# Patient Record
Sex: Male | Born: 1955
Health system: Southern US, Community
[De-identification: ages and names within clinical notes are randomized; demographics above are authoritative.]

## PROBLEM LIST (undated history)

## (undated) DIAGNOSIS — I6529 Occlusion and stenosis of unspecified carotid artery: Secondary | ICD-10-CM

## (undated) DIAGNOSIS — M1990 Unspecified osteoarthritis, unspecified site: Secondary | ICD-10-CM

## (undated) DIAGNOSIS — IMO0002 Reserved for concepts with insufficient information to code with codable children: Secondary | ICD-10-CM

## (undated) DIAGNOSIS — E43 Unspecified severe protein-calorie malnutrition: Secondary | ICD-10-CM

## (undated) DIAGNOSIS — E785 Hyperlipidemia, unspecified: Secondary | ICD-10-CM

## (undated) DIAGNOSIS — J189 Pneumonia, unspecified organism: Secondary | ICD-10-CM

## (undated) DIAGNOSIS — R011 Cardiac murmur, unspecified: Secondary | ICD-10-CM

## (undated) DIAGNOSIS — I2510 Atherosclerotic heart disease of native coronary artery without angina pectoris: Secondary | ICD-10-CM

## (undated) DIAGNOSIS — K259 Gastric ulcer, unspecified as acute or chronic, without hemorrhage or perforation: Secondary | ICD-10-CM

## (undated) DIAGNOSIS — J449 Chronic obstructive pulmonary disease, unspecified: Secondary | ICD-10-CM

## (undated) DIAGNOSIS — I10 Essential (primary) hypertension: Secondary | ICD-10-CM

## (undated) DIAGNOSIS — I739 Peripheral vascular disease, unspecified: Secondary | ICD-10-CM

## (undated) DIAGNOSIS — I509 Heart failure, unspecified: Secondary | ICD-10-CM

## (undated) DIAGNOSIS — Z720 Tobacco use: Secondary | ICD-10-CM

## (undated) DIAGNOSIS — F528 Other sexual dysfunction not due to a substance or known physiological condition: Secondary | ICD-10-CM

## (undated) DIAGNOSIS — J45909 Unspecified asthma, uncomplicated: Secondary | ICD-10-CM

## (undated) DIAGNOSIS — L89109 Pressure ulcer of unspecified part of back, unspecified stage: Secondary | ICD-10-CM

## (undated) DIAGNOSIS — I4891 Unspecified atrial fibrillation: Secondary | ICD-10-CM

## (undated) DIAGNOSIS — D649 Anemia, unspecified: Secondary | ICD-10-CM

## (undated) HISTORY — DX: Other sexual dysfunction not due to a substance or known physiological condition: F52.8

## (undated) HISTORY — DX: Essential (primary) hypertension: I10

## (undated) HISTORY — DX: Pressure ulcer of unspecified part of back, unspecified stage: L89.109

## (undated) HISTORY — DX: Hyperlipidemia, unspecified: E78.5

## (undated) HISTORY — DX: Anemia, unspecified: D64.9

## (undated) HISTORY — DX: Unspecified severe protein-calorie malnutrition: E43

## (undated) HISTORY — DX: Chronic obstructive pulmonary disease, unspecified: J44.9

## (undated) HISTORY — DX: Unspecified atrial fibrillation: I48.91

## (undated) HISTORY — PX: ACNE CYST REMOVAL: SUR1112

## (undated) HISTORY — DX: Peripheral vascular disease, unspecified: I73.9

## (undated) HISTORY — DX: Tobacco use: Z72.0

## (undated) HISTORY — DX: Occlusion and stenosis of unspecified carotid artery: I65.29

## (undated) HISTORY — DX: Unspecified asthma, uncomplicated: J45.909

## (undated) HISTORY — DX: Reserved for concepts with insufficient information to code with codable children: IMO0002

## (undated) HISTORY — DX: Gastric ulcer, unspecified as acute or chronic, without hemorrhage or perforation: K25.9

## (undated) HISTORY — DX: Atherosclerotic heart disease of native coronary artery without angina pectoris: I25.10

---

## 1998-07-28 DIAGNOSIS — J189 Pneumonia, unspecified organism: Secondary | ICD-10-CM

## 1998-07-28 HISTORY — DX: Pneumonia, unspecified organism: J18.9

## 2000-01-12 ENCOUNTER — Encounter (INDEPENDENT_AMBULATORY_CARE_PROVIDER_SITE_OTHER): Payer: Self-pay

## 2000-01-12 ENCOUNTER — Inpatient Hospital Stay (HOSPITAL_COMMUNITY): Admission: EM | Admit: 2000-01-12 | Discharge: 2000-01-31 | Payer: Self-pay | Admitting: *Deleted

## 2000-01-13 ENCOUNTER — Encounter: Payer: Self-pay | Admitting: Pulmonary Disease

## 2000-01-14 ENCOUNTER — Encounter: Payer: Self-pay | Admitting: Pulmonary Disease

## 2000-01-15 ENCOUNTER — Encounter: Payer: Self-pay | Admitting: Pulmonary Disease

## 2000-01-16 ENCOUNTER — Encounter: Payer: Self-pay | Admitting: Pulmonary Disease

## 2000-01-17 ENCOUNTER — Encounter: Payer: Self-pay | Admitting: Pulmonary Disease

## 2000-01-18 ENCOUNTER — Encounter: Payer: Self-pay | Admitting: Pulmonary Disease

## 2000-01-19 ENCOUNTER — Encounter: Payer: Self-pay | Admitting: Pulmonary Disease

## 2000-01-20 ENCOUNTER — Encounter: Payer: Self-pay | Admitting: Pulmonary Disease

## 2000-09-10 ENCOUNTER — Ambulatory Visit (HOSPITAL_BASED_OUTPATIENT_CLINIC_OR_DEPARTMENT_OTHER): Admission: RE | Admit: 2000-09-10 | Discharge: 2000-09-11 | Payer: Self-pay | Admitting: *Deleted

## 2000-09-10 ENCOUNTER — Encounter (INDEPENDENT_AMBULATORY_CARE_PROVIDER_SITE_OTHER): Payer: Self-pay | Admitting: *Deleted

## 2001-07-28 HISTORY — PX: FEMORAL ARTERY STENT: SHX1583

## 2002-01-25 ENCOUNTER — Ambulatory Visit (HOSPITAL_COMMUNITY): Admission: RE | Admit: 2002-01-25 | Discharge: 2002-01-26 | Payer: Self-pay | Admitting: *Deleted

## 2002-01-25 ENCOUNTER — Encounter: Payer: Self-pay | Admitting: *Deleted

## 2002-07-28 HISTORY — PX: NASAL SINUS SURGERY: SHX719

## 2005-07-16 ENCOUNTER — Ambulatory Visit: Payer: Self-pay | Admitting: Internal Medicine

## 2006-05-29 ENCOUNTER — Ambulatory Visit: Payer: Self-pay | Admitting: Internal Medicine

## 2006-07-09 ENCOUNTER — Ambulatory Visit: Payer: Self-pay | Admitting: Internal Medicine

## 2006-10-29 ENCOUNTER — Ambulatory Visit: Payer: Self-pay | Admitting: Internal Medicine

## 2006-11-11 ENCOUNTER — Ambulatory Visit: Payer: Self-pay | Admitting: Cardiovascular Disease

## 2006-11-20 ENCOUNTER — Ambulatory Visit: Payer: Self-pay

## 2006-11-30 ENCOUNTER — Ambulatory Visit: Payer: Self-pay | Admitting: Internal Medicine

## 2006-12-01 ENCOUNTER — Ambulatory Visit: Payer: Self-pay | Admitting: Cardiovascular Disease

## 2006-12-22 ENCOUNTER — Ambulatory Visit: Payer: Self-pay | Admitting: Cardiovascular Disease

## 2006-12-22 LAB — CONVERTED CEMR LAB
BUN: 19 mg/dL (ref 6–23)
Basophils Absolute: 0 10*3/uL (ref 0.0–0.1)
Calcium: 8.7 mg/dL (ref 8.4–10.5)
Chloride: 105 meq/L (ref 96–112)
Eosinophils Absolute: 0 10*3/uL (ref 0.0–0.6)
Eosinophils Relative: 0.9 % (ref 0.0–5.0)
GFR calc Af Amer: 75 mL/min
GFR calc non Af Amer: 62 mL/min
MCV: 99.1 fL (ref 78.0–100.0)
Monocytes Relative: 10.5 % (ref 3.0–11.0)
Neutro Abs: 3.2 10*3/uL (ref 1.4–7.7)
Platelets: 174 10*3/uL (ref 150–400)
RBC: 4.48 M/uL (ref 4.22–5.81)
WBC: 5 10*3/uL (ref 4.5–10.5)
aPTT: 25.2 s — ABNORMAL LOW (ref 26.5–36.5)

## 2006-12-24 ENCOUNTER — Observation Stay (HOSPITAL_COMMUNITY): Admission: RE | Admit: 2006-12-24 | Discharge: 2006-12-25 | Payer: Self-pay | Admitting: Cardiovascular Disease

## 2006-12-24 ENCOUNTER — Ambulatory Visit: Payer: Self-pay | Admitting: Cardiovascular Disease

## 2006-12-24 ENCOUNTER — Inpatient Hospital Stay (HOSPITAL_BASED_OUTPATIENT_CLINIC_OR_DEPARTMENT_OTHER): Admission: RE | Admit: 2006-12-24 | Discharge: 2006-12-24 | Payer: Self-pay | Admitting: Cardiovascular Disease

## 2007-01-01 ENCOUNTER — Ambulatory Visit: Payer: Self-pay | Admitting: Internal Medicine

## 2007-11-27 ENCOUNTER — Encounter: Payer: Self-pay | Admitting: Internal Medicine

## 2007-11-27 DIAGNOSIS — E785 Hyperlipidemia, unspecified: Secondary | ICD-10-CM

## 2007-11-27 DIAGNOSIS — J449 Chronic obstructive pulmonary disease, unspecified: Secondary | ICD-10-CM

## 2007-11-27 DIAGNOSIS — J4489 Other specified chronic obstructive pulmonary disease: Secondary | ICD-10-CM | POA: Insufficient documentation

## 2007-11-27 DIAGNOSIS — F1020 Alcohol dependence, uncomplicated: Secondary | ICD-10-CM

## 2007-11-27 DIAGNOSIS — F528 Other sexual dysfunction not due to a substance or known physiological condition: Secondary | ICD-10-CM

## 2007-11-27 HISTORY — DX: Other specified chronic obstructive pulmonary disease: J44.89

## 2007-11-27 HISTORY — DX: Hyperlipidemia, unspecified: E78.5

## 2007-11-27 HISTORY — DX: Chronic obstructive pulmonary disease, unspecified: J44.9

## 2007-11-27 HISTORY — DX: Other sexual dysfunction not due to a substance or known physiological condition: F52.8

## 2009-05-23 ENCOUNTER — Ambulatory Visit: Payer: Self-pay | Admitting: Internal Medicine

## 2009-05-23 DIAGNOSIS — I10 Essential (primary) hypertension: Secondary | ICD-10-CM

## 2009-05-23 HISTORY — DX: Essential (primary) hypertension: I10

## 2009-05-24 ENCOUNTER — Telehealth: Payer: Self-pay | Admitting: Internal Medicine

## 2010-08-22 ENCOUNTER — Inpatient Hospital Stay (HOSPITAL_COMMUNITY)
Admission: EM | Admit: 2010-08-22 | Discharge: 2010-08-25 | Payer: Self-pay | Source: Home / Self Care | Admitting: Pediatrics

## 2010-08-22 LAB — RAPID URINE DRUG SCREEN, HOSP PERFORMED
Amphetamines: NOT DETECTED
Benzodiazepines: NOT DETECTED
Cocaine: NOT DETECTED
Tetrahydrocannabinol: POSITIVE — AB

## 2010-08-22 LAB — LIPID PANEL
HDL: 56 mg/dL (ref >39)
LDL Cholesterol: 67 mg/dL (ref 0–99)
Total CHOL/HDL Ratio: 2.4 RATIO
Triglycerides: 53 mg/dL (ref <150)
VLDL: 11 mg/dL (ref 0–40)

## 2010-08-22 LAB — POCT CARDIAC MARKERS: Myoglobin, poc: 79.4 ng/mL (ref 12–200)

## 2010-08-22 LAB — BASIC METABOLIC PANEL
CO2: 23 mEq/L (ref 19–32)
Calcium: 8.5 mg/dL (ref 8.4–10.5)
Glucose, Bld: 220 mg/dL — ABNORMAL HIGH (ref 70–99)
Potassium: 3.7 mEq/L (ref 3.5–5.1)
Sodium: 137 mEq/L (ref 135–145)

## 2010-08-22 LAB — POCT I-STAT 3, VENOUS BLOOD GAS (G3P V)
Acid-base deficit: 1 mmol/L (ref 0.0–2.0)
Bicarbonate: 28.9 mEq/L — ABNORMAL HIGH (ref 20.0–24.0)
O2 Saturation: 86 %
TCO2: 31 mmol/L (ref 0–100)

## 2010-08-22 LAB — CBC
HCT: 54.3 % — ABNORMAL HIGH (ref 39.0–52.0)
MCHC: 32.4 g/dL (ref 30.0–36.0)
RDW: 14.2 % (ref 11.5–15.5)

## 2010-08-22 LAB — DIFFERENTIAL
Basophils Absolute: 0.1 10*3/uL (ref 0.0–0.1)
Basophils Relative: 0 % (ref 0–1)
Eosinophils Relative: 0 % (ref 0–5)
Lymphocytes Relative: 13 % (ref 12–46)
Monocytes Absolute: 1.2 10*3/uL — ABNORMAL HIGH (ref 0.1–1.0)

## 2010-08-23 LAB — CARDIAC PANEL(CRET KIN+CKTOT+MB+TROPI)
CK, MB: 6.1 ng/mL (ref 0.3–4.0)
Relative Index: INVALID (ref 0.0–2.5)
Relative Index: INVALID (ref 0.0–2.5)
Total CK: 71 U/L (ref 7–232)
Troponin I: 0.05 ng/mL (ref 0.00–0.06)

## 2010-08-23 LAB — HEPATIC FUNCTION PANEL
Alkaline Phosphatase: 55 U/L (ref 39–117)
Indirect Bilirubin: 0.9 mg/dL (ref 0.3–0.9)
Total Bilirubin: 1.2 mg/dL (ref 0.3–1.2)

## 2010-08-23 LAB — BASIC METABOLIC PANEL
BUN: 14 mg/dL (ref 6–23)
CO2: 37 mEq/L — ABNORMAL HIGH (ref 19–32)
Calcium: 8.6 mg/dL (ref 8.4–10.5)
Creatinine, Ser: 1.18 mg/dL (ref 0.4–1.5)
GFR calc Af Amer: >60 mL/min (ref >60)

## 2010-08-23 LAB — CBC
HCT: 46.8 % (ref 39.0–52.0)
Hemoglobin: 14.9 g/dL (ref 13.0–17.0)
MCH: 32.6 pg (ref 26.0–34.0)
MCHC: 31.8 g/dL (ref 30.0–36.0)

## 2010-08-23 LAB — GLUCOSE, CAPILLARY: Glucose-Capillary: 75 mg/dL (ref 70–99)

## 2010-08-23 LAB — HEMOGLOBIN A1C: Hgb A1c MFr Bld: 5.1 % (ref <5.7)

## 2010-08-23 NOTE — H&P (Addendum)
[name], [name]              ACCOUNT NO.:  [id number]  MEDICAL RECORD NO.:  [id number]          PATIENT TYPE:  INP  LOCATION:  2903                         FACILITY:  [facility]  PHYSICIAN:  Arturo Morton. Riley Kill, MD, FACCDATE OF BIRTH:  08/30/1955  DATE OF ADMISSION:  08/22/2010 DATE OF DISCHARGE:                             HISTORY & PHYSICAL   PRIMARY CARDIOLOGIST:  [name]. Excell Seltzer, MD  PRIMARY MEDICAL DOCTOR:  The patient does not have one, but sees Dr. Sherene Sires for his pulmonary issues.  CHIEF COMPLAINT:  Shortness of breath.  HISTORY OF PRESENT ILLNESS:  Mr. Edwin [name] is a 55 year old gentleman with a history of hypertension, hyperlipidemia, and coronary artery disease who has had no cardiac followup since 2008. He also has shown noncompliance with his medications secondary to monetary reasons.  He presented with acute shortness of breath and diaphoresis today.  Within EMS, blood pressure was over 200/100 and he was given an inhaler, Lasix 40 mg IV x1, and transported to [facility]. He was on BiPAP on arrival.  He received additional 40 mg IV here in the ER and has since put out pretty a lot of urine per the patient.  No measured amount available.  He is also on a nitro drip with some improvement in blood pressure, but the pressure is still 160s/100s.  He denies any chest pain, whatsoever.  His breathing has gotten somewhat better with his urine output and he is now on 2 liters of oxygen.  He is also wheezing. He used to use Xopenex and Advair, but has not in quite a while.  He has an elevated white blood cell count at 15.2.  Cardiac enzymes were negative x1 and BNP was 84.  Chest x-ray consistent with pulmonary edema.  PAST MEDICAL HISTORY: 1. Coronary artery disease with severe left circumflex stenosis,     status post drug-eluting stent placement in May 2008.  He had     nonobstructive disease otherwise in the RCA and LAD. 2. Hyperlipidemia. 3. Hypertension. 4.  Alcohol use. 5. CBD, status post SFA stenting on the right side approximately in     2003. 6. COPD.  MEDICATIONS:  The patient was not taking any medications prior to admission.  He is on a nitro drip here and was given 40 mg IV Lasix x2.  ALLERGIES:  No known drug allergies.  SOCIAL HISTORY:  Mr. [name] lives with his girlfriend.  He runs a guillotine cutter cutting paper.  He endorses an ongoing tobacco abuse history with 1 to 1-1/2 packs per day.  He endorses drinking 3-4 12- ounce beers per day after work.  FAMILY HISTORY:  He is adopted.  REVIEW OF SYSTEMS:  No fevers or chills.  Positive for shortness of breath.  Negative for chest pain.  All other systems are reviewed and otherwise are negative.  LABORATORY DATA:  WBC 15.2, hemoglobin 17.6, hematocrit 54.3, and platelet count 214.  Sodium 137, potassium 3.3, chloride 102, CO2 of 22, glucose 220, BUN 9, and creatinine 1.13.  Cardiac enzymes negative x1. BNP 84.  Chest x-ray showed CHF with interstitial pulmonary edema.  EKG showed  sinus tachycardia with a rate of 132 beats per minute with nonspecific ST-T changes.  PHYSICAL EXAMINATION:  VITAL SIGNS:  Pulse 92, respirations 22, blood pressure 163/108, and pulse ox 94% on 4 liters per minute. GENERAL:  This is a slightly shaky white male, somewhat unkempt looking, in no acute distress. HEENT:  Normocephalic and atraumatic with extraocular movements intact. There is slight exophthalmos. NECK:  Supple without lymphadenopathy or carotid bruit. HEART:  Auscultation of the heart reveals regular rate and rhythm with S1 and S2.  He has a positive S4. LUNGS:  No rales.  He has rhonchi as well as respiratory wheezes bilaterally. ABDOMEN:  Soft, nontender, and nondistended with positive bowel sounds. He has no organomegaly or splenomegaly. EXTREMITIES:  Warm and dry and they are slightly hyperpigmented with trace lower extremity edema. NEUROLOGIC:  The patient is alert and  oriented x3.  He responses questions appropriately with a normal affect.  He is slightly shaky as noted above.  ASSESSMENT/PLAN:  The patient was seen and examined by Dr. Riley Kill and myself.  This is a 55 year old gentleman with history of hypertension, hyperlipidemia, COPD, and coronary artery disease who presents with acute pulmonary edema and shortness of breath.  He has since improved with 40 mg IV b.i.d. of Lasix x2.  Interestingly, his BNP was low on admission at 84, but chest x-ray was consistent with interstitial pulmonary edema.  He is now saturating on 4 liters of oxygen and is much more comfortable.  He has ongoing tobacco abuse as well as ongoing alcohol use.  At this time, we will admit the patient to the hospital and check lab work including LFTs, TSH, free C4, urine drug screen, and hemoglobin A1c given that his blood sugar was 220 on admission to the hospital.  This may be related to his acute disease, but he may be a new diabetic as well.  He notes that he has not visited a doctor in some time.  We will initiate blood pressure control with continuation of his nitro drip, start amlodipine, and start low-dose metoprolol tartrate 25 mg p.o. b.i.d. with careful attention to his respiratory status given his COPD.  We will start him on Advair that he was previously taking as well as continue Lasix 40 mg IV b.i.d. for his CHF. He will be given Xopenex nebs for his wheezing every 6 hours.  He will be admitted to the CCU 2900 with attention to a strict I's, O's, and daily weight.  We will plan for further diuresis as kidney function allows. We will also initiate CIWA protocol for prevention of alcohol withdrawal given his history of regular excessive alcohol use.     Dayna Dunn, P.A.C.   ______________________________ Arturo Morton Riley Kill, MD, [facility]    DD/MEDQ  D:  08/22/2010  T:  08/23/2010  Job:  [id number]  cc:   [name]. Excell Seltzer, MD Charlaine Dalton. Sherene Sires, MD,  [facility]  Electronically Signed by Ronie Spies  on 08/23/2010 10:43:13 AM Electronically Signed by Shawnie Pons MD [facility] on 09/18/2010 09:08:55 PM

## 2010-08-24 LAB — BASIC METABOLIC PANEL
Calcium: 9 mg/dL (ref 8.4–10.5)
GFR calc Af Amer: >60 mL/min (ref >60)
GFR calc non Af Amer: >60 mL/min (ref >60)
Potassium: 3.2 mEq/L — ABNORMAL LOW (ref 3.5–5.1)
Sodium: 140 mEq/L (ref 135–145)

## 2010-08-24 LAB — BRAIN NATRIURETIC PEPTIDE: Pro B Natriuretic peptide (BNP): 606 pg/mL — ABNORMAL HIGH (ref 0.0–100.0)

## 2010-08-25 LAB — BASIC METABOLIC PANEL
Calcium: 9.2 mg/dL (ref 8.4–10.5)
GFR calc non Af Amer: >60 mL/min (ref >60)
Glucose, Bld: 97 mg/dL (ref 70–99)
Potassium: 3.7 mEq/L (ref 3.5–5.1)
Sodium: 139 mEq/L (ref 135–145)

## 2010-08-25 LAB — BRAIN NATRIURETIC PEPTIDE: Pro B Natriuretic peptide (BNP): 322 pg/mL — ABNORMAL HIGH (ref 0.0–100.0)

## 2010-08-27 ENCOUNTER — Telehealth (INDEPENDENT_AMBULATORY_CARE_PROVIDER_SITE_OTHER): Payer: Self-pay | Admitting: *Deleted

## 2010-09-04 NOTE — Progress Notes (Addendum)
Summary: NEEDS OV-lmtcbx 1  ---- Converted from flag ---- ---- 08/26/2010 2:00 PM, Nyoka Cowden MD wrote: needs ov asap with all meds in hand ------------------------------  lmomtcb Vernie Murders  August 30, 2010 11:07 AM  Appended Document: NEEDS OV-lmtcbx 1 lmomtcb  Appended Document: NEEDS OV-lmtcbx 1 lmomtcb  Appended Document: NEEDS OV-lmtcbx 1 [email or web address]  Appended Document: NEEDS OV-lmtcbx 1 Letter mailed

## 2010-09-16 ENCOUNTER — Encounter (INDEPENDENT_AMBULATORY_CARE_PROVIDER_SITE_OTHER): Payer: Self-pay | Admitting: *Deleted

## 2010-09-16 NOTE — Discharge Summary (Signed)
NAMESEDERICK, Edwin [name]              ACCOUNT NO.:  [id number]  MEDICAL RECORD NO.:  [id number]          PATIENT TYPE:  INP  LOCATION:  2034                         FACILITY:  [facility]  PHYSICIAN:  Duke Salvia, MD, FACCDATE OF BIRTH:  17-Oct-1955  DATE OF ADMISSION:  08/22/2010 DATE OF DISCHARGE:  08/25/2010                              DISCHARGE SUMMARY   PROCEDURES: 1. Two-view chest x-ray. 2. A 2-D echocardiogram.  PRIMARY FINAL DISCHARGE DIAGNOSES: 1. Dyspnea. 2. Hypertensive urgency. 3. Acute systolic and diastolic congestive heart failure.  SECONDARY DIAGNOSES: 1. Acute coronary syndrome in 2008 with drug-eluting stent to the     circumflex. 2. History of cerebrovascular disease and peripheral vascular disease     with a stent to the right SFA in 2003 and a history of significant     left internal carotid stenosis of 60-79% in 2008. 3. Tobacco use. 4. Ethyl alcohol use. 5. Hyperlipidemia.6. Allergy or intolerance to codeine. 7. History of nasal surgery. 8. Chronic obstructive pulmonary disease and asthmatic bronchitis. 9. History of admission in July 2001 for acute respiratory failure     secondary to pneumonia requiring intubation as well as tobacco and     ethyl alcohol abuse with delirium tremens, toxic metabolic     encephalopathy, acute renal insufficiency, and ileus. 10.History of noncompliance with medication.  TIME AT DISCHARGE:  36 minutes.  HOSPITAL COURSE:  Edwin [name] is a 55 year old male with a history of coronary artery disease.  He had shortness of breath and came to the hospital where he was admitted for further evaluation and treatment.  He was not taking any medications prior to admission.  He was started on nitroglycerin and given 80 mg of IV Lasix.  His initial blood pressure was 208/139.  His PO2 was 62.  Chest x-ray showed CHF and BNP was over 2000.  Edwin [name] diuresed well over his hospital stay and was changed to p.o. medications as his  condition improved.  He lost approximately 4 kg.  At discharge, his O2 saturation was 93% on room air.  The chest x- ray on August 24, 2010, showed resolved pulmonary edema with small bilateral effusions and cardiomegaly.  An echocardiogram showed an EF of 45-50% with mild LVH.  As his blood pressure control improved, he was weaned off the nitroglycerin.  He had some elevation in his cardiac enzymes with a peak MB of 7.4; however, troponin remained negative at less than or equal to 0.06.  A lipid profile showed an HDL of 56 and LDL of 67.  Thyroid function tests were within normal limits and a urine drug screen was only positive for opiates and THC.  Initially, his white count was 15,000, but he was not placed on antibiotics as no infectious source was noted.  By August 25, 2010, his BNP had improved to 322.  His renal function was followed closely during his hospital stay and potassium supplementation was used p.r.n.  At discharge, his potassium was 3.7 with a BUN and creatinine of 15/1.08.  On August 25, 2010, Edwin [name] was evaluated by Dr. Graciela Husbands.  Dr. Graciela Husbands was bothered by the  pattern (temporal) of the CK-MB elevation especially given the lack of alternative trigger.  However, his story suggests that there was an issue of progressive hypertension/CHF secondary to discontinuation of meds.  He is therefore considered stable for discharge to follow up as an outpatient.  DISCHARGE INSTRUCTIONS: 1. His activity level is to be increased gradually. 2. He is encouraged to stick to a low-sodium heart-healthy diet. 3. He is advised not to use tobacco or alcohol. 4. He is to follow up with Dr. Sherene Sires, he is to follow up with Dr.     Excell Seltzer as well as a stress test and we will call him with these     appointments.  DISCHARGE MEDICATIONS: 1. Lisinopril 20 mg daily. 2. Nicotine patch 21 mg daily. 3. Decrease the dose of the patch every 2 weeks. 4. Metoprolol 25 mg b.i.d. 5. Amlodipine  10 mg daily. 6. Clonidine 0.2 mg is discontinued. 7. Lasix 40 mg p.o. b.i.d. 8. Aspirin 325 mg daily. 9. Potassium 20 mEq daily. 10.Advair 1 puff b.i.d.     Theodore Demark, PA-C   ______________________________ Duke Salvia, MD, [facility]    RB/MEDQ  D:  08/25/2010  T:  08/26/2010  Job:  [id number]  cc:   Charlaine Dalton. Sherene Sires, MD, [facility]  Electronically Signed by Theodore Demark PA-C on 09/02/2010 12:50:32 PM Electronically Signed by Sherryl Manges MD [facility] on 09/16/2010 09:55:04 PM

## 2010-09-19 ENCOUNTER — Encounter: Payer: Self-pay | Admitting: Internal Medicine

## 2010-09-24 NOTE — Letter (Signed)
Summary: Appointment - Reschedule  [facility], Main Office  [address]   [place], [place] [address]   Phone: [phone]  Fax: [phone]     September 16, 2010 MRN: [id number]   Edwin [name] [address] [place], [place]  [address]   Dear [name],   Since your last hospital stay we have been trying to contact you by phone to schedule your appointment  for a stress test and an appointment to see Dr Excell Seltzer.  It is very important that we reach you to schedule these appointments. We look forward to participating in your health care needs. Please contact us at the number listed above at your earliest convenience to schedule these appointments.     Sincerely,  Glass blower/designer

## 2010-09-24 NOTE — Letter (Signed)
Summary: [facility]  [address]   [place], [place] [address]   Phone: [phone]  Fax: [phone]    09/19/2010  Edwin [name] [address] [place], [place]  [address]  Dear Mr. Leclere,   Our records indicate that you are now due for an appointment with Dr Sandrea Hughs.  Please call our office at [phone] to schedule this appointment. Thank You.        Sincerely,   [facility]

## 2010-12-10 NOTE — Cardiovascular Report (Signed)
NAMETADAN, SHILL              ACCOUNT NO.:  [id number]   MEDICAL RECORD NO.:  [id number]          PATIENT TYPE:  OIB   LOCATION:  1961                         FACILITY:  [facility]   PHYSICIAN:  [name]. Excell Seltzer, MD  DATE OF BIRTH:  02/13/1956   DATE OF PROCEDURE:  12/24/2006  DATE OF DISCHARGE:                            CARDIAC CATHETERIZATION   PROCEDURE:  Left heart catheterization, selective coronary angiography,  left ventricular angiography.   INDICATIONS:  This is a 55 year old gentleman with known peripheral  arterial disease.  He has had a prior right superficial femoral artery  stent several years back.  He has multiple cardiac risk factors  including hypertension, tobacco abuse and dyslipidemia.  He has had  increasing chest pain over the past several months.  This pain occurs  with exertion and at rest.  In that setting he was referred for cardiac  catheterization.   Risks and indications of procedure were explained to the patient in  detail.  Informed consent was obtained.  The right groin was prepped,  draped and anesthetized with 1% lidocaine.  Using modified Seldinger  technique a 4-French sheath was placed in the right femoral artery.  Multiple views of the left and right coronary arteries were taken with  standard 4-French Judkins catheters.  Following selective coronary  angiography an angled pigtail catheter was inserted in the left  ventricle and pressures were recorded.  A left ventriculogram was  performed.  A pullback across the aortic valve was done.   FINDINGS:  Aortic pressure 155/89 with a mean of 117, left ventricular  pressure is 155/8 with an end-diastolic pressure of 21.   Coronary angiography.  The left mainstem is angiographically normal.  It  bifurcates into the LAD and left circumflex.   The LAD is a large-caliber vessel that courses down to the left  ventricular apex and wraps around the apex.  The proximal LAD is large-  caliber and just  beyond the first septal perforator there is an area of  30% tubular stenosis.  The stenosis is smooth and very mild in nature.  The remaining mid and distal portions of the LAD have no significant  angiographic disease.  There are three diagonal branches from the LAD.  All are small caliber.  There is no significant angiographic disease in  the diagonal branches.   The left circumflex is a large-caliber vessel.  The proximal circumflex  has a severe irregular 95%  to 99% stenosis just proximal to the  bifurcation of the first OM branch.  The first OM branch is a large  vessel that has mild nonobstructive plaque in its midportion and no  other significant angiographic disease.  The AV groove circumflex  continues just beyond the area of tight stenosis and is a medium-size  vessel just before it bifurcates into the true AV groove circumflex and  the second OM branch.  Both of those branch vessels are very small.   The right coronary artery is dominant.  The vessel bifurcates distally  into a large PDA branch as well as a large posterior AV segment that  gives off one large posterolateral branch that courses all the way to  the left ventricular apex.  The PDA branch has no significant  angiographic disease.  The posterolateral branch has a 40% focal  stenosis.  The right coronary artery has nonobstructive disease  throughout.  There are 30% lesions in the proximal and mid vessel and  irregularities throughout.  There is no significant flow obstructing  lesions of the right coronary artery.   Left ventricular function is normal by left ventriculography.  The  estimated LVEF is 60 to 65%.  There is no mitral regurgitation.   ASSESSMENT:  1. Severe left circumflex stenosis.  2. Nonobstructive right coronary artery and LAD disease.  3. Normal left ventricular systolic function.   PLAN:  In the setting of Mr. Fayson increasing symptoms that include  rest pain and his severe stenosis of  the left circumflex with some high-  risk angiographic features of severe luminal irregularities, I think we  should perform an intervention on the left circumflex without delay.  The sheath will be left in and he will be transferred up to the  inpatient catheterization lab for planned PCI of the left circumflex.  He will be preloaded with 600 mg of clopidogrel and we will plan on  using Angiomax for his periprocedural anticoagulation.      [name]. Excell Seltzer, MD  Electronically Signed     MDC/MEDQ  D:  12/24/2006  T:  12/24/2006  Job:  [id number]   cc:   Charlaine Dalton. Sherene Sires, MD, FCCP

## 2010-12-10 NOTE — Assessment & Plan Note (Signed)
[facility]                             PULMONARY OFFICE NOTE   NAME:Edwin [name], Edwin [name]                     MRN:          [id number]  DATE:01/01/2007                            DOB:          [birth year]    HISTORY:  A 55 year old white male, active drinker and smoker, with  poorly controlled hypertension and already documented peripheral  vascular disease for which he is on Crestor 10 mg a day and an aspirin  daily. He now returns having developed exertional chest pain with left  heart catheterization performed on May 28 with a eluding stent of the  left circumflex. He had an ejection fraction of 52% and also 60% to 79%  stenosis of the left internal carotid artery.   Despite all these issues he is still having trouble keeping track of his  medicines and has not followed my instructions regarding cutting down on  cigarettes and/or alcohol.   He denies having any recurrent exertional chest pain, significant  limiting dyspnea, orthopnea, PND, leg swelling, TIA or claudication  symptoms.   For inventory of medications please see face sheet column dated January 01, 2007 which is correct as listed. Note that the patient never got the  prescription for Imdur filled as I recommended but does have p.r.n.  nitroglycerine which he never took either.   On physical examination, he is a pleasant ambulatory white male in no  acute distress. He is afebrile, stable vital signs with a blood pressure  of 140/82.  HEENT: Unremarkable.  LUNG FIELDS: Reveal inspiratory and expiratory junky rhonchi  bilaterally.  There is a regular rate and rhythm without murmur, gallop, or rub.  ABDOMEN: Soft, benign.  EXTREMITIES: Warm without calf tenderness, cyanosis, clubbing, or  edema.   IMPRESSION:  1. Hypertension is marginally controlled on his present regimen which      consists of a combination of clonidine ( to help with alcohol and      cigarette withdrawal) and  Bystolic and Exforge. I suspect the      reason his blood pressure is so difficult to manage is because of      his alcoholism and strongly recommended that he cut down if not      quit.  2. Chronic obstructive pulmonary disease with poorly controlled      asthmatic bronchitis despite Advair at 250/50 b.i.d. I strongly      suspect this is due to continued smoking against medical advice.   I had an extended discussion with this patient lasting 15 to 20 minute  in a 25 minute visit regarding both his medications (noting that he was  not consistently taking the medications that are already listed) and he  lifestyle choices (which includes continued abuse of both alcohol and  cigarettes). I did not get a firm commitment from him regarding  abstinence from alcohol or cigarettes but he did agree to at least  return for medication reconciliation purposes in 3 months and we will  see him 3 months after that for comprehensive health care elevation.   In terms of his hyperlipidemia management,  he tells me that Dr. Excell Seltzer  is now managing this and will be adjusting this. I understand the target  LDL will be at less than 80 and would be happy to monitor this here in  the office if Dr. Excell Seltzer prefers, although I note the patient will be  returning to cardiology regularly since he is relatively high risk with  stent placed in May of this year.     Charlaine Dalton. Sherene Sires, MD, [facility]  Electronically Signed    MBW/MedQ  DD: 01/01/2007  DT: 01/01/2007  Job #: [id number]   cc:   Dr. Excell Seltzer

## 2010-12-10 NOTE — Discharge Summary (Signed)
NAMEMARKESE, BLOXHAM              ACCOUNT NO.:  [id number]   MEDICAL RECORD NO.:  [id number]          PATIENT TYPE:  OBV   LOCATION:  6524                         FACILITY:  [facility]   PHYSICIAN:  [name]. Excell Seltzer, MD  DATE OF BIRTH:  [birth year]   DATE OF ADMISSION:  12/24/2006  DATE OF DISCHARGE:                         DISCHARGE SUMMARY - REFERRING   DISCHARGE DIAGNOSES:  1. Acute coronary syndrome with positive adenosine Myoview.  2. Coronary artery disease.  3. Status post drug eluting stent to the circumflex with placement in      the study.  4. Hypertension.  5. Cerebrovascular disease with recent abnormal Doppler.  6. Tobacco use.  7. Alcohol use.  8. Peripheral vascular disease as described above.  9. Hyperlipidemia.   PROCEDURES PERFORMED:  Cardiac catheterization Dec 24, 2006 with drug  eluting stenting to the circumflex by Dr. Excell Seltzer on Dec 23, 2006.   SUMMARY OF HISTORY:  Mr. Basu is a 55 year old white male who was  initially seen in mid April for consultation of his chest discomfort.  He underwent an adenosine Myoview on November 20, 2006 and was found to be  abnormal with an EF of 52%, mild ischemia in the basal lateral wall.  Carotid ultrasound showed a 60-79% stenosis in the left internal carotid  artery.  His medical history is notable for tobacco use, hyperlipidemia,  hypertension, peripheral vascular disease with prior stenting of the  right SFA, COPD with asthmatic bronchitic component and prior sinus  surgery.   LABORATORY DATA:  Admission H and H is 15.7 and 44.4, normal indices,  platelets 174, WBCs 5.0.  Prior to discharge, H and H was 16.8 and 48.3,  normal indices, platelets 144, WBCs 6.0.  Admission PTT was 25.2, PT  10.6, sodium 144, potassium 4.2, BUN 19, creatinine 1.3.  Prior to  discharge, sodium was 136, potassium 3.5, BUN 12, creatinine 1.08,  glucose 107.  CK total was 103 with an MB of 3.9, troponin 0.11 post  procedure.  EKG showed  normal sinus rhythm, left axis deviation,  nonspecific ST-T wave changes.   HOSPITAL COURSE:  Mr. Sagan was brought in for outpatient cardiac  catheterization.  This was performed on Dec 24, 2006 by Dr. Excell Seltzer.  This revealed a 30% proximal LAD, 95% proximal circumflex, 30% proximal  RCA, 30% distal RCA, 40% PDA, EF of 60%.  After review, Dr. Excell Seltzer  performed Taxus drug eluting stenting to the left circumflex without  difficulty.  He recommended aspirin and Plavix at least for 12 months.  Research team placed him in the __________ study.  Post sheath removal  and bedrest, the patient was ambulating without difficulty.  Catheterization site was intact.  By Dec 25, 2006 he was feeling much  better.  It was felt that the patient could be discharged home.   DISPOSITION:  The patient is discharged home.  He was given permission  to return to work on December 28, 2006.  Asked to maintain a low sodium,  Heart Healthy Diet.  Wound care and activities are per supplemental  discharge sheet.  New medications include Plavix 75  mg daily and  nitroglycerin 0.4 as needed.  He was asked to continue aspirin 325 mg  daily, Exforge 5/160 mg daily, diazepam 5 mg daily, Advair 250/50  b.i.d., Bystolic 10 mg daily, clonidine 0.2 daily, Crestor 10 mg daily.  He was advised no smoking or tobacco products, to limit his alcohol  intake to one to two beers per day, to bring all medications and the  blood pressure diary to all appointments.  He will follow up with Dr.  Excell Seltzer in the office within the next couple of weeks.  I will make this  appointment when the office opens prior to the patient's discharge.  He  is also asked to follow up with Dr. Sherene Sires as needed.  Discharge time 25  minutes.      Joellyn Rued, PA-C      [name]. Excell Seltzer, MD  Electronically Signed    EW/MEDQ  D:  12/25/2006  T:  12/25/2006  Job:  [id number]   cc:   Charlaine Dalton. Sherene Sires, MD, FCCP

## 2010-12-10 NOTE — Cardiovascular Report (Signed)
NAMECASEN, Edwin [name]              ACCOUNT NO.:  [id number]   MEDICAL RECORD NO.:  [id number]          PATIENT TYPE:  OIB   LOCATION:  1961                         FACILITY:  [facility]   PHYSICIAN:  [name]. Excell Seltzer, MD  DATE OF BIRTH:  11-Jan-1956   DATE OF PROCEDURE:  12/24/2006  DATE OF DISCHARGE:  12/24/2006                            CARDIAC CATHETERIZATION   PROCEDURE:  PTCA and stenting of the proximal left circumflex.   INDICATIONS:  Mr. Edwin [name] is a 55 year old gentleman who presented for  an outpatient cardiac catheterization today.  He has had increasing  chest pain at home.  He has multiple cardiac risk factors.  At the time  of his diagnostic angiogram, he was found to have nonobstructive disease  in the LAD and right coronary artery, and a very high-grade stenosis in  the left circumflex.  I elected to perform same-day PCI due to the high-  risk features of his left circumflex and increasing chest pain symptoms  at home.  He was pre-loaded with 600 mg of clopidogrel.   The risks and indications of the procedure were reviewed with the  patient.  Under normal sterile conditions, the 4-French sheath was  changed out over a guidewire to a 6-French sheath.  Angiomax was used  for anticoagulation.  Once a therapeutic ACT was achieved, a 6-French  Xb3/5 guide catheter was inserted and initial angiographic images were  taken.  A Cougar guidewire was passed into a large OM branch.  The  lesion was pre-dilated with a 2.5 x 15-mm Maverick balloon to 10  atmospheres.  Following pre-dilatation, the lesion appeared to be  approximately 16-to-18-mm in length.  I elected to stent with a 3-0 x 20-  mm TAXUS which was deployed at 18 atmospheres, taking that stent to  approximately 3.4-mm.  The stent appeared well sized and I elected to  post dilate it with a 3.5 x 15-mm Quantum Maverick which was inflated on  two serial inflations to 18 atmospheres.  At the conclusion of the  procedure,  there was excellent stent expansion with TIMI III flow  throughout the left circumflex.  There was minor stenosis present at the  mid left circumflex arising from the stented segment but this did not  appear obstructive and there was TIMI III flow in that vessel.  There is  a small marginal branch arising from the stented segment as well that  has an ostial post stent stenosis but there was also TIMI III flow in  that vessel.  Angiomax was discontinued and the sheath will be pulled in  2 hours.   CONCLUSION:  1. Successful percutaneous coronary intervention of the left      circumflex with a TAXUS drug-eluting stent.      a.     Pre stenosis 95%, post stenosis 0%.      b.     TIMI III flow both pre and post.   Recommend dual antiplatelet therapy for a minimum of 12 months.  The  patient will be monitored in the post PCI unit and likely be discharged  home tomorrow.  [name]. Excell Seltzer, MD  Electronically Signed    MDC/MEDQ  D:  12/24/2006  T:  12/24/2006  Job:  [id number]   cc:   Charlaine Dalton. Sherene Sires, MD, FCCP

## 2010-12-13 NOTE — Assessment & Plan Note (Signed)
 HEALTHCARE                             PULMONARY OFFICE NOTE   NAME:Brougham, CLELAND SIMKINS                     MRN:          [id number]  DATE:07/09/2006                            DOB:          01-23-1956    PRIMARY SERVICE/EXTENDED FOLLOW UP VISIT:   HISTORY:  A 55 year old white male with a history of heavy alcohol use  and smoking who returns for followup evaluation of hypertension and  asthma, having run out of Advair a week ago but having minimum symptoms.  That is, he denies any significant dyspnea with activity, fever, chills,  sweats, orthopnea, PND, or nocturnal complaints of cough, wheezing or  excess morning sputum production.   He continues to smoke up to a pack per day but is considering  quitting.   PHYSICAL EXAMINATION:  He is a pleasant ambulatory white man in no acute  distress.  Patient has stable signs.  HEENT:  Is unremarkable.  Pharynx is clear.  LUNGS:  Lung fields reveal minimal rhonchi bilaterally, air movement is  adequate (note that this exam was immediately after a bronchodilator).  There was a regular rhythm without murmur, gallop or rub.  ABDOMEN:  Soft, benign.  EXTREMITIES:  Warm, no calf tenderness, cyanosis, clubbing or edema.   PFTs indicate severe flow obstruction with marked improvement after  bronchodilator with normal diffusing capacity.   IMPRESSION:  Chronic obstructive pulmonary disease with a definite  asthmatic bronchitic component.  No evidence of emphysema yet by  diffusing capacity.  This gives him a great opportunity to commit to  quit smoking now and I discussed the options with him including the  natural history of chronic obstructive pulmonary disease in the active  smoker.  He agreed to at least consider Chantix and I gave him the  information plus a starter pack prescription with the recommendation  that he return at the end of the starter pack if he is actually able to  use it.  1. Hypertension  is adequately controlled on his present regimen      despite intermittent alcohol use which I have strongly discouraged.  2. Extended discussion with this patient lasted 20 minutes of his 25      minute visit on the following topics:   I did review each and everyone of his medicines and also made sure that  he could use Advair effectively using optimal DPI technique, which he  mastered fairly well.   Follow up will therefore be every 3 months, sooner if needed.     Charlaine Dalton. Sherene Sires, MD, [facility]  Electronically Signed    MBW/MedQ  DD: 07/09/2006  DT: 07/09/2006  Job #: [id number]

## 2010-12-13 NOTE — Op Note (Signed)
[place]  Patient:    Edwin [name], Edwin [name]                     MRN: [id number] Proc. Date: 09/10/00 Adm. Date:  [id number] Attending:  [name] CC:         [name], M.D. [facility]   Operative Report  PREOPERATIVE DIAGNOSES: 1. Large medial canthal and nasal dorsal and forehead cyst, 3 x 3 cm in    dimension. 2. Septal deviation. 3. Bilateral chronic ethmoid sinusitis. 4. Bilateral chronic maxillary sinusitis.  POSTOPERATIVE DIAGNOSES: 1. Large medial canthal and nasal dorsal and forehead cyst, 3 x 3 cm in    dimension. 2. Septal deviation. 3. Bilateral chronic ethmoid sinusitis. 4. Bilateral chronic maxillary sinusitis.  PROCEDURES: 1. Complex excision of left nasal dorsal, medial canthal, and forehead cyst    with complex closure. 2. Nasal septal reconstruction. 3. Bilateral endoscopic total ethmoidectomies, bilateral endoscopic maxillary    antrostomies using the InstaTrak system for stereotactic computer system    navigation.  SURGEON:  [name]. Arletha Grippe, M.D.  ASSISTANT:  [name]. [name], M.D.  ANESTHESIA:  General endotracheal.  INDICATION FOR SURGERY:  This is a 55 year old white male who has a long history involving a cyst involving the left nasal dorsal, medial canthal, and forehead area, who was initially evaluated in summer of 2001.  Fine needle aspiration biopsy of this did not reveal any fluid, so it was pretty much consistent with a sebaceous cyst as confirmed by CT scan of the head and midface.  He also has a history of nasal congestion and persistent sinusitis. After very aggressive medical therapy, including a prolonged course of antibiotics, nasal steroid sprays, mucolytics, and decongestants, a post-treatment CT scan of the sinuses revealed opacification of the left frontal sinus, left ethmoid sinus, left maxillary sinus, with minimal disease involving the right ethmoid and maxillary sinus.  Based on his  history and physical examination and failure of medical therapy, I have recommended proceeding with the above-noted surgical procedure.  I have discussed extensively with him and his family the risks and benefits from surgery, including risk of general anesthesia, infection, bleeding, orbital or CNS injury, poor cosmetic result, injury to the canthal drainage area, and the normal recovery period after this type of surgery.  I have entertained any questions, answered them appropriately.  Informed consent has been obtained. The patient presents for the above-noted procedure.  OPERATIVE FINDINGS:  Severe septal deviation to the right.  Mucosal thickening.  Fluid involving the left frontal recess, left ethmoid, and left maxillary sinus area, and minimal disease involving the ethmoid and maxillary sinus on the right side.  Large sebaceous cyst arising in the medial canthal area on the left side, which was excised and reconstructed in a complex fashion.  DESCRIPTION OF PROCEDURE:  The patient was brought in the operating room and placed in the supine position and general endotracheal anesthesia administered via the anesthesiologist without complications.  The area was addressed with the large cyst involving the forehead, medial canthal, and dorsal area.  The area was sterilely prepped and draped in standard fashion.  A total of 3 cc of a 1% lidocaine solution with 1:1000 epinephrine was infiltrated into the area and left in place for approximately 10 minutes.  Elliptical incision was made around this mass with limbs straight out up into the eyebrow area and down onto the junction between the cheek and nasal dorsum.  Both blunt and sharp  dissection were used to dissect the cyst cavity from the underlying tissue. This was dissected meticulously not to involve periosteum of the nasal dorsum or cheek area or not involving the lacrimal system.  Bleeding from any of the areas was controlled with  electrocautery without difficulty.  The cyst was then meticulously dissected from surrounding tissue and was sent to surgical pathology for permanent section analysis.  The area was irrigated with copious amounts of irrigation fluid and suctioned dry.  Bleeding was controlled with electrocautery without difficulty.  Deep layers of tissue were reapproximated with interrupted 4-0 Vicryl suture.  Subcutaneous tissues were reapproximated with interrupted 4-0 Vicryl suture, and the final skin closure was achieved with a running 5-0 nylon suture, and bacitracin ointment was placed over the patients wound.  Next, the attention was turned to the nasal cavity.  Cotton pledgets soaked in a 4% cocaine solution were placed in both nares, left in place for approximately five to 10 minutes and then removed.  Both sides of the septum were infiltrated with a 1% lidocaine solution with 1:1000 epinephrine.  After waiting approximately 10 minutes, a standard Killian incision was made on the left side of the septum.  Mucoperichondrial and mucoperiosteal flap was elevated on the left side using both blunt and sharp dissection.  An intercartilaginous incision was made approximately 1 cm posterior from the caudal end of the septum.  Mucoperichondrial and mucoperiosteal flap was elevated on the right side using both blunt and sharp dissection.  Cartilaginous deviation was removed with a Ballenger swivel knife.  Posterior bony deflection was removed with a Jansen-Middleton forceps, and the septal spur was removed using Takahashi forceps without difficulty. Next, using a 0 degree rigid endoscopic guidance, the anterior portion of both middle turbinates and uncinate processes were injected with 1% lidocaine solution with 1:100,000 epinephrine.  Cotton pledgets soaked in a 4% cocaine solution were placed in both middle meatuses and left in place for approximately 10 minutes and then removed.  The InstaTrak headpiece  was placed on the patients head per protocol and was calibrated to the straight suction  aspirator without difficulty.  This was used throughout the entire case to identify landmarks such as the frontal recess and also the lamina papyracea and skull base, which were not violated at any time.  Next, attention was turned to the left nasal chamber.  Using a 0 degree rigid endoscope, the uncinate process was back-elevated using the _____ elevator, and the uncinectomy was performed with a combination of through-cutting forceps and the microdebrider without difficulty.  Natural sinus ostium of the maxillary sinus was enlarged using through-cutting forceps and the microdebrider.  Large mucopurulent material was suctioned from this area using curved J suction catheters without difficulty.  A 30 and a 70 degree rigid endoscope was used to identify the contents of the maxillary sinuses.  No other lesions or masses were noted.  No further biopsies were taken.  Next, using the 0 degree endoscope, microdebrider, and the InstaTrak system for guidance, an anterior-posterior ethmoidectomy was performed in the anterior-posterior fashion not to violate skull base or lamina papyracea.  Significant disease was noted in the left ethmoid cavity, and copious pus was also suctioned from the frontal recess area.  Next, attention was turned to the right nasal chamber.  Identical procedure was carried out on this side compared with the left side with identical results; however, there was not as much disease involving the ethmoid and maxillary sinus on the right as there had been  on the left.  After this was done, a piece of trimmed morcellized cartilage was placed in between the septal flaps.  Septal incision was closed with interrupted chromic suture, and the septum was reinforced with a running transeptal plain gut mattress stitch.  Kennedy packs soaked in a Bactroban ointment solution were placed in both middle  meatuses without difficulty.  An orogastric tube was placed.  This was used to decompress the stomach contents. It was then removed without incident, and Doyle splints were placed on either side of the septum and held in place with a transseptal Prolene suture. Fluids given during the procedure:  Approximately 1800 cc of crystalloid. Estimated blood loss was less than 50 cc.  Urine output was not measured. There were no drains.  Please note that two high Kennedy packs and two Doyle splints were placed.  Specimens sent were septal cartilage and bone for gross pathology only, sinus contents for culture, sensitivity, and pathology, and the large cyst involving the left medial canthal area for gross pathology. The patient tolerated the procedure well without complications, was extubated in the operating room and transferred to recovery in stable condition. Sponge, instrument, and needle counts correct at the end of the procedure. Total duration of procedure was approximately three hours.  The patient will be admitted in order for overnight recovery.  Once he has recovered well, he will be sent home on September 11, 2000.  He will be sent home on Cefzil 500 mg p.o. b.i.d. for two weeks and Vicodin #30 with two refills, one to two tablets p.o. q.4h. p.r.n. pain.  He is to have a regular diet.  No heavy lifting or no swimming for two weeks after surgery.  Both he and his family were given oral and written instructions.  They are to call with any problems with bleeding, fever, or vomiting, pain, reaction to medications, or any other questions.  He will follow up in the office for splint, pack removal, and bilateral endoscopic debridement of sinus cavities on Thursday, February 21, at 1:50 p.m. DD:  09/10/00 TD:  09/10/00 Job: [id number]

## 2010-12-13 NOTE — Assessment & Plan Note (Signed)
[facility]                            CARDIOLOGY OFFICE NOTE   NAME:Edwin [name], JOHNMATTHEW SOLORIO                     MRN:          [id number]  DATE:12/01/2006                            DOB:          1956/06/03    Edwin [name] was seen in outpatient followup at the [facility]  office on Dec 01, 2006.  Edwin [name] is a 55 year old gentleman who is  seen in mid-April in consultation for chest pain.  He has known vascular  disease and is status post stenting of his right femoral artery by Dr.  Chales Abrahams a few years back.  He has had ongoing chest pain that was  unresponsive to empiric Protonix.  His symptoms occur with exertion and  at rest and there is no clear pattern.  However, he continues to have  daily symptoms of chest pain.   He underwent an adenosine Myoview study on April 25 which demonstrated  mild ischemia in the basal lateral wall with a normal LVEF of 52%.  He  also underwent a carotid ultrasound that showed mild plaque in the right  carotid artery with moderate stenosis in the left internal carotid  artery in the range of 60-79%.   CURRENT MEDICATIONS:  1. Exforge 5/160 mg daily.  2. Diazepam 5 mg daily.  3. Advair 250/50 mcg twice daily.  4. Bystolic 10 mg daily.  5. Clonidine 0.2 mg twice daily.  6. Crestor 10 mg daily.  7. Aspirin 325 mg daily.   ALLERGIES:  CODEINE.   PHYSICAL EXAMINATION:  GENERAL:  The patient is alert and oriented.  He  is in no acute distress.  VITAL SIGNS:  His weight is 202 pounds, blood pressure is 140/100, heart  rate 68, respiratory rate 16.  HEENT:  Normal.  NECK:  Normal carotid upstrokes with a left carotid bruit.  LUNGS:  Significant for diffuse expiratory wheezing.  CARDIAC:  Regular rate and rhythm without murmurs or gallops.  ABDOMEN:  Soft, nontender, no organomegaly.  EXTREMITIES:  There is 2+ edema on the right pretibial region and 1+  edema in the left pretibial region.  Distal pulses are  palpable.   EKG shows normal sinus rhythm and is within normal limits.   ASSESSMENT:  [name] is a 55 year old male with a continued chest  pain syndrome.  While his nuclear study was low-risk it did show a small  area of ischemia in the basal lateral wall.  With his ongoing symptoms I  think he requires a cardiac catheterization to rule out significant  obstructive coronary artery disease.  He clearly has vascular disease as  he has had an occluded superficial femoral artery and has significant  left carotid stenosis.  That places him at high risk of having coronary  disease and with his ongoing tobacco abuse and multiple cardiac risk  factors, I think catheterization is clearly warranted.  I reviewed in  detail today the risks and indications of the procedure and he is  agreeable with proceeding.  He will be scheduled for a heart  catheterization in the outpatient [facility] lab.  He will continue  on his  current medical therapy for now.  Dr. Sherene Sires just made a change to his  antihypertensive regimen yesterday so I will not make any further  changes based on his high blood pressure reading today, as he has not  had time to respond to that change yet.   Regarding his carotid stenosis, will follow up with a carotid ultrasound  again in 6 months.  He should continue risk factor modification as  detailed.  He clearly would benefit from tobacco cessation from both a  cardiac and pulmonary standpoint and this has been reviewed in detail  with him.     [name]. Excell Seltzer, MD  Electronically Signed    MDC/MedQ  DD: 12/01/2006  DT: 12/01/2006  Job #: [id number]   cc:   Charlaine Dalton. Sherene Sires, MD, FCCP

## 2010-12-13 NOTE — Assessment & Plan Note (Signed)
[facility]                             PULMONARY OFFICE NOTE   NAME:Edwin [name], Edwin [name]                     MRN:          [id number]  DATE:10/29/2006                            DOB:          11-16-1955    CHIEF COMPLAINT:  Chest pain.   HISTORY:  A 55 year old white male with a history of heavy alcohol and  cigarette use as well as hypertension and asthmatic bronchitis who  reports for regular followup today stating his breathing is better,  but he is having intermittent chest and upper abdomen discomfort.  This  typically will last from 5 minutes up to 30 minutes, and is not directly  related to activity including physical exertion or eating.  It seems  better, in fact, when he stretches or yawns.  It is not better with  belching, although he feels like he needs to.  The discomfort is like  a being punched in the abdomen.  It does not radiate and is not with  associated nausea, vomiting, or diaphoresis, nor have an pleuritic or  exertional features, as noted above.  No change in bowel or bladder  habits.  He thinks it may have been a little bit better when he took  Zantac.  The patient's last discomfort was over a week ago now, and says  it is not increasing in severity, but seems more frequent in the last  several months than when he first noticed it.   FAMILY HISTORY:  The patient is adopted, but tells me he knows that one  of his grandfathers had heart disease onset in his 65s.   SOCIAL HISTORY:  Significant for history of significant cigarette and  alcohol use.   For full medications review, see face sheet, dated October 29, 2006.  Note  that the patient is already on aspirin.   He comes in after taking his blood pressure medicine this morning with a  blood pressure of 162/98, but looks quite healthy compared to baseline.  HEENT:  Unremarkable.  Oropharynx clear.  LUNG FIELDS:  Reveal trace end expiratory wheeze.  HEART:  Regular rhythm  without murmur, gallop or rub.  Pulse rate of 67.  ABDOMEN:  Soft and benign with no palpable tenderness over the chest  wall or upper abdomen.  No masses or organomegaly.  No bruits  appreciated.  EXTREMITIES:  Warm without calf tenderness, cyanosis, clubbing or edema.   IMPRESSION:  1. Upper epigastric/chest pain of unclear etiology.  The fact that it      seems somewhat better on Zantac suggests to me reflux or gastritis      mechanism, and I recommended a diet plus Protonix 40 mg taken 30      minutes before breakfast daily.  2. Hypertension.  Poorly controlled, and he is at high risk for      ischemic heart disease, having already documented stent, and also      cannot tolerate statins.  I am going to recommend Cardiology      evaluation at this point, and gave him nitroglycerin to try the  next time he has a spell of chest pain, but advised him to sit      down, and that he might get headache and flushing from it.  3. Continued smoking against medical advice with asthmatic component.      Therefore, I have recommended to use selected beta blockers in the      form of bystolic 10 mg daily in the place of Lopressor, and      strongly advised the patient again today to commit to longterm      smoking cessation.  Followup will be in 4 weeks with Cardiology      evaluation in the meantime if possible.  I advised the patient if      he has sustained chest/abdominal discomfort on the above regimen,      he needs to go to the emergency room for further evaluation.     Charlaine Dalton. Sherene Sires, MD, [facility]  Electronically Signed    MBW/MedQ  DD: 10/29/2006  DT: 10/29/2006  Job #: [id number]

## 2010-12-13 NOTE — H&P (Signed)
[facility]  Patient:    Edwin [name], Edwin [name]                     MRN: [id number] Adm. Date:  [id number] Attending:  Gailen Shelter CC:         Danice Goltz, M.D., [facility], [address] [place] [place],                         History and Physical  HISTORY OF PRESENT ILLNESS:  Patient presents as an unattached patient to the emergency room at [facility] via EMS in respiratory distress.  Edwin [name] is a 55 year old gentleman, three-pack-a-day smoker, with a unobtainable medical history at present, who presented via EMS after being short of breath for a total of six days.  Patient presented to the emergency room at [facility] after being treated with nebulizers in transit and noticing to have a respiratory rate of 40 and PAO2 of 40 on arrival to [facility], necessitating intubation.  He was placed on a ventilator and I was summoned to assist with his care.  He is being admitted to the MICU in status asthmaticus and for management of the same.  No further history can be obtained.  There is no family available so we will cover all possibilities, in view of the lack of available history.  PAST MEDICAL HISTORY:  Unknown, though there is note that the patient has had asthma since childhood and he smokes three packs of cigarettes per day.  SOCIAL HISTORY:  Unknown line of work.  Patient is a smoker, as noted above. Alcohol history is unknown.  ALLERGIES:  He has no known medical allergies.  MEDICATIONS:  There are no medications listed.  PHYSICAL EXAMINATION:  GENERAL:  This gentleman is intubated on the ventilator, somewhat agitated at times, with a blood pressure of 250/130, heart rate in the 110 range.  He was febrile.  HEENT:  Remarkable for a large sebaceous cyst versus infected cyst at the bridge of the nose.  The remainder of the HEENT is unremarkable.  NECK:  Supple.  No JVD.  LUNGS:  Very distant.  Occasional end-expiratory wheezes noted  and some rales notes at the bases.  ABDOMEN:  Somewhat distended, possibly due to aerophagia during intubation and bagging.  Bowel sounds were normal.  EXTREMITIES:  Without edema.  LABORATORY DATA:  Arterial blood gas with a pH of 7.25, PACO2 of 50 and a PAO2 in the 220 range after intubation.  CBC:  WBC of 15.9, hemoglobin of 16.8 and hematocrit 50.5, platelet count of 273,000.  Cardiac enzymes:  Marginal.  Chemistries unremarkable, with the exception of a glucose of 200.  IMPRESSION: 1. Status asthmaticus with acute respiratory failure.  Patient needed    ventilatory assistance and needs transfer to the medical intensive care    unit. 2. History of tobacco abuse and dependence. 3. Unknown other medical history which will have to be sorted out during the    patients hospitalization. 4. Sebaceous cyst versus infected abscess on the bridge of the nose.  Will    have ears, nose and throat evaluate.  PLAN:  Plan will be to admit the patient to the intensive care unit for management with ventilatory support, nebulizers, IV steroids and IV antibiotics.  Will also obtain 2-D echo to evaluate left ventricular ejection fraction and function to exclude potential cardiac event precipitating the patients need for ventilatory assistance.  Further plans pending  patients response to the above. DD:  01/12/00 TD:  01/13/00 Job: [id number]

## 2010-12-13 NOTE — Procedures (Signed)
[facility]  Patient:    Edwin [name], Edwin [name] Visit Number: [id number] MRN: [id number]          Service Type: DSU Location: [id number] Attending Physician:  Veneda Melter Dictated by:   Veneda Melter, M.D. [facility] Proc. Date: 01/25/02 Admit Date:  01/25/2002 Discharge Date: 01/26/2002   CC:         Glean Hess R. Revankar, M.D.  Charlaine Dalton. Sherene Sires, M.D. [facility]   Procedure Report  PROCEDURES: 1. Abdominal aortogram. 2. Bilateral lower extremity angiogram. 3. Percutaneous transluminal angioplasty and stent placement, right    superficial femoral artery. 4. Attempted Perclose, left femoral artery.  DIAGNOSES: 1. Peripheral vascular disease. 2. Claudication.  PERFORMING PHYSICIANS:  Veneda Melter, M.D., and Aundra Dubin. Revankar, M.D.  HISTORY:  Edwin [name] is a 55 year old white male who presents with worsening right lower extremity claudication.  The patient underwent ankle brachial indices on September 15, 2001, which were 0.6 on the right and 1.0 on the left, with suggestion of a short-segment occlusion of the mid-right superficial femoral artery.  Due to progression of symptoms, he presents for percutaneous assessment.  DESCRIPTION OF PROCEDURE:  Informed consent was obtained.  Patient brought to the peripheral vascular lab and a 5 French sheath was placed in the left femoral artery using modified Seldinger technique.  A 5 French pigtail catheter was advanced into the descending aorta and an abdominal aortogram performed using power injections of contrast and digital subtraction angiography.  Left lower extremity angiogram was then performed using power injections of contrast through his left femoral sheath.  Using an IMA catheter and a Wholey wire, an end-hole catheter was positioned in the right iliac artery and right lower extremity angiogram performed using power injections of contrast.  Initial findings are as follows:  1. Abdominal aorta is of  normal caliber.  There is mild atheromatous build-up    in the infrarenal segment without critical stenosis or dilatation.  The    renal arteries are single and patent bilaterally.  There is mild narrowing    of 30-40% in the proximal segment of the right renal artery and a moderate    narrowing of 30-40% in the proximal segment of the left renal artery. 2. Right lower extremity:  The right iliac artery is patent.  There is diffuse    atherosclerotic disease and calcification with narrowings of 20-30%.  The    common femoral artery is patent as well.  The superficial femoral artery is    patent in the proximal segment with diffuse disease of 30-40%.  There is    then a short-segment occlusion of approximatley 4 cm at the junction of the    mid- and distal thirds of the vessel.  The distal superficial femoral    artery then reconstitutes via collaterals and is a larger-caliber vessel    with mild irregularities, as is the popliteal.  The trifurcation vessels    are patent and extend to the ankle with brisk flow. 3. Left lower extremity:  The left iliac artery is patent with mild    calcification and atheromatous disease of 20%.  The common femoral artery    has mild disease of 30% as well.  The superficial femoral artery is again    patent with diffuse disease of 30-40% in the proximal section.  The    popliteal artery is patent with mild disease.  The trifurcation vessels are    patent, although the posterior tibial artery tapers in  the proximal calf.    The peroneal and anterior tibial arteries extend to the ankle.  With these findings we elected to proceed with percutaneous intervention in the mid-right superficial femoral artery.  Using a Wholey wire across the iliac bifurcation, a 7 [place] Balkan sheath was placed in the right iliac artery and the patient given heparin systemically.  An angled glidewire was then introduced using a 4 x 8 Powerflex balloon for support.  This was advanced  down to the site of occlusion and used to probe the lesion.  Using the prolapse method, the wire and balloon were carefully advanced through the occlusion and manual injection of the contrast through the balloon lumen confirmed position within the true vessel of the distal superficial femoral artery.  The angled glidewire was then repositioned in the distal vessel and the 4 x 8 Powerflex balloon used to predilate the occlusion at 6 atmospheres for 120 seconds.  Repeat angiography showed recanalization of the vessel with brisk antegrade flow.  There was lucency of the distal occlusive site suggestive of some vessel disruption.  A 5 x 6 Powerflex balloon was introduced and three additional inflations performed within the midaspect of the site of occlusion at up to 6 atmospheres for 240 seconds.  Repeat angiography showed evidence of vessel defect at the distal segment of the occlusion thought secondary to spiral dissection.  We elected to cover this lesion with a stent, and a 7 x 20 SmartStent was introduced, carefully positioned at the junction of the mid- and distal SFA, and deployed.  A 5 x 6 Powerflex balloon was reintroduced and used to postdilate the stent at 4 atmospheres for 60 seconds but was undersized, and a 6 x 2 Powerflex balloon was introduced.  Angiography had shown a lucency in the midsection of the stent thought secondary to thrombus.  A 6 x 2 Powerflex balloon was inflated at 8 atmospheres for 120 seconds with repeat angiography showing resolution of this and no residual vessel defect.  Repeat angiography was then performed of the right lower extremity showing some sluggishness of slow in the peroneal artery, suggesting distal thromboembolic phenomenon.  The anterior and posterior tibial arteries were patent and extended to the ankle.  The patient had 1+ dorsal pedal and posterior tibial pulses.  The _____ was then retracted, as was the [facility] sheath, and an attempt made to  place a Perclose  suture closure device in the left femoral artery.  Hemostasis could not be achieved, and manual pressure was applied until hemostasis was achieved.  He was then transferred to the holding area in stable condition.  FINAL RESULT:  Successful PTA of the mid-right superficial femoral artery with reduction of 100% occlusion to less than 30% with placement of a 5 x 20 SmartStent for coverage of the distal dissection.  ASSESSMENT AND PLAN:  Edwin [name] is a 55 year old gentleman who has undergone recanalization of an occluded right superficial femoral artery. This should hopefully provide him some symptomatic relief.  He has a slightly sluggish flow in the peroneal artery due to distal thromboembolic phenomena, and he will be placed on heparin overnight.  Distal pulses are excellent, and these will be followed serially.  At this point no further intervention is warranted.  Dictated by:   Veneda Melter, M.D. [facility] Attending Physician:  Veneda Melter DD:  01/25/02 TD:  01/27/02 Job: [id number]

## 2010-12-13 NOTE — Consult Note (Signed)
[facility]  Patient:    Edwin [name], Edwin [name]                     MRN: [id number] Proc. Date: 01/15/00 Adm. Date:  [id number] Attending:  Gailen Shelter CC:         Danice Goltz, M.D.                          Consultation Report  REASON FOR CONSULTATION:  I was asked to see this 55 year old white male, a patient of Dr. Ivan Croft who was recently admitted through the [facility] with a significant history of ETOH abuse, cardiomegaly, and active pneumonia.  He was noted and has been intubated since he was admitted. e was noted to have a mass in the left nasal dorsal area, in the area of the medial canthal area.  This mass was red and tender, and somewhat swollen but soft to bimanual palpation.  He has had a CT scan of his head and sinuses which did show a left maxillary sinusitis with a nasogastric tube in place.  The soft tissue mass is not continuous with the intracranial area or within the sinus cavity.  It does appear to be a sebaceous cyst.  The bone underlying this area is normal.  We are asked to consult regarding this mass and sinusitis management.  PHYSICAL EXAMINATION:  GENERAL:  The patient is a 55 year old white male, intubated in the [facility] Intensive Care Unit, in stable condition.  HEENT:  A facial examination does show a compressible soft tissue mass involving the medial canthal area and left nasal dorsum, approximately 1.0 cm x 2.0 cm in  dimension.  We did perform an aspiration of this mass, using a #18 gauge needle on a 10 cc syringe.  No fluid was expressed from the area with this maneuver.  A nasal examination at this point does show a nasogastric tube involving the right nasal chamber.  The oral cavity is negative.  NECK:  Supple, without mass, tenderness, or thyromegaly.  I did review the CT scan of the sinuses obtained on January 13, 2000.  These  are non-_______ axial scans, which do show a soft tissue mass involving the left nasal dorsal area.  It is not contiguous with the intracranial area or within the sinus cavity, and it does appear to be consistent with a sebaceous cyst.  There is a eft maxillary sinus air fluid level with the nasogastric tube in that sinus itself.  The ethmoid cavity showed minimal mucosal thickening.  The right frontal sinus shows minimal mucosal thickening and the sphenoid sinuses appear normal, as does the right maxillary sinus.  ASSESSMENT: 1. Left nasal dorsal sebaceous cyst, rule out signs of any communication    with the intracranial area, or within the sinus cavities. 2. Active left maxillary and very minimal ethmoid sinusitis, with nasogastric    tube in place.  RECOMMENDATIONS: 1. Sebaceous cyst:  Obviously was probably infected, but it seems to be    responding well to the antibiotic therapy.  This can be removed on an    elective outpatient basis, when the patient is stable and discharged    from the hospital. 2. Regarding his acute maxillary sinusitis, continue the Tequin, and using    some Neo-Synephrine on a regular basis will help drain the sinus; and    this sinusitis, though acute, should  respond to appropriate medical therapy.    Obviously if you have some difficulty managing this, or need bacterial    cultures, probably a repeat CT scan of the sinuses would be appropriate,    and then a tap done at the bedside of the maxillary sinus for culture    and sensitivity would be appropriate.  Obviously if he does not respond    as we think he should, I would be happy to re-evaluate Mr. Podolsky, and    proceed with the tap as noted.  Otherwise he is undergoing appropriate    medical therapy, and I would continue this as you are currently doing.  Thank you for allowing me to take part in the are of Mr. Gilham.  If you have any further questions or have any other concerns, please  feel free to contact me at any time. DD:  01/15/00 TD:  01/16/00 Job: [id number]

## 2010-12-13 NOTE — Assessment & Plan Note (Signed)
[facility]                             PULMONARY OFFICE NOTE   NAME:Edwin [name], Edwin [name]                     MRN:          [id number]  DATE:11/30/2006                            DOB:          1955/10/01    PRIMARY SERVICE/EXTENDED FOLLOWUP OFFICE VISIT   HISTORY:  A 55 year old white male active drinker and smoker complaining  of new-onset chest pain in April 2007 which has been evaluated by Dr.  Excell Seltzer with a stress test indicating marked hypertensive response  associated with epigastric pain.  The patient has scheduled to see Dr.  Excell Seltzer tomorrow and states that the only thing that helps the pain is  taking nitroglycerin.  It does not seem to have any pattern.  The pain  does not appear to relate to meals or any specific activity but was  reproducible with exertion and occasionally also occurs at night.  I  have started him on Protonix empirically for the pain but he says that  did not help at all.   He denies any associated nausea, vomiting, diaphoresis, radiation of the  pain, worse pain with coughing, or pleuritic features.  He describes it  as immediately below his subxiphoid process, always located in the same  location, and just as bad in the supine position as the upright  position.   For full inventory of medications please see face sheet column dated Nov 30, 2006.   PHYSICAL EXAMINATION:  GENERAL:  He is a moderately-obese ambulatory  white male in no acute distress with a blood pressure of 156/84.  HEENT:  Unremarkable, oropharynx clear.  LUNG FIELDS:  Reveal inspiratory and expiratory rhonchi.  Air movement  is diminished but adequate.  HEART:  Regular rate and rhythm without murmur, gallop or rub.  ABDOMEN:  Soft, benign, with no palpable tenderness, masses, or  organomegaly.  EXTREMITIES:  Warm without calf tenderness, cyanosis, clubbing, or  edema.   IMPRESSION:  1. Nonspecific lower chest and upper abdominal pain that does  not      respond to Protonix and seems better after nitroglycerin, still      worrisome to me for angina.  I am going to start him on Imdur 30 mg      one daily today and refill his nitroglycerin, since this is the      only medicine he says helps, and asked him to report this to Dr.      Excell Seltzer tomorrow to see if anything further needs to be done from a      cardiac perspective to shed further light on this.  2. His hypertensive response to exercise is noted.  I am going to      recommend stopping Hyzaar and switching him to Exforge 5/160 one      daily.  3. He has active rhonchi on examination so I would avoid higher doses      of beta blockers in this setting.  If beta blockers are used we      should use the most beta-2 selective available and that would be  Bystolic but I would not push it beyond 10 mg per day, preferring      to push clonidine or exforge to higher doses first.  4. Finally, I have asked the patient to again consider a commit to      quit both alcohol and cigarettes, which he has failed to do to      date.  He said he would certainly take it into consideration but      did not make a firm commitment today in this regard.   Followup will be in 1 month to recheck blood pressure on the above  regimen, with followup by Dr. Excell Seltzer to be arranged tomorrow and p.r.n.  thereafter at Dr. Earmon Phoenix discretion.     Charlaine Dalton. Sherene Sires, MD, [facility]  Electronically Signed    MBW/MedQ  DD: 11/30/2006  DT: 11/30/2006  Job #: [id number]

## 2010-12-13 NOTE — Discharge Summary (Signed)
[place]  Patient:    Edwin [name], Edwin [name] Visit Number: [id number] MRN: [id number]          Service Type: DSU Location: [id number] Attending Physician:  Veneda Melter Dictated by:   Joellyn Rued, P.A.-C. Admit Date:  01/25/2002 Discharge Date: 01/26/2002   CC:         One copy sent Dr. Chales Abrahams and Dr. Sandrea Hughs together   Referring Physician Discharge [place]  DATE OF BIRTH:  [birth year]  SUMMARY OF HISTORY:  The patient is a 55 year old white male who was referred to Dr. Chales Abrahams with a history of one year increasing frequency and severity of discomfort in his calf.  He describes this as weakness when he walks.  He has not had any pain at rest or at night.  He does have bilateral lower extremity cramping that occurs at night.  He denies any ulcerations but has recently had some bites and scratches from doing outside yard work.  Due to the increasing frequency and severity that have become limiting, he wishes to consider treatment options.  He denies any chest discomfort, shortness of breath, orthopnea, PND, or edema.  His history is notable for COPD, hypertension, continued tobacco use, and hyperlipidemia.  LABORATORY DATA:  Preadmission H&H 16.5 and 48.3, normal indices, platelets 207, wbcs 9.6. PTT 24, PT 10.5.  Sodium 138, potassium 4.8, BUN 26, creatinine 1.3, glucose 92.  Post procedure H&H was 14.7 and 43.1, normal indices, platelets 172, wbcs 7.7.  Sodium 140, potassium 3.5, BUN 14, creatinine 1.3, glucose 107.  Chest x-ray showed COPD.  EKG showed normal sinus rhythm.  HOSPITAL COURSE:  Dr. Chales Abrahams performed lower extremity angiogram on the patient without difficulty on January 25, 2002.  Based on the progress notes, Dr. Chales Abrahams preformed angioplasty stenting to the right SFA reducing the lesion from 100% to 30%.  Post sheath removal and bedrest he was ambulating without difficulty, catheterization site was intact.  After review, Dr.  Chales Abrahams on July 2 felt that the patient could be discharged home.  DISCHARGE DIAGNOSES: 1. Peripheral vascular disease with claudication status post angioplasty    stenting of the right superficial femoral artery as previously described. 2. History as previously described.  DISPOSITION:  He was discharged home.  MEDICATIONS:  He received a new prescription for Plavix 75 mg q.d. for four weeks.  He was asked to continue: 1. Coated aspirin 325 mg q.d. 2. Lopressor 50 mg one-half tablet b.i.d. 3. Hyzaar 50/12.5 q.d. 4. Advair 250/50 one puff b.i.d. 5. Valium 5 mg q.h.s. 6. Klonodine 0.2 b.i.d. 7. Quinine 260 mg p.r.n. for leg cramps. 8. Dr. Sherene Sires had recently switched his Lipitor to Advicor unknown dosage q.h.s.  ACTIVITY:  He was advised no lifting, driving, sexual activity, or heavy exertion for two days; given permission to return to work after Monday, January 31, 2002.  DIET:  Maintain low salt/fat/cholesterol diet.  WOUND CARE:  If he has any problems with his catheterization site he will call our office immediately.  FOLLOW-UP:  He will have ABIs on February 02, 2002 at 12:30 p.m. at Dr. Donne Hazel office.  He will see Dr. Chales Abrahams on February 28, 2002 at 10:30 a.m.  He was advised no smoking or tobacco products and in approximately six to seven weeks he will need fasting lipids and LFTs.  They may either be drawn by Dr. Chales Abrahams or Dr. Sherene Sires. Dictated by:   Joellyn Rued, P.A.-C. Attending Physician:  Veneda Melter DD:  01/26/02 TD:  01/26/02 Job: [id number]

## 2010-12-13 NOTE — Discharge Summary (Signed)
[facility]  Patient:    JAKHAI, FANT                     MRN: [id number] Adm. Date:  [id number] Disc. Date: [id number] Attending:  Gailen Shelter Dictator:   Earley Favor, RN, MSN, ACNP CC:         Charlaine Dalton. Sherene Sires, M.D. [facility]             [name]. Arletha Grippe, M.D.                           Discharge Summary  DATE OF BIRTH:  February 22, 1956  DISCHARGE DIAGNOSIS: 1.  Acute respiratory failure secondary to community-acquired pneumonia, no     organism specified, with acute respiratory distress syndrome. 2.  Hypertension. 3.  Tobacco and ETOH abuse along with delirium tremens. 4.  Toxic metabolic encephalopathy. 5.  Sinusitis. 6.  Acute renal insufficiency. 7.  Ileus.  PROCEDURE: 1.  January 12, 2000, oral tracheal intubation with extubation on January 24, 2000. 2.  January 13, 2000, 2-D echo demonstrated an ejection fraction of 45% with a     trace of mitral regurgitation.  HISTORY OF PRESENT ILLNESS:  Mr. Irizarry is a 55 year old white male unassigned patient, three pack-per-day smoker with a 12-beer daily drinking habit with childhood asthma, who presented to [facility] via EMS in hypoxia with a pO2 of 40, respiratory failure.  He was urgently intubated with a rapid sequence induction by ED physician and Dr. Jayme Cloud of Pulmonary Critical Care medical division was consulted and assumed his care with subsequent transfer to the Intensive Care Unit.  LAB DATA:  Sodium 145, potassium 3.8, chloride is 114, CO2 is 26, BUN is 24 (peak BUN was 54), creatinine 1.0 (peak creatinine was 2.4), glucose is 89, calcium is 8.0, hemoglobin 10.9, creatinine 31.8, platelets are 164, WBCs 7.0. INR is 1.2, PTT is 25.  Catheter tip demonstrated Staphylococcus ______ negative.  Arterial blood gases on room air, pH 7.26, pCO2 of 60, pO2 of 40. On 100% FIO2, pH was 7.26, pCO2 of 56, pO2 of 28.  Sputum demonstrated normal flora.  Blood cultures  were negative.  Urinalysis was negative.  ALP was 59, ALT was 24, total bilirubin was 12.0, CK is 150, CKMB is 4.5 and troponin I is 0.12, sed rate was 34.  Chest x-ray showed cardiomyopathy with left effusion, bibasilar atelectasis resolution.  CT of the head demonstrated no intracranial process, left maxillary and sphenoid sinus disease was evident.  A left nasal sebaceous cyst without intracranial involvement was noted.  HOSPITAL COURSE: #1 - ACUTE RESPIRATORY FAILURE SECONDARY TO COMMUNITY-ACQUIRED PNEUMONIA, NO ORGANISM SPECIFIED WITH ACUTE RESPIRATORY DISTRESS:  Mr. Gosnell was transferred to ICU at [facility] January 12, 2000, placed on mechanical ventilatory support.  His increased peak inspiratory plateau pressures along with his increased FIO2 demands and radiographic changes were consistent with acute respiratory distress syndrome secondary to community-acquired pneumonia, no organism specified.  He did have sinus disease as demonstrated by CT scan. He required sedation and mechanical ventilatory support along with high FIO2 to provide adequate oxygenation.  He was successfully liberated from mechanical ventilatory support on January 04, 2000, and did not require supplemental oxygen or any further support.  He did require a short period of noninvasive mechanical ventilatory support, i.e. BiPAP.  #2 - HYPERTENSION:  Hypertension was addressed with clonidine-TTS  patch along with Lopressor and Cardura.  His hypertension will be followed on an outpatient basis.  #3 - TOBACCO AND ETOH ABUSE ALONG WITH DELIRIUM TREMENS:  Mr. Keaney was positive for a habit of drinking 12 beers a day along with three packs of cigarettes a day for 25 years.  He suffered delirium tremens following extubation; also toxic metabolic encephalopathy.  These issues were addressed pharmaceutically. His mental status improved, but he still had residual confusion prior to discharge.  #4 - ACUTE RENAL  INSUFFICIENCY:  His BUN and creatinine peaked at 51 and 2.4, resolved with a BUN of 24 and creatinine 1.0 by day of discharge.  #5 - ILEUS:  Ileus resolved with decrease of narcotics.  #6 - SINUSITIS:  Sinusitis was treated with Tequin and will be followed up on an outpatient basis.  DIET:  Regular.  SPECIAL INSTRUCTIONS:  He is to not smoke and not drink.  MEDICATIONS: 1.  Catapres-TTS 2 one q.w.k. 2.  Lopressor 50 mg one-half tab b.i.d. 3.  Hyzaar 50/12.5 one q.d. 4.  Clonazepam 1 mg one-half each a.m. and one each p.m. 5.  Cardura 8 mg one q.h.s.  DISPOSITION/CONDITION ON DISCHARGE:  His acute respiratory failure and acute respiratory distress syndrome have been resolved.  He remains mentally stunted secondary to toxic metabolic encephalopathy.  It is difficult to assess his mental status baseline since we have no baseline to compare it to.  He will be followed up on an outpatient basis as needed. DD:  02/05/00 TD:  02/05/00 Job: [id number]

## 2010-12-13 NOTE — Assessment & Plan Note (Signed)
[facility]                               PULMONARY OFFICE NOTE   NAME:Steptoe, NIKI COSMAN                     MRN:          [id number]  DATE:05/29/2006                            DOB:          November 15, 1955    PRIMARY SERVICE/COMPREHENSIVE MEDICAL EVALUATION   HISTORY:  A 55 year old white male, active smoker, with a history of heavy  alcoholism as well, still drinking some.  However, he is overall taking  much better care of himself, his only symptoms being sometimes wheezing.  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  He denies any orthopnea, PND, variability in terms  of dyspnea, fevers, chills, sweats, chest pain, leg swelling, TIA or  claudication symptoms.   PAST MEDICAL HISTORY:  1. COPD with an asthmatic component felt to be fueled by cigarettes.  2. Hyperlipidemia, unable to tolerate Lipitor and could not afford      Advicor.  3. Alcoholism status post complete cessation previously in July 2001 with      relapse on weekends only.  Since then, chronic rhinitis/sinusitis      status post functional endoscopic sinus surgery February 2002 by Dr.      Arletha Grippe.  4. Erectile dysfunction, responds to the Viagra.  5. Peripheral vascular disease status post right lower extremity arterial      stent dated July 2003; therefore, target LDL less than 100.   ALLERGIES:  1. CODEINE causes GI upset.  2. LIPITOR causes dizziness.   MEDICATIONS:  Reviewed in detail and recorded on the face sheet column dated  May 29, 2006.  He stopped Advair about a week ago.   SOCIAL HISTORY:  He continues to smoke and also drinks occasionally.  He  works as a Designer, multimedia.   FAMILY HISTORY:  He is adopted.  He does not know anything about his  biological relatives.   REVIEW OF SYSTEMS:  Taken in detail on the worksheet and significant for the  problems outlined above.   PHYSICAL EXAMINATION:  This is a  pleasant, healthy-appearing, ambulatory  white male in no acute distress.  He is afebrile with normal vital signs,  with a blood pressure of 130/80, weight is 180 pounds which is no change  from baseline, and blood pressure is 130/86 after his morning medicine this  morning.  HEENT:  Oropharynx is clear, nasal turbinates normal.  Limited funduscopy  revealed no significant retinal or arteriolar change.  Dentition is intact.  Ear canals clear bilaterally.  NECK:  Supple without cervical adenopathy or tenderness.  Carotid upstrokes  were brisk without any bruits.  CHEST:  Revealed pan expiratory wheezing bilaterally with few inspiratory  rhonchi as well.  Nothing localized.  HEART:  There is regular rate and rhythm without murmur, gallop or rub.  CHEST:  Mildly barrel shaped.  There is no displacement of PMI nor increased  P2.  ABDOMEN:  Soft, benign.  No palpable organomegaly, mass or tenderness.  EXTREMITIES:  Warm without calf tenderness, cyanosis, clubbing, edema.  Pedal pulses were  present bilaterally.  NEUROLOGIC:  No focal deficits or pathologic reflexes.  SKIN:  Warm and dry without no lesions.  GENITOURINARY:  Testes descended bilaterally, no nodules.  He was  uncircumcised.  No evidence of inguinal hernia.  RECTAL:  Revealed mild BPH, smooth texture, stool guaiac was negative.   EKG revealed sinus rhythm with no evidence of left atrial abnormality,  ischemic or hypertrophic changes.  CBC was normal.  Total cholesterol was  198 with an LDL of 128, HDL of 35.  Chemistry profile was normal.  TSH was  normal.  Urinalysis was unremarkable.   IMPRESSION:  1. Poorly controlled asthmatic bronchitis in this patient who continues to      smoke against medical advice.  I recommended resuming Advair 250/50      b.i.d. and spent extra time teaching him optimal use.  I emphasized to      him that smoking cessation, not choice of inhalers, is the way to      maintain longterm lung health.   I did not yet get a commitment for him      on this basis.  2. Hypertension, well controlled on present regimen.  3. Peripheral vascular disease.  LDL is not at target but I do not believe      is the main issue in this patient who is actively smoking, especially      since he is having trouble affording his medicines and tolerating      statins.  We might consider low-dose generic statins on his next visit,      depending on whether or not he is really seriously committed to      improving his health (note should be made that he has missed several      appointments and I am not sure, given his alcoholism and cigarette      addiction whether there is anything we can do meaningful other than try      to help control his blood pressure).  4. Health maintenance.  He was updated on tetanus in 2002, Pneumovax in      2002, so we repeated the Pneumovax today and also a flu vaccination.   Followup will be with a set of PFTs in 6 weeks.  We will see him sooner if  needed.    ______________________________  Charlaine Dalton Sherene Sires, MD, [facility]    MBW/MedQ  DD: 06/01/2006  DT: 06/01/2006  Job #: [id number]

## 2010-12-13 NOTE — Letter (Signed)
November 11, 2006    Charlaine Dalton. Sherene Sires, MD, FCCP  [address]  [place] [address]   RE:  CHARLETON, DEYOUNG  MRN:  [id number]  /  DOB:  12-16-1955   Dear Kathlene November:   It was my pleasure to see Edwin [name] as an outpatient at the [facility]  Cardiology Office on November 11, 2006.  As you know, he is a  55 year old man who is here with a chief complaint of chest and  epigastric pain.   Edwin [name] complains of progressive epigastric and chest discomfort  over the past 1 year.  He initially noted symptoms on a monthly basis  that has increased now to daily symptoms that lasts from a few minutes  up to 30 minutes.  There is no exertional component to his symptoms.  He  has been started on empiric Protonix but has not noticed any improvement  since starting that mediation 2 weeks ago.  He does not have any  associated dyspnea, orthopnea, PND, light headedness, syncope,  palpitations or other complaints.  His symptoms do not seem to be  related to food intake.   Edwin [name] has no other acute complaints at this time.   PAST MEDICAL HISTORY:  Is pertinent for the following:  1. COPD with an asthmatic bronchitic component with continued tobacco      use.  2. Peripheral vascular disease with prior stenting of the right      superficial femoral artery.  3. Hypertension.  4. Dyslipidemia.  5. Previous sinus surgery in 2002.   CURRENT MEDICATIONS:  Include:  1. Protonix 40 mg daily.  2. Systolic 10 mg daily.  3. Valium 5 mg at bedtime.  4. Clonidine 0.2 mg twice daily.  5. Advair 250/50 mcg twice daily.  6. Hyzaar 50/12.5 mg daily.  7. Aspirin 325 mg daily.  8. Nitroglycerin as needed.   ALLERGIES:  CODEINE   FAMILY HISTORY:  The patient's grandfather had coronary artery disease  in his late 53s. There is no other history of coronary artery disease in  the family.   SOCIAL HISTORY:  The patient works as a Print production planner for a [employer].  He is divorced and has 2  children.  He lives with his  girlfriend.  He smokes 2 packs of cigarettes daily for the past 35 years  and continues to smoke.  He has a history of marijuana use, but last  used in December 2007.  He drinks 6 to 8 beers on a daily basis.  He  does not drink much caffeine.  He does not exercise.   REVIEW OF SYSTEMS:  A complete 12 point review of systems was performed.  Pertinent positives included ankle swelling, remote history of calf  claudication that has now resolved.  A single episode of congestive  heart failure in 2001.  Asthma as described and sexual dysfunction.  All  other systems are reviewed and are negative except as detailed above.   PHYSICAL EXAMINATION:  The patient is alert and oriented.  He is in no  acute distress.  Weight is 198 pounds.  Blood pressure is 164/100 in the right arm,  176/90 in the left arm.  Heart rate is 58.  Respiratory rate 16.  HEENT:  Normal.  NECK:  Normal carotid upstrokes with a left carotid bruit.  Jugular  venous pressure is normal.  There is no thyromegaly or thyroid nodules.  LUNGS:  There are diffuse rhonchi.  CARDIAC:  The  apex is discrete and nondisplaced.  The heart is regular  rate and rhythm without murmurs or gallops.  There is no right  ventricular heave or lift.  ABDOMEN:  Is soft, nontender, no organomegaly.  No abdominal bruits.  EXTREMITIES:  There is no clubbing, cyanosis.  There is 1+ edema in the  pretibial region.  Femoral pulses are 2+ without bruits.  Distal pulses  in the feet are 2+ and easily palpable bilaterally.  SKIN:  Is warm and dry with some stasis changes in the pretibial  regions.  NEUROLOGIC:  Cranial nerves II through XII were intact.  Strength is 5  out of 5 and equal in the arms and legs bilaterally.  LYMPHATICS:  There is no adenopathy.   EKG shows normal sinus rhythm and is within normal limits.   ASSESSMENT:  Edwin [name] is a 55 year old gentleman with the following  cardiovascular issues:  1.  Chest pain.  His pain is atypical, as it is not related to      exertion.  However, he does have a crescendo pain pattern over the      past year.  I think with his known vascular disease it is prudent      to rule out significant obstructive coronary artery disease.  We      will evaluate him with an adenosine Myoview stress study.  He is      unable to do much walking because of a foot problem and so I think      an exercise stress study will not be possible.  He should continue      a daily aspirin.  He has subinguinal nitroglycerin to use on an as      needed basis.  Dr. Sherene Sires has already reviewed instructions on its      use and what que should prompt him to present for emergency      evaluation.  2. Peripheral arterial disease.  The patient has a left carotid bruit.      We will schedule him for a carotid ultrasound to rule out      significant carotid stenosis.  He also has a history of lower      extremity arterial disease but has no symptoms at this time and has      a good peripheral pulse exam.  3. Dyslipidemia whose most recent lipids drawn in November 2007 showed      a total cholesterol of 198 with triglycerides at 179 and HDL of 35      and an LDL cholesterol of 128.  He was intolerant to Lipitor and      did well on Avicor except he had trouble affording this medication.      He has a co-pay with his insurance company now and I think he would      benefit from statin therapy with a goal LDL of less than 100.  I      will try him on a  low dose of Crestor to see how he tolerates      that.  He was given a prescription for Crestor 10 mg daily.  4. Hypertension.  He has suboptimal control at present.  His      medications were just recently changed and I will review his blood      pressure control when he returns in followup.  He may benefit from      the addition of a calcium channel blocker to his  current regimen.     He certainly will not tolerate high dose beta blockers  with his      lung disease.  5. Risk factor modification. He clearly would benefit from smoking      cessation, which I am sure has been reviewed with him at length in      the past.  6. For followup, I will plan on seeing him back in a few weeks after      his carotid ultrasound and adenosine Myoview study are complete.   Kathlene November, thanks again for allowing me to see Edwin [name]. Please feel free  to call at anytime with questions.    Sincerely,      [name]. Excell Seltzer, MD  Electronically Signed    MDC/MedQ  DD: 11/11/2006  DT: 11/11/2006  Job #: [id number]

## 2012-08-05 ENCOUNTER — Emergency Department (HOSPITAL_COMMUNITY)
Admission: EM | Admit: 2012-08-05 | Discharge: 2012-08-05 | Disposition: A | Discharge status: Home / Self Care | Payer: PRIVATE HEALTH INSURANCE | Attending: Emergency Medicine | Admitting: Emergency Medicine

## 2012-08-05 ENCOUNTER — Encounter (HOSPITAL_COMMUNITY): Payer: Self-pay | Admitting: Emergency Medicine

## 2012-08-05 ENCOUNTER — Emergency Department (HOSPITAL_COMMUNITY): Payer: PRIVATE HEALTH INSURANCE

## 2012-08-05 DIAGNOSIS — I10 Essential (primary) hypertension: Secondary | ICD-10-CM

## 2012-08-05 DIAGNOSIS — I509 Heart failure, unspecified: Secondary | ICD-10-CM | POA: Insufficient documentation

## 2012-08-05 DIAGNOSIS — Z9889 Other specified postprocedural states: Secondary | ICD-10-CM | POA: Insufficient documentation

## 2012-08-05 DIAGNOSIS — R0602 Shortness of breath: Secondary | ICD-10-CM | POA: Insufficient documentation

## 2012-08-05 DIAGNOSIS — E8779 Other fluid overload: Secondary | ICD-10-CM | POA: Insufficient documentation

## 2012-08-05 DIAGNOSIS — F17200 Nicotine dependence, unspecified, uncomplicated: Secondary | ICD-10-CM | POA: Insufficient documentation

## 2012-08-05 DIAGNOSIS — R609 Edema, unspecified: Secondary | ICD-10-CM

## 2012-08-05 HISTORY — DX: Essential (primary) hypertension: I10

## 2012-08-05 LAB — CBC WITH DIFFERENTIAL/PLATELET
Basophils Absolute: 0 10*3/uL (ref 0.0–0.1)
Basophils Relative: 0 % (ref 0–1)
Eosinophils Absolute: 0 10*3/uL (ref 0.0–0.7)
Eosinophils Relative: 0 % (ref 0–5)
HCT: 48.2 % (ref 39.0–52.0)
Lymphocytes Relative: 14 % (ref 12–46)
MCHC: 34.2 g/dL (ref 30.0–36.0)
MCV: 102.1 fL — ABNORMAL HIGH (ref 78.0–100.0)
Monocytes Absolute: 0.7 10*3/uL (ref 0.1–1.0)
Platelets: 195 10*3/uL (ref 150–400)
RDW: 14.5 % (ref 11.5–15.5)
WBC: 7.4 10*3/uL (ref 4.0–10.5)

## 2012-08-05 LAB — COMPREHENSIVE METABOLIC PANEL
ALT: 11 U/L (ref 0–53)
AST: 18 U/L (ref 0–37)
Albumin: 3.3 g/dL — ABNORMAL LOW (ref 3.5–5.2)
CO2: 31 mEq/L (ref 19–32)
Calcium: 9 mg/dL (ref 8.4–10.5)
Creatinine, Ser: 0.78 mg/dL (ref 0.50–1.35)
GFR calc non Af Amer: >90 mL/min (ref >90)
Sodium: 135 mEq/L (ref 135–145)
Total Protein: 6.5 g/dL (ref 6.0–8.3)

## 2012-08-05 LAB — URINE MICROSCOPIC-ADD ON

## 2012-08-05 LAB — URINALYSIS, ROUTINE W REFLEX MICROSCOPIC
Bilirubin Urine: NEGATIVE
Hgb urine dipstick: NEGATIVE
Specific Gravity, Urine: 1.009 (ref 1.005–1.030)
Urobilinogen, UA: 1 mg/dL (ref 0.0–1.0)
pH: 7 (ref 5.0–8.0)

## 2012-08-05 LAB — TROPONIN I: Troponin I: <0.3 ng/mL (ref <0.30)

## 2012-08-05 LAB — PRO B NATRIURETIC PEPTIDE: Pro B Natriuretic peptide (BNP): 13641 pg/mL — ABNORMAL HIGH (ref 0–125)

## 2012-08-05 MED ORDER — FUROSEMIDE 40 MG PO TABS: 40.0000 mg | ORAL_TABLET | Freq: Two times a day (BID) | ORAL | Status: DC

## 2012-08-05 MED ORDER — AMLODIPINE BESYLATE 10 MG PO TABS: 10.0000 mg | ORAL_TABLET | Freq: Every day | ORAL | Status: DC

## 2012-08-05 MED ORDER — LISINOPRIL 10 MG PO TABS: 20.0000 mg | ORAL_TABLET | Freq: Every day | ORAL | Status: DC

## 2012-08-05 MED ORDER — FUROSEMIDE 10 MG/ML IJ SOLN
40.0000 mg | Freq: Once | INTRAMUSCULAR | Status: AC
  Administered 2012-08-05: 40 mg via INTRAVENOUS
  Filled 2012-08-05: qty 4

## 2012-08-05 MED ORDER — METOPROLOL TARTRATE 25 MG PO TABS: 25.0000 mg | ORAL_TABLET | Freq: Two times a day (BID) | ORAL | Status: DC

## 2012-08-05 MED ORDER — ASPIRIN 81 MG PO CHEW
324.0000 mg | CHEWABLE_TABLET | Freq: Once | ORAL | Status: AC
  Administered 2012-08-05: 324 mg via ORAL
  Filled 2012-08-05: qty 4

## 2012-08-05 NOTE — ED Provider Notes (Addendum)
History     CSN: [id number]  Arrival date & time 08/05/12  1340   First MD Initiated Contact with Patient 08/05/12 1415      Chief Complaint  Patient presents with  . Hypertension  . fluid retention     (Consider location/radiation/quality/duration/timing/severity/associated sxs/prior treatment) Patient is a 57 y.o. male presenting with hypertension. The history is provided by the patient.  Hypertension  He has noted gradually progressive and fluid retention in his legs her last 3 weeks. His history of hypertension and congestive heart failure but stopped taking his medications over a year ago. He does not have a physician. He had noted some shortness of breath this morning and this was while laying flat, but no other episodes of dyspnea. He denies headache and tinnitus. He denies chest pain. He denies nausea or vomiting. He did have his blood pressure checked several months ago at work and had been elevated. He is status post coronary stent and also peripheral arterial stent. He is not taking aspirin or Plavix.  Past Medical History  Diagnosis Date  . Hypertension     Past Surgical History  Procedure Date  . Nasal sinus surgery   . Acne cyst removal     No family history on file.  History  Substance Use Topics  . Smoking status: Current Every Day Smoker  . Smokeless tobacco: Not on file  . Alcohol Use: Yes      Review of Systems  All other systems reviewed and are negative.    Allergies  Codeine phosphate  Home Medications  No current outpatient prescriptions on file.  BP 178/109  Pulse 107  Temp 99.1 F (37.3 C) (Oral)  Resp 18  SpO2 100%  Physical Exam  Nursing note and vitals reviewed.  57 year old male, resting comfortably and in no acute distress. Vital signs are significant for tachycardia with heart rate 107, and hypertension with blood pressure 178/109. Oxygen saturation is 100%, which is normal. Head is normocephalic and atraumatic. PERRLA,  EOMI. Oropharynx is clear. Neck is nontender and supple without adenopathy or JVD. Back is nontender and there is no CVA tenderness. Lungs have scattered rhonchi, but no rales or wheezes. Chest is nontender. Heart has regular rate and rhythm without murmur. Abdomen is soft, flat, nontender without masses or hepatosplenomegaly and peristalsis is normoactive. Extremities have 2-3+ edema with erythema the overlying skin and some thickening of the skin and some scaling of the skin. There is also 2+ presacral edema without overlying skin changes. Full range of motion is present of all joints. Skin is warm and dry without rash. Neurologic: Mental status is normal, cranial nerves are intact, there are no motor or sensory deficits.   ED Course  Procedures (including critical care time)  Results for orders placed during the hospital encounter of 08/05/12  CBC WITH DIFFERENTIAL      Component Value Range   WBC 7.4  4.0 - 10.5 K/uL   RBC 4.72  4.22 - 5.81 MIL/uL   Hemoglobin 16.5  13.0 - 17.0 g/dL   HCT 78.2  95.6 - 21.3 %   MCV 102.1 (*) 78.0 - 100.0 fL   MCH 35.0 (*) 26.0 - 34.0 pg   MCHC 34.2  30.0 - 36.0 g/dL   RDW 08.6  57.8 - 46.9 %   Platelets 195  150 - 400 K/uL   Neutrophils Relative 76  43 - 77 %   Neutro Abs 5.6  1.7 - 7.7 K/uL  Lymphocytes Relative 14  12 - 46 %   Lymphs Abs 1.0  0.7 - 4.0 K/uL   Monocytes Relative 10  3 - 12 %   Monocytes Absolute 0.7  0.1 - 1.0 K/uL   Eosinophils Relative 0  0 - 5 %   Eosinophils Absolute 0.0  0.0 - 0.7 K/uL   Basophils Relative 0  0 - 1 %   Basophils Absolute 0.0  0.0 - 0.1 K/uL  COMPREHENSIVE METABOLIC PANEL      Component Value Range   Sodium 135  135 - 145 mEq/L   Potassium 3.9  3.5 - 5.1 mEq/L   Chloride 94 (*) 96 - 112 mEq/L   CO2 31  19 - 32 mEq/L   Glucose, Bld 85  70 - 99 mg/dL   BUN 11  6 - 23 mg/dL   Creatinine, Ser 0.96  0.50 - 1.35 mg/dL   Calcium 9.0  8.4 - 04.5 mg/dL   Total Protein 6.5  6.0 - 8.3 g/dL   Albumin 3.3  (*) 3.5 - 5.2 g/dL   AST 18  0 - 37 U/L   ALT 11  0 - 53 U/L   Alkaline Phosphatase 101  39 - 117 U/L   Total Bilirubin 0.7  0.3 - 1.2 mg/dL   GFR calc non Af Amer >90  >90 mL/min   GFR calc Af Amer >90  >90 mL/min  URINALYSIS, ROUTINE W REFLEX MICROSCOPIC      Component Value Range   Color, Urine YELLOW  YELLOW   APPearance CLEAR  CLEAR   Specific Gravity, Urine 1.009  1.005 - 1.030   pH 7.0  5.0 - 8.0   Glucose, UA NEGATIVE  NEGATIVE mg/dL   Hgb urine dipstick NEGATIVE  NEGATIVE   Bilirubin Urine NEGATIVE  NEGATIVE   Ketones, ur NEGATIVE  NEGATIVE mg/dL   Protein, ur 409 (*) NEGATIVE mg/dL   Urobilinogen, UA 1.0  0.0 - 1.0 mg/dL   Nitrite NEGATIVE  NEGATIVE   Leukocytes, UA NEGATIVE  NEGATIVE  TROPONIN I      Component Value Range   Troponin I <0.30  <0.30 ng/mL  PRO B NATRIURETIC PEPTIDE      Component Value Range   Pro B Natriuretic peptide (BNP) 13641.0 (*) 0 - 125 pg/mL  URINE MICROSCOPIC-ADD ON      Component Value Range   Squamous Epithelial / LPF RARE  RARE   RBC / HPF 0-2  <3 RBC/hpf   Bacteria, UA RARE  RARE   Dg Chest 2 View  08/05/2012  *RADIOLOGY REPORT*  Clinical Data: Hypertension; lower extremity edema  CHEST - 2 VIEW  Comparison: August 24, 2010  Findings: There is no edema or consolidation.  Heart is upper normal in size with normal pulmonary vascularity.  No adenopathy. There is an old healed rib fracture involving the left lateral eighth rib. There is degenerative change in the thoracic spine.  IMPRESSION: No edema or consolidation.   Original Report Authenticated By: Bretta Bang, M.D.     Date: 08/05/2012  Rate: 103  Rhythm: normal sinus rhythm  QRS Axis: normal  Intervals: normal  ST/T Wave abnormalities: nonspecific T wave changes  Conduction Disutrbances:none  Narrative Interpretation: Sinus tachycardia, low voltage, probable old anteroseptal myocardial infarction. Nonspecific T wave flattening in multiple leads. No prior ECG available for  comparison.  Old EKG Reviewed: none available     1. Peripheral edema   2. Hypertension   3. CHF exacerbation  MDM  Hypertension with fluid retention which probably represents right-sided heart failure. Old records are reviewed, and he had been admitted to the hospital of hypertensive urgency and congestive heart failure in January of 2012. He is clearly fluid overloaded, so will be given a dose of furosemide and laboratory workup done to evaluate the possibility of cardiac injury and renal dysfunction.  Workup shows evidence of congestive heart failure but no pulmonary edema and no renal dysfunction. He is discharged with new prescriptions for the medications he had been on which include him out of pain, furosemide, lisinopril, and metoprolol. He is advised to take aspirin daily and is referred back to [facility] cardiology as well as given a referral number to find a PCP.      Dione Booze, MD 08/05/12 1554  Dione Booze, MD 08/05/12 [phone]

## 2012-08-05 NOTE — ED Notes (Signed)
Per patient, has not been on hypertensive meds in over a year, does not have PCP-states he is retaining fluids in thighs-

## 2012-08-05 NOTE — ED Notes (Signed)
Patient transported to X-ray 

## 2012-12-29 ENCOUNTER — Emergency Department (HOSPITAL_COMMUNITY)
Admission: EM | Admit: 2012-12-29 | Discharge: 2012-12-29 | Disposition: A | Discharge status: Home / Self Care | Payer: PRIVATE HEALTH INSURANCE | Attending: Emergency Medicine | Admitting: Emergency Medicine

## 2012-12-29 ENCOUNTER — Encounter (HOSPITAL_COMMUNITY): Payer: Self-pay | Admitting: Emergency Medicine

## 2012-12-29 DIAGNOSIS — Z9114 Patient's other noncompliance with medication regimen: Secondary | ICD-10-CM

## 2012-12-29 DIAGNOSIS — F17200 Nicotine dependence, unspecified, uncomplicated: Secondary | ICD-10-CM | POA: Insufficient documentation

## 2012-12-29 DIAGNOSIS — Z9119 Patient's noncompliance with other medical treatment and regimen: Secondary | ICD-10-CM | POA: Insufficient documentation

## 2012-12-29 DIAGNOSIS — I2510 Atherosclerotic heart disease of native coronary artery without angina pectoris: Secondary | ICD-10-CM | POA: Insufficient documentation

## 2012-12-29 DIAGNOSIS — F1010 Alcohol abuse, uncomplicated: Secondary | ICD-10-CM | POA: Insufficient documentation

## 2012-12-29 DIAGNOSIS — I5042 Chronic combined systolic (congestive) and diastolic (congestive) heart failure: Secondary | ICD-10-CM

## 2012-12-29 DIAGNOSIS — L02419 Cutaneous abscess of limb, unspecified: Secondary | ICD-10-CM | POA: Insufficient documentation

## 2012-12-29 DIAGNOSIS — L03115 Cellulitis of right lower limb: Secondary | ICD-10-CM

## 2012-12-29 DIAGNOSIS — Z9861 Coronary angioplasty status: Secondary | ICD-10-CM | POA: Insufficient documentation

## 2012-12-29 DIAGNOSIS — I10 Essential (primary) hypertension: Secondary | ICD-10-CM | POA: Insufficient documentation

## 2012-12-29 DIAGNOSIS — Z79899 Other long term (current) drug therapy: Secondary | ICD-10-CM | POA: Insufficient documentation

## 2012-12-29 DIAGNOSIS — Z91199 Patient's noncompliance with other medical treatment and regimen due to unspecified reason: Secondary | ICD-10-CM | POA: Insufficient documentation

## 2012-12-29 DIAGNOSIS — M7989 Other specified soft tissue disorders: Secondary | ICD-10-CM | POA: Insufficient documentation

## 2012-12-29 MED ORDER — LISINOPRIL 20 MG PO TABS
20.0000 mg | ORAL_TABLET | Freq: Once | ORAL | Status: AC
  Administered 2012-12-29: 20 mg via ORAL
  Filled 2012-12-29: qty 1

## 2012-12-29 MED ORDER — METOPROLOL TARTRATE 25 MG PO TABS: 25.0000 mg | ORAL_TABLET | Freq: Two times a day (BID) | ORAL | Status: DC

## 2012-12-29 MED ORDER — FUROSEMIDE 40 MG PO TABS
40.0000 mg | ORAL_TABLET | Freq: Once | ORAL | Status: AC
  Administered 2012-12-29: 40 mg via ORAL
  Filled 2012-12-29: qty 1

## 2012-12-29 MED ORDER — CEPHALEXIN 500 MG PO CAPS: 500.0000 mg | ORAL_CAPSULE | Freq: Once | ORAL | Status: DC

## 2012-12-29 MED ORDER — FUROSEMIDE 40 MG PO TABS: 40.0000 mg | ORAL_TABLET | Freq: Two times a day (BID) | ORAL | Status: DC

## 2012-12-29 MED ORDER — LISINOPRIL 10 MG PO TABS: 20.0000 mg | ORAL_TABLET | Freq: Every day | ORAL | Status: DC

## 2012-12-29 MED ORDER — SULFAMETHOXAZOLE-TRIMETHOPRIM 800-160 MG PO TABS: 1.0000 | ORAL_TABLET | Freq: Two times a day (BID) | ORAL | Status: DC

## 2012-12-29 MED ORDER — AMLODIPINE BESYLATE 10 MG PO TABS
10.0000 mg | ORAL_TABLET | Freq: Once | ORAL | Status: AC
  Administered 2012-12-29: 10 mg via ORAL
  Filled 2012-12-29: qty 1

## 2012-12-29 MED ORDER — AMLODIPINE BESYLATE 10 MG PO TABS: 10.0000 mg | ORAL_TABLET | Freq: Every day | ORAL | Status: DC

## 2012-12-29 MED ORDER — SULFAMETHOXAZOLE-TMP DS 800-160 MG PO TABS
1.0000 | ORAL_TABLET | Freq: Once | ORAL | Status: AC
  Administered 2012-12-29: 1 via ORAL
  Filled 2012-12-29: qty 1

## 2012-12-29 MED ORDER — METOPROLOL TARTRATE 25 MG PO TABS
25.0000 mg | ORAL_TABLET | Freq: Once | ORAL | Status: AC
  Administered 2012-12-29: 25 mg via ORAL
  Filled 2012-12-29: qty 1

## 2012-12-29 NOTE — Progress Notes (Signed)
[facility] has seen patient and provided him with a list of primary care resources. °

## 2012-12-29 NOTE — ED Notes (Signed)
Pt states that he can "feel his blood pressure".  States that his legs have been swelling for a "few days".  States he was treated for same in January but ran out of medication.  Also states he has a "sore" on the side of his leg.

## 2012-12-29 NOTE — ED Provider Notes (Signed)
History     CSN: [id number]  Arrival date & time 12/29/12  0907   First MD Initiated Contact with Patient 12/29/12 [phone]      Chief Complaint  Patient presents with  . Leg Swelling  . Hypertension    HPI Patient reports he feels like his blood pressure is high today.  The patient is on Norvasc, Lasix, lisinopril, Lopressor at home.  He's been out of that since mid April 2014.  He has insurance and does not have her primary care physician.  He states he needs to make an appointment but hasn't.  He does have a history of congestive heart failure and known coronary artery disease with a stent inserted 2008.  He does drink alcohol every day and drinks approximately 3-4 beers.  He states this helps him sleep.  He continues to work.  He continues to smoke cigarettes.  He also presents complaining of mild bilateral lower extremity swelling with chronic ulcerations of his right lower extremity that have been present for months.  The patient has not had these addressed.  He denies fevers and chills.  He reports some mild increasing pain around these ulcerations.  He denies pain in that leg in general and denies claudication-like symptoms.  His symptoms are mild in severity.  No chest pain or shortness of breath.  Abdominal pain.  No nausea vomiting or diarrhea.    Past Medical History  Diagnosis Date  . Hypertension     Past Surgical History  Procedure Laterality Date  . Nasal sinus surgery    . Acne cyst removal      No family history on file.  History  Substance Use Topics  . Smoking status: Current Every Day Smoker  . Smokeless tobacco: Not on file  . Alcohol Use: Yes      Review of Systems  All other systems reviewed and are negative.    Allergies  Codeine phosphate  Home Medications   Current Outpatient Rx  Name  Route  Sig  Dispense  Refill  . amLODipine (NORVASC) 10 MG tablet   Oral   Take 1 tablet (10 mg total) by mouth daily.   30 tablet   2   . furosemide  (LASIX) 40 MG tablet   Oral   Take 1 tablet (40 mg total) by mouth 2 (two) times daily.   60 tablet   2   . lisinopril (PRINIVIL,ZESTRIL) 10 MG tablet   Oral   Take 2 tablets (20 mg total) by mouth daily.   30 tablet   2   . metoprolol (LOPRESSOR) 25 MG tablet   Oral   Take 1 tablet (25 mg total) by mouth 2 (two) times daily.   60 tablet   2     BP 186/118  Pulse 100  Temp(Src) 98 F (36.7 C) (Oral)  Resp 18  SpO2 100%  Physical Exam  Nursing note and vitals reviewed. Constitutional: He is oriented to person, place, and time. He appears well-developed and well-nourished.  HENT:  Head: Normocephalic and atraumatic.  Eyes: EOM are normal.  Neck: Normal range of motion.  Cardiovascular: Normal rate, regular rhythm, normal heart sounds and intact distal pulses.   Pulmonary/Chest: Effort normal and breath sounds normal. No respiratory distress.  Abdominal: Soft. He exhibits no distension. There is no tenderness.  Genitourinary: Rectum normal.  Musculoskeletal: Normal range of motion.  Patient with mild right lower extremity erythema with 2 chronic appearing ulcerations one of his medial lower leg  one of his lateral lower leg.  Mild warmth to this area.  Normal pulses his right foot.  Changes of chronic venous insufficiency present bilaterally the  Neurological: He is alert and oriented to person, place, and time.  Skin: Skin is warm and dry.  Psychiatric: He has a normal mood and affect. Judgment normal.    ED Course  Procedures (including critical care time)  Labs Reviewed - No data to display No results found.   1. Hypertension   2. Cellulitis of right lower extremity   3. CHF (congestive heart failure), chronic, combined   4. H/O medication noncompliance       MDM  Patient presents with hypertension.  He denies chest pain shortness of breath.  Nothing to suggest hypertensive emergency at this time.  The patient be restarted on his home medications which  she's been out of for the last 6-8 weeks.  He has insurance but he does not have a primary care physician.  I stressed importance of primary care.  I stressed the importance of stopping smoking cigarettes.  I was able to schedule the patient a primary care appointment tomorrow with [facility] on E[facility].  His appointment is tomorrow morning at 9:30 AM with Mrs, Nicki Reaper, NP.  Few (falls of his antihypertensive medications.  I will place the patient on Bactrim for what appears to be an early developing cellulitis of his right lower extremity.  You also need to followup with his cardiologist Dr. Excell Seltzer for which I stressed importance of needing to make an appointment.         Lyanne Co, MD 12/29/12 [phone]

## 2012-12-29 NOTE — ED Notes (Signed)
States he has been out of meds since the beginning of April-B/L LE edema-right leg with dime sized sore X 2

## 2012-12-30 ENCOUNTER — Encounter: Payer: Self-pay | Admitting: Internal Medicine

## 2012-12-30 ENCOUNTER — Other Ambulatory Visit (INDEPENDENT_AMBULATORY_CARE_PROVIDER_SITE_OTHER): Payer: PRIVATE HEALTH INSURANCE

## 2012-12-30 ENCOUNTER — Ambulatory Visit (INDEPENDENT_AMBULATORY_CARE_PROVIDER_SITE_OTHER): Payer: PRIVATE HEALTH INSURANCE | Admitting: Internal Medicine

## 2012-12-30 VITALS — BP 172/102 | HR 87 | Temp 98.1°F | Ht 68.0 in | Wt 161.8 lb

## 2012-12-30 DIAGNOSIS — I10 Essential (primary) hypertension: Secondary | ICD-10-CM

## 2012-12-30 DIAGNOSIS — Z0000 Encounter for general adult medical examination without abnormal findings: Secondary | ICD-10-CM

## 2012-12-30 DIAGNOSIS — Z125 Encounter for screening for malignant neoplasm of prostate: Secondary | ICD-10-CM

## 2012-12-30 DIAGNOSIS — J4489 Other specified chronic obstructive pulmonary disease: Secondary | ICD-10-CM

## 2012-12-30 DIAGNOSIS — Z13220 Encounter for screening for lipoid disorders: Secondary | ICD-10-CM

## 2012-12-30 DIAGNOSIS — J449 Chronic obstructive pulmonary disease, unspecified: Secondary | ICD-10-CM

## 2012-12-30 DIAGNOSIS — L98499 Non-pressure chronic ulcer of skin of other sites with unspecified severity: Secondary | ICD-10-CM

## 2012-12-30 DIAGNOSIS — Z130 Encounter for screening for diseases of the blood and blood-forming organs and certain disorders involving the immune mechanism: Secondary | ICD-10-CM

## 2012-12-30 DIAGNOSIS — Z23 Encounter for immunization: Secondary | ICD-10-CM

## 2012-12-30 DIAGNOSIS — I70209 Unspecified atherosclerosis of native arteries of extremities, unspecified extremity: Secondary | ICD-10-CM

## 2012-12-30 DIAGNOSIS — E785 Hyperlipidemia, unspecified: Secondary | ICD-10-CM

## 2012-12-30 LAB — LIPID PANEL
Cholesterol: 181 mg/dL (ref 0–200)
HDL: 73 mg/dL (ref >39.00)
VLDL: 11.8 mg/dL (ref 0.0–40.0)

## 2012-12-30 LAB — BASIC METABOLIC PANEL
BUN: 12 mg/dL (ref 6–23)
Calcium: 9.1 mg/dL (ref 8.4–10.5)
GFR: 93.95 mL/min (ref >60.00)
Potassium: 5.1 mEq/L (ref 3.5–5.1)
Sodium: 136 mEq/L (ref 135–145)

## 2012-12-30 LAB — CBC
HCT: 49.6 % (ref 39.0–52.0)
Hemoglobin: 16.4 g/dL (ref 13.0–17.0)
MCHC: 33.1 g/dL (ref 30.0–36.0)
RDW: 14.7 % — ABNORMAL HIGH (ref 11.5–14.6)
WBC: 6.2 10*3/uL (ref 4.5–10.5)

## 2012-12-30 LAB — PSA: PSA: 0.39 ng/mL (ref 0.10–4.00)

## 2012-12-30 LAB — HEPATIC FUNCTION PANEL
ALT: 14 U/L (ref 0–53)
AST: 22 U/L (ref 0–37)
Alkaline Phosphatase: 63 U/L (ref 39–117)
Total Bilirubin: 1 mg/dL (ref 0.3–1.2)

## 2012-12-30 LAB — HEMOGLOBIN A1C: Hgb A1c MFr Bld: 5.1 % (ref 4.6–6.5)

## 2012-12-30 NOTE — Assessment & Plan Note (Signed)
Continue septra at this time Please let me know if not improving Should follow up with cardiology for management

## 2012-12-30 NOTE — Addendum Note (Signed)
Addended by: Brenton Grills C on: 12/30/2012 10:55 AM   Modules accepted: Orders

## 2012-12-30 NOTE — Patient Instructions (Signed)
DASH Diet The DASH diet stands for "Dietary Approaches to Stop Hypertension." It is a healthy eating plan that has been shown to reduce high blood pressure (hypertension) in as little as 14 days, while also possibly providing other significant health benefits. These other health benefits include reducing the risk of breast cancer after menopause and reducing the risk of type 2 diabetes, heart disease, colon cancer, and stroke. Health benefits also include weight loss and slowing kidney failure in patients with chronic kidney disease.  DIET GUIDELINES  Limit salt (sodium). Your diet should contain less than 1500 mg of sodium daily.  Limit refined or processed carbohydrates. Your diet should include mostly whole grains. Desserts and added sugars should be used sparingly.  Include small amounts of heart-healthy fats. These types of fats include nuts, oils, and tub margarine. Limit saturated and trans fats. These fats have been shown to be harmful in the body. CHOOSING FOODS  The following food groups are based on a 2000 calorie diet. See your Registered Dietitian for individual calorie needs. Grains and Grain Products (6 to 8 servings daily)  Eat More Often: Whole-wheat bread, brown rice, whole-grain or wheat pasta, quinoa, popcorn without added fat or salt (air popped).  Eat Less Often: White bread, white pasta, white rice, cornbread. Vegetables (4 to 5 servings daily)  Eat More Often: Fresh, frozen, and canned vegetables. Vegetables may be raw, steamed, roasted, or grilled with a minimal amount of fat.  Eat Less Often/Avoid: Creamed or fried vegetables. Vegetables in a cheese sauce. Fruit (4 to 5 servings daily)  Eat More Often: All fresh, canned (in natural juice), or frozen fruits. Dried fruits without added sugar. One hundred percent fruit juice ( cup [237 mL] daily).  Eat Less Often: Dried fruits with added sugar. Canned fruit in light or heavy syrup. [employer], Fish, and Poultry (2  servings or less daily. One serving is 3 to 4 oz [85-114 g]).  Eat More Often: Ninety percent or leaner ground beef, tenderloin, sirloin. Round cuts of beef, chicken breast, [place] breast. All fish. Grill, bake, or broil your meat. Nothing should be fried.  Eat Less Often/Avoid: Fatty cuts of meat, [place], or chicken leg, thigh, or wing. Fried cuts of meat or fish. Dairy (2 to 3 servings)  Eat More Often: Low-fat or fat-free milk, low-fat plain or light yogurt, reduced-fat or part-skim cheese.  Eat Less Often/Avoid: Milk (whole, 2%).Whole milk yogurt. Full-fat cheeses. Nuts, Seeds, and Legumes (4 to 5 servings per week)  Eat More Often: All without added salt.  Eat Less Often/Avoid: Salted nuts and seeds, canned beans with added salt. Fats and Sweets (limited)  Eat More Often: Vegetable oils, tub margarines without trans fats, sugar-free gelatin. Mayonnaise and salad dressings.  Eat Less Often/Avoid: Coconut oils, palm oils, butter, stick margarine, cream, half and half, cookies, candy, pie. FOR MORE INFORMATION The Dash Diet Eating Plan: www.dashdiet.org Document Released: 07/03/2011 Document Revised: 10/06/2011 Document Reviewed: 07/03/2011 [facility] Patient Information 2014 [place], [place]. Managing Your High Blood Pressure Blood pressure is a measurement of how forceful your blood is pressing against the walls of the arteries. Arteries are muscular tubes within the circulatory system. Blood pressure does not stay the same. Blood pressure rises when you are active, excited, or nervous; and it lowers during sleep and relaxation. If the numbers measuring your blood pressure stay above normal most of the time, you are at risk for health problems. High blood pressure (hypertension) is a long-term (chronic) condition in which blood  pressure is elevated. A blood pressure reading is recorded as two numbers, such as 120 over 80 (or 120/80). The first, higher number is called the systolic  pressure. It is a measure of the pressure in your arteries as the heart beats. The second, lower number is called the diastolic pressure. It is a measure of the pressure in your arteries as the heart relaxes between beats.  Keeping your blood pressure in a normal range is important to your overall health and prevention of health problems, such as heart disease and stroke. When your blood pressure is uncontrolled, your heart has to work harder than normal. High blood pressure is a very common condition in adults because blood pressure tends to rise with age. Men and women are equally likely to have hypertension but at different times in life. Before age 13, men are more likely to have hypertension. After 56 years of age, women are more likely to have it. Hypertension is especially common in African Americans. This condition often has no signs or symptoms. The cause of the condition is usually not known. Your caregiver can help you come up with a plan to keep your blood pressure in a normal, healthy range. BLOOD PRESSURE STAGES Blood pressure is classified into four stages: normal, prehypertension, stage 1, and stage 2. Your blood pressure reading will be used to determine what type of treatment, if any, is necessary. Appropriate treatment options are tied to these four stages:  Normal  Systolic pressure (mm Hg): below 120.  Diastolic pressure (mm Hg): below 80. Prehypertension  Systolic pressure (mm Hg): 120 to 139.  Diastolic pressure (mm Hg): 80 to 89. Stage1  Systolic pressure (mm Hg): 140 to 159.  Diastolic pressure (mm Hg): 90 to 99. Stage2  Systolic pressure (mm Hg): 160 or above.  Diastolic pressure (mm Hg): 100 or above. RISKS RELATED TO HIGH BLOOD PRESSURE Managing your blood pressure is an important responsibility. Uncontrolled high blood pressure can lead to:  A heart attack.  A stroke.  A weakened blood vessel (aneurysm).  Heart failure.  Kidney damage.  Eye  damage.  Metabolic syndrome.  Memory and concentration problems. HOW TO MANAGE YOUR BLOOD PRESSURE Blood pressure can be managed effectively with lifestyle changes and medicines (if needed). Your caregiver will help you come up with a plan to bring your blood pressure within a normal range. Your plan should include the following: Education  Read all information provided by your caregivers about how to control blood pressure.  Educate yourself on the latest guidelines and treatment recommendations. New research is always being done to further define the risks and treatments for high blood pressure. Lifestylechanges  Control your weight.  Avoid smoking.  Stay physically active.  Reduce the amount of salt in your diet.  Reduce stress.  Control any chronic conditions, such as high cholesterol or diabetes.  Reduce your alcohol intake. Medicines  Several medicines (antihypertensive medicines) are available, if needed, to bring blood pressure within a normal range. Communication  Review all the medicines you take with your caregiver because there may be side effects or interactions.  Talk with your caregiver about your diet, exercise habits, and other lifestyle factors that may be contributing to high blood pressure.  See your caregiver regularly. Your caregiver can help you create and adjust your plan for managing high blood pressure. RECOMMENDATIONS FOR TREATMENT AND FOLLOW-UP  The following recommendations are based on current guidelines for managing high blood pressure in nonpregnant adults. Use these recommendations to  identify the proper follow-up period or treatment option based on your blood pressure reading. You can discuss these options with your caregiver.  Systolic pressure of 120 to 139 or diastolic pressure of 80 to 89: Follow up with your caregiver as directed.  Systolic pressure of 140 to 160 or diastolic pressure of 90 to 100: Follow up with your caregiver within  2 months.  Systolic pressure above 160 or diastolic pressure above 100: Follow up with your caregiver within 1 month.  Systolic pressure above 180 or diastolic pressure above 110: Consider antihypertensive therapy; follow up with your caregiver within 1 week.  Systolic pressure above 200 or diastolic pressure above 120: Begin antihypertensive therapy; follow up with your caregiver within 1 week. Document Released: 04/07/2012 Document Reviewed: 04/07/2012 [facility] Patient Information 2014 [place], [place].

## 2012-12-30 NOTE — Progress Notes (Signed)
HPI  Pt presents to the clinic today to establish care. He does not have a PCP. He did go to the ER yesterday for elevated blood pressure. They started him on blood pressure medication but he has not picked it up yet. He has had high blood pressure for the past 10 years. He has been non compliant with his treatment regimen. He has not seen cardiology in 3.5 years. He was also being followed by Dr. Sherene Sires for his COPD. He is smoking 1.5 packs per day for the last 40 years. He has no interest in quitting. He has not seen Dr. Sherene Sires in about 3 years. He does have PVD of his bilateral lower extremities. This has caused a ulcer on his left shin. He is currently being treated with septra for cellulitis secondary to the ulcer.  Flu: 2012 Tetanus: 2002 Colonoscopy: never Eye doctor: as needed Dentist: as needed  Past Medical History  Diagnosis Date  . Hypertension   . COPD 11/27/2007    Qualifier: Diagnosis of  By: Cheri Guppy    . ERECTILE DYSFUNCTION 11/27/2007    Qualifier: Diagnosis of  By: Cheri Guppy    . HYPERLIPIDEMIA 11/27/2007    Qualifier: Diagnosis of  By: Cheri Guppy    . HYPERTENSION, BENIGN 05/23/2009    Qualifier: Diagnosis of  By: Sherene Sires MD, Charlaine Dalton   . Asthma     Current Outpatient Prescriptions  Medication Sig Dispense Refill  . amLODipine (NORVASC) 10 MG tablet Take 1 tablet (10 mg total) by mouth daily.  30 tablet  2  . furosemide (LASIX) 40 MG tablet Take 1 tablet (40 mg total) by mouth 2 (two) times daily.  60 tablet  2  . lisinopril (PRINIVIL,ZESTRIL) 10 MG tablet Take 2 tablets (20 mg total) by mouth daily.  30 tablet  2  . metoprolol tartrate (LOPRESSOR) 25 MG tablet Take 1 tablet (25 mg total) by mouth 2 (two) times daily.  60 tablet  2  . sulfamethoxazole-trimethoprim (SEPTRA DS) 800-160 MG per tablet Take 1 tablet by mouth 2 (two) times daily.  14 tablet  0   No current facility-administered medications for this visit.    Allergies  Allergen Reactions  .  Codeine Phosphate     REACTION: unspecified    Family History  Problem Relation Age of Onset  . Adopted: Yes  . Heart disease Maternal Grandfather     History   Social History  . Marital Status: Single    Spouse Name: N/A    Number of Children: N/A  . Years of Education: 12   Occupational History  . Cutter Operator    Social History Main Topics  . Smoking status: Current Every Day Smoker -- 1.50 packs/day for 40 years  . Smokeless tobacco: Never Used  . Alcohol Use: 2.4 oz/week    4 Cans of beer per week  . Drug Use: No  . Sexually Active: Not Currently   Other Topics Concern  . Not on file   Social History Narrative   Regular exercise-no   Caffeine Use-yes    ROS:  Constitutional: Denies fever, malaise, fatigue, headache or abrupt weight changes.  HEENT: Denies eye pain, eye redness, ear pain, ringing in the ears, wax buildup, runny nose, nasal congestion, bloody nose, or sore throat. Respiratory: Pt reports cough. Denies difficulty breathing, shortness of breath, or sputum production.   Cardiovascular: Denies chest pain, chest tightness, palpitations or swelling in the hands or feet.  Gastrointestinal: Denies abdominal pain,  bloating, constipation, diarrhea or blood in the stool.  GU: Denies frequency, urgency, pain with urination, blood in urine, odor or discharge. Musculoskeletal: Denies decrease in range of motion, difficulty with gait, muscle pain or joint pain and swelling.  Skin: Pt reports redness of BLE with ulcer on left shin. Denies rashes, lesions.  Neurological: Denies dizziness, difficulty with memory, difficulty with speech or problems with balance and coordination.   No other specific complaints in a complete review of systems (except as listed in HPI above).  PE:  BP 172/102  Pulse 87  Temp(Src) 98.1 F (36.7 C) (Oral)  Ht 5\' 8"  (1.727 m)  Wt 161 lb 12 oz (73.369 kg)  BMI 24.6 kg/m2  SpO2 94% Wt Readings from Last 3 Encounters:  12/30/12  161 lb 12 oz (73.369 kg)  05/23/09 199 lb (90.266 kg)  01/01/07 187 lb (84.823 kg)    General: Appears his stated age, well developed, well nourished in NAD. Skin: PVD of BLE, ruddy appearance, nickel sized ulcer of left skin surrounded by cellulitis.  HEENT: Head: normal shape and size; Eyes: sclera injected, no icterus, conjunctiva pink, PERRLA and EOMs intact; Ears: Tm's gray and intact, normal light reflex; Nose: mucosa pink and moist, septum midline; Throat/Mouth: Teeth missing, mucosa pink and moist, no lesions or ulcerations noted.  Neck: Normal range of motion. Neck supple, trachea midline. No massses, lumps or thyromegaly present.  Cardiovascular: Normal rate and rhythm. S1,S2 noted.  No murmur, rubs or gallops noted. No JVD or BLE edema. No carotid bruits noted. Pulmonary/Chest: Normal effort and bilateral expiratory wheezes. No respiratory distress. No rales or ronchi noted.  Abdomen: Soft and nontender. Normal bowel sounds, no bruits noted. No distention or masses noted. Liver, spleen and kidneys non palpable. Musculoskeletal: Normal range of motion. No signs of joint swelling. No difficulty with gait.  Neurological: Alert and oriented. Cranial nerves II-XII intact. Coordination normal. +DTRs bilaterally. Psychiatric: Mood and affect normal. Behavior is normal. Judgment and thought content normal.      Assessment and Plan:  Preventative Health maintenance:  Encouraged pt to quit smoking, cessation material given, pt declines at this time. Will obtain labs today Tdap given today Encouraged pt to visit eye doctor and dentist at least yearly Pt not ready to set up colonoscopy

## 2012-12-30 NOTE — Assessment & Plan Note (Signed)
Encouraged pt to make an appointment with Dr. Sherene Sires for management of COPD Advised pt to quit smoking- pt declines

## 2012-12-30 NOTE — Assessment & Plan Note (Signed)
Will check lipid profile today.  

## 2012-12-30 NOTE — Assessment & Plan Note (Signed)
Uncontrolled secondary to non compliance Advised pt to pick up his medication and take as directed I want to see him back in 2 weeks to recheck Advised him on the importance of a DASH diet

## 2013-01-06 ENCOUNTER — Telehealth: Payer: Self-pay

## 2013-01-06 MED ORDER — LISINOPRIL 10 MG PO TABS: 20.0000 mg | ORAL_TABLET | Freq: Every day | ORAL | Status: DC

## 2013-01-06 NOTE — Telephone Encounter (Signed)
Patient notified of results and also requested rx for #60 lisinopril, only given #30 by hospital

## 2013-01-31 ENCOUNTER — Ambulatory Visit (INDEPENDENT_AMBULATORY_CARE_PROVIDER_SITE_OTHER): Payer: PRIVATE HEALTH INSURANCE | Admitting: Internal Medicine

## 2013-01-31 ENCOUNTER — Encounter: Payer: Self-pay | Admitting: Internal Medicine

## 2013-01-31 VITALS — BP 136/72 | HR 74 | Resp 16 | Wt 155.1 lb

## 2013-01-31 DIAGNOSIS — Z566 Other physical and mental strain related to work: Secondary | ICD-10-CM

## 2013-01-31 DIAGNOSIS — F411 Generalized anxiety disorder: Secondary | ICD-10-CM | POA: Insufficient documentation

## 2013-01-31 DIAGNOSIS — I10 Essential (primary) hypertension: Secondary | ICD-10-CM

## 2013-01-31 DIAGNOSIS — G4700 Insomnia, unspecified: Secondary | ICD-10-CM | POA: Insufficient documentation

## 2013-01-31 DIAGNOSIS — L98499 Non-pressure chronic ulcer of skin of other sites with unspecified severity: Secondary | ICD-10-CM

## 2013-01-31 DIAGNOSIS — Z569 Unspecified problems related to employment: Secondary | ICD-10-CM

## 2013-01-31 MED ORDER — MIRTAZAPINE 15 MG PO TBDP
15.0000 mg | ORAL_TABLET | Freq: Every day | ORAL | Status: DC
Start: 2013-01-31 — End: 2013-02-28

## 2013-01-31 MED ORDER — ALPRAZOLAM 0.5 MG PO TABS: 0.5000 mg | ORAL_TABLET | Freq: Every evening | ORAL | Status: DC | PRN

## 2013-01-31 NOTE — Patient Instructions (Signed)
Stress Management Stress is a state of physical or mental tension that often results from changes in your life or normal routine. Some common causes of stress are:  Death of a loved one.  Injuries or severe illnesses.  Getting fired or changing jobs.  Moving into a new home. Other causes may be:  Sexual problems.  Business or financial losses.  Taking on a large debt.  Regular conflict with someone at home or at work.  Constant tiredness from lack of sleep. It is not just bad things that are stressful. It may be stressful to:  Win the lottery.  Get married.  Buy a new car. The amount of stress that can be easily tolerated varies from person to person. Changes generally cause stress, regardless of the types of change. Too much stress can affect your health. It may lead to physical or emotional problems. Too little stress (boredom) may also become stressful. SUGGESTIONS TO REDUCE STRESS:  Talk things over with your family and friends. It often is helpful to share your concerns and worries. If you feel your problem is serious, you may want to get help from a professional counselor.  Consider your problems one at a time instead of lumping them all together. Trying to take care of everything at once may seem impossible. List all the things you need to do and then start with the most important one. Set a goal to accomplish 2 or 3 things each day. If you expect to do too many in a single day you will naturally fail, causing you to feel even more stressed.  Do not use alcohol or drugs to relieve stress. Although you may feel better for a short time, they do not remove the problems that caused the stress. They can also be habit forming.  Exercise regularly - at least 3 times per week. Physical exercise can help to relieve that "uptight" feeling and will relax you.  The shortest distance between despair and hope is often a good night's sleep.  Go to bed and get up on time allowing  yourself time for appointments without being rushed.  Take a short "time-out" period from any stressful situation that occurs during the day. Close your eyes and take some deep breaths. Starting with the muscles in your face, tense them, hold it for a few seconds, then relax. Repeat this with the muscles in your neck, shoulders, hand, stomach, back and legs.  Take good care of yourself. Eat a balanced diet and get plenty of rest.  Schedule time for having fun. Take a break from your daily routine to relax. HOME CARE INSTRUCTIONS   Call if you feel overwhelmed by your problems and feel you can no longer manage them on your own.  Return immediately if you feel like hurting yourself or someone else. Document Released: 01/07/2001 Document Revised: 10/06/2011 Document Reviewed: 08/30/2007 [employer] Patient Information 2014 [employer].  

## 2013-01-31 NOTE — Assessment & Plan Note (Signed)
Much better control Continue current meds

## 2013-01-31 NOTE — Assessment & Plan Note (Signed)
Will start Remeron with Xanax in the beginning as needed Consider CBT Consider changing employment

## 2013-01-31 NOTE — Addendum Note (Signed)
Addended by: Lorre Munroe on: 01/31/2013 10:05 AM   Modules accepted: Orders

## 2013-01-31 NOTE — Assessment & Plan Note (Signed)
Will start Remeron with Xanax in the beginning as needed Consider CBT Consider changing employment 

## 2013-01-31 NOTE — Assessment & Plan Note (Signed)
Will refer back to cardiology for further evaluation and management

## 2013-01-31 NOTE — Progress Notes (Signed)
Subjective:    Patient ID: Edwin [name], male    DOB: 1956-01-21, 57 y.o.   MRN: [id number]  HPI  Pt presents to the clinic today for 1 month f/u of HTN. At his last visit, his BP was 172/102. He had been noncompliant with his medication regimen. He was supposed to be on Lisinopril, Lasix, norvasc and Lopressor. He only took his medications when he remembered. He has been doing much better with compliance and diet. He is still smoking. He also c/o anxiety and stress at work. This is starting to cause him to have some trouble sleeping. He has taken Valium in the past for this. He has never really taken anything for his insomnia. His job is his major stressor. He does not feel he can change employment at this time.  Review of Systems  Past Medical History  Diagnosis Date  . Hypertension   . COPD 11/27/2007    Qualifier: Diagnosis of  By: Cheri Guppy    . ERECTILE DYSFUNCTION 11/27/2007    Qualifier: Diagnosis of  By: Cheri Guppy    . HYPERLIPIDEMIA 11/27/2007    Qualifier: Diagnosis of  By: Cheri Guppy    . HYPERTENSION, BENIGN 05/23/2009    Qualifier: Diagnosis of  By: Sherene Sires MD, Charlaine Dalton   . Asthma     Current Outpatient Prescriptions  Medication Sig Dispense Refill  . amLODipine (NORVASC) 10 MG tablet Take 1 tablet (10 mg total) by mouth daily.  30 tablet  2  . furosemide (LASIX) 40 MG tablet Take 1 tablet (40 mg total) by mouth 2 (two) times daily.  60 tablet  2  . lisinopril (PRINIVIL,ZESTRIL) 10 MG tablet Take 2 tablets (20 mg total) by mouth daily.  60 tablet  2  . metoprolol tartrate (LOPRESSOR) 25 MG tablet Take 1 tablet (25 mg total) by mouth 2 (two) times daily.  60 tablet  2  . sulfamethoxazole-trimethoprim (SEPTRA DS) 800-160 MG per tablet Take 1 tablet by mouth 2 (two) times daily.  14 tablet  0   No current facility-administered medications for this visit.    Allergies  Allergen Reactions  . Codeine Phosphate     REACTION: unspecified    Family  History  Problem Relation Age of Onset  . Adopted: Yes  . Heart disease Maternal Grandfather     History   Social History  . Marital Status: Single    Spouse Name: N/A    Number of Children: N/A  . Years of Education: 12   Occupational History  . Cutter Operator    Social History Main Topics  . Smoking status: Current Every Day Smoker -- 1.50 packs/day for 40 years  . Smokeless tobacco: Never Used  . Alcohol Use: 2.4 oz/week    4 Cans of beer per week  . Drug Use: No  . Sexually Active: Not Currently   Other Topics Concern  . Not on file   Social History Narrative   Regular exercise-no   Caffeine Use-yes     Constitutional: Denies fever, malaise, fatigue, headache or abrupt weight changes.  HEENT: Denies eye pain, eye redness, ear pain, ringing in the ears, wax buildup, runny nose, nasal congestion, bloody nose, or sore throat. Respiratory: Denies difficulty breathing, shortness of breath, cough or sputum production.   Cardiovascular: Denies chest pain, chest tightness, palpitations or swelling in the hands or feet.   Skin: Pt reports ulcerations to BLE.   Neurological: Denies dizziness, difficulty with memory, difficulty with speech or  problems with balance and coordination.  Psych: Pt reports insomnia, stress and anxiety. Denies depression, SI/HI.  No other specific complaints in a complete review of systems (except as listed in HPI above).     Objective:   Physical Exam  BP 136/72  Pulse 74  Resp 16  Wt 155 lb 1.9 oz (70.362 kg)  BMI 23.59 kg/m2  SpO2 94% Wt Readings from Last 3 Encounters:  01/31/13 155 lb 1.9 oz (70.362 kg)  12/30/12 161 lb 12 oz (73.369 kg)  05/23/09 199 lb (90.266 kg)    General: Appears his stated age, well developed, well nourished in NAD. Skin: Warm, dry and intact. Pretibial ulcerations noted on BLE. HEENT: Head: normal shape and size; Eyes: sclera white, no icterus, conjunctiva pink, PERRLA and EOMs intact; Ears: Tm's gray and  intact, normal light reflex; Nose: mucosa pink and moist, septum midline; Throat/Mouth: Teeth present, mucosa pink and moist, no exudate, lesions or ulcerations noted.  Cardiovascular: Normal rate and rhythm. S1,S2 noted.  No murmur, rubs or gallops noted. No JVD or BLE edema. No carotid bruits noted. Pedal pulses 1+ bilaterally, radials 2+ bilaterally. Pulmonary/Chest: Normal effort and positive vesicular breath sounds. No respiratory distress. No wheezes, rales or ronchi noted.   Neurological: Alert and oriented. Cranial nerves II-XII intact. Coordination normal. +DTRs bilaterally. Psychiatric: Mood anxious and affect normal. Behavior is normal. Judgment and thought content normal.     BMET    Component Value Date/Time   NA 136 12/30/2012 1019   K 5.1 12/30/2012 1019   CL 93* 12/30/2012 1019   CO2 37* 12/30/2012 1019   GLUCOSE 86 12/30/2012 1019   BUN 12 12/30/2012 1019   CREATININE 0.9 12/30/2012 1019   CALCIUM 9.1 12/30/2012 1019   GFRNONAA >90 08/05/2012 1430   GFRAA >90 08/05/2012 1430    Lipid Panel     Component Value Date/Time   CHOL 181 12/30/2012 1019   TRIG 59.0 12/30/2012 1019   HDL 73.00 12/30/2012 1019   CHOLHDL 2 12/30/2012 1019   VLDL 11.8 12/30/2012 1019   LDLCALC 96 12/30/2012 1019    CBC    Component Value Date/Time   WBC 6.2 12/30/2012 1019   RBC 4.72 12/30/2012 1019   HGB 16.4 12/30/2012 1019   HCT 49.6 12/30/2012 1019   PLT 214.0 12/30/2012 1019   MCV 105.2* 12/30/2012 1019   MCH 35.0* 08/05/2012 1430   MCHC 33.1 12/30/2012 1019   RDW 14.7* 12/30/2012 1019   LYMPHSABS 1.0 08/05/2012 1430   MONOABS 0.7 08/05/2012 1430   EOSABS 0.0 08/05/2012 1430   BASOSABS 0.0 08/05/2012 1430    Hgb A1C Lab Results  Component Value Date   HGBA1C 5.1 12/30/2012         Assessment & Plan:

## 2013-02-07 ENCOUNTER — Telehealth: Payer: Self-pay | Admitting: *Deleted

## 2013-02-07 ENCOUNTER — Other Ambulatory Visit: Payer: Self-pay | Admitting: Internal Medicine

## 2013-02-07 DIAGNOSIS — T148XXA Other injury of unspecified body region, initial encounter: Secondary | ICD-10-CM

## 2013-02-07 NOTE — Telephone Encounter (Signed)
Pt is requesting referral for wound care at [facility] states that if possible, he can go tomorrow or Wednesday of this week for his appointment since he is off work.

## 2013-02-07 NOTE — Telephone Encounter (Signed)
Ordered

## 2013-02-07 NOTE — Telephone Encounter (Signed)
Pt informed

## 2013-02-15 ENCOUNTER — Encounter: Payer: Self-pay | Admitting: Cardiovascular Disease

## 2013-02-15 ENCOUNTER — Ambulatory Visit (INDEPENDENT_AMBULATORY_CARE_PROVIDER_SITE_OTHER): Payer: PRIVATE HEALTH INSURANCE | Admitting: Cardiovascular Disease

## 2013-02-15 ENCOUNTER — Encounter (HOSPITAL_COMMUNITY): Payer: Self-pay | Admitting: Pharmacy Technician

## 2013-02-15 VITALS — BP 156/82 | HR 76 | Ht 68.0 in | Wt 156.0 lb

## 2013-02-15 DIAGNOSIS — R0989 Other specified symptoms and signs involving the circulatory and respiratory systems: Secondary | ICD-10-CM

## 2013-02-15 DIAGNOSIS — I739 Peripheral vascular disease, unspecified: Secondary | ICD-10-CM

## 2013-02-15 DIAGNOSIS — I2510 Atherosclerotic heart disease of native coronary artery without angina pectoris: Secondary | ICD-10-CM

## 2013-02-15 LAB — CBC WITH DIFFERENTIAL/PLATELET
Basophils Absolute: 0 10*3/uL (ref 0.0–0.1)
Hemoglobin: 16.8 g/dL (ref 13.0–17.0)
Lymphocytes Relative: 15.3 % (ref 12.0–46.0)
Monocytes Relative: 9.1 % (ref 3.0–12.0)
Neutro Abs: 6.2 10*3/uL (ref 1.4–7.7)
RBC: 4.89 Mil/uL (ref 4.22–5.81)
RDW: 13.1 % (ref 11.5–14.6)

## 2013-02-15 LAB — BASIC METABOLIC PANEL
Calcium: 9.5 mg/dL (ref 8.4–10.5)
GFR: 79.33 mL/min (ref >60.00)
Glucose, Bld: 108 mg/dL — ABNORMAL HIGH (ref 70–99)
Sodium: 137 mEq/L (ref 135–145)

## 2013-02-15 MED ORDER — ASPIRIN EC 81 MG PO TBEC: 81.0000 mg | DELAYED_RELEASE_TABLET | Freq: Every day | ORAL | Status: DC

## 2013-02-15 MED ORDER — TRAMADOL HCL 50 MG PO TABS: 50.0000 mg | ORAL_TABLET | Freq: Three times a day (TID) | ORAL | Status: DC | PRN

## 2013-02-15 MED ORDER — CLOPIDOGREL BISULFATE 75 MG PO TABS: 75.0000 mg | ORAL_TABLET | Freq: Every day | ORAL | Status: DC

## 2013-02-15 NOTE — Assessment & Plan Note (Signed)
He has no convincing symptoms of angina. He does have significant exertional dyspnea which could be related to smoking. However, he has known history of coronary artery disease and diffuse atherosclerosis. Thus, most likely I will evaluate this with a stress test after dealing with his PAD.

## 2013-02-15 NOTE — Assessment & Plan Note (Addendum)
There is evidence of critical limb ischemia involving the right leg with nonhealing ulcers. He did have previous stent placement to the right SFA. I will go ahead and start him on aspirin 81 mg daily Plavix 75 mg once daily. I will also obtain an ABI in the lower extremity arterial duplex. I recommend proceeding with lower extremity angiography and possible endovascular intervention next week. I will try to expedite his appointment with the wound clinic. I discussed with him the importance of walking cessation in order to prevent progression to limb loss. He is at significant risk of this if he continues to smoke and this was explained to him. He is still does not seem to be interested in smoking cessation.

## 2013-02-15 NOTE — Patient Instructions (Addendum)
Your physician has recommended you make the following change in your medication: START Aspirin 81mg  take one by mouth daily, START Plavix 75mg  take one by mouth daily, START Tramadol 50mg  take one by mouth every 8 hours as needed for pain  Your physician recommends that you have lab work today: BMP, CBC and PT/INR  Your physician has requested that you have an ankle brachial index (ABI) prior to 02/23/13 procedure. During this test an ultrasound and blood pressure cuff are used to evaluate the arteries that supply the arms and legs with blood. Allow thirty minutes for this exam. There are no restrictions or special instructions.  Your physician has requested that you have a lower extremity arterial duplex prior to 02/23/13 procedure. This test is an ultrasound of the arteries in the legs. It looks at arterial blood flow in the legs. Allow one hour for Lower Arterial scans. There are no restrictions or special instructions  Your physician has requested that you have a peripheral vascular angiogram. This exam is performed at the hospital. During this exam IV contrast is used to look at arterial blood flow. Please review the information sheet given for details.  Your physician has requested that you have a carotid duplex (this can be done after 02/23/13). This test is an ultrasound of the carotid arteries in your neck. It looks at blood flow through these arteries that supply the brain with blood. Allow one hour for this exam. There are no restrictions or special instructions.

## 2013-02-15 NOTE — Progress Notes (Signed)
Referring provider: Nicki Reaper, NP  HPI  This is a 57 year old man who was referred for evaluation of nonhealing ulcer on the right leg. The patient has known history of coronary artery disease with previous drug-eluting stent placement to the proximal left circumflex in 2008. He also had previous stent placement to the right SFA in 2003. He has known history of hypertension, hyperlipidemia and prolonged tobacco use. He is not diaphoretic. He does have history of poor compliance with medical therapy. He noticed ulceration on the medial and lateral aspect of the right shin which started about 3 months ago and has been getting worse. He had significant discomfort in the right leg below the knee especially at night and has been using excessive amount of nonsteroidal anti-inflammatory drugs. He denies any chest pain. He has chronic dyspnea likely related to his prolonged smoking. He has no discomfort involving the left leg.  Allergies  Allergen Reactions  . Codeine Phosphate     REACTION: unspecified     Current Outpatient Prescriptions on File Prior to Visit  Medication Sig Dispense Refill  . ALPRAZolam (XANAX) 0.5 MG tablet Take 1 tablet (0.5 mg total) by mouth at bedtime as needed for sleep.  30 tablet  0  . amLODipine (NORVASC) 10 MG tablet Take 1 tablet (10 mg total) by mouth daily.  30 tablet  2  . furosemide (LASIX) 40 MG tablet Take 1 tablet (40 mg total) by mouth 2 (two) times daily.  60 tablet  2  . lisinopril (PRINIVIL,ZESTRIL) 10 MG tablet Take 2 tablets (20 mg total) by mouth daily.  60 tablet  2  . metoprolol tartrate (LOPRESSOR) 25 MG tablet Take 1 tablet (25 mg total) by mouth 2 (two) times daily.  60 tablet  2  . mirtazapine (REMERON SOL-TAB) 15 MG disintegrating tablet Take 1 tablet (15 mg total) by mouth at bedtime.  30 tablet  0   No current facility-administered medications on file prior to visit.     Past Medical History  Diagnosis Date  . Hypertension   . COPD  11/27/2007    Qualifier: Diagnosis of  By: Cheri Guppy    . ERECTILE DYSFUNCTION 11/27/2007    Qualifier: Diagnosis of  By: Cheri Guppy    . HYPERLIPIDEMIA 11/27/2007    Qualifier: Diagnosis of  By: Cheri Guppy    . HYPERTENSION, BENIGN 05/23/2009    Qualifier: Diagnosis of  By: Sherene Sires MD, Charlaine Dalton   . Asthma   . Coronary artery disease     Cardiac catheterization in 2008 showed 99% stenosis in proximal left circumflex which was treated with Taxus drug-eluting stent. There was mild RCA and LAD disease with normal ejection fraction.  Marland Kitchen PAD (peripheral artery disease)     Previous right SFA stent in 2003.  . Tobacco use      Past Surgical History  Procedure Laterality Date  . Nasal sinus surgery  2004  . Acne cyst removal    . Other surgical history      blocked artery     Family History  Problem Relation Age of Onset  . Adopted: Yes  . Heart disease Maternal Grandfather      History   Social History  . Marital Status: Single    Spouse Name: N/A    Number of Children: N/A  . Years of Education: 12   Occupational History  . Cutter Operator    Social History Main Topics  . Smoking status: Current Every Day Smoker --  1.50 packs/day for 40 years  . Smokeless tobacco: Never Used  . Alcohol Use: 2.4 oz/week    4 Cans of beer per week  . Drug Use: No  . Sexually Active: Not Currently   Other Topics Concern  . Not on file   Social History Narrative   Regular exercise-no   Caffeine Use-yes     ROS A 10 point review of system was performed. It's negative other than what is mentioned in the history of present illness.  PHYSICAL EXAM   BP 156/82  Pulse 76  Ht 5\' 8"  (1.727 m)  Wt 156 lb (70.761 kg)  BMI 23.73 kg/m2 Constitutional: He is oriented to person, place, and time. He appears well-developed and well-nourished. No distress.  HENT: No nasal discharge.  Head: Normocephalic and atraumatic.  Eyes: Pupils are equal and round. Right eye exhibits no  discharge. Left eye exhibits no discharge.  Neck: Normal range of motion. Neck supple. No JVD present. No thyromegaly present. There is a left carotid bruit. Cardiovascular: Normal rate, regular rhythm, normal heart sounds and. Exam reveals no gallop and no friction rub. No murmur heard.  Pulmonary/Chest: Effort normal and breath sounds normal. No stridor. No respiratory distress. He has no wheezes. He has no rales. He exhibits no tenderness.  Abdominal: Soft. Bowel sounds are normal. He exhibits no distension. There is no tenderness. There is no rebound and no guarding.  Musculoskeletal: Normal range of motion. He exhibits no edema and no tenderness.  Neurological: He is alert and oriented to person, place, and time. Coordination normal.  Skin: Skin is warm and dry. No rash noted. He is not diaphoretic. No erythema. No pallor.  Psychiatric: He has a normal mood and affect. His behavior is normal. Judgment and thought content normal.  Vascular: Radial pulses normal bilaterally. Femoral pulses are slightly diminished bilaterally. Posterior tibial: Absent on the right side and faint on the left side. Dorsalis pedis: Absent on the right side and faint on the left side. He has 2 ulcerations on the medial and lateral aspect of the lower part of the shin measuring about 2 x 4 cm. There is no evidence of secondary infection.     ASSESSMENT AND PLAN

## 2013-02-15 NOTE — Assessment & Plan Note (Signed)
He does have a left carotid bruit for which I recommend carotid Doppler.

## 2013-02-17 DIAGNOSIS — R0989 Other specified symptoms and signs involving the circulatory and respiratory systems: Secondary | ICD-10-CM

## 2013-02-21 ENCOUNTER — Telehealth: Payer: Self-pay | Admitting: *Deleted

## 2013-02-21 MED ORDER — LISINOPRIL 10 MG PO TABS: 20.0000 mg | ORAL_TABLET | Freq: Every day | ORAL | Status: DC

## 2013-02-21 NOTE — Telephone Encounter (Signed)
Refill done for 90 day supply.

## 2013-02-22 ENCOUNTER — Encounter (INDEPENDENT_AMBULATORY_CARE_PROVIDER_SITE_OTHER): Payer: PRIVATE HEALTH INSURANCE

## 2013-02-22 DIAGNOSIS — I739 Peripheral vascular disease, unspecified: Secondary | ICD-10-CM

## 2013-02-22 DIAGNOSIS — L98499 Non-pressure chronic ulcer of skin of other sites with unspecified severity: Secondary | ICD-10-CM

## 2013-02-23 ENCOUNTER — Encounter (HOSPITAL_COMMUNITY): Payer: Self-pay | Admitting: Pharmacy Technician

## 2013-02-23 ENCOUNTER — Encounter (HOSPITAL_COMMUNITY)
Admission: RE | Disposition: A | Discharge status: Home / Self Care | Payer: Self-pay | Source: Ambulatory Visit | Attending: Cardiovascular Disease

## 2013-02-23 ENCOUNTER — Ambulatory Visit (HOSPITAL_COMMUNITY)
Admission: RE | Admit: 2013-02-23 | Discharge: 2013-02-23 | Disposition: A | Discharge status: Home / Self Care | Payer: PRIVATE HEALTH INSURANCE | Source: Ambulatory Visit | Attending: Cardiovascular Disease | Admitting: Cardiovascular Disease

## 2013-02-23 DIAGNOSIS — I739 Peripheral vascular disease, unspecified: Secondary | ICD-10-CM

## 2013-02-23 DIAGNOSIS — Y929 Unspecified place or not applicable: Secondary | ICD-10-CM | POA: Insufficient documentation

## 2013-02-23 DIAGNOSIS — I70299 Other atherosclerosis of native arteries of extremities, unspecified extremity: Secondary | ICD-10-CM | POA: Insufficient documentation

## 2013-02-23 DIAGNOSIS — Z885 Allergy status to narcotic agent status: Secondary | ICD-10-CM | POA: Insufficient documentation

## 2013-02-23 DIAGNOSIS — J4489 Other specified chronic obstructive pulmonary disease: Secondary | ICD-10-CM | POA: Insufficient documentation

## 2013-02-23 DIAGNOSIS — I2510 Atherosclerotic heart disease of native coronary artery without angina pectoris: Secondary | ICD-10-CM | POA: Insufficient documentation

## 2013-02-23 DIAGNOSIS — T82898A Other specified complication of vascular prosthetic devices, implants and grafts, initial encounter: Secondary | ICD-10-CM | POA: Insufficient documentation

## 2013-02-23 DIAGNOSIS — N529 Male erectile dysfunction, unspecified: Secondary | ICD-10-CM | POA: Insufficient documentation

## 2013-02-23 DIAGNOSIS — I10 Essential (primary) hypertension: Secondary | ICD-10-CM | POA: Insufficient documentation

## 2013-02-23 DIAGNOSIS — Y831 Surgical operation with implant of artificial internal device as the cause of abnormal reaction of the patient, or of later complication, without mention of misadventure at the time of the procedure: Secondary | ICD-10-CM | POA: Insufficient documentation

## 2013-02-23 DIAGNOSIS — I714 Abdominal aortic aneurysm, without rupture, unspecified: Secondary | ICD-10-CM | POA: Insufficient documentation

## 2013-02-23 DIAGNOSIS — I701 Atherosclerosis of renal artery: Secondary | ICD-10-CM | POA: Insufficient documentation

## 2013-02-23 DIAGNOSIS — Z79899 Other long term (current) drug therapy: Secondary | ICD-10-CM | POA: Insufficient documentation

## 2013-02-23 DIAGNOSIS — I7092 Chronic total occlusion of artery of the extremities: Secondary | ICD-10-CM | POA: Insufficient documentation

## 2013-02-23 DIAGNOSIS — J449 Chronic obstructive pulmonary disease, unspecified: Secondary | ICD-10-CM | POA: Insufficient documentation

## 2013-02-23 DIAGNOSIS — F17200 Nicotine dependence, unspecified, uncomplicated: Secondary | ICD-10-CM | POA: Insufficient documentation

## 2013-02-23 DIAGNOSIS — L97809 Non-pressure chronic ulcer of other part of unspecified lower leg with unspecified severity: Secondary | ICD-10-CM | POA: Insufficient documentation

## 2013-02-23 DIAGNOSIS — E785 Hyperlipidemia, unspecified: Secondary | ICD-10-CM | POA: Insufficient documentation

## 2013-02-23 DIAGNOSIS — Z9861 Coronary angioplasty status: Secondary | ICD-10-CM | POA: Insufficient documentation

## 2013-02-23 DIAGNOSIS — L98499 Non-pressure chronic ulcer of skin of other sites with unspecified severity: Secondary | ICD-10-CM

## 2013-02-23 HISTORY — PX: ABDOMINAL AORTAGRAM: SHX5454

## 2013-02-23 SURGERY — ABDOMINAL AORTAGRAM
Anesthesia: LOCAL

## 2013-02-23 MED ORDER — ASPIRIN 81 MG PO CHEW
CHEWABLE_TABLET | ORAL | Status: AC
  Filled 2013-02-23: qty 4

## 2013-02-23 MED ORDER — SODIUM CHLORIDE 0.9 % IV SOLN: 250.0000 mL | INTRAVENOUS | Status: DC | PRN

## 2013-02-23 MED ORDER — LIDOCAINE HCL (PF) 1 % IJ SOLN
INTRAMUSCULAR | Status: AC
  Filled 2013-02-23: qty 30

## 2013-02-23 MED ORDER — SODIUM CHLORIDE 0.9 % IV SOLN: INTRAVENOUS | Status: DC

## 2013-02-23 MED ORDER — ASPIRIN 81 MG PO CHEW
324.0000 mg | CHEWABLE_TABLET | ORAL | Status: AC
  Administered 2013-02-23: 324 mg via ORAL

## 2013-02-23 MED ORDER — SODIUM CHLORIDE 0.9 % IJ SOLN: 3.0000 mL | INTRAMUSCULAR | Status: DC | PRN

## 2013-02-23 MED ORDER — FENTANYL CITRATE 0.05 MG/ML IJ SOLN
INTRAMUSCULAR | Status: AC
Start: 2013-02-23 — End: 2013-02-23
  Filled 2013-02-23: qty 2

## 2013-02-23 MED ORDER — ACETAMINOPHEN 325 MG PO TABS: 650.0000 mg | ORAL_TABLET | ORAL | Status: DC | PRN

## 2013-02-23 MED ORDER — SODIUM CHLORIDE 0.9 % IJ SOLN: 3.0000 mL | Freq: Two times a day (BID) | INTRAMUSCULAR | Status: DC

## 2013-02-23 MED ORDER — MIDAZOLAM HCL 2 MG/2ML IJ SOLN
INTRAMUSCULAR | Status: AC
  Filled 2013-02-23: qty 2

## 2013-02-23 MED ORDER — SODIUM CHLORIDE 0.9 % IV SOLN
INTRAVENOUS | Status: DC
  Administered 2013-02-23: 1000 mL via INTRAVENOUS

## 2013-02-23 NOTE — H&P (View-Only) (Signed)
 Referring provider: Regina Baity, NP  HPI  This is a 57-year-old man who was referred for evaluation of nonhealing ulcer on the right leg. The patient has known history of coronary artery disease with previous drug-eluting stent placement to the proximal left circumflex in 2008. He also had previous stent placement to the right SFA in 2003. He has known history of hypertension, hyperlipidemia and prolonged tobacco use. He is not diaphoretic. He does have history of poor compliance with medical therapy. He noticed ulceration on the medial and lateral aspect of the right shin which started about 3 months ago and has been getting worse. He had significant discomfort in the right leg below the knee especially at night and has been using excessive amount of nonsteroidal anti-inflammatory drugs. He denies any chest pain. He has chronic dyspnea likely related to his prolonged smoking. He has no discomfort involving the left leg.  Allergies  Allergen Reactions  . Codeine Phosphate     REACTION: unspecified     Current Outpatient Prescriptions on File Prior to Visit  Medication Sig Dispense Refill  . ALPRAZolam (XANAX) 0.5 MG tablet Take 1 tablet (0.5 mg total) by mouth at bedtime as needed for sleep.  30 tablet  0  . amLODipine (NORVASC) 10 MG tablet Take 1 tablet (10 mg total) by mouth daily.  30 tablet  2  . furosemide (LASIX) 40 MG tablet Take 1 tablet (40 mg total) by mouth 2 (two) times daily.  60 tablet  2  . lisinopril (PRINIVIL,ZESTRIL) 10 MG tablet Take 2 tablets (20 mg total) by mouth daily.  60 tablet  2  . metoprolol tartrate (LOPRESSOR) 25 MG tablet Take 1 tablet (25 mg total) by mouth 2 (two) times daily.  60 tablet  2  . mirtazapine (REMERON SOL-TAB) 15 MG disintegrating tablet Take 1 tablet (15 mg total) by mouth at bedtime.  30 tablet  0   No current facility-administered medications on file prior to visit.     Past Medical History  Diagnosis Date  . Hypertension   . COPD  11/27/2007    Qualifier: Diagnosis of  By: Alleyne, Tiffany    . ERECTILE DYSFUNCTION 11/27/2007    Qualifier: Diagnosis of  By: Alleyne, Tiffany    . HYPERLIPIDEMIA 11/27/2007    Qualifier: Diagnosis of  By: Alleyne, Tiffany    . HYPERTENSION, BENIGN 05/23/2009    Qualifier: Diagnosis of  By: Wert MD, Michael B   . Asthma   . Coronary artery disease     Cardiac catheterization in 2008 showed 99% stenosis in proximal left circumflex which was treated with Taxus drug-eluting stent. There was mild RCA and LAD disease with normal ejection fraction.  . PAD (peripheral artery disease)     Previous right SFA stent in 2003.  . Tobacco use      Past Surgical History  Procedure Laterality Date  . Nasal sinus surgery  2004  . Acne cyst removal    . Other surgical history      blocked artery     Family History  Problem Relation Age of Onset  . Adopted: Yes  . Heart disease Maternal Grandfather      History   Social History  . Marital Status: Single    Spouse Name: N/A    Number of Children: N/A  . Years of Education: 12   Occupational History  . Cutter Operator    Social History Main Topics  . Smoking status: Current Every Day Smoker --   1.50 packs/day for 40 years  . Smokeless tobacco: Never Used  . Alcohol Use: 2.4 oz/week    4 Cans of beer per week  . Drug Use: No  . Sexually Active: Not Currently   Other Topics Concern  . Not on file   Social History Narrative   Regular exercise-no   Caffeine Use-yes     ROS A 10 point review of system was performed. It's negative other than what is mentioned in the history of present illness.  PHYSICAL EXAM   BP 156/82  Pulse 76  Ht 5' 8" (1.727 m)  Wt 156 lb (70.761 kg)  BMI 23.73 kg/m2 Constitutional: He is oriented to person, place, and time. He appears well-developed and well-nourished. No distress.  HENT: No nasal discharge.  Head: Normocephalic and atraumatic.  Eyes: Pupils are equal and round. Right eye exhibits no  discharge. Left eye exhibits no discharge.  Neck: Normal range of motion. Neck supple. No JVD present. No thyromegaly present. There is a left carotid bruit. Cardiovascular: Normal rate, regular rhythm, normal heart sounds and. Exam reveals no gallop and no friction rub. No murmur heard.  Pulmonary/Chest: Effort normal and breath sounds normal. No stridor. No respiratory distress. He has no wheezes. He has no rales. He exhibits no tenderness.  Abdominal: Soft. Bowel sounds are normal. He exhibits no distension. There is no tenderness. There is no rebound and no guarding.  Musculoskeletal: Normal range of motion. He exhibits no edema and no tenderness.  Neurological: He is alert and oriented to person, place, and time. Coordination normal.  Skin: Skin is warm and dry. No rash noted. He is not diaphoretic. No erythema. No pallor.  Psychiatric: He has a normal mood and affect. His behavior is normal. Judgment and thought content normal.  Vascular: Radial pulses normal bilaterally. Femoral pulses are slightly diminished bilaterally. Posterior tibial: Absent on the right side and faint on the left side. Dorsalis pedis: Absent on the right side and faint on the left side. He has 2 ulcerations on the medial and lateral aspect of the lower part of the shin measuring about 2 x 4 cm. There is no evidence of secondary infection.     ASSESSMENT AND PLAN   

## 2013-02-23 NOTE — Interval H&P Note (Signed)
History and Physical Interval Note:  02/23/2013 8:43 AM  Edwin [name]  has presented today for surgery, with the diagnosis of pvd  The various methods of treatment have been discussed with the patient and family. After consideration of risks, benefits and other options for treatment, the patient has consented to  Procedure(s): ABDOMINAL AORTAGRAM (N/A) as a surgical intervention .  The patient's history has been reviewed, patient examined, no change in status, stable for surgery.  I have reviewed the patient's chart and labs.  Questions were answered to the patient's satisfaction.     Lorine Bears

## 2013-02-23 NOTE — CV Procedure (Signed)
PERIPHERAL VASCULAR PROCEDURE  NAME:  WANDELL SCULLION   MRN: [id number] DOB:  11-01-1955   ADMIT DATE: 02/23/2013  Performing Cardiologist: Lorine Bears Primary Physician: Nicki Reaper, NP   Procedures Performed:  Abdominal Aortic Angiogram with Bi-Iliofemoral Runoff  Bilateral Lower Extremity Angiography (1st Order)  Second Order right Lower Extremity Angiography    Indication(s):   Critical Limb Ischemia with nonhealing ulceration   Consent: The procedure with Risks/Benefits/Alternatives and Indications was reviewed with the patient .  All questions were answered.  Medications:  Sedation:  1 and mg IV Versed, 25 mcg IV Fentanyl  Contrast:  130 ml  Visipaque   Procedural details: The left groin was prepped, draped, and anesthetized with 1% lidocaine. Using modified Seldinger technique, a 5  French sheath was introduced into the left common femoral artery. A 5 Fr Short Pigtail Catheter was advanced of over a  Versicore wire into the descending Aorta to a level just above the renal arteries. A power injection of 12ml/sec contrast over 1 sec was performed for Abdominal Aortic Angiography.  The catheter was then pulled back to a level just above the Aortic bifurcation, and a second power injection was performed to evaluate the iliac arteries.   Bilateral lower extremity arterial run off was then performed via power injection of 7 ml / sec contrast for a total of 77 ml.  The pigtail catheter was changed over the Versicore wire for A crossover catheter which was then pulled back the aortic bifurcation and the wire was advanced down the contralateral common iliac artery.  The wire was then advanced to the contralateral common femoral artery, the catheter was exchanged into an end hole straight tip catheter which was advanced over the wire to the common femoral artery. Contralateral SFA angiography was performed in the RAO position via power injection.   The catheter was then  removed with pressure pullback performed across the iliac stenosis.  The catheter was then removed. The sheath will be removed manually.  Hemodynamics:  Central Aortic Pressure / Mean Aortic Pressure: 146/70  Findings:  Abdominal aorta: Mild aneurysmal dilatation below the renal arteries with diffuse atherosclerosis.  Left renal artery: Normal in size with 95% proximal stenosis  Right renal artery: 2 or 3 right renal arteries with mild proximal disease.  Celiac artery: Not well visualized.  Superior mesenteric artery: Not well visualized.  Right common iliac artery: 50% ostial stenosis with moderate calcifications.  Right internal iliac artery: Diffuse significant disease in the proximal and midsegment.  Right external iliac artery: Diffuse 20% disease.  Right common femoral artery: Minor diffuse disease.  Right profunda femoral artery: Normal  Right superficial femoral artery: Normal in size with diffuse 60% disease stopping at the ostium. This was followed by a 99% discrete stenosis. The mid segment has diffuse 80% disease proximal to a previously placed stent. There is 40% in-stent restenosis in the mid to distal SFA.  Right popliteal artery: A 30% discrete stenosis.  2 vessel runoff below the knee with an occluded posterior tibial which reconstitutes via collaterals at the ankle. A dominant vessel is a peroneal artery. The anterior tibial has diffuse 60% disease in the midsegment.  Left common iliac artery:  Diffuse 20% disease.  Left internal iliac artery: Diffusely diseased throughout its course.  Left external iliac artery: 60% disease proximally.  Left common femoral artery: Diffuse 40% disease.  Left profunda femoral artery: Normal  Left superficial femoral artery:  Normal in size with 40% disease  in the midsegment.  Left popliteal artery: Minor irregularities.  Three-vessel runoff below the knee with 90% disease in proximal peroneal  artery.  Conclusions: 1. Severe left renal artery stenosis. 2. Moderate right common iliac artery stenosis with a peak systolic gradient of 10 mm hg. 3. Severe right SFA disease in 2 different locations with two-vessel runoff below the knee.  Recommendations:  Right SFA atherectomy is recommended which will be scheduled for next week. Consider left renal artery stenting in the near future.   Lorine Bears, MD, [facility] 02/23/2013 9:20 AM

## 2013-02-23 NOTE — Progress Notes (Signed)
Notified Dr Kirke Corin that patient rode a cab to the hospital and that he planned to ride a cab back home. Dr Kirke Corin says he will wait to after procedure to decide what to do.

## 2013-02-26 ENCOUNTER — Other Ambulatory Visit: Payer: Self-pay | Admitting: Internal Medicine

## 2013-02-28 ENCOUNTER — Other Ambulatory Visit: Payer: Self-pay | Admitting: Internal Medicine

## 2013-02-28 ENCOUNTER — Encounter (HOSPITAL_BASED_OUTPATIENT_CLINIC_OR_DEPARTMENT_OTHER): Payer: PRIVATE HEALTH INSURANCE | Attending: General Surgery

## 2013-02-28 DIAGNOSIS — I739 Peripheral vascular disease, unspecified: Secondary | ICD-10-CM | POA: Insufficient documentation

## 2013-02-28 DIAGNOSIS — Z79899 Other long term (current) drug therapy: Secondary | ICD-10-CM | POA: Insufficient documentation

## 2013-02-28 DIAGNOSIS — J449 Chronic obstructive pulmonary disease, unspecified: Secondary | ICD-10-CM | POA: Insufficient documentation

## 2013-02-28 DIAGNOSIS — J4489 Other specified chronic obstructive pulmonary disease: Secondary | ICD-10-CM | POA: Insufficient documentation

## 2013-02-28 DIAGNOSIS — I10 Essential (primary) hypertension: Secondary | ICD-10-CM | POA: Insufficient documentation

## 2013-02-28 DIAGNOSIS — F17200 Nicotine dependence, unspecified, uncomplicated: Secondary | ICD-10-CM | POA: Insufficient documentation

## 2013-02-28 DIAGNOSIS — L97809 Non-pressure chronic ulcer of other part of unspecified lower leg with unspecified severity: Secondary | ICD-10-CM | POA: Insufficient documentation

## 2013-03-01 ENCOUNTER — Other Ambulatory Visit: Payer: Self-pay | Admitting: Cardiovascular Disease

## 2013-03-01 ENCOUNTER — Telehealth: Payer: Self-pay | Admitting: *Deleted

## 2013-03-01 DIAGNOSIS — I739 Peripheral vascular disease, unspecified: Secondary | ICD-10-CM

## 2013-03-01 NOTE — Telephone Encounter (Signed)
Orders for a procedure - Atherectomy.

## 2013-03-01 NOTE — Telephone Encounter (Signed)
The hospital is calling because this pt does not have orders in the system for 03/02/13 procedure with Dr Kirke Corin.  I will forward this message to Dr Kirke Corin so that the pt's orders can be entered.

## 2013-03-01 NOTE — Progress Notes (Signed)
[facility]  NAME:  Edwin [name], BERG              ACCOUNT NO.:  [id number]  MEDICAL RECORD NO.:  [id number]      DATE OF BIRTH:  [birth year]  PHYSICIAN:  Ardath Sax, M.D.           VISIT DATE:                                  OFFICE VISIT   [name] is a 57 year old gentleman who comes to [place] because of ulcers on his right leg.  These have been attributed to arterial insufficiency.  He has already had 1 stent placed in his superficial femoral artery in the past and this week he is scheduled to have another stent placed in the right leg, probably in the area of the popliteal. When he was examined here today, he was found to have a blood pressure of 166/98, respirations 16, pulse 80, temperature 98.9, he weighs 156 pounds, and he is 5 feet 8 inches tall.  He has a history of hypertension, COPD, asthma along with this peripheral vascular disease. He is a heavy smoker.  He is on Norvasc, Lasix, and Lopressor.  On examination, he had an ulcer about 3 cm x 1 cm on the anterior right leg, and 5 cm x 3 cm on the lateral leg.  I debrided it with a curette, and we dressed it with Santyl.  We will see him after he has his stent placed this week and in the meantime, we put on a Santyl dressing.     Ardath Sax, M.D.     PP/MEDQ  D:  02/28/2013  T:  03/01/2013  Job:  [id number]

## 2013-03-02 ENCOUNTER — Encounter (HOSPITAL_COMMUNITY)
Admission: RE | Disposition: A | Discharge status: Home / Self Care | Payer: Self-pay | Source: Ambulatory Visit | Attending: Cardiovascular Disease

## 2013-03-02 ENCOUNTER — Ambulatory Visit (HOSPITAL_COMMUNITY)
Admission: RE | Admit: 2013-03-02 | Discharge: 2013-03-02 | Disposition: A | Discharge status: Home / Self Care | Payer: PRIVATE HEALTH INSURANCE | Source: Ambulatory Visit | Attending: Cardiovascular Disease | Admitting: Cardiovascular Disease

## 2013-03-02 DIAGNOSIS — L97809 Non-pressure chronic ulcer of other part of unspecified lower leg with unspecified severity: Secondary | ICD-10-CM | POA: Insufficient documentation

## 2013-03-02 DIAGNOSIS — L98499 Non-pressure chronic ulcer of skin of other sites with unspecified severity: Secondary | ICD-10-CM | POA: Insufficient documentation

## 2013-03-02 DIAGNOSIS — J4489 Other specified chronic obstructive pulmonary disease: Secondary | ICD-10-CM | POA: Insufficient documentation

## 2013-03-02 DIAGNOSIS — E785 Hyperlipidemia, unspecified: Secondary | ICD-10-CM | POA: Insufficient documentation

## 2013-03-02 DIAGNOSIS — I70219 Atherosclerosis of native arteries of extremities with intermittent claudication, unspecified extremity: Secondary | ICD-10-CM

## 2013-03-02 DIAGNOSIS — N529 Male erectile dysfunction, unspecified: Secondary | ICD-10-CM | POA: Insufficient documentation

## 2013-03-02 DIAGNOSIS — I739 Peripheral vascular disease, unspecified: Secondary | ICD-10-CM | POA: Insufficient documentation

## 2013-03-02 DIAGNOSIS — Z79899 Other long term (current) drug therapy: Secondary | ICD-10-CM | POA: Insufficient documentation

## 2013-03-02 DIAGNOSIS — Z9861 Coronary angioplasty status: Secondary | ICD-10-CM | POA: Insufficient documentation

## 2013-03-02 DIAGNOSIS — F17200 Nicotine dependence, unspecified, uncomplicated: Secondary | ICD-10-CM | POA: Insufficient documentation

## 2013-03-02 DIAGNOSIS — Z885 Allergy status to narcotic agent status: Secondary | ICD-10-CM | POA: Insufficient documentation

## 2013-03-02 DIAGNOSIS — J449 Chronic obstructive pulmonary disease, unspecified: Secondary | ICD-10-CM | POA: Insufficient documentation

## 2013-03-02 DIAGNOSIS — Z7902 Long term (current) use of antithrombotics/antiplatelets: Secondary | ICD-10-CM | POA: Insufficient documentation

## 2013-03-02 DIAGNOSIS — I2510 Atherosclerotic heart disease of native coronary artery without angina pectoris: Secondary | ICD-10-CM | POA: Insufficient documentation

## 2013-03-02 DIAGNOSIS — I10 Essential (primary) hypertension: Secondary | ICD-10-CM | POA: Insufficient documentation

## 2013-03-02 HISTORY — PX: ATHERECTOMY: SHX5502

## 2013-03-02 LAB — BASIC METABOLIC PANEL
CO2: 30 mEq/L (ref 19–32)
Chloride: 96 mEq/L (ref 96–112)
Creatinine, Ser: 1.26 mg/dL (ref 0.50–1.35)

## 2013-03-02 LAB — CBC
Hemoglobin: 16.6 g/dL (ref 13.0–17.0)
MCH: 33.9 pg (ref 26.0–34.0)
MCHC: 35 g/dL (ref 30.0–36.0)
Platelets: 196 10*3/uL (ref 150–400)
RDW: 13.2 % (ref 11.5–15.5)

## 2013-03-02 LAB — POCT ACTIVATED CLOTTING TIME
Activated Clotting Time: 237 seconds
Activated Clotting Time: 181 seconds
Activated Clotting Time: 160 seconds

## 2013-03-02 LAB — PROTIME-INR: Prothrombin Time: 11.3 seconds — ABNORMAL LOW (ref 11.6–15.2)

## 2013-03-02 SURGERY — ATHERECTOMY
Anesthesia: LOCAL

## 2013-03-02 MED ORDER — HYDRALAZINE HCL 20 MG/ML IJ SOLN
10.0000 mg | INTRAMUSCULAR | Status: DC | PRN
  Administered 2013-03-02: 10 mg via INTRAVENOUS

## 2013-03-02 MED ORDER — SODIUM CHLORIDE 0.9 % IV SOLN: 250.0000 mL | INTRAVENOUS | Status: DC | PRN

## 2013-03-02 MED ORDER — LABETALOL HCL 5 MG/ML IV SOLN: 10.0000 mg | Freq: Once | INTRAVENOUS | Status: DC

## 2013-03-02 MED ORDER — METOPROLOL TARTRATE 25 MG PO TABS
25.0000 mg | ORAL_TABLET | Freq: Once | ORAL | Status: AC
  Administered 2013-03-02: 25 mg via ORAL

## 2013-03-02 MED ORDER — SODIUM CHLORIDE 0.9 % IV SOLN
INTRAVENOUS | Status: DC
  Administered 2013-03-02: 07:00:00 via INTRAVENOUS

## 2013-03-02 MED ORDER — ASPIRIN 81 MG PO CHEW
CHEWABLE_TABLET | ORAL | Status: AC
  Filled 2013-03-02: qty 4

## 2013-03-02 MED ORDER — HEPARIN SODIUM (PORCINE) 1000 UNIT/ML IJ SOLN
INTRAMUSCULAR | Status: AC
  Filled 2013-03-02: qty 1

## 2013-03-02 MED ORDER — ONDANSETRON HCL 4 MG/2ML IJ SOLN: 4.0000 mg | Freq: Four times a day (QID) | INTRAMUSCULAR | Status: DC | PRN

## 2013-03-02 MED ORDER — CLOPIDOGREL BISULFATE 300 MG PO TABS
ORAL_TABLET | ORAL | Status: AC
  Filled 2013-03-02: qty 1

## 2013-03-02 MED ORDER — ASPIRIN 81 MG PO CHEW
324.0000 mg | CHEWABLE_TABLET | ORAL | Status: AC
  Administered 2013-03-02: 324 mg via ORAL

## 2013-03-02 MED ORDER — MIDAZOLAM HCL 2 MG/2ML IJ SOLN
INTRAMUSCULAR | Status: AC
  Filled 2013-03-02: qty 2

## 2013-03-02 MED ORDER — SODIUM CHLORIDE 0.9 % IJ SOLN: 3.0000 mL | Freq: Two times a day (BID) | INTRAMUSCULAR | Status: DC

## 2013-03-02 MED ORDER — FENTANYL CITRATE 0.05 MG/ML IJ SOLN
INTRAMUSCULAR | Status: AC
  Filled 2013-03-02: qty 2

## 2013-03-02 MED ORDER — LIDOCAINE HCL (PF) 1 % IJ SOLN
INTRAMUSCULAR | Status: AC
  Filled 2013-03-02: qty 30

## 2013-03-02 MED ORDER — ACETAMINOPHEN 325 MG PO TABS: 650.0000 mg | ORAL_TABLET | ORAL | Status: DC | PRN

## 2013-03-02 MED ORDER — MORPHINE SULFATE 2 MG/ML IJ SOLN
INTRAMUSCULAR | Status: AC
  Filled 2013-03-02: qty 1

## 2013-03-02 MED ORDER — METOPROLOL TARTRATE 12.5 MG HALF TABLET
ORAL_TABLET | ORAL | Status: AC
  Filled 2013-03-02: qty 2

## 2013-03-02 MED ORDER — SODIUM CHLORIDE 0.9 % IV SOLN: INTRAVENOUS | Status: AC

## 2013-03-02 MED ORDER — SODIUM CHLORIDE 0.9 % IJ SOLN: 3.0000 mL | INTRAMUSCULAR | Status: DC | PRN

## 2013-03-02 MED ORDER — MORPHINE SULFATE 2 MG/ML IJ SOLN
2.0000 mg | INTRAMUSCULAR | Status: DC | PRN
  Administered 2013-03-02: 2 mg via INTRAVENOUS

## 2013-03-02 NOTE — Progress Notes (Signed)
Dr Kirke Corin notified pt SBP 175, give lopressor 2 po now, give 10 iv labetalol if sbp still greater than 150.  Will continue to monitor

## 2013-03-02 NOTE — CV Procedure (Signed)
     PERIPHERAL VASCULAR PROCEDURE  NAME:  Edwin [name]   MRN: [id number] DOB:  [birth year]   ADMIT DATE: 03/02/2013  Performing Cardiologist: Lorine Bears Primary Physician: Nicki Reaper, NP  Procedures Performed:  Selective right lower extremity arterial angiography  Directional atherectomy of the right proximal and distal SFA using distal protection device  Chocolate balloon angioplasty of the proximal and distal SFA   Indication(s):   Claudication  Critical Limb Ischemia with nonhealing lower extremity ulceration. Please see my note from clinic and diagnostic angiography from last week    Consent: The procedure with Risks/Benefits/Alternatives and Indications was reviewed with the patient .  All questions were answered.  Medications:   Contrast:   95 mL   Visipaque   Procedural details: The  left  groin was prepped, draped, and anesthetized with 1% lidocaine. Using modified Seldinger technique, a  7  [place]  Destination 45 cm sheath was introduced into the left common  common femoral artery. a crossover catheter was used with the versicolor wire to engage the right common iliac artery. The wire was advanced. The sheath was advanced over the wire and placed with its tip in the right common femoral artery. Selective angiography was performed. The patient was given a total of 9000 units of unfractionated heparin to maintain an ACT above 200. The lesion was crossed with a glide wire over an angled CXI catheter. The tip was placed in the proximal right detail artery. Angiography through the catheter confirmed intraluminal position. I then advanced a 7 mm spider Fx distal protection device through the catheter which was deployed in the proximal right popliteal artery. Dissection of atherectomy was then performed with a large extended smooth cutter. A total of 4 runs were performed approximately and 3 runs were performed distally and inside the stent. Angiography showed significant  improvement but 30-40% residual stenosis. Both areas were ballooned with a chocolate balloon 6 x 80 mm to 6 atmospheres with very good results. There was a circumferential calcification noted in the distal area of the proximal lesion. This was fully expanded with the balloon and it was not flow-limiting. Thus, I did not feel that placing a stent was needed. The filter device was achieved with the retrieval. The sheath was then pulled back to the left iliac artery to be removed manually.    Hemodynamics:  Central Aortic Pressure / Mean Aortic Pressure: 136/79   Conclusions:  Successful directional atherectomy of the right SFA with balloon angioplasty.   Recommendations:  Continue dual antiplatelet therapy and aggressive treatment of risk factors   Lorine Bears, MD, [facility] 03/02/2013 10:21 AM

## 2013-03-02 NOTE — Progress Notes (Signed)
Assumed care of pt from Shalo Smith, RN. Assessment documented. 

## 2013-03-02 NOTE — Interval H&P Note (Signed)
History and Physical Interval Note:  03/02/2013 8:40 AM  Edwin [name]  has presented today for surgery, with the diagnosis of pvd  The various methods of treatment have been discussed with the patient and family. After consideration of risks, benefits and other options for treatment, the patient has consented to  Procedure(s): ATHERECTOMY (N/A) as a surgical intervention .  The patient's history has been reviewed, patient examined, no change in status, stable for surgery.  I have reviewed the patient's chart and labs.  Questions were answered to the patient's satisfaction.     Lorine Bears

## 2013-03-02 NOTE — H&P (View-Only) (Signed)
 Referring provider: Regina Baity, NP  HPI  This is a 57-year-old man who was referred for evaluation of nonhealing ulcer on the right leg. The patient has known history of coronary artery disease with previous drug-eluting stent placement to the proximal left circumflex in 2008. He also had previous stent placement to the right SFA in 2003. He has known history of hypertension, hyperlipidemia and prolonged tobacco use. He is not diaphoretic. He does have history of poor compliance with medical therapy. He noticed ulceration on the medial and lateral aspect of the right shin which started about 3 months ago and has been getting worse. He had significant discomfort in the right leg below the knee especially at night and has been using excessive amount of nonsteroidal anti-inflammatory drugs. He denies any chest pain. He has chronic dyspnea likely related to his prolonged smoking. He has no discomfort involving the left leg.  Allergies  Allergen Reactions  . Codeine Phosphate     REACTION: unspecified     Current Outpatient Prescriptions on File Prior to Visit  Medication Sig Dispense Refill  . ALPRAZolam (XANAX) 0.5 MG tablet Take 1 tablet (0.5 mg total) by mouth at bedtime as needed for sleep.  30 tablet  0  . amLODipine (NORVASC) 10 MG tablet Take 1 tablet (10 mg total) by mouth daily.  30 tablet  2  . furosemide (LASIX) 40 MG tablet Take 1 tablet (40 mg total) by mouth 2 (two) times daily.  60 tablet  2  . lisinopril (PRINIVIL,ZESTRIL) 10 MG tablet Take 2 tablets (20 mg total) by mouth daily.  60 tablet  2  . metoprolol tartrate (LOPRESSOR) 25 MG tablet Take 1 tablet (25 mg total) by mouth 2 (two) times daily.  60 tablet  2  . mirtazapine (REMERON SOL-TAB) 15 MG disintegrating tablet Take 1 tablet (15 mg total) by mouth at bedtime.  30 tablet  0   No current facility-administered medications on file prior to visit.     Past Medical History  Diagnosis Date  . Hypertension   . COPD  11/27/2007    Qualifier: Diagnosis of  By: Alleyne, Tiffany    . ERECTILE DYSFUNCTION 11/27/2007    Qualifier: Diagnosis of  By: Alleyne, Tiffany    . HYPERLIPIDEMIA 11/27/2007    Qualifier: Diagnosis of  By: Alleyne, Tiffany    . HYPERTENSION, BENIGN 05/23/2009    Qualifier: Diagnosis of  By: Wert MD, Michael B   . Asthma   . Coronary artery disease     Cardiac catheterization in 2008 showed 99% stenosis in proximal left circumflex which was treated with Taxus drug-eluting stent. There was mild RCA and LAD disease with normal ejection fraction.  . PAD (peripheral artery disease)     Previous right SFA stent in 2003.  . Tobacco use      Past Surgical History  Procedure Laterality Date  . Nasal sinus surgery  2004  . Acne cyst removal    . Other surgical history      blocked artery     Family History  Problem Relation Age of Onset  . Adopted: Yes  . Heart disease Maternal Grandfather      History   Social History  . Marital Status: Single    Spouse Name: N/A    Number of Children: N/A  . Years of Education: 12   Occupational History  . Cutter Operator    Social History Main Topics  . Smoking status: Current Every Day Smoker --   1.50 packs/day for 40 years  . Smokeless tobacco: Never Used  . Alcohol Use: 2.4 oz/week    4 Cans of beer per week  . Drug Use: No  . Sexually Active: Not Currently   Other Topics Concern  . Not on file   Social History Narrative   Regular exercise-no   Caffeine Use-yes     ROS A 10 point review of system was performed. It's negative other than what is mentioned in the history of present illness.  PHYSICAL EXAM   BP 156/82  Pulse 76  Ht 5' 8" (1.727 m)  Wt 156 lb (70.761 kg)  BMI 23.73 kg/m2 Constitutional: He is oriented to person, place, and time. He appears well-developed and well-nourished. No distress.  HENT: No nasal discharge.  Head: Normocephalic and atraumatic.  Eyes: Pupils are equal and round. Right eye exhibits no  discharge. Left eye exhibits no discharge.  Neck: Normal range of motion. Neck supple. No JVD present. No thyromegaly present. There is a left carotid bruit. Cardiovascular: Normal rate, regular rhythm, normal heart sounds and. Exam reveals no gallop and no friction rub. No murmur heard.  Pulmonary/Chest: Effort normal and breath sounds normal. No stridor. No respiratory distress. He has no wheezes. He has no rales. He exhibits no tenderness.  Abdominal: Soft. Bowel sounds are normal. He exhibits no distension. There is no tenderness. There is no rebound and no guarding.  Musculoskeletal: Normal range of motion. He exhibits no edema and no tenderness.  Neurological: He is alert and oriented to person, place, and time. Coordination normal.  Skin: Skin is warm and dry. No rash noted. He is not diaphoretic. No erythema. No pallor.  Psychiatric: He has a normal mood and affect. His behavior is normal. Judgment and thought content normal.  Vascular: Radial pulses normal bilaterally. Femoral pulses are slightly diminished bilaterally. Posterior tibial: Absent on the right side and faint on the left side. Dorsalis pedis: Absent on the right side and faint on the left side. He has 2 ulcerations on the medial and lateral aspect of the lower part of the shin measuring about 2 x 4 cm. There is no evidence of secondary infection.     ASSESSMENT AND PLAN   

## 2013-03-03 ENCOUNTER — Other Ambulatory Visit: Payer: Self-pay | Admitting: Cardiovascular Disease

## 2013-03-03 NOTE — Addendum Note (Signed)
Addended by: Micki Riley C on: 03/03/2013 04:59 PM   Modules accepted: Orders

## 2013-03-03 NOTE — Telephone Encounter (Signed)
Yes. That is fine.  

## 2013-03-03 NOTE — Telephone Encounter (Signed)
Verbally refilled  Tramadol 50 mg, take 2 tablets every 8 hours as needed for pain Quantity 120, 0 refills   Micki Riley, CMA

## 2013-03-14 ENCOUNTER — Encounter (INDEPENDENT_AMBULATORY_CARE_PROVIDER_SITE_OTHER): Payer: PRIVATE HEALTH INSURANCE | Admitting: Cardiology

## 2013-03-14 DIAGNOSIS — I739 Peripheral vascular disease, unspecified: Secondary | ICD-10-CM

## 2013-03-18 ENCOUNTER — Other Ambulatory Visit: Payer: Self-pay | Admitting: Cardiovascular Disease

## 2013-03-22 ENCOUNTER — Ambulatory Visit (INDEPENDENT_AMBULATORY_CARE_PROVIDER_SITE_OTHER): Payer: PRIVATE HEALTH INSURANCE | Admitting: Cardiovascular Disease

## 2013-03-22 ENCOUNTER — Encounter: Payer: Self-pay | Admitting: Cardiovascular Disease

## 2013-03-22 VITALS — BP 148/78 | HR 68 | Ht 68.0 in | Wt 150.4 lb

## 2013-03-22 DIAGNOSIS — I2510 Atherosclerotic heart disease of native coronary artery without angina pectoris: Secondary | ICD-10-CM

## 2013-03-22 DIAGNOSIS — I701 Atherosclerosis of renal artery: Secondary | ICD-10-CM

## 2013-03-22 DIAGNOSIS — F17200 Nicotine dependence, unspecified, uncomplicated: Secondary | ICD-10-CM

## 2013-03-22 DIAGNOSIS — Z720 Tobacco use: Secondary | ICD-10-CM

## 2013-03-22 DIAGNOSIS — I10 Essential (primary) hypertension: Secondary | ICD-10-CM

## 2013-03-22 DIAGNOSIS — I739 Peripheral vascular disease, unspecified: Secondary | ICD-10-CM

## 2013-03-22 NOTE — Patient Instructions (Addendum)
Schedule Renal Artery Duplex Dopplers    Your physician recommends that you schedule a follow-up appointment in: 2 months

## 2013-03-27 ENCOUNTER — Encounter: Payer: Self-pay | Admitting: Cardiovascular Disease

## 2013-03-27 DIAGNOSIS — Z720 Tobacco use: Secondary | ICD-10-CM | POA: Insufficient documentation

## 2013-03-27 DIAGNOSIS — I701 Atherosclerosis of renal artery: Secondary | ICD-10-CM | POA: Insufficient documentation

## 2013-03-27 NOTE — Assessment & Plan Note (Signed)
He has no symptoms of angina. Continue medical therapy. 

## 2013-03-27 NOTE — Progress Notes (Signed)
PCP : Nicki Reaper, NP  HPI  This is a 57 year old man who is here today for followup visit regarding peripheral arterial disease with nonhealing ulcer on the right leg. The patient has known history of coronary artery disease with previous drug-eluting stent placement to the proximal left circumflex in 2008. He also had previous stent placement to the right SFA in 2003. He has known history of hypertension, hyperlipidemia and prolonged tobacco use. He is not diabetic. He was seen recently for ulceration on the medial and lateral aspect of the right shin which worsened over a few months with significant chest pain. His ABI was 0.66 on the right side with evidence of SFA disease. I proceeded with angiography which showed severe left renal artery stenosis, moderate right iliac artery stenosis and severe diffuse disease in the right SFA with two-vessel runoff below the knee bilaterally. I performed directional atherectomy of the right SFA with excellent results. Post procedure ABI improved to 0.9. He is currently attending at the wound clinic with gradual improvement.  Allergies  Allergen Reactions  . Codeine Nausea Only     Current Outpatient Prescriptions on File Prior to Visit  Medication Sig Dispense Refill  . ALPRAZolam (XANAX) 0.5 MG tablet TAKE 1 TABLET BY MOUTH EVERY DAY AT BEDTIME AS NEEDED FOR SLEEP  30 tablet  0  . amLODipine (NORVASC) 10 MG tablet TAKE 1 TABLET BY MOUTH EVERY DAY  30 tablet  1  . aspirin EC 81 MG tablet Take 1 tablet (81 mg total) by mouth daily.  1 tablet  0  . clopidogrel (PLAVIX) 75 MG tablet Take 1 tablet (75 mg total) by mouth daily.  30 tablet  11  . furosemide (LASIX) 40 MG tablet TAKE 1 TABLET BY MOUTH TWICE A DAY  60 tablet  2  . ibuprofen (ADVIL,MOTRIN) 200 MG tablet Take 600 mg by mouth every 6 (six) hours as needed for pain.      Marland Kitchen lisinopril (PRINIVIL,ZESTRIL) 10 MG tablet Take 10 mg by mouth 2 (two) times daily.      . metoprolol tartrate (LOPRESSOR)  25 MG tablet TAKE 1 TABLET BY MOUTH TWICE A DAY  180 tablet  1  . mirtazapine (REMERON SOL-TAB) 15 MG disintegrating tablet TAKE 1 TABLET BY MOUTH EVERY DAY AT BEDTIME  30 tablet  0  . traMADol (ULTRAM) 50 MG tablet Take 2 tablets (100 mg total) by mouth every 8 (eight) hours as needed for pain.  120 tablet  0   No current facility-administered medications on file prior to visit.     Past Medical History  Diagnosis Date  . Hypertension   . COPD 11/27/2007    Qualifier: Diagnosis of  By: Cheri Guppy    . ERECTILE DYSFUNCTION 11/27/2007    Qualifier: Diagnosis of  By: Cheri Guppy    . HYPERLIPIDEMIA 11/27/2007    Qualifier: Diagnosis of  By: Cheri Guppy    . HYPERTENSION, BENIGN 05/23/2009    Qualifier: Diagnosis of  By: Sherene Sires MD, Charlaine Dalton   . Asthma   . Coronary artery disease     Cardiac catheterization in 2008 showed 99% stenosis in proximal left circumflex which was treated with Taxus drug-eluting stent. There was mild RCA and LAD disease with normal ejection fraction.  Marland Kitchen PAD (peripheral artery disease)     Previous right SFA stent in 2003. Directional atherectomy right SFA in 01/2013  . Tobacco use      Past Surgical History  Procedure Laterality  Date  . Nasal sinus surgery  2004  . Acne cyst removal    . Other surgical history      blocked artery     Family History  Problem Relation Age of Onset  . Adopted: Yes  . Heart disease Maternal Grandfather      History   Social History  . Marital Status: Single    Spouse Name: N/A    Number of Children: N/A  . Years of Education: 12   Occupational History  . Cutter Operator    Social History Main Topics  . Smoking status: Current Every Day Smoker -- 1.50 packs/day for 40 years  . Smokeless tobacco: Never Used  . Alcohol Use: 2.4 oz/week    4 Cans of beer per week  . Drug Use: No  . Sexual Activity: Not Currently   Other Topics Concern  . Not on file   Social History Narrative   Regular  exercise-no   Caffeine Use-yes     ROS A 10 point review of system was performed. It's negative other than what is mentioned in the history of present illness.  PHYSICAL EXAM   BP 148/78  Pulse 68  Ht 5\' 8"  (1.727 m)  Wt 150 lb 6.4 oz (68.221 kg)  BMI 22.87 kg/m2  SpO2 94% Constitutional: He is oriented to person, place, and time. He appears well-developed and well-nourished. No distress.  HENT: No nasal discharge.  Head: Normocephalic and atraumatic.  Eyes: Pupils are equal and round. Right eye exhibits no discharge. Left eye exhibits no discharge.  Neck: Normal range of motion. Neck supple. No JVD present. No thyromegaly present. There is a left carotid bruit. Cardiovascular: Normal rate, regular rhythm, normal heart sounds and. Exam reveals no gallop and no friction rub. No murmur heard.  Pulmonary/Chest: Effort normal and breath sounds normal. No stridor. No respiratory distress. He has no wheezes. He has no rales. He exhibits no tenderness.  Abdominal: Soft. Bowel sounds are normal. He exhibits no distension. There is no tenderness. There is no rebound and no guarding.  Musculoskeletal: Normal range of motion. He exhibits no edema and no tenderness.  Neurological: He is alert and oriented to person, place, and time. Coordination normal.  Skin: Skin is warm and dry. No rash noted. He is not diaphoretic. No erythema. No pallor.  Psychiatric: He has a normal mood and affect. His behavior is normal. Judgment and thought content normal.  Vascular: Radial pulses normal bilaterally. Femoral pulses are slightly diminished bilaterally. No hematoma in the left groin. Posterior tibial: Absent on the right side and faint on the left side. Dorsalis pedis: Absent on the right side and faint on the left side.     ASSESSMENT AND PLAN

## 2013-03-27 NOTE — Assessment & Plan Note (Signed)
Recent angiography showed a 95%  proximal left renal artery stenosis. He currently does have uncontrolled hypertension in spite  of being on 4 different medications including a diuretic. Thus, there is an indication for renal artery stenting. I will obtain a renal artery duplex to measure velocity as well as left renal size to make sure that the kidney is not too small.

## 2013-03-27 NOTE — Assessment & Plan Note (Signed)
I again had a prolonged discussion with him about the importance of smoking cessation especially with the presence of diffuse atherosclerosis. There is definitely increased risk of restenosis in smokers.  He does not think he can quit at this point.

## 2013-03-27 NOTE — Assessment & Plan Note (Signed)
He is doing better after recent atherectomy of the right SFA. The pain improved. The wounds seem to be healing better. Postprocedure ABI improved to 0.9. Continue dual antiplatelet therapy.  Lower extremity duplex and ABI is recommended in 6 months.

## 2013-03-28 ENCOUNTER — Other Ambulatory Visit: Payer: Self-pay | Admitting: Internal Medicine

## 2013-03-29 DIAGNOSIS — R0989 Other specified symptoms and signs involving the circulatory and respiratory systems: Secondary | ICD-10-CM

## 2013-03-29 NOTE — Telephone Encounter (Signed)
RX faxed to pharmacy.

## 2013-03-30 ENCOUNTER — Encounter (HOSPITAL_BASED_OUTPATIENT_CLINIC_OR_DEPARTMENT_OTHER): Payer: PRIVATE HEALTH INSURANCE | Attending: General Surgery

## 2013-03-30 DIAGNOSIS — L97809 Non-pressure chronic ulcer of other part of unspecified lower leg with unspecified severity: Secondary | ICD-10-CM | POA: Insufficient documentation

## 2013-03-30 DIAGNOSIS — I739 Peripheral vascular disease, unspecified: Secondary | ICD-10-CM | POA: Insufficient documentation

## 2013-04-21 ENCOUNTER — Encounter (INDEPENDENT_AMBULATORY_CARE_PROVIDER_SITE_OTHER): Payer: PRIVATE HEALTH INSURANCE

## 2013-04-21 DIAGNOSIS — I701 Atherosclerosis of renal artery: Secondary | ICD-10-CM

## 2013-04-21 DIAGNOSIS — I714 Abdominal aortic aneurysm, without rupture: Secondary | ICD-10-CM

## 2013-04-21 DIAGNOSIS — I10 Essential (primary) hypertension: Secondary | ICD-10-CM

## 2013-04-22 NOTE — Progress Notes (Signed)
Patient ID: Edwin [name], male   DOB: 03/01/1956, 57 y.o.   MRN: [id number] Erroneous encounter This encounter was created in error - please disregard.

## 2013-04-25 ENCOUNTER — Other Ambulatory Visit: Payer: Self-pay | Admitting: Internal Medicine

## 2013-04-26 NOTE — Telephone Encounter (Signed)
Called rx into pharmacy.

## 2013-04-26 NOTE — Telephone Encounter (Signed)
Can you please call this RX in?

## 2013-04-29 ENCOUNTER — Encounter (HOSPITAL_BASED_OUTPATIENT_CLINIC_OR_DEPARTMENT_OTHER): Payer: PRIVATE HEALTH INSURANCE | Attending: General Surgery

## 2013-04-29 DIAGNOSIS — G5790 Unspecified mononeuropathy of unspecified lower limb: Secondary | ICD-10-CM | POA: Insufficient documentation

## 2013-04-29 DIAGNOSIS — I739 Peripheral vascular disease, unspecified: Secondary | ICD-10-CM | POA: Insufficient documentation

## 2013-04-29 DIAGNOSIS — L97809 Non-pressure chronic ulcer of other part of unspecified lower leg with unspecified severity: Secondary | ICD-10-CM | POA: Insufficient documentation

## 2013-05-12 ENCOUNTER — Encounter (HOSPITAL_COMMUNITY): Payer: PRIVATE HEALTH INSURANCE

## 2013-05-16 ENCOUNTER — Other Ambulatory Visit: Payer: Self-pay

## 2013-05-16 DIAGNOSIS — I739 Peripheral vascular disease, unspecified: Secondary | ICD-10-CM

## 2013-05-16 MED ORDER — CLOPIDOGREL BISULFATE 75 MG PO TABS: 75.0000 mg | ORAL_TABLET | Freq: Every day | ORAL | Status: DC

## 2013-05-16 NOTE — Telephone Encounter (Signed)
Refill sent for plavix  

## 2013-05-24 ENCOUNTER — Ambulatory Visit: Payer: PRIVATE HEALTH INSURANCE | Admitting: Cardiovascular Disease

## 2013-05-26 ENCOUNTER — Other Ambulatory Visit: Payer: Self-pay | Admitting: Internal Medicine

## 2013-05-27 NOTE — Telephone Encounter (Signed)
Called in xanax to [facility] pharmacy...ds,cma

## 2013-05-27 NOTE — Telephone Encounter (Signed)
Phone in please.

## 2013-05-30 ENCOUNTER — Encounter (HOSPITAL_COMMUNITY): Payer: PRIVATE HEALTH INSURANCE

## 2013-05-30 ENCOUNTER — Encounter (HOSPITAL_COMMUNITY): Payer: Self-pay | Admitting: Cardiovascular Disease

## 2013-05-30 DIAGNOSIS — R0989 Other specified symptoms and signs involving the circulatory and respiratory systems: Secondary | ICD-10-CM

## 2013-06-01 ENCOUNTER — Encounter (HOSPITAL_BASED_OUTPATIENT_CLINIC_OR_DEPARTMENT_OTHER): Payer: PRIVATE HEALTH INSURANCE | Attending: General Surgery

## 2013-06-01 DIAGNOSIS — L97809 Non-pressure chronic ulcer of other part of unspecified lower leg with unspecified severity: Secondary | ICD-10-CM | POA: Insufficient documentation

## 2013-06-01 DIAGNOSIS — I739 Peripheral vascular disease, unspecified: Secondary | ICD-10-CM | POA: Insufficient documentation

## 2013-06-01 DIAGNOSIS — I998 Other disorder of circulatory system: Secondary | ICD-10-CM | POA: Insufficient documentation

## 2013-06-14 ENCOUNTER — Ambulatory Visit: Payer: PRIVATE HEALTH INSURANCE | Admitting: Cardiovascular Disease

## 2013-06-21 ENCOUNTER — Ambulatory Visit: Payer: PRIVATE HEALTH INSURANCE | Admitting: Cardiovascular Disease

## 2013-06-21 ENCOUNTER — Other Ambulatory Visit: Payer: Self-pay | Admitting: Internal Medicine

## 2013-06-21 ENCOUNTER — Other Ambulatory Visit: Payer: Self-pay | Admitting: Cardiovascular Disease

## 2013-06-22 NOTE — Telephone Encounter (Signed)
Phoned in to pharmacy. 

## 2013-06-22 NOTE — Telephone Encounter (Signed)
Ok to phone in.

## 2013-07-01 ENCOUNTER — Encounter (HOSPITAL_BASED_OUTPATIENT_CLINIC_OR_DEPARTMENT_OTHER): Payer: PRIVATE HEALTH INSURANCE | Attending: General Surgery

## 2013-07-01 ENCOUNTER — Encounter: Payer: Self-pay | Admitting: Cardiovascular Disease

## 2013-07-01 DIAGNOSIS — I739 Peripheral vascular disease, unspecified: Secondary | ICD-10-CM | POA: Insufficient documentation

## 2013-07-01 DIAGNOSIS — L97809 Non-pressure chronic ulcer of other part of unspecified lower leg with unspecified severity: Secondary | ICD-10-CM | POA: Insufficient documentation

## 2013-07-01 DIAGNOSIS — L97209 Non-pressure chronic ulcer of unspecified calf with unspecified severity: Secondary | ICD-10-CM | POA: Insufficient documentation

## 2013-07-05 ENCOUNTER — Other Ambulatory Visit: Payer: Self-pay | Admitting: *Deleted

## 2013-07-05 ENCOUNTER — Other Ambulatory Visit: Payer: Self-pay | Admitting: Cardiovascular Disease

## 2013-07-05 MED ORDER — FUROSEMIDE 40 MG PO TABS: ORAL_TABLET | ORAL | Status: DC

## 2013-07-05 NOTE — Telephone Encounter (Signed)
Requested Prescriptions   Signed Prescriptions Disp Refills  . furosemide (LASIX) 40 MG tablet 60 tablet 0    Sig: TAKE 1 TABLET BY MOUTH TWICE A DAY    Authorizing Provider: Lorine Bears A    Ordering User: Kendrick Fries

## 2013-07-26 ENCOUNTER — Other Ambulatory Visit: Payer: Self-pay | Admitting: Internal Medicine

## 2013-07-26 NOTE — Telephone Encounter (Signed)
Last filled 06/24/13--please advise 

## 2013-07-26 NOTE — Telephone Encounter (Signed)
Ok to phone in xanax 

## 2013-07-26 NOTE — Telephone Encounter (Signed)
Rx called in to pharmacy. 

## 2013-07-31 ENCOUNTER — Emergency Department (HOSPITAL_COMMUNITY): Payer: PRIVATE HEALTH INSURANCE

## 2013-07-31 ENCOUNTER — Encounter (HOSPITAL_COMMUNITY): Payer: Self-pay | Admitting: Emergency Medicine

## 2013-07-31 ENCOUNTER — Emergency Department (HOSPITAL_COMMUNITY)
Admission: EM | Admit: 2013-07-31 | Discharge: 2013-07-31 | Disposition: A | Discharge status: Home / Self Care | Payer: PRIVATE HEALTH INSURANCE | Attending: Emergency Medicine | Admitting: Emergency Medicine

## 2013-07-31 DIAGNOSIS — I2510 Atherosclerotic heart disease of native coronary artery without angina pectoris: Secondary | ICD-10-CM | POA: Insufficient documentation

## 2013-07-31 DIAGNOSIS — G5630 Lesion of radial nerve, unspecified upper limb: Secondary | ICD-10-CM | POA: Insufficient documentation

## 2013-07-31 DIAGNOSIS — Z87448 Personal history of other diseases of urinary system: Secondary | ICD-10-CM | POA: Insufficient documentation

## 2013-07-31 DIAGNOSIS — Z79899 Other long term (current) drug therapy: Secondary | ICD-10-CM | POA: Insufficient documentation

## 2013-07-31 DIAGNOSIS — Z7982 Long term (current) use of aspirin: Secondary | ICD-10-CM | POA: Insufficient documentation

## 2013-07-31 DIAGNOSIS — Y939 Activity, unspecified: Secondary | ICD-10-CM | POA: Insufficient documentation

## 2013-07-31 DIAGNOSIS — Z8639 Personal history of other endocrine, nutritional and metabolic disease: Secondary | ICD-10-CM | POA: Insufficient documentation

## 2013-07-31 DIAGNOSIS — X58XXXA Exposure to other specified factors, initial encounter: Secondary | ICD-10-CM | POA: Insufficient documentation

## 2013-07-31 DIAGNOSIS — F17200 Nicotine dependence, unspecified, uncomplicated: Secondary | ICD-10-CM | POA: Insufficient documentation

## 2013-07-31 DIAGNOSIS — I10 Essential (primary) hypertension: Secondary | ICD-10-CM | POA: Insufficient documentation

## 2013-07-31 DIAGNOSIS — J449 Chronic obstructive pulmonary disease, unspecified: Secondary | ICD-10-CM | POA: Insufficient documentation

## 2013-07-31 DIAGNOSIS — Z862 Personal history of diseases of the blood and blood-forming organs and certain disorders involving the immune mechanism: Secondary | ICD-10-CM | POA: Insufficient documentation

## 2013-07-31 DIAGNOSIS — Z7902 Long term (current) use of antithrombotics/antiplatelets: Secondary | ICD-10-CM | POA: Insufficient documentation

## 2013-07-31 DIAGNOSIS — J4489 Other specified chronic obstructive pulmonary disease: Secondary | ICD-10-CM | POA: Insufficient documentation

## 2013-07-31 DIAGNOSIS — G5632 Lesion of radial nerve, left upper limb: Secondary | ICD-10-CM

## 2013-07-31 DIAGNOSIS — Y929 Unspecified place or not applicable: Secondary | ICD-10-CM | POA: Insufficient documentation

## 2013-07-31 MED ORDER — TRAMADOL HCL 50 MG PO TABS: 100.0000 mg | ORAL_TABLET | Freq: Three times a day (TID) | ORAL | Status: DC | PRN

## 2013-07-31 NOTE — Discharge Instructions (Signed)
Radial Nerve Palsy Wrist drop is also known as radial nerve palsy. It is a condition in which you can not extend your wrist. This means if you are standing with your elbow bent at a right angle and with the top of your hand pointed at the ceiling, you can not hold your hand up. It falls toward the floor.  This action of extending your wrist is caused by the muscles in the back of your arm. These muscles are controlled by the radial nerve. This means that anything affecting the radial nerve so it can not tell the muscles how to work will cause wrist drop. This is medically called radial nerve palsy. Also the radial nerve is a motor and sensory nerve so anything affecting it causes problems with movement and feeling. CAUSES  Some more common causes of wrist drop are:  A break (fracture) of the large bone in the arm between your shoulder and your elbow (humerus). This is because the radial nerve winds around the humerus.  Improper use of crutches causes this because the radial nerve runs through the armpit (axilla). Crutches which are too long can put pressure on the nerve. This is sometimes called crutch palsy.  Falling asleep with your arm over a chair and supported on the back is a common cause. This is sometimes called Saturday Night Syndrome.  Wrist drop can be associated with lead poisoning because of the effect of lead on the radial nerve. SYMPTOMS  The wrist drop is an obvious problem, but there may also be numbness in the back of the arm, forearm or hand which provides feeling in these areas by the radial nerve. There can be difficulty straightening out the elbow in addition to the wrist. There may be numbness, tingling, pain, burning sensations or other abnormal feelings. Symptoms depend entirely on where the radial nerve is injured. DIAGNOSIS   Wrist drop is obvious just by looking at it. Your caregiver may make the diagnosis by taking your history and doing a couple tests.  One test which  may be done is a nerve conduction study. This test shows if the radial nerve is conducting signals well. If not, it can determine where the nerve problem is.  Sometimes X-ray studies are done. Your caregiver will determine if further testing needs to be done. TREATMENT   Usually if the problem is found to be pressure on the nerve, simply removing the pressure will allow the nerve to go back to normal in a few weeks to a few months. Other treatments will depend upon the cause found.  Only take over-the-counter or prescription medicines for pain, discomfort, or fever as directed by your caregiver.  Sometimes seizure medications are used.  Steroids are sometimes given to decrease swelling if it is thought to be a possible cause. Document Released: 03/20/2006 Document Revised: 10/06/2011 Document Reviewed: 04/30/2006 [facility] Patient Information 2014 [place], [place].

## 2013-07-31 NOTE — ED Notes (Addendum)
Pt states he fell asleep against chair with high arm on Christmas Eve, which bruised L upper arm. Upon awakening pt was unable to fan fingers out completely or raise wrist up and down. Pt states this has not improved or gotten worse. Pt's L hand slightly swollen.

## 2013-07-31 NOTE — ED Notes (Signed)
Splint applied to left wrist.

## 2013-07-31 NOTE — ED Provider Notes (Signed)
CSN: [id number]     Arrival date & time 07/31/13  1623 History  This chart was scribed for Delos Haring, PA-C, working with Tanna Furry, MD, by Elby Beck ED Scribe. This patient was seen in room [place] and the patient's care was started at 6:00 PM.   Chief Complaint  Patient presents with  . Arm Injury    The history is provided by the patient. No language interpreter was used.    HPI Comments: Edwin [name] is a 58 y.o. male with a history of COPD (who continue to smoke) and CAD who presents to the Emergency Department complaining of an arm injury that occurred 10 days ago, after pt reports falling asleep in a chair, with his weight on his arm. He states that he has noticed an associated bruise form over his left arm. He expresses concern that he has had reduced ROM in his left arm since the injury. He denies any other pain or symptoms.   Past Medical History  Diagnosis Date  . Hypertension   . COPD 11/27/2007    Qualifier: Diagnosis of  By: Garen Grams    . ERECTILE DYSFUNCTION 11/27/2007    Qualifier: Diagnosis of  By: Garen Grams    . HYPERLIPIDEMIA 11/27/2007    Qualifier: Diagnosis of  By: Garen Grams    . HYPERTENSION, BENIGN 05/23/2009    Qualifier: Diagnosis of  By: Melvyn Novas MD, Christena Deem   . Asthma   . Coronary artery disease     Cardiac catheterization in 2008 showed 99% stenosis in proximal left circumflex which was treated with Taxus drug-eluting stent. There was mild RCA and LAD disease with normal ejection fraction.  Marland Kitchen PAD (peripheral artery disease)     Previous right SFA stent in 2003. Directional atherectomy right SFA in 01/2013  . Tobacco use    Past Surgical History  Procedure Laterality Date  . Nasal sinus surgery  2004  . Acne cyst removal    . Other surgical history      blocked artery   Family History  Problem Relation Age of Onset  . Adopted: Yes  . Heart disease Maternal Grandfather    History  Substance Use Topics  . Smoking  status: Current Every Day Smoker -- 1.50 packs/day for 40 years  . Smokeless tobacco: Never Used  . Alcohol Use: 2.4 oz/week    4 Cans of beer per week    Review of Systems  Musculoskeletal:       Left arm bruising and reduced ROM  All other systems reviewed and are negative.   Allergies  Codeine  Home Medications   Current Outpatient Rx  Name  Route  Sig  Dispense  Refill  . ALPRAZolam (XANAX) 0.5 MG tablet      TAKE 1 TABLET BY MOUTH EVERY NIGHT AT BEDTIME AS NEEDED FOR SLEEP   30 tablet   0   . amLODipine (NORVASC) 10 MG tablet      TAKE 1 TABLET BY MOUTH EVERY DAY   30 tablet   1     PLEASE ISSUE A 90 DAY RX PER INSURANCE. THANK YOU.   Marland Kitchen aspirin EC 81 MG tablet   Oral   Take 1 tablet (81 mg total) by mouth daily.   1 tablet   0   . clopidogrel (PLAVIX) 75 MG tablet   Oral   Take 1 tablet (75 mg total) by mouth daily.   90 tablet   3   . furosemide (  LASIX) 40 MG tablet      TAKE 1 TABLET BY MOUTH TWICE A DAY   60 tablet   2   . furosemide (LASIX) 40 MG tablet      TAKE 1 TABLET BY MOUTH TWICE A DAY   60 tablet   0     Patient needs to contact office to schedule future ...   . HYDROcodone-acetaminophen (NORCO/VICODIN) 5-325 MG per tablet   Oral   Take 1 tablet by mouth every 6 (six) hours as needed.          Marland Kitchen ibuprofen (ADVIL,MOTRIN) 200 MG tablet   Oral   Take 600 mg by mouth every 6 (six) hours as needed for pain.         Marland Kitchen lisinopril (PRINIVIL,ZESTRIL) 10 MG tablet      TAKE 2 TABLETS BY MOUTH DAILY.   180 tablet   0   . metoprolol tartrate (LOPRESSOR) 25 MG tablet      TAKE 1 TABLET BY MOUTH TWICE A DAY   180 tablet   1     PLEASE SUBMIT A 90 DAY RX TO [facility] PER INSURANCE REQ ...   . mirtazapine (REMERON SOL-TAB) 15 MG disintegrating tablet      TAKE 1 TABLET BY MOUTH EVERY DAY AT BEDTIME   30 tablet   0   . traMADol (ULTRAM) 50 MG tablet   Oral   Take 2 tablets (100 mg total) by mouth every 8 (eight) hours as needed.    120 tablet   0    Triage Vitals: BP 158/92  Pulse 96  Temp(Src) 98.7 F (37.1 C) (Oral)  Resp 16  SpO2 90%  Physical Exam  Nursing note and vitals reviewed. Constitutional: He is oriented to person, place, and time. He appears well-developed and well-nourished. No distress.  HENT:  Head: Normocephalic and atraumatic.  Eyes: EOM are normal.  Neck: Neck supple. No tracheal deviation present.  Cardiovascular: Normal rate.   Pulmonary/Chest: Effort normal. No respiratory distress.  Musculoskeletal: Normal range of motion.  Palsy of left radial nerve. Strength is symmetrical to left elbow. Loss of flexion and extension of left wrist. Decreased ROM of fingers with small amount of movement to all 5 fingers.  Strong radial pulse Intact sensation.  Neurological: He is alert and oriented to person, place, and time.  Skin: Skin is warm and dry.  Psychiatric: He has a normal mood and affect. His behavior is normal.    ED Course  Procedures (including critical care time)  DIAGNOSTIC STUDIES: Oxygen Saturation is 90% on RA, normal by my interpretation.    COORDINATION OF CARE: 6:04 PM- Discussed radiology findings, and plan to order pt a Velcro splint. Pt is requesting a work note for light duty. Pt advised of plan for treatment and pt agrees.  Labs Review  Labs Reviewed - No data to display Imaging Review Dg Wrist Complete Left  07/31/2013   CLINICAL DATA:  Pain with limited range of motion. No trauma history submitted.  EXAM: LEFT WRIST - COMPLETE 3+ VIEW  COMPARISON:  DG HAND COMPLETE*L* dated 07/31/2013  FINDINGS: Subchondral cyst formation within the base the thumb and radial aspect of the carpal bones. No acute fracture or dislocation. Ulnar styloid intact. Scaphoid intact.  IMPRESSION: Osteoarthritis, without acute superimposed process.   Electronically Signed   By: Abigail Miyamoto M.D.   On: 07/31/2013 17:55   Dg Hand Complete Left  07/31/2013   CLINICAL DATA:  Pain.  Limited  range of motion.  No injury.  EXAM: LEFT HAND - COMPLETE 3+ VIEW  COMPARISON:  DG WRIST COMPLETE*L* dated 07/31/2013  FINDINGS: Moderate degenerative changes, including subchondral cyst formation at the base of the thumb and within the radial aspect of the carpals. 2nd 3rd metacarpal phalangeal osteoarthritis also identified. No acute fracture or dislocation. Mild overlap of fingers on the lateral view. Suspect mild soft tissue swelling at the level of the metacarpophalangeal joints.  IMPRESSION: Moderate osteoarthritis, without acute superimposed process.   Electronically Signed   By: Abigail Miyamoto M.D.   On: 07/31/2013 17:55    EKG Interpretation   None       MDM   1. Saturday night nerve palsy, left    Palsy lasting longer than normal, I recommended he follow-up with the specialist but he said he would be unable to. Will put in velcro wrist splint for support, pt to wait another week and see specialist or return to be seen again if he is not improving.  58 y.o.Talal Fritchman Puerta's evaluation in the Emergency Department is complete. It has been determined that no acute conditions requiring further emergency intervention are present at this time. The patient/guardian have been advised of the diagnosis and plan. We have discussed signs and symptoms that warrant return to the ED, such as changes or worsening in symptoms.  Vital signs are stable at discharge. Filed Vitals:   07/31/13 1637  BP: 158/92  Pulse: 96  Temp: 98.7 F (37.1 C)  Resp: 16    Patient/guardian has voiced understanding and agreed to follow-up with the PCP or specialist.  I personally performed the services described in this documentation, which was scribed in my presence. The recorded information has been reviewed and is accurate.   Linus Mako, PA-C 07/31/13 Vernelle Emerald

## 2013-08-04 ENCOUNTER — Other Ambulatory Visit: Payer: Self-pay | Admitting: Internal Medicine

## 2013-08-08 ENCOUNTER — Emergency Department (HOSPITAL_COMMUNITY)
Admission: EM | Admit: 2013-08-08 | Discharge: 2013-08-08 | Disposition: A | Discharge status: Home / Self Care | Payer: PRIVATE HEALTH INSURANCE | Attending: Emergency Medicine | Admitting: Emergency Medicine

## 2013-08-08 ENCOUNTER — Encounter (HOSPITAL_BASED_OUTPATIENT_CLINIC_OR_DEPARTMENT_OTHER): Payer: PRIVATE HEALTH INSURANCE | Attending: Plastic Surgery

## 2013-08-08 ENCOUNTER — Encounter (HOSPITAL_COMMUNITY): Payer: Self-pay | Admitting: Emergency Medicine

## 2013-08-08 DIAGNOSIS — I2510 Atherosclerotic heart disease of native coronary artery without angina pectoris: Secondary | ICD-10-CM | POA: Insufficient documentation

## 2013-08-08 DIAGNOSIS — Z7982 Long term (current) use of aspirin: Secondary | ICD-10-CM | POA: Diagnosis not present

## 2013-08-08 DIAGNOSIS — Z7902 Long term (current) use of antithrombotics/antiplatelets: Secondary | ICD-10-CM | POA: Insufficient documentation

## 2013-08-08 DIAGNOSIS — Z79899 Other long term (current) drug therapy: Secondary | ICD-10-CM | POA: Insufficient documentation

## 2013-08-08 DIAGNOSIS — Z862 Personal history of diseases of the blood and blood-forming organs and certain disorders involving the immune mechanism: Secondary | ICD-10-CM | POA: Insufficient documentation

## 2013-08-08 DIAGNOSIS — Z9861 Coronary angioplasty status: Secondary | ICD-10-CM | POA: Diagnosis not present

## 2013-08-08 DIAGNOSIS — Z87448 Personal history of other diseases of urinary system: Secondary | ICD-10-CM | POA: Insufficient documentation

## 2013-08-08 DIAGNOSIS — G5630 Lesion of radial nerve, unspecified upper limb: Secondary | ICD-10-CM | POA: Insufficient documentation

## 2013-08-08 DIAGNOSIS — F17200 Nicotine dependence, unspecified, uncomplicated: Secondary | ICD-10-CM | POA: Diagnosis not present

## 2013-08-08 DIAGNOSIS — I10 Essential (primary) hypertension: Secondary | ICD-10-CM | POA: Insufficient documentation

## 2013-08-08 DIAGNOSIS — Z8639 Personal history of other endocrine, nutritional and metabolic disease: Secondary | ICD-10-CM | POA: Insufficient documentation

## 2013-08-08 DIAGNOSIS — J449 Chronic obstructive pulmonary disease, unspecified: Secondary | ICD-10-CM | POA: Diagnosis not present

## 2013-08-08 DIAGNOSIS — G5632 Lesion of radial nerve, left upper limb: Secondary | ICD-10-CM

## 2013-08-08 DIAGNOSIS — M25549 Pain in joints of unspecified hand: Secondary | ICD-10-CM | POA: Diagnosis present

## 2013-08-08 DIAGNOSIS — J4489 Other specified chronic obstructive pulmonary disease: Secondary | ICD-10-CM | POA: Insufficient documentation

## 2013-08-08 NOTE — ED Notes (Signed)
Pt was seen recently for same; says he is not able to fully raise his fingertips up.

## 2013-08-08 NOTE — ED Provider Notes (Signed)
CSN: [id number]     Arrival date & time 08/08/13  1750 History  This chart was scribed for non-physician practitioner Junious Silk, working with Ward Givens, MD by Carl Best, ED Scribe. This patient was seen in room WTR8/[facility] and the patient's care was started at 6:36 PM.    Chief Complaint  Patient presents with  . Hand Pain    Patient is a 58 y.o. male presenting with hand pain. The history is provided by the patient. No language interpreter was used.  Hand Pain   HPI Comments: Edwin [name] is a 58 y.o. male who presents to the Emergency Department complaining of constant left arm pain that started a week ago.  He states that he is having trouble extending the fingers on his left hand.  The patient states that he slept on his left arm wrong on Christmas night and the pain started that same day.  He states that he was seen for the same symptoms a week ago and was told to return to the ED if his symptoms do not improve.  He states that he was given a referral to a hand specialist at his last visit to the ED but states he did not follow-up with one because he thought the specialist was located in [place].   Past Medical History  Diagnosis Date  . Hypertension   . COPD 11/27/2007    Qualifier: Diagnosis of  By: Cheri Guppy    . ERECTILE DYSFUNCTION 11/27/2007    Qualifier: Diagnosis of  By: Cheri Guppy    . HYPERLIPIDEMIA 11/27/2007    Qualifier: Diagnosis of  By: Cheri Guppy    . HYPERTENSION, BENIGN 05/23/2009    Qualifier: Diagnosis of  By: Sherene Sires MD, Charlaine Dalton   . Asthma   . Coronary artery disease     Cardiac catheterization in 2008 showed 99% stenosis in proximal left circumflex which was treated with Taxus drug-eluting stent. There was mild RCA and LAD disease with normal ejection fraction.  Marland Kitchen PAD (peripheral artery disease)     Previous right SFA stent in 2003. Directional atherectomy right SFA in 01/2013  . Tobacco use    Past Surgical History  Procedure  Laterality Date  . Nasal sinus surgery  2004  . Acne cyst removal    . Other surgical history      blocked artery   Family History  Problem Relation Age of Onset  . Adopted: Yes  . Heart disease Maternal Grandfather    History  Substance Use Topics  . Smoking status: Current Every Day Smoker -- 1.50 packs/day for 40 years  . Smokeless tobacco: Never Used  . Alcohol Use: 2.4 oz/week    4 Cans of beer per week    Review of Systems  Musculoskeletal: Positive for arthralgias (left hand, left fingers).  All other systems reviewed and are negative.    Allergies  Codeine  Home Medications   Current Outpatient Rx  Name  Route  Sig  Dispense  Refill  . ALPRAZolam (XANAX) 0.5 MG tablet      TAKE 1 TABLET BY MOUTH EVERY NIGHT AT BEDTIME AS NEEDED FOR SLEEP   30 tablet   0   . amLODipine (NORVASC) 10 MG tablet      TAKE 1 TABLET BY MOUTH EVERY DAY   30 tablet   1     PLEASE ISSUE A 90 DAY RX PER INSURANCE. THANK YOU.   Marland Kitchen aspirin EC 81 MG tablet  Oral   Take 1 tablet (81 mg total) by mouth daily.   1 tablet   0   . clopidogrel (PLAVIX) 75 MG tablet   Oral   Take 1 tablet (75 mg total) by mouth daily.   90 tablet   3   . furosemide (LASIX) 40 MG tablet      TAKE 1 TABLET BY MOUTH TWICE A DAY   60 tablet   2   . furosemide (LASIX) 40 MG tablet      TAKE 1 TABLET BY MOUTH TWICE A DAY   60 tablet   0     Patient needs to contact office to schedule future ...   . HYDROcodone-acetaminophen (NORCO/VICODIN) 5-325 MG per tablet   Oral   Take 1 tablet by mouth every 6 (six) hours as needed.          Marland Kitchen ibuprofen (ADVIL,MOTRIN) 200 MG tablet   Oral   Take 600 mg by mouth every 6 (six) hours as needed for pain.         Marland Kitchen lisinopril (PRINIVIL,ZESTRIL) 10 MG tablet      TAKE 2 TABLETS BY MOUTH DAILY.   180 tablet   0   . metoprolol tartrate (LOPRESSOR) 25 MG tablet      TAKE 1 TABLET BY MOUTH TWICE A DAY   180 tablet   1     PLEASE SUBMIT A 90 DAY  RX [facility] PER [facility]URANCE REQ ...   . mirtazapine (REMERON SOL-TAB) 15 MG disintegrating tablet      TAKE 1 TABLET BY MOUTH EVERY DAY AT BEDTIME   30 tablet   0   . traMADol (ULTRAM) 50 MG tablet   Oral   Take 2 tablets (100 mg total) by mouth every 8 (eight) hours as needed.   120 tablet   0    Triage Vitals: BP 151/92  Pulse 85  Temp(Src) 97.8 F (36.6 C)  Resp 18  SpO2 92%  Physical Exam  Nursing note and vitals reviewed. Constitutional: He is oriented to person, place, and time. He appears well-developed and well-nourished.  HENT:  Head: Normocephalic and atraumatic.  Right Ear: External ear normal.  Left Ear: External ear normal.  Eyes: Conjunctivae and EOM are normal. Pupils are equal, round, and reactive to light.  Neck: Normal range of motion and phonation normal. Neck supple.  Cardiovascular: Normal rate, regular rhythm, normal heart sounds, intact distal pulses and normal pulses.   Pulses:      Radial pulses are 2+ on the right side, and 2+ on the left side.  Capillary refill < 3 seconds in all fingers  Pulmonary/Chest: Effort normal and breath sounds normal. He exhibits no bony tenderness.  Abdominal: Soft. Normal appearance. There is no tenderness.  Musculoskeletal: Normal range of motion.  Less than 3 second capillary refill in all fingers.  Able to flex but not extend his left hand.  Grip strength 4/5 on left.   Compartment soft.   Neurological: He is alert and oriented to person, place, and time. No cranial nerve deficit or sensory deficit. He exhibits normal muscle tone. Coordination normal.  Neurovascularly intact. Sensation intact.   Skin: Skin is warm, dry and intact.  Psychiatric: He has a normal mood and affect. His behavior is normal. Judgment and thought content normal.    ED Course  Procedures (including critical care time)  DIAGNOSTIC STUDIES: Oxygen Saturation is 92% on room air, low by my interpretation.    COORDINATION  OF CARE: 6:41 PM-  Discussed consulting with the physician whom the patient last saw and developing a proper treatment plan for the patient.  The patient agreed to the treatment plan.  7:06 PM-Consult complete with Dr Caralyn Guile. Patient case explained and discussed. Dr. Caralyn Guile agrees to see patient in the office for further evaluation and treatment. Call ended at 7:08 PM.   Labs Review Labs Reviewed - No data to display Imaging Review No results found.  EKG Interpretation   None       MDM   1. Radial nerve palsy, left    Patient presents with radial nerve palsy on left hand. This has been ongoing and improving since Christmas. XR at that time was negative. No new injury. I discussed this case with Dr. Caralyn Guile who reports that these sx can last up to six months. He will see him in the office. He is neurovascularly intact. Compartment soft. He will continue to wear the splint. Return instructions given. Vital signs stable for discharge. Patient / Family / Caregiver informed of clinical course, understand medical decision-making process, and agree with plan.   I personally performed the services described in this documentation, which was scribed in my presence. The recorded information has been reviewed and is accurate.     Elwyn Lade, PA-C 08/08/13 2024

## 2013-08-08 NOTE — Discharge Instructions (Signed)
Radial Nerve Palsy Wrist drop is also known as radial nerve palsy. It is a condition in which you can not extend your wrist. This means if you are standing with your elbow bent at a right angle and with the top of your hand pointed at the ceiling, you can not hold your hand up. It falls toward the floor.  This action of extending your wrist is caused by the muscles in the back of your arm. These muscles are controlled by the radial nerve. This means that anything affecting the radial nerve so it can not tell the muscles how to work will cause wrist drop. This is medically called radial nerve palsy. Also the radial nerve is a motor and sensory nerve so anything affecting it causes problems with movement and feeling. CAUSES  Some more common causes of wrist drop are:  A break (fracture) of the large bone in the arm between your shoulder and your elbow (humerus). This is because the radial nerve winds around the humerus.  Improper use of crutches causes this because the radial nerve runs through the armpit (axilla). Crutches which are too long can put pressure on the nerve. This is sometimes called crutch palsy.  Falling asleep with your arm over a chair and supported on the back is a common cause. This is sometimes called Saturday Night Syndrome.  Wrist drop can be associated with lead poisoning because of the effect of lead on the radial nerve. SYMPTOMS  The wrist drop is an obvious problem, but there may also be numbness in the back of the arm, forearm or hand which provides feeling in these areas by the radial nerve. There can be difficulty straightening out the elbow in addition to the wrist. There may be numbness, tingling, pain, burning sensations or other abnormal feelings. Symptoms depend entirely on where the radial nerve is injured. DIAGNOSIS   Wrist drop is obvious just by looking at it. Your caregiver may make the diagnosis by taking your history and doing a couple tests.  One test which  may be done is a nerve conduction study. This test shows if the radial nerve is conducting signals well. If not, it can determine where the nerve problem is.  Sometimes X-ray studies are done. Your caregiver will determine if further testing needs to be done. TREATMENT   Usually if the problem is found to be pressure on the nerve, simply removing the pressure will allow the nerve to go back to normal in a few weeks to a few months. Other treatments will depend upon the cause found.  Only take over-the-counter or prescription medicines for pain, discomfort, or fever as directed by your caregiver.  Sometimes seizure medications are used.  Steroids are sometimes given to decrease swelling if it is thought to be a possible cause. Document Released: 03/20/2006 Document Revised: 10/06/2011 Document Reviewed: 04/30/2006 [facility] Patient Information 2014 [place], [place].

## 2013-08-08 NOTE — ED Notes (Signed)
Pt sts left hand pain, sts was seen for same week ago, and was told to come back if he doesn't feel better.

## 2013-08-09 NOTE — ED Provider Notes (Signed)
Medical screening examination/treatment/procedure(s) were performed by non-physician practitioner and as supervising physician I was immediately available for consultation/collaboration.  EKG Interpretation   None       Rolland Porter, MD, Abram Sander   Janice Norrie, MD 08/09/13 [phone]

## 2013-08-10 NOTE — ED Provider Notes (Signed)
Medical screening examination/treatment/procedure(s) were performed by non-physician practitioner and as supervising physician I was immediately available for consultation/collaboration.  EKG Interpretation   None         Tanna Furry, MD 08/10/13 0004

## 2013-08-16 ENCOUNTER — Encounter (HOSPITAL_COMMUNITY): Payer: Self-pay | Admitting: Radiology

## 2013-08-16 ENCOUNTER — Observation Stay (HOSPITAL_COMMUNITY): Payer: Medicaid Other | Admitting: Certified Registered"

## 2013-08-16 ENCOUNTER — Emergency Department (HOSPITAL_COMMUNITY): Payer: Medicaid Other

## 2013-08-16 ENCOUNTER — Encounter (HOSPITAL_COMMUNITY): Payer: Medicaid Other | Admitting: Certified Registered"

## 2013-08-16 ENCOUNTER — Encounter (HOSPITAL_COMMUNITY): Admission: EM | Disposition: A | Discharge status: Home / Self Care | Payer: Self-pay | Source: Home / Self Care

## 2013-08-16 ENCOUNTER — Inpatient Hospital Stay (HOSPITAL_COMMUNITY)
Admission: EM | Admit: 2013-08-16 | Discharge: 2013-08-22 | DRG: 580 | Disposition: A | Discharge status: Home / Self Care | Payer: Medicaid Other | Attending: General Surgery | Admitting: General Surgery

## 2013-08-16 DIAGNOSIS — Z5900 Homelessness unspecified: Secondary | ICD-10-CM

## 2013-08-16 DIAGNOSIS — G8929 Other chronic pain: Secondary | ICD-10-CM | POA: Diagnosis present

## 2013-08-16 DIAGNOSIS — D72829 Elevated white blood cell count, unspecified: Secondary | ICD-10-CM | POA: Diagnosis present

## 2013-08-16 DIAGNOSIS — S1191XA Laceration without foreign body of unspecified part of neck, initial encounter: Secondary | ICD-10-CM | POA: Diagnosis present

## 2013-08-16 DIAGNOSIS — D62 Acute posthemorrhagic anemia: Secondary | ICD-10-CM | POA: Diagnosis not present

## 2013-08-16 DIAGNOSIS — I739 Peripheral vascular disease, unspecified: Secondary | ICD-10-CM | POA: Diagnosis present

## 2013-08-16 DIAGNOSIS — S1093XA Contusion of unspecified part of neck, initial encounter: Secondary | ICD-10-CM | POA: Diagnosis present

## 2013-08-16 DIAGNOSIS — F17200 Nicotine dependence, unspecified, uncomplicated: Secondary | ICD-10-CM | POA: Diagnosis present

## 2013-08-16 DIAGNOSIS — E876 Hypokalemia: Secondary | ICD-10-CM

## 2013-08-16 DIAGNOSIS — T1491XA Suicide attempt, initial encounter: Secondary | ICD-10-CM | POA: Diagnosis present

## 2013-08-16 DIAGNOSIS — Z7289 Other problems related to lifestyle: Secondary | ICD-10-CM

## 2013-08-16 DIAGNOSIS — F1010 Alcohol abuse, uncomplicated: Secondary | ICD-10-CM

## 2013-08-16 DIAGNOSIS — S1190XA Unspecified open wound of unspecified part of neck, initial encounter: Principal | ICD-10-CM | POA: Diagnosis present

## 2013-08-16 DIAGNOSIS — I872 Venous insufficiency (chronic) (peripheral): Secondary | ICD-10-CM | POA: Diagnosis present

## 2013-08-16 DIAGNOSIS — J45909 Unspecified asthma, uncomplicated: Secondary | ICD-10-CM | POA: Diagnosis present

## 2013-08-16 DIAGNOSIS — F3289 Other specified depressive episodes: Secondary | ICD-10-CM | POA: Diagnosis present

## 2013-08-16 DIAGNOSIS — IMO0002 Reserved for concepts with insufficient information to code with codable children: Secondary | ICD-10-CM

## 2013-08-16 DIAGNOSIS — I509 Heart failure, unspecified: Secondary | ICD-10-CM | POA: Diagnosis present

## 2013-08-16 DIAGNOSIS — Z889 Allergy status to unspecified drugs, medicaments and biological substances status: Secondary | ICD-10-CM

## 2013-08-16 DIAGNOSIS — G5630 Lesion of radial nerve, unspecified upper limb: Secondary | ICD-10-CM | POA: Diagnosis present

## 2013-08-16 DIAGNOSIS — S0083XA Contusion of other part of head, initial encounter: Secondary | ICD-10-CM

## 2013-08-16 DIAGNOSIS — F1020 Alcohol dependence, uncomplicated: Secondary | ICD-10-CM | POA: Diagnosis present

## 2013-08-16 DIAGNOSIS — F329 Major depressive disorder, single episode, unspecified: Secondary | ICD-10-CM | POA: Diagnosis present

## 2013-08-16 DIAGNOSIS — F1210 Cannabis abuse, uncomplicated: Secondary | ICD-10-CM | POA: Diagnosis present

## 2013-08-16 DIAGNOSIS — L97209 Non-pressure chronic ulcer of unspecified calf with unspecified severity: Secondary | ICD-10-CM | POA: Diagnosis present

## 2013-08-16 DIAGNOSIS — F4323 Adjustment disorder with mixed anxiety and depressed mood: Secondary | ICD-10-CM

## 2013-08-16 DIAGNOSIS — S0003XA Contusion of scalp, initial encounter: Secondary | ICD-10-CM | POA: Diagnosis present

## 2013-08-16 DIAGNOSIS — L97919 Non-pressure chronic ulcer of unspecified part of right lower leg with unspecified severity: Secondary | ICD-10-CM | POA: Diagnosis present

## 2013-08-16 DIAGNOSIS — I10 Essential (primary) hypertension: Secondary | ICD-10-CM | POA: Diagnosis present

## 2013-08-16 DIAGNOSIS — X789XXA Intentional self-harm by unspecified sharp object, initial encounter: Secondary | ICD-10-CM | POA: Diagnosis present

## 2013-08-16 HISTORY — DX: Heart failure, unspecified: I50.9

## 2013-08-16 HISTORY — PX: ENDARTERECTOMY: SHX5162

## 2013-08-16 LAB — POCT I-STAT, CHEM 8
BUN: 10 mg/dL (ref 6–23)
CALCIUM ION: 1.04 mmol/L — AB (ref 1.12–1.23)
CHLORIDE: 85 meq/L — AB (ref 96–112)
CREATININE: 1 mg/dL (ref 0.50–1.35)
Glucose, Bld: 101 mg/dL — ABNORMAL HIGH (ref 70–99)
HCT: 47 % (ref 39.0–52.0)
Hemoglobin: 16 g/dL (ref 13.0–17.0)
Potassium: 2.7 mEq/L — CL (ref 3.7–5.3)
Sodium: 132 mEq/L — ABNORMAL LOW (ref 137–147)
TCO2: 35 mmol/L (ref 0–100)

## 2013-08-16 LAB — CBC
HEMATOCRIT: 43.3 % (ref 39.0–52.0)
Hemoglobin: 15.4 g/dL (ref 13.0–17.0)
MCH: 33.7 pg (ref 26.0–34.0)
MCHC: 35.6 g/dL (ref 30.0–36.0)
MCV: 94.7 fL (ref 78.0–100.0)
Platelets: 337 10*3/uL (ref 150–400)
RBC: 4.57 MIL/uL (ref 4.22–5.81)
RDW: 15.4 % (ref 11.5–15.5)
WBC: 15.2 10*3/uL — ABNORMAL HIGH (ref 4.0–10.5)

## 2013-08-16 LAB — COMPREHENSIVE METABOLIC PANEL
ALK PHOS: 86 U/L (ref 39–117)
ALT: 5 U/L (ref 0–53)
AST: 12 U/L (ref 0–37)
Albumin: 3.3 g/dL — ABNORMAL LOW (ref 3.5–5.2)
BUN: 11 mg/dL (ref 6–23)
CALCIUM: 9.3 mg/dL (ref 8.4–10.5)
CO2: 32 mEq/L (ref 19–32)
Chloride: 86 mEq/L — ABNORMAL LOW (ref 96–112)
Creatinine, Ser: 0.8 mg/dL (ref 0.50–1.35)
GFR calc Af Amer: >90 mL/min (ref >90)
GFR calc non Af Amer: >90 mL/min (ref >90)
Glucose, Bld: 103 mg/dL — ABNORMAL HIGH (ref 70–99)
POTASSIUM: 2.8 meq/L — AB (ref 3.7–5.3)
Sodium: 138 mEq/L (ref 137–147)
Total Bilirubin: 0.7 mg/dL (ref 0.3–1.2)
Total Protein: 7.5 g/dL (ref 6.0–8.3)

## 2013-08-16 LAB — SAMPLE TO BLOOD BANK

## 2013-08-16 LAB — CDS SEROLOGY

## 2013-08-16 LAB — CG4 I-STAT (LACTIC ACID): Lactic Acid, Venous: 2.38 mmol/L — ABNORMAL HIGH (ref 0.5–2.2)

## 2013-08-16 LAB — PROTIME-INR
INR: 0.94 (ref 0.00–1.49)
Prothrombin Time: 12.4 seconds (ref 11.6–15.2)

## 2013-08-16 LAB — MRSA PCR SCREENING: MRSA BY PCR: NEGATIVE

## 2013-08-16 LAB — MAGNESIUM: Magnesium: 1.9 mg/dL (ref 1.5–2.5)

## 2013-08-16 SURGERY — ENDARTERECTOMY, CAROTID
Anesthesia: General | Laterality: Left

## 2013-08-16 MED ORDER — PROPOFOL 10 MG/ML IV BOLUS
INTRAVENOUS | Status: DC | PRN
  Administered 2013-08-16: 200 mg via INTRAVENOUS

## 2013-08-16 MED ORDER — HYDROMORPHONE HCL PF 1 MG/ML IJ SOLN
0.2500 mg | INTRAMUSCULAR | Status: DC | PRN
  Administered 2013-08-16 (×2): 0.5 mg via INTRAVENOUS

## 2013-08-16 MED ORDER — ENOXAPARIN SODIUM 40 MG/0.4ML ~~LOC~~ SOLN
40.0000 mg | SUBCUTANEOUS | Status: DC
  Administered 2013-08-16 – 2013-08-21 (×7): 40 mg via SUBCUTANEOUS
  Filled 2013-08-16 (×10): qty 0.4

## 2013-08-16 MED ORDER — 0.9 % SODIUM CHLORIDE (POUR BTL) OPTIME
TOPICAL | Status: DC | PRN
  Administered 2013-08-16: 1000 mL

## 2013-08-16 MED ORDER — OXYCODONE HCL 5 MG PO TABS
5.0000 mg | ORAL_TABLET | ORAL | Status: DC | PRN
  Administered 2013-08-18 – 2013-08-21 (×17): 10 mg via ORAL
  Administered 2013-08-21 (×2): 5 mg via ORAL
  Administered 2013-08-21 – 2013-08-22 (×2): 10 mg via ORAL
  Administered 2013-08-22: 5 mg via ORAL
  Filled 2013-08-16 (×22): qty 2

## 2013-08-16 MED ORDER — ONDANSETRON HCL 4 MG/2ML IJ SOLN: 4.0000 mg | Freq: Four times a day (QID) | INTRAMUSCULAR | Status: DC | PRN

## 2013-08-16 MED ORDER — HYDRALAZINE HCL 20 MG/ML IJ SOLN: 10.0000 mg | INTRAMUSCULAR | Status: DC | PRN

## 2013-08-16 MED ORDER — LACTATED RINGERS IV SOLN
INTRAVENOUS | Status: DC | PRN
  Administered 2013-08-16 (×2): via INTRAVENOUS

## 2013-08-16 MED ORDER — SODIUM CHLORIDE 0.9 % IV SOLN
INTRAVENOUS | Status: DC | PRN
  Administered 2013-08-16: 13:00:00 via INTRAVENOUS

## 2013-08-16 MED ORDER — HYDROMORPHONE HCL PF 1 MG/ML IJ SOLN
INTRAMUSCULAR | Status: AC
  Filled 2013-08-16: qty 1

## 2013-08-16 MED ORDER — LABETALOL HCL 5 MG/ML IV SOLN
10.0000 mg | INTRAVENOUS | Status: DC | PRN
  Filled 2013-08-16: qty 4

## 2013-08-16 MED ORDER — POTASSIUM CHLORIDE 10 MEQ/100ML IV SOLN: 10.0000 meq | INTRAVENOUS | Status: AC

## 2013-08-16 MED ORDER — POTASSIUM CHLORIDE 10 MEQ/50ML IV SOLN
10.0000 meq | INTRAVENOUS | Status: AC
  Administered 2013-08-16 (×2): 10 meq via INTRAVENOUS
  Filled 2013-08-16: qty 50

## 2013-08-16 MED ORDER — TRAMADOL HCL 50 MG PO TABS
50.0000 mg | ORAL_TABLET | Freq: Four times a day (QID) | ORAL | Status: DC | PRN
  Administered 2013-08-17 – 2013-08-21 (×8): 100 mg via ORAL
  Filled 2013-08-16 (×8): qty 2

## 2013-08-16 MED ORDER — ONDANSETRON HCL 4 MG/2ML IJ SOLN
INTRAMUSCULAR | Status: DC | PRN
  Administered 2013-08-16: 4 mg via INTRAVENOUS

## 2013-08-16 MED ORDER — SODIUM CHLORIDE 0.9 % IV SOLN
INTRAVENOUS | Status: DC
  Administered 2013-08-16: 75 mL/h via INTRAVENOUS
  Administered 2013-08-17: 10:00:00 via INTRAVENOUS

## 2013-08-16 MED ORDER — ONDANSETRON HCL 4 MG/2ML IJ SOLN: 4.0000 mg | Freq: Once | INTRAMUSCULAR | Status: DC | PRN

## 2013-08-16 MED ORDER — SODIUM CHLORIDE 0.9 % IJ SOLN
3.0000 mL | Freq: Two times a day (BID) | INTRAMUSCULAR | Status: DC
  Administered 2013-08-16 – 2013-08-17 (×3): 3 mL via INTRAVENOUS

## 2013-08-16 MED ORDER — ALBUMIN HUMAN 5 % IV SOLN
INTRAVENOUS | Status: DC | PRN
  Administered 2013-08-16: 14:00:00 via INTRAVENOUS

## 2013-08-16 MED ORDER — FENTANYL CITRATE 0.05 MG/ML IJ SOLN
INTRAMUSCULAR | Status: DC | PRN
  Administered 2013-08-16: 50 ug via INTRAVENOUS
  Administered 2013-08-16: 100 ug via INTRAVENOUS

## 2013-08-16 MED ORDER — NEOSTIGMINE METHYLSULFATE 1 MG/ML IJ SOLN
INTRAMUSCULAR | Status: DC | PRN
  Administered 2013-08-16: 4 mg via INTRAVENOUS

## 2013-08-16 MED ORDER — CEFUROXIME SODIUM 1.5 G IJ SOLR
1.5000 g | Freq: Two times a day (BID) | INTRAMUSCULAR | Status: AC
  Administered 2013-08-16 – 2013-08-17 (×2): 1.5 g via INTRAVENOUS
  Filled 2013-08-16 (×2): qty 1.5

## 2013-08-16 MED ORDER — SODIUM CHLORIDE 0.9 % IJ SOLN
3.0000 mL | INTRAMUSCULAR | Status: DC | PRN
  Administered 2013-08-17 (×2): 3 mL via INTRAVENOUS

## 2013-08-16 MED ORDER — SUCCINYLCHOLINE CHLORIDE 20 MG/ML IJ SOLN
INTRAMUSCULAR | Status: DC | PRN
  Administered 2013-08-16: 50 mg via INTRAVENOUS

## 2013-08-16 MED ORDER — PANTOPRAZOLE SODIUM 40 MG IV SOLR
40.0000 mg | Freq: Every day | INTRAVENOUS | Status: DC
  Filled 2013-08-16 (×4): qty 40

## 2013-08-16 MED ORDER — ACETAMINOPHEN 325 MG PO TABS
325.0000 mg | ORAL_TABLET | ORAL | Status: DC | PRN
  Administered 2013-08-18: 650 mg via ORAL
  Administered 2013-08-20: 325 mg via ORAL
  Filled 2013-08-16: qty 1
  Filled 2013-08-16: qty 2

## 2013-08-16 MED ORDER — DOCUSATE SODIUM 100 MG PO CAPS
100.0000 mg | ORAL_CAPSULE | Freq: Two times a day (BID) | ORAL | Status: DC
  Administered 2013-08-16 – 2013-08-22 (×12): 100 mg via ORAL
  Filled 2013-08-16 (×11): qty 1

## 2013-08-16 MED ORDER — DEXTROSE 5 % IV SOLN
1.5000 g | INTRAVENOUS | Status: AC
  Administered 2013-08-16: 1.5 g via INTRAVENOUS
  Filled 2013-08-16: qty 1.5

## 2013-08-16 MED ORDER — PHENYLEPHRINE HCL 10 MG/ML IJ SOLN
INTRAMUSCULAR | Status: DC | PRN
  Administered 2013-08-16: 120 ug via INTRAVENOUS
  Administered 2013-08-16: 40 ug via INTRAVENOUS
  Administered 2013-08-16: 80 ug via INTRAVENOUS

## 2013-08-16 MED ORDER — DOCUSATE SODIUM 100 MG PO CAPS: 100.0000 mg | ORAL_CAPSULE | Freq: Every day | ORAL | Status: DC

## 2013-08-16 MED ORDER — LIDOCAINE HCL (CARDIAC) 20 MG/ML IV SOLN
INTRAVENOUS | Status: DC | PRN
  Administered 2013-08-16: 50 mg via INTRAVENOUS

## 2013-08-16 MED ORDER — ONDANSETRON HCL 4 MG PO TABS: 4.0000 mg | ORAL_TABLET | Freq: Four times a day (QID) | ORAL | Status: DC | PRN

## 2013-08-16 MED ORDER — GUAIFENESIN-DM 100-10 MG/5ML PO SYRP: 15.0000 mL | ORAL_SOLUTION | ORAL | Status: DC | PRN

## 2013-08-16 MED ORDER — METOPROLOL TARTRATE 1 MG/ML IV SOLN: 2.0000 mg | INTRAVENOUS | Status: DC | PRN

## 2013-08-16 MED ORDER — ROCURONIUM BROMIDE 100 MG/10ML IV SOLN
INTRAVENOUS | Status: DC | PRN
  Administered 2013-08-16: 50 mg via INTRAVENOUS
  Administered 2013-08-16: 10 mg via INTRAVENOUS

## 2013-08-16 MED ORDER — POTASSIUM CHLORIDE CRYS ER 20 MEQ PO TBCR
20.0000 meq | EXTENDED_RELEASE_TABLET | Freq: Every day | ORAL | Status: DC | PRN
  Filled 2013-08-16: qty 2

## 2013-08-16 MED ORDER — DOPAMINE-DEXTROSE 3.2-5 MG/ML-% IV SOLN: 3.0000 ug/kg/min | INTRAVENOUS | Status: DC

## 2013-08-16 MED ORDER — PANTOPRAZOLE SODIUM 40 MG PO TBEC: 40.0000 mg | DELAYED_RELEASE_TABLET | Freq: Every day | ORAL | Status: DC

## 2013-08-16 MED ORDER — SODIUM CHLORIDE 0.9 % IV SOLN: 250.0000 mL | INTRAVENOUS | Status: DC | PRN

## 2013-08-16 MED ORDER — PANTOPRAZOLE SODIUM 40 MG PO TBEC
40.0000 mg | DELAYED_RELEASE_TABLET | Freq: Every day | ORAL | Status: DC
  Administered 2013-08-16 – 2013-08-21 (×6): 40 mg via ORAL
  Filled 2013-08-16 (×5): qty 1

## 2013-08-16 MED ORDER — ASPIRIN EC 325 MG PO TBEC
325.0000 mg | DELAYED_RELEASE_TABLET | Freq: Every day | ORAL | Status: DC
  Administered 2013-08-17 – 2013-08-22 (×6): 325 mg via ORAL
  Filled 2013-08-16 (×6): qty 1

## 2013-08-16 MED ORDER — MIDAZOLAM HCL 5 MG/5ML IJ SOLN
INTRAMUSCULAR | Status: DC | PRN
  Administered 2013-08-16: 2 mg via INTRAVENOUS

## 2013-08-16 MED ORDER — PHENOL 1.4 % MT LIQD
1.0000 | OROMUCOSAL | Status: DC | PRN
  Administered 2013-08-17: 1 via OROMUCOSAL
  Filled 2013-08-16: qty 177

## 2013-08-16 MED ORDER — IOHEXOL 350 MG/ML SOLN
50.0000 mL | Freq: Once | INTRAVENOUS | Status: AC | PRN
  Administered 2013-08-16: 50 mL via INTRAVENOUS

## 2013-08-16 MED ORDER — OXYCODONE HCL 5 MG PO TABS: 5.0000 mg | ORAL_TABLET | Freq: Once | ORAL | Status: DC | PRN

## 2013-08-16 MED ORDER — ARTIFICIAL TEARS OP OINT
TOPICAL_OINTMENT | OPHTHALMIC | Status: DC | PRN
  Administered 2013-08-16: 1 via OPHTHALMIC

## 2013-08-16 MED ORDER — OXYCODONE HCL 5 MG/5ML PO SOLN: 5.0000 mg | Freq: Once | ORAL | Status: DC | PRN

## 2013-08-16 MED ORDER — GLYCOPYRROLATE 0.2 MG/ML IJ SOLN
INTRAMUSCULAR | Status: DC | PRN
  Administered 2013-08-16: .6 mg via INTRAVENOUS

## 2013-08-16 MED ORDER — POTASSIUM CHLORIDE 10 MEQ/50ML IV SOLN
10.0000 meq | INTRAVENOUS | Status: DC
  Filled 2013-08-16: qty 50

## 2013-08-16 MED ORDER — ALUM & MAG HYDROXIDE-SIMETH 200-200-20 MG/5ML PO SUSP: 15.0000 mL | ORAL | Status: DC | PRN

## 2013-08-16 MED ORDER — THROMBIN 20000 UNITS EX SOLR
CUTANEOUS | Status: AC
  Filled 2013-08-16: qty 20000

## 2013-08-16 MED ORDER — BACITRACIN ZINC 500 UNIT/GM EX OINT
TOPICAL_OINTMENT | Freq: Two times a day (BID) | CUTANEOUS | Status: DC
  Administered 2013-08-16: 1 via TOPICAL
  Administered 2013-08-17: 11:00:00 via TOPICAL
  Administered 2013-08-17: 1 via TOPICAL
  Administered 2013-08-18: 10:00:00 via TOPICAL
  Administered 2013-08-19: 15.5556 via TOPICAL
  Administered 2013-08-19 – 2013-08-21 (×6): via TOPICAL
  Administered 2013-08-22: 15.5556 via TOPICAL
  Filled 2013-08-16 (×2): qty 15
  Filled 2013-08-16: qty 28.35

## 2013-08-16 MED ORDER — POLYETHYLENE GLYCOL 3350 17 G PO PACK
17.0000 g | PACK | Freq: Every day | ORAL | Status: DC
  Administered 2013-08-20 – 2013-08-22 (×2): 17 g via ORAL
  Filled 2013-08-16 (×7): qty 1

## 2013-08-16 MED ORDER — ACETAMINOPHEN 650 MG RE SUPP: 325.0000 mg | RECTAL | Status: DC | PRN

## 2013-08-16 MED ORDER — MORPHINE SULFATE 2 MG/ML IJ SOLN
2.0000 mg | INTRAMUSCULAR | Status: DC | PRN
  Filled 2013-08-16: qty 1

## 2013-08-16 MED ORDER — SODIUM CHLORIDE 0.9 % IV SOLN: 500.0000 mL | Freq: Once | INTRAVENOUS | Status: AC | PRN

## 2013-08-16 MED ORDER — MORPHINE SULFATE 2 MG/ML IJ SOLN
2.0000 mg | INTRAMUSCULAR | Status: DC | PRN
  Administered 2013-08-16: 4 mg via INTRAVENOUS
  Administered 2013-08-16: 2 mg via INTRAVENOUS
  Administered 2013-08-16 – 2013-08-17 (×4): 4 mg via INTRAVENOUS
  Filled 2013-08-16 (×5): qty 2

## 2013-08-16 SURGICAL SUPPLY — 49 items
BANDAGE GAUZE ELAST BULKY 4 IN (GAUZE/BANDAGES/DRESSINGS) ×2 IMPLANT
CANISTER SUCTION 2500CC (MISCELLANEOUS) ×2 IMPLANT
CANNULA VESSEL 3MM 2 BLNT TIP (CANNULA) ×2 IMPLANT
CATH ROBINSON RED A/P 18FR (CATHETERS) IMPLANT
CLIP TI MEDIUM 6 (CLIP) ×2 IMPLANT
CLIP TI WIDE RED SMALL 6 (CLIP) ×2 IMPLANT
COVER SURGICAL LIGHT HANDLE (MISCELLANEOUS) ×2 IMPLANT
CRADLE DONUT ADULT HEAD (MISCELLANEOUS) ×2 IMPLANT
DECANTER SPIKE VIAL GLASS SM (MISCELLANEOUS) IMPLANT
DERMABOND ADVANCED (GAUZE/BANDAGES/DRESSINGS)
DERMABOND ADVANCED .7 DNX12 (GAUZE/BANDAGES/DRESSINGS) IMPLANT
DRAIN HEMOVAC 1/8 X 5 (WOUND CARE) IMPLANT
DRAIN JACKSON PRATT 10MM FLAT (MISCELLANEOUS) ×2 IMPLANT
DRAPE WARM FLUID 44X44 (DRAPE) IMPLANT
DRSG ADAPTIC 3X8 NADH LF (GAUZE/BANDAGES/DRESSINGS) ×2 IMPLANT
ELECT REM PT RETURN 9FT ADLT (ELECTROSURGICAL) ×2
ELECTRODE REM PT RTRN 9FT ADLT (ELECTROSURGICAL) ×1 IMPLANT
EVACUATOR SILICONE 100CC (DRAIN) ×2 IMPLANT
GEL ULTRASOUND 20GR AQUASONIC (MISCELLANEOUS) IMPLANT
GLOVE BIO SURGEON STRL SZ7.5 (GLOVE) ×2 IMPLANT
GOWN STRL REUS W/ TWL LRG LVL3 (GOWN DISPOSABLE) ×3 IMPLANT
GOWN STRL REUS W/TWL LRG LVL3 (GOWN DISPOSABLE) ×3
KIT BASIN OR (CUSTOM PROCEDURE TRAY) ×2 IMPLANT
KIT ROOM TURNOVER OR (KITS) ×2 IMPLANT
LOOP VESSEL MINI RED (MISCELLANEOUS) IMPLANT
NEEDLE HYPO 25GX1X1/2 BEV (NEEDLE) IMPLANT
NS IRRIG 1000ML POUR BTL (IV SOLUTION) ×4 IMPLANT
PACK CAROTID (CUSTOM PROCEDURE TRAY) ×2 IMPLANT
PAD ARMBOARD 7.5X6 YLW CONV (MISCELLANEOUS) ×4 IMPLANT
SHUNT CAROTID BYPASS 10 (VASCULAR PRODUCTS) IMPLANT
SHUNT CAROTID BYPASS 12FRX15.5 (VASCULAR PRODUCTS) IMPLANT
SPONGE GAUZE 4X4 12PLY (GAUZE/BANDAGES/DRESSINGS) ×2 IMPLANT
SPONGE SURGIFOAM ABS GEL 100 (HEMOSTASIS) IMPLANT
SUT ETHILON 2 0 FS 18 (SUTURE) ×2 IMPLANT
SUT ETHILON 3 0 PS 1 (SUTURE) ×2 IMPLANT
SUT PROLENE 6 0 CC (SUTURE) ×2 IMPLANT
SUT PROLENE 7 0 BV 1 (SUTURE) IMPLANT
SUT SILK 3 0 TIES 17X18 (SUTURE)
SUT SILK 3-0 18XBRD TIE BLK (SUTURE) IMPLANT
SUT VIC AB 3-0 SH 27 (SUTURE) ×2
SUT VIC AB 3-0 SH 27X BRD (SUTURE) ×2 IMPLANT
SUT VICRYL 4-0 PS2 18IN ABS (SUTURE) ×2 IMPLANT
SYR BULB IRRIGATION 50ML (SYRINGE) ×2 IMPLANT
SYR CONTROL 10ML LL (SYRINGE) IMPLANT
TAPE CLOTH SURG 4X10 WHT LF (GAUZE/BANDAGES/DRESSINGS) ×2 IMPLANT
TOWEL OR 17X24 6PK STRL BLUE (TOWEL DISPOSABLE) ×2 IMPLANT
TOWEL OR 17X26 10 PK STRL BLUE (TOWEL DISPOSABLE) ×2 IMPLANT
WATER STERILE IRR 1000ML POUR (IV SOLUTION) ×2 IMPLANT
YANKAUER SUCT BULB TIP NO VENT (SUCTIONS) ×2 IMPLANT

## 2013-08-16 NOTE — ED Provider Notes (Signed)
CSN: [id number]     Arrival date & time 08/16/13  1132 History   First MD Initiated Contact with Patient 08/16/13 1149     Chief Complaint:  Self inflected knife wound to neck   (Consider location/radiation/quality/duration/timing/severity/associated sxs/prior Treatment) The history is provided by the patient and the EMS personnel.  pt cut left neck w box cutter just pta today.  Pt indicates was being kicked out of current residence, was going to be newly homeless, was afraid about prospect of being homeless, so cut neck.  He denies hx depression or feeling depressed.  Denies any other injury, ingestion or attempt at self harm. Tetanus unknown. Mild bleeding, slow steady ooze from site per ems. Pt denies feeling faint or dizzy. No cp or sob. No trouble breathing or swallowing. States is supposed to take bp meds but has been out for long while. Smoker. Hx etoh abuse, states doesn't drink daily, but several x per week.   No past medical history on file. No past surgical history on file. No family history on file. History  Substance Use Topics  . Smoking status: Not on file  . Smokeless tobacco: Not on file  . Alcohol Use: Not on file    Review of Systems  Constitutional: Negative for fever.  HENT: Negative for trouble swallowing and voice change.   Eyes: Negative for redness.  Respiratory: Negative for shortness of breath.   Cardiovascular: Negative for chest pain.  Gastrointestinal: Negative for vomiting and abdominal pain.  Genitourinary: Negative for flank pain.  Musculoskeletal: Negative for back pain and neck pain.  Skin: Negative for rash.  Neurological: Negative for headaches.  Hematological: Does not bruise/bleed easily.  Psychiatric/Behavioral: Negative for confusion.    Allergies  Review of patient's allergies indicates not on file.  Home Medications  No current outpatient prescriptions on file. There were no vitals taken for this visit. Physical Exam  Nursing note  and vitals reviewed. Constitutional: He is oriented to person, place, and time. He appears well-developed and well-nourished. No distress.  HENT:  Nose: Nose normal.  Mouth/Throat: Oropharynx is clear and moist.  Eyes: Conjunctivae are normal. Pupils are equal, round, and reactive to light. No scleral icterus.  Neck: Normal range of motion. Neck supple. No tracheal deviation present.  4 cm laceration to left ant/lat neck. V slow ooze blood from wound.   Cardiovascular: Normal rate, regular rhythm, normal heart sounds and intact distal pulses.   Pulmonary/Chest: Effort normal and breath sounds normal. No accessory muscle usage. No respiratory distress. He exhibits no tenderness.  Abdominal: Soft. He exhibits no distension. There is no tenderness.  Musculoskeletal: Normal range of motion. He exhibits no edema and no tenderness.  Neurological: He is alert and oriented to person, place, and time.  Skin: Skin is warm and dry. He is not diaphoretic.  Psychiatric: He has a normal mood and affect.    ED Course  Procedures (including critical care time)  Results for orders placed during the hospital encounter of 08/16/13  CBC      Result Value Range   WBC 15.2 (*) 4.0 - 10.5 K/uL   RBC 4.57  4.22 - 5.81 MIL/uL   Hemoglobin 15.4  13.0 - 17.0 g/dL   HCT 43.3  39.0 - 52.0 %   MCV 94.7  78.0 - 100.0 fL   MCH 33.7  26.0 - 34.0 pg   MCHC 35.6  30.0 - 36.0 g/dL   RDW 15.4  11.5 - 15.5 %   Platelets  337  150 - 400 K/uL  PROTIME-INR      Result Value Range   Prothrombin Time 12.4  11.6 - 15.2 seconds   INR 0.94  0.00 - 1.49  POCT I-STAT, CHEM 8      Result Value Range   Sodium 132 (*) 137 - 147 mEq/L   Potassium 2.7 (*) 3.7 - 5.3 mEq/L   Chloride 85 (*) 96 - 112 mEq/L   BUN 10  6 - 23 mg/dL   Creatinine, Ser 1.00  0.50 - 1.35 mg/dL   Glucose, Bld 101 (*) 70 - 99 mg/dL   Calcium, Ion 1.04 (*) 1.12 - 1.23 mmol/L   TCO2 35  0 - 100 mmol/L   Hemoglobin 16.0  13.0 - 17.0 g/dL   HCT 47.0  39.0  - 52.0 %   Comment NOTIFIED PHYSICIAN    CG4 I-STAT (LACTIC ACID)      Result Value Range   Lactic Acid, Venous 2.38 (*) 0.5 - 2.2 mmol/L  SAMPLE TO BLOOD BANK      Result Value Range   Blood Bank Specimen SAMPLE AVAILABLE FOR TESTING     Sample Expiration 08/17/2013      EKG Interpretation    Date/Time:  Tuesday August 16 2013 11:56:10 EST Ventricular Rate:  104 PR Interval:  155 QRS Duration: 87 QT Interval:  321 QTC Calculation: 422 R Axis:   66 Text Interpretation:  Sinus tachycardia Nonspecific ST abnormality Baseline wander in lead(s) V4 No previous tracing Confirmed by Renell Allum  MD, Shene Maxfield ([id number]) on 08/16/2013 12:04:47 PM            MDM  Iv ns x 2.   Trauma team in ED - they have decided to do ct angio neck, w subsequent wound closure per their service.  Reviewed nursing notes and prior charts for additional history.   kcl iv. Mg added to labs.  Trauma surgery contacting vascular - plan to or.      Mirna Mires, MD 08/17/13 0930

## 2013-08-16 NOTE — Anesthesia Procedure Notes (Signed)
Procedure Name: Intubation Date/Time: 08/16/2013 1:21 PM Performed by: Neldon Newport Pre-anesthesia Checklist: Patient identified, Timeout performed, Emergency Drugs available, Patient being monitored and Suction available Patient Re-evaluated:Patient Re-evaluated prior to inductionOxygen Delivery Method: Circle system utilized Preoxygenation: Pre-oxygenation with 100% oxygen Intubation Type: IV induction Ventilation: Mask ventilation without difficulty Laryngoscope Size: Mac and 4 Grade View: Grade I Tube type: Oral Tube size: 8.0 mm Number of attempts: 1 Placement Confirmation: ETT inserted through vocal cords under direct vision,  positive ETCO2 and breath sounds checked- equal and bilateral Secured at: 23 cm Tube secured with: Tape Dental Injury: Teeth and Oropharynx as per pre-operative assessment

## 2013-08-16 NOTE — Anesthesia Preprocedure Evaluation (Signed)
Anesthesia Evaluation  Patient identified by MRN, date of birth, ID band Patient awake    Reviewed: Allergy & Precautions, H&P , NPO status , Patient's Chart, lab work & pertinent test results  Airway Mallampati: II TM Distance: >3 FB Neck ROM: limited    Dental  (+) Poor Dentition   Pulmonary asthma , Current Smoker,          Cardiovascular hypertension, + Peripheral Vascular Disease and +CHF Rhythm:regular Rate:Tachycardia     Neuro/Psych    GI/Hepatic   Endo/Other    Renal/GU      Musculoskeletal   Abdominal   Peds  Hematology   Anesthesia Other Findings   Reproductive/Obstetrics                           Anesthesia Physical Anesthesia Plan  ASA: IV and emergent  Anesthesia Plan: General ETT   Post-op Pain Management:    Induction:   Airway Management Planned:   Additional Equipment:   Intra-op Plan:   Post-operative Plan:   Informed Consent: I have reviewed the patients History and Physical, chart, labs and discussed the procedure including the risks, benefits and alternatives for the proposed anesthesia with the patient or authorized representative who has indicated his/her understanding and acceptance.   Dental Advisory Given  Plan Discussed with: Anesthesiologist, CRNA and Surgeon  Anesthesia Plan Comments:         Anesthesia Quick Evaluation

## 2013-08-16 NOTE — H&P (Signed)
Seen together and D/W Dr. Oneida Alar at the bedside. See my progress note for photos. Patient examined and I agree with the assessment and plan  Georganna Skeans, MD, MPH, FACS Pager: [phone]  08/16/2013 4:58 PM

## 2013-08-16 NOTE — ED Notes (Signed)
PHYSICIAN IN [facility] NOTIFIED IN PERSON OF PANIC LAB RESULTS @12 :44 PM, 08/16/2013.

## 2013-08-16 NOTE — Transfer of Care (Signed)
Immediate Anesthesia Transfer of Care Note  Patient: Edwin [name]  Procedure(s) Performed: Procedure(s): Exploration of Left Neck/Fix Bleeding (Left)  Patient Location: PACU  Anesthesia Type:General  Level of Consciousness: awake, alert  and oriented  Airway & Oxygen Therapy: Patient Spontanous Breathing  Post-op Assessment: Report given to PACU RN  Post vital signs: Reviewed and stable  Complications: No apparent anesthesia complications

## 2013-08-16 NOTE — Preoperative (Signed)
Beta Blockers   Reason not to administer Beta Blockers:Not Applicable 

## 2013-08-16 NOTE — Progress Notes (Signed)
Patient ID: Edwin [name], male   DOB: [birth year], 57 y.o.   MRN: [id number] Full H&P to follow.    Georganna Skeans, MD, MPH, FACS Pager: [phone]
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
## 2013-08-16 NOTE — Progress Notes (Signed)
Chaplain received level one page. Chaplain arrived and found staff preparing to work on the patient. No family present. Ed to contact chaplain if needed.   08/16/13 1145  Clinical Encounter Type  Visited With Health care provider  Visit Type Initial;ED;Trauma  Referral From Nurse

## 2013-08-16 NOTE — Anesthesia Postprocedure Evaluation (Signed)
  Anesthesia Post-op Note  Patient: Edwin [name]  Procedure(s) Performed: Procedure(s): Exploration of Left Neck/Fix Bleeding (Left)  Patient Location: PACU  Anesthesia Type:General  Level of Consciousness: awake, alert  and oriented  Airway and Oxygen Therapy: Patient Spontanous Breathing  Post-op Pain: mild  Post-op Assessment: Post-op Vital signs reviewed, Patient's Cardiovascular Status Stable, Respiratory Function Stable, Patent Airway and Pain level controlled  Post-op Vital Signs: Reviewed and stable  Complications: No apparent anesthesia complications

## 2013-08-17 DIAGNOSIS — F321 Major depressive disorder, single episode, moderate: Secondary | ICD-10-CM

## 2013-08-17 DIAGNOSIS — X789XXA Intentional self-harm by unspecified sharp object, initial encounter: Secondary | ICD-10-CM

## 2013-08-17 DIAGNOSIS — E876 Hypokalemia: Secondary | ICD-10-CM

## 2013-08-17 LAB — URINALYSIS, ROUTINE W REFLEX MICROSCOPIC
BILIRUBIN URINE: NEGATIVE
Glucose, UA: NEGATIVE mg/dL
Hgb urine dipstick: NEGATIVE
Ketones, ur: NEGATIVE mg/dL
Nitrite: NEGATIVE
PROTEIN: NEGATIVE mg/dL
Specific Gravity, Urine: 1.022 (ref 1.005–1.030)
UROBILINOGEN UA: 1 mg/dL (ref 0.0–1.0)
pH: 6 (ref 5.0–8.0)

## 2013-08-17 LAB — CBC
HEMATOCRIT: 26.7 % — AB (ref 39.0–52.0)
Hemoglobin: 9 g/dL — ABNORMAL LOW (ref 13.0–17.0)
MCH: 32.5 pg (ref 26.0–34.0)
MCHC: 33.7 g/dL (ref 30.0–36.0)
MCV: 96.4 fL (ref 78.0–100.0)
PLATELETS: 230 10*3/uL (ref 150–400)
RBC: 2.77 MIL/uL — ABNORMAL LOW (ref 4.22–5.81)
RDW: 15.6 % — ABNORMAL HIGH (ref 11.5–15.5)
WBC: 14.9 10*3/uL — ABNORMAL HIGH (ref 4.0–10.5)

## 2013-08-17 LAB — URINE MICROSCOPIC-ADD ON

## 2013-08-17 LAB — BASIC METABOLIC PANEL
BUN: 11 mg/dL (ref 6–23)
CALCIUM: 7.5 mg/dL — AB (ref 8.4–10.5)
CHLORIDE: 90 meq/L — AB (ref 96–112)
CO2: 29 mEq/L (ref 19–32)
CREATININE: 0.86 mg/dL (ref 0.50–1.35)
Glucose, Bld: 136 mg/dL — ABNORMAL HIGH (ref 70–99)
Potassium: 3.1 mEq/L — ABNORMAL LOW (ref 3.7–5.3)
Sodium: 131 mEq/L — ABNORMAL LOW (ref 137–147)

## 2013-08-17 MED ORDER — NON FORMULARY: Freq: Two times a day (BID) | Status: DC

## 2013-08-17 MED ORDER — POTASSIUM CHLORIDE CRYS ER 20 MEQ PO TBCR
40.0000 meq | EXTENDED_RELEASE_TABLET | Freq: Two times a day (BID) | ORAL | Status: DC
  Administered 2013-08-17 – 2013-08-22 (×11): 40 meq via ORAL
  Filled 2013-08-17 (×12): qty 2

## 2013-08-17 MED ORDER — ALBUTEROL SULFATE (2.5 MG/3ML) 0.083% IN NEBU
2.5000 mg | INHALATION_SOLUTION | RESPIRATORY_TRACT | Status: DC | PRN
  Administered 2013-08-17: 2.5 mg via RESPIRATORY_TRACT
  Filled 2013-08-17: qty 3

## 2013-08-17 MED ORDER — HYDROMORPHONE HCL PF 1 MG/ML IJ SOLN
1.0000 mg | INTRAMUSCULAR | Status: DC | PRN
  Administered 2013-08-17 (×2): 1 mg via INTRAVENOUS
  Administered 2013-08-17: 2 mg via INTRAVENOUS
  Administered 2013-08-18 – 2013-08-19 (×5): 1 mg via INTRAVENOUS
  Filled 2013-08-17 (×6): qty 1
  Filled 2013-08-17: qty 2
  Filled 2013-08-17 (×2): qty 1

## 2013-08-17 MED ORDER — DAKINS (1/2 STRENGTH) 0.25 % EX SOLN
Freq: Two times a day (BID) | CUTANEOUS | Status: AC
  Administered 2013-08-18 – 2013-08-22 (×9)
  Filled 2013-08-17 (×2): qty 473

## 2013-08-17 MED ORDER — POTASSIUM CHLORIDE 20 MEQ PO PACK
40.0000 meq | PACK | Freq: Two times a day (BID) | ORAL | Status: DC
  Filled 2013-08-17 (×4): qty 2
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
## 2013-08-17 NOTE — Consult Note (Signed)
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 Wound type: Arterial ulcer to right lower leg.  Seen at [facility], Dr Jerline Pain.  Measurement:7.3 cm x 18 cm unable to visualize wound bed due to presence of green, thick devitalized tissue. Open wound extends around leg circumferentially.  Wound bed: 100% green, stringy, adherent devitalized tissue.  Drainage (amount, consistency, odor) Heavy, green drainage, musty odor.  Periwound: Punched out appearance to wound, no erythema or induration.  Dressing procedure/placement/frequency: Cleanse with NS and pat gently dry.  Apply Dakin's moistened gauze to wound bed to debride and manage odor.  Continue x 5 days.  WIll re-evaluate Monday 08/22/13.  Top with ABD pads and kerlix.  Secure with tape.  [facility] nursing will continue to  follow at this time and re-evaluate on Monday 08/22/13 Domenic Moras RN [place] Pager [phone]
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
## 2013-08-18 ENCOUNTER — Other Ambulatory Visit: Payer: Self-pay | Admitting: *Deleted

## 2013-08-18 ENCOUNTER — Encounter (HOSPITAL_COMMUNITY): Payer: Self-pay | Admitting: Vascular Surgery

## 2013-08-18 DIAGNOSIS — Z48812 Encounter for surgical aftercare following surgery on the circulatory system: Secondary | ICD-10-CM

## 2013-08-18 DIAGNOSIS — G5630 Lesion of radial nerve, unspecified upper limb: Secondary | ICD-10-CM

## 2013-08-18 DIAGNOSIS — S15009A Unspecified injury of unspecified carotid artery, initial encounter: Secondary | ICD-10-CM

## 2013-08-18 MED ORDER — LISINOPRIL 20 MG PO TABS
20.0000 mg | ORAL_TABLET | Freq: Every day | ORAL | Status: DC
  Administered 2013-08-18 – 2013-08-22 (×5): 20 mg via ORAL
  Filled 2013-08-18 (×5): qty 1

## 2013-08-18 MED ORDER — COLLAGENASE 250 UNIT/GM EX OINT
1.0000 "application " | TOPICAL_OINTMENT | Freq: Every day | CUTANEOUS | Status: DC
  Administered 2013-08-18 – 2013-08-22 (×5): 1 via TOPICAL
  Filled 2013-08-18 (×4): qty 30

## 2013-08-18 MED ORDER — ALPRAZOLAM 0.5 MG PO TABS
0.5000 mg | ORAL_TABLET | Freq: Every evening | ORAL | Status: DC | PRN
  Administered 2013-08-18 – 2013-08-22 (×4): 0.5 mg via ORAL
  Filled 2013-08-18 (×4): qty 1

## 2013-08-18 MED ORDER — FUROSEMIDE 40 MG PO TABS
40.0000 mg | ORAL_TABLET | Freq: Two times a day (BID) | ORAL | Status: DC
  Administered 2013-08-18 – 2013-08-22 (×9): 40 mg via ORAL
  Filled 2013-08-18 (×10): qty 1
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
## 2013-08-18 NOTE — Progress Notes (Signed)
Report called to receiving RN on  [facility]. VSS. Transferred to 6N 16 via wheelchair with personal belongings. Safety sitter is transferring with patient.  [facility]

## 2013-08-18 NOTE — Progress Notes (Signed)
[facility] [place]  Subjective  - Feels better, still taking a fair amount of Dilaudid mainly for leg pain   Objective 124/67 90 98.3 F (36.8 C) (Oral) 17 99%  Intake/Output Summary (Last 24 hours) at 08/18/13 0705 Last data filed at 08/18/13 0600  Gross per 24 hour  Intake 2369.17 ml  Output    935 ml  Net 1434.17 ml   Left neck incision healing small hematoma upper aspect JP minimal  Assessment/Planning: Left neck exploration will d/c JP today.  Can d/c home from my standpoint.  He needs follow up in 2 weeks.  He also will need a carotid duplex in 6 months  Right leg wound have offered pt amputation but at this point he has opted for continued wound care.  I will be away until next Tuesday.  Please call MD on call for our service if questions.  FIELDS,CHARLES E 08/18/2013 7:05 AM --  Laboratory Lab Results:  Recent Labs  08/16/13 1221 08/16/13 1242 08/17/13 0405  WBC 15.2*  --  14.9*  HGB 15.4 16.0 9.0*  HCT 43.3 47.0 26.7*  PLT 337  --  230   BMET  Recent Labs  08/16/13 1221 08/16/13 1242 08/17/13 0405  NA 138 132* 131*  K 2.8* 2.7* 3.1*  CL 86* 85* 90*  CO2 32  --  29  GLUCOSE 103* 101* 136*  BUN 11 10 11   CREATININE 0.80 1.00 0.86  CALCIUM 9.3  --  7.5*    COAG Lab Results  Component Value Date   INR 0.94 08/16/2013   No results found for this basename: PTT

## 2013-08-18 NOTE — Clinical Social Work Psych Assess (Signed)
Clinical Social Work Department CLINICAL SOCIAL WORK PSYCHIATRY SERVICE LINE ASSESSMENT 08/18/2013  Patient:  Edwin [name]  Account:  [id number]  [place] Date:  08/16/2013  Clinical Social Worker:  Wylene Men  Date/Time:  08/18/2013 03:32 PM Referred by:  Physician  Date referred:  08/18/2013 Reason for Referral  Crisis Intervention  [place] Issues   Presenting Symptoms/Problems (In the person's/family's own words):   Psych was consulted for suicide attempt (cut throat) and depression   Abuse/Neglect/Trauma History (check all that apply)  Denies history   Abuse/Neglect/Trauma Comments:   none   Psychiatric History (check all that apply)  Denies history   Psychiatric medications:  pt denies every being evaluated for psychiatric issues or being prescribed any psych meds   Current Mental Health Hospitalizations/Previous Mental Health History:   none reported or noted in chart   Current provider:   none   Place and Date:   none   Current Medications:   none   Previous Impatient Admission/Date/Reason:   none   Emotional Health / Current Symptoms    Suicide/Self Harm  Suicide attempt in past (date/description)   Suicide attempt in the past:   Pt admitted to [facility] after suicide attempt.   Other harmful behavior:   none   Psychotic/Dissociative Symptoms  None reported   Other Psychotic/Dissociative Symptoms:   none reported or noted in the chart    Attention/Behavioral Symptoms  Restless  Withdrawn   Other Attention / Behavioral Symptoms:   none    Cognitive Impairment  Orientation - Place  Orientation - Self  Orientation - Situation  Orientation - Time  Poor Judgement  Poor/Impaired Decision-Making   Other Cognitive Impairment:   none reported other than those listed above    Mood and Adjustment  DEPRESSION  Anxious    Stress, Anxiety, Trauma, Any Recent Loss/Stressor  Grief/Loss (recent or history)  Anxiety    Anxiety (frequency):   pt exhibited severe situational anxiety surrounding the issues of homelessness and unknown future   Phobia (specify):   being homeless   Compulsive behavior (specify):   none reported or noted in chart   Obsessive behavior (specify):   none reported or noted in chart   Other:   Pt is currently unemployed after losing his job last week. Pt had no job and no means to pay this month's rent.  Pt has been seperated from his significant other as of September 2014 and states reuniting is not an option.   Substance Abuse/Use  None   SBIRT completed (please refer for detailed history):  Y  Self-reported substance use:   untested   Urinary Drug Screen Completed:  N Alcohol level:   untested    Environmental/Housing/Living Arrangement  HOMELESS   Who is in the home:   pt is currently homeless.  Prior to hospital admission, pt was rooming at a hotel   Emergency contact:  none    Patient's Strengths and Goals (patient's own words):   Pt is compliant with medical advice.   Clinical Social Worker's Interpretive Summary:   Psych CSW met with pt at bedside.  Pt is accompanied by suicide sitter which has been ordered and remains in place for pt throughout hospital stay.  Sitter remained during assessment.    Pt was in wheelchair (for transportation-pt being transferred to 6N) eating lunch. Pt was alert and oriented x4.  Pt was pleasant and cooperative throughout assessment. Pt maintained good eye contact and conversations/responses remained appropriate.  Pt denies AVHD.  Pt denies HI.  Pt continues to endorse sx of depression: anhedonia, hopelessness, worthlessness, thoughts of dying and suicide, decreased appetite, unstable sleep    Pt has been admitted to [facility] after suicide attempt.  Pt reports that this is the first time that he has attempted to hurt himself.  Pt also reports that this is the first time that he has given it serious thought.  Pt cannot  contract for safety at this time.  Pt remains feeling hopeless reagrding his dc from the hospital.  Pt fears being homeless.  Pt states that he cannot work due to the neuropathy in his left hand.  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  The patient is no longer able to lift due to his hand not being able to "grasp" the reams. Pt stated that he was interested in getting this issue checked out before he leaves the hospital.  This was discussed with the trauma CSW.    Pt stated that he needed to use the phone to try to arrange some sort of housing once he is dc.  Sitter at bedside tells pt that he can use her phone whenever he wishes.    pt has wound on leg that wound care has been consulted for per sitter.  Pt states that the cream that he has been given to use on an op basis has not been helping heal the wound.  Pt states that the wound is very painful when walking.  CSW will request PT/OT consult if MD feels medically appropriate to address ambulation issues.    Barrier to dc: pt has open wound on leg.  Shelters unlikely to accept the pt as resident with open wound due to the risk of infection.    Psych CSW reviewed the possible PT recommendations with pt. If PT recommends SNF, pt has no insurance and will be LOG. Pt is aware of this and is agreeable to SNF search.  Pt is also understanding that the SNF search for LOG may not be local and CSW may need to search outside of the [place] area for STR/SNF.  pt is agreeable.  Psych CSW gave report to trauma CSW who will assist with this upon PT recommendations being made.   Disposition:  Recommend Psych CSW continuing to support while in hospital [place], [place] [phone]  Clinical Social Work
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
## 2013-08-19 ENCOUNTER — Telehealth: Payer: Self-pay | Admitting: Vascular Surgery

## 2013-08-19 DIAGNOSIS — D72829 Elevated white blood cell count, unspecified: Secondary | ICD-10-CM

## 2013-08-19 DIAGNOSIS — D62 Acute posthemorrhagic anemia: Secondary | ICD-10-CM

## 2013-08-19 LAB — CBC
HCT: 27.5 % — ABNORMAL LOW (ref 39.0–52.0)
Hemoglobin: 9.2 g/dL — ABNORMAL LOW (ref 13.0–17.0)
MCH: 32.5 pg (ref 26.0–34.0)
MCHC: 33.5 g/dL (ref 30.0–36.0)
MCV: 97.2 fL (ref 78.0–100.0)
Platelets: 301 10*3/uL (ref 150–400)
RBC: 2.83 MIL/uL — ABNORMAL LOW (ref 4.22–5.81)
RDW: 15.3 % (ref 11.5–15.5)
WBC: 10.9 10*3/uL — ABNORMAL HIGH (ref 4.0–10.5)

## 2013-08-19 LAB — BASIC METABOLIC PANEL
BUN: 7 mg/dL (ref 6–23)
CO2: 31 mEq/L (ref 19–32)
CREATININE: 0.71 mg/dL (ref 0.50–1.35)
Calcium: 7.9 mg/dL — ABNORMAL LOW (ref 8.4–10.5)
Chloride: 92 mEq/L — ABNORMAL LOW (ref 96–112)
Glucose, Bld: 85 mg/dL (ref 70–99)
POTASSIUM: 4.8 meq/L (ref 3.7–5.3)
Sodium: 132 mEq/L — ABNORMAL LOW (ref 137–147)

## 2013-08-19 MED ORDER — BISACODYL 10 MG RE SUPP
10.0000 mg | Freq: Once | RECTAL | Status: AC
  Administered 2013-08-19: 10 mg via RECTAL
  Filled 2013-08-19: qty 1
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
## 2013-08-19 NOTE — Progress Notes (Signed)
UR completed.  Shaydon Lease, RN BSN MHA CCM Trauma/Neuro ICU Case Manager [phone]  

## 2013-08-19 NOTE — Progress Notes (Signed)
Patient examined and I agree with the assessment and plan  Georganna Skeans, MD, MPH, FACS Pager: [phone]  08/19/2013 2:54 PM

## 2013-08-19 NOTE — Clinical Social Work Note (Signed)
Clinical Social Worker continuing to follow patient for support and discharge planning needs.  Patient has been cleared by Psychiatry and at this time does not fully meet criteria for SNF placement.  CSW has submitted referral to [facility] and will initiate SNF search pending patient wound care needs on Monday.  CSW updated patient at bedside and will continue to remain available for support and to facilitate patient discharge needs once medically stable.  Barbette Or, [place]

## 2013-08-19 NOTE — Progress Notes (Signed)
Chaplain received a consult request. Patient eating lunch. Healthcare provider was present. Chaplain offered ministry of presence and emotional support. Patient said "he is homeless and does not know what he is going to do once discharged from hospital. Patient said he will reach out to people he knows. Chaplain offered spiritual support through prayer.   08/19/13 1400  Clinical Encounter Type  Visited With Patient;Health care provider  Visit Type Follow-up  Referral From Physician  Spiritual Encounters  Spiritual Needs Emotional  Stress Factors  Patient Stress Factors Major life changes

## 2013-08-20 NOTE — Progress Notes (Signed)
Orthopedic Tech Progress Note Patient Details:  Edwin [name] May 03, 1956 [id number]  Ortho Devices Type of Ortho Device: Wrist splint Ortho Device/Splint Interventions: Application   [name], [name] 08/20/2013, 2:33 PM
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
## 2013-08-21 MED ORDER — HYDROMORPHONE HCL PF 1 MG/ML IJ SOLN
1.0000 mg | Freq: Two times a day (BID) | INTRAMUSCULAR | Status: DC | PRN
  Administered 2013-08-21 – 2013-08-22 (×3): 1 mg via INTRAVENOUS
  Filled 2013-08-21 (×3): qty 1
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
## 2013-08-22 DIAGNOSIS — I739 Peripheral vascular disease, unspecified: Secondary | ICD-10-CM | POA: Diagnosis present

## 2013-08-22 DIAGNOSIS — L97909 Non-pressure chronic ulcer of unspecified part of unspecified lower leg with unspecified severity: Secondary | ICD-10-CM

## 2013-08-22 DIAGNOSIS — J45909 Unspecified asthma, uncomplicated: Secondary | ICD-10-CM | POA: Insufficient documentation

## 2013-08-22 DIAGNOSIS — L97919 Non-pressure chronic ulcer of unspecified part of right lower leg with unspecified severity: Secondary | ICD-10-CM | POA: Diagnosis present

## 2013-08-22 DIAGNOSIS — S1093XA Contusion of unspecified part of neck, initial encounter: Secondary | ICD-10-CM | POA: Diagnosis present

## 2013-08-22 DIAGNOSIS — D62 Acute posthemorrhagic anemia: Secondary | ICD-10-CM | POA: Diagnosis not present

## 2013-08-22 DIAGNOSIS — T1491XA Suicide attempt, initial encounter: Secondary | ICD-10-CM | POA: Diagnosis present

## 2013-08-22 DIAGNOSIS — G8929 Other chronic pain: Secondary | ICD-10-CM | POA: Diagnosis present

## 2013-08-22 MED ORDER — DAKINS (1/2 STRENGTH) 0.25 % EX SOLN: Freq: Once | CUTANEOUS | Status: DC

## 2013-08-22 MED ORDER — POTASSIUM CHLORIDE CRYS ER 20 MEQ PO TBCR: 40.0000 meq | EXTENDED_RELEASE_TABLET | Freq: Two times a day (BID) | ORAL | Status: DC

## 2013-08-22 MED ORDER — ASPIRIN 325 MG PO TBEC: 325.0000 mg | DELAYED_RELEASE_TABLET | Freq: Every day | ORAL | Status: DC

## 2013-08-22 MED ORDER — TRAMADOL HCL 50 MG PO TABS
100.0000 mg | ORAL_TABLET | Freq: Four times a day (QID) | ORAL | Status: DC
  Administered 2013-08-22 (×2): 100 mg via ORAL
  Filled 2013-08-22 (×2): qty 2

## 2013-08-22 MED ORDER — DAKINS (1/2 STRENGTH) 0.25 % EX SOLN
Freq: Once | CUTANEOUS | Status: DC
  Filled 2013-08-22: qty 473

## 2013-08-22 MED ORDER — TRAMADOL HCL 50 MG PO TABS: 100.0000 mg | ORAL_TABLET | Freq: Four times a day (QID) | ORAL | Status: DC

## 2013-08-22 MED ORDER — FUROSEMIDE 40 MG PO TABS: 40.0000 mg | ORAL_TABLET | Freq: Two times a day (BID) | ORAL | Status: DC

## 2013-08-22 MED ORDER — IPRATROPIUM-ALBUTEROL 0.5-2.5 (3) MG/3ML IN SOLN: 3.0000 mL | RESPIRATORY_TRACT | Status: DC | PRN

## 2013-08-22 MED ORDER — LISINOPRIL 20 MG PO TABS: 20.0000 mg | ORAL_TABLET | Freq: Every day | ORAL | Status: DC

## 2013-08-22 MED ORDER — NAPROXEN 500 MG PO TABS: 500.0000 mg | ORAL_TABLET | Freq: Two times a day (BID) | ORAL | Status: DC

## 2013-08-22 MED ORDER — OXYCODONE HCL 5 MG PO TABS
10.0000 mg | ORAL_TABLET | ORAL | Status: DC | PRN
  Administered 2013-08-22 (×2): 20 mg via ORAL
  Filled 2013-08-22 (×2): qty 4

## 2013-08-22 MED ORDER — NAPROXEN 500 MG PO TABS
500.0000 mg | ORAL_TABLET | Freq: Two times a day (BID) | ORAL | Status: DC
  Administered 2013-08-22 (×2): 500 mg via ORAL
  Filled 2013-08-22 (×3): qty 1

## 2013-08-22 MED ORDER — OXYCODONE HCL 10 MG PO TABS: 10.0000 mg | ORAL_TABLET | ORAL | Status: DC | PRN

## 2013-08-22 MED ORDER — DAKINS (1/2 STRENGTH) 0.25 % EX SOLN: Freq: Two times a day (BID) | CUTANEOUS | Status: DC

## 2013-08-22 MED ORDER — OXYCODONE HCL 5 MG PO TABS
10.0000 mg | ORAL_TABLET | Freq: Once | ORAL | Status: AC
  Administered 2013-08-22: 10 mg via ORAL
  Filled 2013-08-22: qty 2

## 2013-08-22 NOTE — Clinical Social Work Note (Signed)
Clinical Social Worker continuing to follow patient for support and discharge planning needs.  Patient has qualified for [employer] and is set up for discharge follow up beginning tomorrow.  Patient has been assigned Hotel manager ([facility]regational RN) and Kyra Searles ([facility] SW) for follow up care.  Patient is agreeable with plan and has been given all necessary items for discharge.  CSW to provide patient with clothes for discharge and his daughter will bring the remainder of his belongings.  Patient verbalized his appreciation for CSW support and concern.  Clinical Social Worker will sign off for now as social work intervention is no longer needed. Please consult [place] again if new need arises.  Barbette Or, [place]
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
## 2013-08-22 NOTE — Discharge Planning (Signed)
Patient discharged to hotel in stable condition. Verbalizes understanding of all discharge instructions, including home medications, dressing change instructions and follow up appointments.

## 2013-08-22 NOTE — Progress Notes (Signed)
OT making a new splint. Working on [employer] placement. Patient examined and I agree with the assessment and plan  Georganna Skeans, MD, MPH, FACS Pager: [phone]  08/22/2013 2:42 PM

## 2013-08-22 NOTE — Discharge Instructions (Signed)
Treat wound according to nursing instructions.
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
## 2013-08-23 ENCOUNTER — Encounter (HOSPITAL_COMMUNITY): Payer: Self-pay | Admitting: Emergency Medicine
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
## 2013-08-23 NOTE — Discharge Summary (Signed)
Parley Pidcock, MD, MPH, FACS Pager: [phone]  

## 2013-08-29 ENCOUNTER — Telehealth (INDEPENDENT_AMBULATORY_CARE_PROVIDER_SITE_OTHER): Payer: Self-pay

## 2013-08-29 NOTE — Telephone Encounter (Signed)
Danielle/ADV states patient refused home care visit today  # [phone] ext [phone]

## 2013-09-07 ENCOUNTER — Inpatient Hospital Stay (HOSPITAL_COMMUNITY)
Admission: EM | Admit: 2013-09-07 | Discharge: 2013-09-21 | DRG: 853 | Disposition: A | Discharge status: Skilled Nursing Facility | Payer: Medicaid Other | Attending: Internal Medicine | Admitting: Internal Medicine

## 2013-09-07 ENCOUNTER — Inpatient Hospital Stay (HOSPITAL_COMMUNITY): Payer: Medicaid Other

## 2013-09-07 ENCOUNTER — Emergency Department (HOSPITAL_COMMUNITY): Payer: Medicaid Other

## 2013-09-07 ENCOUNTER — Encounter: Payer: Self-pay | Admitting: Vascular Surgery

## 2013-09-07 DIAGNOSIS — F1020 Alcohol dependence, uncomplicated: Secondary | ICD-10-CM

## 2013-09-07 DIAGNOSIS — Z5900 Homelessness unspecified: Secondary | ICD-10-CM

## 2013-09-07 DIAGNOSIS — N529 Male erectile dysfunction, unspecified: Secondary | ICD-10-CM | POA: Diagnosis present

## 2013-09-07 DIAGNOSIS — S1191XA Laceration without foreign body of unspecified part of neck, initial encounter: Secondary | ICD-10-CM

## 2013-09-07 DIAGNOSIS — Z8249 Family history of ischemic heart disease and other diseases of the circulatory system: Secondary | ICD-10-CM

## 2013-09-07 DIAGNOSIS — E872 Acidosis, unspecified: Secondary | ICD-10-CM

## 2013-09-07 DIAGNOSIS — F411 Generalized anxiety disorder: Secondary | ICD-10-CM

## 2013-09-07 DIAGNOSIS — L89150 Pressure ulcer of sacral region, unstageable: Secondary | ICD-10-CM | POA: Diagnosis present

## 2013-09-07 DIAGNOSIS — L97509 Non-pressure chronic ulcer of other part of unspecified foot with unspecified severity: Secondary | ICD-10-CM | POA: Diagnosis present

## 2013-09-07 DIAGNOSIS — E871 Hypo-osmolality and hyponatremia: Secondary | ICD-10-CM | POA: Diagnosis present

## 2013-09-07 DIAGNOSIS — Z79899 Other long term (current) drug therapy: Secondary | ICD-10-CM

## 2013-09-07 DIAGNOSIS — G8929 Other chronic pain: Secondary | ICD-10-CM

## 2013-09-07 DIAGNOSIS — E875 Hyperkalemia: Secondary | ICD-10-CM | POA: Diagnosis present

## 2013-09-07 DIAGNOSIS — S2239XA Fracture of one rib, unspecified side, initial encounter for closed fracture: Secondary | ICD-10-CM | POA: Diagnosis present

## 2013-09-07 DIAGNOSIS — K560 Paralytic ileus: Secondary | ICD-10-CM | POA: Diagnosis not present

## 2013-09-07 DIAGNOSIS — K319 Disease of stomach and duodenum, unspecified: Secondary | ICD-10-CM | POA: Diagnosis present

## 2013-09-07 DIAGNOSIS — R6520 Severe sepsis without septic shock: Secondary | ICD-10-CM

## 2013-09-07 DIAGNOSIS — E861 Hypovolemia: Secondary | ICD-10-CM | POA: Diagnosis not present

## 2013-09-07 DIAGNOSIS — I509 Heart failure, unspecified: Secondary | ICD-10-CM | POA: Diagnosis present

## 2013-09-07 DIAGNOSIS — L89109 Pressure ulcer of unspecified part of back, unspecified stage: Secondary | ICD-10-CM | POA: Diagnosis present

## 2013-09-07 DIAGNOSIS — I10 Essential (primary) hypertension: Secondary | ICD-10-CM | POA: Diagnosis present

## 2013-09-07 DIAGNOSIS — A419 Sepsis, unspecified organism: Principal | ICD-10-CM | POA: Diagnosis present

## 2013-09-07 DIAGNOSIS — L89309 Pressure ulcer of unspecified buttock, unspecified stage: Secondary | ICD-10-CM | POA: Diagnosis present

## 2013-09-07 DIAGNOSIS — I4891 Unspecified atrial fibrillation: Secondary | ICD-10-CM | POA: Diagnosis not present

## 2013-09-07 DIAGNOSIS — L97919 Non-pressure chronic ulcer of unspecified part of right lower leg with unspecified severity: Secondary | ICD-10-CM

## 2013-09-07 DIAGNOSIS — R17 Unspecified jaundice: Secondary | ICD-10-CM | POA: Diagnosis present

## 2013-09-07 DIAGNOSIS — N179 Acute kidney failure, unspecified: Secondary | ICD-10-CM | POA: Diagnosis present

## 2013-09-07 DIAGNOSIS — I70269 Atherosclerosis of native arteries of extremities with gangrene, unspecified extremity: Secondary | ICD-10-CM | POA: Diagnosis present

## 2013-09-07 DIAGNOSIS — R5381 Other malaise: Secondary | ICD-10-CM | POA: Diagnosis present

## 2013-09-07 DIAGNOSIS — K219 Gastro-esophageal reflux disease without esophagitis: Secondary | ICD-10-CM | POA: Diagnosis present

## 2013-09-07 DIAGNOSIS — M6282 Rhabdomyolysis: Secondary | ICD-10-CM | POA: Diagnosis present

## 2013-09-07 DIAGNOSIS — F528 Other sexual dysfunction not due to a substance or known physiological condition: Secondary | ICD-10-CM

## 2013-09-07 DIAGNOSIS — I70209 Unspecified atherosclerosis of native arteries of extremities, unspecified extremity: Secondary | ICD-10-CM

## 2013-09-07 DIAGNOSIS — G9349 Other encephalopathy: Secondary | ICD-10-CM | POA: Diagnosis present

## 2013-09-07 DIAGNOSIS — K259 Gastric ulcer, unspecified as acute or chronic, without hemorrhage or perforation: Secondary | ICD-10-CM | POA: Diagnosis present

## 2013-09-07 DIAGNOSIS — I96 Gangrene, not elsewhere classified: Secondary | ICD-10-CM

## 2013-09-07 DIAGNOSIS — I701 Atherosclerosis of renal artery: Secondary | ICD-10-CM

## 2013-09-07 DIAGNOSIS — J45909 Unspecified asthma, uncomplicated: Secondary | ICD-10-CM

## 2013-09-07 DIAGNOSIS — I4949 Other premature depolarization: Secondary | ICD-10-CM | POA: Diagnosis present

## 2013-09-07 DIAGNOSIS — R4181 Age-related cognitive decline: Secondary | ICD-10-CM | POA: Diagnosis present

## 2013-09-07 DIAGNOSIS — F1010 Alcohol abuse, uncomplicated: Secondary | ICD-10-CM | POA: Diagnosis present

## 2013-09-07 DIAGNOSIS — Z720 Tobacco use: Secondary | ICD-10-CM

## 2013-09-07 DIAGNOSIS — I4510 Unspecified right bundle-branch block: Secondary | ICD-10-CM | POA: Diagnosis present

## 2013-09-07 DIAGNOSIS — E162 Hypoglycemia, unspecified: Secondary | ICD-10-CM | POA: Diagnosis not present

## 2013-09-07 DIAGNOSIS — K3184 Gastroparesis: Secondary | ICD-10-CM | POA: Diagnosis not present

## 2013-09-07 DIAGNOSIS — Z566 Other physical and mental strain related to work: Secondary | ICD-10-CM

## 2013-09-07 DIAGNOSIS — T1491XA Suicide attempt, initial encounter: Secondary | ICD-10-CM

## 2013-09-07 DIAGNOSIS — L8995 Pressure ulcer of unspecified site, unstageable: Secondary | ICD-10-CM | POA: Diagnosis present

## 2013-09-07 DIAGNOSIS — R34 Anuria and oliguria: Secondary | ICD-10-CM | POA: Diagnosis not present

## 2013-09-07 DIAGNOSIS — E874 Mixed disorder of acid-base balance: Secondary | ICD-10-CM | POA: Diagnosis present

## 2013-09-07 DIAGNOSIS — R0989 Other specified symptoms and signs involving the circulatory and respiratory systems: Secondary | ICD-10-CM

## 2013-09-07 DIAGNOSIS — R6521 Severe sepsis with septic shock: Secondary | ICD-10-CM

## 2013-09-07 DIAGNOSIS — R279 Unspecified lack of coordination: Secondary | ICD-10-CM | POA: Diagnosis present

## 2013-09-07 DIAGNOSIS — S1093XA Contusion of unspecified part of neck, initial encounter: Secondary | ICD-10-CM

## 2013-09-07 DIAGNOSIS — F17200 Nicotine dependence, unspecified, uncomplicated: Secondary | ICD-10-CM | POA: Diagnosis present

## 2013-09-07 DIAGNOSIS — I2510 Atherosclerotic heart disease of native coronary artery without angina pectoris: Secondary | ICD-10-CM | POA: Diagnosis present

## 2013-09-07 DIAGNOSIS — E876 Hypokalemia: Secondary | ICD-10-CM | POA: Diagnosis not present

## 2013-09-07 DIAGNOSIS — Z681 Body mass index (BMI) 19 or less, adult: Secondary | ICD-10-CM

## 2013-09-07 DIAGNOSIS — G4700 Insomnia, unspecified: Secondary | ICD-10-CM

## 2013-09-07 DIAGNOSIS — L98499 Non-pressure chronic ulcer of skin of other sites with unspecified severity: Secondary | ICD-10-CM

## 2013-09-07 DIAGNOSIS — R636 Underweight: Secondary | ICD-10-CM | POA: Diagnosis present

## 2013-09-07 DIAGNOSIS — Y835 Amputation of limb(s) as the cause of abnormal reaction of the patient, or of later complication, without mention of misadventure at the time of the procedure: Secondary | ICD-10-CM | POA: Diagnosis not present

## 2013-09-07 DIAGNOSIS — K929 Disease of digestive system, unspecified: Secondary | ICD-10-CM | POA: Diagnosis not present

## 2013-09-07 DIAGNOSIS — E785 Hyperlipidemia, unspecified: Secondary | ICD-10-CM

## 2013-09-07 DIAGNOSIS — J4489 Other specified chronic obstructive pulmonary disease: Secondary | ICD-10-CM | POA: Diagnosis present

## 2013-09-07 DIAGNOSIS — I498 Other specified cardiac arrhythmias: Secondary | ICD-10-CM | POA: Diagnosis not present

## 2013-09-07 DIAGNOSIS — D696 Thrombocytopenia, unspecified: Secondary | ICD-10-CM | POA: Diagnosis not present

## 2013-09-07 DIAGNOSIS — D62 Acute posthemorrhagic anemia: Secondary | ICD-10-CM | POA: Diagnosis not present

## 2013-09-07 DIAGNOSIS — Y9210 Unspecified residential institution as the place of occurrence of the external cause: Secondary | ICD-10-CM | POA: Diagnosis not present

## 2013-09-07 DIAGNOSIS — N19 Unspecified kidney failure: Secondary | ICD-10-CM

## 2013-09-07 DIAGNOSIS — R64 Cachexia: Secondary | ICD-10-CM | POA: Diagnosis present

## 2013-09-07 DIAGNOSIS — I739 Peripheral vascular disease, unspecified: Secondary | ICD-10-CM

## 2013-09-07 DIAGNOSIS — J449 Chronic obstructive pulmonary disease, unspecified: Secondary | ICD-10-CM

## 2013-09-07 DIAGNOSIS — D500 Iron deficiency anemia secondary to blood loss (chronic): Secondary | ICD-10-CM | POA: Diagnosis present

## 2013-09-07 DIAGNOSIS — E43 Unspecified severe protein-calorie malnutrition: Secondary | ICD-10-CM | POA: Diagnosis present

## 2013-09-07 DIAGNOSIS — K921 Melena: Secondary | ICD-10-CM | POA: Diagnosis not present

## 2013-09-07 DIAGNOSIS — M21339 Wrist drop, unspecified wrist: Secondary | ICD-10-CM | POA: Diagnosis present

## 2013-09-07 DIAGNOSIS — Z888 Allergy status to other drugs, medicaments and biological substances status: Secondary | ICD-10-CM

## 2013-09-07 DIAGNOSIS — J9600 Acute respiratory failure, unspecified whether with hypoxia or hypercapnia: Secondary | ICD-10-CM | POA: Diagnosis not present

## 2013-09-07 LAB — LIPASE, BLOOD: Lipase: 127 U/L — ABNORMAL HIGH (ref 11–59)

## 2013-09-07 LAB — URINALYSIS, ROUTINE W REFLEX MICROSCOPIC
GLUCOSE, UA: NEGATIVE mg/dL
Hgb urine dipstick: NEGATIVE
Ketones, ur: 15 mg/dL — AB
NITRITE: NEGATIVE
Protein, ur: 30 mg/dL — AB
Specific Gravity, Urine: 1.019 (ref 1.005–1.030)
UROBILINOGEN UA: 0.2 mg/dL (ref 0.0–1.0)
pH: 5 (ref 5.0–8.0)

## 2013-09-07 LAB — CBC WITH DIFFERENTIAL/PLATELET
BASOS ABS: 0 10*3/uL (ref 0.0–0.1)
Basophils Relative: 0 % (ref 0–1)
EOS ABS: 0 10*3/uL (ref 0.0–0.7)
Eosinophils Relative: 0 % (ref 0–5)
HCT: 23.3 % — ABNORMAL LOW (ref 39.0–52.0)
Hemoglobin: 8 g/dL — ABNORMAL LOW (ref 13.0–17.0)
LYMPHS ABS: 0.7 10*3/uL (ref 0.7–4.0)
Lymphocytes Relative: 2 % — ABNORMAL LOW (ref 12–46)
MCH: 31.9 pg (ref 26.0–34.0)
MCHC: 34.3 g/dL (ref 30.0–36.0)
MCV: 92.8 fL (ref 78.0–100.0)
Monocytes Absolute: 2 10*3/uL — ABNORMAL HIGH (ref 0.1–1.0)
Monocytes Relative: 6 % (ref 3–12)
Neutro Abs: 30.8 10*3/uL — ABNORMAL HIGH (ref 1.7–7.7)
Neutrophils Relative %: 92 % — ABNORMAL HIGH (ref 43–77)
PLATELETS: 529 10*3/uL — AB (ref 150–400)
RBC: 2.51 MIL/uL — ABNORMAL LOW (ref 4.22–5.81)
RDW: 15.7 % — AB (ref 11.5–15.5)
WBC: 33.5 10*3/uL — ABNORMAL HIGH (ref 4.0–10.5)

## 2013-09-07 LAB — POCT I-STAT 3, ART BLOOD GAS (G3+)
Acid-base deficit: 5 mmol/L — ABNORMAL HIGH (ref 0.0–2.0)
Bicarbonate: 18.9 mEq/L — ABNORMAL LOW (ref 20.0–24.0)
O2 SAT: 98 %
PCO2 ART: 30 mmHg — AB (ref 35.0–45.0)
PO2 ART: 97 mmHg (ref 80.0–100.0)
Patient temperature: 98.6
TCO2: 20 mmol/L (ref 0–100)
pH, Arterial: 7.406 (ref 7.350–7.450)

## 2013-09-07 LAB — URINE MICROSCOPIC-ADD ON

## 2013-09-07 LAB — COMPREHENSIVE METABOLIC PANEL
ALT: 17 U/L (ref 0–53)
AST: 32 U/L (ref 0–37)
Albumin: 2.5 g/dL — ABNORMAL LOW (ref 3.5–5.2)
Alkaline Phosphatase: 140 U/L — ABNORMAL HIGH (ref 39–117)
BILIRUBIN TOTAL: 0.6 mg/dL (ref 0.3–1.2)
BUN: 106 mg/dL — ABNORMAL HIGH (ref 6–23)
CHLORIDE: 81 meq/L — AB (ref 96–112)
CO2: 14 mEq/L — ABNORMAL LOW (ref 19–32)
CREATININE: 5.75 mg/dL — AB (ref 0.50–1.35)
Calcium: 9.6 mg/dL (ref 8.4–10.5)
GFR calc Af Amer: 11 mL/min — ABNORMAL LOW (ref >90)
GFR calc non Af Amer: 10 mL/min — ABNORMAL LOW (ref >90)
Glucose, Bld: 92 mg/dL (ref 70–99)
Potassium: >7.7 mEq/L (ref 3.7–5.3)
Sodium: 124 mEq/L — ABNORMAL LOW (ref 137–147)
TOTAL PROTEIN: 7.4 g/dL (ref 6.0–8.3)

## 2013-09-07 LAB — POCT I-STAT TROPONIN I: TROPONIN I, POC: 0 ng/mL (ref 0.00–0.08)

## 2013-09-07 LAB — GLUCOSE, CAPILLARY: Glucose-Capillary: <10 mg/dL — CL (ref 70–99)

## 2013-09-07 LAB — CG4 I-STAT (LACTIC ACID): LACTIC ACID, VENOUS: 6.11 mmol/L — AB (ref 0.5–2.2)

## 2013-09-07 MED ORDER — DEXTROSE 50 % IV SOLN
50.0000 mL | Freq: Once | INTRAVENOUS | Status: AC
  Administered 2013-09-07: 50 mL via INTRAVENOUS

## 2013-09-07 MED ORDER — SODIUM BICARBONATE 8.4 % IV SOLN
INTRAVENOUS | Status: DC
  Administered 2013-09-08: 01:00:00 via INTRAVENOUS
  Filled 2013-09-07: qty 150

## 2013-09-07 MED ORDER — SODIUM CHLORIDE 0.9 % IV SOLN
INTRAVENOUS | Status: DC
  Administered 2013-09-08: via INTRAVENOUS

## 2013-09-07 MED ORDER — IOHEXOL 300 MG/ML  SOLN
25.0000 mL | Freq: Once | INTRAMUSCULAR | Status: AC | PRN
  Administered 2013-09-07: 25 mL via ORAL

## 2013-09-07 MED ORDER — PIPERACILLIN-TAZOBACTAM 3.375 G IVPB 30 MIN
3.3750 g | Freq: Once | INTRAVENOUS | Status: AC
  Administered 2013-09-07: 3.375 g via INTRAVENOUS
  Filled 2013-09-07: qty 50

## 2013-09-07 MED ORDER — HEPARIN SODIUM (PORCINE) 5000 UNIT/ML IJ SOLN
5000.0000 [IU] | Freq: Three times a day (TID) | INTRAMUSCULAR | Status: DC
  Administered 2013-09-08: 5000 [IU] via SUBCUTANEOUS
  Filled 2013-09-07 (×4): qty 1

## 2013-09-07 MED ORDER — VANCOMYCIN HCL IN DEXTROSE 1-5 GM/200ML-% IV SOLN
1000.0000 mg | Freq: Once | INTRAVENOUS | Status: AC
  Administered 2013-09-07: 1000 mg via INTRAVENOUS
  Filled 2013-09-07: qty 200

## 2013-09-07 MED ORDER — SODIUM POLYSTYRENE SULFONATE 15 GM/60ML PO SUSP
30.0000 g | Freq: Once | ORAL | Status: AC
  Administered 2013-09-08: 30 g via ORAL
  Filled 2013-09-07: qty 120

## 2013-09-07 MED ORDER — PIPERACILLIN-TAZOBACTAM IN DEX 2-0.25 GM/50ML IV SOLN
2.2500 g | Freq: Four times a day (QID) | INTRAVENOUS | Status: DC
  Administered 2013-09-08: 2.25 g via INTRAVENOUS
  Filled 2013-09-07 (×2): qty 50

## 2013-09-07 MED ORDER — HYDROMORPHONE HCL PF 1 MG/ML IJ SOLN: 1.0000 mg | Freq: Once | INTRAMUSCULAR | Status: DC

## 2013-09-07 MED ORDER — SODIUM BICARBONATE 8.4 % IV SOLN
50.0000 meq | Freq: Once | INTRAVENOUS | Status: AC
  Administered 2013-09-07: 50 meq via INTRAVENOUS

## 2013-09-07 MED ORDER — SODIUM CHLORIDE 0.9 % IV BOLUS (SEPSIS)
1000.0000 mL | Freq: Once | INTRAVENOUS | Status: AC
  Administered 2013-09-08: 1000 mL via INTRAVENOUS

## 2013-09-07 MED ORDER — SODIUM CHLORIDE 0.9 % IV SOLN: 250.0000 mL | INTRAVENOUS | Status: DC | PRN

## 2013-09-07 MED ORDER — SODIUM CHLORIDE 0.9 % IV BOLUS (SEPSIS)
1000.0000 mL | Freq: Once | INTRAVENOUS | Status: AC
  Administered 2013-09-07: 1000 mL via INTRAVENOUS

## 2013-09-07 MED ORDER — ALBUTEROL SULFATE (2.5 MG/3ML) 0.083% IN NEBU: 2.5000 mg | INHALATION_SOLUTION | RESPIRATORY_TRACT | Status: DC | PRN

## 2013-09-07 MED ORDER — FENTANYL CITRATE 0.05 MG/ML IJ SOLN
50.0000 ug | Freq: Once | INTRAMUSCULAR | Status: AC
  Administered 2013-09-07: 50 ug via INTRAVENOUS
  Filled 2013-09-07: qty 2

## 2013-09-07 MED ORDER — PANTOPRAZOLE SODIUM 40 MG IV SOLR
40.0000 mg | Freq: Every day | INTRAVENOUS | Status: DC
  Administered 2013-09-07 – 2013-09-08 (×2): 40 mg via INTRAVENOUS
  Filled 2013-09-07 (×3): qty 40

## 2013-09-07 NOTE — H&P (Signed)
Name: Edwin [name] MRN: [id number] DOB: 01/14/1956    ADMISSION DATE:  09/07/2013   PRIMARY SERVICE: pccm   CHIEF COMPLAINT:  Sepsis   BRIEF PATIENT DESCRIPTION:  58 year old male w/ severe PAD. Admitted on 2/12 w/ severe sepsis/ septic shock, acute renal failure, metabolic acidosis and hyperkalemia in setting of gangrenous non-healing RLE ulcer.   SIGNIFICANT EVENTS / STUDIES:  Right Tib/Fib 2/12>>> Right ankle 2/12>>>  LINES / TUBES:   CULTURES: BCX2 2/12>>> UC 2/12>>>  ANTIBIOTICS: vanc 2/12>>> Zosyn 2/12>>> clinda 2/12>>>  HISTORY OF PRESENT ILLNESS:   58 year old w/ known h/o severe PAD and non-healing ulcer of right LE, had recent right arthrectomy for known severe PAD of right SFA (aug 2014). Since followed at wound clinic, and dressing changed by daughter. Daughter notes that he has had episodes of confusion and hallucinations over the last few days, and recently noted that the posterior aspect of his LE wound has had more discharge. PT presented to ER 2/12 w/ CC Shortness of breath. CXR was clear. Wound on RLE gangrenous w/ fouls smelling d/c, dx eval showed acute renal failure, severe metabolic acidosis and hyperkalemia. Developed widened QRS in ED, required emergent rx. PCCM asked to see given concern for sepsis.   PAST MEDICAL HISTORY :  Past Medical History  Diagnosis Date  . COPD 11/27/2007    Qualifier: Diagnosis of  By: Garen Grams    . ERECTILE DYSFUNCTION 11/27/2007    Qualifier: Diagnosis of  By: Garen Grams    . HYPERLIPIDEMIA 11/27/2007    Qualifier: Diagnosis of  By: Garen Grams    . HYPERTENSION, BENIGN 05/23/2009    Qualifier: Diagnosis of  By: Melvyn Novas MD, Christena Deem   . Coronary artery disease     Cardiac catheterization in 2008 showed 99% stenosis in proximal left circumflex which was treated with Taxus drug-eluting stent. There was mild RCA and LAD disease with normal ejection fraction.  Marland Kitchen PAD (peripheral artery disease)    Previous right SFA stent in 2003. Directional atherectomy right SFA in 01/2013  . Tobacco use   . CHF (congestive heart failure)   . Hypertension   . Asthma    Past Surgical History  Procedure Laterality Date  . Nasal sinus surgery  2004  . Acne cyst removal    . Other surgical history      blocked artery  . Endarterectomy Left 08/16/2013    Procedure: Exploration of Left Neck/Fix Bleeding;  Surgeon: Elam Dutch, MD;  Location: [place];  Service: Vascular;  Laterality: Left;   Prior to Admission medications   Medication Sig Start Date End Date Taking? Authorizing Provider  collagenase (SANTYL) ointment Apply 1 application topically daily.   Yes Historical Provider, MD  furosemide (LASIX) 40 MG tablet Take 1 tablet (40 mg total) by mouth 2 (two) times daily. 08/22/13  Yes Lisette Abu, PA-C  HYDROcodone-acetaminophen (NORCO/VICODIN) 5-325 MG per tablet Take 1 tablet by mouth every 6 (six) hours as needed.  03/16/13  Yes Historical Provider, MD  lisinopril (PRINIVIL,ZESTRIL) 20 MG tablet Take 1 tablet (20 mg total) by mouth daily. 08/22/13  Yes Lisette Abu, PA-C  mirtazapine (REMERON SOL-TAB) 15 MG disintegrating tablet Take 15 mg by mouth at bedtime.   Yes Historical Provider, MD  naproxen (NAPROSYN) 500 MG tablet Take 500 mg by mouth 2 (two) times daily with a meal.   Yes Historical Provider, MD  oxyCODONE 10 MG TABS Take 1-2 tablets (10-20  mg total) by mouth every 4 (four) hours as needed for moderate pain. 08/22/13  Yes Lisette Abu, PA-C  potassium chloride SA (K-DUR,KLOR-CON) 20 MEQ tablet Take 2 tablets (40 mEq total) by mouth 2 (two) times daily. 08/22/13  Yes Lisette Abu, PA-C  sodium hypochlorite (DAKIN'S 1/2 STRENGTH) external solution Irrigate with as directed 2 (two) times daily. 08/22/13  Yes Lisette Abu, PA-C  traMADol (ULTRAM) 50 MG tablet Take 50-100 mg by mouth every 8 (eight) hours as needed for moderate pain.   Yes Historical Provider, MD    Allergies  Allergen Reactions  . Codeine Nausea Only    FAMILY HISTORY:  Family History  Problem Relation Age of Onset  . Adopted: Yes  . Heart disease Maternal Grandfather   . Heart disease Paternal Grandfather    SOCIAL HISTORY:  reports that he has been smoking.  He does not have any smokeless tobacco history on file. He reports that he drinks about 14.4 ounces of alcohol per week. He reports that he uses illicit drugs.  Review of Systems:   Bolds are positive  Constitutional: weight loss, gain, night sweats, Fevers, chills, fatigue .  HEENT: headaches, Sore throat, sneezing, nasal congestion, post nasal drip, Difficulty swallowing, Tooth/dental problems, visual complaints visual changes, ear ache CV:  chest pain, radiates: ,Orthopnea, PND, swelling in lower extremities, dizziness, palpitations, syncope.  GI  heartburn, indigestion, abdominal pain, nausea, vomiting, diarrhea, change in bowel habits, loss of appetite, bloody stools.  Resp: cough, productive: , hemoptysis, dyspnea, chest pain, pleuritic.  Skin: rash or itching or icterus GU: dysuria, change in color of urine, urgency or frequency. flank pain, hematuria  MS: joint pain or swelling. decreased range of motion right LE pain  Psych: change in mood or affect. depression or anxiety.  Neuro: difficulty with speech, weakness, numbness, ataxia   SUBJECTIVE:  C/o dyspnea  VITAL SIGNS: Temp:  [98 F (36.7 C)] 98 F (36.7 C) (02/11 2350) Pulse Rate:  [67-120] 92 (02/11 2350) Resp:  [18-30] 21 (02/11 2350) BP: (72-129)/(38-79) 92/49 mmHg (02/11 2350) SpO2:  [95 %-100 %] 96 % (02/11 2350) HEMODYNAMICS:   VENTILATOR SETTINGS:   INTAKE / OUTPUT: Intake/Output     02/11 0701 - 02/12 0700   I.V. 250   Total Intake 250   Net +250         PHYSICAL EXAMINATION: General:  Chronically ill appearing white male, no acute distress, but does have some dyspnea  Neuro:  Awake, alert, no focal def  HEENT:  Poor  dentition Cardiovascular:  rrr Lungs:  Clear, no accessory muscle use  Abdomen:  Soft, non-tender  Musculoskeletal:  Intact  Skin:  Non-stageable sacral and gluteal area wound, also large gangrenous non-healing wound on right lower extremity w/ foul smelling purulent  Discharge, as well as visible tendon on posterior aspect of wound   LABS:  CBC  Recent Labs Lab 09/07/13 2047  WBC 33.5*  HGB 8.0*  HCT 23.3*  PLT 529*   Coag's No results found for this basename: APTT, INR,  in the last 168 hours BMET  Recent Labs Lab 09/07/13 2047  NA 124*  K >7.7*  CL 81*  CO2 14*  BUN 106*  CREATININE 5.75*  GLUCOSE 92   Electrolytes  Recent Labs Lab 09/07/13 2047  CALCIUM 9.6   Sepsis Markers  Recent Labs Lab 09/07/13 2149  LATICACIDVEN 6.11*   ABG  Recent Labs Lab 09/07/13 2231  PHART 7.406  PCO2ART 30.0*  PO2ART  97.0   Liver Enzymes  Recent Labs Lab 09/07/13 2047  AST 32  ALT 17  ALKPHOS 140*  BILITOT 0.6  ALBUMIN 2.5*   Cardiac Enzymes No results found for this basename: TROPONINI, PROBNP,  in the last 168 hours Glucose  Recent Labs Lab 09/07/13 2236 09/07/13 2244  GLUCAP <10* <10*    Imaging Dg Chest Portable 1 View  09/07/2013   CLINICAL DATA:  Shortness of breath  EXAM: PORTABLE CHEST - 1 VIEW  COMPARISON:  August 05, 2012  FINDINGS: The heart size and mediastinal contours are within normal limits. There is no focal infiltrate, pulmonary edema, or pleural effusion. In the lateral left seventh rib, there is mild cortical discontinuity suspicious for acute fracture. Chronic posttraumatic change of the lateral left eighth and ninth ribs are noted.  IMPRESSION: No focal infiltrate identified. Fracture of the lateral left seventh rib.   Electronically Signed   By: Abelardo Diesel M.D.   On: 09/07/2013 21:28     CXR: clear   ASSESSMENT / PLAN:  PULMONARY A: COPD     Dyspnea in setting of acidosis  P:   PRN BIPAP -->pt felt it helped   Supplemental oxygen  F/u abg   CARDIOVASCULAR A: septic shock      Severe PAD, recent right arthrectomy for known severe PAD of right SFA (aug 2014) P:  Repeat lactic acid Consider Central access  MAP goal >65  RENAL A:   Acute renal failure: dehydration, sepsis, ace-I, and diuretics, NSAIDs + anion gap acidosis:sepsis, lactic acidosis  Hyperkalemia: acidosis and KCL supplementation  prob rhabdo P:   Place foley  NS bolus Ck total CKs Bicarb gtt  Hold antihypertensives, and diuretics Hold KCL  Give Kayexalate and repeat chemistry Renal consult  May need HD   GASTROINTESTINAL A:  No acute  P:   PPI   HEMATOLOGIC A:  Anemia. No evidence of bleeding. Likely AOCD P:  Trend CBC [place] heparin   INFECTIOUS A:  RLE gangrene of lower extremity       Severe sepsis  P:   LE films to r/o osteo Vascular has been called will see in am Vanc/zosyn/clinda Wound care consult   ENDOCRINE A:  No acute  P:   Trend glucose   NEUROLOGIC A:  Intermittent acute encephalopathy: in setting of sepsis  P:   Supportive care   TODAY'S SUMMARY: critically ill. Will admit to ICU. Needs broad spec abx. IV resuscitation. Renal and vascular called. Will prob need amputation.   I have personally obtained a history, examined the patient, evaluated laboratory and imaging results, formulated the assessment and plan and placed orders. CRITICAL CARE: The patient is critically ill with multiple organ systems failure and requires high complexity decision making for assessment and support, frequent evaluation and titration of therapies, application of advanced monitoring technologies and extensive interpretation of multiple databases. Critical Care Time devoted to patient care services described in this note is 60  minutes.    Pulmonary and [place] Pager: [phone]  09/08/2013, 12:19 AM  Reviewed above, examined pt and agree with assessment/plan.  58  yo male with chronic non-healing Rt leg ulcer with gangrene and sacral decubitus ulcer presents with sepsis, acute renal failure, hyperkalemia, and metabolic acidosis.  Appreciate help from renal.  VVS to f/u in AM.  Will continue abx, prn BiPAP.  Need to optimize nutrition.  Likely will need amputation of Rt lower extremity.  CC time 60 minutes.  Chesley Mires, MD [facility] Pulmonary/Critical Care 09/08/2013, 1:42 AM Pager:  [phone] After 3pm call: [phone]
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
## 2013-09-07 NOTE — Progress Notes (Signed)
ANTIBIOTIC CONSULT NOTE - INITIAL  Pharmacy Consult for Vancomycin and Zosyn Indication: rule out sepsis  Allergies  Allergen Reactions  . Codeine Nausea Only    Patient Measurements:  Weight: 73kg  Vital Signs: Temp src: Oral (02/11 2018) BP: 72/50 mmHg (02/11 2223) Pulse Rate: 104 (02/11 2223) Intake/Output from previous day:   Intake/Output from this shift:    Labs:  Recent Labs  09/07/13 2047  WBC 33.5*  HGB 8.0*  PLT 529*  CREATININE 5.75*   The CrCl is unknown because both a height and weight (above a minimum accepted value) are required for this calculation. No results found for this basename: VANCOTROUGH, Corlis Leak, VANCORANDOM, [place], GENTPEAK, GENTRANDOM, TOBRATROUGH, TOBRAPEAK, TOBRARND, AMIKACINPEAK, AMIKACINTROU, AMIKACIN,  in the last 72 hours   Microbiology: Recent Results (from the past 720 hour(s))  MRSA PCR SCREENING     Status: None   Collection Time    08/16/13  6:03 PM      Result Value Ref Range Status   MRSA by PCR NEGATIVE  NEGATIVE Final   Comment:            The GeneXpert MRSA Assay (FDA     approved for NASAL specimens     only), is one component of a     comprehensive MRSA colonization     surveillance program. It is not     intended to diagnose MRSA     infection nor to guide or     monitor treatment for     MRSA infections.    Medical History: Past Medical History  Diagnosis Date  . COPD 11/27/2007    Qualifier: Diagnosis of  By: Garen Grams    . ERECTILE DYSFUNCTION 11/27/2007    Qualifier: Diagnosis of  By: Garen Grams    . HYPERLIPIDEMIA 11/27/2007    Qualifier: Diagnosis of  By: Garen Grams    . HYPERTENSION, BENIGN 05/23/2009    Qualifier: Diagnosis of  By: Melvyn Novas MD, Christena Deem   . Coronary artery disease     Cardiac catheterization in 2008 showed 99% stenosis in proximal left circumflex which was treated with Taxus drug-eluting stent. There was mild RCA and LAD disease with normal ejection fraction.  Marland Kitchen  PAD (peripheral artery disease)     Previous right SFA stent in 2003. Directional atherectomy right SFA in 01/2013  . Tobacco use   . CHF (congestive heart failure)   . Hypertension   . Asthma     Medications:  See electronic Med Rec  Assessment: 60yom started on Vancomycin and Zosyn for empiric sepsis coverage. Patient received Vancomycin 1g and Zosyn 3.375g in ED ~2220.  Goal of Therapy:  Vancomycin trough level 15-20 mcg/ml  Plan:  1. No additional Vancomycin at this time - follow-up renal function to establish regimen 2. Zosyn 2.25g IV q6h 3. Monitor renal function, cultures, clinical course and order Vancomycin trough at [facility] [phone] 09/07/2013,10:51 PM

## 2013-09-07 NOTE — ED Notes (Signed)
EMS-pt reports gradual increase in sob over the last 4 days. Unable to obtain pulse ox on patient. Pt was placed on NRB en route and patient states he is feeling better. HR in the 80s. Denies chest pain. Pt with extensive hx. [facility] to [facility].
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
## 2013-09-07 NOTE — ED Provider Notes (Signed)
CSN: [id number]     Arrival date & time 09/07/13  2015 History   First MD Initiated Contact with Patient 09/07/13 2019     Chief Complaint  Patient presents with  . Shortness of Breath     (Consider location/radiation/quality/duration/timing/severity/associated sxs/prior Treatment) HPI Comments: Patient here from hotel where he lives with progressively worsening shortness of breath, RLQ abdominal pain with diarrhea and multiple decubitus ulcers.  He has no current PCP, continues to smoke.  He denies fever, chills, reports no appetite, chest pain, lower extremity swelling.  His daughter reports no po intake over the past several weeks and the chronic diarrhea.  She states that he does not ambulate at all.  Patient's history limited by his clinical presentation.  Patient is a 58 y.o. male presenting with shortness of breath. The history is provided by the patient. No language interpreter was used.  Shortness of Breath Severity:  Severe Onset quality:  Gradual Duration:  4 days Timing:  Constant Progression:  Worsening Chronicity:  New Context: activity   Relieved by:  Nothing Worsened by:  Nothing tried Ineffective treatments:  None tried Associated symptoms: abdominal pain and wheezing   Associated symptoms: no chest pain, no cough, no fever, no neck pain, no rash, no sputum production and no vomiting   Risk factors: tobacco use     Past Medical History  Diagnosis Date  . COPD 11/27/2007    Qualifier: Diagnosis of  By: Garen Grams    . ERECTILE DYSFUNCTION 11/27/2007    Qualifier: Diagnosis of  By: Garen Grams    . HYPERLIPIDEMIA 11/27/2007    Qualifier: Diagnosis of  By: Garen Grams    . HYPERTENSION, BENIGN 05/23/2009    Qualifier: Diagnosis of  By: Melvyn Novas MD, Christena Deem   . Coronary artery disease     Cardiac catheterization in 2008 showed 99% stenosis in proximal left circumflex which was treated with Taxus drug-eluting stent. There was mild RCA and LAD disease with  normal ejection fraction.  Marland Kitchen PAD (peripheral artery disease)     Previous right SFA stent in 2003. Directional atherectomy right SFA in 01/2013  . Tobacco use   . CHF (congestive heart failure)   . Hypertension   . Asthma    Past Surgical History  Procedure Laterality Date  . Nasal sinus surgery  2004  . Acne cyst removal    . Other surgical history      blocked artery  . Endarterectomy Left 08/16/2013    Procedure: Exploration of Left Neck/Fix Bleeding;  Surgeon: Elam Dutch, MD;  Location: [facility] OR;  Service: Vascular;  Laterality: Left;   Family History  Problem Relation Age of Onset  . Adopted: Yes  . Heart disease Maternal Grandfather   . Heart disease Paternal Grandfather    History  Substance Use Topics  . Smoking status: Current Every Day Smoker -- 1.50 packs/day for 40 years  . Smokeless tobacco: Not on file  . Alcohol Use: 14.4 oz/week    4 Cans of beer per week    Review of Systems  Unable to perform ROS: Acuity of condition  Constitutional: Negative for fever.  Respiratory: Positive for shortness of breath and wheezing. Negative for cough and sputum production.   Cardiovascular: Negative for chest pain.  Gastrointestinal: Positive for abdominal pain. Negative for vomiting.  Musculoskeletal: Negative for neck pain.  Skin: Negative for rash.      Allergies  Codeine  Home Medications   Current Outpatient Rx  Name  Route  Sig  Dispense  Refill  . collagenase (SANTYL) ointment   Topical   Apply 1 application topically daily.         . furosemide (LASIX) 40 MG tablet   Oral   Take 1 tablet (40 mg total) by mouth 2 (two) times daily.   68 tablet   0   . HYDROcodone-acetaminophen (NORCO/VICODIN) 5-325 MG per tablet   Oral   Take 1 tablet by mouth every 6 (six) hours as needed.          Marland Kitchen lisinopril (PRINIVIL,ZESTRIL) 20 MG tablet   Oral   Take 1 tablet (20 mg total) by mouth daily.   34 tablet   0   . mirtazapine (REMERON SOL-TAB) 15 MG  disintegrating tablet   Oral   Take 15 mg by mouth at bedtime.         . naproxen (NAPROSYN) 500 MG tablet   Oral   Take 500 mg by mouth 2 (two) times daily with a meal.         . oxyCODONE 10 MG TABS   Oral   Take 1-2 tablets (10-20 mg total) by mouth every 4 (four) hours as needed for moderate pain.   168 tablet   0   . potassium chloride SA (K-DUR,KLOR-CON) 20 MEQ tablet   Oral   Take 2 tablets (40 mEq total) by mouth 2 (two) times daily.   136 tablet   0   . sodium hypochlorite (DAKIN'S 1/2 STRENGTH) external solution   Irrigation   Irrigate with as directed 2 (two) times daily.   473 mL   1   . traMADol (ULTRAM) 50 MG tablet   Oral   Take 50-100 mg by mouth every 8 (eight) hours as needed for moderate pain.          BP 129/79  Pulse 84  Resp 30  SpO2 % Physical Exam  Nursing note and vitals reviewed. Constitutional: He is oriented to person, place, and time. He appears well-developed. He appears lethargic. He appears cachectic. He is cooperative. He appears toxic. Face mask in place.  HENT:  Head: Normocephalic and atraumatic.  Right Ear: External ear normal.  Left Ear: External ear normal.  Mouth/Throat: No oropharyngeal exudate.  Dry mucous membranes, dry and cracking lips  Eyes: Conjunctivae are normal. Pupils are equal, round, and reactive to light. Scleral icterus is present.  Neck: Normal range of motion. Neck supple. No JVD present.  Cardiovascular: Regular rhythm and normal heart sounds.  Exam reveals no gallop and no friction rub.   No murmur heard. tachycardia  Pulmonary/Chest: Accessory muscle usage present. No stridor. Tachypnea noted. He is in respiratory distress. He has decreased breath sounds. He has no wheezes. He has no rhonchi. He has no rales.  Abdominal: Soft. Bowel sounds are normal. He exhibits no distension. There is tenderness. There is guarding. There is no rebound.    Musculoskeletal: He exhibits edema and tenderness.    Patient with RLE wound with extensive tissue destruction, achilles tendon visible though wound, necrosis of tissue and foul odor noted, 2+ DP pulse, right great toe with mottling and bruising.  Lymphadenopathy:    He has no cervical adenopathy.  Neurological: He is oriented to person, place, and time. He appears lethargic. He exhibits normal muscle tone. Coordination normal.  Skin: Skin is dry. There is pallor.  Poor skin turgor    ED Course  Procedures (including critical care time) Labs Review Labs Reviewed  CBC WITH DIFFERENTIAL - Abnormal; Notable for the following:    WBC 33.5 (*)    RBC 2.51 (*)    Hemoglobin 8.0 (*)    HCT 23.3 (*)    RDW 15.7 (*)    Platelets 529 (*)    All other components within normal limits  CG4 I-STAT (LACTIC ACID) - Abnormal; Notable for the following:    Lactic Acid, Venous 6.11 (*)    All other components within normal limits  CULTURE, BLOOD (ROUTINE X 2)  CULTURE, BLOOD (ROUTINE X 2)  URINE CULTURE  COMPREHENSIVE METABOLIC PANEL  LIPASE, BLOOD  URINALYSIS, ROUTINE W REFLEX MICROSCOPIC  POCT I-STAT TROPONIN I   Imaging Review Dg Chest Portable 1 View  09/07/2013   CLINICAL DATA:  Shortness of breath  EXAM: PORTABLE CHEST - 1 VIEW  COMPARISON:  August 05, 2012  FINDINGS: The heart size and mediastinal contours are within normal limits. There is no focal infiltrate, pulmonary edema, or pleural effusion. In the lateral left seventh rib, there is mild cortical discontinuity suspicious for acute fracture. Chronic posttraumatic change of the lateral left eighth and ninth ribs are noted.  IMPRESSION: No focal infiltrate identified. Fracture of the lateral left seventh rib.   Electronically Signed   By: Abelardo Diesel M.D.   On: 09/07/2013 21:28    EKG Interpretation   None       MDM   Shortness of breath Sepsis Acute renal failure Hyperkalemia   Patient here with no recent medical care who presents with several day history of worsening  shortness of breath, abdominal pain with multiple non-healing wounds to his right lower extremity and sacral area.  Upon my initial evaluation of the patient, I immediately got Dr. Rogene Houston involved in the care of this complex and critically ill patient.  Please see his note for critical care administered by him.  Patient has remained normotensive with normal vital signs despite his very toxic clinical appearance.  Patient admitted to ICU via critical care.   Idalia Needle Joelyn Oms, [place] 09/08/13 [phone]

## 2013-09-07 NOTE — ED Notes (Signed)
Patient HR on monitor was 170's. RN went into room to assess patient and patient was having trouble breathing. RN placed patient on Non-rebreather and sat patient up in bed. RN noticed wide QRS complexes on monitor and grabbed the crash cart and placed patient on the zoll. RN yelled for MD assistance.

## 2013-09-08 ENCOUNTER — Encounter (HOSPITAL_COMMUNITY)
Admission: EM | Disposition: A | Discharge status: Skilled Nursing Facility | Payer: Medicaid Other | Source: Home / Self Care | Attending: Pulmonary Disease

## 2013-09-08 ENCOUNTER — Encounter (HOSPITAL_COMMUNITY): Payer: Self-pay | Admitting: *Deleted

## 2013-09-08 ENCOUNTER — Inpatient Hospital Stay (HOSPITAL_COMMUNITY): Payer: Medicaid Other

## 2013-09-08 ENCOUNTER — Encounter: Payer: Self-pay | Admitting: Vascular Surgery

## 2013-09-08 DIAGNOSIS — I96 Gangrene, not elsewhere classified: Secondary | ICD-10-CM | POA: Diagnosis present

## 2013-09-08 DIAGNOSIS — L89150 Pressure ulcer of sacral region, unstageable: Secondary | ICD-10-CM | POA: Diagnosis present

## 2013-09-08 DIAGNOSIS — E872 Acidosis, unspecified: Secondary | ICD-10-CM | POA: Diagnosis present

## 2013-09-08 DIAGNOSIS — N179 Acute kidney failure, unspecified: Secondary | ICD-10-CM | POA: Diagnosis present

## 2013-09-08 DIAGNOSIS — E875 Hyperkalemia: Secondary | ICD-10-CM | POA: Diagnosis present

## 2013-09-08 DIAGNOSIS — J45909 Unspecified asthma, uncomplicated: Secondary | ICD-10-CM

## 2013-09-08 DIAGNOSIS — J449 Chronic obstructive pulmonary disease, unspecified: Secondary | ICD-10-CM | POA: Diagnosis present

## 2013-09-08 HISTORY — PX: ESOPHAGOGASTRODUODENOSCOPY: SHX5428

## 2013-09-08 HISTORY — PX: FLEXIBLE SIGMOIDOSCOPY: SHX5431

## 2013-09-08 LAB — GLUCOSE, CAPILLARY
GLUCOSE-CAPILLARY: 65 mg/dL — AB (ref 70–99)
GLUCOSE-CAPILLARY: 56 mg/dL — AB (ref 70–99)
GLUCOSE-CAPILLARY: 32 mg/dL — AB (ref 70–99)
Glucose-Capillary: 86 mg/dL (ref 70–99)
Glucose-Capillary: 176 mg/dL — ABNORMAL HIGH (ref 70–99)
Glucose-Capillary: 41 mg/dL — CL (ref 70–99)
Glucose-Capillary: 98 mg/dL (ref 70–99)
Glucose-Capillary: 92 mg/dL (ref 70–99)
Glucose-Capillary: 121 mg/dL — ABNORMAL HIGH (ref 70–99)
Glucose-Capillary: 97 mg/dL (ref 70–99)

## 2013-09-08 LAB — BLOOD GAS, ARTERIAL
ACID-BASE EXCESS: 0.7 mmol/L (ref 0.0–2.0)
Bicarbonate: 24.1 mEq/L — ABNORMAL HIGH (ref 20.0–24.0)
Drawn by: 29017
O2 Saturation: 83.7 %
PATIENT TEMPERATURE: 97.2
TCO2: 25.1 mmol/L (ref 0–100)
pCO2 arterial: 32.9 mmHg — ABNORMAL LOW (ref 35.0–45.0)
pH, Arterial: 7.474 — ABNORMAL HIGH (ref 7.350–7.450)
pO2, Arterial: 51.1 mmHg — ABNORMAL LOW (ref 80.0–100.0)

## 2013-09-08 LAB — BASIC METABOLIC PANEL
BUN: 106 mg/dL — ABNORMAL HIGH (ref 6–23)
BUN: 80 mg/dL — ABNORMAL HIGH (ref 6–23)
BUN: 81 mg/dL — AB (ref 6–23)
CALCIUM: 7.3 mg/dL — AB (ref 8.4–10.5)
CALCIUM: 8.5 mg/dL (ref 8.4–10.5)
CHLORIDE: 98 meq/L (ref 96–112)
CO2: 19 mEq/L (ref 19–32)
CO2: 22 mEq/L (ref 19–32)
CO2: 20 meq/L (ref 19–32)
Calcium: 8 mg/dL — ABNORMAL LOW (ref 8.4–10.5)
Chloride: 87 mEq/L — ABNORMAL LOW (ref 96–112)
Chloride: 100 mEq/L (ref 96–112)
Creatinine, Ser: 5.48 mg/dL — ABNORMAL HIGH (ref 0.50–1.35)
Creatinine, Ser: 3.93 mg/dL — ABNORMAL HIGH (ref 0.50–1.35)
Creatinine, Ser: 3.8 mg/dL — ABNORMAL HIGH (ref 0.50–1.35)
GFR calc Af Amer: 19 mL/min — ABNORMAL LOW (ref >90)
GFR, EST AFRICAN AMERICAN: 12 mL/min — AB (ref >90)
GFR, EST AFRICAN AMERICAN: 18 mL/min — AB (ref >90)
GFR, EST NON AFRICAN AMERICAN: 11 mL/min — AB (ref >90)
GFR, EST NON AFRICAN AMERICAN: 16 mL/min — AB (ref >90)
GFR, EST NON AFRICAN AMERICAN: 16 mL/min — AB (ref >90)
GLUCOSE: 126 mg/dL — AB (ref 70–99)
Glucose, Bld: 108 mg/dL — ABNORMAL HIGH (ref 70–99)
Glucose, Bld: 92 mg/dL (ref 70–99)
POTASSIUM: 5.8 meq/L — AB (ref 3.7–5.3)
POTASSIUM: 7.7 meq/L — AB (ref 3.7–5.3)
Potassium: 5.1 mEq/L (ref 3.7–5.3)
SODIUM: 131 meq/L — AB (ref 137–147)
SODIUM: 140 meq/L (ref 137–147)
Sodium: 141 mEq/L (ref 137–147)

## 2013-09-08 LAB — CBC
HCT: 29.4 % — ABNORMAL LOW (ref 39.0–52.0)
HCT: 25.6 % — ABNORMAL LOW (ref 39.0–52.0)
HEMATOCRIT: 24.9 % — AB (ref 39.0–52.0)
HEMOGLOBIN: 8.7 g/dL — AB (ref 13.0–17.0)
HEMOGLOBIN: 8.6 g/dL — AB (ref 13.0–17.0)
Hemoglobin: 10 g/dL — ABNORMAL LOW (ref 13.0–17.0)
MCH: 31.4 pg (ref 26.0–34.0)
MCH: 31.4 pg (ref 26.0–34.0)
MCH: 31.8 pg (ref 26.0–34.0)
MCHC: 33.6 g/dL (ref 30.0–36.0)
MCHC: 34 g/dL (ref 30.0–36.0)
MCHC: 34.9 g/dL (ref 30.0–36.0)
MCV: 92.5 fL (ref 78.0–100.0)
MCV: 90.9 fL (ref 78.0–100.0)
MCV: 93.4 fL (ref 78.0–100.0)
PLATELETS: 335 10*3/uL (ref 150–400)
Platelets: 291 10*3/uL (ref 150–400)
Platelets: 244 10*3/uL (ref 150–400)
RBC: 3.18 MIL/uL — ABNORMAL LOW (ref 4.22–5.81)
RBC: 2.74 MIL/uL — ABNORMAL LOW (ref 4.22–5.81)
RBC: 2.74 MIL/uL — ABNORMAL LOW (ref 4.22–5.81)
RDW: 15.2 % (ref 11.5–15.5)
RDW: 15.5 % (ref 11.5–15.5)
RDW: 15.9 % — AB (ref 11.5–15.5)
WBC: 18.3 10*3/uL — ABNORMAL HIGH (ref 4.0–10.5)
WBC: 14.1 10*3/uL — ABNORMAL HIGH (ref 4.0–10.5)
WBC: 11.5 10*3/uL — ABNORMAL HIGH (ref 4.0–10.5)

## 2013-09-08 LAB — POCT ACTIVATED CLOTTING TIME
ACTIVATED CLOTTING TIME: 127 s
Activated Clotting Time: 127 seconds
Activated Clotting Time: 127 seconds
Activated Clotting Time: 121 seconds

## 2013-09-08 LAB — OCCULT BLOOD X 1 CARD TO LAB, STOOL: Fecal Occult Bld: POSITIVE — AB

## 2013-09-08 LAB — URINALYSIS, ROUTINE W REFLEX MICROSCOPIC
BILIRUBIN URINE: NEGATIVE
GLUCOSE, UA: NEGATIVE mg/dL
KETONES UR: NEGATIVE mg/dL
Leukocytes, UA: NEGATIVE
Nitrite: NEGATIVE
PH: 6.5 (ref 5.0–8.0)
PROTEIN: 30 mg/dL — AB
Specific Gravity, Urine: 1.014 (ref 1.005–1.030)
Urobilinogen, UA: 0.2 mg/dL (ref 0.0–1.0)

## 2013-09-08 LAB — URINE MICROSCOPIC-ADD ON

## 2013-09-08 LAB — RENAL FUNCTION PANEL
Albumin: 2 g/dL — ABNORMAL LOW (ref 3.5–5.2)
BUN: 80 mg/dL — AB (ref 6–23)
CALCIUM: 7.6 mg/dL — AB (ref 8.4–10.5)
CHLORIDE: 96 meq/L (ref 96–112)
CO2: 24 mEq/L (ref 19–32)
CREATININE: 3.97 mg/dL — AB (ref 0.50–1.35)
GFR calc Af Amer: 18 mL/min — ABNORMAL LOW (ref >90)
GFR calc non Af Amer: 16 mL/min — ABNORMAL LOW (ref >90)
GLUCOSE: 111 mg/dL — AB (ref 70–99)
Phosphorus: 6.3 mg/dL — ABNORMAL HIGH (ref 2.3–4.6)
Potassium: 5 mEq/L (ref 3.7–5.3)
Sodium: 139 mEq/L (ref 137–147)

## 2013-09-08 LAB — MAGNESIUM
MAGNESIUM: 2.9 mg/dL — AB (ref 1.5–2.5)
Magnesium: 2.3 mg/dL (ref 1.5–2.5)

## 2013-09-08 LAB — LACTIC ACID, PLASMA: Lactic Acid, Venous: 5.8 mmol/L — ABNORMAL HIGH (ref 0.5–2.2)

## 2013-09-08 LAB — PROTIME-INR
INR: 1.31 (ref 0.00–1.49)
Prothrombin Time: 16 seconds — ABNORMAL HIGH (ref 11.6–15.2)

## 2013-09-08 LAB — CREATININE, URINE, RANDOM: Creatinine, Urine: 39.04 mg/dL

## 2013-09-08 LAB — CLOSTRIDIUM DIFFICILE BY PCR: CDIFFPCR: NEGATIVE

## 2013-09-08 LAB — TROPONIN I: Troponin I: <0.3 ng/mL (ref <0.30)

## 2013-09-08 LAB — PROCALCITONIN: Procalcitonin: 4.57 ng/mL

## 2013-09-08 LAB — CORTISOL: Cortisol, Plasma: 76.5 ug/dL

## 2013-09-08 LAB — SODIUM, URINE, RANDOM: Sodium, Ur: 34 mEq/L

## 2013-09-08 LAB — ABO/RH: ABO/RH(D): A POS

## 2013-09-08 LAB — CK: Total CK: 47 U/L (ref 7–232)

## 2013-09-08 LAB — MRSA PCR SCREENING: MRSA BY PCR: NEGATIVE

## 2013-09-08 LAB — PHOSPHORUS: PHOSPHORUS: 9.3 mg/dL — AB (ref 2.3–4.6)

## 2013-09-08 SURGERY — EGD (ESOPHAGOGASTRODUODENOSCOPY)
Anesthesia: Moderate Sedation

## 2013-09-08 MED ORDER — SODIUM CHLORIDE 0.9 % IJ SOLN
250.0000 [IU]/h | INTRAMUSCULAR | Status: DC
  Administered 2013-09-08: 250 [IU]/h via INTRAVENOUS_CENTRAL
  Filled 2013-09-08 (×2): qty 2

## 2013-09-08 MED ORDER — GI COCKTAIL ~~LOC~~
30.0000 mL | Freq: Once | ORAL | Status: AC
  Administered 2013-09-08: 30 mL via ORAL
  Filled 2013-09-08: qty 30

## 2013-09-08 MED ORDER — SODIUM CHLORIDE 0.9 % IV SOLN
1.0000 g | Freq: Once | INTRAVENOUS | Status: AC
  Administered 2013-09-08: 1 g via INTRAVENOUS
  Filled 2013-09-08: qty 10

## 2013-09-08 MED ORDER — DEXTROSE 50 % IV SOLN
50.0000 mL | Freq: Once | INTRAVENOUS | Status: AC | PRN
Start: 2013-09-08 — End: 2013-09-08

## 2013-09-08 MED ORDER — INSULIN ASPART 100 UNIT/ML ~~LOC~~ SOLN
10.0000 [IU] | Freq: Once | SUBCUTANEOUS | Status: AC
  Administered 2013-09-08: 10 [IU] via INTRAVENOUS

## 2013-09-08 MED ORDER — HEPARIN (PORCINE) 2000 UNITS/L FOR CRRT
INTRAVENOUS_CENTRAL | Status: DC | PRN
  Filled 2013-09-08: qty 1000

## 2013-09-08 MED ORDER — DEXTROSE 50 % IV SOLN
INTRAVENOUS | Status: AC
  Administered 2013-09-08: 50 mL
  Filled 2013-09-08: qty 50

## 2013-09-08 MED ORDER — NOREPINEPHRINE BITARTRATE 1 MG/ML IJ SOLN
2.0000 ug/min | INTRAVENOUS | Status: DC
  Administered 2013-09-08: 25 ug/min via INTRAVENOUS
  Administered 2013-09-10: 20 ug/min via INTRAVENOUS
  Filled 2013-09-08 (×4): qty 16

## 2013-09-08 MED ORDER — SODIUM CHLORIDE 0.9 % IV SOLN
INTRAVENOUS | Status: DC
Start: 2013-09-08 — End: 2013-09-09
  Administered 2013-09-08: 17:00:00 via INTRAVENOUS

## 2013-09-08 MED ORDER — DEXTROSE 50 % IV SOLN
INTRAVENOUS | Status: AC
  Administered 2013-09-08: 50 mL via INTRAVENOUS
  Filled 2013-09-08: qty 50

## 2013-09-08 MED ORDER — PIPERACILLIN-TAZOBACTAM 3.375 G IVPB
3.3750 g | Freq: Four times a day (QID) | INTRAVENOUS | Status: DC
  Administered 2013-09-08: 3.375 g via INTRAVENOUS
  Filled 2013-09-08 (×2): qty 50

## 2013-09-08 MED ORDER — MIDAZOLAM HCL 5 MG/ML IJ SOLN
INTRAMUSCULAR | Status: AC
  Filled 2013-09-08: qty 2

## 2013-09-08 MED ORDER — COLLAGENASE 250 UNIT/GM EX OINT
TOPICAL_OINTMENT | Freq: Every day | CUTANEOUS | Status: DC
  Administered 2013-09-08 – 2013-09-13 (×4): via TOPICAL
  Administered 2013-09-14: 1 via TOPICAL
  Administered 2013-09-15 – 2013-09-21 (×7): via TOPICAL
  Filled 2013-09-08 (×3): qty 30

## 2013-09-08 MED ORDER — HEPARIN SODIUM (PORCINE) 1000 UNIT/ML DIALYSIS
1000.0000 [IU] | INTRAMUSCULAR | Status: DC | PRN
  Administered 2013-09-08: 4000 [IU] via INTRAVENOUS_CENTRAL
  Filled 2013-09-08: qty 6

## 2013-09-08 MED ORDER — CLINDAMYCIN PHOSPHATE 600 MG/50ML IV SOLN
600.0000 mg | Freq: Once | INTRAVENOUS | Status: AC
  Administered 2013-09-08: 600 mg via INTRAVENOUS

## 2013-09-08 MED ORDER — NOREPINEPHRINE BITARTRATE 1 MG/ML IJ SOLN
2.0000 ug/min | INTRAMUSCULAR | Status: DC
  Administered 2013-09-08: 25 ug/min via INTRAVENOUS
  Administered 2013-09-08: 5 ug/min via INTRAVENOUS
  Filled 2013-09-08 (×4): qty 4

## 2013-09-08 MED ORDER — SODIUM CHLORIDE 0.9 % IV BOLUS (SEPSIS)
1000.0000 mL | Freq: Once | INTRAVENOUS | Status: AC
  Administered 2013-09-08: 1000 mL via INTRAVENOUS

## 2013-09-08 MED ORDER — PIPERACILLIN-TAZOBACTAM IN DEX 2-0.25 GM/50ML IV SOLN
2.2500 g | Freq: Three times a day (TID) | INTRAVENOUS | Status: DC
  Administered 2013-09-08 – 2013-09-09 (×2): 2.25 g via INTRAVENOUS
  Filled 2013-09-08 (×4): qty 50

## 2013-09-08 MED ORDER — INSULIN ASPART 100 UNIT/ML ~~LOC~~ SOLN
0.0000 [IU] | SUBCUTANEOUS | Status: DC
  Administered 2013-09-09: 2 [IU] via SUBCUTANEOUS
  Administered 2013-09-10: 1 [IU] via SUBCUTANEOUS
  Administered 2013-09-10 (×2): 2 [IU] via SUBCUTANEOUS
  Administered 2013-09-10 – 2013-09-14 (×4): 1 [IU] via SUBCUTANEOUS
  Administered 2013-09-15: 2 [IU] via SUBCUTANEOUS

## 2013-09-08 MED ORDER — FENTANYL CITRATE 0.05 MG/ML IJ SOLN
INTRAMUSCULAR | Status: AC
  Filled 2013-09-08: qty 2

## 2013-09-08 MED ORDER — DEXTROSE 5 % IV SOLN: INTRAVENOUS | Status: DC

## 2013-09-08 MED ORDER — CLINDAMYCIN PHOSPHATE 600 MG/50ML IV SOLN
600.0000 mg | Freq: Four times a day (QID) | INTRAVENOUS | Status: DC
  Administered 2013-09-08 – 2013-09-12 (×16): 600 mg via INTRAVENOUS
  Filled 2013-09-08 (×21): qty 50

## 2013-09-08 MED ORDER — PRISMASOL BGK 0/2.5 32-2.5 MEQ/L IV SOLN
INTRAVENOUS | Status: DC
  Administered 2013-09-08: 03:00:00 via INTRAVENOUS_CENTRAL
  Filled 2013-09-08 (×2): qty 5000

## 2013-09-08 MED ORDER — VANCOMYCIN HCL 500 MG IV SOLR
500.0000 mg | INTRAVENOUS | Status: DC
  Filled 2013-09-08: qty 500

## 2013-09-08 MED ORDER — SODIUM CHLORIDE 0.9 % IV BOLUS (SEPSIS)
500.0000 mL | Freq: Once | INTRAVENOUS | Status: AC
  Administered 2013-09-08: 500 mL via INTRAVENOUS

## 2013-09-08 MED ORDER — PRISMASOL BGK 0/2.5 32-2.5 MEQ/L IV SOLN
INTRAVENOUS | Status: DC
  Administered 2013-09-08: 03:00:00 via INTRAVENOUS_CENTRAL
  Filled 2013-09-08 (×5): qty 5000

## 2013-09-08 MED ORDER — SODIUM BICARBONATE 8.4 % IV SOLN
INTRAVENOUS | Status: AC
  Administered 2013-09-08: 50 meq via INTRAVENOUS
  Filled 2013-09-08: qty 50

## 2013-09-08 MED ORDER — DEXTROSE 50 % IV SOLN
25.0000 mL | Freq: Once | INTRAVENOUS | Status: AC | PRN
  Administered 2013-09-08: 25 mL via INTRAVENOUS

## 2013-09-08 MED ORDER — DEXTROSE-NACL 5-0.45 % IV SOLN
INTRAVENOUS | Status: DC
  Administered 2013-09-08: 18:00:00 via INTRAVENOUS

## 2013-09-08 MED ORDER — HEPARIN BOLUS VIA INFUSION (CRRT)
1000.0000 [IU] | INTRAVENOUS | Status: DC | PRN
  Administered 2013-09-08 (×3): 1000 [IU] via INTRAVENOUS_CENTRAL
  Filled 2013-09-08: qty 1000

## 2013-09-08 MED ORDER — HEPARIN SODIUM (PORCINE) 1000 UNIT/ML DIALYSIS: 1000.0000 [IU] | INTRAMUSCULAR | Status: DC | PRN

## 2013-09-08 MED ORDER — DEXTROSE 50 % IV SOLN
1.0000 | Freq: Once | INTRAVENOUS | Status: AC
  Administered 2013-09-08: 50 mL via INTRAVENOUS

## 2013-09-08 MED ORDER — DEXTROSE 50 % IV SOLN
INTRAVENOUS | Status: AC
  Filled 2013-09-08: qty 50

## 2013-09-08 MED ORDER — SODIUM BICARBONATE 8.4 % IV SOLN
50.0000 meq | Freq: Once | INTRAVENOUS | Status: AC
  Administered 2013-09-08: 50 meq via INTRAVENOUS

## 2013-09-08 MED ORDER — SODIUM CHLORIDE 0.9 % IV BOLUS (SEPSIS)
500.0000 mL | INTRAVENOUS | Status: AC
  Administered 2013-09-08: 500 mL via INTRAVENOUS

## 2013-09-08 MED FILL — Medication: Qty: 1 | Status: AC

## 2013-09-08 NOTE — Consult Note (Signed)
Referring Provider: Dr. Unknown Jim Primary Care Physician:  Webb Silversmith, NP Primary Gastroenterologist:  None (unassigned)  Reason for Consultation:  GI bleeding  HPI: Edwin [name] is a 58 y.o. male admitted earlier today septic from a gangrenous leg, for which amputation tomorrow is anticipated. This afternoon, he started having copious amounts of red blood per rectum, associated with hypotension and tachycardia. He was on naproxen prior to admission, no PPI coverage, but denies history of previous ulcer disease .  Multiple comorbidities include COPD, peripheral vascular disease, history of CHF, and current lower extremity gangrene.  Past Medical History  Diagnosis Date  . COPD 11/27/2007    Qualifier: Diagnosis of  By: Garen Grams    . ERECTILE DYSFUNCTION 11/27/2007    Qualifier: Diagnosis of  By: Garen Grams    . HYPERLIPIDEMIA 11/27/2007    Qualifier: Diagnosis of  By: Garen Grams    . HYPERTENSION, BENIGN 05/23/2009    Qualifier: Diagnosis of  By: Melvyn Novas MD, Christena Deem   . Coronary artery disease     Cardiac catheterization in 2008 showed 99% stenosis in proximal left circumflex which was treated with Taxus drug-eluting stent. There was mild RCA and LAD disease with normal ejection fraction.  Marland Kitchen PAD (peripheral artery disease)     Previous right SFA stent in 2003. Directional atherectomy right SFA in 01/2013  . Tobacco use   . CHF (congestive heart failure)   . Hypertension   . Asthma     Past Surgical History  Procedure Laterality Date  . Nasal sinus surgery  2004  . Acne cyst removal    . Other surgical history      blocked artery  . Endarterectomy Left 08/16/2013    Procedure: Exploration of Left Neck/Fix Bleeding;  Surgeon: Elam Dutch, MD;  Location: [facility];  Service: Vascular;  Laterality: Left;    Prior to Admission medications   Medication Sig Start Date End Date Taking? Authorizing Provider  collagenase (SANTYL) ointment Apply 1 application  topically daily.   Yes Historical Provider, MD  furosemide (LASIX) 40 MG tablet Take 1 tablet (40 mg total) by mouth 2 (two) times daily. 08/22/13  Yes Lisette Abu, PA-C  HYDROcodone-acetaminophen (NORCO/VICODIN) 5-325 MG per tablet Take 1 tablet by mouth every 6 (six) hours as needed.  03/16/13  Yes Historical Provider, MD  lisinopril (PRINIVIL,ZESTRIL) 20 MG tablet Take 1 tablet (20 mg total) by mouth daily. 08/22/13  Yes Lisette Abu, PA-C  mirtazapine (REMERON SOL-TAB) 15 MG disintegrating tablet Take 15 mg by mouth at bedtime.   Yes Historical Provider, MD  naproxen (NAPROSYN) 500 MG tablet Take 500 mg by mouth 2 (two) times daily with a meal.   Yes Historical Provider, MD  oxyCODONE 10 MG TABS Take 1-2 tablets (10-20 mg total) by mouth every 4 (four) hours as needed for moderate pain. 08/22/13  Yes Lisette Abu, PA-C  potassium chloride SA (K-DUR,KLOR-CON) 20 MEQ tablet Take 2 tablets (40 mEq total) by mouth 2 (two) times daily. 08/22/13  Yes Lisette Abu, PA-C  sodium hypochlorite (DAKIN'S 1/2 STRENGTH) external solution Irrigate with as directed 2 (two) times daily. 08/22/13  Yes Lisette Abu, PA-C  traMADol (ULTRAM) 50 MG tablet Take 50-100 mg by mouth every 8 (eight) hours as needed for moderate pain.   Yes Historical Provider, MD    Current Facility-Administered Medications  Medication Dose Route Frequency Provider Last Rate Last Dose  . 0.9 %  sodium chloride infusion  250  mL Intravenous PRN Erick Colace, NP      . 0.9 %  sodium chloride infusion   Intravenous Continuous Chesley Mires, MD 100 mL/hr at 09/08/13 0026    . albuterol (PROVENTIL) (2.5 MG/3ML) 0.083% nebulizer solution 2.5 mg  2.5 mg Nebulization Q2H PRN Chesley Mires, MD      . clindamycin (CLEOCIN) IVPB 600 mg  600 mg Intravenous 4 times per day Chesley Mires, MD   600 mg at 09/08/13 0629  . collagenase (SANTYL) ointment   Topical Daily Chesley Mires, MD      . dextrose 50 % solution 50 mL  50 mL  Intravenous Once PRN Chesley Mires, MD      . dextrose 50 % solution           . insulin aspart (novoLOG) injection 0-9 Units  0-9 Units Subcutaneous 6 times per day Colbert Coyer, MD      . norepinephrine (LEVOPHED) 4 mg in dextrose 5 % 250 mL infusion  2-50 mcg/min Intravenous Continuous Konstantin Zubelevitskiy, MD      . pantoprazole (PROTONIX) injection 40 mg  40 mg Intravenous QHS Erick Colace, NP   40 mg at 09/07/13 2358  . piperacillin-tazobactam (ZOSYN) IVPB 2.25 g  2.25 g Intravenous 3 times per day Thuy Dien Dang, [facility]      . sodium chloride 0.9 % bolus 500 mL  500 mL Intravenous Q30 min Doree Fudge, MD        Allergies as of 09/07/2013 - Review Complete 09/07/2013  Allergen Reaction Noted  . Codeine Nausea Only 02/23/2013    Family History  Problem Relation Age of Onset  . Adopted: Yes  . Heart disease Maternal Grandfather   . Heart disease Paternal Grandfather     History   Social History  . Marital Status: Single    Spouse Name: N/A    Number of Children: N/A  . Years of Education: 12   Occupational History  . Cutter Operator    Social History Main Topics  . Smoking status: Current Every Day Smoker -- 1.50 packs/day for 40 years  . Smokeless tobacco: Not on file  . Alcohol Use: 14.4 oz/week    4 Cans of beer per week  . Drug Use: Yes  . Sexual Activity: Not Currently   Other Topics Concern  . Not on file   Social History Narrative   ** Merged History Encounter **       Regular exercise-no   Caffeine Use-yes    Review of Systems: Not obtained  Physical Exam: Vital signs in last 24 hours: Temp:  [95.3 F (35.2 C)-98 F (36.7 C)] 97 F (36.1 C) (02/12 0800) Pulse Rate:  [52-120] 52 (02/12 0800) Resp:  [13-30] 21 (02/12 0800) BP: (72-137)/(38-79) 97/67 mmHg (02/12 0800) SpO2:  [84 %-100 %] 84 % (02/12 0800) Weight:  [52.4 kg (115 lb 8.3 oz)-54 kg (119 lb 0.8 oz)] 54 kg (119 lb 0.8 oz) (02/12 0500) Last BM Date: 09/07/13   this is a thin Caucasian male with a COPD body habitus, sitting upright in bed, awake and alert, mild respiratory distress, skin warm, radial pulse slightly weak. Blood pressure 74 systolic, heart rate 109. Chest without wheezes, our without murmurs. Abdomen slightly distended, diffusely tender, no bowel sounds, no rigidity or peritoneal findings  Intake/Output from previous day: 02/11 0701 - 02/12 0700 In: 2808.3 [I.V.:915; IV Piggyback:1893.3] Out: 573 [Urine:565] Intake/Output this shift: Total I/O In: 1040 [P.O.:240; I.V.:800] Out: 495 [Urine:258;  Other:237]  Lab Results:  Recent Labs  09/07/13 2047 09/08/13 0020 09/08/13 0225  WBC 33.5* 18.3* 14.1*  HGB 8.0* 10.0* 8.7*  HCT 23.3* 29.4* 24.9*  PLT 529* 335 291   BMET  Recent Labs  09/08/13 0020 09/08/13 0500 09/08/13 0805  NA 131* 139 140  K 7.7* 5.0 5.8*  CL 87* 96 98  CO2 19 24 22   GLUCOSE 108* 111* 92  BUN 106* 80* 80*  CREATININE 5.48* 3.97* 3.93*  CALCIUM 8.5 7.6* 8.0*   LFT  Recent Labs  09/07/13 2047 09/08/13 0500  PROT 7.4  --   ALBUMIN 2.5* 2.0*  AST 32  --   ALT 17  --   ALKPHOS 140*  --   BILITOT 0.6  --    PT/INR  Recent Labs  09/08/13 0020  LABPROT 16.0*  INR 1.31    Studies/Results: Dg Chest Port 1 View  09/08/2013   CLINICAL DATA:  HD catheter placement  EXAM: PORTABLE CHEST - 1 VIEW  COMPARISON:  September 07, 2013 9:12 p.m.  FINDINGS: The heart size and mediastinal contours are within normal limits. Right central line is identified with distal tip in the superior vena cava. There is no pneumothorax. There is no focal infiltrate, pulmonary edema, or pleural effusion. The visualized skeletal structures are stable.  IMPRESSION: Right jugular HD catheter is identified with the distal tip in the superior vena cava. There is no pneumothorax.   Electronically Signed   By: Abelardo Diesel M.D.   On: 09/08/2013 02:32   Dg Chest Portable 1 View  09/07/2013   CLINICAL DATA:  Shortness of breath   EXAM: PORTABLE CHEST - 1 VIEW  COMPARISON:  August 05, 2012  FINDINGS: The heart size and mediastinal contours are within normal limits. There is no focal infiltrate, pulmonary edema, or pleural effusion. In the lateral left seventh rib, there is mild cortical discontinuity suspicious for acute fracture. Chronic posttraumatic change of the lateral left eighth and ninth ribs are noted.  IMPRESSION: No focal infiltrate identified. Fracture of the lateral left seventh rib.   Electronically Signed   By: Abelardo Diesel M.D.   On: 09/07/2013 21:28   Dg Tibia/fibula Right Port  09/08/2013   CLINICAL DATA:  Osteomyelitis.  Open wound on the right leg.  EXAM: PORTABLE RIGHT TIBIA AND FIBULA - 2 VIEW  COMPARISON:  Ankle film today.  FINDINGS: There is no plain film evidence of osteomyelitis. High density material is present in the soft tissues of the mid to distal right leg, suggesting radiopaque dressing material. There is no osteolysis of the tibia or fibula. Small vessel atherosclerosis. Irregularity of the distal soft tissues of the leg, suggesting ulceration.  IMPRESSION: No plain film evidence of osteomyelitis.   Electronically Signed   By: Dereck Ligas M.D.   On: 09/08/2013 00:23   Dg Ankle Right Port  09/08/2013   CLINICAL DATA:  Osteomyelitis.  Open wound on the right leg.  EXAM: PORTABLE RIGHT ANKLE - 2 VIEW  COMPARISON:  None.  FINDINGS: Osteopenia is present. There is soft tissue irregularity in the distal posterior leg. No cortical osteolysis or rarefaction of bone to suggest osseous infection. The ankle mortise is congruent. The alignment of the ankle is anatomic. Suggestion of pes planus.  IMPRESSION: No acute osseous injury. No plain film evidence of osteomyelitis. Soft tissue irregularity along the dorsal distal leg, compatible with large ulceration.   Electronically Signed   By: Dereck Ligas M.D.   On:  09/08/2013 00:22    Impression:  Acute GI bleed with hemodynamic instability in a  critically all patient. His presenting sepsis and acidosis are being treated, and it is not clear to what degree his current hypotension reflect sepsis versus GI bleeding. It is also not clear whether the GI bleeding is of upper tract origin, such as an ulcer from his naproxen, or lower tract bleeding, from ischemic colitis in the setting of severe peripheral vascular disease   Plan: endoscopy and colonoscopy/sigmoidoscopy as soon as possible. The nature, purpose, and risks of the procedure were discussed with the patient and he is agreeable to proceed. He understands that there is an increased risk in his case, given his tenuous medical status. I called the daughter's home number but there is no answer there at this time.   LOS: 1 day   Tiffanni Scarfo V  09/08/2013, 4:23 PM

## 2013-09-08 NOTE — Procedures (Signed)
Central Venous Catheter Insertion Procedure Note Edwin [name] [id number] 06/17/1956  Procedure: Insertion of Hemodialysis Catheter Indications: Hyperkalemia  Procedure Details Consent: Risks of procedure as well as the alternatives and risks of each were explained to the (patient/caregiver).  Consent for procedure obtained. Time Out: Verified patient identification, verified procedure, site/side was marked, verified correct patient position, special equipment/implants available, medications/allergies/relevent history reviewed, required imaging and test results available.  Performed  Maximum sterile technique was used including antiseptics, cap, gloves, gown, hand hygiene, mask and sheet. Skin prep: Chlorhexidine; local anesthetic administered A antimicrobial bonded/coated triple lumen catheter was placed in the right internal jugular vein using the Seldinger technique.  Evaluation Blood flow good Complications: No apparent complications Patient did tolerate procedure well. Chest X-ray ordered to verify placement.  CXR: pending.  Performed by Marni Griffon, ACNP using ultrasound guidance.  I was present for procedure.  Chesley Mires, MD [facility] Pulmonary/Critical Care 09/08/2013, 2:11 AM Pager:  [phone] After 3pm call: [phone]

## 2013-09-08 NOTE — Progress Notes (Signed)
In to evaluate patient - was seen by Dr. Mercy Moore early this AM- running on CRRT for about 5 hours, K now down to 5.0 and making good urine. He is c/o heartburn, did get protonix last night.    I will actually stop the CRRT- will check labs this afternoon - he may be able to get by without anymore HD but will leave HD cath in for now and do intermittent HD if needs further.  Is npo for possible surgery (amp) but nursing reporting that is not likely to happen until tomorrow  Zella Dewan A
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
## 2013-09-08 NOTE — Progress Notes (Signed)
Endoscopy and sigmoidoscopic evaluation in this critically ill patient with hematochezia were reasonably well tolerated, without sedation.  It is noted that the patient's hemoglobin this afternoon, after his hematochezia and hypotension, was essentially unchanged from this morning.  Please see dictated procedure report for findings and recommendations.  In addition to the recommendations in the op report, you might consider placing this patient on FloraStor probiotic therapy to help prevent hospital-acquired C. difficile colitis, in view of his antibiotic therapy and debilitated status.  I will sign off at this time, but would be happy to see the patient again at your request.  Cleotis Nipper, M.D. [phone]

## 2013-09-08 NOTE — Progress Notes (Addendum)
ANTIBIOTIC CONSULT NOTE - FOLLOW UP  Pharmacy Consult:  Vancomycin / Zosyn Indication:  Rule out sepsis  Allergies  Allergen Reactions  . Codeine Nausea Only    Patient Measurements: Height: 5\' 9"  (175.3 cm) Weight: 119 lb 0.8 oz (54 kg) IBW/kg (Calculated) : 70.7  Vital Signs: Temp: 97.3 F (36.3 C) (02/12 0700) Temp src: Core (Comment) (02/12 0700) BP: 110/76 mmHg (02/12 0700) Pulse Rate: 98 (02/12 0615) Intake/Output from previous day: 02/11 0701 - 02/12 0700 In: 2808.3 [I.V.:915; IV Piggyback:1893.3] Out: 573 [Urine:565]  Labs:  Recent Labs  09/07/13 2047 09/08/13 0020 09/08/13 0110 09/08/13 0225  WBC 33.5* 18.3*  --  14.1*  HGB 8.0* 10.0*  --  8.7*  PLT 529* 335  --  291  LABCREA  --   --  39.04  --   CREATININE 5.75* 5.48*  --   --    Estimated Creatinine Clearance: 11.5 ml/min (by C-G formula based on Cr of 5.48). No results found for this basename: VANCOTROUGH, Corlis Leak, VANCORANDOM, [place], GENTPEAK, GENTRANDOM, TOBRATROUGH, TOBRAPEAK, TOBRARND, AMIKACINPEAK, AMIKACINTROU, AMIKACIN,  in the last 72 hours   Microbiology: Recent Results (from the past 720 hour(s))  MRSA PCR SCREENING     Status: None   Collection Time    08/16/13  6:03 PM      Result Value Ref Range Status   MRSA by PCR NEGATIVE  NEGATIVE Final   Comment:            The GeneXpert MRSA Assay (FDA     approved for NASAL specimens     only), is one component of a     comprehensive MRSA colonization     surveillance program. It is not     intended to diagnose MRSA     infection nor to guide or     monitor treatment for     MRSA infections.  MRSA PCR SCREENING     Status: None   Collection Time    09/08/13  1:10 AM      Result Value Ref Range Status   MRSA by PCR NEGATIVE  NEGATIVE Final   Comment:            The GeneXpert MRSA Assay (FDA     approved for NASAL specimens     only), is one component of a     comprehensive MRSA colonization     surveillance program. It is not      intended to diagnose MRSA     infection nor to guide or     monitor treatment for     MRSA infections.      Assessment: 61 YOM with history of gangrenous non-healing RLE ulcer admitted with sepsis and started on vancomycin and Zosyn.  Clindamycin added to therapy today.  He was also admitted with AKI and now started on CRRT with plan to transition to intermittent HD once hyperkalemia resolves.  Vanc 2/11 >> Zosyn 2/11 >> Clinda 2/12 >>  2/12 BCx - pending 2/12 UCx - pending   Goal of Therapy:  Vancomycin trough level 15-20 mcg/ml   Plan:  - Vanc 500mg  IV Q24H - Zosyn 3.375gm IV Q6H - Clinda 600mg  IV Q8H as ordered - Monitor for CRRT interruptions vs transition to iHD, micro data, vanc trough at C[facility]    Jannelle Notaro D. Mina Marble, PharmD, BCPS Pager:  [phone] 09/08/2013, 7:46 AM   ====================================   Addendum: - off CRRT (received ~5 hrs), may transition to [facility]  Goal of Therapy: Vanc pre-HD level:  15-25 mcg/mL Vanc trough:  15-20 mcg/mL    Plan: - Hold vanc for now - Change Zosyn to 2.25gm IV Q8H - Continue Clinda as ordered - F/U Renal plans for further vanc dosing, clinical course    Laiklynn Raczynski D. Mina Marble, PharmD, BCPS Pager:  [phone] 09/08/2013, 11:18 AM

## 2013-09-08 NOTE — Op Note (Signed)
[facility] [place], [address]   FLEXIBLE SIGMOIDOSCOPY PROCEDURE REPORT  PATIENT: Edwin, [name]  MR#: [id number] BIRTHDATE: 04-Nov-1955 , 57  yrs. old GENDER: Male ENDOSCOPIST: Ronald Lobo, MD REFERRED BY:  Dr. Unknown Jim PROCEDURE DATE:  09/08/2013 PROCEDURE:     flexible sigmoidoscopy ASA CLASS: INDICATIONS: hematochezia and hypotension in a critically ill patient with severe vascular disease, endoscopy did not show source of hematochezia MEDICATIONS:    none  DESCRIPTION OF PROCEDURE:   the procedure was done at the bedside shortly after his upper endoscopy. The procedure, including its risks, had been discussed with the patient who provided written consent. No sedation was administered for either procedure in view of the patient's tenuous medical status. preprocedural assessment was performed but was inadvertently not documented in the computer until post procedure.  Time out was performed.  For this procedure, to minimize trauma to the patient, I used the Pentax upper endoscope as a sigmoidoscope. It was advanced to approximately 30 cm, whereupon pullback was performed.  The most important finding on this exam was the absence of any blood whatsoever in the colonic lumen. There was brown amorphous fecal material present, but no blood was seen.  In the rectosigmoid region, there were some foci of erythema and some punctate mucosal hemorrhages, possibly representing some low-grade mucosal inflammation or ischemia, but there was no frank ischemic colitis, nor any evident source of rectal bleeding, such as stercoral ulceration, colonic ulcerations, vascular ectasia, or neoplasia. Pullout through the anal canal did not demonstrate significant internal hemorrhoids.  The patient tolerated the procedure reasonably well.  no biopsies were obtained.        COMPLICATIONS:  None  ENDOSCOPIC IMPRESSION:  1. No active bleeding  in the rectosigmoid region, and no blood in the rectosigmoid lumen  2. Mild mucosal abnormalities as described above, but not sufficient to account for the observed hematochezia  RECOMMENDATIONS:  General medical supportive care (for example, optimization of blood pressure and fluid status, maintenance of adequate blood count and oxygenation) to help maintain colonic mucosal integrity. No specific GI treatment needed for the lower tract.   _______________________________ eSigned:  Ronald Lobo, MD 09/08/2013 6:49 PM   PATIENT NAME:  Edwin, [name] MR#: [id number]

## 2013-09-08 NOTE — Op Note (Addendum)
[facility] [place], [address]   ENDOSCOPY PROCEDURE REPORT  PATIENT: Edwin [name], Edwin [name]  MR#: [id number] BIRTHDATE: 19-Jun-1956 , 57  yrs. old GENDER: Male ENDOSCOPIST:Anayansi Rundquist, MD REFERRED BY:  Dr. Unknown Jim PROCEDURE DATE:  09/08/2013 PROCEDURE:      upper endoscopy ASA CLASS: INDICATIONS:   hematochezia, hypotension and the patient who came in septic from a gangrenous leg and was on naproxen prior to admission MEDICATION:    none TOPICAL ANESTHETIC:    none  DESCRIPTION OF PROCEDURE:   the procedure was done at the bedside in the ICU. The procedure was explained to the patient, including the increased risk in view of this tenuous medical status, and he was agreeable to proceed and provided written consent. Time out was performed. No sedation was administered because of the patient's hypotension and tenuous status.  The Pentax pediatric upper endoscope was passed under direct vision, entering the esophagus without undue difficulty.  The esophagus was normal. No Mallory-Weiss tear, reflux esophagitis, Barrett's esophagus, varices, infection, neoplasia, ring, stricture, or significant hiatal hernia were appreciated.  The stomach was entered. It contained no blood, but had a massive residual of dark, clear gastric juice. Approximately 1200 ML's were suctioned out.  The gastric mucosa, especially along the greater curve, had innumerable shallow ulcerations, probably 30 or more ulcers. In some areas, there were large patches of ulcerated tissue with overlying dark material, possibly representing ischemic changes. There was mild gastric erythema, vascular ectasia, or any stigmata of hemorrhage in the observed ulcers.  The pylorus was patent, the duodenal bulb was unremarkable, but there seemed to be significant angulation with a lot of difficulty getting the scope to negotiate around the duodenal C-loop. However, eventually I was  able to do so, and on careful pullback, other than some very superficial duodenal mucosal erosions, I did not see any deep ulceration, anatomic abnormality, stricturing, or ulcer with edema. In other words, I could not identify any anatomic obstruction in the proximal small bowel, and in fact, during the procedure, I saw duodenal contents refluxing back into the stomach.  The scope was removed from the patient. No biopsies were obtained. He tolerated this procedure moderately well, without clinical instability but with a fair amount of gagging, coughing, and overall exertion for this frail, critically ill individual.     COMPLICATIONS: None  ENDOSCOPIC IMPRESSION:  1. No active bleeding or blood in the stomach or evident bleeding site in the upper tract  2. Massive gastric residual of nonbloody fluid, without anatomic gastric outlet obstruction or proximal duodenal anatomic obstruction; suspect gastric ileus  3. Multiple shallow gastric ulcers without stigmata of hemorrhage. Although this could be nonsteroidal gastropathy, in this clinical setting and based on the endoscopic appearance, I am suspicious about gastric mucosal ischemia   RECOMMENDATIONS:  1. Proceed to lower tract evaluation to see where his hematochezia arose from, since it occurred just an hour or so before the exam, and could not conceivably have risen from the upper tract  2. consider NG suction in view of large gastric residual and functional (versus anatomic) gastric outlet obstruction  3. Consider sucralfate suspension for gastric mucosal ulceration and possible ischemia    _______________________________ eSignedRonald Lobo, MD 09/08/2013 6:36 PM    PATIENT NAME:  Edwin [name], Edwin [name] MR#: [id number]
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
## 2013-09-08 NOTE — Progress Notes (Signed)
UR Completed.  Edwin [name] Jane [phone] 09/08/2013  

## 2013-09-08 NOTE — Progress Notes (Signed)
Name: Edwin [name] MRN: [id number] DOB: 1955-12-07    ADMISSION DATE: 09/07/2013  PRIMARY SERVICE: pccm  CHIEF COMPLAINT: Sepsis   BRIEF PATIENT DESCRIPTION:  58 year old male w/ severe PAD. Admitted on 2/12 w/ severe sepsis/ septic shock, acute renal failure, metabolic acidosis and hyperkalemia in setting of gangrenous non-healing RLE ulcer.   Daughter notes that he has had episodes of confusion and hallucinations over the last few days, and recently noted that the posterior aspect of his LE wound has had more discharge. PT presented to ER 2/12 w/ CC Shortness of breath. CXR was clear. Wound on RLE gangrenous w/ fouls smelling d/c, dx eval showed acute renal failure, severe metabolic acidosis and hyperkalemia. Developed widened QRS in ED, required emergent rx.   SIGNIFICANT EVENTS / STUDIES:  Right Tib/Fib 2/12>>> No osteo Right ankle 2/12>>> No acute process CXR 2/12: Left 7th rib fracture CRT 2/12>> 2/12 stopped  LINES / TUBES:  Foley cath 2/12>> RIJ 2/12>> PIV 2/12>>  CULTURES:  BCX2 2/12>>> pending UC 2/12>>> pending  ANTIBIOTICS:  vanc 2/12>>>  Zosyn 2/12>>>  clinda 2/12>>>   SUBJECTIVE: Pt complains of heart burn today and right leg pain  VITAL SIGNS: Temp:  [95.3 F (35.2 C)-98 F (36.7 C)] 97 F (36.1 C) (02/12 0800) Pulse Rate:  [52-120] 52 (02/12 0800) Resp:  [13-30] 21 (02/12 0800) BP: (72-137)/(38-79) 97/67 mmHg (02/12 0800) SpO2:  [84 %-100 %] 84 % (02/12 0800) Weight:  [115 lb 8.3 oz (52.4 kg)-119 lb 0.8 oz (54 kg)] 119 lb 0.8 oz (54 kg) (02/12 0500) HEMODYNAMICS:   VENTILATOR SETTINGS:   INTAKE / OUTPUT: Intake/Output     02/11 0701 - 02/12 0700 02/12 0701 - 02/13 0700   I.V. (mL/kg) 915 (16.9) 100 (1.9)   IV Piggyback 1893.3    Total Intake(mL/kg) 2808.3 (52) 100 (1.9)   Urine (mL/kg/hr) 565 28 (0.3)   Other 8 237 (2.3)   Total Output 573 265   Net +2235.3 -165          PHYSICAL EXAMINATION: General: Chronically ill appearing  white male, no acute distress, but does have some dyspnea  Neuro: Awake, alert, oriented, no focal def  HEENT: Poor dentition  Cardiovascular: rrr  Lungs: wheezing Right bibasilar, Coarse rhonchi.  Abdomen: Soft, non-tender  Musculoskeletal: Intact  Skin: Non-stageable sacral and gluteal area wound, also large gangrenous non-healing wound on right lower extremity w/ foul smelling purulent Discharge, as well as visible tendon on posterior aspect of wound    LABS:  CBC  Recent Labs Lab 09/07/13 2047 09/08/13 0020 09/08/13 0225  WBC 33.5* 18.3* 14.1*  HGB 8.0* 10.0* 8.7*  HCT 23.3* 29.4* 24.9*  PLT 529* 335 291   Coag's  Recent Labs Lab 09/08/13 0020  INR 1.31   BMET  Recent Labs Lab 09/07/13 2047 09/08/13 0020 09/08/13 0500  NA 124* 131* 139  K >7.7* 7.7* 5.0  CL 81* 87* 96  CO2 14* 19 24  BUN 106* 106* 80*  CREATININE 5.75* 5.48* 3.97*  GLUCOSE 92 108* 111*   Electrolytes  Recent Labs Lab 09/07/13 2047 09/08/13 0020 09/08/13 0500  CALCIUM 9.6 8.5 7.6*  MG  --  2.9*  --   PHOS  --  9.3* 6.3*   Sepsis Markers  Recent Labs Lab 09/07/13 2149 09/08/13 0020  LATICACIDVEN 6.11* 5.8*  PROCALCITON  --  4.57   ABG  Recent Labs Lab 09/07/13 2231 09/08/13 0559  PHART 7.406 7.474*  PCO2ART 30.0*  32.9*  PO2ART 97.0 51.1*   Liver Enzymes  Recent Labs Lab 09/07/13 2047 09/08/13 0500  AST 32  --   ALT 17  --   ALKPHOS 140*  --   BILITOT 0.6  --   ALBUMIN 2.5* 2.0*   Cardiac Enzymes  Recent Labs Lab 09/08/13 0020 09/08/13 0225  TROPONINI <0.30 <0.30   Glucose  Recent Labs Lab 09/07/13 2244 09/08/13 0107 09/08/13 0442 09/08/13 0444 09/08/13 0506 09/08/13 0738  GLUCAP <10* 86 41* 65* 176* 56*    Imaging Dg Chest Port 1 View  09/08/2013   CLINICAL DATA:  HD catheter placement  EXAM: PORTABLE CHEST - 1 VIEW  COMPARISON:  September 07, 2013 9:12 p.m.  FINDINGS: The heart size and mediastinal contours are within normal limits. Right  central line is identified with distal tip in the superior vena cava. There is no pneumothorax. There is no focal infiltrate, pulmonary edema, or pleural effusion. The visualized skeletal structures are stable.  IMPRESSION: Right jugular HD catheter is identified with the distal tip in the superior vena cava. There is no pneumothorax.   Electronically Signed   By: Abelardo Diesel M.D.   On: 09/08/2013 02:32   Dg Chest Portable 1 View  09/07/2013   CLINICAL DATA:  Shortness of breath  EXAM: PORTABLE CHEST - 1 VIEW  COMPARISON:  August 05, 2012  FINDINGS: The heart size and mediastinal contours are within normal limits. There is no focal infiltrate, pulmonary edema, or pleural effusion. In the lateral left seventh rib, there is mild cortical discontinuity suspicious for acute fracture. Chronic posttraumatic change of the lateral left eighth and ninth ribs are noted.  IMPRESSION: No focal infiltrate identified. Fracture of the lateral left seventh rib.   Electronically Signed   By: Abelardo Diesel M.D.   On: 09/07/2013 21:28   Dg Tibia/fibula Right Port  09/08/2013   CLINICAL DATA:  Osteomyelitis.  Open wound on the right leg.  EXAM: PORTABLE RIGHT TIBIA AND FIBULA - 2 VIEW  COMPARISON:  Ankle film today.  FINDINGS: There is no plain film evidence of osteomyelitis. High density material is present in the soft tissues of the mid to distal right leg, suggesting radiopaque dressing material. There is no osteolysis of the tibia or fibula. Small vessel atherosclerosis. Irregularity of the distal soft tissues of the leg, suggesting ulceration.  IMPRESSION: No plain film evidence of osteomyelitis.   Electronically Signed   By: Dereck Ligas M.D.   On: 09/08/2013 00:23   Dg Ankle Right Port  09/08/2013   CLINICAL DATA:  Osteomyelitis.  Open wound on the right leg.  EXAM: PORTABLE RIGHT ANKLE - 2 VIEW  COMPARISON:  None.  FINDINGS: Osteopenia is present. There is soft tissue irregularity in the distal posterior leg. No  cortical osteolysis or rarefaction of bone to suggest osseous infection. The ankle mortise is congruent. The alignment of the ankle is anatomic. Suggestion of pes planus.  IMPRESSION: No acute osseous injury. No plain film evidence of osteomyelitis. Soft tissue irregularity along the dorsal distal leg, compatible with large ulceration.   Electronically Signed   By: Dereck Ligas M.D.   On: 09/08/2013 00:22   CXR: Fx Left 7 th rib, otherwise no acute process  ASSESSMENT / PLAN:  PULMONARY  A: COPD  Dyspnea in setting of acidosis  P:  - PRN BIPAP -->pt felt it helped. - Supplemental oxygen. - ABG: 7.474/32.9 co2/24.1HCO3/51.1 o2. - Albuterol q 2 prn.  CARDIOVASCULAR  A: septic shock  Severe PAD, recent right arthrectomy for known severe PAD of right SFA (aug 2014)   HTN: BP soft initially P:  - MAP goal >65. - Holding nephrotoxic agents. - Trop negative x2. - IVF NaCl 179ml/hr, s/p 3 L IVF bolus.  RENAL  A:  Acute renal failure: dehydration, sepsis, ace-I, and diuretics, NSAIDs  + anion gap acidosis:sepsis, lactic acidosis  Hyperkalemia: acidosis and KCL supplementation  prob rhabdo  P:  - CK: Normal (47). - Bicarb x3 amp, currently not on drip. - Hold antihypertensives, diuretics and KCl. - Kayexalate: 30 mg x1 dose given overnight; K 5.0 currently. - CRT started 2/12 --> stopping today and starting HD. - Monitor renal function; Cr 5.48 --> 3.97 (On CRT briefly), Normal baseline. - Anion GAP 19, Delta gap 7; Resp alkalosis with metabolic acidosis. - UOP: 838/24h. - Renal consult appreciated.  GASTROINTESTINAL  A: ? GERD P:  - PPI  - GI cocktail if heartburn continues.,as long as not going to surgery today (trop negative x2) - Heart healthy diet.  HEMATOLOGIC  A: Anemia. No evidence of bleeding. Likely AOCD  P:  - Trend CBC.  - [place] heparin.  INFECTIOUS  A: RLE gangrene of lower extremity  Severe sepsis  P:  - Xray: No osteo per image. - Vascular will amputate  in AM. - Vanc/zosyn/clinda. - Cultures pending (blood and urine). - Wound care consult, will see again post op. - Ortho to perform surgery, possibly today. - lactic acid: improving 6.11 >>5.8.  - Pro calcitonin 4.57. - Trend CBC.  ENDOCRINE  A: Hypoglycemia P:  - SSI. - Dextrose PRN. - Trend glucose.  NEUROLOGIC  A: Intermittent acute encephalopathy: in setting of sepsis, improved today P:  - Supportive care   Howard Pouch DO PGY-2 09/08/2013, 8:53 AM  Summary: BP improving. No pressors, off CRT. Will start HD. Continue Broad spectrum abx. Possible right BKA/AKA today, per ortho. Continue IVF for hypotension.  Keep in ICU overnight until post op tomorrow.  CC time 35 min.  Patient seen and examined, agree with above note.  I dictated the care and orders written for this patient under my direction.  Rush Farmer, MD [phone]

## 2013-09-08 NOTE — Progress Notes (Signed)
Hypoglycemic Event  CBG: 65  Treatment: D50 IV 25 mL  Symptoms: None  Follow-up CBG: Time:0506 CBG Result:176  Possible Reasons for Event: Inadequate meal intake and Other: sepsis  Comments/MD notified:Dr Deterding    Felizardo Hoffmann  Remember to initiate Hypoglycemia Order Set & complete

## 2013-09-08 NOTE — Progress Notes (Signed)
cCRITICAL VALUE ALERT  Critical value received:   POTASSIUM 7.7  Date of notification:  09/08/2013  Time of notification:  0115  Critical value read back:yes  Nurse who received alert:  Mick Sell RN  MD notified (1st page):  Clelia Croft NP  Time of first page:  0115  MD notified (2nd page):  Time of second page:  Responding MD:  Clelia Croft NP  Time MD responded:  [phone]

## 2013-09-08 NOTE — Progress Notes (Signed)
eLink Physician-Brief Progress Note Patient Name: Edwin [name] DOB: [birth year] MRN: [id number]  Date of Service  09/08/2013   HPI/Events of Note   Shock   eICU Interventions   Levophed gtt   Intervention Category Major Interventions: Shock - evaluation and management  [name] 09/08/2013, 4:24 PM

## 2013-09-09 ENCOUNTER — Encounter (HOSPITAL_COMMUNITY): Payer: Medicaid Other | Admitting: Certified Registered"

## 2013-09-09 ENCOUNTER — Encounter (HOSPITAL_COMMUNITY): Payer: Self-pay | Admitting: Certified Registered"

## 2013-09-09 ENCOUNTER — Encounter (HOSPITAL_COMMUNITY)
Admission: EM | Disposition: A | Discharge status: Skilled Nursing Facility | Payer: Self-pay | Source: Home / Self Care | Attending: Pulmonary Disease

## 2013-09-09 ENCOUNTER — Inpatient Hospital Stay (HOSPITAL_COMMUNITY): Payer: Medicaid Other | Admitting: Certified Registered"

## 2013-09-09 DIAGNOSIS — J9600 Acute respiratory failure, unspecified whether with hypoxia or hypercapnia: Secondary | ICD-10-CM

## 2013-09-09 DIAGNOSIS — R6521 Severe sepsis with septic shock: Secondary | ICD-10-CM

## 2013-09-09 DIAGNOSIS — A419 Sepsis, unspecified organism: Secondary | ICD-10-CM

## 2013-09-09 DIAGNOSIS — R6520 Severe sepsis without septic shock: Secondary | ICD-10-CM

## 2013-09-09 DIAGNOSIS — E43 Unspecified severe protein-calorie malnutrition: Secondary | ICD-10-CM | POA: Insufficient documentation

## 2013-09-09 DIAGNOSIS — I70269 Atherosclerosis of native arteries of extremities with gangrene, unspecified extremity: Secondary | ICD-10-CM

## 2013-09-09 HISTORY — PX: AMPUTATION: SHX166

## 2013-09-09 LAB — CBC
HCT: 26.4 % — ABNORMAL LOW (ref 39.0–52.0)
HCT: 20.8 % — ABNORMAL LOW (ref 39.0–52.0)
HEMOGLOBIN: 7.2 g/dL — AB (ref 13.0–17.0)
Hemoglobin: 9.2 g/dL — ABNORMAL LOW (ref 13.0–17.0)
MCH: 32.5 pg (ref 26.0–34.0)
MCH: 32.4 pg (ref 26.0–34.0)
MCHC: 34.8 g/dL (ref 30.0–36.0)
MCHC: 34.6 g/dL (ref 30.0–36.0)
MCV: 93.3 fL (ref 78.0–100.0)
MCV: 93.7 fL (ref 78.0–100.0)
Platelets: 264 10*3/uL (ref 150–400)
Platelets: 211 10*3/uL (ref 150–400)
RBC: 2.83 MIL/uL — AB (ref 4.22–5.81)
RBC: 2.22 MIL/uL — AB (ref 4.22–5.81)
RDW: 16.3 % — ABNORMAL HIGH (ref 11.5–15.5)
RDW: 16.5 % — ABNORMAL HIGH (ref 11.5–15.5)
WBC: 17.6 10*3/uL — AB (ref 4.0–10.5)
WBC: 13.9 10*3/uL — ABNORMAL HIGH (ref 4.0–10.5)

## 2013-09-09 LAB — MAGNESIUM
MAGNESIUM: 2.1 mg/dL (ref 1.5–2.5)
Magnesium: 2 mg/dL (ref 1.5–2.5)
Magnesium: 2.1 mg/dL (ref 1.5–2.5)

## 2013-09-09 LAB — POCT I-STAT 3, ART BLOOD GAS (G3+)
Acid-base deficit: 7 mmol/L — ABNORMAL HIGH (ref 0.0–2.0)
BICARBONATE: 20.6 meq/L (ref 20.0–24.0)
O2 Saturation: 99 %
PH ART: 7.23 — AB (ref 7.350–7.450)
PO2 ART: 150 mmHg — AB (ref 80.0–100.0)
Patient temperature: 97.9
TCO2: 22 mmol/L (ref 0–100)
pCO2 arterial: 49 mmHg — ABNORMAL HIGH (ref 35.0–45.0)

## 2013-09-09 LAB — URINE CULTURE
Colony Count: NO GROWTH
Culture: NO GROWTH

## 2013-09-09 LAB — BASIC METABOLIC PANEL
BUN: 84 mg/dL — ABNORMAL HIGH (ref 6–23)
CALCIUM: 7.5 mg/dL — AB (ref 8.4–10.5)
CO2: 19 mEq/L (ref 19–32)
Chloride: 101 mEq/L (ref 96–112)
Creatinine, Ser: 4 mg/dL — ABNORMAL HIGH (ref 0.50–1.35)
GFR calc Af Amer: 18 mL/min — ABNORMAL LOW (ref >90)
GFR calc non Af Amer: 15 mL/min — ABNORMAL LOW (ref >90)
Glucose, Bld: 137 mg/dL — ABNORMAL HIGH (ref 70–99)
Potassium: 5.3 mEq/L (ref 3.7–5.3)
Sodium: 139 mEq/L (ref 137–147)

## 2013-09-09 LAB — GLUCOSE, CAPILLARY
GLUCOSE-CAPILLARY: 121 mg/dL — AB (ref 70–99)
GLUCOSE-CAPILLARY: 148 mg/dL — AB (ref 70–99)
Glucose-Capillary: 111 mg/dL — ABNORMAL HIGH (ref 70–99)
Glucose-Capillary: 134 mg/dL — ABNORMAL HIGH (ref 70–99)
Glucose-Capillary: 87 mg/dL (ref 70–99)
Glucose-Capillary: 91 mg/dL (ref 70–99)

## 2013-09-09 LAB — RENAL FUNCTION PANEL
Albumin: 1.6 g/dL — ABNORMAL LOW (ref 3.5–5.2)
BUN: 82 mg/dL — AB (ref 6–23)
CALCIUM: 7.3 mg/dL — AB (ref 8.4–10.5)
CO2: 21 mEq/L (ref 19–32)
CREATININE: 3.87 mg/dL — AB (ref 0.50–1.35)
Chloride: 97 mEq/L (ref 96–112)
GFR calc Af Amer: 19 mL/min — ABNORMAL LOW (ref >90)
GFR calc non Af Amer: 16 mL/min — ABNORMAL LOW (ref >90)
Glucose, Bld: 126 mg/dL — ABNORMAL HIGH (ref 70–99)
PHOSPHORUS: 7.8 mg/dL — AB (ref 2.3–4.6)
Potassium: 5.3 mEq/L (ref 3.7–5.3)
Sodium: 136 mEq/L — ABNORMAL LOW (ref 137–147)

## 2013-09-09 LAB — PHOSPHORUS
PHOSPHORUS: 8.7 mg/dL — AB (ref 2.3–4.6)
Phosphorus: 9 mg/dL — ABNORMAL HIGH (ref 2.3–4.6)

## 2013-09-09 LAB — PROTIME-INR
INR: 1.37 (ref 0.00–1.49)
PROTHROMBIN TIME: 16.5 s — AB (ref 11.6–15.2)

## 2013-09-09 SURGERY — AMPUTATION, ABOVE KNEE
Anesthesia: General | Site: Leg Upper | Laterality: Right

## 2013-09-09 MED ORDER — FENTANYL CITRATE 0.05 MG/ML IJ SOLN
INTRAMUSCULAR | Status: DC | PRN
  Administered 2013-09-09 (×2): 50 ug via INTRAVENOUS

## 2013-09-09 MED ORDER — OXYCODONE HCL 5 MG PO TABS: 5.0000 mg | ORAL_TABLET | Freq: Four times a day (QID) | ORAL | Status: DC | PRN

## 2013-09-09 MED ORDER — PANTOPRAZOLE SODIUM 40 MG IV SOLR
40.0000 mg | Freq: Two times a day (BID) | INTRAVENOUS | Status: DC
  Administered 2013-09-09 – 2013-09-17 (×16): 40 mg via INTRAVENOUS
  Filled 2013-09-09 (×18): qty 40

## 2013-09-09 MED ORDER — MIDAZOLAM HCL 5 MG/5ML IJ SOLN
INTRAMUSCULAR | Status: DC | PRN
  Administered 2013-09-09: 2 mg via INTRAVENOUS

## 2013-09-09 MED ORDER — VASOPRESSIN 20 UNIT/ML IJ SOLN
0.0300 [IU]/min | Freq: Once | INTRAVENOUS | Status: AC
  Administered 2013-09-09: 0.03 [IU]/min via INTRAVENOUS
  Filled 2013-09-09: qty 2.5

## 2013-09-09 MED ORDER — METOCLOPRAMIDE HCL 5 MG/ML IJ SOLN
5.0000 mg | Freq: Three times a day (TID) | INTRAMUSCULAR | Status: AC
  Administered 2013-09-09 (×2): 5 mg via INTRAVENOUS
  Filled 2013-09-09 (×3): qty 1

## 2013-09-09 MED ORDER — LIDOCAINE HCL (CARDIAC) 20 MG/ML IV SOLN
INTRAVENOUS | Status: DC | PRN
  Administered 2013-09-09: 40 mg via INTRAVENOUS

## 2013-09-09 MED ORDER — VANCOMYCIN HCL IN DEXTROSE 750-5 MG/150ML-% IV SOLN
750.0000 mg | INTRAVENOUS | Status: DC
  Administered 2013-09-09: 750 mg via INTRAVENOUS
  Filled 2013-09-09: qty 150

## 2013-09-09 MED ORDER — 0.9 % SODIUM CHLORIDE (POUR BTL) OPTIME
TOPICAL | Status: DC | PRN
  Administered 2013-09-09: 1000 mL

## 2013-09-09 MED ORDER — VITAL AF 1.2 CAL PO LIQD
1000.0000 mL | ORAL | Status: DC
Start: 2013-09-09 — End: 2013-09-13
  Administered 2013-09-09 – 2013-09-10 (×2): 1000 mL
  Filled 2013-09-09 (×7): qty 1000

## 2013-09-09 MED ORDER — FENTANYL CITRATE 0.05 MG/ML IJ SOLN
INTRAMUSCULAR | Status: AC
  Filled 2013-09-09: qty 5

## 2013-09-09 MED ORDER — ALBUMIN HUMAN 5 % IV SOLN
INTRAVENOUS | Status: DC | PRN
  Administered 2013-09-09: 10:00:00 via INTRAVENOUS

## 2013-09-09 MED ORDER — MIDAZOLAM HCL 2 MG/2ML IJ SOLN
INTRAMUSCULAR | Status: AC
  Filled 2013-09-09: qty 2

## 2013-09-09 MED ORDER — PROPOFOL 10 MG/ML IV BOLUS
INTRAVENOUS | Status: DC | PRN
  Administered 2013-09-09: 90 mg via INTRAVENOUS

## 2013-09-09 MED ORDER — SODIUM CHLORIDE 0.9 % IV SOLN: 250.0000 mL | INTRAVENOUS | Status: DC | PRN

## 2013-09-09 MED ORDER — MAGNESIUM HYDROXIDE 400 MG/5ML PO SUSP
30.0000 mL | Freq: Once | ORAL | Status: AC
  Administered 2013-09-09: 30 mL
  Filled 2013-09-09: qty 30

## 2013-09-09 MED ORDER — PROPOFOL 10 MG/ML IV BOLUS
INTRAVENOUS | Status: AC
  Filled 2013-09-09: qty 20

## 2013-09-09 MED ORDER — PIPERACILLIN-TAZOBACTAM IN DEX 2-0.25 GM/50ML IV SOLN
2.2500 g | Freq: Four times a day (QID) | INTRAVENOUS | Status: DC
  Administered 2013-09-09 – 2013-09-13 (×15): 2.25 g via INTRAVENOUS
  Filled 2013-09-09 (×18): qty 50

## 2013-09-09 MED ORDER — DEXTROSE-NACL 5-0.9 % IV SOLN
INTRAVENOUS | Status: DC
  Administered 2013-09-09: 08:00:00 via INTRAVENOUS

## 2013-09-09 MED ORDER — SODIUM CHLORIDE 0.9 % IV SOLN
INTRAVENOUS | Status: DC
  Administered 2013-09-09 (×2): via INTRAVENOUS

## 2013-09-09 MED ORDER — VECURONIUM BROMIDE 10 MG IV SOLR
INTRAVENOUS | Status: DC | PRN
  Administered 2013-09-09: 2 mg via INTRAVENOUS

## 2013-09-09 MED ORDER — EPINEPHRINE HCL 1 MG/ML IJ SOLN
4000.0000 ug | INTRAVENOUS | Status: DC | PRN
  Administered 2013-09-09: 3 ug/min via INTRAVENOUS

## 2013-09-09 MED ORDER — MIDAZOLAM HCL 2 MG/2ML IJ SOLN
1.0000 mg | INTRAMUSCULAR | Status: DC | PRN
  Administered 2013-09-09 – 2013-09-10 (×2): 2 mg via INTRAVENOUS
  Administered 2013-09-11: 1 mg via INTRAVENOUS
  Filled 2013-09-09 (×3): qty 2

## 2013-09-09 MED ORDER — SODIUM POLYSTYRENE SULFONATE 15 GM/60ML PO SUSP
15.0000 g | Freq: Once | ORAL | Status: AC
  Administered 2013-09-09: 15 g
  Filled 2013-09-09: qty 60

## 2013-09-09 MED ORDER — VASOPRESSIN 20 UNIT/ML IJ SOLN
0.0300 [IU]/min | INTRAVENOUS | Status: DC
  Administered 2013-09-09 – 2013-09-11 (×4): 0.03 [IU]/min via INTRAVENOUS
  Filled 2013-09-09 (×2): qty 2.5

## 2013-09-09 MED ORDER — CHLORHEXIDINE GLUCONATE 0.12 % MT SOLN
15.0000 mL | Freq: Two times a day (BID) | OROMUCOSAL | Status: DC
  Administered 2013-09-09 – 2013-09-21 (×19): 15 mL via OROMUCOSAL
  Filled 2013-09-09 (×26): qty 15

## 2013-09-09 MED ORDER — ETOMIDATE 2 MG/ML IV SOLN
INTRAVENOUS | Status: AC
  Filled 2013-09-09: qty 10

## 2013-09-09 MED ORDER — MORPHINE SULFATE 2 MG/ML IJ SOLN
1.0000 mg | INTRAMUSCULAR | Status: DC | PRN
  Administered 2013-09-09: 2 mg via INTRAVENOUS
  Filled 2013-09-09: qty 1

## 2013-09-09 MED ORDER — SODIUM CHLORIDE 0.9 % IV SOLN
25.0000 ug/h | INTRAVENOUS | Status: DC
  Administered 2013-09-09: 250 ug/h via INTRAVENOUS
  Administered 2013-09-09: 100 ug/h via INTRAVENOUS
  Administered 2013-09-10: 25 ug/h via INTRAVENOUS
  Filled 2013-09-09 (×4): qty 50

## 2013-09-09 MED ORDER — EPINEPHRINE HCL 1 MG/ML IJ SOLN
0.5000 ug/min | INTRAMUSCULAR | Status: DC
  Filled 2013-09-09: qty 4

## 2013-09-09 MED ORDER — DEXTROSE-NACL 5-0.9 % IV SOLN
INTRAVENOUS | Status: DC
  Administered 2013-09-09: 16:00:00 via INTRAVENOUS

## 2013-09-09 MED ORDER — BIOTENE DRY MOUTH MT LIQD
15.0000 mL | Freq: Four times a day (QID) | OROMUCOSAL | Status: DC
  Administered 2013-09-09 – 2013-09-19 (×33): 15 mL via OROMUCOSAL

## 2013-09-09 SURGICAL SUPPLY — 49 items
BANDAGE ELASTIC 4 VELCRO ST LF (GAUZE/BANDAGES/DRESSINGS) ×3 IMPLANT
BANDAGE ELASTIC 6 VELCRO ST LF (GAUZE/BANDAGES/DRESSINGS) ×3 IMPLANT
BANDAGE ESMARK 6X9 LF (GAUZE/BANDAGES/DRESSINGS) IMPLANT
BANDAGE GAUZE ELAST BULKY 4 IN (GAUZE/BANDAGES/DRESSINGS) ×6 IMPLANT
BNDG ESMARK 6X9 LF (GAUZE/BANDAGES/DRESSINGS)
BNDG GAUZE ELAST 4 BULKY (GAUZE/BANDAGES/DRESSINGS) ×3 IMPLANT
CANISTER SUCTION 2500CC (MISCELLANEOUS) ×3 IMPLANT
CLIP LIGATING EXTRA MED SLVR (CLIP) ×3 IMPLANT
CLIP LIGATING EXTRA SM BLUE (MISCELLANEOUS) ×3 IMPLANT
COVER SURGICAL LIGHT HANDLE (MISCELLANEOUS) ×3 IMPLANT
CUFF TOURNIQUET SINGLE 34IN LL (TOURNIQUET CUFF) IMPLANT
CUFF TOURNIQUET SINGLE 44IN (TOURNIQUET CUFF) IMPLANT
DRAIN SNY 10X20 3/4 PERF (WOUND CARE) IMPLANT
DRAPE ORTHO SPLIT 77X108 STRL (DRAPES) ×4
DRAPE PROXIMA HALF (DRAPES) ×3 IMPLANT
DRAPE SURG ORHT 6 SPLT 77X108 (DRAPES) ×2 IMPLANT
ELECT REM PT RETURN 9FT ADLT (ELECTROSURGICAL) ×3
ELECTRODE REM PT RTRN 9FT ADLT (ELECTROSURGICAL) ×1 IMPLANT
EVACUATOR SILICONE 100CC (DRAIN) IMPLANT
GAUZE XEROFORM 5X9 LF (GAUZE/BANDAGES/DRESSINGS) ×3 IMPLANT
GLOVE BIOGEL PI IND STRL 6.5 (GLOVE) ×2 IMPLANT
GLOVE BIOGEL PI INDICATOR 6.5 (GLOVE) ×4
GLOVE ECLIPSE 6.5 STRL STRAW (GLOVE) ×3 IMPLANT
GLOVE SS BIOGEL STRL SZ 7.5 (GLOVE) ×3 IMPLANT
GLOVE SUPERSENSE BIOGEL SZ 7.5 (GLOVE) ×6
GLOVE SURG SS PI 6.5 STRL IVOR (GLOVE) ×3 IMPLANT
GOWN STRL REUS W/ TWL LRG LVL3 (GOWN DISPOSABLE) ×2 IMPLANT
GOWN STRL REUS W/ TWL XL LVL3 (GOWN DISPOSABLE) ×1 IMPLANT
GOWN STRL REUS W/TWL LRG LVL3 (GOWN DISPOSABLE) ×4
GOWN STRL REUS W/TWL XL LVL3 (GOWN DISPOSABLE) ×2
KIT BASIN OR (CUSTOM PROCEDURE TRAY) ×3 IMPLANT
KIT ROOM TURNOVER OR (KITS) ×3 IMPLANT
NS IRRIG 1000ML POUR BTL (IV SOLUTION) ×3 IMPLANT
PACK GENERAL/GYN (CUSTOM PROCEDURE TRAY) ×3 IMPLANT
PAD ARMBOARD 7.5X6 YLW CONV (MISCELLANEOUS) ×6 IMPLANT
PADDING CAST COTTON 6X4 STRL (CAST SUPPLIES) IMPLANT
PENCIL BUTTON HOLSTER BLD 10FT (ELECTRODE) ×3 IMPLANT
SAW GIGLI STERILE 20 (MISCELLANEOUS) ×3 IMPLANT
SPONGE GAUZE 4X4 12PLY (GAUZE/BANDAGES/DRESSINGS) ×3 IMPLANT
STAPLER VISISTAT 35W (STAPLE) ×3 IMPLANT
STOCKINETTE IMPERVIOUS LG (DRAPES) ×3 IMPLANT
SUT ETHILON 3 0 PS 1 (SUTURE) IMPLANT
SUT VIC AB 0 CT1 18XCR BRD 8 (SUTURE) ×2 IMPLANT
SUT VIC AB 0 CT1 8-18 (SUTURE) ×4
SUT VICRYL AB 2 0 TIES (SUTURE) ×3 IMPLANT
TOWEL OR 17X24 6PK STRL BLUE (TOWEL DISPOSABLE) ×6 IMPLANT
TOWEL OR 17X26 10 PK STRL BLUE (TOWEL DISPOSABLE) ×3 IMPLANT
UNDERPAD 30X30 INCONTINENT (UNDERPADS AND DIAPERS) ×3 IMPLANT
WATER STERILE IRR 1000ML POUR (IV SOLUTION) IMPLANT

## 2013-09-09 NOTE — Progress Notes (Signed)
ANTIBIOTIC CONSULT NOTE - FOLLOW UP  Pharmacy Consult:  Vancomycin / Zosyn Indication:  Rule out sepsis  Allergies  Allergen Reactions  . Codeine Nausea Only    Patient Measurements: Height: 5\' 9"  (175.3 cm) Weight: 121 lb 4.1 oz (55 kg) IBW/kg (Calculated) : 70.7  Vital Signs: Temp: 98.4 F (36.9 C) (02/13 0826) BP: 119/46 mmHg (02/13 1118) Pulse Rate: 98 (02/13 1118) Intake/Output from previous day: 02/12 0701 - 02/13 0700 In: 3785.4 [P.O.:450; I.V.:3135.4; IV Piggyback:200] Out: 965 [Urine:728]  Labs:  Recent Labs  09/08/13 0020 09/08/13 0110 09/08/13 0225  09/08/13 0805 09/08/13 1637 09/08/13 2010 09/09/13 0339  WBC 18.3*  --  14.1*  --   --  11.5*  --  17.6*  HGB 10.0*  --  8.7*  --   --  8.6*  --  9.2*  PLT 335  --  291  --   --  244  --  264  LABCREA  --  39.04  --   --   --   --   --   --   CREATININE 5.48*  --   --   < > 3.93*  --  3.80* 3.87*  < > = values in this interval not displayed. Estimated Creatinine Clearance: 16.6 ml/min (by C-G formula based on Cr of 3.87). No results found for this basename: VANCOTROUGH, VANCOPEAK, VANCORANDOM, GENTTROUGH, GENTPEAK, GENTRANDOM, TOBRATROUGH, TOBRAPEAK, TOBRARND, AMIKACINPEAK, AMIKACINTROU, AMIKACIN,  in the last 72 hours     Assessment: 59 YOM with history of gangrenous non-healing RLE ulcer admitted with sepsis  on vancomycin, clindamycin and Zosyn.  Patient noted with septic shock and ARF and noted s/p R AKA today. WBC= 17.6, SCr= 3.87, CrCl ~ 16 (UOP 0.6 ml/kg/hr). Noted no plans for HD now.  Vanc 2/11 >> Zosyn 2/11 >> Clinda 2/12 >>  2/12 BCx - ngtd 2/12 UCx - ng 2/12 c diff neg    Goal of Therapy:  Vancomycin trough level 15-20 mcg/ml   Plan:  -Change zosyn to 2.25gm IV q6h -Vancomycin 750 mg IV Q48hr -Will follow renal function, cultures and clinical progress  Hildred Laser, Pharm D 09/09/2013 12:15 PM

## 2013-09-09 NOTE — Progress Notes (Signed)
Numerous conversations with Daughter today pertaining to her concerns of how care will be provided for her father in the future.  Daughter reports she is the only one in her fathers life.  She also reports he will be without a home in 10 days when his rent runs out at a local hotel.  Daughter reports that up until 2 weeks ago she has not seen her father for 5 years and going home with her is not an option.  She is open to discussing palliative care options and wants to discuss what her fathers options are going to be at discharge.  Daughter reports wanting to do what she can to help and has concerns over future  medical problems her father will have to deal with.   Richardean Canal RN, BSN, CCRN

## 2013-09-09 NOTE — Interval H&P Note (Signed)
History and Physical Interval Note:  09/09/2013 9:00 AM  Edwin [name]  has presented today for surgery, with the diagnosis of Peripheral Vascular Disease with Gangrene Right Foot  The various methods of treatment have been discussed with the patient and family. After consideration of risks, benefits and other options for treatment, the patient has consented to  Procedure(s): AMPUTATION ABOVE KNEE (Right) as a surgical intervention .  The patient's history has been reviewed, patient examined, no change in status, stable for surgery.  I have reviewed the patient's chart and labs.  Questions were answered to the patient's satisfaction.     Daltin Crist

## 2013-09-09 NOTE — Anesthesia Procedure Notes (Addendum)
Performed by: Manuela Schwartz B   Procedure Name: Intubation Date/Time: 09/09/2013 9:44 AM Performed by: Manuela Schwartz B Pre-anesthesia Checklist: Patient identified, Suction available, Emergency Drugs available, Patient being monitored and Timeout performed Patient Re-evaluated:Patient Re-evaluated prior to inductionOxygen Delivery Method: Circle system utilized Preoxygenation: Pre-oxygenation with 100% oxygen Intubation Type: IV induction Ventilation: Mask ventilation without difficulty Laryngoscope Size: Mac and 3 Tube type: Oral Tube size: 7.5 mm Number of attempts: 1 Airway Equipment and Method: Stylet Placement Confirmation: ETT inserted through vocal cords under direct vision,  positive ETCO2 and breath sounds checked- equal and bilateral Secured at: 22 cm Tube secured with: Tape Dental Injury: Teeth and Oropharynx as per pre-operative assessment

## 2013-09-09 NOTE — Op Note (Signed)
    OPERATIVE REPORT  DATE OF SURGERY: 09/09/2013  PATIENT: Edwin [name], 58 y.o. male MRN: [id number]  DOB: Dec 08, 1955  PRE-OPERATIVE DIAGNOSIS: Gangrene right foot  POST-OPERATIVE DIAGNOSIS:  Same  PROCEDURE: Right below-knee amputation  SURGEON:  Curt Jews, M.D.  PHYSICIAN ASSISTANT: Nurse  ANESTHESIA:  Gen.  EBL: Less than 100 ml  Total I/O In: 973.5 [I.V.:723.5; IV Piggyback:250] Out: 200 [Blood:200]  BLOOD ADMINISTERED: None  DRAINS: None  SPECIMEN: Right above-knee amputation  COUNTS CORRECT:  YES  PLAN OF CARE: PACU   PATIENT DISPOSITION:  PACU - hemodynamically stable  PROCEDURE DETAILS: Patient is taken to the operating room for right above-knee dictation. He had severe sepsis and had a blood pressure 80 mmHg on significant inotropic support. The right leg was prepped in the usual sterile fashion. A fishmouth type incision was made over the distal thigh above the knee and carried down through the subcutaneous tissue with electrocautery. The muscle was viable and there was no evidence of infection at this level. The dissection was taken down to the level of the femur. The superficial femoral artery and superficial femoral veins were ligated with 0 Vicryl ties and divided. The sciatic nerve was mobilized proximally and was ligated and divided. Other bleeding vessels were controlled and a the periosteum was mobilized proximally on the femur and the femur was divided with a Gigli saw. The bone edges were trimmed with a bone rasp. Once the edges were smoothed the wound is irrigated with saline and hemostasis was obtained with electrocautery. The anterior fascia was closed to the posterior fascia with 0 Vicryl figure-of-eight sutures. The wound again was irrigated. The skin was closed with skin staples. Xeroform gauze 4 x 4's Curlex and a six-inch Ace wrap were applied. The patient was taken to the recovery in stable condition.   Curt Jews, M.D. 09/09/2013 11:33  AM

## 2013-09-09 NOTE — Progress Notes (Addendum)
INITIAL NUTRITION ASSESSMENT  DOCUMENTATION CODES Per approved criteria  -Severe malnutrition in the context of chronic illness -Underweight   INTERVENTION:  Utilize 27M PEPuP Protocol: initiate TF via OGT with Vital AF 1.2 at 25 ml/h on day 1; on day 2, increase to goal rate of 55 ml/h (1320 ml per day) to provide 1584 kcals, 99 gm protein, 1071 ml free water daily. Monitor magnesium, potassium, and phosphorus daily for at least 3 days, MD to replete as needed, as pt is at risk for refeeding syndrome given severe PCM.  NUTRITION DIAGNOSIS: Inadequate oral intake  related to inability to eat as evidenced by NPO status.   Goal: Intake to meet >90% of estimated nutrition needs.  Monitor:  TF tolerance/adequacy, weight trend, labs, vent status.  Reason for Assessment: MD Consult for TF initiation and management.  58 y.o. male  Admitting Dx: Gangrene of lower extremity; Sepsis  ASSESSMENT: 58 year old male w/ severe PAD. Admitted on 2/12 w/ severe sepsis/ septic shock, acute renal failure, metabolic acidosis and hyperkalemia in setting of gangrenous non-healing RLE ulcer.   S/P R AKA 2/13. Returned to the ICU on a vent. Per review of chart patient's daughter reports no PO intake over the past several weeks and chronic diarrhea. She states that he does not ambulate at all. Patient with multiple pressure ulcers/areas of skin breakdown. Needs increased protein to support healing.  Pt meets criteria for severe MALNUTRITION in the context of chronic illness as evidenced by severe depletion of muscle mass and 14% weight loss in the past month.  Patient is currently intubated on ventilator support.  MV: 9.3 L/min Temp (24hrs), Avg:98.3 F (36.8 C), Min:97.1 F (36.2 C), Max:99.2 F (37.3 C)   Nutrition Focused Physical Exam:  Subcutaneous Fat:  Orbital Region: moderate depletion Upper Arm Region: moderate depletion Thoracic and Lumbar Region: NA  Muscle:  Temple Region: severe  depletion Clavicle Bone Region: severe depletion Clavicle and Acromion Bone Region: severe depletion Scapular Bone Region: NA Dorsal Hand: NA Patellar Region: severe depletion Anterior Thigh Region: severe depletion Posterior Calf Region: severe depletion  Edema: none   Height: Ht Readings from Last 1 Encounters:  09/08/13 5\' 9"  (1.753 m)    Weight: Wt Readings from Last 1 Encounters:  09/09/13 121 lb 4.1 oz (55 kg)  (pre-amputation weight)  Ideal Body Weight: 68 kg (adjusted for BKA)  % Ideal Body Weight: 76%  Wt Readings from Last 10 Encounters:  09/09/13 121 lb 4.1 oz (55 kg)  09/09/13 121 lb 4.1 oz (55 kg)  09/09/13 121 lb 4.1 oz (55 kg)  08/16/13 160 lb (72.576 kg)  08/16/13 160 lb (72.576 kg)  03/22/13 150 lb 6.4 oz (68.221 kg)  03/02/13 155 lb (70.308 kg)  03/02/13 155 lb (70.308 kg)  02/23/13 155 lb (70.308 kg)  02/23/13 155 lb (70.308 kg)    Usual Body Weight: 160 lb  % Usual Body Weight: 76%  BMI:  Body mass index is 17.9 kg/(m^2). underweight  Estimated Nutritional Needs: Kcal: 1625 Protein: 90-105 gm Fluid: 1.8 L  Skin: unstageable pressure ulcer to left thigh; stage 3 pressure ulcer to sacrum, unstageable pressure ulcer to right buttock, unstageable pressure ulcer right knee, unstageable pressure ulcer left knee, unstageable pressure ulcer sacrum, right leg BKA  Diet Order: NPO  EDUCATION NEEDS: -Education not appropriate at this time   Intake/Output Summary (Last 24 hours) at 09/09/13 1153 Last data filed at 09/09/13 1100  Gross per 24 hour  Intake 4358.94 ml  Output    900 ml  Net 3458.94 ml    Last BM: 2/11   Labs:   Recent Labs Lab 09/08/13 0020 09/08/13 0500 09/08/13 0805 09/08/13 2010 09/09/13 0339  NA 131* 139 140 141 136*  K 7.7* 5.0 5.8* 5.1 5.3  CL 87* 96 98 100 97  CO2 19 24 22 20 21   BUN 106* 80* 80* 81* 82*  CREATININE 5.48* 3.97* 3.93* 3.80* 3.87*  CALCIUM 8.5 7.6* 8.0* 7.3* 7.3*  MG 2.9*  --  2.3  --  2.1   PHOS 9.3* 6.3*  --   --  7.8*  GLUCOSE 108* 111* 92 126* 126*    CBG (last 3)   Recent Labs  09/08/13 2336 09/09/13 0432 09/09/13 0745  GLUCAP 134* 121* 91    Scheduled Meds: . clindamycin (CLEOCIN) IV  600 mg Intravenous 4 times per day  . collagenase   Topical Daily  . insulin aspart  0-9 Units Subcutaneous 6 times per day  . magnesium hydroxide  30 mL Per Tube Once  . metoCLOPramide (REGLAN) injection  5 mg Intravenous 3 times per day  . pantoprazole (PROTONIX) IV  40 mg Intravenous QHS  . piperacillin-tazobactam (ZOSYN)  IV  2.25 g Intravenous 3 times per day  . sodium polystyrene  15 g Per Tube Once    Continuous Infusions: . sodium chloride 50 mL/hr at 09/09/13 0841  . fentaNYL infusion INTRAVENOUS    . norepinephrine (LEVOPHED) Adult infusion 20 mcg/min (09/09/13 1020)  . norepinephrine (LEVOPHED) Adult infusion Stopped (09/08/13 2200)    Past Medical History  Diagnosis Date  . COPD 11/27/2007    Qualifier: Diagnosis of  By: Garen Grams    . ERECTILE DYSFUNCTION 11/27/2007    Qualifier: Diagnosis of  By: Garen Grams    . HYPERLIPIDEMIA 11/27/2007    Qualifier: Diagnosis of  By: Garen Grams    . HYPERTENSION, BENIGN 05/23/2009    Qualifier: Diagnosis of  By: Melvyn Novas MD, Christena Deem   . Coronary artery disease     Cardiac catheterization in 2008 showed 99% stenosis in proximal left circumflex which was treated with Taxus drug-eluting stent. There was mild RCA and LAD disease with normal ejection fraction.  Marland Kitchen PAD (peripheral artery disease)     Previous right SFA stent in 2003. Directional atherectomy right SFA in 01/2013  . Tobacco use   . CHF (congestive heart failure)   . Hypertension   . Asthma     Past Surgical History  Procedure Laterality Date  . Nasal sinus surgery  2004  . Acne cyst removal    . Other surgical history      blocked artery  . Endarterectomy Left 08/16/2013    Procedure: Exploration of Left Neck/Fix Bleeding;  Surgeon: Elam Dutch, MD;  Location: [facility] OR;  Service: Vascular;  Laterality: Left;  . Esophagogastroduodenoscopy N/A 09/08/2013    Procedure: ESOPHAGOGASTRODUODENOSCOPY (EGD);  Surgeon: Cleotis Nipper, MD;  Location: [facility] ENDOSCOPY;  Service: Endoscopy;  Laterality: N/A;  . Flexible sigmoidoscopy N/A 09/08/2013    Procedure: FLEXIBLE SIGMOIDOSCOPY;  Surgeon: Cleotis Nipper, MD;  Location: [facility] ENDOSCOPY;  Service: Endoscopy;  Laterality: N/A;  unprepp    Molli Barrows, RD, LDN, [place] Pager [phone] After Hours Pager [phone]

## 2013-09-09 NOTE — Progress Notes (Signed)
Subjective:  Events noted. Ended up getting EGD and flex sig- no active bleeding- hgb stable.  He became more hypotensive overnight, now on pressors- is making urine, actually 300 in the last 4 hours.  Renal function stable, K is just a little high  Objective Vital signs in last 24 hours: Filed Vitals:   09/09/13 0400 09/09/13 0500 09/09/13 0600 09/09/13 0700  BP: 90/57 116/89 89/68 102/63  Pulse: 94 97 95 94  Temp: 98.3 F (36.8 C) 98.3 F (36.8 C) 98.4 F (36.9 C) 98.3 F (36.8 C)  TempSrc:      Resp: 17 21 20 19   Height:      Weight:      SpO2: 99% 98% 100% 100%   Weight change: 2.6 kg (5 lb 11.7 oz)  Intake/Output Summary (Last 24 hours) at 09/09/13 0756 Last data filed at 09/09/13 0700  Gross per 24 hour  Intake 3785.44 ml  Output    965 ml  Net 2820.44 ml    Assessment/ Plan: Pt is a 58 y.o. yo male who was admitted on 09/07/2013 with AKI in the setting of naprosyn/ACE-I and sepsis related to gangrenous foot  Assessment/Plan: 1. AKI- in the setting of above.  Required brief CRRT for hyperkalemia- taken off yesterday AM- is making urine , creatinine is staying stable.  He required the initiation of pressors overnight but I feel like patient may be a little dry.  He is getting 1/2 normal saline and sodium has gone down a little, I will change over to NS and would give liberally as it may ? help with pressor need.  He had normal renal function less than month ago so am hopeful for eventual recovery but will watch in the meantime for dialysis need.  2. Gangrene- for BKA today clinda/zosyn 3. Anemia- no obvious bleeding with invasive testing- supportive care 4. HTN/volume- I think maybe dry- would give fluids liberally  5. Hyperkalemia- stable for now- hoping that wont be an indication for HD- continue to attempt to treat medically.  Will check K in PM and treat as needed   [place]: Basic Metabolic Panel:  Recent Labs Lab 09/08/13 0020  09/08/13 0500 09/08/13 0805 09/08/13 2010 09/09/13 0339  NA 131* 139 140 141 136*  K 7.7* 5.0 5.8* 5.1 5.3  CL 87* 96 98 100 97  CO2 19 24 22 20 21   GLUCOSE 108* 111* 92 126* 126*  BUN 106* 80* 80* 81* 82*  CREATININE 5.48* 3.97* 3.93* 3.80* 3.87*  CALCIUM 8.5 7.6* 8.0* 7.3* 7.3*  PHOS 9.3* 6.3*  --   --  7.8*   Liver Function Tests:  Recent Labs Lab 09/07/13 2047 09/08/13 0500 09/09/13 0339  AST 32  --   --   ALT 17  --   --   ALKPHOS 140*  --   --   BILITOT 0.6  --   --   PROT 7.4  --   --   ALBUMIN 2.5* 2.0* 1.6*    Recent Labs Lab 09/07/13 2047  LIPASE 127*   No results found for this basename: AMMONIA,  in the last 168 hours CBC:  Recent Labs Lab 09/07/13 2047 09/08/13 0020 09/08/13 0225 09/08/13 1637 09/09/13 0339  WBC 33.5* 18.3* 14.1* 11.5* 17.6*  NEUTROABS 30.8*  --   --   --   --   HGB 8.0* 10.0* 8.7* 8.6* 9.2*  HCT 23.3* 29.4* 24.9* 25.6* 26.4*  MCV 92.8 92.5 90.9 93.4  93.3  PLT 529* 335 291 244 264   Cardiac Enzymes:  Recent Labs Lab 09/08/13 0020 09/08/13 0225 09/08/13 1116  CKTOTAL 47  --   --   TROPONINI <0.30 <0.30 <0.30   CBG:  Recent Labs Lab 09/08/13 1544 09/08/13 1607 09/08/13 1901 09/08/13 2336 09/09/13 0432  GLUCAP 32* 121* 97 134* 121*    Iron Studies: No results found for this basename: IRON, TIBC, TRANSFERRIN, FERRITIN,  in the last 72 hours Studies/Results: Dg Chest Port 1 View  09/08/2013   CLINICAL DATA:  HD catheter placement  EXAM: PORTABLE CHEST - 1 VIEW  COMPARISON:  September 07, 2013 9:12 p.m.  FINDINGS: The heart size and mediastinal contours are within normal limits. Right central line is identified with distal tip in the superior vena cava. There is no pneumothorax. There is no focal infiltrate, pulmonary edema, or pleural effusion. The visualized skeletal structures are stable.  IMPRESSION: Right jugular HD catheter is identified with the distal tip in the superior vena cava. There is no pneumothorax.    Electronically Signed   By: Abelardo Diesel M.D.   On: 09/08/2013 02:32   Dg Chest Portable 1 View  09/07/2013   CLINICAL DATA:  Shortness of breath  EXAM: PORTABLE CHEST - 1 VIEW  COMPARISON:  August 05, 2012  FINDINGS: The heart size and mediastinal contours are within normal limits. There is no focal infiltrate, pulmonary edema, or pleural effusion. In the lateral left seventh rib, there is mild cortical discontinuity suspicious for acute fracture. Chronic posttraumatic change of the lateral left eighth and ninth ribs are noted.  IMPRESSION: No focal infiltrate identified. Fracture of the lateral left seventh rib.   Electronically Signed   By: Abelardo Diesel M.D.   On: 09/07/2013 21:28   Dg Tibia/fibula Right Port  09/08/2013   CLINICAL DATA:  Osteomyelitis.  Open wound on the right leg.  EXAM: PORTABLE RIGHT TIBIA AND FIBULA - 2 VIEW  COMPARISON:  Ankle film today.  FINDINGS: There is no plain film evidence of osteomyelitis. High density material is present in the soft tissues of the mid to distal right leg, suggesting radiopaque dressing material. There is no osteolysis of the tibia or fibula. Small vessel atherosclerosis. Irregularity of the distal soft tissues of the leg, suggesting ulceration.  IMPRESSION: No plain film evidence of osteomyelitis.   Electronically Signed   By: Dereck Ligas M.D.   On: 09/08/2013 00:23   Dg Ankle Right Port  09/08/2013   CLINICAL DATA:  Osteomyelitis.  Open wound on the right leg.  EXAM: PORTABLE RIGHT ANKLE - 2 VIEW  COMPARISON:  None.  FINDINGS: Osteopenia is present. There is soft tissue irregularity in the distal posterior leg. No cortical osteolysis or rarefaction of bone to suggest osseous infection. The ankle mortise is congruent. The alignment of the ankle is anatomic. Suggestion of pes planus.  IMPRESSION: No acute osseous injury. No plain film evidence of osteomyelitis. Soft tissue irregularity along the dorsal distal leg, compatible with large ulceration.    Electronically Signed   By: Dereck Ligas M.D.   On: 09/08/2013 00:22   Medications: Infusions: . sodium chloride 20 mL/hr at 09/09/13 0700  . dextrose 5 % and 0.45% NaCl 75 mL/hr at 09/09/13 0700  . norepinephrine (LEVOPHED) Adult infusion 24.96 mcg/min (09/09/13 0700)  . norepinephrine (LEVOPHED) Adult infusion Stopped (09/08/13 2200)    Scheduled Medications: . clindamycin (CLEOCIN) IV  600 mg Intravenous 4 times per day  . collagenase  Topical Daily  . insulin aspart  0-9 Units Subcutaneous 6 times per day  . pantoprazole (PROTONIX) IV  40 mg Intravenous QHS  . piperacillin-tazobactam (ZOSYN)  IV  2.25 g Intravenous 3 times per day    have reviewed scheduled and prn medications.  Physical Exam: General: resting, seems more comfortable today Heart: tachy Lungs: mostly clear Abdomen: soft, tender epigastric area Extremities: no edema- ischemic foot Dialysis Access: right  IJ temp cath- placed 2/12   09/09/2013,7:56 AM  LOS: 2 days

## 2013-09-09 NOTE — Transfer of Care (Signed)
Immediate Anesthesia Transfer of Care Note  Patient: Edwin [name]  Procedure(s) Performed: Procedure(s): AMPUTATION ABOVE KNEE (Right)  Patient Location: ICU 2103  Anesthesia Type:General  Level of Consciousness: Patient remains intubated per anesthesia plan  Airway & Oxygen Therapy: Patient remains intubated per anesthesia plan and Patient placed on Ventilator (see vital sign flow sheet for setting)  Post-op Assessment: Post -op Vital signs reviewed and stable  Post vital signs: Reviewed and stable  Complications: No apparent anesthesia complications
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
## 2013-09-09 NOTE — OR Nursing (Signed)
09/09/2013 @ 1035 Transesophageal echocardiogram by Dr Ermalene Postin.

## 2013-09-09 NOTE — Progress Notes (Signed)
Name: Edwin [name] MRN: [id number] DOB: [birth year]    ADMISSION DATE: 09/07/2013  PRIMARY SERVICE: pccm  CHIEF COMPLAINT: Sepsis   BRIEF PATIENT DESCRIPTION:  58 year old male w/ severe PAD. Admitted on 2/12 w/ severe sepsis/ septic shock, acute renal failure, metabolic acidosis and hyperkalemia in setting of gangrenous non-healing RLE ulcer.   Daughter notes that he has had episodes of confusion and hallucinations over the last few days, and recently noted that the posterior aspect of his LE wound has had more discharge. PT presented to ER 2/12 w/ CC Shortness of breath. CXR was clear. Wound on RLE gangrenous w/ fouls smelling d/c, dx eval showed acute renal failure, severe metabolic acidosis and hyperkalemia. Developed widened QRS in ED, required emergent rx.   SIGNIFICANT EVENTS / STUDIES:  Right Tib/Fib 2/12>>> No osteo Right ankle 2/12>>> No acute process CXR 2/12: Left 7th rib fracture CRT 2/12>> 2/12 stopped Endoscopy/flexsig 2/12: No active bleed, large gastric residual, multiple shallow gastric ulcers without hemorrhage. ?nonsteroidal gastropathy vs gastric mucosal ischemia.  2/12 C.Diff neg 2/13 Surgery  LINES / TUBES:  Foley cath 2/12>> RIJ 2/12>> PIV 2/12>> ET tube 2/13>>> R radial a-line 2/13>>>  CULTURES:  BCX2 2/12>>> pending UC 2/12>>> pending  ANTIBIOTICS:  vanc 2/12>>> 2/12 Zosyn 2/12>>>  clinda 2/12>>>   SUBJECTIVE: Sedated and intubated post op.  VITAL SIGNS: Temp:  [96.9 F (36.1 C)-99.2 F (37.3 C)] 98.3 F (36.8 C) (02/13 0700) Pulse Rate:  [46-126] 94 (02/13 0700) Resp:  [16-30] 19 (02/13 0700) BP: (71-128)/(37-89) 102/63 mmHg (02/13 0700) SpO2:  [60 %-100 %] 100 % (02/13 0700) FiO2 (%):  [100 %] 100 % (02/13 0200) Weight:  [121 lb 4.1 oz (55 kg)] 121 lb 4.1 oz (55 kg) (02/13 0157) HEMODYNAMICS:   VENTILATOR SETTINGS: Vent Mode:  [-]  FiO2 (%):  [100 %] 100 % INTAKE / OUTPUT: Intake/Output     02/12 0701 - 02/13 0700 02/13 0701  - 02/14 0700   P.O. 450    I.V. (mL/kg) 3135.4 (57)    IV Piggyback 150    Total Intake(mL/kg) 3735.4 (67.9)    Urine (mL/kg/hr) 728 (0.6)    Other 237 (0.2)    Total Output 965     Net +2770.4           PHYSICAL EXAMINATION: General: Chronically ill appearing white male, sedated and intubated. Neuro: Sedated, intubated and paralyzed post op. HEENT: Poor dentition  Cardiovascular: rrr  Lungs: wheezing Right bibasilar, Coarse rhonchi.  Abdomen: Soft, non-tender  Musculoskeletal: Intact  Skin: Non-stageable sacral and gluteal area wound, also large gangrenous non-healing wound on right lower extremity w/ foul smelling purulent Discharge, as well as visible tendon on posterior aspect of wound pre-op now right leg with BKA.  LABS:  CBC  Recent Labs Lab 09/08/13 0225 09/08/13 1637 09/09/13 0339  WBC 14.1* 11.5* 17.6*  HGB 8.7* 8.6* 9.2*  HCT 24.9* 25.6* 26.4*  PLT 291 244 264   Coag's  Recent Labs Lab 09/08/13 0020 09/09/13 0339  INR 1.31 1.37   BMET  Recent Labs Lab 09/08/13 0805 09/08/13 2010 09/09/13 0339  NA 140 141 136*  K 5.8* 5.1 5.3  CL 98 100 97  CO2 22 20 21   BUN 80* 81* 82*  CREATININE 3.93* 3.80* 3.87*  GLUCOSE 92 126* 126*   Electrolytes  Recent Labs Lab 09/08/13 0020 09/08/13 0500 09/08/13 0805 09/08/13 2010 09/09/13 0339  CALCIUM 8.5 7.6* 8.0* 7.3* 7.3*  MG 2.9*  --  2.3  --  2.1  PHOS 9.3* 6.3*  --   --  7.8*   Sepsis Markers  Recent Labs Lab 09/07/13 2149 09/08/13 0020  LATICACIDVEN 6.11* 5.8*  PROCALCITON  --  4.57   ABG  Recent Labs Lab 09/07/13 2231 09/08/13 0559  PHART 7.406 7.474*  PCO2ART 30.0* 32.9*  PO2ART 97.0 51.1*   Liver Enzymes  Recent Labs Lab 09/07/13 2047 09/08/13 0500 09/09/13 0339  AST 32  --   --   ALT 17  --   --   ALKPHOS 140*  --   --   BILITOT 0.6  --   --   ALBUMIN 2.5* 2.0* 1.6*   Cardiac Enzymes  Recent Labs Lab 09/08/13 0020 09/08/13 0225 09/08/13 1116  TROPONINI <0.30  <0.30 <0.30   Glucose  Recent Labs Lab 09/08/13 1206 09/08/13 1544 09/08/13 1607 09/08/13 1901 09/08/13 2336 09/09/13 0432  GLUCAP 92 32* 121* 97 134* 121*    Imaging Dg Chest Port 1 View  09/08/2013   CLINICAL DATA:  HD catheter placement  EXAM: PORTABLE CHEST - 1 VIEW  COMPARISON:  September 07, 2013 9:12 p.m.  FINDINGS: The heart size and mediastinal contours are within normal limits. Right central line is identified with distal tip in the superior vena cava. There is no pneumothorax. There is no focal infiltrate, pulmonary edema, or pleural effusion. The visualized skeletal structures are stable.  IMPRESSION: Right jugular HD catheter is identified with the distal tip in the superior vena cava. There is no pneumothorax.   Electronically Signed   By: Abelardo Diesel M.D.   On: 09/08/2013 02:32   Dg Chest Portable 1 View  09/07/2013   CLINICAL DATA:  Shortness of breath  EXAM: PORTABLE CHEST - 1 VIEW  COMPARISON:  August 05, 2012  FINDINGS: The heart size and mediastinal contours are within normal limits. There is no focal infiltrate, pulmonary edema, or pleural effusion. In the lateral left seventh rib, there is mild cortical discontinuity suspicious for acute fracture. Chronic posttraumatic change of the lateral left eighth and ninth ribs are noted.  IMPRESSION: No focal infiltrate identified. Fracture of the lateral left seventh rib.   Electronically Signed   By: Abelardo Diesel M.D.   On: 09/07/2013 21:28   Dg Tibia/fibula Right Port  09/08/2013   CLINICAL DATA:  Osteomyelitis.  Open wound on the right leg.  EXAM: PORTABLE RIGHT TIBIA AND FIBULA - 2 VIEW  COMPARISON:  Ankle film today.  FINDINGS: There is no plain film evidence of osteomyelitis. High density material is present in the soft tissues of the mid to distal right leg, suggesting radiopaque dressing material. There is no osteolysis of the tibia or fibula. Small vessel atherosclerosis. Irregularity of the distal soft tissues of the  leg, suggesting ulceration.  IMPRESSION: No plain film evidence of osteomyelitis.   Electronically Signed   By: Dereck Ligas M.D.   On: 09/08/2013 00:23   Dg Ankle Right Port  09/08/2013   CLINICAL DATA:  Osteomyelitis.  Open wound on the right leg.  EXAM: PORTABLE RIGHT ANKLE - 2 VIEW  COMPARISON:  None.  FINDINGS: Osteopenia is present. There is soft tissue irregularity in the distal posterior leg. No cortical osteolysis or rarefaction of bone to suggest osseous infection. The ankle mortise is congruent. The alignment of the ankle is anatomic. Suggestion of pes planus.  IMPRESSION: No acute osseous injury. No plain film evidence of osteomyelitis. Soft tissue irregularity along the dorsal distal leg, compatible with  large ulceration.   Electronically Signed   By: Dereck Ligas M.D.   On: 09/08/2013 00:22   CXR: Fx Left 7 th rib, otherwise no acute process  ASSESSMENT / PLAN:  PULMONARY  A: COPD  Dyspnea in setting of acidosis  P:  - Maintain on full vent support for now. - CXR and ABG now. - Vent settings established.  CARDIOVASCULAR  A: septic shock  Severe PAD, recent right arthrectomy for known severe PAD of right SFA (aug 2014)   HTN: BP soft initially P:  - MAP goal >65. Started on LEVO last night  - Holding nephrotoxic agents. - Trop negative x3.  - Change IVF: NS 75 ml/h - Follow CVP. - Currently on levo at 20, epi at 3 and vaso at 0.03, will attempt to wean off epi.  RENAL  A:  Acute renal failure: dehydration, sepsis, ace-I, and diuretics, NSAIDs  + anion gap acidosis:sepsis, lactic acidosis  Hyperkalemia: acidosis and KCL supplementation  prob rhabdo  P:  - Hold antihypertensives, diuretics and KCl. - Monitor renal function; Cr 5.48 > 3.97> 3.87, Normal baseline. - Potassium >5 today, phos 7.8 (Both increasing).  - Anion GAP 18, Delta gap 6; Resp alkalosis with metabolic acidosis. - UOP: 700/24h (positive ~3L/24h) - Renal consult appreciated. - Kayexalate and  MOM ordered x1.  GASTROINTESTINAL  A: ? GERD P:  - PPI  - S/P endoscopy/flex sig yesterday. No active bleed noted.  - NG suction for large gastric residual. ? Gastric outlet obstruction - Sucralfate suspension for gastric mucosal ulceration ? Poss ischemia.  - NPO, gastric residual and surgery today - GI signed off - Reglan x2 doses ordered.  HEMATOLOGIC  A: Anemia. No evidence of bleeding. Likely AOCD, ? GI bleed P:  - Hb stable. Trend CBC. INR 1.37 - [place] heparin stopped yesterday d/t BRBPR  INFECTIOUS  A: RLE gangrene of lower extremity  Severe sepsis  P:  - Zosyn/clinda. WBC increasing - Cultures pending (blood, stool) - Urine culture NGTD - C.diff neg - Wound care consult, will see again post op. - Trend CBC. - R BKA done.  ENDOCRINE  A: Hypoglycemia P:  - SSI. - Dextrose PRN. - Trend glucose. - Start TF.  NEUROLOGIC  A: Intermittent acute encephalopathy: in setting of sepsis, improved today P:  - Supportive care  - Versed PRN/Fentanyl drip.   Howard Pouch DO PGY-2 09/09/2013, 7:50 AM  Summary: Continue full vent support specially that patient is hemodynamically unstable.  Sedation as above, F/U CXR and ABG, attempt to get epi off.  Will likely need dialysis but given lifestyle would not be a good candidate, will defer to renal.  CC time 35 min.  Patient seen and examined, agree with above note.  I dictated the care and orders written for this patient under my direction.  Rush Farmer, M.D. [facility] Pulmonary/Critical Care Medicine. Pager: [phone]. After hours pager: [phone].

## 2013-09-09 NOTE — Anesthesia Postprocedure Evaluation (Signed)
  Anesthesia Post-op Note  Patient: Edwin [name]  Procedure(s) Performed: Procedure(s): AMPUTATION ABOVE KNEE (Right)  Patient Location: ICU  Anesthesia Type:General  Level of Consciousness: Patient remains intubated per anesthesia plan  Airway and Oxygen Therapy: Patient remains intubated per anesthesia plan  Post-op Pain: none  Post-op Assessment: Post-op Vital signs reviewed, Patient's Cardiovascular Status Stable, Respiratory Function Stable and Pain level controlled  Post-op Vital Signs: Reviewed and stable  Complications: No apparent anesthesia complications

## 2013-09-09 NOTE — Anesthesia Preprocedure Evaluation (Addendum)
Anesthesia Evaluation  Patient identified by MRN, date of birth, ID band Patient awake    Reviewed: Allergy & Precautions, H&P , NPO status , Patient's Chart, lab work & pertinent test results  Airway       Dental   Pulmonary asthma , COPDCurrent Smoker,          Cardiovascular hypertension, + CAD, + Peripheral Vascular Disease and +CHF     Neuro/Psych    GI/Hepatic   Endo/Other    Renal/GU Renal disease     Musculoskeletal   Abdominal   Peds  Hematology  (+) anemia ,   Anesthesia Other Findings   Reproductive/Obstetrics                          Anesthesia Physical Anesthesia Plan  ASA: IV  Anesthesia Plan: General   Post-op Pain Management:    Induction: Intravenous  Airway Management Planned: Oral ETT  Additional Equipment: Arterial line and TEE  Intra-op Plan:   Post-operative Plan: Post-operative intubation/ventilation  Informed Consent: I have reviewed the patients History and Physical, chart, labs and discussed the procedure including the risks, benefits and alternatives for the proposed anesthesia with the patient or authorized representative who has indicated his/her understanding and acceptance.     Plan Discussed with: Anesthesiologist and Surgeon  Anesthesia Plan Comments:         Anesthesia Quick Evaluation

## 2013-09-09 NOTE — Progress Notes (Signed)
PULMONARY / CRITICAL CARE PROGRESS NOTE  Name: Edwin [name] MRN: [id number] DOB: 18-Oct-1955    ADMISSION DATE: 09/07/2013   PRIMARY SERVICE: PCCM  CHIEF COMPLAINT: Sepsis   BRIEF PATIENT DESCRIPTION: 58 yo with severe PAD admitted 2/12 with septic shock, acute renal failure, metabolic acidosis and hyperkalemia in setting of gangrenous non-healing RLE ulcer.   SIGNIFICANT EVENTS / STUDIES:  2/12  R tib/fib XR >>> No osteo 2/12  R ankle >>> No acute process 2/12  Endoscopy / flex sig >>> No active bleed, large gastric residual, multiple shallow gastric ulcers without hemorrhage. Suspected nonsteroidal gastropathy vs gastric mucosal ischemia. 2/13  R AKA  LINES / TUBES:  Foley cath 2/12 >>> RIJ  CVL 2/12 >>> ETT 2/13 >>> R rad a-line 2/13 >>>  CULTURES:  2/12  MRSA >>> neg 2/12  Stool >>> 2/12  Blood >>>  2/12  Urine >>> neg  ANTIBIOTICS:  Vancomycin 2/12 >>> 2/12 Zosyn 2/12 >>>  Clindamycin 2/12 >>>   INTERVAL HISTORY:  Acute anemia overnight, transfused 2 U PRBC. Failed SBT this AM.  VITAL SIGNS: Temp:  [97.7 F (36.5 C)-100.2 F (37.9 C)] 99.7 F (37.6 C) (02/14 0645) Pulse Rate:  [88-110] 88 (02/14 0645) Resp:  [17-27] 24 (02/14 0645) BP: (83-129)/(44-102) 107/64 mmHg (02/14 0600) SpO2:  [90 %-100 %] 96 % (02/14 0645) Arterial Line BP: (82-129)/(32-53) 126/50 mmHg (02/14 0645) FiO2 (%):  [24 %-40 %] 24 % (02/14 0756) Weight:  [55.1 kg (121 lb 7.6 oz)] 55.1 kg (121 lb 7.6 oz) (02/14 0400)  HEMODYNAMICS:   VENTILATOR SETTINGS: Vent Mode:  [-] PRVC FiO2 (%):  [24 %-40 %] 24 % Set Rate:  [18 bmp-24 bmp] 24 bmp Vt Set:  [550 mL] 550 mL PEEP:  [5 cmH20] 5 cmH20 Pressure Support:  [5 cmH20] 5 cmH20 Plateau Pressure:  [17 cmH20-20 cmH20] 17 cmH20  INTAKE / OUTPUT: Intake/Output     02/13 0701 - 02/14 0700 02/14 0701 - 02/15 0700   P.O.     I.V. (mL/kg) 2864.9 (52)    NG/GT 589.6    IV Piggyback 750    Total Intake(mL/kg) 4204.5 (76.3)    Urine  (mL/kg/hr) 620 (0.5)    Emesis/NG output 300 (0.2)    Other     Blood 200 (0.2)    Total Output 1120     Net +3084.5           PHYSICAL EXAMINATION: General:  Appears acutely ill, mechanically ventilated, synchronous Neuro:  Encephalopathic, nonfocal, cough / gag diminished HEENT:  PERRL, OETT / OGT, poor dentition Cardiovascular:  RRR, no m/r/g Lungs:  Bilateral diminished air entry, wheezing / rhonchi Abdomen:  Soft, nontender, bowel sounds diminished Musculoskeletal:  No edema, R BKA Skin:  Non-stageable sacral and gluteal area wound, also large gangrenous non-healing wound on right lower extremity w/ foul smelling purulent discharge  LABS:  CBC  Recent Labs Lab 09/09/13 0339 09/09/13 1730 09/10/13 0420  WBC 17.6* 13.9* 11.5*  HGB 9.2* 7.2* 6.6*  HCT 26.4* 20.8* 19.1*  PLT 264 211 170   Coag's  Recent Labs Lab 09/08/13 0020 09/09/13 0339  INR 1.31 1.37   BMET  Recent Labs Lab 09/09/13 0339 09/09/13 1730 09/10/13 0420  NA 136* 139 140  K 5.3 5.3 4.5  CL 97 101 102  CO2 21 19 18*  BUN 82* 84* 84*  CREATININE 3.87* 4.00* 3.94*  GLUCOSE 126* 137* 145*   Electrolytes  Recent Labs Lab 09/09/13 0339 09/09/13 0800  09/09/13 1730 09/10/13 0420 09/10/13 0430  CALCIUM 7.3*  --  7.5* 7.2*  --   MG 2.1 2.0 2.1  --  2.0  PHOS 7.8* 9.0* 8.7* 7.6*  --    Sepsis Markers  Recent Labs Lab 09/07/13 2149 09/08/13 0020  LATICACIDVEN 6.11* 5.8*  PROCALCITON  --  4.57   ABG  Recent Labs Lab 09/08/13 0559 09/09/13 1216 09/10/13 0405  PHART 7.474* 7.230* 7.365  PCO2ART 32.9* 49.0* 32.6*  PO2ART 51.1* 150.0* 92.4   Liver Enzymes  Recent Labs Lab 09/07/13 2047 09/08/13 0500 09/09/13 0339  AST 32  --   --   ALT 17  --   --   ALKPHOS 140*  --   --   BILITOT 0.6  --   --   ALBUMIN 2.5* 2.0* 1.6*   Cardiac Enzymes  Recent Labs Lab 09/08/13 0020 09/08/13 0225 09/08/13 1116  TROPONINI <0.30 <0.30 <0.30   Glucose  Recent Labs Lab  09/09/13 1211 09/09/13 1545 09/09/13 1906 09/10/13 0004 09/10/13 0420 09/10/13 0705  GLUCAP 87 148* 111* 157* 151* 136*   CXR: 2/14 >>> Hardware in good position, no overt airspace disease  ASSESSMENT / PLAN:  PULMONARY  A:  Acute respiratory failure COPD  P:  Goal pH>7.30, SpO2>92 Continuous mechanical support VAP bundle Daily SBT Trend ABG/CXR Albuterol PRN  CARDIOVASCULAR  A:  Septic shock  PAD S/p R AKA Hx HTN P:  Vascular following Goal MAP > 65 Levophed gtt Vasopressin gtt  RENAL  A:  AKI P:  Renal following Trend BPM D/c IVF CVVHD later today  GASTROINTESTINAL  A:  Gastropathy Suspected GI hemorrhage, but no evidence on EGD / FS Nutrition GI Px Gastroparesis / high residuals  P:  GI following Restart TF per Nutritionist Protonix BID Sucralfate Reglan 5 q8h  HEMATOLOGIC  A:  Anemia VTE Px P:  Trend CBC SCD  INFECTIOUS  A:  RLE gangrene P:  Abx / cx as above  ENDOCRINE  A:  Normoglycemic P:  CBG per TF protocol  NEUROLOGIC  A:  Acute encephalopathy Hx alcohol abuse P:  Goal RASS 0 to -1 D/c Fentanyl gtt Fentanyl PRN Versed PRN Start Precedex gtt Thiamin  I have personally obtained history, examined patient, evaluated and interpreted laboratory and imaging results, reviewed medical records, formulated assessment / plan and placed orders.  CRITICAL CARE:  The patient is critically ill with multiple organ systems failure and requires high complexity decision making for assessment and support, frequent evaluation and titration of therapies, application of advanced monitoring technologies and extensive interpretation of multiple databases. Critical Care Time devoted to patient care services described in this note is 35 minutes.   Doree Fudge, MD Pulmonary and [place] Pager: [phone]  09/10/2013, 9:09 AM

## 2013-09-10 ENCOUNTER — Inpatient Hospital Stay (HOSPITAL_COMMUNITY): Payer: Medicaid Other

## 2013-09-10 LAB — MAGNESIUM
Magnesium: 2 mg/dL (ref 1.5–2.5)
Magnesium: 2.2 mg/dL (ref 1.5–2.5)

## 2013-09-10 LAB — BASIC METABOLIC PANEL WITH GFR
BUN: 84 mg/dL — ABNORMAL HIGH (ref 6–23)
CO2: 18 meq/L — ABNORMAL LOW (ref 19–32)
Calcium: 7.2 mg/dL — ABNORMAL LOW (ref 8.4–10.5)
Chloride: 102 meq/L (ref 96–112)
Creatinine, Ser: 3.94 mg/dL — ABNORMAL HIGH (ref 0.50–1.35)
GFR calc Af Amer: 18 mL/min — ABNORMAL LOW
GFR calc non Af Amer: 16 mL/min — ABNORMAL LOW
Glucose, Bld: 145 mg/dL — ABNORMAL HIGH (ref 70–99)
Potassium: 4.5 meq/L (ref 3.7–5.3)
Sodium: 140 meq/L (ref 137–147)

## 2013-09-10 LAB — GLUCOSE, CAPILLARY
Glucose-Capillary: 122 mg/dL — ABNORMAL HIGH (ref 70–99)
Glucose-Capillary: 136 mg/dL — ABNORMAL HIGH (ref 70–99)
Glucose-Capillary: 151 mg/dL — ABNORMAL HIGH (ref 70–99)
Glucose-Capillary: 157 mg/dL — ABNORMAL HIGH (ref 70–99)
Glucose-Capillary: 80 mg/dL (ref 70–99)

## 2013-09-10 LAB — BLOOD GAS, ARTERIAL
ACID-BASE DEFICIT: 6.2 mmol/L — AB (ref 0.0–2.0)
Bicarbonate: 18 mEq/L — ABNORMAL LOW (ref 20.0–24.0)
Drawn by: 36496
FIO2: 0.4 %
LHR: 24 {breaths}/min
MECHVT: 550 mL
O2 SAT: 96.3 %
PEEP: 5 cmH2O
Patient temperature: 99.8
TCO2: 19 mmol/L (ref 0–100)
pCO2 arterial: 32.6 mmHg — ABNORMAL LOW (ref 35.0–45.0)
pH, Arterial: 7.365 (ref 7.350–7.450)
pO2, Arterial: 92.4 mmHg (ref 80.0–100.0)

## 2013-09-10 LAB — CBC
HEMATOCRIT: 19.1 % — AB (ref 39.0–52.0)
Hemoglobin: 6.6 g/dL — CL (ref 13.0–17.0)
MCH: 32.2 pg (ref 26.0–34.0)
MCHC: 34.6 g/dL (ref 30.0–36.0)
MCV: 93.2 fL (ref 78.0–100.0)
Platelets: 170 10*3/uL (ref 150–400)
RBC: 2.05 MIL/uL — ABNORMAL LOW (ref 4.22–5.81)
RDW: 16.6 % — AB (ref 11.5–15.5)
WBC: 11.5 10*3/uL — AB (ref 4.0–10.5)

## 2013-09-10 LAB — RENAL FUNCTION PANEL
ALBUMIN: 1.6 g/dL — AB (ref 3.5–5.2)
BUN: 70 mg/dL — AB (ref 6–23)
CALCIUM: 7.4 mg/dL — AB (ref 8.4–10.5)
CO2: 19 mEq/L (ref 19–32)
Chloride: 102 mEq/L (ref 96–112)
Creatinine, Ser: 3 mg/dL — ABNORMAL HIGH (ref 0.50–1.35)
GFR calc Af Amer: 25 mL/min — ABNORMAL LOW (ref >90)
GFR calc non Af Amer: 22 mL/min — ABNORMAL LOW (ref >90)
GLUCOSE: 104 mg/dL — AB (ref 70–99)
Phosphorus: 5.7 mg/dL — ABNORMAL HIGH (ref 2.3–4.6)
Potassium: 4 mEq/L (ref 3.7–5.3)
Sodium: 138 mEq/L (ref 137–147)

## 2013-09-10 LAB — PREPARE RBC (CROSSMATCH)

## 2013-09-10 LAB — PHOSPHORUS: Phosphorus: 7.6 mg/dL — ABNORMAL HIGH (ref 2.3–4.6)

## 2013-09-10 LAB — MYOGLOBIN, URINE: Myoglobin, Ur: 609 mcg/L — ABNORMAL HIGH (ref <28)

## 2013-09-10 MED ORDER — THIAMINE HCL 100 MG/ML IJ SOLN
100.0000 mg | Freq: Every day | INTRAMUSCULAR | Status: DC
  Administered 2013-09-10 – 2013-09-17 (×8): 100 mg via INTRAVENOUS
  Filled 2013-09-10 (×9): qty 1

## 2013-09-10 MED ORDER — PRISMASOL BGK 4/2.5 32-4-2.5 MEQ/L IV SOLN
INTRAVENOUS | Status: DC
Start: 2013-09-10 — End: 2013-09-13
  Administered 2013-09-10 – 2013-09-13 (×5): via INTRAVENOUS_CENTRAL
  Filled 2013-09-10 (×5): qty 5000

## 2013-09-10 MED ORDER — FENTANYL CITRATE 0.05 MG/ML IJ SOLN
100.0000 ug | INTRAMUSCULAR | Status: DC | PRN
  Administered 2013-09-10 – 2013-09-11 (×7): 100 ug via INTRAVENOUS
  Filled 2013-09-10 (×7): qty 2

## 2013-09-10 MED ORDER — PRISMASOL BGK 4/2.5 32-4-2.5 MEQ/L IV SOLN
INTRAVENOUS | Status: DC
  Administered 2013-09-10 – 2013-09-13 (×4): via INTRAVENOUS_CENTRAL
  Filled 2013-09-10 (×5): qty 5000

## 2013-09-10 MED ORDER — DARBEPOETIN ALFA-POLYSORBATE 100 MCG/0.5ML IJ SOLN
100.0000 ug | INTRAMUSCULAR | Status: DC
  Administered 2013-09-10: 100 ug via INTRAVENOUS
  Filled 2013-09-10 (×2): qty 0.5

## 2013-09-10 MED ORDER — HEPARIN SODIUM (PORCINE) 1000 UNIT/ML DIALYSIS
1000.0000 [IU] | INTRAMUSCULAR | Status: DC | PRN
  Administered 2013-09-12: 2400 [IU] via INTRAVENOUS_CENTRAL
  Filled 2013-09-10: qty 4
  Filled 2013-09-10: qty 6

## 2013-09-10 MED ORDER — PRISMASOL BGK 4/2.5 32-4-2.5 MEQ/L IV SOLN
INTRAVENOUS | Status: DC
  Administered 2013-09-10 – 2013-09-13 (×18): via INTRAVENOUS_CENTRAL
  Filled 2013-09-10 (×27): qty 5000

## 2013-09-10 MED ORDER — SODIUM CHLORIDE 0.9 % IV SOLN
INTRAVENOUS | Status: DC
Start: 2013-09-10 — End: 2013-09-15

## 2013-09-10 MED ORDER — SODIUM CHLORIDE 0.9 % FOR CRRT
INTRAVENOUS_CENTRAL | Status: DC | PRN
  Filled 2013-09-10: qty 1000

## 2013-09-10 MED ORDER — DEXMEDETOMIDINE HCL IN NACL 200 MCG/50ML IV SOLN
0.4000 ug/kg/h | INTRAVENOUS | Status: DC
  Administered 2013-09-10: 0.75 ug/kg/h via INTRAVENOUS
  Filled 2013-09-10: qty 50

## 2013-09-10 MED ORDER — METOCLOPRAMIDE HCL 5 MG/ML IJ SOLN
5.0000 mg | Freq: Three times a day (TID) | INTRAMUSCULAR | Status: DC
  Administered 2013-09-10 – 2013-09-12 (×7): 5 mg via INTRAVENOUS
  Administered 2013-09-12: 19:00:00 via INTRAVENOUS
  Administered 2013-09-13: 5 mg via INTRAVENOUS
  Filled 2013-09-10 (×12): qty 1

## 2013-09-10 NOTE — Progress Notes (Signed)
ANTIBIOTIC CONSULT NOTE - FOLLOW UP  Pharmacy Consult:  Vancomycin / Zosyn Indication:  Rule out sepsis  Allergies  Allergen Reactions  . Codeine Nausea Only    Patient Measurements: Height: 5\' 9"  (175.3 cm) Weight: 121 lb 7.6 oz (55.1 kg) IBW/kg (Calculated) : 70.7  Vital Signs: Temp: 99.7 F (37.6 C) (02/14 0645) Temp src: Core (Comment) (02/14 0600) BP: 107/64 mmHg (02/14 0600) Pulse Rate: 88 (02/14 0645) Intake/Output from previous day: 02/13 0701 - 02/14 0700 In: 4204.5 [I.V.:2864.9; NG/GT:589.6; IV Piggyback:750] Out: 1120 [Urine:620; Emesis/NG output:300; Blood:200]  Labs:  Recent Labs  09/08/13 0020 09/08/13 0110  09/09/13 0339 09/09/13 1730 09/10/13 0420  WBC 18.3*  --   < > 17.6* 13.9* 11.5*  HGB 10.0*  --   < > 9.2* 7.2* 6.6*  PLT 335  --   < > 264 211 170  LABCREA  --  39.04  --   --   --   --   CREATININE 5.48*  --   < > 3.87* 4.00* 3.94*  < > = values in this interval not displayed. Estimated Creatinine Clearance: 16.3 ml/min (by C-G formula based on Cr of 3.94). No results found for this basename: VANCOTROUGH, VANCOPEAK, VANCORANDOM, GENTTROUGH, GENTPEAK, GENTRANDOM, TOBRATROUGH, TOBRAPEAK, TOBRARND, AMIKACINPEAK, AMIKACINTROU, AMIKACIN,  in the last 72 hours     Assessment: 27 YOM with history of gangrenous non-healing RLE ulcer admitted with sepsis  on vancomycin, clindamycin and Zosyn.  Patient noted with septic shock and ARF and noted s/p R AKA . WBC= 111.5, SCr= 3.94, CrCl ~ 16 (UOP 0.5 ml/kg/hr). Noted  plans for CRRT  Vanc 2/11 >> Zosyn 2/11 >> Clinda 2/12 >>  2/12 BCx - ngtd 2/12 UCx - ng 2/12 c diff neg   Goal of Therapy:  Vancomycin trough level 15-20 mcg/ml   Plan:  -Continue zosyn  2.25gm IV q6h -Change vancomycin to 500 mg IV Q24hr -Will follow renal function, cultures and clinical progress  Hildred Laser, Pharm D 09/10/2013 12:17 PM

## 2013-09-10 NOTE — Progress Notes (Signed)
Family reversed code status to "full" after discussing with patient.  RN aware.  Orders placed.

## 2013-09-10 NOTE — Progress Notes (Signed)
eLink Physician-Brief Progress Note Patient Name: Edwin [name] DOB: 02-Jun-1956 MRN: [id number]  Date of Service  09/10/2013   HPI/Events of Note  hgb 6.6 this am.  No active bleeding.   eICU Interventions  2 units PRBC's ordered      Mauri Brooklyn, P 09/10/2013, 5:31 AM

## 2013-09-10 NOTE — Progress Notes (Signed)
CRITICAL VALUE ALERT  Critical value received:  Hgb 6.6  Date of notification:  09-10-13  Time of notification:  0530  Critical value read back:yes  Nurse who received alert:  T. Sherral Hammers, RN  MD notified (1st page):  Dr. Tamala Julian   Time of first page:  0530  Responding MD:  Dr. Tamala Julian  Time MD responded:  [phone]

## 2013-09-10 NOTE — Progress Notes (Addendum)
[facility]  Subjective  - Alert and nods response of understanding.  Intubated.   Objective 107/64 88 99.7 F (37.6 C) (Core (Comment)) 24 96%  Intake/Output Summary (Last 24 hours) at 09/10/13 0801 Last data filed at 09/10/13 0600  Gross per 24 hour  Intake 4204.49 ml  Output   1120 ml  Net 3084.49 ml    Right amputation dressing is clean and dry. Left foot warm to touch.  Assessment/Planning: POD #1  Left BKA  Laurence Slate [facility] 09/10/2013 8:01 AM --  Laboratory Lab Results:  Recent Labs  09/09/13 1730 09/10/13 0420  WBC 13.9* 11.5*  HGB 7.2* 6.6*  HCT 20.8* 19.1*  PLT 211 170   BMET  Recent Labs  09/09/13 1730 09/10/13 0420  NA 139 140  K 5.3 4.5  CL 101 102  CO2 19 18*  GLUCOSE 137* 145*  BUN 84* 84*  CREATININE 4.00* 3.94*  CALCIUM 7.5* 7.2*    COAG Lab Results  Component Value Date   INR 1.37 09/09/2013   INR 1.31 09/08/2013   INR 0.94 08/16/2013   No results found for this basename: PTT   Agree with above assessment-patient with blood pressure in the 597 416L systolic range Right AKA dressing dry Will check wound in a.m.

## 2013-09-10 NOTE — Progress Notes (Signed)
Subjective:  Events noted. S/P BKA yest - post op has been unstable- requiring increasing dose of pressors, UOP marginal- hgb 6.6 this AM.  Is much more in than out  Objective Vital signs in last 24 hours: Filed Vitals:   09/10/13 0600 09/10/13 0615 09/10/13 0630 09/10/13 0645  BP: 107/64     Pulse:   99 88  Temp: 99.8 F (37.7 C) 99.7 F (37.6 C) 99.7 F (37.6 C) 99.7 F (37.6 C)  TempSrc: Core (Comment)     Resp: 24 24 24 24   Height:      Weight:      SpO2: 95% 100% 95% 96%   Weight change: 0.1 kg (3.5 oz)  Intake/Output Summary (Last 24 hours) at 09/10/13 0717 Last data filed at 09/10/13 0600  Gross per 24 hour  Intake 4204.49 ml  Output   1120 ml  Net 3084.49 ml    Assessment/ Plan: Pt is a 58 y.o. yo male who was admitted on 09/07/2013 with AKI in the setting of naprosyn/ACE-I and sepsis related to gangrenous foot  Assessment/Plan: 1. AKI- in the setting of above.  Required brief CRRT initially for hyperkalemia- taken off Thursday AM- is making urine , but not a robust amount.  He still requires pressors overnight.  I felt like was dry but now at least 3 liters positive and pressor need has not diminished.  I feel like we should resume CRRT mainly for clearance and to avoid continued volume overload as well as previous borderline potassium with blood need. This may also help with his recovery.  No heparin, run even or based on CVP's. He had normal renal function less than month ago so am hopeful for eventual recovery eventually.  2. Gangrene- s/p BKA- clinda/zosyn 3. Anemia- no obvious bleeding with invasive testing- supportive care- transfuse today- add aranesp also  4. HTN/volume- I thought initially dry- is very positive with no affect- will check CVP's and follow 5. Hyperkalemia- stable for now-   Chenee Munns A    Labs: Basic Metabolic Panel:  Recent Labs Lab 09/09/13 0339 09/09/13 0800 09/09/13 1730 09/10/13 0420  NA 136*  --  139 140  K 5.3  --   5.3 4.5  CL 97  --  101 102  CO2 21  --  19 18*  GLUCOSE 126*  --  137* 145*  BUN 82*  --  84* 84*  CREATININE 3.87*  --  4.00* 3.94*  CALCIUM 7.3*  --  7.5* 7.2*  PHOS 7.8* 9.0* 8.7* 7.6*   Liver Function Tests:  Recent Labs Lab 09/07/13 2047 09/08/13 0500 09/09/13 0339  AST 32  --   --   ALT 17  --   --   ALKPHOS 140*  --   --   BILITOT 0.6  --   --   PROT 7.4  --   --   ALBUMIN 2.5* 2.0* 1.6*    Recent Labs Lab 09/07/13 2047  LIPASE 127*   No results found for this basename: AMMONIA,  in the last 168 hours CBC:  Recent Labs Lab 09/07/13 2047  09/08/13 0225 09/08/13 1637 09/09/13 0339 09/09/13 1730 09/10/13 0420  WBC 33.5*  < > 14.1* 11.5* 17.6* 13.9* 11.5*  NEUTROABS 30.8*  --   --   --   --   --   --   HGB 8.0*  < > 8.7* 8.6* 9.2* 7.2* 6.6*  HCT 23.3*  < > 24.9* 25.6* 26.4* 20.8* 19.1*  MCV 92.8  < >  90.9 93.4 93.3 93.7 93.2  PLT 529*  < > 291 244 264 211 170  < > = values in this interval not displayed. Cardiac Enzymes:  Recent Labs Lab 09/08/13 0020 09/08/13 0225 09/08/13 1116  CKTOTAL 47  --   --   TROPONINI <0.30 <0.30 <0.30   CBG:  Recent Labs Lab 09/09/13 1211 09/09/13 1545 09/09/13 1906 09/10/13 0004 09/10/13 0420  GLUCAP 87 148* 111* 157* 151*    Iron Studies: No results found for this basename: IRON, TIBC, TRANSFERRIN, FERRITIN,  in the last 72 hours Studies/Results: No results found. Medications: Infusions: . dextrose 5 % and 0.9% NaCl 75 mL/hr at 09/09/13 1628  . feeding supplement (VITAL AF 1.2 CAL) 1,000 mL (09/09/13 1625)  . fentaNYL infusion INTRAVENOUS 250 mcg/hr (09/09/13 2215)  . norepinephrine (LEVOPHED) Adult infusion 20 mcg/min (09/10/13 0400)  . norepinephrine (LEVOPHED) Adult infusion Stopped (09/08/13 2200)  . vasopressin (PITRESSIN) infusion - *FOR SHOCK* 0.03 Units/min (09/09/13 2100)    Scheduled Medications: . antiseptic oral rinse  15 mL Mouth Rinse QID  . chlorhexidine  15 mL Mouth Rinse BID  .  clindamycin (CLEOCIN) IV  600 mg Intravenous 4 times per day  . collagenase   Topical Daily  . insulin aspart  0-9 Units Subcutaneous 6 times per day  . pantoprazole (PROTONIX) IV  40 mg Intravenous Q12H  . piperacillin-tazobactam (ZOSYN)  IV  2.25 g Intravenous 4 times per day  . vancomycin  750 mg Intravenous Q48H    have reviewed scheduled and prn medications.  Physical Exam: General: on vent Heart: tachy Lungs: mostly clear Abdomen: soft, tender epigastric area- slightly distended Extremities: no edema-  S/p bka Dialysis Access: right  IJ temp cath- placed 2/12   09/10/2013,7:17 AM  LOS: 3 days

## 2013-09-10 NOTE — Progress Notes (Signed)
Discuss goals of care with daughter - medical POA.  DNI/DNR requested.  Will extubate when fits criteria with intent NOT to re intubate if fails.  Will continue full medical care for now and consider comfort measures if deteriorates.  RN updated.  Orders placed.

## 2013-09-10 NOTE — Progress Notes (Signed)
SBT stopped due to decreased RR to 6 and MV to 2.

## 2013-09-11 ENCOUNTER — Inpatient Hospital Stay (HOSPITAL_COMMUNITY): Payer: Medicaid Other

## 2013-09-11 LAB — RENAL FUNCTION PANEL
Albumin: 1.7 g/dL — ABNORMAL LOW (ref 3.5–5.2)
BUN: 29 mg/dL — AB (ref 6–23)
CO2: 23 meq/L (ref 19–32)
Calcium: 8 mg/dL — ABNORMAL LOW (ref 8.4–10.5)
Chloride: 103 mEq/L (ref 96–112)
Creatinine, Ser: 1.38 mg/dL — ABNORMAL HIGH (ref 0.50–1.35)
GFR calc Af Amer: 65 mL/min — ABNORMAL LOW (ref >90)
GFR calc non Af Amer: 56 mL/min — ABNORMAL LOW (ref >90)
GLUCOSE: 131 mg/dL — AB (ref 70–99)
PHOSPHORUS: 2.6 mg/dL (ref 2.3–4.6)
POTASSIUM: 4.2 meq/L (ref 3.7–5.3)
SODIUM: 140 meq/L (ref 137–147)

## 2013-09-11 LAB — TYPE AND SCREEN
ABO/RH(D): A POS
ANTIBODY SCREEN: NEGATIVE
Unit division: 0
Unit division: 0

## 2013-09-11 LAB — CBC
HEMATOCRIT: 26.8 % — AB (ref 39.0–52.0)
HEMOGLOBIN: 9.5 g/dL — AB (ref 13.0–17.0)
MCH: 31.6 pg (ref 26.0–34.0)
MCHC: 35.4 g/dL (ref 30.0–36.0)
MCV: 89 fL (ref 78.0–100.0)
Platelets: 123 10*3/uL — ABNORMAL LOW (ref 150–400)
RBC: 3.01 MIL/uL — ABNORMAL LOW (ref 4.22–5.81)
RDW: 17 % — ABNORMAL HIGH (ref 11.5–15.5)
WBC: 11.1 10*3/uL — AB (ref 4.0–10.5)

## 2013-09-11 LAB — GLUCOSE, CAPILLARY
GLUCOSE-CAPILLARY: 110 mg/dL — AB (ref 70–99)
GLUCOSE-CAPILLARY: 82 mg/dL (ref 70–99)
Glucose-Capillary: 81 mg/dL (ref 70–99)
Glucose-Capillary: 95 mg/dL (ref 70–99)
Glucose-Capillary: 90 mg/dL (ref 70–99)
Glucose-Capillary: 116 mg/dL — ABNORMAL HIGH (ref 70–99)
Glucose-Capillary: 45 mg/dL — ABNORMAL LOW (ref 70–99)

## 2013-09-11 LAB — MAGNESIUM
Magnesium: 2.2 mg/dL (ref 1.5–2.5)
Magnesium: 2.3 mg/dL (ref 1.5–2.5)

## 2013-09-11 LAB — BASIC METABOLIC PANEL
BUN: 41 mg/dL — AB (ref 6–23)
CHLORIDE: 104 meq/L (ref 96–112)
CO2: 23 mEq/L (ref 19–32)
CREATININE: 1.87 mg/dL — AB (ref 0.50–1.35)
Calcium: 7.8 mg/dL — ABNORMAL LOW (ref 8.4–10.5)
GFR calc Af Amer: 45 mL/min — ABNORMAL LOW (ref >90)
GFR calc non Af Amer: 39 mL/min — ABNORMAL LOW (ref >90)
GLUCOSE: 107 mg/dL — AB (ref 70–99)
POTASSIUM: 3.6 meq/L — AB (ref 3.7–5.3)
Sodium: 142 mEq/L (ref 137–147)

## 2013-09-11 LAB — PHOSPHORUS: Phosphorus: 3.2 mg/dL (ref 2.3–4.6)

## 2013-09-11 LAB — APTT: APTT: 36 s (ref 24–37)

## 2013-09-11 MED ORDER — AMIODARONE LOAD VIA INFUSION
150.0000 mg | Freq: Once | INTRAVENOUS | Status: AC
  Administered 2013-09-11: 150 mg via INTRAVENOUS
  Filled 2013-09-11: qty 83.34

## 2013-09-11 MED ORDER — POTASSIUM CHLORIDE 10 MEQ/50ML IV SOLN
10.0000 meq | INTRAVENOUS | Status: AC
  Administered 2013-09-11 (×4): 10 meq via INTRAVENOUS
  Filled 2013-09-11: qty 50

## 2013-09-11 MED ORDER — AMIODARONE HCL IN DEXTROSE 360-4.14 MG/200ML-% IV SOLN
30.0000 mg/h | INTRAVENOUS | Status: DC
  Administered 2013-09-11 – 2013-09-13 (×5): 30 mg/h via INTRAVENOUS
  Filled 2013-09-11 (×8): qty 200

## 2013-09-11 MED ORDER — LORAZEPAM 2 MG/ML IJ SOLN
2.0000 mg | INTRAMUSCULAR | Status: DC | PRN
  Administered 2013-09-11: 2 mg via INTRAVENOUS
  Filled 2013-09-11: qty 1

## 2013-09-11 MED ORDER — LORAZEPAM 2 MG/ML IJ SOLN
INTRAMUSCULAR | Status: AC
  Administered 2013-09-11: 2 mg
  Filled 2013-09-11: qty 1

## 2013-09-11 MED ORDER — VANCOMYCIN HCL 500 MG IV SOLR
500.0000 mg | INTRAVENOUS | Status: DC
  Administered 2013-09-11 – 2013-09-12 (×2): 500 mg via INTRAVENOUS
  Filled 2013-09-11 (×3): qty 500

## 2013-09-11 MED ORDER — FENTANYL CITRATE 0.05 MG/ML IJ SOLN
100.0000 ug | Freq: Once | INTRAMUSCULAR | Status: AC
  Administered 2013-09-11: 100 ug via INTRAVENOUS

## 2013-09-11 MED ORDER — AMIODARONE HCL IN DEXTROSE 360-4.14 MG/200ML-% IV SOLN
60.0000 mg/h | INTRAVENOUS | Status: AC
  Administered 2013-09-11: 60 mg/h via INTRAVENOUS
  Filled 2013-09-11 (×3): qty 200

## 2013-09-11 MED ORDER — SODIUM CHLORIDE 0.9 % IV SOLN
25.0000 ug/h | INTRAVENOUS | Status: DC
  Administered 2013-09-11: 50 ug/h via INTRAVENOUS
  Administered 2013-09-12 – 2013-09-13 (×2): 100 ug/h via INTRAVENOUS
  Filled 2013-09-11 (×2): qty 50

## 2013-09-11 MED ORDER — DEXTROSE 10 % IV SOLN
INTRAVENOUS | Status: DC
  Administered 2013-09-11 – 2013-09-14 (×3): via INTRAVENOUS

## 2013-09-11 NOTE — Progress Notes (Addendum)
[facility], but still intubated.   Objective 81/54 110 97.1 F (36.2 C) (Core (Comment)) 24 99%  Intake/Output Summary (Last 24 hours) at 09/11/13 0826 Last data filed at 09/11/13 0800  Gross per 24 hour  Intake 2928.82 ml  Output   3410 ml  Net -481.18 ml    Right AKA stump clean and dry warm to touch. No active drainage. Clean dry dressing applied  Assessment/Planning: POD #2 Right AKA   Laurence Slate [facility] 09/11/2013 8:26 AM --  Laboratory Lab Results:  Recent Labs  09/10/13 0420 09/11/13 0412  WBC 11.5* 11.1*  HGB 6.6* 9.5*  HCT 19.1* 26.8*  PLT 170 123*   BMET  Recent Labs  09/10/13 1600 09/11/13 0412  NA 138 142  K 4.0 3.6*  CL 102 104  CO2 19 23  GLUCOSE 104* 107*  BUN 70* 41*  CREATININE 3.00* 1.87*  CALCIUM 7.4* 7.8*    COAG Lab Results  Component Value Date   INR 1.37 09/09/2013   INR 1.31 09/08/2013   INR 0.94 08/16/2013   No results found for this basename: PTT      Regroup above assessment AKA healing nicely We'll continue to follow with you

## 2013-09-11 NOTE — Progress Notes (Signed)
Re-bolused with 150 mg Amiodarone per order Dr. Earnest Conroy for Afib with RVR ( HR 140). Vasopressin resumed at 0.03 mcg per Dr. Earnest Conroy.

## 2013-09-11 NOTE — Progress Notes (Signed)
Bilious Gastric residuals continue to climb despite the fact that tube feeds are on hold. Abdomen is firm, with tympanic bowel sounds. Pt does not complain of pain. Dr. Earnest Conroy is aware. Ordered to waste the 650 cc of residual obtained at 1800. D10W started at 50cc/hr to prevent hypoglycemia. KUB ordered and obtained. Await results for further orders.

## 2013-09-11 NOTE — Progress Notes (Signed)
[facility] ADULT ICU REPLACEMENT PROTOCOL FOR AM LAB REPLACEMENT ONLY  The patient does not apply for the [facility] Adult ICU Electrolyte Replacment Protocol based on the criteria listed below:   1. Is GFR >/= 40 ml/min? no  Patient's GFR today is 39 2. Is urine output >/= 0.5 ml/kg/hr for the last 6 hours? no Patient's UOP is 0.2 ml/kg/hr 3. Is BUN < 60 mg/dL? yes  Patient's BUN today is 41 4. Abnormal electrolyte(s): K+3.6 5. Ordered repletion with: NA 6. If a panic level lab has been reported, has the CCM MD in charge been notified? yes.   Physician:  Dr Levin Bacon, Eddie Dibbles [facility] 09/11/2013 6:22 AM

## 2013-09-11 NOTE — Progress Notes (Signed)
PULMONARY / CRITICAL CARE PROGRESS NOTE  Name: Edwin [name] MRN: [id number] DOB: [birth year]    ADMISSION DATE: 09/07/2013   PRIMARY SERVICE: PCCM  CHIEF COMPLAINT: Sepsis   BRIEF PATIENT DESCRIPTION: 58 yo with severe PAD admitted 2/12 with septic shock, acute renal failure, metabolic acidosis and hyperkalemia in setting of gangrenous non-healing RLE ulcer.   SIGNIFICANT EVENTS / STUDIES:  2/12  R tib/fib XR >>> No osteo 2/12  R ankle >>> No acute process 2/12  Endoscopy / flex sig >>> No active bleed, large gastric residual, multiple shallow gastric ulcers without hemorrhage. Suspected nonsteroidal gastropathy vs gastric mucosal ischemia. 2/13  R AKA  LINES / TUBES:  Foley cath 2/12 >>> RIJ  CVL 2/12 >>> ETT 2/13 >>> R rad a-line 2/13 >>>  CULTURES:  2/12  MRSA >>> neg 2/12  Stool >>> 2/12  Blood >>>  2/12  Urine >>> neg  ANTIBIOTICS:  Vancomycin 2/12 >>> 2/12 Zosyn 2/12 >>>  Clindamycin 2/12 >>>   INTERVAL HISTORY:  Bradycardic on Precedex - d/c'd.  AF/RVR.  Vasopressin d/c'd. High gastric residuals.  VITAL SIGNS: Temp:  [96.2 F (35.7 C)-99.6 F (37.6 C)] 97.1 F (36.2 C) (02/15 0700) Pulse Rate:  [25-131] 110 (02/15 0700) Resp:  [10-27] 24 (02/15 0700) BP: (81-133)/(31-91) 81/54 mmHg (02/15 0700) SpO2:  [92 %-100 %] 99 % (02/15 0700) Arterial Line BP: (91-190)/(45-86) 101/52 mmHg (02/15 0700) FiO2 (%):  [40 %] 40 % (02/15 0700) Weight:  [55.8 kg (123 lb 0.3 oz)] 55.8 kg (123 lb 0.3 oz) (02/15 0400)  HEMODYNAMICS: CVP:  [3 mmHg-8 mmHg] 8 mmHg VENTILATOR SETTINGS: Vent Mode:  [-] PRVC FiO2 (%):  [40 %] 40 % Set Rate:  [24 bmp] 24 bmp Vt Set:  [550 mL] 550 mL PEEP:  [5 cmH20] 5 cmH20 Pressure Support:  [5 cmH20] 5 cmH20 Plateau Pressure:  [19 cmH20-22 cmH20] 19 cmH20  INTAKE / OUTPUT: Intake/Output     02/14 0701 - 02/15 0700 02/15 0701 - 02/16 0700   I.V. (mL/kg) 845.3 (15.1)    Blood 335    Other 105    NG/GT 1340    IV Piggyback 400    Total  Intake(mL/kg) 3025.3 (54.2)    Urine (mL/kg/hr) 650 (0.5)    Emesis/NG output 1400 (1)    Other 1509 (1.1)    Blood     Total Output 3559     Net -533.7           PHYSICAL EXAMINATION: General:  Mechanically ventilated, autoPEEP Neuro:  Appears anxious, follows commands HEENT:  PERRL, OETT / OGT, poor dentition Cardiovascular:  Tachycardic, irregular, no m/r/g Lungs:  Bilateral very diminished air entry Abdomen:  Soft, nontender, bowel sounds diminished Musculoskeletal:  No edema, R BKA Skin:  Sacral / gluteal decubiti   LABS:  CBC  Recent Labs Lab 09/09/13 1730 09/10/13 0420 09/11/13 0412  WBC 13.9* 11.5* 11.1*  HGB 7.2* 6.6* 9.5*  HCT 20.8* 19.1* 26.8*  PLT 211 170 123*   Coag's  Recent Labs Lab 09/08/13 0020 09/09/13 0339 09/11/13 0412  APTT  --   --  36  INR 1.31 1.37  --    BMET  Recent Labs Lab 09/10/13 0420 09/10/13 1600 09/11/13 0412  NA 140 138 142  K 4.5 4.0 3.6*  CL 102 102 104  CO2 18* 19 23  BUN 84* 70* 41*  CREATININE 3.94* 3.00* 1.87*  GLUCOSE 145* 104* 107*   Electrolytes  Recent Labs Lab 09/10/13 0420  09/10/13 0430 09/10/13 1600 09/11/13 0412  CALCIUM 7.2*  --  7.4* 7.8*  MG  --  2.0 2.2 2.2  PHOS 7.6*  --  5.7* 3.2   Sepsis Markers  Recent Labs Lab 09/07/13 2149 09/08/13 0020  LATICACIDVEN 6.11* 5.8*  PROCALCITON  --  4.57   ABG  Recent Labs Lab 09/08/13 0559 09/09/13 1216 09/10/13 0405  PHART 7.474* 7.230* 7.365  PCO2ART 32.9* 49.0* 32.6*  PO2ART 51.1* 150.0* 92.4   Liver Enzymes  Recent Labs Lab 09/07/13 2047 09/08/13 0500 09/09/13 0339 09/10/13 1600  AST 32  --   --   --   ALT 17  --   --   --   ALKPHOS 140*  --   --   --   BILITOT 0.6  --   --   --   ALBUMIN 2.5* 2.0* 1.6* 1.6*   Cardiac Enzymes  Recent Labs Lab 09/08/13 0020 09/08/13 0225 09/08/13 1116  TROPONINI <0.30 <0.30 <0.30   Glucose  Recent Labs Lab 09/10/13 1503 09/10/13 1930 09/10/13 1933 09/10/13 2342  09/11/13 0331 09/11/13 0711  GLUCAP 80 43* 122* 82 81 110*   CXR: None today  ASSESSMENT / PLAN:  PULMONARY  A:  Acute respiratory failure COPD  Suspect very poor baseline pulmonary function P:  Goal pH>7.30, SpO2>92 Continuous mechanical support VAP bundle Daily SBT, hold for now until HR controlled Trend ABG/CXR Albuterol PRN May need tracheostomy son, need to discuss with family  CARDIOVASCULAR  A:  Septic shock  AF/RVR S/p R AKA Hx HTN, PVD P:  Vascular following Goal MAP > 65 Levophed gtt to off as tolerated Restart Vasopressin gtt, d/c last Rebolus Amiodarone Amiodarone gtt  RENAL  A:  AKI Mild hypokalemia  P:  Renal following Trend BPM CVVHD  GASTROINTESTINAL  A:  Gastropathy Gastroparesis / high residuals Suspected GI hemorrhage, but no evidence on EGD / FS Nutrition GI Px P:  GI following TF per Nutritionist, limit rate to 30 mL/h Protonix BID Sucralfate Reglan 5 q8h  HEMATOLOGIC  A:  Anemia VTE Px P:  Trend CBC SCD Aranesp  INFECTIOUS  A:  RLE gangrene P:  Abx / cx as above  ENDOCRINE  A:  Normoglycemic P:  CBG per TF protocol  NEUROLOGIC  A:  Acute encephalopathy Hx alcohol abuse Anxiety P:  Goal RASS 0 to -1 D/c Precedex Fentanyl PRN D/c Versed Ativan scheduled Thiamin  I have personally obtained history, examined patient, evaluated and interpreted laboratory and imaging results, reviewed medical records, formulated assessment / plan and placed orders.  CRITICAL CARE:  The patient is critically ill with multiple organ systems failure and requires high complexity decision making for assessment and support, frequent evaluation and titration of therapies, application of advanced monitoring technologies and extensive interpretation of multiple databases. Critical Care Time devoted to patient care services described in this note is 35 minutes.   Doree Fudge, MD [facility]monary and [place] Pager: [phone]  09/11/2013, 8:07 AM

## 2013-09-11 NOTE — Progress Notes (Signed)
550 cc of tube feed residuals at 1200 residual check. Returned per protocol. Tube feeds which had been decreased to 30 on hold. Dr. Earnest Conroy is aware

## 2013-09-11 NOTE — Progress Notes (Signed)
eLink Physician-Brief Progress Note Patient Name: Edwin [name] DOB: 1955-09-16 MRN: [id number]  Date of Service  09/11/2013   HPI/Events of Note   Continued abdominal distention and hi-residual. ?SBO on KUB.  eICU Interventions   Place NG tube to LIS. Repeat KUB in AM.    Intervention Category Intermediate Interventions: OtherLowry [name], Khyleigh Furney R. 09/11/2013, 7:57 PM

## 2013-09-11 NOTE — Progress Notes (Signed)
Subjective:  Events noted. Afib and some hemodynamic instability and inability to wean from vent.  His pressor support is less however.  UOP only 10-15 cc per hour Objective Vital signs in last 24 hours: Filed Vitals:   09/11/13 0615 09/11/13 0630 09/11/13 0645 09/11/13 0700  BP:    81/54  Pulse: 62 25 108 110  Temp: 97.2 F (36.2 C) 97.2 F (36.2 C) 97.2 F (36.2 C) 97.1 F (36.2 C)  TempSrc:    Core (Comment)  Resp: 24 21 21 24   Height:      Weight:      SpO2: 100% 100% 100% 99%   Weight change: -4 kg (-8 lb 13.1 oz)  Intake/Output Summary (Last 24 hours) at 09/11/13 0102 Last data filed at 09/11/13 0800  Gross per 24 hour  Intake 2928.82 ml  Output   3410 ml  Net -481.18 ml    Assessment/ Plan: Pt is a 58 y.o. yo male who was admitted on 09/07/2013 with AKI in the setting of naprosyn/ACE-I and sepsis related to gangrenous foot  Assessment/Plan: 1. AKI- in the setting of above.  Required brief CRRT initially for hyperkalemia- taken off Thursday AM- resumed Saturday in the setting of mild hyperkalemia, increased pressor need and minimal UOP.  is making a little urine.   He still requires pressors overnight but they have been decreased.  Now has Afib complicating matters.   Will continue CRRT mainly for clearance and to avoid continued volume overload as well as previous borderline potassium with blood need. This may also help with his recovery.  No heparin, run even or based on CVP's. He had normal renal function less than month ago so am hopeful for eventual recovery eventually.  2. Gangrene- s/p AKA- clinda/zosyn/vanc 3. Anemia- no obvious bleeding with invasive GI testing, s/p surgery- supportive care- transfused Saturday- have added aranesp - better today 4. HTN/volume- CVP stable and pressors weaning- running even with CRRT 5. Hyperkalemia- now a little low, on all 4 K bath, will give a little today especially in the setting of Afib 6. Dispo- were initially talking about  backing off, now again full code 7. Malnutrition - complicating factor  Leticia Coletta A    Labs: Basic Metabolic Panel:  Recent Labs Lab 09/10/13 0420 09/10/13 1600 09/11/13 0412  NA 140 138 142  K 4.5 4.0 3.6*  CL 102 102 104  CO2 18* 19 23  GLUCOSE 145* 104* 107*  BUN 84* 70* 41*  CREATININE 3.94* 3.00* 1.87*  CALCIUM 7.2* 7.4* 7.8*  PHOS 7.6* 5.7* 3.2   Liver Function Tests:  Recent Labs Lab 09/07/13 2047 09/08/13 0500 09/09/13 0339 09/10/13 1600  AST 32  --   --   --   ALT 17  --   --   --   ALKPHOS 140*  --   --   --   BILITOT 0.6  --   --   --   PROT 7.4  --   --   --   ALBUMIN 2.5* 2.0* 1.6* 1.6*    Recent Labs Lab 09/07/13 2047  LIPASE 127*   No results found for this basename: AMMONIA,  in the last 168 hours CBC:  Recent Labs Lab 09/07/13 2047  09/08/13 1637 09/09/13 0339 09/09/13 1730 09/10/13 0420 09/11/13 0412  WBC 33.5*  < > 11.5* 17.6* 13.9* 11.5* 11.1*  NEUTROABS 30.8*  --   --   --   --   --   --   HGB  8.0*  < > 8.6* 9.2* 7.2* 6.6* 9.5*  HCT 23.3*  < > 25.6* 26.4* 20.8* 19.1* 26.8*  MCV 92.8  < > 93.4 93.3 93.7 93.2 89.0  PLT 529*  < > 244 264 211 170 123*  < > = values in this interval not displayed. Cardiac Enzymes:  Recent Labs Lab 09/08/13 0020 09/08/13 0225 09/08/13 1116  CKTOTAL 47  --   --   TROPONINI <0.30 <0.30 <0.30   CBG:  Recent Labs Lab 09/10/13 1930 09/10/13 1933 09/10/13 2342 09/11/13 0331 09/11/13 0711  GLUCAP 43* 122* 82 81 110*    Iron Studies: No results found for this basename: IRON, TIBC, TRANSFERRIN, FERRITIN,  in the last 72 hours Studies/Results: Dg Chest Port 1 View  09/10/2013   CLINICAL DATA:  Respiratory failure.  EXAM: PORTABLE CHEST - 1 VIEW  COMPARISON:  DG CHEST 1V PORT dated 09/08/2013; DG CHEST 1V PORT dated 09/07/2013  FINDINGS: Endotracheal tube remains with the tip approximately 6 cm above the carina. Right jugular central line positioning is stable. Nasogastric tube  extends into the stomach. There is no evidence of pulmonary edema, consolidation, pneumothorax, nodule or pleural fluid. The heart size is normal.  IMPRESSION: No active disease.   Electronically Signed   By: Aletta Edouard M.D.   On: 09/10/2013 08:47   Medications: Infusions: . sodium chloride 20 mL/hr (09/10/13 1803)  . amiodarone (NEXTERONE PREMIX) 360 mg/200 mL dextrose 60 mg/hr (09/11/13 0700)   Followed by  . amiodarone (NEXTERONE PREMIX) 360 mg/200 mL dextrose    . feeding supplement (VITAL AF 1.2 CAL) 1,000 mL (09/11/13 0756)  . norepinephrine (LEVOPHED) Adult infusion 4 mcg/min (09/11/13 0700)  . norepinephrine (LEVOPHED) Adult infusion Stopped (09/08/13 2200)  . dialysis replacement fluid (prismasate) 250 mL/hr at 09/10/13 1623  . dialysis replacement fluid (prismasate) 250 mL/hr at 09/10/13 1224  . dialysate (PRISMASATE) 1,500 mL/hr at 09/11/13 0601  . vasopressin (PITRESSIN) infusion - *FOR SHOCK* Stopped (09/11/13 0200)    Scheduled Medications: . antiseptic oral rinse  15 mL Mouth Rinse QID  . chlorhexidine  15 mL Mouth Rinse BID  . clindamycin (CLEOCIN) IV  600 mg Intravenous 4 times per day  . collagenase   Topical Daily  . darbepoetin (ARANESP) injection - DIALYSIS  100 mcg Intravenous Q Sat-HD  . insulin aspart  0-9 Units Subcutaneous 6 times per day  . metoCLOPramide (REGLAN) injection  5 mg Intravenous 3 times per day  . pantoprazole (PROTONIX) IV  40 mg Intravenous Q12H  . piperacillin-tazobactam (ZOSYN)  IV  2.25 g Intravenous 4 times per day  . thiamine  100 mg Intravenous Daily  . vancomycin  500 mg Intravenous Q24H    have reviewed scheduled and prn medications.  Physical Exam: General: on vent Heart: tachy Lungs: mostly clear Abdomen: soft, tender epigastric area- slightly distended Extremities: no edema-  S/p bka Dialysis Access: right  IJ temp cath- placed 2/12   09/11/2013,8:26 AM  LOS: 4 days

## 2013-09-11 NOTE — Progress Notes (Signed)
eLink Physician-Brief Progress Note Patient Name: Edwin [name] DOB: 12-Oct-1955 MRN: [id number]  Date of Service  09/11/2013   HPI/Events of Note  AFRVR   eICU Interventions  Amiodarone bolus and infusion per protocol   Intervention Category Major Interventions: Arrhythmia - evaluation and management  Merton Border 09/11/2013, 5:21 AM

## 2013-09-11 NOTE — Progress Notes (Signed)
  Amiodarone Drug - Drug Interaction Consult Note  Recommendations: NO CLEAR INTERACTIONS WITH CURRENT MEDS. Amiodarone is metabolized by the cytochrome P450 system and therefore has the potential to cause many drug interactions. Amiodarone has an average plasma half-life of 50 days (range 20 to 100 days).   There is potential for drug interactions to occur several weeks or months after stopping treatment and the onset of drug interactions may be slow after initiating amiodarone.   []  Statins: Increased risk of myopathy. Simvastatin- restrict dose to 20mg  daily. Other statins: counsel patients to report any muscle pain or weakness immediately.  []  Anticoagulants: Amiodarone can increase anticoagulant effect. Consider warfarin dose reduction. Patients should be monitored closely and the dose of anticoagulant altered accordingly, remembering that amiodarone levels take several weeks to stabilize.  []  Antiepileptics: Amiodarone can increase plasma concentration of phenytoin, the dose should be reduced. Note that small changes in phenytoin dose can result in large changes in levels. Monitor patient and counsel on signs of toxicity.  []  Beta blockers: increased risk of bradycardia, AV block and myocardial depression. Sotalol - avoid concomitant use.  []   Calcium channel blockers (diltiazem and verapamil): increased risk of bradycardia, AV block and myocardial depression.  []   Cyclosporine: Amiodarone increases levels of cyclosporine. Reduced dose of cyclosporine is recommended.  []  Digoxin dose should be halved when amiodarone is started.  []  Diuretics: increased risk of cardiotoxicity if hypokalemia occurs.  []  Oral hypoglycemic agents (glyburide, glipizide, glimepiride): increased risk of hypoglycemia. Patient's glucose levels should be monitored closely when initiating amiodarone therapy.   []  Drugs that prolong the QT interval:  Torsades de pointes risk may be increased with concurrent use  - avoid if possible.  Monitor QTc, also keep magnesium/potassium WNL if concurrent therapy can't be avoided. Marland Kitchen Antibiotics: e.g. fluoroquinolones, erythromycin. . Antiarrhythmics: e.g. quinidine, procainamide, disopyramide, sotalol. . Antipsychotics: e.g. phenothiazines, haloperidol.  . Lithium, tricyclic antidepressants, and methadone.   Thank You,   Wynona Neat, PharmD, BCPS  09/11/2013 5:30 AM

## 2013-09-11 NOTE — Progress Notes (Deleted)
Family reversed code status to "full" after discussing with patient.  RN aware.  Orders placed. 

## 2013-09-12 ENCOUNTER — Inpatient Hospital Stay (HOSPITAL_COMMUNITY): Payer: Medicaid Other

## 2013-09-12 ENCOUNTER — Encounter (HOSPITAL_COMMUNITY): Payer: Self-pay | Admitting: *Deleted

## 2013-09-12 DIAGNOSIS — F411 Generalized anxiety disorder: Secondary | ICD-10-CM

## 2013-09-12 LAB — RENAL FUNCTION PANEL
ALBUMIN: 1.6 g/dL — AB (ref 3.5–5.2)
ALBUMIN: 1.4 g/dL — AB (ref 3.5–5.2)
BUN: 20 mg/dL (ref 6–23)
BUN: 17 mg/dL (ref 6–23)
CALCIUM: 7.6 mg/dL — AB (ref 8.4–10.5)
CHLORIDE: 100 meq/L (ref 96–112)
CHLORIDE: 99 meq/L (ref 96–112)
CO2: 25 mEq/L (ref 19–32)
CO2: 24 meq/L (ref 19–32)
CREATININE: 1.14 mg/dL (ref 0.50–1.35)
Calcium: 7.9 mg/dL — ABNORMAL LOW (ref 8.4–10.5)
Creatinine, Ser: 1.08 mg/dL (ref 0.50–1.35)
GFR calc non Af Amer: 75 mL/min — ABNORMAL LOW (ref >90)
GFR calc non Af Amer: 70 mL/min — ABNORMAL LOW (ref >90)
GFR, EST AFRICAN AMERICAN: 87 mL/min — AB (ref >90)
GFR, EST AFRICAN AMERICAN: 81 mL/min — AB (ref >90)
GLUCOSE: 116 mg/dL — AB (ref 70–99)
Glucose, Bld: 90 mg/dL (ref 70–99)
PHOSPHORUS: 1.8 mg/dL — AB (ref 2.3–4.6)
Phosphorus: 1.9 mg/dL — ABNORMAL LOW (ref 2.3–4.6)
Potassium: 4 mEq/L (ref 3.7–5.3)
Potassium: 3.9 mEq/L (ref 3.7–5.3)
SODIUM: 135 meq/L — AB (ref 137–147)
Sodium: 135 mEq/L — ABNORMAL LOW (ref 137–147)

## 2013-09-12 LAB — POCT I-STAT 3, ART BLOOD GAS (G3+)
Acid-base deficit: 10 mmol/L — ABNORMAL HIGH (ref 0.0–2.0)
Bicarbonate: 13.2 mEq/L — ABNORMAL LOW (ref 20.0–24.0)
O2 SAT: 99 %
TCO2: 14 mmol/L (ref 0–100)
pCO2 arterial: 18.5 mmHg — CL (ref 35.0–45.0)
pH, Arterial: 7.456 — ABNORMAL HIGH (ref 7.350–7.450)
pO2, Arterial: 130 mmHg — ABNORMAL HIGH (ref 80.0–100.0)

## 2013-09-12 LAB — POCT I-STAT 7, (LYTES, BLD GAS, ICA,H+H)
ACID-BASE DEFICIT: 5 mmol/L — AB (ref 0.0–2.0)
Bicarbonate: 20.5 mEq/L (ref 20.0–24.0)
Calcium, Ion: 0.99 mmol/L — ABNORMAL LOW (ref 1.12–1.23)
HCT: 26 % — ABNORMAL LOW (ref 39.0–52.0)
Hemoglobin: 8.8 g/dL — ABNORMAL LOW (ref 13.0–17.0)
O2 SAT: 93 %
POTASSIUM: 4.8 meq/L (ref 3.7–5.3)
Patient temperature: 36.9
Sodium: 136 mEq/L — ABNORMAL LOW (ref 137–147)
TCO2: 22 mmol/L (ref 0–100)
pCO2 arterial: 36.5 mmHg (ref 35.0–45.0)
pH, Arterial: 7.356 (ref 7.350–7.450)
pO2, Arterial: 67 mmHg — ABNORMAL LOW (ref 80.0–100.0)

## 2013-09-12 LAB — MAGNESIUM
Magnesium: 2.3 mg/dL (ref 1.5–2.5)
Magnesium: 2.1 mg/dL (ref 1.5–2.5)

## 2013-09-12 LAB — GLUCOSE, CAPILLARY
GLUCOSE-CAPILLARY: 122 mg/dL — AB (ref 70–99)
GLUCOSE-CAPILLARY: 116 mg/dL — AB (ref 70–99)
GLUCOSE-CAPILLARY: 119 mg/dL — AB (ref 70–99)
Glucose-Capillary: 117 mg/dL — ABNORMAL HIGH (ref 70–99)
Glucose-Capillary: 131 mg/dL — ABNORMAL HIGH (ref 70–99)
Glucose-Capillary: 28 mg/dL — CL (ref 70–99)
Glucose-Capillary: 43 mg/dL — CL (ref 70–99)
Glucose-Capillary: 75 mg/dL (ref 70–99)
Glucose-Capillary: 89 mg/dL (ref 70–99)

## 2013-09-12 LAB — STOOL CULTURE: Special Requests: NORMAL

## 2013-09-12 LAB — APTT: APTT: 37 s (ref 24–37)

## 2013-09-12 LAB — VANCOMYCIN, TROUGH: VANCOMYCIN TR: 7.8 ug/mL — AB (ref 10.0–20.0)

## 2013-09-12 LAB — CORTISOL: CORTISOL PLASMA: 24.6 ug/dL

## 2013-09-12 MED ORDER — IOHEXOL 300 MG/ML  SOLN
25.0000 mL | INTRAMUSCULAR | Status: AC
  Administered 2013-09-12 (×2): 25 mL via ORAL

## 2013-09-12 MED ORDER — ETOMIDATE 2 MG/ML IV SOLN
INTRAVENOUS | Status: DC
Start: 2013-09-12 — End: 2013-09-12
  Filled 2013-09-12: qty 10

## 2013-09-12 MED ORDER — MIDAZOLAM HCL 2 MG/2ML IJ SOLN
0.5000 mg | Freq: Once | INTRAMUSCULAR | Status: AC
  Administered 2013-09-12: 2 mg via INTRAVENOUS
  Filled 2013-09-12: qty 2

## 2013-09-12 MED ORDER — SODIUM CHLORIDE 0.9 % IV SOLN
Freq: Once | INTRAVENOUS | Status: AC
  Administered 2013-09-12: 11:00:00 via INTRAVENOUS

## 2013-09-12 NOTE — Progress Notes (Signed)
Changed patient tube holder without any complications.  Noted that tube was placed at 27 at the lip, pulled back to original 24 at lip.  Bilateral breath sounds noted.  RT will continue to monitor.

## 2013-09-12 NOTE — Clinical Social Work Note (Addendum)
Pt was recently seen at [facility]. Pt was placed in [facility] project home to be followed by [place] [facility] [place] [facility].  Director is aware that pt has been re-admitted.  Plan at this point for disposition: would be for pt to return once medically dc.  CSW will continue to follow.  Nonnie Done, [place] [phone]  Clinical Social Work

## 2013-09-12 NOTE — Progress Notes (Signed)
ABG results shown to Dr. Titus Mould.  Verbal Orders given to decrease respiratory rate to 20.

## 2013-09-12 NOTE — Progress Notes (Addendum)
[facility] Progress Note  09/12/2013 10:08 AM POD 3  Subjective:  Intubated but alert; on CVVHD   Filed Vitals:   09/12/13 0900  BP: 108/93  Pulse:   Temp: 97 F (36.1 C)  Resp: 18    Physical Exam: Incisions:  Covered and dressing is clean and in tact.   CBC    Component Value Date/Time   WBC 11.1* 09/11/2013 0412   RBC 3.01* 09/11/2013 0412   HGB 9.5* 09/11/2013 0412   HCT 26.8* 09/11/2013 0412   PLT 123* 09/11/2013 0412   MCV 89.0 09/11/2013 0412   MCH 31.6 09/11/2013 0412   MCHC 35.4 09/11/2013 0412   RDW 17.0* 09/11/2013 0412   LYMPHSABS 0.7 09/07/2013 2047   MONOABS 2.0* 09/07/2013 2047   EOSABS 0.0 09/07/2013 2047   BASOSABS 0.0 09/07/2013 2047    BMET    Component Value Date/Time   NA 135* 09/12/2013 0350   K 4.0 09/12/2013 0350   CL 100 09/12/2013 0350   CO2 25 09/12/2013 0350   GLUCOSE 116* 09/12/2013 0350   BUN 20 09/12/2013 0350   CREATININE 1.08 09/12/2013 0350   CALCIUM 7.9* 09/12/2013 0350   GFRNONAA 75* 09/12/2013 0350   GFRAA 87* 09/12/2013 0350    INR    Component Value Date/Time   INR 1.37 09/09/2013 0339     Intake/Output Summary (Last 24 hours) at 09/12/13 1008 Last data filed at 09/12/13 0900  Gross per 24 hour  Intake   2009 ml  Output   3400 ml  Net  -1391 ml     Assessment/Plan:  58 y.o. male is s/p right above knee amputation  POD 3   -right stump wrapped and dressing is clean.  Will take down tomorrow and check wound.  It was checked yesterday.   Leontine Locket, PA-C [facility] [phone] 09/12/2013 10:08 AM           I have examined the patient, reviewed and agree with above.  Maleah Rabago, MD 09/12/2013 11:45 AM

## 2013-09-12 NOTE — Progress Notes (Signed)
Inpatient Diabetes Program Recommendations  AACE/ADA: New Consensus Statement on Inpatient Glycemic Control (2013)  Target Ranges:  Prepandial:   less than 140 mg/dL      Peak postprandial:   less than 180 mg/dL (1-2 hours)      Critically ill patients:  140 - 180 mg/dL  Results for TEJAS, SEAWOOD (MRN [id number]) as of 09/12/2013 09:22  Ref. Range 09/11/2013 23:56 09/12/2013 03:41 09/12/2013 03:43 09/12/2013 07:48 09/12/2013 07:57  Glucose-Capillary Latest Range: 70-99 mg/dL 131 (H) 42 (LL) 117 (H) 28 (LL) 116 (H)   Inpatient Diabetes Program Recommendations Correction (SSI): discontinue Novolog  Thank you  Raoul Pitch BSN, RN,CDE Inpatient Diabetes Coordinator [phone] (team pager)

## 2013-09-12 NOTE — Progress Notes (Signed)
Patient ID: RICKEY SADOWSKI, male   DOB: 11-Oct-1955, 58 y.o.   MRN: [id number] S:Pt intubated but awake O:BP 116/76  Pulse 67  Temp(Src) 97 F (36.1 C) (Core (Comment))  Resp 20  Ht 5\' 9"  (1.753 m)  Wt 53.3 kg (117 lb 8.1 oz)  BMI 17.34 kg/m2  SpO2 100%  Intake/Output Summary (Last 24 hours) at 09/12/13 1026 Last data filed at 09/12/13 1000  Gross per 24 hour  Intake 2177.4 ml  Output   3484 ml  Net -1306.6 ml   Intake/Output: I/O last 3 completed shifts: In: 3623 [I.V.:1893; NG/GT:830; IV Piggyback:900] Out: 4098 [Urine:423; Emesis/NG output:3150; Other:2722]  Intake/Output this shift:  Total I/O In: 235.1 [I.V.:235.1] Out: 136 [Urine:23; Other:113] Weight change: 2.2 kg (4 lb 13.6 oz) JXB:JYNWGNFAO WM intubated in NAd CVS:no rub Resp:occ exp wheezes Abd:+BS, soft Ext:s/p right AKA   Recent Labs Lab 09/07/13 2047  09/08/13 0500  09/09/13 0339 09/09/13 0800 09/09/13 0930 09/09/13 1730 09/10/13 0420 09/10/13 1600 09/11/13 0412 09/11/13 1600 09/12/13 0350  NA 124*  < > 139  < > 136*  --  136* 139 140 138 142 140 135*  K >7.7*  < > 5.0  < > 5.3  --  4.8 5.3 4.5 4.0 3.6* 4.2 4.0  CL 81*  < > 96  < > 97  --   --  101 102 102 104 103 100  CO2 14*  < > 24  < > 21  --   --  19 18* 19 23 23 25   GLUCOSE 92  < > 111*  < > 126*  --   --  137* 145* 104* 107* 131* 116*  BUN 106*  < > 80*  < > 82*  --   --  84* 84* 70* 41* 29* 20  CREATININE 5.75*  < > 3.97*  < > 3.87*  --   --  4.00* 3.94* 3.00* 1.87* 1.38* 1.08  ALBUMIN 2.5*  --  2.0*  --  1.6*  --   --   --   --  1.6*  --  1.7* 1.6*  CALCIUM 9.6  < > 7.6*  < > 7.3*  --   --  7.5* 7.2* 7.4* 7.8* 8.0* 7.9*  PHOS  --   < > 6.3*  --  7.8* 9.0*  --  8.7* 7.6* 5.7* 3.2 2.6 1.8*  AST 32  --   --   --   --   --   --   --   --   --   --   --   --   ALT 17  --   --   --   --   --   --   --   --   --   --   --   --   < > = values in this interval not displayed. Liver Function Tests:  Recent Labs Lab 09/07/13 2047   09/10/13 1600 09/11/13 1600 09/12/13 0350  AST 32  --   --   --   --   ALT 17  --   --   --   --   ALKPHOS 140*  --   --   --   --   BILITOT 0.6  --   --   --   --   PROT 7.4  --   --   --   --   ALBUMIN 2.5*  < > 1.6* 1.7* 1.6*  < > =  values in this interval not displayed.  Recent Labs Lab 09/07/13 2047  LIPASE 127*   No results found for this basename: AMMONIA,  in the last 168 hours CBC:  Recent Labs Lab 09/07/13 2047  09/08/13 1637 09/09/13 0339  09/09/13 1730 09/10/13 0420 09/11/13 0412  WBC 33.5*  < > 11.5* 17.6*  --  13.9* 11.5* 11.1*  NEUTROABS 30.8*  --   --   --   --   --   --   --   HGB 8.0*  < > 8.6* 9.2*  < > 7.2* 6.6* 9.5*  HCT 23.3*  < > 25.6* 26.4*  < > 20.8* 19.1* 26.8*  MCV 92.8  < > 93.4 93.3  --  93.7 93.2 89.0  PLT 529*  < > 244 264  --  211 170 123*  < > = values in this interval not displayed. Cardiac Enzymes:  Recent Labs Lab 09/08/13 0020 09/08/13 0225 09/08/13 1116  CKTOTAL 4  --   --   TROPONINI <0.30 <0.30 <0.30   CBG:  Recent Labs Lab 09/11/13 2356 09/12/13 0341 09/12/13 0343 09/12/13 0748 09/12/13 0757  GLUCAP 131* 42* 117* 28* 116*    Iron Studies: No results found for this basename: IRON, TIBC, TRANSFERRIN, FERRITIN,  in the last 72 hours Studies/Results: Dg Chest Port 1 View  09/12/2013   CLINICAL DATA:  Intubation, history COPD, hypertension, coronary artery disease, CHF, asthma, smoking  EXAM: PORTABLE CHEST - 1 VIEW  COMPARISON:  Portable exam 0757 hr compared to 09/10/2013  FINDINGS: Tip of endotracheal tube projects 6.3 cm above carinal.  Nasogastric tube extends into stomach.  Right jugular central venous catheter tip projects over proximal SVC.  Normal heart size, mediastinal contours, and pulmonary vascularity.  Atherosclerotic calcifications aorta.  Emphysematous changes without infiltrate, pleural effusion, or pneumothorax.  Right glenohumeral degenerative changes.  IMPRESSION: No acute abnormalities.    Electronically Signed   By: Lavonia Dana M.D.   On: 09/12/2013 08:07   Dg Abd Portable 1v  09/12/2013   CLINICAL DATA:  Abdominal distension, evaluate small bowel obstruction an NG tube positioning  EXAM: PORTABLE ABDOMEN - 1 VIEW  COMPARISON:  DG ABD PORTABLE 1V dated 09/11/2013  FINDINGS: Interval resolution of previously noted marked gas distention of the stomach. Enteric tube tip and side port overlie the expected location of the gastric fundus. There is persistent marked gaseous distention of multiple loops of small bowel within next loop in the left mid hemiabdomen measuring approximately 4.2 cm in diameter. These findings are associated with a conspicuous paucity of distal colonic gas.  No supine evidence of pneumoperitoneum. No pneumatosis or portal venous gas.  Limited visualization the lower thorax is unremarkable.  Multi-level thoracolumbar spine degenerative changes suspected. Vascular calcifications overlie the lower pelvis. Presumed bone island overlies the right ilium.  IMPRESSION: 1. Grossly unchanged findings worrisome for small bowel obstruction. 2. Decreased gaseous distention of the stomach. Enteric tube tip and side port remain over the gastric fundus.   Electronically Signed   By: Sandi Mariscal M.D.   On: 09/12/2013 07:38   Dg Abd Portable 1v  09/11/2013   CLINICAL DATA:  Abdominal distention.  EXAM: PORTABLE ABDOMEN - 1 VIEW  COMPARISON:  None.  FINDINGS: NG tube is in place with the side-port in the stomach. The stomach is markedly distended and there is distention of small bowel loops up to 5.4 cm. Little to no gas in the colon is seen.  IMPRESSION: Bowel gas pattern  worrisome for small bowel obstruction with dilatation of small bowel and marked gaseous distention of the stomach.   Electronically Signed   By: Inge Rise M.D.   On: 09/11/2013 19:10   . antiseptic oral rinse  15 mL Mouth Rinse QID  . chlorhexidine  15 mL Mouth Rinse BID  . clindamycin (CLEOCIN) IV  600 mg  Intravenous 4 times per day  . collagenase   Topical Daily  . darbepoetin (ARANESP) injection - DIALYSIS  100 mcg Intravenous Q Sat-HD  . insulin aspart  0-9 Units Subcutaneous 6 times per day  . metoCLOPramide (REGLAN) injection  5 mg Intravenous 3 times per day  . pantoprazole (PROTONIX) IV  40 mg Intravenous Q12H  . piperacillin-tazobactam (ZOSYN)  IV  2.25 g Intravenous 4 times per day  . thiamine  100 mg Intravenous Daily  . vancomycin  500 mg Intravenous Q24H    BMET    Component Value Date/Time   NA 135* 09/12/2013 0350   K 4.0 09/12/2013 0350   CL 100 09/12/2013 0350   CO2 25 09/12/2013 0350   GLUCOSE 116* 09/12/2013 0350   BUN 20 09/12/2013 0350   CREATININE 1.08 09/12/2013 0350   CALCIUM 7.9* 09/12/2013 0350   GFRNONAA 75* 09/12/2013 0350   GFRAA 87* 09/12/2013 0350   CBC    Component Value Date/Time   WBC 11.1* 09/11/2013 0412   RBC 3.01* 09/11/2013 0412   HGB 9.5* 09/11/2013 0412   HCT 26.8* 09/11/2013 0412   PLT 123* 09/11/2013 0412   MCV 89.0 09/11/2013 0412   MCH 31.6 09/11/2013 0412   MCHC 35.4 09/11/2013 0412   RDW 17.0* 09/11/2013 0412   LYMPHSABS 0.7 09/07/2013 2047   MONOABS 2.0* 09/07/2013 2047   EOSABS 0.0 09/07/2013 2047   BASOSABS 0.0 09/07/2013 2047    Assessment/Plan:  1. Oliguric AKI in the setting of SIRS, NSAIDs and ACE and has required CRRT due to hyperkalemia and then continued this weekend due to increased need for pressor and minimal UOP.  CVP of 6 and keeping pt even.  Now off of pressors.  Will keep him +100/hr as he is intravascularly volume depleted and hopefully will see an increase in UOP and improved renal function 2. VDRF- per PCCM 3. PVD/gangrene s/p RAKA- on clinda/zosyn/vanc, follow vanc trough levels 4. ABLA- cont to follow, on aranesp 5. HTN/Volume- low CVP, no overt volume overload, will keep him + and hopefully get off of CVVHD in the next 24-48 hours 6. Protein malnutrition- unable to advance tube feeds. 7. Dispo- per  PCCM  Capucine Tryon A

## 2013-09-12 NOTE — Care Management Note (Signed)
    Page 1 of 1   09/12/2013     2:11:56 PM   CARE MANAGEMENT NOTE 09/12/2013  Patient:  Edwin [name], Edwin [name]   Account Number:  [id number]  Date Initiated:  09/08/2013  Documentation initiated by:  Luz Lex  Subjective/Objective Assessment:   Sepsis - LLE - possible need for amputation     Action/Plan:   Anticipated DC Date:  09/15/2013   Anticipated DC Plan:  [place]  CM consult      Choice offered to / List presented to:             Status of service:  In process, will continue to follow Medicare Important Message given?   (If response is "NO", the following Medicare IM given date fields will be blank) Date Medicare IM given:   Date Additional Medicare IM given:    Discharge Disposition:    Per UR Regulation:  Reviewed for med. necessity/level of care/duration of stay  If discussed at [place] of Stay Meetings, dates discussed:    Comments:  Contact:  Shenk,Carol Other [phone]  09-12-13 [place], RNBSN [phone] Last discharged to [facility].  Readmitted septic.  Now on CRRT, vent, pressors - post leg amputation.  Will benefit from rehab when able.  CM wil continue to follow.

## 2013-09-12 NOTE — Procedures (Signed)
Central Venous Catheter Insertion Procedure Note RASHAWN RAYMAN [id number] 1955-10-23  Procedure: Insertion of Central Venous Catheter Indications: Assessment of intravascular volume, Drug and/or fluid administration and Frequent blood sampling  Procedure Details Consent: Risks of procedure as well as the alternatives and risks of each were explained to the (patient/caregiver).  Consent for procedure obtained. Time Out: Verified patient identification, verified procedure, site/side was marked, verified correct patient position, special equipment/implants available, medications/allergies/relevent history reviewed, required imaging and test results available.  Performed  Maximum sterile technique was used including antiseptics, cap, gloves, gown, hand hygiene, mask and sheet. Skin prep: Chlorhexidine; local anesthetic administered A antimicrobial bonded/coated triple lumen catheter was placed in the left internal jugular vein using the Seldinger technique.  Evaluation Blood flow good Complications: Complications of carotid puncture with hematoma Patient did tolerate procedure well. Chest X-ray ordered to verify placement.  CXR: pending.  Tommi Rumps 09/12/2013, 2:40 PM  [place] toerated  [name]. Titus Mould, MD, [place] Pgr: [facility]

## 2013-09-12 NOTE — Progress Notes (Signed)
NUTRITION FOLLOW UP  DOCUMENTATION CODES  Per approved criteria   -Severe malnutrition in the context of chronic illness  -Underweight    Intervention:   When able to resume TF, recommend Vital AF 1.2 at 20 ml/h trophic rate; once tolerance is established, increase to goal rate of 55 ml/h (1320 ml per day) to provide 1584 kcals, 99 gm protein, 1071 ml free water daily. Monitor magnesium, potassium, and phosphorus daily for at least 3 days, MD to replete as needed, as pt is at risk for refeeding syndrome given severe PCM.  Nutrition Dx:   Inadequate oral intake related to inability to eat as evidenced by NPO status. Ongoing.  Goal:   Intake to meet >90% of estimated nutrition needs. Unmet.  Monitor:   TF initiation/tolerance/adequacy, weight trend, labs, vent status.  Assessment:   57 year old male w/ severe PAD. Admitted on 2/12 w/ severe sepsis/ septic shock, acute renal failure, metabolic acidosis and hyperkalemia in setting of gangrenous non-healing RLE ulcer. S/P right AKA on 2/13.  Pt meets criteria for severe MALNUTRITION in the context of chronic illness as evidenced by severe depletion of muscle mass and 14% weight loss in the past month.   Patient continues to receive CRRT. TF is currently on hold due to possible SBO. CT of abdomen has been ordered. Discussed patient in ICU rounds today. No plans to start TPN at this time.  Patient is currently intubated on ventilator support.  MV: 12.6 L/min Temp (24hrs), Avg:97 F (36.1 C), Min:94.9 F (34.9 C), Max:97.6 F (36.4 C)   Height: Ht Readings from Last 1 Encounters:  09/08/13 5\' 9"  (1.753 m)    Weight Status:   Wt Readings from Last 1 Encounters:  09/12/13 117 lb 8.1 oz (53.3 kg)    Re-estimated needs:  Kcal: 1560 Protein: 105-130 gm Fluid: 1.7 L  Skin: unstageable pressure ulcer to left thigh; stage 3 pressure ulcer to sacrum, unstageable pressure ulcer to right buttock, unstageable pressure ulcer right knee,  unstageable pressure ulcer left knee, unstageable pressure ulcer sacrum, right leg BKA  Diet Order:  NPO   Intake/Output Summary (Last 24 hours) at 09/12/13 1335 Last data filed at 09/12/13 1300  Gross per 24 hour  Intake 1867.9 ml  Output   3157 ml  Net -1289.1 ml    Last BM: 2/11   Labs:   Recent Labs Lab 09/11/13 0412 09/11/13 1600 09/11/13 1700 09/12/13 0350  NA 142 140  --  135*  K 3.6* 4.2  --  4.0  CL 104 103  --  100  CO2 23 23  --  25  BUN 41* 29*  --  20  CREATININE 1.87* 1.38*  --  1.08  CALCIUM 7.8* 8.0*  --  7.9*  MG 2.2  --  2.3 2.3  PHOS 3.2 2.6  --  1.8*  GLUCOSE 107* 131*  --  116*    CBG (last 3)   Recent Labs  09/12/13 0748 09/12/13 0757 09/12/13 1122  GLUCAP 28* 116* 119*    Scheduled Meds: . sodium chloride   Intravenous Once  . antiseptic oral rinse  15 mL Mouth Rinse QID  . chlorhexidine  15 mL Mouth Rinse BID  . collagenase   Topical Daily  . darbepoetin (ARANESP) injection - DIALYSIS  100 mcg Intravenous Q Sat-HD  . insulin aspart  0-9 Units Subcutaneous 6 times per day  . metoCLOPramide (REGLAN) injection  5 mg Intravenous 3 times per day  . pantoprazole (  PROTONIX) IV  40 mg Intravenous Q12H  . piperacillin-tazobactam (ZOSYN)  IV  2.25 g Intravenous 4 times per day  . thiamine  100 mg Intravenous Daily  . vancomycin  500 mg Intravenous Q24H    Continuous Infusions: . sodium chloride 20 mL/hr (09/10/13 1803)  . amiodarone (NEXTERONE PREMIX) 360 mg/200 mL dextrose 30 mg/hr (09/12/13 1314)  . dextrose 50 mL/hr at 09/12/13 1315  . feeding supplement (VITAL AF 1.2 CAL) 1,000 mL (09/11/13 0756)  . fentaNYL infusion INTRAVENOUS 50 mcg/hr (09/12/13 1112)  . norepinephrine (LEVOPHED) Adult infusion 2 mcg/min (09/11/13 0900)  . norepinephrine (LEVOPHED) Adult infusion Stopped (09/08/13 2200)  . dialysis replacement fluid (prismasate) 250 mL/hr at 09/12/13 0629  . dialysis replacement fluid (prismasate) 250 mL/hr at 09/12/13 1791   . dialysate (PRISMASATE) 1,500 mL/hr at 09/12/13 1315  . vasopressin (PITRESSIN) infusion - *FOR SHOCK* Stopped (09/12/13 0100)    Molli Barrows, RD, LDN, [place] Pager [phone] After Hours Pager [phone]

## 2013-09-12 NOTE — Progress Notes (Signed)
UR Completed.  Edwin [name] [phone] 09/12/2013  

## 2013-09-12 NOTE — Progress Notes (Signed)
PULMONARY / CRITICAL CARE PROGRESS NOTE  Name: Edwin [name] MRN: [id number] DOB: 1955/08/06    ADMISSION DATE: 09/07/2013   PRIMARY SERVICE: PCCM  CHIEF COMPLAINT: Sepsis   BRIEF PATIENT DESCRIPTION: 58 yo with severe PAD admitted 2/12 with septic shock, acute renal failure, metabolic acidosis and hyperkalemia in setting of gangrenous non-healing RLE ulcer.   SIGNIFICANT EVENTS / STUDIES:  2/12  R tib/fib XR >>> No osteo 2/12  R ankle >>> No acute process 2/12  Endoscopy / flex sig >>> No active bleed, large gastric residual, multiple shallow gastric ulcers without hemorrhage. Suspected nonsteroidal gastropathy vs gastric mucosal ischemia. 2/13  R AKA 2/14 Now full code 2/16- remains vented, shock, concern SBO  LINES / TUBES:  Foley cath 2/12 >>> RIJ  CVL 2/12 >>> ETT 2/13 >>> R rad a-line 2/13 >>>  CULTURES:  2/12  MRSA >>> neg 2/12  Stool >>> C. Diff negative, NGTD 2/12  Blood >>> NGTD 2/12  Urine >>> neg  ANTIBIOTICS:  Vancomycin 2/12 >>>  Zosyn 2/12 >>>  Clindamycin 2/12 >>> 2/16  INTERVAL HISTORY:   High gastric output to NG. Remains intubated. Off pressors sine 1 am.   VITAL SIGNS: Temp:  [94.9 F (34.9 C)-97.7 F (36.5 C)] 96.9 F (36.1 C) (02/16 0600) Pulse Rate:  [50-130] 73 (02/16 0600) Resp:  [17-28] 24 (02/16 0600) BP: (81-160)/(54-103) 105/72 mmHg (02/16 0600) SpO2:  [59 %-100 %] 100 % (02/16 0600) Arterial Line BP: (101-186)/(50-95) 106/54 mmHg (02/16 0600) FiO2 (%):  [40 %] 40 % (02/16 0600) Weight:  [117 lb 8.1 oz (53.3 kg)] 117 lb 8.1 oz (53.3 kg) (02/16 0400)  HEMODYNAMICS: CVP:  [5 mmHg-8 mmHg] 6 mmHg VENTILATOR SETTINGS: Vent Mode:  [-] PRVC FiO2 (%):  [40 %] 40 % Set Rate:  [24 bmp] 24 bmp Vt Set:  [550 mL] 550 mL PEEP:  [5 cmH20] 5 cmH20 Plateau Pressure:  [17 cmH20-22 cmH20] 18 cmH20  INTAKE / OUTPUT: Intake/Output     02/15 0701 - 02/16 0700   I.V. (mL/kg) 1352.1 (25.4)   NG/GT 150   IV Piggyback 650   Total Intake(mL/kg)  2152.1 (40.4)   Urine (mL/kg/hr) 233 (0.2)   Emesis/NG output 1650 (1.3)   Other 1748 (1.4)   Total Output 3631   Net -1478.9        PHYSICAL EXAMINATION: General:  Mechanically ventilated, autoPEEP Neuro:  Appears anxious, follows commands HEENT:  PERRL, OETT / OGT, poor dentition Cardiovascular:  Tachycardic, irregular, no m/r/g Lungs:  Bilateral very diminished air entry Abdomen:  Soft, nontender, bowel sounds diminished Musculoskeletal:  No edema, R BKA Skin:  Sacral / gluteal decubiti   LABS:  CBC  Recent Labs Lab 09/09/13 1730 09/10/13 0420 09/11/13 0412  WBC 13.9* 11.5* 11.1*  HGB 7.2* 6.6* 9.5*  HCT 20.8* 19.1* 26.8*  PLT 211 170 123*   Coag's  Recent Labs Lab 09/08/13 0020 09/09/13 0339 09/11/13 0412 09/12/13 0350  APTT  --   --  36 37  INR 1.31 1.37  --   --    BMET  Recent Labs Lab 09/11/13 0412 09/11/13 1600 09/12/13 0350  NA 142 140 135*  K 3.6* 4.2 4.0  CL 104 103 100  CO2 23 23 25   BUN 41* 29* 20  CREATININE 1.87* 1.38* 1.08  GLUCOSE 107* 131* 116*   Electrolytes  Recent Labs Lab 09/11/13 0412 09/11/13 1600 09/11/13 1700 09/12/13 0350  CALCIUM 7.8* 8.0*  --  7.9*  MG 2.2  --  2.3 2.3  PHOS 3.2 2.6  --  1.8*   Sepsis Markers  Recent Labs Lab 09/07/13 2149 09/08/13 0020  LATICACIDVEN 6.11* 5.8*  PROCALCITON  --  4.57   ABG  Recent Labs Lab 09/08/13 0559 09/09/13 1216 09/10/13 0405  PHART 7.474* 7.230* 7.365  PCO2ART 32.9* 49.0* 32.6*  PO2ART 51.1* 150.0* 92.4   Liver Enzymes  Recent Labs Lab 09/07/13 2047  09/10/13 1600 09/11/13 1600 09/12/13 0350  AST 32  --   --   --   --   ALT 17  --   --   --   --   ALKPHOS 140*  --   --   --   --   BILITOT 0.6  --   --   --   --   ALBUMIN 2.5*  < > 1.6* 1.7* 1.6*  < > = values in this interval not displayed. Cardiac Enzymes  Recent Labs Lab 09/08/13 0020 09/08/13 0225 09/08/13 1116  TROPONINI <0.30 <0.30 <0.30   Glucose  Recent Labs Lab 09/11/13 1507  09/11/13 1918 09/11/13 1921 09/11/13 2356 09/12/13 0341 09/12/13 0343  GLUCAP 90 45* 116* 131* 42* 117*   CXR: no defined infiltrates, ett wnl  ASSESSMENT / PLAN:  PULMONARY  A:  Acute respiratory failure COPD  Suspect very poor baseline pulmonary function P:  abg reviewed, slight reduction MV Treat abdomen Continuous mechanical support VAP bundle Daily SBT, wean cpap5 ps 5, goal 2 hrs Trend ABG/CXR Albuterol PRN  CARDIOVASCULAR  A:  Septic shock  AF/RVR S/p R AKA Hx HTN, PVD Hypovolemia? P:  Vascular following Goal MAP > 65 Levophed gtt discontinued yesterday Vasopressin gtt off since 1 am Amiodarone gtt Agree bolus Allow pos baalnce Need more access, place line  RENAL  A:  AKI Mild hypokalemia  P:  Renal following Trend BPM CVVHD with hep. Negative balance. Pt with large NG output.  UOP: 243/24h For pos baalnce  GASTROINTESTINAL  A:  Gastropathy Gastroparesis / high residuals Suspected GI hemorrhage, but no evidence on EGD / FS Nutrition GI Px SBO P:  GI following TF held d/t large residual SBO- gastric residual Protonix BID Reglan 5 q8h D10 50 mL/h for hypoglycemia Ct abdo /pelvis assess sbo Keep NGT suction lft  HEMATOLOGIC  A:  Anemia VTE Px P:  Trend CBC SCD Aranesp  INFECTIOUS  A:  RLE gangrene, r/o SBO P:  Abx / cx as above For CT Dc clinda Keep vanc, zosyn  ENDOCRINE  A:  Normoglycemic P:  CBG per TF protocol D10 @ 50 mL/hr Cortisol, 76 steroids not indicated CBG: 117- 131  NEUROLOGIC  A:  Acute encephalopathy Hx alcohol abuse Anxiety P:  Goal RASS 0 to -1 Fentanyl PRN Ativan scheduled, limit when able, delirium risk  May need versed Thiamine  Kuneff, Renee DO PGY-2 09/12/2013, 6:53 AM   Ccm tmie 30 min   I have fully examined this patient and agree with above findings.    And edited in full  Edwin [name]. Titus Mould, MD, [place] Pgr: [facility]

## 2013-09-12 NOTE — Progress Notes (Signed)
PT Hydrotherapy  Order for hydrotherapy to start this evening received. Will follow up tomorrow with hydrotherapy eval.  [facility] PT [phone]

## 2013-09-13 ENCOUNTER — Encounter (HOSPITAL_COMMUNITY): Payer: Self-pay | Admitting: Vascular Surgery

## 2013-09-13 ENCOUNTER — Inpatient Hospital Stay (HOSPITAL_COMMUNITY): Payer: Medicaid Other

## 2013-09-13 DIAGNOSIS — I519 Heart disease, unspecified: Secondary | ICD-10-CM

## 2013-09-13 LAB — POCT I-STAT 3, ART BLOOD GAS (G3+)
Acid-Base Excess: 2 mmol/L (ref 0.0–2.0)
BICARBONATE: 25.9 meq/L — AB (ref 20.0–24.0)
O2 Saturation: 96 %
PH ART: 7.45 (ref 7.350–7.450)
TCO2: 27 mmol/L (ref 0–100)
pCO2 arterial: 37.1 mmHg (ref 35.0–45.0)
pO2, Arterial: 74 mmHg — ABNORMAL LOW (ref 80.0–100.0)

## 2013-09-13 LAB — RENAL FUNCTION PANEL
Albumin: 1.4 g/dL — ABNORMAL LOW (ref 3.5–5.2)
BUN: 13 mg/dL (ref 6–23)
CALCIUM: 7.5 mg/dL — AB (ref 8.4–10.5)
CO2: 25 mEq/L (ref 19–32)
CREATININE: 0.98 mg/dL (ref 0.50–1.35)
Chloride: 100 mEq/L (ref 96–112)
GFR calc Af Amer: >90 mL/min (ref >90)
Glucose, Bld: 114 mg/dL — ABNORMAL HIGH (ref 70–99)
PHOSPHORUS: 1.6 mg/dL — AB (ref 2.3–4.6)
Potassium: 3.5 mEq/L — ABNORMAL LOW (ref 3.7–5.3)
Sodium: 136 mEq/L — ABNORMAL LOW (ref 137–147)

## 2013-09-13 LAB — GLUCOSE, CAPILLARY
GLUCOSE-CAPILLARY: 114 mg/dL — AB (ref 70–99)
Glucose-Capillary: 102 mg/dL — ABNORMAL HIGH (ref 70–99)
Glucose-Capillary: 110 mg/dL — ABNORMAL HIGH (ref 70–99)
Glucose-Capillary: 91 mg/dL (ref 70–99)
Glucose-Capillary: 94 mg/dL (ref 70–99)
Glucose-Capillary: 99 mg/dL (ref 70–99)

## 2013-09-13 LAB — BLOOD GAS, ARTERIAL
Acid-Base Excess: 2.6 mmol/L — ABNORMAL HIGH (ref 0.0–2.0)
Bicarbonate: 25.5 mEq/L — ABNORMAL HIGH (ref 20.0–24.0)
Drawn by: 312761
FIO2: 0.4 %
MECHVT: 550 mL
O2 Saturation: 98.5 %
PEEP/CPAP: 5 cmH2O
Patient temperature: 98.6
RATE: 20 resp/min
TCO2: 26.4 mmol/L (ref 0–100)
pCO2 arterial: 31.4 mmHg — ABNORMAL LOW (ref 35.0–45.0)
pH, Arterial: 7.52 — ABNORMAL HIGH (ref 7.350–7.450)
pO2, Arterial: 99.9 mmHg (ref 80.0–100.0)

## 2013-09-13 LAB — APTT: APTT: 36 s (ref 24–37)

## 2013-09-13 LAB — OCCULT BLOOD X 1 CARD TO LAB, STOOL: Fecal Occult Bld: POSITIVE — AB

## 2013-09-13 LAB — HEPATIC FUNCTION PANEL
ALT: 16 U/L (ref 0–53)
AST: 19 U/L (ref 0–37)
Albumin: 1.3 g/dL — ABNORMAL LOW (ref 3.5–5.2)
Alkaline Phosphatase: 84 U/L (ref 39–117)
Total Bilirubin: 0.3 mg/dL (ref 0.3–1.2)
Total Protein: 5 g/dL — ABNORMAL LOW (ref 6.0–8.3)

## 2013-09-13 LAB — CBC
HCT: 26.3 % — ABNORMAL LOW (ref 39.0–52.0)
HEMOGLOBIN: 9.2 g/dL — AB (ref 13.0–17.0)
MCH: 32.1 pg (ref 26.0–34.0)
MCHC: 35 g/dL (ref 30.0–36.0)
MCV: 91.6 fL (ref 78.0–100.0)
PLATELETS: 61 10*3/uL — AB (ref 150–400)
RBC: 2.87 MIL/uL — AB (ref 4.22–5.81)
RDW: 16.9 % — ABNORMAL HIGH (ref 11.5–15.5)
WBC: 6.2 10*3/uL (ref 4.0–10.5)

## 2013-09-13 LAB — TSH: TSH: 3.881 u[IU]/mL (ref 0.350–4.500)

## 2013-09-13 LAB — MAGNESIUM: Magnesium: 2.3 mg/dL (ref 1.5–2.5)

## 2013-09-13 LAB — HEMOGLOBIN AND HEMATOCRIT, BLOOD
HEMATOCRIT: 26.1 % — AB (ref 39.0–52.0)
Hemoglobin: 8.9 g/dL — ABNORMAL LOW (ref 13.0–17.0)

## 2013-09-13 MED ORDER — HEPARIN SODIUM (PORCINE) 1000 UNIT/ML DIALYSIS
1000.0000 [IU] | Freq: Once | INTRAMUSCULAR | Status: AC
  Administered 2013-09-13: 2400 [IU] via INTRAVENOUS_CENTRAL
  Filled 2013-09-13: qty 6

## 2013-09-13 MED ORDER — FENTANYL CITRATE 0.05 MG/ML IJ SOLN
12.5000 ug | INTRAMUSCULAR | Status: DC | PRN
  Administered 2013-09-13 – 2013-09-16 (×26): 12.5 ug via INTRAVENOUS
  Filled 2013-09-13 (×25): qty 2

## 2013-09-13 MED ORDER — HEPARIN SODIUM (PORCINE) 1000 UNIT/ML DIALYSIS
1000.0000 [IU] | Freq: Once | INTRAMUSCULAR | Status: DC
  Filled 2013-09-13 (×2): qty 6

## 2013-09-13 MED ORDER — SODIUM CHLORIDE 0.9 % IV SOLN
INTRAVENOUS | Status: DC
  Administered 2013-09-13 – 2013-09-15 (×4): via INTRAVENOUS
  Administered 2013-09-16: 1000 mL via INTRAVENOUS
  Administered 2013-09-16 – 2013-09-17 (×2): via INTRAVENOUS

## 2013-09-13 MED ORDER — PIPERACILLIN-TAZOBACTAM 3.375 G IVPB
3.3750 g | Freq: Three times a day (TID) | INTRAVENOUS | Status: AC
  Administered 2013-09-13 – 2013-09-14 (×5): 3.375 g via INTRAVENOUS
  Filled 2013-09-13 (×6): qty 50

## 2013-09-13 MED ORDER — BISACODYL 10 MG RE SUPP
10.0000 mg | Freq: Once | RECTAL | Status: DC
  Filled 2013-09-13: qty 1

## 2013-09-13 NOTE — Procedures (Signed)
Extubation Procedure Note  Patient Details:   Name: Edwin [name] DOB: 12-14-1955 MRN: [id number]   Airway Documentation:     Evaluation  O2 sats: stable throughout Complications: No apparent complications Patient did tolerate procedure well. Bilateral Breath Sounds: Clear Suctioning: Airway Yes  Order received for extubation.  Cuff leak positive prior to extubation.  Placed on 3l [place] after procedure.  Patient able to vocalize.  No complications noted.  No stridor noted.  Will continue to monitor.  Phillis Knack [facility] 09/13/2013, 10:51 AM

## 2013-09-13 NOTE — Progress Notes (Signed)
eLink Physician-Brief Progress Note Patient Name: Edwin [name] DOB: [birth year] MRN: [id number]  Date of Service  09/13/2013   HPI/Events of Note   Small bloody stool  HD stable No blood from NG  eICU Interventions  H/h now and repeat CBC in AM   Intervention Category Intermediate Interventions: Bleeding - evaluation and treatment with blood products  Kaicee Scarpino 09/13/2013, 6:48 PM

## 2013-09-13 NOTE — Clinical Social Work Note (Signed)
CSW called and left message for Jonelle Sidle, pt daughter.  CSW awaiting a return call.    Nonnie Done, [place] [phone]  Clinical Social Work

## 2013-09-13 NOTE — Progress Notes (Signed)
PULMONARY / CRITICAL CARE PROGRESS NOTE  Name: Edwin [name] MRN: [id number] DOB: 1956/04/11    ADMISSION DATE: 09/07/2013   PRIMARY SERVICE: PCCM  CHIEF COMPLAINT: Sepsis   BRIEF PATIENT DESCRIPTION: 58 yo with severe PAD admitted 2/12 with septic shock, acute renal failure, metabolic acidosis and hyperkalemia in setting of gangrenous non-healing RLE ulcer.   SIGNIFICANT EVENTS / STUDIES:  2/12  R tib/fib XR >>> No osteo 2/12  R ankle >>> No acute process 2/12  Endoscopy / flex sig >>> No active bleed, large gastric residual, multiple shallow gastric ulcers without hemorrhage. Suspected nonsteroidal gastropathy vs gastric mucosal ischemia. 2/13  R AKA, remained intubated after surgery 2/14 Now full code 2/16- remains vented, shock, concern SBO 2/16 ct abdo>>>Multiple dilated small bowel loops without definite transition  point. There appears of the a gradual transition to more normal  caliber terminal ileum. Minimal free fluid. Air-fluid throughout the  colon. Findings may be due to ileus versus early/partial distal  small bowel obstruction.   LINES / TUBES:  Foley cath 2/12 >>> RIJ  CVL 2/12 >>> ETT 2/13 >>> R rad a-line 2/13 >>> LIJ 2/16>>  CULTURES:  2/12  MRSA >>> neg 2/12  Stool >>> C. Diff negative, NGTD 2/12  Blood >>> NGTD 2/12  Urine >>> neg  ANTIBIOTICS:  Vancomycin 2/12 >>>2/17 Zosyn 2/12 >>> 2/18 planned Clindamycin 2/12 >>> 2/16  INTERVAL HISTORY:   Weaning well  VITAL SIGNS: Temp:  [96.8 F (36 C)-98.5 F (36.9 C)] 97.3 F (36.3 C) (02/17 0600) Pulse Rate:  [58-95] 79 (02/17 0356) Resp:  [16-24] 20 (02/17 0600) BP: (77-134)/(53-93) 103/66 mmHg (02/17 0600) SpO2:  [50 %-100 %] 100 % (02/17 0600) Arterial Line BP: (94-148)/(45-70) 120/55 mmHg (02/17 0600) FiO2 (%):  [40 %] 40 % (02/17 0600) Weight:  [119 lb 11.4 oz (54.3 kg)] 119 lb 11.4 oz (54.3 kg) (02/17 0500)  HEMODYNAMICS: CVP:  [4 mmHg] 4 mmHg VENTILATOR SETTINGS: Vent Mode:  [-]  PRVC FiO2 (%):  [40 %] 40 % Set Rate:  [20 bmp-24 bmp] 20 bmp Vt Set:  [550 mL] 550 mL PEEP:  [5 cmH20] 5 cmH20 Pressure Support:  [5 cmH20] 5 cmH20 Plateau Pressure:  [14 cmH20-20 cmH20] 15 cmH20  INTAKE / OUTPUT: Intake/Output     02/16 0701 - 02/17 0700   I.V. (mL/kg) 2319.9 (42.7)   IV Piggyback 250   Total Intake(mL/kg) 2569.9 (47.3)   Urine (mL/kg/hr) 259 (0.2)   Emesis/NG output 1175 (0.9)   Other 735 (0.6)   Total Output 2169   Net +400.9        BP 103/66  Pulse 79  Temp(Src) 97.3 F (36.3 C) (Core (Comment))  Resp 20  Ht 5\' 9"  (1.753 m)  Wt 119 lb 11.4 oz (54.3 kg)  BMI 17.67 kg/m2  SpO2 100%  Subjective: Pt doing well. Still off pressors. Large NG output, no BM. Reports tenderness with palpation of abdomen.   PHYSICAL EXAMINATION: General:  Mechanically ventilated Neuro:  Follows commands. Awake and Alert HEENT:  PERRL, OETT / OGT, poor dentition Cardiovascular: irregular, no m/r/g Lungs:  Bilateral very diminished air entry Abdomen:  Soft, tender, bowel sounds diminished, int tenderness deep Musculoskeletal:  No edema, R AKA Skin:  Sacral / gluteal decubiti   LABS:  CBC  Recent Labs Lab 09/10/13 0420 09/11/13 0412 09/13/13 0400  WBC 11.5* 11.1* 6.2  HGB 6.6* 9.5* 9.2*  HCT 19.1* 26.8* 26.3*  PLT 170 123* 61*   Coag's  Recent  Labs Lab 09/08/13 0020 09/09/13 0339 09/11/13 0412 09/12/13 0350 09/13/13 0400  APTT  --   --  36 37 36  INR 1.31 1.37  --   --   --    BMET  Recent Labs Lab 09/12/13 0350 09/12/13 1641 09/13/13 0400  NA 135* 135* 136*  K 4.0 3.9 3.5*  CL 100 99 100  CO2 25 24 25   BUN 20 17 13   CREATININE 1.08 1.14 0.98  GLUCOSE 116* 90 114*   Electrolytes  Recent Labs Lab 09/11/13 1700 09/12/13 0350 09/12/13 1641 09/13/13 0400  CALCIUM  --  7.9* 7.6* 7.5*  MG 2.3 2.3 2.1  --   PHOS  --  1.8* 1.9* 1.6*   Sepsis Markers  Recent Labs Lab 09/07/13 2149 09/08/13 0020  LATICACIDVEN 6.11* 5.8*  PROCALCITON   --  4.57   ABG  Recent Labs Lab 09/10/13 0405 09/12/13 0848 09/13/13 0345  PHART 7.365 7.456* 7.520*  PCO2ART 32.6* 18.5* 31.4*  PO2ART 92.4 130.0* 99.9   Liver Enzymes  Recent Labs Lab 09/07/13 2047  09/12/13 0350 09/12/13 1641 09/13/13 0400  AST 32  --   --   --  19  ALT 17  --   --   --  16  ALKPHOS 140*  --   --   --  84  BILITOT 0.6  --   --   --  0.3  ALBUMIN 2.5*  < > 1.6* 1.4* 1.4*  1.3*  < > = values in this interval not displayed. Cardiac Enzymes  Recent Labs Lab 09/08/13 0020 09/08/13 0225 09/08/13 1116  TROPONINI <0.30 <0.30 <0.30   Glucose  Recent Labs Lab 09/12/13 0757 09/12/13 1122 09/12/13 1642 09/12/13 1845 09/12/13 2335 09/13/13 0400  GLUCAP 116* 119* 89 75 102* 114*   CXR: Stable, no acute process, tube in good position.   ASSESSMENT / PLAN:  PULMONARY  A:  Acute respiratory failure COPD  Suspect very poor baseline pulmonary function P:  Daily SBT, wean cpap5 ps 5, goal 1 hr, assess rsbi, abg CT abd: Subtle reticulonodular pattern of opacification over the posterior  lower lobes likely due to an infectious versus inflammatory process, aspiration? Not impressive Albuterol PRN pcxr in am, kep NGT to avoid aspiration if extubation planned Will need IS  CARDIOVASCULAR  A:  Septic shock  AF/RVR S/p R AKA Hx HTN, PVD Hypovolemia component 2/16 P:  Vascular following Goal MAP > 60 Amiodarone gtt, not known to be in fib, was noted during septic shock, remain sin SR Allow pos balance No anticoagulation with bleeding prior Echo assessment, ensure tsh done  RENAL  A:  AKI resolving Mild hypokalemia (3.5)  P:  Renal following to dc cvvhd Allow pos balance Chem in am   GASTROINTESTINAL  A:  Gastropathy Gastroparesis / high residuals Suspected GI hemorrhage, but no evidence on EGD / FS Nutrition GI Px SBO partial? P:  GI following TF held d/t large residual SBO- gastric residual 1175 NG output yesterday Protonix  BID Reglan 5 q8h, dc D10 50 mL/h for hypoglycemia Keep NGT suction even after extubation CT abd: Multiple dilated small bowel loops without definite transition point. There appears of the a gradual transition to more normal caliber terminal ileum. Minimal free fluid. Air-fluid throughout the colon. Findings may be due to ileus versus early/partial distal small bowel obstruction.  Follow LFT NL May need tpn given concern malnourished state dulc supp x 1  HEMATOLOGIC  A:  Anemia VTE Px thrombocytopenia  P:  Trend CBC, hb stable Platelets low at 61, from ~240 5 days ago. ? Sepsis vs HIT, moderate suspicion but had recent GI bleed concerns, hold empiric DTI  SCD Aranesp dc until HITT back and concerns for dvt risk reduced  INFECTIOUS  A:  RLE gangrene, r/o SBO P:  Abx / cx as above Keep zosyn x 1 more day WBC trending down Dc vanc  ENDOCRINE  A:  Normoglycemic P:  D10 @ 50 mL/hr Cortisol, 76 steroids not indicated CBG: 75-114  NEUROLOGIC  A:  Acute encephalopathy Hx alcohol abuse Anxiety P:  Goal RASS 0 to -1 Fentanyl PRN Thiamine  Kuneff, Renee DO PGY-2 09/13/2013, 6:47 AM   Ccm tmie 30 min   I have fully examined this patient and agree with above findings.    And edited in full  [name]. Titus Mould, MD, [place] Pgr: [facility]

## 2013-09-13 NOTE — Progress Notes (Signed)
Patient ID: Edwin [name], male   DOB: 1955/08/26, 58 y.o.   MRN: [id number] S:pt awake/alert and cooperative, intubated but comfortable, no c/o O:BP 113/72  Pulse 41  Temp(Src) 97.5 F (36.4 C) (Core (Comment))  Resp 19  Ht 5\' 9"  (1.753 m)  Wt 54.3 kg (119 lb 11.4 oz)  BMI 17.67 kg/m2  SpO2 87%  Intake/Output Summary (Last 24 hours) at 09/13/13 0910 Last data filed at 09/13/13 0800  Gross per 24 hour  Intake 2567.35 ml  Output   2119 ml  Net 448.35 ml   Intake/Output: I/O last 3 completed shifts: In: 3786 [I.V.:3336; IV Piggyback:450] Out: [id number] [Urine:387; Emesis/NG output:2925; Other:886]  Intake/Output this shift:  Total I/O In: 76.7 [I.V.:76.7] Out: 2 [Other:2] Weight change: 1 kg (2 lb 3.3 oz) [id number] WM intubated but awake/alert CVS:no rub Resp:occ rhonchi Abd:+BS, soft, ND Ext:s/p R AKA   Recent Labs Lab 09/07/13 2047  09/08/13 0500  09/09/13 0339  09/10/13 0420 09/10/13 1600 09/11/13 0412 09/11/13 1600 09/12/13 0350 09/12/13 1641 09/13/13 0400  NA 124*  < > 139  < > 136*  < > 140 138 142 140 135* 135* 136*  K >7.7*  < > 5.0  < > 5.3  < > 4.5 4.0 3.6* 4.2 4.0 3.9 3.5*  CL 81*  < > 96  < > 97  < > 102 102 104 103 100 99 100  CO2 14*  < > 24  < > 21  < > 18* 19 23 23 25 24 25   GLUCOSE 92  < > 111*  < > 126*  < > 145* 104* 107* 131* 116* 90 114*  BUN 106*  < > 80*  < > 82*  < > 84* 70* 41* 29* 20 17 13   CREATININE 5.75*  < > 3.97*  < > 3.87*  < > 3.94* 3.00* 1.87* 1.38* 1.08 1.14 0.98  ALBUMIN 2.5*  --  2.0*  --  1.6*  --   --  1.6*  --  1.7* 1.6* 1.4* 1.4*  1.3*  CALCIUM 9.6  < > 7.6*  < > 7.3*  < > 7.2* 7.4* 7.8* 8.0* 7.9* 7.6* 7.5*  PHOS  --   < > 6.3*  --  7.8*  < > 7.6* 5.7* 3.2 2.6 1.8* 1.9* 1.6*  AST 32  --   --   --   --   --   --   --   --   --   --   --  19  ALT 17  --   --   --   --   --   --   --   --   --   --   --  16  < > = values in this interval not displayed. Liver Function Tests:  Recent Labs Lab 09/07/13 2047  09/12/13 0350  09/12/13 1641 09/13/13 0400  AST 32  --   --   --  19  ALT 17  --   --   --  16  ALKPHOS 140*  --   --   --  84  BILITOT 0.6  --   --   --  0.3  PROT 7.4  --   --   --  5.0*  ALBUMIN 2.5*  < > 1.6* 1.4* 1.4*  1.3*  < > = values in this interval not displayed.  Recent Labs Lab 09/07/13 2047  LIPASE 127*   No results found for this  basename: AMMONIA,  in the last 168 hours CBC:  Recent Labs Lab 09/07/13 2047  09/09/13 0339  09/09/13 1730 09/10/13 0420 09/11/13 0412 09/13/13 0400  WBC 33.5*  < > 17.6*  --  13.9* 11.5* 11.1* 6.2  NEUTROABS 30.8*  --   --   --   --   --   --   --   HGB 8.0*  < > 9.2*  < > 7.2* 6.6* 9.5* 9.2*  HCT 23.3*  < > 26.4*  < > 20.8* 19.1* 26.8* 26.3*  MCV 92.8  < > 93.3  --  93.7 93.2 89.0 91.6  PLT 529*  < > 264  --  211 170 123* 61*  < > = values in this interval not displayed. Cardiac Enzymes:  Recent Labs Lab 09/08/13 0020 09/08/13 0225 09/08/13 1116  CKTOTAL 47  --   --   TROPONINI <0.30 <0.30 <0.30   CBG:  Recent Labs Lab 09/12/13 1642 09/12/13 1845 09/12/13 2335 09/13/13 0400 09/13/13 0829  GLUCAP 89 75 102* 114* 110*    Iron Studies: No results found for this basename: IRON, TIBC, TRANSFERRIN, FERRITIN,  in the last 72 hours Studies/Results: Ct Abdomen Pelvis Wo Contrast  09/12/2013   CLINICAL DATA:  Rule out obstruction.  Abdominal distention.  EXAM: CT ABDOMEN AND PELVIS WITHOUT CONTRAST  TECHNIQUE: Multidetector CT imaging of the abdomen and pelvis was performed following the standard protocol without intravenous contrast.  COMPARISON:  Plain films 09/12/2013 and 09/11/2013  FINDINGS: Lung bases demonstrate subtle reticulonodular pattern of opacification over the posterior lower lobes. Mild posterior bibasilar atelectatic change. Minimal contrast is present within the distal esophagus.  Abdominal images demonstrate the liver, spleen, pancreas, gallbladder, adrenal glands and kidneys to the within normal. There are  calcifications over the kidneys bilaterally likely vascular. There is moderate calcified plaque involving the abdominal aorta and iliac vessels. Infrarenal abdominal aorta measures 2.9 cm AP diameter.  There are multiple old dilated small bowel loops with minimal wall thickening of several mid small bowel loops. No definite transition point is seen. There appears to be a somewhat gradual transition to mildly prominent but nondilated terminal ileum. Fluid and air are present throughout the colon. No free peritoneal air. Appendix is not definitely seen although there appears to be an appendicolith at the origin. Minimal free fluid is present.  Pelvic images demonstrate a Foley catheter within a collapsed bladder. There are moderate degenerative changes of the spine. There is a mild grade 1 anterolisthesis of L4 on L5 due to an old L4 spondylolysis. There are degenerative changes of the hips.  IMPRESSION: Multiple dilated small bowel loops without definite transition point. There appears of the a gradual transition to more normal caliber terminal ileum. Minimal free fluid. Air-fluid throughout the colon. Findings may be due to ileus versus early/partial distal small bowel obstruction.  Subtle reticulonodular pattern of opacification over the posterior lower lobes likely due to an infectious versus inflammatory process. Recommend followup CT in 6-8 weeks after treatment.  Mild dilatation of the infrarenal abdominal aorta measuring 2.9 cm in AP diameter. Recommend followup ultrasound in 5 years. This recommendation follows ACR consensus guidelines: White Paper of the ACR Incidental Findings Committee II on Vascular Findings. J Am Coll Radiol [phone].  Degenerate changes the spine with grade 1 anterolisthesis of L4 on L5 due to chronic L4 spondylolysis.   Electronically Signed   By: Marin Olp M.D.   On: 09/12/2013 16:22   Dg Chest [facility] 1 [address]  09/13/2013   CLINICAL DATA:  Shortness of breath, ventilator.   EXAM: PORTABLE CHEST - 1 VIEW  COMPARISON:  09/12/2013  FINDINGS: Support devices are in stable position. Heart is normal size. Lungs are clear. No effusions or acute bony abnormality.  IMPRESSION: No acute cardiopulmonary disease.  Support devices stable.   Electronically Signed   By: Rolm Baptise M.D.   On: 09/13/2013 06:55   Dg Chest Port 1 View  09/12/2013   CLINICAL DATA:  Central line placement  EXAM: PORTABLE CHEST - 1 VIEW  COMPARISON:  Portable chest x-ray of 09/12/2013  FINDINGS: The tip of the endotracheal tube appears to be approximately 8.3 cm above the carina. A right central venous line has retracted slightly with the tip overlying the upper SVC. A left central venous line tip overlies the mid SVC. No pneumothorax is seen. The lungs are clear. Heart size is stable. An NG tube extends below the hemidiaphragm.  IMPRESSION: 1. New left central venous line tip overlies the mid SVC. No pneumothorax. 2. Tip of endotracheal tube approximately 8.3 cm above the carina   Electronically Signed   By: Ivar Drape M.D.   On: 09/12/2013 16:00   Dg Chest Port 1 View  09/12/2013   CLINICAL DATA:  Intubation, history COPD, hypertension, coronary artery disease, CHF, asthma, smoking  EXAM: PORTABLE CHEST - 1 VIEW  COMPARISON:  Portable exam 0757 hr compared to 09/10/2013  FINDINGS: Tip of endotracheal tube projects 6.3 cm above carinal.  Nasogastric tube extends into stomach.  Right jugular central venous catheter tip projects over proximal SVC.  Normal heart size, mediastinal contours, and pulmonary vascularity.  Atherosclerotic calcifications aorta.  Emphysematous changes without infiltrate, pleural effusion, or pneumothorax.  Right glenohumeral degenerative changes.  IMPRESSION: No acute abnormalities.   Electronically Signed   By: Lavonia Dana M.D.   On: 09/12/2013 08:07   Dg Abd Portable 1v  09/12/2013   CLINICAL DATA:  Abdominal distension, evaluate small bowel obstruction an NG tube positioning  EXAM:  PORTABLE ABDOMEN - 1 VIEW  COMPARISON:  DG ABD PORTABLE 1V dated 09/11/2013  FINDINGS: Interval resolution of previously noted marked gas distention of the stomach. Enteric tube tip and side port overlie the expected location of the gastric fundus. There is persistent marked gaseous distention of multiple loops of small bowel within next loop in the left mid hemiabdomen measuring approximately 4.2 cm in diameter. These findings are associated with a conspicuous paucity of distal colonic gas.  No supine evidence of pneumoperitoneum. No pneumatosis or portal venous gas.  Limited visualization the lower thorax is unremarkable.  Multi-level thoracolumbar spine degenerative changes suspected. Vascular calcifications overlie the lower pelvis. Presumed bone island overlies the right ilium.  IMPRESSION: 1. Grossly unchanged findings worrisome for small bowel obstruction. 2. Decreased gaseous distention of the stomach. Enteric tube tip and side port remain over the gastric fundus.   Electronically Signed   By: Sandi Mariscal M.D.   On: 09/12/2013 07:38   Dg Abd Portable 1v  09/11/2013   CLINICAL DATA:  Abdominal distention.  EXAM: PORTABLE ABDOMEN - 1 VIEW  COMPARISON:  None.  FINDINGS: NG tube is in place with the side-port in the stomach. The stomach is markedly distended and there is distention of small bowel loops up to 5.4 cm. Little to no gas in the colon is seen.  IMPRESSION: Bowel gas pattern worrisome for small bowel obstruction with dilatation of small bowel and marked gaseous distention of the stomach.  Electronically Signed   By: Inge Rise M.D.   On: 09/11/2013 19:10   . antiseptic oral rinse  15 mL Mouth Rinse QID  . chlorhexidine  15 mL Mouth Rinse BID  . collagenase   Topical Daily  . darbepoetin (ARANESP) injection - DIALYSIS  100 mcg Intravenous Q Sat-HD  . insulin aspart  0-9 Units Subcutaneous 6 times per day  . metoCLOPramide (REGLAN) injection  5 mg Intravenous 3 times per day  .  pantoprazole (PROTONIX) IV  40 mg Intravenous Q12H  . piperacillin-tazobactam (ZOSYN)  IV  2.25 g Intravenous 4 times per day  . thiamine  100 mg Intravenous Daily  . vancomycin  500 mg Intravenous Q24H    BMET    Component Value Date/Time   NA 136* 09/13/2013 0400   K 3.5* 09/13/2013 0400   CL 100 09/13/2013 0400   CO2 25 09/13/2013 0400   GLUCOSE 114* 09/13/2013 0400   BUN 13 09/13/2013 0400   CREATININE 0.98 09/13/2013 0400   CALCIUM 7.5* 09/13/2013 0400   GFRNONAA >90 09/13/2013 0400   GFRAA >90 09/13/2013 0400   CBC    Component Value Date/Time   WBC 6.2 09/13/2013 0400   RBC 2.87* 09/13/2013 0400   HGB 9.2* 09/13/2013 0400   HCT 26.3* 09/13/2013 0400   PLT 61* 09/13/2013 0400   MCV 91.6 09/13/2013 0400   MCH 32.1 09/13/2013 0400   MCHC 35.0 09/13/2013 0400   RDW 16.9* 09/13/2013 0400   LYMPHSABS 0.7 09/07/2013 2047   MONOABS 2.0* 09/07/2013 2047   EOSABS 0.0 09/07/2013 2047   BASOSABS 0.0 09/07/2013 2047     Assessment/Plan:  1. Oliguric AKI in the setting of SIRS, NSAIDs and ACE and has required CRRT due to hyperkalemia and then continued this weekend due to increased need for pressor and minimal UOP.  1. Off of pressors but still remains oliguric and not able to keep + over the last 24 hours.   2. Will follow for IHD if his UOP does not improve but feel he is intravascularly volume depleted and would try to keep him positive on the fluid balance in order to improve renal function 2. VDRF- per PCCM 3. PVD/gangrene s/p RAKA- on clinda/zosyn/vanc, follow vanc trough levels 4. ABLA- cont to follow, on aranesp 5. HTN/Volume- low CVP, no overt volume overload, will get him off of CVVHD  6. Protein malnutrition- unable to advance tube feeds. 7. Dispo- per PCCM 8.  Mart A

## 2013-09-13 NOTE — Progress Notes (Signed)
200 ml of fentanyl gtt wasted into sink, witnessed by Ricki Rodriguez RN.   Richardean Canal RN, BSN, CCRN

## 2013-09-13 NOTE — Progress Notes (Signed)
ANTIBIOTIC CONSULT NOTE - FOLLOW UP  Pharmacy Consult: Zosyn Indication:  Rule out sepsis  Allergies  Allergen Reactions  . Codeine Nausea Only    Patient Measurements: Height: 5\' 9"  (175.3 cm) Weight: 119 lb 11.4 oz (54.3 kg) IBW/kg (Calculated) : 70.7  Vital Signs: Temp: 98.6 F (37 C) (02/17 1200) Temp src: Core (Comment) (02/17 0600) BP: 101/66 mmHg (02/17 1200) Pulse Rate: 97 (02/17 1200) Intake/Output from previous day: 02/16 0701 - 02/17 0700 In: 2646.6 [I.V.:2396.6; IV Piggyback:250] Out: 2229 [Urine:269; Emesis/NG output:1225]  Labs:  Recent Labs  09/11/13 0412  09/12/13 0350 09/12/13 1641 09/13/13 0400  WBC 11.1*  --   --   --  6.2  HGB 9.5*  --   --   --  9.2*  PLT 123*  --   --   --  61*  CREATININE 1.87*  < > 1.08 1.14 0.98  < > = values in this interval not displayed. Estimated Creatinine Clearance: 64.6 ml/min (by C-G formula based on Cr of 0.98).  Recent Labs  09/12/13 1100  VANCOTROUGH 7.8*     Assessment: 56 YOM with history of gangrenous non-healing RLE ulcer admitted with sepsis now only on Zosyn. Stopping CRRT today. Planning for 7 days of treatment of antibiotics total. WBC normalized, afebrile. No growth on cultures.  Goal of Therapy:  Eradication of infection  Plan:  1. Zosyn 3.375gm IV q8h EI with stop date of 2/18   Edwin [name] D. Edwin [name], PharmD, BCPS Clinical Pharmacist Pager: [phone] 09/13/2013 12:09 PM

## 2013-09-13 NOTE — Progress Notes (Signed)
Chaplain received referral from patient's RN and offered emotional and spiritual support to patient. Patient said his leg is hurting. He said he lost his job and is not working, living in a hotel. He said he doesn't know what he wants to do when he leaves hospital, but hopes to get on disability and social security. Patient said his hope is in reconnecting with his daughters and grandchildren. Chaplain listened empathically and provided emotional and spiritual support and caring presence. Will follow up. Please page if needed.   [facility]: [phone]

## 2013-09-13 NOTE — Progress Notes (Signed)
°  Echocardiogram 2D Echocardiogram has been performed.  Mivaan Corbitt FRANCES 09/13/2013, 12:43 PM

## 2013-09-13 NOTE — Progress Notes (Signed)
[facility]  Subjective  - doing well still intubated.   Objective 120/56 84 97.8 F (36.6 C) (Core (Comment)) 16 100%  Intake/Output Summary (Last 24 hours) at 09/13/13 0744 Last data filed at 09/13/13 0600  Gross per 24 hour  Intake 2569.85 ml  Output   2169 ml  Net 400.85 ml    Right stump clean and dry, no erythema, no active drainage.  Assessment/Planning: POD #4  Right AKA Will order stockinet from ortho tech to don and doff dressing easily daily.    Laurence Slate [facility] 09/13/2013 7:44 AM --  Laboratory Lab Results:  Recent Labs  09/11/13 0412 09/13/13 0400  WBC 11.1* 6.2  HGB 9.5* 9.2*  HCT 26.8* 26.3*  PLT 123* 61*   BMET  Recent Labs  09/12/13 1641 09/13/13 0400  NA 135* 136*  K 3.9 3.5*  CL 99 100  CO2 24 25  GLUCOSE 90 114*  BUN 17 13  CREATININE 1.14 0.98  CALCIUM 7.6* 7.5*    COAG Lab Results  Component Value Date   INR 1.37 09/09/2013   INR 1.31 09/08/2013   INR 0.94 08/16/2013   No results found for this basename: PTT

## 2013-09-13 NOTE — Progress Notes (Signed)
Physical Therapy Wound Treatment Patient Details  Name: Edwin [name] MRN: [id number] Date of Birth: 1955/09/10  Today's Date: 09/13/2013 Time: 3329-5188 Time Calculation (min): 62 min  Subjective  Subjective: Pt nodding and shaking head appropriately. Patient and Family Stated Goals: Pt unable to state Date of Onset:  (Prior to hospitalization.) Prior Treatments: Dressing changes  Pain Score:    Wound Assessment  Pressure Ulcer 09/08/13 Unstageable - Full thickness tissue loss in which the base of the ulcer is covered by slough (yellow, tan, gray, green or brown) and/or eschar (tan, brown or black) in the wound bed. unstageable wound under left buttock and thigh (Active)  Dressing Type Foam;Barrier Film (skin prep);Gauze (Comment) 09/13/2013  9:45 AM  Dressing Changed 09/13/2013  9:45 AM  State of Healing Eschar 09/13/2013  9:45 AM  Site / Wound Assessment Black;Brown 09/13/2013  9:45 AM  % Wound base Red or Granulating 0% 09/13/2013  9:45 AM  % Wound base Yellow 0% 09/13/2013  9:45 AM  % Wound base Black 100% 09/13/2013  9:45 AM  % Wound base Other (Comment) 0% 09/13/2013  9:45 AM  Peri-wound Assessment Intact 09/13/2013  9:45 AM  Wound Length (cm) 9 cm 09/13/2013  9:45 AM  Wound Width (cm) 6 cm 09/13/2013  9:45 AM  Wound Depth (cm) 0 cm 09/13/2013  9:45 AM  Drainage Amount Scant 09/13/2013  9:45 AM  Drainage Description Serous;No odor 09/13/2013  9:45 AM  Treatment Debridement (Selective);Hydrotherapy (Pulse lavage) 09/13/2013  9:45 AM   Hydrotherapy Pulsed lavage therapy - wound location: Lt ischium Pulsed Lavage with Suction (psi): 8 psi Pulsed Lavage with Suction - Normal Saline Used: 1000 mL Pulsed Lavage Tip: Tip with splash shield Selective Debridement Selective Debridement - Location: lt ischium Selective Debridement - Tools Used: Forceps;Scissors Selective Debridement - Tissue Removed: black eschar   Wound Assessment and Plan  Wound Therapy -  Assess/Plan/Recommendations Wound Therapy - Clinical Statement: Pt presents to hydrotherapy with unstageable wound to lt ischium. Can benefit from hydrotherapy to remove eschar and promote wound healing. Wound Therapy - Functional Problem List: Decr activity and sitting  Factors Delaying/Impairing Wound Healing: Immobility;Multiple medical problems;Polypharmacy;Vascular compromise Hydrotherapy Plan: Debridement;Dressing change;Patient/family education;Pulsatile lavage with suction Wound Therapy - Frequency: 6X / week Wound Therapy - Follow Up Recommendations: Skilled nursing facility Wound Plan: See above  Wound Therapy Goals- Improve the function of patient's integumentary system by progressing the wound(s) through the phases of wound healing (inflammation - proliferation - remodeling) by: Decrease Necrotic Tissue to: 25 Decrease Necrotic Tissue - Progress: Goal set today Increase Granulation Tissue to: 75 Increase Granulation Tissue - Progress: Goal set today Goals/treatment plan/discharge plan were made with and agreed upon by patient/family: Yes Time For Goal Achievement: 7 days Wound Therapy - Potential for Goals: Good  Goals will be updated until maximal potential achieved or discharge criteria met.  Discharge criteria: when goals achieved, discharge from hospital, MD decision/surgical intervention, no progress towards goals, refusal/missing three consecutive treatments without notification or medical reason.  GP     Keonta Alsip 09/13/2013, 12:01 PM  [place]

## 2013-09-14 LAB — COMPREHENSIVE METABOLIC PANEL
ALBUMIN: 1.4 g/dL — AB (ref 3.5–5.2)
ALT: 14 U/L (ref 0–53)
AST: 19 U/L (ref 0–37)
Alkaline Phosphatase: 116 U/L (ref 39–117)
BUN: 19 mg/dL (ref 6–23)
CHLORIDE: 102 meq/L (ref 96–112)
CO2: 25 mEq/L (ref 19–32)
Calcium: 7.7 mg/dL — ABNORMAL LOW (ref 8.4–10.5)
Creatinine, Ser: 1.59 mg/dL — ABNORMAL HIGH (ref 0.50–1.35)
GFR calc Af Amer: 54 mL/min — ABNORMAL LOW (ref >90)
GFR calc non Af Amer: 47 mL/min — ABNORMAL LOW (ref >90)
Glucose, Bld: 104 mg/dL — ABNORMAL HIGH (ref 70–99)
Potassium: 3 mEq/L — ABNORMAL LOW (ref 3.7–5.3)
Sodium: 139 mEq/L (ref 137–147)
Total Bilirubin: 0.3 mg/dL (ref 0.3–1.2)
Total Protein: 5 g/dL — ABNORMAL LOW (ref 6.0–8.3)

## 2013-09-14 LAB — CULTURE, BLOOD (ROUTINE X 2)
Culture: NO GROWTH
Culture: NO GROWTH

## 2013-09-14 LAB — CBC WITH DIFFERENTIAL/PLATELET
BASOS PCT: 0 % (ref 0–1)
Basophils Absolute: 0 10*3/uL (ref 0.0–0.1)
EOS ABS: 0 10*3/uL (ref 0.0–0.7)
Eosinophils Relative: 1 % (ref 0–5)
HEMATOCRIT: 26.2 % — AB (ref 39.0–52.0)
HEMOGLOBIN: 8.9 g/dL — AB (ref 13.0–17.0)
Lymphocytes Relative: 13 % (ref 12–46)
Lymphs Abs: 0.6 10*3/uL — ABNORMAL LOW (ref 0.7–4.0)
MCH: 31.4 pg (ref 26.0–34.0)
MCHC: 34 g/dL (ref 30.0–36.0)
MCV: 92.6 fL (ref 78.0–100.0)
MONO ABS: 0.3 10*3/uL (ref 0.1–1.0)
Monocytes Relative: 6 % (ref 3–12)
NEUTROS ABS: 3.7 10*3/uL (ref 1.7–7.7)
Neutrophils Relative %: 80 % — ABNORMAL HIGH (ref 43–77)
Platelets: 75 10*3/uL — ABNORMAL LOW (ref 150–400)
RBC: 2.83 MIL/uL — ABNORMAL LOW (ref 4.22–5.81)
RDW: 16.6 % — ABNORMAL HIGH (ref 11.5–15.5)
WBC: 4.7 10*3/uL (ref 4.0–10.5)

## 2013-09-14 LAB — GLUCOSE, CAPILLARY
GLUCOSE-CAPILLARY: 108 mg/dL — AB (ref 70–99)
GLUCOSE-CAPILLARY: 89 mg/dL (ref 70–99)
Glucose-Capillary: 107 mg/dL — ABNORMAL HIGH (ref 70–99)
Glucose-Capillary: 102 mg/dL — ABNORMAL HIGH (ref 70–99)
Glucose-Capillary: 141 mg/dL — ABNORMAL HIGH (ref 70–99)
Glucose-Capillary: 42 mg/dL — CL (ref 70–99)
Glucose-Capillary: 79 mg/dL (ref 70–99)
Glucose-Capillary: 81 mg/dL (ref 70–99)

## 2013-09-14 LAB — PHOSPHORUS: Phosphorus: 3.3 mg/dL (ref 2.3–4.6)

## 2013-09-14 LAB — MAGNESIUM: MAGNESIUM: 2.2 mg/dL (ref 1.5–2.5)

## 2013-09-14 MED ORDER — SUCRALFATE 1 GM/10ML PO SUSP
1.0000 g | Freq: Three times a day (TID) | ORAL | Status: DC
  Administered 2013-09-14 – 2013-09-21 (×28): 1 g via ORAL
  Filled 2013-09-14 (×33): qty 10

## 2013-09-14 MED ORDER — POTASSIUM CHLORIDE 10 MEQ/50ML IV SOLN
10.0000 meq | INTRAVENOUS | Status: AC
  Administered 2013-09-14 (×4): 10 meq via INTRAVENOUS
  Filled 2013-09-14 (×4): qty 50
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
## 2013-09-14 NOTE — Progress Notes (Signed)
Pt came from ICU this afternoon. Pt is alert and oriented. Pt has family visiting. Pt in no distress at this time.
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
## 2013-09-14 NOTE — Progress Notes (Signed)
[facility] Progress Note  09/14/2013 8:35 AM 5 Days Post-Op  Subjective:  Patient reports moderate pain with stump that is well controlled with pain meds. Has some phantom sensation. Able to mobilize stump.   Tm 99 BP sys 100s-120s Pulse 60s-90s O2 99% Nasal cannula  Filed Vitals:   09/14/13 0700  BP: 121/74  Pulse: 81  Temp: 97.4 F (36.3 C)  Resp: 28    Physical Exam:  Extremities:  Right stump dressed and with stockinette. No drainage. Actively mobilizing stump.   CBC    Component Value Date/Time   WBC 4.7 09/14/2013 0455   RBC 2.83* 09/14/2013 0455   HGB 8.9* 09/14/2013 0455   HCT 26.2* 09/14/2013 0455   PLT 75* 09/14/2013 0455   MCV 92.6 09/14/2013 0455   MCH 31.4 09/14/2013 0455   MCHC 34.0 09/14/2013 0455   RDW 16.6* 09/14/2013 0455   LYMPHSABS 0.6* 09/14/2013 0455   MONOABS 0.3 09/14/2013 0455   EOSABS 0.0 09/14/2013 0455   BASOSABS 0.0 09/14/2013 0455    BMET    Component Value Date/Time   NA 139 09/14/2013 0455   K 3.0* 09/14/2013 0455   CL 102 09/14/2013 0455   CO2 25 09/14/2013 0455   GLUCOSE 104* 09/14/2013 0455   BUN 19 09/14/2013 0455   CREATININE 1.59* 09/14/2013 0455   CALCIUM 7.7* 09/14/2013 0455   GFRNONAA 47* 09/14/2013 0455   GFRAA 54* 09/14/2013 0455    INR    Component Value Date/Time   INR 1.37 09/09/2013 0339     Intake/Output Summary (Last 24 hours) at 09/14/13 0835 Last data filed at 09/14/13 0700  Gross per 24 hour  Intake 2705.9 ml  Output   2219 ml  Net  486.9 ml     Assessment:  58 y.o. male is s/p: right AKA 5 Days Post-Op  Plan: -Stump with stockinette, mobilizing well, continue pain control.     Virgina Jock, Student PA [facility] [phone] 09/14/2013 8:35 AM

## 2013-09-14 NOTE — Progress Notes (Signed)
Physical Therapy Wound Treatment Patient Details  Name: BUEL MOLDER MRN: [id number] Date of Birth: 1956-03-22  Today's Date: 09/14/2013 Time: 2951-8841 Time Calculation (min): 35 min  Subjective  Subjective: Pt talking and answering questions appropriately.  Pain Score:    Wound Assessment  Pressure Ulcer 09/08/13 Unstageable - Full thickness tissue loss in which the base of the ulcer is covered by slough (yellow, tan, gray, green or brown) and/or eschar (tan, brown or black) in the wound bed. unstageable wound under left buttock and thigh (Active)  Dressing Type Foam;santyl;Gauze (Comment);Moist to dry 09/14/2013  9:07 AM  Dressing Clean;Dry;Intact 09/14/2013  9:07 AM  State of Healing Eschar 09/14/2013  9:07 AM  Site / Wound Assessment Black;Brown;Pink;Yellow 09/14/2013  9:07 AM  % Wound base Red or Granulating 10% 09/14/2013  9:07 AM  % Wound base Yellow 50% 09/14/2013  9:07 AM  % Wound base Black 40% 09/14/2013  9:07 AM  % Wound base Other (Comment) 0% 09/14/2013  9:07 AM  Peri-wound Assessment Intact 09/14/2013  9:07 AM  Wound Length (cm) 9 cm 09/13/2013  9:45 AM  Wound Width (cm) 6 cm 09/13/2013  9:45 AM  Wound Depth (cm) 0 cm 09/13/2013  9:45 AM  Drainage Amount Scant 09/14/2013  9:07 AM  Drainage Description Serous 09/14/2013  9:07 AM  Treatment Debridement (Selective);Hydrotherapy (Pulse lavage) 09/14/2013  9:07 AM  Drainage Amount None 09/13/2013  8:00 PM     Drainage Description Odor 09/13/2013  8:00 PM      Hydrotherapy Pulsed lavage therapy - wound location: Lt ischium Pulsed Lavage with Suction (psi): 8 psi Pulsed Lavage with Suction - Normal Saline Used: 1000 mL Pulsed Lavage Tip: Tip with splash shield Selective Debridement Selective Debridement - Location: lt ischium Selective Debridement - Tools Used: Forceps;Scissors Selective Debridement - Tissue Removed: black and yellow necrotic tissue   Wound Assessment and Plan  Wound Therapy -  Assess/Plan/Recommendations Wound Therapy - Clinical Statement: Wound improving and eschar being removed. Hydrotherapy Plan: Debridement;Dressing change;Patient/family education;Pulsatile lavage with suction Wound Therapy - Frequency: 6X / week Wound Therapy - Current Recommendations: PT Wound Therapy - Follow Up Recommendations: Other (comment) (Possibly CIR) Wound Plan: See above  Wound Therapy Goals- Improve the function of patient's integumentary system by progressing the wound(s) through the phases of wound healing (inflammation - proliferation - remodeling) by: Decrease Necrotic Tissue to: 25 Decrease Necrotic Tissue - Progress: Progressing toward goal Increase Granulation Tissue to: 75 Increase Granulation Tissue - Progress: Progressing toward goal  Goals will be updated until maximal potential achieved or discharge criteria met.  Discharge criteria: when goals achieved, discharge from hospital, MD decision/surgical intervention, no progress towards goals, refusal/missing three consecutive treatments without notification or medical reason.  GP     Elizardo Chilson 09/14/2013, 9:17 AM  [place]

## 2013-09-14 NOTE — Progress Notes (Signed)
PULMONARY / CRITICAL CARE PROGRESS NOTE  Name: Edwin [name] MRN: [id number] DOB: 26-Oct-1955    ADMISSION DATE: 09/07/2013   PRIMARY SERVICE: PCCM  CHIEF COMPLAINT: Sepsis   BRIEF PATIENT DESCRIPTION: 58 yo with severe PAD admitted 2/12 with septic shock, acute renal failure, metabolic acidosis and hyperkalemia in setting of gangrenous non-healing RLE ulcer.   SIGNIFICANT EVENTS / STUDIES:  2/12  R tib/fib XR >>> No osteo 2/12  R ankle >>> No acute process 2/12  Endoscopy / flex sig >>> No active bleed, large gastric residual, multiple shallow gastric ulcers without hemorrhage. Suspected nonsteroidal gastropathy vs gastric mucosal ischemia. 2/12 C.diff neg, stool culture neg 2/13  R AKA, remained intubated after surgery 2/14 Now full code 2/16- remains vented, shock, concern SBO 2/16 ct abdo>>>Multiple dilated small bowel loops without definite transition point. There appears of the a gradual transition to more normal  caliber terminal ileum. Minimal free fluid. Air-fluid throughout the  colon. Findings may be due to ileus versus early/partial distal small bowel obstruction .  2/17 Echo: EF 55% to 60%. Diastolic 1 dysfunction  LINES / TUBES:  Foley cath 2/12 >>> RIJ  CVL 2/12 >>> ETT 2/13 >>>2/17 R rad a-line 2/13 >>>2/18 LIJ 2/16>>  CULTURES:  2/12  MRSA >>> neg 2/12  Stool >>> C. Diff negative, NGTD 2/12  Blood >>> NGTD 2/12  Urine >>> neg  ANTIBIOTICS:  Vancomycin 2/12 >>>2/17 Zosyn 2/12 >>> 2/18 planned Clindamycin 2/12 >>> 2/16  INTERVAL HISTORY:   Extubated, doing well. Complains of leg pain overnight. He is extremely hungry. He had 4 BM overnight, one contained a small amount of blood.   VITAL SIGNS: Temp:  [97 F (36.1 C)-99 F (37.2 C)] 98.1 F (36.7 C) (02/17 2200) Pulse Rate:  [41-99] 93 (02/17 2200) Resp:  [16-34] 24 (02/17 2200) BP: (95-127)/(56-78) 127/74 mmHg (02/17 2200) SpO2:  [87 %-100 %] 99 % (02/17 2200) Arterial Line BP: (107-159)/(53-77)  158/77 mmHg (02/17 2200) FiO2 (%):  [40 %] 40 % (02/17 0739) Weight:  [116 lb 13.5 oz (53 kg)] 116 lb 13.5 oz (53 kg) (02/18 0600)  HEMODYNAMICS: CVP:  [5 mmHg] 5 mmHg VENTILATOR SETTINGS: Vent Mode:  [-] PSV;CPAP FiO2 (%):  [40 %] 40 % PEEP:  [5 cmH20] 5 cmH20 Pressure Support:  [5 cmH20] 5 cmH20  INTAKE / OUTPUT: Intake/Output     02/17 0701 - 02/18 0700   P.O. 100   I.V. (mL/kg) 1482.6 (28)   IV Piggyback 100   Total Intake(mL/kg) 1682.6 (31.7)   Urine (mL/kg/hr) 225 (0.2)   Emesis/NG output 900 (0.7)   Other -19 (-0)   Stool 325 (0.3)   Total Output 1431   Net +251.6        BP 127/74  Pulse 93  Temp(Src) 98.1 F (36.7 C) (Core (Comment))  Resp 24  Ht 5\' 9"  (1.753 m)  Wt 116 lb 13.5 oz (53 kg)  BMI 17.25 kg/m2  SpO2 99%   PHYSICAL EXAMINATION: General:  Awake, alert color is good.  Neuro:  Awake,alert, PERLA, oriented. No focal deficits, no focal deficits.  HEENT:  PERRL, OETT / OGT, poor dentition Cardiovascular:RRR, no m/r/g Lungs:  Bilateral very diminished air entry, mild rhonichi Abdomen:  Soft, mildly tender epigastric area, bowel sounds present Musculoskeletal:  No edema, R AKA Skin:  Sacral / gluteal decubiti   LABS:  CBC  Recent Labs Lab 09/11/13 0412 09/13/13 0400 09/13/13 1849 09/14/13 0455  WBC 11.1* 6.2  --  4.7  HGB 9.5* 9.2* 8.9* 8.9*  HCT 26.8* 26.3* 26.1* 26.2*  PLT 123* 61*  --  75*   Coag's  Recent Labs Lab 09/08/13 0020 09/09/13 0339 09/11/13 0412 09/12/13 0350 09/13/13 0400  APTT  --   --  36 37 36  INR 1.31 1.37  --   --   --    BMET  Recent Labs Lab 09/12/13 1641 09/13/13 0400 09/14/13 0455  NA 135* 136* 139  K 3.9 3.5* 3.0*  CL 99 100 102  CO2 24 25 25   BUN 17 13 19   CREATININE 1.14 0.98 1.59*  GLUCOSE 90 114* 104*   Electrolytes  Recent Labs Lab 09/12/13 1641 09/13/13 0400 09/13/13 0654 09/14/13 0455  CALCIUM 7.6* 7.5*  --  7.7*  MG 2.1  --  2.3 2.2  PHOS 1.9* 1.6*  --  3.3   Sepsis  Markers  Recent Labs Lab 09/07/13 2149 09/08/13 0020  LATICACIDVEN 6.11* 5.8*  PROCALCITON  --  4.57   ABG  Recent Labs Lab 09/12/13 0848 09/13/13 0345 09/13/13 0955  PHART 7.456* 7.520* 7.450  PCO2ART 18.5* 31.4* 37.1  PO2ART 130.0* 99.9 74.0*   Liver Enzymes  Recent Labs Lab 09/07/13 2047  09/12/13 1641 09/13/13 0400 09/14/13 0455  AST 32  --   --  19 19  ALT 17  --   --  16 14  ALKPHOS 140*  --   --  84 116  BILITOT 0.6  --   --  0.3 0.3  ALBUMIN 2.5*  < > 1.4* 1.4*  1.3* 1.4*  < > = values in this interval not displayed. Cardiac Enzymes  Recent Labs Lab 09/08/13 0020 09/08/13 0225 09/08/13 1116  TROPONINI <0.30 <0.30 <0.30   Glucose  Recent Labs Lab 09/13/13 0400 09/13/13 0829 09/13/13 1206 09/13/13 1513 09/13/13 1951 09/14/13 0048  GLUCAP 114* 110* 94 91 99 107*   CXR: Bronchitic changes without acute infiltrate.   ASSESSMENT / PLAN:  PULMONARY  A:  Acute respiratory failure COPD  Suspect very poor baseline pulmonary function P:  Albuterol PRN IS encouraged  CARDIOVASCULAR  A:  Septic shock  AF/RVR S/p R AKA Hx HTN, PVD Hypovolemia component 2/16 P:  Vascular following Goal MAP > 60 No anticoagulation with bleeding prior and small amount of bloody stool last night ECHO: EF 60-73%, grade 1 diastolic dys Still needs lines Dc a line  RENAL  A:  AKI rising crt Mild hypokalemia (3.0)  P:  Renal following  Off CRT since yesterday Cr mildly rising (0.98>1.59), likely d/t to NPO status  Repleted K today per central line x4. Hopefully will be able to advance diet and move to oral solution K repletion thereafter.  Will keep HD catheter for now Increase maintenance fluids  GASTROINTESTINAL  A:  Gastropathy Gastroparesis / high residuals Suspected GI hemorrhage, but no evidence on EGD / FS Nutrition GI Px SBO partial? S/p multiple BM 2/18 P:  Pt with multiple stools throughout the night. X1 bloody stool, clamp and add  clears, re assess output in 4 hrs  FOBT positive  Protonix BID D10 50 mL/h for hypoglycemia  Discontinue NG likely in am  LFT NL  HEMATOLOGIC  A:  Anemia VTE Px thrombocytopenia P:  Trend CBC, hb stable Platelets low at 75, from ~240 5 days ago. ? Sepsis vs HIT, moderate suspicion but had recent GI bleed concerns, hold empiric DTI  SCD Continue to hold Aranesp until HITT back and concerns for dvt risk reduced  INFECTIOUS  A:  RLE gangrene, r/o SBO P:  Zosyn will be discontinued after today WBC trending down  ENDOCRINE  A:  Normoglycemic on d10 P:  D10 @ 50 mL/hr Cortisol, 76 steroids not indicated CBG: 99-107 TSH normal  NEUROLOGIC  A:  Acute encephalopathy Hx alcohol abuse Anxiety P:  Fentanyl PRN Thiamine Pt/ ot conuslt  Summary: pt/ot, clamp start clears re assess output, keep HD , follow crt trend To sdu, to [place], [place] DO PGY-2 09/14/2013, 6:38 AM   I have fully examined this patient and agree with above findings.    And edited in full  [name]. Titus Mould, MD, [place] Pgr: [facility]

## 2013-09-14 NOTE — Progress Notes (Signed)
Patient ID: Edwin [name], male   DOB: 1955/11/01, 58 y.o.   MRN: [id number] S:Extubated yesterday and feels well but wants some water O:BP 121/74  Pulse 81  Temp(Src) 97.4 F (36.3 C) (Core (Comment))  Resp 28  Ht 5\' 9"  (1.753 m)  Wt 53 kg (116 lb 13.5 oz)  BMI 17.25 kg/m2  SpO2 99%  Intake/Output Summary (Last 24 hours) at 09/14/13 0908 Last data filed at 09/14/13 0800  Gross per 24 hour  Intake 2729.2 ml  Output   2159 ml  Net  570.2 ml   Intake/Output: I/O last 3 completed shifts: In: 3786.3 [P.O.:100; I.V.:3436.3; IV Piggyback:250] Out: 3082 [Urine:365; Emesis/NG output:1950; Other:192; Stool:575]  Intake/Output this shift:  Total I/O In: 100 [I.V.:50; IV Piggyback:50] Out: -  Weight change: -1.3 kg (-2 lb 13.9 oz) HLK:TGYB WM in NAD CVS:no rub Resp:cta Abd:+BS,soft, NT/ND Ext:s/p R AKA   Recent Labs Lab 09/07/13 2047  09/09/13 0339  09/10/13 1600 09/11/13 0412 09/11/13 1600 09/12/13 0350 09/12/13 1641 09/13/13 0400 09/14/13 0455  NA 124*  < > 136*  < > 138 142 140 135* 135* 136* 139  K >7.7*  < > 5.3  < > 4.0 3.6* 4.2 4.0 3.9 3.5* 3.0*  CL 81*  < > 97  < > 102 104 103 100 99 100 102  CO2 14*  < > 21  < > 19 23 23 25 24 25 25   GLUCOSE 92  < > 126*  < > 104* 107* 131* 116* 90 114* 104*  BUN 106*  < > 82*  < > 70* 41* 29* 20 17 13 19   CREATININE 5.75*  < > 3.87*  < > 3.00* 1.87* 1.38* 1.08 1.14 0.98 1.59*  ALBUMIN 2.5*  < > 1.6*  --  1.6*  --  1.7* 1.6* 1.4* 1.4*  1.3* 1.4*  CALCIUM 9.6  < > 7.3*  < > 7.4* 7.8* 8.0* 7.9* 7.6* 7.5* 7.7*  PHOS  --   < > 7.8*  < > 5.7* 3.2 2.6 1.8* 1.9* 1.6* 3.3  AST 32  --   --   --   --   --   --   --   --  19 19  ALT 17  --   --   --   --   --   --   --   --  16 14  < > = values in this interval not displayed. Liver Function Tests:  Recent Labs Lab 09/07/13 2047  09/12/13 1641 09/13/13 0400 09/14/13 0455  AST 32  --   --  19 19  ALT 17  --   --  16 14  ALKPHOS 140*  --   --  84 116  BILITOT 0.6  --   --  0.3  0.3  PROT 7.4  --   --  5.0* 5.0*  ALBUMIN 2.5*  < > 1.4* 1.4*  1.3* 1.4*  < > = values in this interval not displayed.  Recent Labs Lab 09/07/13 2047  LIPASE 127*   No results found for this basename: AMMONIA,  in the last 168 hours CBC:  Recent Labs Lab 09/07/13 2047  09/09/13 1730 09/10/13 0420 09/11/13 0412 09/13/13 0400 09/13/13 1849 09/14/13 0455  WBC 33.5*  < > 13.9* 11.5* 11.1* 6.2  --  4.7  NEUTROABS 30.8*  --   --   --   --   --   --  3.7  HGB 8.0*  < >  7.2* 6.6* 9.5* 9.2* 8.9* 8.9*  HCT 23.3*  < > 20.8* 19.1* 26.8* 26.3* 26.1* 26.2*  MCV 92.8  < > 93.7 93.2 89.0 91.6  --  92.6  PLT 529*  < > 211 170 123* 61*  --  75*  < > = values in this interval not displayed. Cardiac Enzymes:  Recent Labs Lab 09/08/13 0020 09/08/13 0225 09/08/13 1116  CKTOTAL 47  --   --   TROPONINI <0.30 <0.30 <0.30   CBG:  Recent Labs Lab 09/13/13 1513 09/13/13 1951 09/14/13 0048 09/14/13 0455 09/14/13 0736  GLUCAP 91 99 107* 102* 141*    Iron Studies: No results found for this basename: IRON, TIBC, TRANSFERRIN, FERRITIN,  in the last 72 hours Studies/Results: Ct Abdomen Pelvis Wo Contrast  09/12/2013   CLINICAL DATA:  Rule out obstruction.  Abdominal distention.  EXAM: CT ABDOMEN AND PELVIS WITHOUT CONTRAST  TECHNIQUE: Multidetector CT imaging of the abdomen and pelvis was performed following the standard protocol without intravenous contrast.  COMPARISON:  Plain films 09/12/2013 and 09/11/2013  FINDINGS: Lung bases demonstrate subtle reticulonodular pattern of opacification over the posterior lower lobes. Mild posterior bibasilar atelectatic change. Minimal contrast is present within the distal esophagus.  Abdominal images demonstrate the liver, spleen, pancreas, gallbladder, adrenal glands and kidneys to the within normal. There are calcifications over the kidneys bilaterally likely vascular. There is moderate calcified plaque involving the abdominal aorta and iliac vessels.  Infrarenal abdominal aorta measures 2.9 cm AP diameter.  There are multiple old dilated small bowel loops with minimal wall thickening of several mid small bowel loops. No definite transition point is seen. There appears to be a somewhat gradual transition to mildly prominent but nondilated terminal ileum. Fluid and air are present throughout the colon. No free peritoneal air. Appendix is not definitely seen although there appears to be an appendicolith at the origin. Minimal free fluid is present.  Pelvic images demonstrate a Foley catheter within a collapsed bladder. There are moderate degenerative changes of the spine. There is a mild grade 1 anterolisthesis of L4 on L5 due to an old L4 spondylolysis. There are degenerative changes of the hips.  IMPRESSION: Multiple dilated small bowel loops without definite transition point. There appears of the a gradual transition to more normal caliber terminal ileum. Minimal free fluid. Air-fluid throughout the colon. Findings may be due to ileus versus early/partial distal small bowel obstruction.  Subtle reticulonodular pattern of opacification over the posterior lower lobes likely due to an infectious versus inflammatory process. Recommend followup CT in 6-8 weeks after treatment.  Mild dilatation of the infrarenal abdominal aorta measuring 2.9 cm in AP diameter. Recommend followup ultrasound in 5 years. This recommendation follows ACR consensus guidelines: White Paper of the ACR Incidental Findings Committee II on Vascular Findings. J Am Coll Radiol [phone].  Degenerate changes the spine with grade 1 anterolisthesis of L4 on L5 due to chronic L4 spondylolysis.   Electronically Signed   By: Marin Olp M.D.   On: 09/12/2013 16:22   Dg Chest Port 1 View  09/13/2013   CLINICAL DATA:  Post extubation  EXAM: PORTABLE CHEST - 1 VIEW  COMPARISON:  Portable exam 1050 hr compared to 09/13/2013 at 0513 hr  FINDINGS: Endotracheal and nasogastric tubes no longer  identified.  Bilateral jugular lines unchanged.  Normal heart size, mediastinal contours and pulmonary vascularity.  Atherosclerotic calcification aorta.  Bronchitic changes without infiltrate, pleural effusion or pneumothorax.  Probable skin fold projects over right upper lobe  laterally.  IMPRESSION: Bronchitic changes without acute infiltrate.   Electronically Signed   By: Lavonia Dana M.D.   On: 09/13/2013 13:51   Dg Chest Port 1 View  09/13/2013   CLINICAL DATA:  Shortness of breath, ventilator.  EXAM: PORTABLE CHEST - 1 VIEW  COMPARISON:  09/12/2013  FINDINGS: Support devices are in stable position. Heart is normal size. Lungs are clear. No effusions or acute bony abnormality.  IMPRESSION: No acute cardiopulmonary disease.  Support devices stable.   Electronically Signed   By: Rolm Baptise M.D.   On: 09/13/2013 06:55   Dg Chest Port 1 View  09/12/2013   CLINICAL DATA:  Central line placement  EXAM: PORTABLE CHEST - 1 VIEW  COMPARISON:  Portable chest x-ray of 09/12/2013  FINDINGS: The tip of the endotracheal tube appears to be approximately 8.3 cm above the carina. A right central venous line has retracted slightly with the tip overlying the upper SVC. A left central venous line tip overlies the mid SVC. No pneumothorax is seen. The lungs are clear. Heart size is stable. An NG tube extends below the hemidiaphragm.  IMPRESSION: 1. New left central venous line tip overlies the mid SVC. No pneumothorax. 2. Tip of endotracheal tube approximately 8.3 cm above the carina   Electronically Signed   By: Ivar Drape M.D.   On: 09/12/2013 16:00   . antiseptic oral rinse  15 mL Mouth Rinse QID  . bisacodyl  10 mg Rectal Once  . chlorhexidine  15 mL Mouth Rinse BID  . collagenase   Topical Daily  . insulin aspart  0-9 Units Subcutaneous 6 times per day  . pantoprazole (PROTONIX) IV  40 mg Intravenous Q12H  . piperacillin-tazobactam (ZOSYN)  IV  3.375 g Intravenous 3 times per day  . potassium chloride  10 mEq  Intravenous Q1 Hr x 4  . thiamine  100 mg Intravenous Daily    BMET    Component Value Date/Time   NA 139 09/14/2013 0455   K 3.0* 09/14/2013 0455   CL 102 09/14/2013 0455   CO2 25 09/14/2013 0455   GLUCOSE 104* 09/14/2013 0455   BUN 19 09/14/2013 0455   CREATININE 1.59* 09/14/2013 0455   CALCIUM 7.7* 09/14/2013 0455   GFRNONAA 47* 09/14/2013 0455   GFRAA 54* 09/14/2013 0455   CBC    Component Value Date/Time   WBC 4.7 09/14/2013 0455   RBC 2.83* 09/14/2013 0455   HGB 8.9* 09/14/2013 0455   HCT 26.2* 09/14/2013 0455   PLT 75* 09/14/2013 0455   MCV 92.6 09/14/2013 0455   MCH 31.4 09/14/2013 0455   MCHC 34.0 09/14/2013 0455   RDW 16.6* 09/14/2013 0455   LYMPHSABS 0.6* 09/14/2013 0455   MONOABS 0.3 09/14/2013 0455   EOSABS 0.0 09/14/2013 0455   BASOSABS 0.0 09/14/2013 0455     Assessment/Plan:  1. Oliguric AKI in the setting of SIRS, NSAIDs and ACE and has required CRRT due to hyperkalemia and then continued this weekend due to increased need for pressor and minimal UOP.  1. Off of pressors but still remains oliguric  2. Will follow for possible IHD if his UOP does not improve but feel he is intravascularly volume depleted and would continue to try to keep him positive on the fluid balance in order to improve renal function 2. VDRF- extubated yesterday and doing well.  Plan per PCCM 3. Hypokalemia- replete and follow 4. PVD/gangrene s/p RAKA- on clinda/zosyn/vanc, follow vanc trough levels 5. ABLA- cont  to follow, on aranesp 6. HTN/Volume- stable, no evidence of volume overload 7. Protein malnutrition- unable to advance tube feeds. 8. Dispo- per PCCM 9.   Eads A

## 2013-09-14 NOTE — Progress Notes (Signed)
Rehab Admissions Coordinator Note:  Patient was screened by Cleatrice Burke for appropriateness for an Inpatient Acute Rehab Consult.  At this time,await inpt rehab consult completion.  Cleatrice Burke 09/14/2013, 3:33 PM  I can be reached at [phone].

## 2013-09-15 DIAGNOSIS — I739 Peripheral vascular disease, unspecified: Secondary | ICD-10-CM

## 2013-09-15 DIAGNOSIS — A419 Sepsis, unspecified organism: Principal | ICD-10-CM

## 2013-09-15 DIAGNOSIS — L98499 Non-pressure chronic ulcer of skin of other sites with unspecified severity: Secondary | ICD-10-CM

## 2013-09-15 DIAGNOSIS — N179 Acute kidney failure, unspecified: Secondary | ICD-10-CM

## 2013-09-15 DIAGNOSIS — J9600 Acute respiratory failure, unspecified whether with hypoxia or hypercapnia: Secondary | ICD-10-CM

## 2013-09-15 DIAGNOSIS — S88119A Complete traumatic amputation at level between knee and ankle, unspecified lower leg, initial encounter: Secondary | ICD-10-CM

## 2013-09-15 DIAGNOSIS — D62 Acute posthemorrhagic anemia: Secondary | ICD-10-CM

## 2013-09-15 LAB — CBC
HEMATOCRIT: 20.2 % — AB (ref 39.0–52.0)
HEMOGLOBIN: 6.7 g/dL — AB (ref 13.0–17.0)
MCH: 30.7 pg (ref 26.0–34.0)
MCHC: 33.2 g/dL (ref 30.0–36.0)
MCV: 92.7 fL (ref 78.0–100.0)
PLATELETS: 74 10*3/uL — AB (ref 150–400)
RBC: 2.18 MIL/uL — AB (ref 4.22–5.81)
RDW: 16 % — ABNORMAL HIGH (ref 11.5–15.5)
WBC: 3.6 10*3/uL — ABNORMAL LOW (ref 4.0–10.5)

## 2013-09-15 LAB — GLUCOSE, CAPILLARY
GLUCOSE-CAPILLARY: 101 mg/dL — AB (ref 70–99)
Glucose-Capillary: 97 mg/dL (ref 70–99)
Glucose-Capillary: 80 mg/dL (ref 70–99)
Glucose-Capillary: 175 mg/dL — ABNORMAL HIGH (ref 70–99)
Glucose-Capillary: 70 mg/dL (ref 70–99)

## 2013-09-15 LAB — BASIC METABOLIC PANEL
BUN: 17 mg/dL (ref 6–23)
BUN: 20 mg/dL (ref 6–23)
CALCIUM: 7.1 mg/dL — AB (ref 8.4–10.5)
CHLORIDE: 112 meq/L (ref 96–112)
CO2: 17 meq/L — AB (ref 19–32)
CO2: 20 mEq/L (ref 19–32)
CREATININE: 1.4 mg/dL — AB (ref 0.50–1.35)
Calcium: 5.5 mg/dL — CL (ref 8.4–10.5)
Chloride: 99 mEq/L (ref 96–112)
Creatinine, Ser: 1.71 mg/dL — ABNORMAL HIGH (ref 0.50–1.35)
GFR calc Af Amer: 63 mL/min — ABNORMAL LOW (ref >90)
GFR calc Af Amer: 50 mL/min — ABNORMAL LOW (ref >90)
GFR calc non Af Amer: 55 mL/min — ABNORMAL LOW (ref >90)
GFR calc non Af Amer: 43 mL/min — ABNORMAL LOW (ref >90)
GLUCOSE: 95 mg/dL (ref 70–99)
Glucose, Bld: 97 mg/dL (ref 70–99)
Potassium: 2.3 mEq/L — CL (ref 3.7–5.3)
Potassium: 3.6 mEq/L — ABNORMAL LOW (ref 3.7–5.3)
Sodium: 140 mEq/L (ref 137–147)
Sodium: 132 mEq/L — ABNORMAL LOW (ref 137–147)

## 2013-09-15 LAB — HEPARIN INDUCED THROMBOCYTOPENIA PNL
Heparin Induced Plt Ab: NEGATIVE
PATIENT O. D.: 0.047
UFH High Dose UFH H: 0 % Release
UFH LOW DOSE 0.1 IU/ML: 0 %
UFH LOW DOSE 0.5 IU/ML: 0 %
UFH SRA Result: NEGATIVE

## 2013-09-15 LAB — HEMOGLOBIN AND HEMATOCRIT, BLOOD
HCT: 24.7 % — ABNORMAL LOW (ref 39.0–52.0)
Hemoglobin: 8.4 g/dL — ABNORMAL LOW (ref 13.0–17.0)

## 2013-09-15 LAB — PREPARE RBC (CROSSMATCH)

## 2013-09-15 MED ORDER — POTASSIUM CHLORIDE 10 MEQ/100ML IV SOLN
10.0000 meq | INTRAVENOUS | Status: AC
  Administered 2013-09-15 (×2): 10 meq via INTRAVENOUS
  Filled 2013-09-15: qty 100

## 2013-09-15 MED ORDER — STERILE WATER FOR INJECTION IV SOLN
150.0000 meq | INTRAVENOUS | Status: DC
  Administered 2013-09-15 – 2013-09-17 (×3): 150 meq via INTRAVENOUS
  Filled 2013-09-15 (×7): qty 850

## 2013-09-15 MED ORDER — POTASSIUM CHLORIDE CRYS ER 20 MEQ PO TBCR
40.0000 meq | EXTENDED_RELEASE_TABLET | Freq: Once | ORAL | Status: AC
  Administered 2013-09-15: 40 meq via ORAL
  Filled 2013-09-15: qty 2

## 2013-09-15 MED ORDER — ZOLPIDEM TARTRATE 5 MG PO TABS
5.0000 mg | ORAL_TABLET | Freq: Every evening | ORAL | Status: DC | PRN
  Administered 2013-09-15 – 2013-09-16 (×2): 5 mg via ORAL
  Filled 2013-09-15 (×2): qty 1

## 2013-09-15 NOTE — Progress Notes (Addendum)
CRITICAL VALUE ALERT  Critical value received:  Hgb 6.7  Date of notification:  09/15/13  Time of notification:  0650  Critical value read back:yes  Nurse who received alert:  Karma Ganja  MD notified (1st page):  PCCM  Time of first page:  0700  MD notified (2nd page):  Time of second page:  Responding MD:   Time MD responded:    Day shift RN notified of result as well: Brewing technologist

## 2013-09-15 NOTE — Plan of Care (Signed)
Hgb down from 8.9 to 6.7. Nephro notes reviewed and pt felt to be dry-Recent CVP readings between 3-4. Will check H/H to confirm before give PRBCs  Erin Hearing, ANP

## 2013-09-15 NOTE — Progress Notes (Signed)
Patient will need SNF placement for he lacks the supports needed with his home situation. He will not reach a level to be completely independent after an inpt rehab stay. Please call me with any questions. Discussed in long LOS meeting today. [phone]
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
## 2013-09-15 NOTE — Progress Notes (Signed)
Patient ID: Edwin [name], male   DOB: 1956/07/25, 58 y.o.   MRN: [id number] S:wants his NGT out O:BP 142/85  Pulse 83  Temp(Src) 97.8 F (36.6 C) (Oral)  Resp 28  Ht 5\' 9"  (1.753 m)  Wt 53 kg (116 lb 13.5 oz)  BMI 17.25 kg/m2  SpO2 96%  Intake/Output Summary (Last 24 hours) at 09/15/13 0929 Last data filed at 09/15/13 0847  Gross per 24 hour  Intake 4992.5 ml  Output    600 ml  Net 4392.5 ml   Intake/Output: I/O last 3 completed shifts: In: 5947.5 [P.O.:960; I.V.:4675; IV Piggyback:312.5] Out: 2100 [Urine:775; Emesis/NG output:750; Stool:575]  Intake/Output this shift:  Total I/O In: 720 [P.O.:720] Out: -  Weight change: 0 kg (0 lb) KGU:RKYHCWCBJ WM in NAD CVS:no rub Resp:scattered rhonchi Abd:+BS, soft SEG:BTDVVOHY changes to left toes, s/p right AKA   Recent Labs Lab 09/09/13 0339  09/10/13 1600 09/11/13 0412 09/11/13 1600 09/12/13 0350 09/12/13 1641 09/13/13 0400 09/14/13 0455 09/15/13 0615  NA 136*  < > 138 142 140 135* 135* 136* 139 140  K 5.3  < > 4.0 3.6* 4.2 4.0 3.9 3.5* 3.0* 2.3*  CL 97  < > 102 104 103 100 99 100 102 112  CO2 21  < > 19 23 23 25 24 25 25  17*  GLUCOSE 126*  < > 104* 107* 131* 116* 90 114* 104* 95  BUN 82*  < > 70* 41* 29* 20 17 13 19 17   CREATININE 3.87*  < > 3.00* 1.87* 1.38* 1.08 1.14 0.98 1.59* 1.40*  ALBUMIN 1.6*  --  1.6*  --  1.7* 1.6* 1.4* 1.4*  1.3* 1.4*  --   CALCIUM 7.3*  < > 7.4* 7.8* 8.0* 7.9* 7.6* 7.5* 7.7* 5.5*  PHOS 7.8*  < > 5.7* 3.2 2.6 1.8* 1.9* 1.6* 3.3  --   AST  --   --   --   --   --   --   --  19 19  --   ALT  --   --   --   --   --   --   --  16 14  --   < > = values in this interval not displayed. Liver Function Tests:  Recent Labs Lab 09/12/13 1641 09/13/13 0400 09/14/13 0455  AST  --  19 19  ALT  --  16 14  ALKPHOS  --  84 116  BILITOT  --  0.3 0.3  PROT  --  5.0* 5.0*  ALBUMIN 1.4* 1.4*  1.3* 1.4*   No results found for this basename: LIPASE, AMYLASE,  in the last 168 hours No results  found for this basename: AMMONIA,  in the last 168 hours CBC:  Recent Labs Lab 09/10/13 0420 09/11/13 0412 09/13/13 0400  09/14/13 0455 09/15/13 0615 09/15/13 0820  WBC 11.5* 11.1* 6.2  --  4.7 3.6*  --   NEUTROABS  --   --   --   --  3.7  --   --   HGB 6.6* 9.5* 9.2*  < > 8.9* 6.7* 8.4*  HCT 19.1* 26.8* 26.3*  < > 26.2* 20.2* 24.7*  MCV 93.2 89.0 91.6  --  92.6 92.7  --   PLT 170 123* 61*  --  75* 74*  --   < > = values in this interval not displayed. Cardiac Enzymes:  Recent Labs Lab 09/08/13 1116  TROPONINI <0.30   CBG:  Recent Labs Lab 09/14/13  1724 09/14/13 2055 09/14/13 2350 09/15/13 0436 09/15/13 0827  GLUCAP 108* 81 89 97 80    Iron Studies: No results found for this basename: IRON, TIBC, TRANSFERRIN, FERRITIN,  in the last 72 hours Studies/Results: Dg Chest Port 1 View  09/13/2013   CLINICAL DATA:  Post extubation  EXAM: PORTABLE CHEST - 1 VIEW  COMPARISON:  Portable exam 1050 hr compared to 09/13/2013 at 0513 hr  FINDINGS: Endotracheal and nasogastric tubes no longer identified.  Bilateral jugular lines unchanged.  Normal heart size, mediastinal contours and pulmonary vascularity.  Atherosclerotic calcification aorta.  Bronchitic changes without infiltrate, pleural effusion or pneumothorax.  Probable skin fold projects over right upper lobe laterally.  IMPRESSION: Bronchitic changes without acute infiltrate.   Electronically Signed   By: Lavonia Dana M.D.   On: 09/13/2013 13:51   . antiseptic oral rinse  15 mL Mouth Rinse QID  . bisacodyl  10 mg Rectal Once  . chlorhexidine  15 mL Mouth Rinse BID  . collagenase   Topical Daily  . insulin aspart  0-9 Units Subcutaneous 6 times per day  . pantoprazole (PROTONIX) IV  40 mg Intravenous Q12H  . potassium chloride  40 mEq Oral Once  . sucralfate  1 g Oral TID WC & HS  . thiamine  100 mg Intravenous Daily    BMET    Component Value Date/Time   NA 140 09/15/2013 0615   K 2.3* 09/15/2013 0615   CL 112 09/15/2013  0615   CO2 17* 09/15/2013 0615   GLUCOSE 95 09/15/2013 0615   BUN 17 09/15/2013 0615   CREATININE 1.40* 09/15/2013 0615   CALCIUM 5.5* 09/15/2013 0615   GFRNONAA 55* 09/15/2013 0615   GFRAA 63* 09/15/2013 0615   CBC    Component Value Date/Time   WBC 3.6* 09/15/2013 0615   RBC 2.18* 09/15/2013 0615   HGB 8.4* 09/15/2013 0820   HCT 24.7* 09/15/2013 0820   PLT 74* 09/15/2013 0615   MCV 92.7 09/15/2013 0615   MCH 30.7 09/15/2013 0615   MCHC 33.2 09/15/2013 0615   RDW 16.0* 09/15/2013 0615   LYMPHSABS 0.6* 09/14/2013 0455   MONOABS 0.3 09/14/2013 0455   EOSABS 0.0 09/14/2013 0455   BASOSABS 0.0 09/14/2013 0455    Assessment/Plan:  1. Oliguric AKI in the setting of SIRS, NSAIDs and ACE and has required CRRT due to hyperkalemia and then continued this weekend due to increased need for pressor and minimal UOP.  1. Off of pressors with marked increase in UOP and improved Scr off of RRT 2. Will follow for possible IHD if his UOP drops off and/or electrolyte abnormalities arise.   3. Appears to be volume depleted and would continue to try to keep him positive on the fluid balance in order to improve renal function 2. VDRF- extubated yesterday and doing well. Plan per PCCM 3. Hypokalemia- replete with 2 runs of IV KCl (already given 50mEq po x 2) and follow after bicarb is started.   4. Metabolic acidosis- would add IV bicarb once K is repleted 5. PVD/gangrene s/p RAKA- on clinda/zosyn/vanc, follow vanc trough levels 6. ABLA- cont to follow, on aranesp 7. HTN/Volume- stable, no evidence of volume overload 8. Protein malnutrition- unable to advance tube feeds. 9. Dispo- per PCCM 10.   [place] A

## 2013-09-15 NOTE — Progress Notes (Addendum)
Physical Therapy Wound Treatment Patient Details  Name: Edwin [name] MRN: [id number] Date of Birth: 06/29/1956  Today's Date: 09/15/2013 Time: 8546-2703 Time Calculation (min): 33 min  Subjective  Subjective: "Take it easy," pt stated.  Pain Score: Pain Score: 7   Wound Assessment  Pressure Ulcer 09/08/13 Unstageable - Full thickness tissue loss in which the base of the ulcer is covered by slough (yellow, tan, gray, green or brown) and/or eschar (tan, brown or black) in the wound bed. unstageable wound under left buttock and thigh (Active)  Dressing Type Moist to dry; santyl;Foam;Barrier Film (skin prep) 09/15/2013 10:10 AM  Dressing Changed 09/15/2013 10:10 AM  Dressing Change Frequency Daily 09/15/2013 10:10 AM  State of Healing Early/partial granulation 09/15/2013 10:10 AM  Site / Wound Assessment Yellow;Brown 09/15/2013 10:10 AM  % Wound base Red or Granulating 20% 09/15/2013 10:10 AM  % Wound base Yellow 60% 09/15/2013 10:10 AM  % Wound base Black 20% 09/15/2013 10:10 AM  % Wound base Other (Comment) 0% 09/15/2013 10:10 AM  Peri-wound Assessment Intact 09/15/2013 10:10 AM  Wound Length (cm) 9 cm 09/13/2013  9:45 AM  Wound Width (cm) 6 cm 09/13/2013  9:45 AM  Wound Depth (cm) 0 cm 09/13/2013  9:45 AM  Drainage Amount Scant 09/15/2013 10:10 AM  Drainage Description Serous 09/15/2013 10:10 AM  Treatment Debridement (Selective);Hydrotherapy (Pulse lavage);Packing (Saline gauze) 09/15/2013 10:10 AM  State of Healing Early/partial granulation 09/14/2013  8:52 PM  Site / Wound Assessment Yellow;Pink 09/14/2013  8:52 PM  Peri-wound Assessment Intact 09/14/2013  8:52 PM  Drainage Amount Minimal 09/14/2013  8:52 PM  Drainage Description Serous 09/14/2013  8:52 PM  Treatment Cleansed 09/13/2013  8:00 PM      Hydrotherapy Pulsed lavage therapy - wound location: Lt ischium Pulsed Lavage with Suction (psi): 8 psi Pulsed Lavage with Suction - Normal Saline Used: 1000 mL Pulsed Lavage Tip: Tip with  splash shield Selective Debridement Selective Debridement - Location: lt ischium Selective Debridement - Tools Used: Forceps;Scissors Selective Debridement - Tissue Removed: brown and yellow necrotic tissue   Wound Assessment and Plan  Wound Therapy - Assess/Plan/Recommendations Wound Therapy - Clinical Statement: Wound improving and eschar being removed. Hydrotherapy Plan: Debridement;Dressing change;Patient/family education;Pulsatile lavage with suction Wound Therapy - Frequency: 6X / week Wound Therapy - Follow Up Recommendations: Other (comment) (CIR) Wound Plan: See above  Wound Therapy Goals- Improve the function of patient's integumentary system by progressing the wound(s) through the phases of wound healing (inflammation - proliferation - remodeling) by: Decrease Necrotic Tissue to: 25 Decrease Necrotic Tissue - Progress: Progressing toward goal Increase Granulation Tissue to: 75 Increase Granulation Tissue - Progress: Progressing toward goal  Goals will be updated until maximal potential achieved or discharge criteria met.  Discharge criteria: when goals achieved, discharge from hospital, MD decision/surgical intervention, no progress towards goals, refusal/missing three consecutive treatments without notification or medical reason.  GP     P[place]on Pam 09/15/2013, 10:14 AM  [name] PT [phone]

## 2013-09-15 NOTE — Progress Notes (Signed)
CRITICAL VALUE ALERT  Critical value received:  K 2.6 and Ca 5.8   Date of notification:  09/15/13  Time of notification:  0715  Critical value read back:yes  Nurse who received alert:  Karma Ganja  MD notified (1st page):  PCCM  Time of first page:  0715  MD notified (2nd page):  Time of second page:  Responding MD:    Time MD responded:   Day Shift RN Notified: Brewing technologist

## 2013-09-15 NOTE — Progress Notes (Signed)
[facility] PROGRESS NOTE  Edwin [name] [id number] DOB: 1956-05-11 DOA: 09/07/2013 PCP: Webb Silversmith, NP  Brief narrative: Mr. Schaver is a 58yo man with severe PAD who was admitted on 2/12 with septic shock, ARF, metabolic acidosis and a non healing gangrenous RLE ulcer.  He has had a complicated medical course including an endoscopy/flex sig showing no active bleeding and multiple shallow gastric ulcers.  There has been concern for GI Bleeding and he was noted to have a drop in Hgb on 2/19 for which he received 1 unit of PRBC, Rt AKA on 2/13. Partial SBO vs. Ileus noted on CT Abdomen on 2/16.  He also underwent a TTE on 2/17 showing grade 1 diastolic dysfunction.  He was treated with ABx for his sepsis with Vancomycin (stopped 2/17), Zosyn (stopped 2/18) and Clindamycin (stopped 2/16).  He was successfully extubated on 2/17 and had an aline until 2/18.  He has a central line in place and a foley catheter.   Assessment/Plan Principal Problem:  Gangrene of lower extremity with concomitant septic shock, metabolic acidosis - WBC trending down, now 3.6 - Antibiotics completed as of 2/18 - Vascular following, no new recommendations per most recent note.   Active Problems: Anemia, due to ? GIB - Trend CBC, required 1 unit PRBC today due to H/H dropping to 6.7, responded appropriately to 8.4 - Follow closely, transfuse as needed.   - He is on IV protonix, continue therapy.   - If continues to bleed, may need repeat consultation with GI.  - Check repeat CBC at 5pm, replete blood as needed.   Thrombocytopenia - HIT panel pending (in process) - Hold anticoagulation until HIT panel back - SCDs  Acute respiratory failure due to COPD and above - Extubated successfully - Albuterol PRN - IS - O2 as needed  Partial SBO/Ileus - Resolving, multiple BM 2/18, one with blood - Protonix BID for ulcers as noted on EGD - NGT d/c this morning and patient tolerating liquid diet, advance  tomorrow as tolerated.    Acute renal failure - Nephrology is following along, recommend + fluid balance - Required CRT but has been off since 2/17 - Cr is elevated to 1.71 today, which is worsening since going off CRT - IVF at 100cc/hr NS and 50cc/hr D10 - Clear liquid diet, improved appetite today  Hyperkalemia - Resolved and actually low today to 2.3, up to 3.6 with repletion.  - Monitor K levels closely  Decubitus ulcer of sacral region, unstageable - WOC consult if not already done  Protein-calorie malnutrition, severe - Also with hypoglycemia previously, now normoglycemic on D10 infusion - Continue D10 - Consider nutrition consult  HTN, Afib with RVR, PVD, CAD - Goal MAP around 60, has had issues with hypotension this hospitalization due to sepsis but BP improved today - Grade 1 diastolic dysfunction per TTE - Monitor vitals closely  Consultants:  Vascular  Nephrology  PMR  Procedures/Studies:  No recent  Antibiotics:  Completed course  Code Status: Full Family Communication: Pt at bedside Disposition Plan: CIR recommended, but CIR feels SNF more appropriate, will need coordination with SW  HPI/Subjective: Low Hgb this AM, multiple BM, one with blood.  Required blood transfusion.  Otherwise, patient doing well and in good spirits.  Complains only of pain at surgical site.   Objective: Filed Vitals:   09/15/13 0001 09/15/13 0415 09/15/13 0845 09/15/13 1139  BP:  146/81 142/85 146/81  Pulse:  87 83 83  Temp: 98 F (36.7 C)  97.9 F (36.6 C) 97.8 F (36.6 C) 98.1 F (36.7 C)  TempSrc: Oral Oral Oral Oral  Resp:  25 28 23   Height:      Weight:  116 lb 13.5 oz (53 kg)    SpO2:  99% 96% 97%    Intake/Output Summary (Last 24 hours) at 09/15/13 1357 Last data filed at 09/15/13 1200  Gross per 24 hour  Intake 4282.5 ml  Output    525 ml  Net 3757.5 ml    Exam:   General:  Pt is alert, follows commands appropriately, not in acute distress, thin.   HD catheter in place in [facility].  CVL in place in [facility]  Cardiovascular: Regular rate and rhythm, S1/S2  Respiratory: Diminished air movement bilaterally, no wheezing, not on oxygen  Abdomen: Soft, non tender, non distended, bowel sounds present  Extremities: No edema in left LE, + bruises vs. Necrotic skin on multiple toes, wound on heal with bandage covering.  RLE s/p AKA with stocking in place.   Neuro: Grossly nonfocal  Data Reviewed: Basic Metabolic Panel:  Recent Labs Lab 09/11/13 1600 09/11/13 1700 09/12/13 0350 09/12/13 1641 09/13/13 0400 09/13/13 0654 09/14/13 0455 09/15/13 0615 09/15/13 1207  NA 140  --  135* 135* 136*  --  139 140 132*  K 4.2  --  4.0 3.9 3.5*  --  3.0* 2.3* 3.6*  CL 103  --  100 99 100  --  102 112 99  CO2 23  --  25 24 25   --  25 17* 20  GLUCOSE 131*  --  116* 90 114*  --  104* 95 97  BUN 29*  --  20 17 13   --  19 17 20   CREATININE 1.38*  --  1.08 1.14 0.98  --  1.59* 1.40* 1.71*  CALCIUM 8.0*  --  7.9* 7.6* 7.5*  --  7.7* 5.5* 7.1*  MG  --  2.3 2.3 2.1  --  2.3 2.2  --   --   PHOS 2.6  --  1.8* 1.9* 1.6*  --  3.3  --   --    Liver Function Tests:  Recent Labs Lab 09/11/13 1600 09/12/13 0350 09/12/13 1641 09/13/13 0400 09/14/13 0455  AST  --   --   --  19 19  ALT  --   --   --  16 14  ALKPHOS  --   --   --  84 116  BILITOT  --   --   --  0.3 0.3  PROT  --   --   --  5.0* 5.0*  ALBUMIN 1.7* 1.6* 1.4* 1.4*  1.3* 1.4*   No results found for this basename: LIPASE, AMYLASE,  in the last 168 hours No results found for this basename: AMMONIA,  in the last 168 hours CBC:  Recent Labs Lab 09/10/13 0420 09/11/13 0412 09/13/13 0400 09/13/13 1849 09/14/13 0455 09/15/13 0615 09/15/13 0820  WBC 11.5* 11.1* 6.2  --  4.7 3.6*  --   NEUTROABS  --   --   --   --  3.7  --   --   HGB 6.6* 9.5* 9.2* 8.9* 8.9* 6.7* 8.4*  HCT 19.1* 26.8* 26.3* 26.1* 26.2* 20.2* 24.7*  MCV 93.2 89.0 91.6  --  92.6 92.7  --   PLT 170 123* 61*  --  75* 74*  --     Cardiac Enzymes: No results found for this basename: CKTOTAL, CKMB, CKMBINDEX, TROPONINI,  in the last 168 hours BNP: No components found with this basename: POCBNP,  CBG:  Recent Labs Lab 09/14/13 2055 09/14/13 2350 09/15/13 0436 09/15/13 0827 09/15/13 1139  GLUCAP 81 89 97 80 101*    Recent Results (from the past 240 hour(s))  CULTURE, BLOOD (ROUTINE X 2)     Status: None   Collection Time    09/07/13  9:39 PM      Result Value Ref Range Status   Specimen Description BLOOD ARM RIGHT   Final   Special Requests BOTTLES DRAWN AEROBIC AND ANAEROBIC B 10CC R 5CC   Final   Culture  Setup Time     Final   Value: 09/08/2013 05:04     Performed at [facility]   Culture     Final   Value: NO GROWTH 5 DAYS     Performed at [facility]   Report Status 09/14/2013 FINAL   Final  CULTURE, BLOOD (ROUTINE X 2)     Status: None   Collection Time    09/08/13 12:20 AM      Result Value Ref Range Status   Specimen Description BLOOD LEFT ARM   Final   Special Requests BOTTLES DRAWN AEROBIC ONLY 10CC   Final   Culture  Setup Time     Final   Value: 09/08/2013 05:05     Performed at [facility]   Culture     Final   Value: NO GROWTH 5 DAYS     Performed at [facility]   Report Status 09/14/2013 FINAL   Final  URINE CULTURE     Status: None   Collection Time    09/08/13  1:10 AM      Result Value Ref Range Status   Specimen Description URINE, CATHETERIZED   Final   Special Requests NONE   Final   Culture  Setup Time     Final   Value: 09/08/2013 05:42     Performed at [place]     Final   Value: NO GROWTH     Performed at [facility]   Culture     Final   Value: NO GROWTH     Performed at [facility]   Report Status 09/09/2013 FINAL   Final  MRSA PCR SCREENING     Status: None   Collection Time    09/08/13  1:10 AM      Result Value Ref Range Status   MRSA by PCR NEGATIVE  NEGATIVE Final    Comment:            The GeneXpert MRSA Assay (FDA     approved for NASAL specimens     only), is one component of a     comprehensive MRSA colonization     surveillance program. It is not     intended to diagnose MRSA     infection nor to guide or     monitor treatment for     MRSA infections.  STOOL CULTURE     Status: None   Collection Time    09/08/13  2:55 PM      Result Value Ref Range Status   Specimen Description PERIRECTAL   Final   Special Requests Normal   Final   Culture     Final   Value: NO SALMONELLA, SHIGELLA, CAMPYLOBACTER, YERSINIA, OR E.COLI 0157:H7 ISOLATED     Performed at [facility]  Report Status 09/12/2013 FINAL   Final  CLOSTRIDIUM DIFFICILE BY PCR     Status: None   Collection Time    09/08/13  2:55 PM      Result Value Ref Range Status   C difficile by pcr NEGATIVE  NEGATIVE Final     Scheduled Meds: . antiseptic oral rinse  15 mL Mouth Rinse QID  . bisacodyl  10 mg Rectal Once  . chlorhexidine  15 mL Mouth Rinse BID  . collagenase   Topical Daily  . insulin aspart  0-9 Units Subcutaneous 6 times per day  . pantoprazole (PROTONIX) IV  40 mg Intravenous Q12H  . potassium chloride  10 mEq Intravenous Q1 Hr x 2  . sucralfate  1 g Oral TID WC & HS  . thiamine  100 mg Intravenous Daily   Continuous Infusions: . sodium chloride 100 mL/hr at 09/15/13 0929  . dextrose 50 mL/hr at 09/14/13 1333  .  sodium bicarbonate 150 mEq in sterile water 1000 mL infusion 150 mEq (09/15/13 1226)     Gilles Chiquito, MD  [place] Pager [phone]  If 7PM-7AM, please contact night-coverage [email or web address] Password [email or web address] 09/15/2013, 1:57 PM   LOS: 8 days

## 2013-09-15 NOTE — Progress Notes (Signed)
Anemia, Hb 6.7, 1 unit PRBC ordered Hypokalemia, K 2.3, KDur 40 x 1 ordered

## 2013-09-15 NOTE — Progress Notes (Signed)
[facility]   Subjective  - Doing well A & O today in a good mood.   Objective 146/81 87 97.9 F (36.6 C) (Oral) 25 99%  Intake/Output Summary (Last 24 hours) at 09/15/13 0821 Last data filed at 09/15/13 0600  Gross per 24 hour  Intake 4322.5 ml  Output    700 ml  Net 3622.5 ml    Right Amputation site C/D/I. [employer]  stockinet in place.  Assessment/Planning: POD #5 Right AKA    Laurence Slate [facility] 09/15/2013 8:21 AM --  Laboratory Lab Results:  Recent Labs  09/14/13 0455 09/15/13 0615  WBC 4.7 3.6*  HGB 8.9* 6.7*  HCT 26.2* 20.2*  PLT 75* 74*   BMET  Recent Labs  09/14/13 0455 09/15/13 0615  NA 139 140  K 3.0* 2.3*  CL 102 112  CO2 25 17*  GLUCOSE 104* 95  BUN 19 17  CREATININE 1.59* 1.40*  CALCIUM 7.7* 5.5*    COAG Lab Results  Component Value Date   INR 1.37 09/09/2013   INR 1.31 09/08/2013   INR 0.94 08/16/2013   No results found for this basename: PTT

## 2013-09-16 ENCOUNTER — Telehealth: Payer: Self-pay | Admitting: Vascular Surgery

## 2013-09-16 ENCOUNTER — Encounter: Payer: Self-pay | Admitting: Cardiovascular Disease

## 2013-09-16 LAB — GLUCOSE, CAPILLARY
GLUCOSE-CAPILLARY: 78 mg/dL (ref 70–99)
GLUCOSE-CAPILLARY: 78 mg/dL (ref 70–99)
GLUCOSE-CAPILLARY: 89 mg/dL (ref 70–99)
Glucose-Capillary: 78 mg/dL (ref 70–99)
Glucose-Capillary: 48 mg/dL — ABNORMAL LOW (ref 70–99)

## 2013-09-16 LAB — CBC
HCT: 25.1 % — ABNORMAL LOW (ref 39.0–52.0)
HEMOGLOBIN: 8.5 g/dL — AB (ref 13.0–17.0)
MCH: 31.3 pg (ref 26.0–34.0)
MCHC: 33.9 g/dL (ref 30.0–36.0)
MCV: 92.3 fL (ref 78.0–100.0)
Platelets: 118 10*3/uL — ABNORMAL LOW (ref 150–400)
RBC: 2.72 MIL/uL — AB (ref 4.22–5.81)
RDW: 15.5 % (ref 11.5–15.5)
WBC: 3.4 10*3/uL — ABNORMAL LOW (ref 4.0–10.5)

## 2013-09-16 LAB — RENAL FUNCTION PANEL
ALBUMIN: 1.2 g/dL — AB (ref 3.5–5.2)
BUN: 19 mg/dL (ref 6–23)
CHLORIDE: 102 meq/L (ref 96–112)
CO2: 21 meq/L (ref 19–32)
CREATININE: 1.62 mg/dL — AB (ref 0.50–1.35)
Calcium: 7.1 mg/dL — ABNORMAL LOW (ref 8.4–10.5)
GFR calc Af Amer: 53 mL/min — ABNORMAL LOW (ref >90)
GFR calc non Af Amer: 46 mL/min — ABNORMAL LOW (ref >90)
GLUCOSE: 82 mg/dL (ref 70–99)
Phosphorus: 2.4 mg/dL (ref 2.3–4.6)
Potassium: 3.9 mEq/L (ref 3.7–5.3)
Sodium: 133 mEq/L — ABNORMAL LOW (ref 137–147)

## 2013-09-16 MED ORDER — OXYCODONE HCL 5 MG PO TABS
5.0000 mg | ORAL_TABLET | ORAL | Status: DC | PRN
  Administered 2013-09-16 – 2013-09-19 (×13): 10 mg via ORAL
  Filled 2013-09-16 (×14): qty 2

## 2013-09-16 MED ORDER — ACETAMINOPHEN 325 MG PO TABS: 650.0000 mg | ORAL_TABLET | Freq: Four times a day (QID) | ORAL | Status: DC | PRN

## 2013-09-16 MED ORDER — FENTANYL CITRATE 0.05 MG/ML IJ SOLN
12.5000 ug | INTRAMUSCULAR | Status: DC | PRN
  Administered 2013-09-16 – 2013-09-19 (×15): 25 ug via INTRAVENOUS
  Filled 2013-09-16 (×15): qty 2

## 2013-09-16 MED ORDER — ENSURE COMPLETE PO LIQD
237.0000 mL | Freq: Two times a day (BID) | ORAL | Status: DC
  Administered 2013-09-16 – 2013-09-21 (×8): 237 mL via ORAL

## 2013-09-16 MED ORDER — MORPHINE SULFATE 2 MG/ML IJ SOLN: 1.0000 mg | INTRAMUSCULAR | Status: DC | PRN

## 2013-09-16 NOTE — Progress Notes (Signed)
Patient ID: Edwin [name], male   DOB: 1955-11-09, 58 y.o.   MRN: [id number] S:feels good, no new c/o O:BP 139/89  Pulse 97  Temp(Src) 98.1 F (36.7 C) (Oral)  Resp 18  Ht 5\' 9"  (1.753 m)  Wt 60.9 kg (134 lb 4.2 oz)  BMI 19.82 kg/m2  SpO2 99%  Intake/Output Summary (Last 24 hours) at 09/16/13 1235 Last data filed at 09/16/13 1100  Gross per 24 hour  Intake   1760 ml  Output   1900 ml  Net   -140 ml   Intake/Output: I/O last 3 completed shifts: In: 6269 [P.O.:840; I.V.:3650; IV Piggyback:100] Out: 1975 [SWNIO:2703]  Intake/Output this shift:  Total I/O In: 540 [P.O.:240; I.V.:300] Out: 300 [Urine:300] Weight change:  JKK:XFGHWEXHB WM in NAd CVS:no rub Resp:cta ZJI:RCVELF Ext:s/o R AKA, ischemic changes to left foot   Recent Labs Lab 09/10/13 1600 09/11/13 0412 09/11/13 1600 09/12/13 0350 09/12/13 1641 09/13/13 0400 09/14/13 0455 09/15/13 0615 09/15/13 1207 09/16/13 0500  NA 138 142 140 135* 135* 136* 139 140 132* 133*  K 4.0 3.6* 4.2 4.0 3.9 3.5* 3.0* 2.3* 3.6* 3.9  CL 102 104 103 100 99 100 102 112 99 102   CO2 19 23 23 25 24 25 25  17* 20 21  GLUCOSE 104* 107* 131* 116* 90 114* 104* 95 97 82  BUN 70* 41* 29* 20 17 13 19 17 20 19   CREATININE 3.00* 1.87* 1.38* 1.08 1.14 0.98 1.59* 1.40* 1.71* 1.62*  ALBUMIN 1.6*  --  1.7* 1.6* 1.4* 1.4*  1.3* 1.4*  --   --  1.2*  CALCIUM 7.4* 7.8* 8.0* 7.9* 7.6* 7.5* 7.7* 5.5* 7.1* 7.1*  PHOS 5.7* 3.2 2.6 1.8* 1.9* 1.6* 3.3  --   --  2.4  AST  --   --   --   --   --  19 19  --   --   --   ALT  --   --   --   --   --  16 14  --   --   --    Liver Function Tests:  Recent Labs Lab 09/13/13 0400 09/14/13 0455 09/16/13 0500  AST 19 19  --   ALT 16 14  --   ALKPHOS 84 116  --   BILITOT 0.3 0.3  --   PROT 5.0* 5.0*  --   ALBUMIN 1.4*  1.3* 1.4* 1.2*   No results found for this basename: LIPASE, AMYLASE,  in the last 168 hours No results found for this basename: AMMONIA,  in the last 168 hours CBC:  Recent  Labs Lab 09/11/13 0412 09/13/13 0400  09/14/13 0455 09/15/13 0615 09/15/13 0820 09/16/13 0500  WBC 11.1* 6.2  --  4.7 3.6*  --  3.4*  NEUTROABS  --   --   --  3.7  --   --   --   HGB 9.5* 9.2*  < > 8.9* 6.7* 8.4* 8.5*  HCT 26.8* 26.3*  < > 26.2* 20.2* 24.7* 25.1*  MCV 89.0 91.6  --  92.6 92.7  --  92.3  PLT 123* 61*  --  75* 74*  --  118*  < > = values in this interval not displayed. Cardiac Enzymes: No results found for this basename: CKTOTAL, CKMB, CKMBINDEX, TROPONINI,  in the last 168 hours CBG:  Recent Labs Lab 09/15/13 2114 09/16/13 0012 09/16/13 0417 09/16/13 0759 09/16/13 1119  GLUCAP 70 78 78 78 48*    Iron Studies:  No results found for this basename: IRON, TIBC, TRANSFERRIN, FERRITIN,  in the last 72 hours Studies/Results: No results found. Marland Kitchen antiseptic oral rinse  15 mL Mouth Rinse QID  . bisacodyl  10 mg Rectal Once  . chlorhexidine  15 mL Mouth Rinse BID  . collagenase   Topical Daily  . pantoprazole (PROTONIX) IV  40 mg Intravenous Q12H  . sucralfate  1 g Oral TID WC & HS  . thiamine  100 mg Intravenous Daily    BMET    Component Value Date/Time   NA 133* 09/16/2013 0500   K 3.9 09/16/2013 0500   CL 102 09/16/2013 0500   CO2 21 09/16/2013 0500   GLUCOSE 82 09/16/2013 0500   BUN 19 09/16/2013 0500   CREATININE 1.62* 09/16/2013 0500   CALCIUM 7.1* 09/16/2013 0500   GFRNONAA 46* 09/16/2013 0500   GFRAA 53* 09/16/2013 0500   CBC    Component Value Date/Time   WBC 3.4* 09/16/2013 0500   RBC 2.72* 09/16/2013 0500   HGB 8.5* 09/16/2013 0500   HCT 25.1* 09/16/2013 0500   PLT 118* 09/16/2013 0500   MCV 92.3 09/16/2013 0500   MCH 31.3 09/16/2013 0500   MCHC 33.9 09/16/2013 0500   RDW 15.5 09/16/2013 0500   LYMPHSABS 0.6* 09/14/2013 0455   MONOABS 0.3 09/14/2013 0455   EOSABS 0.0 09/14/2013 0455   BASOSABS 0.0 09/14/2013 0455     Assessment/Plan:  1. AKI in the setting of SIRS, NSAIDs and ACE and had required CRRT- now recovering renal function with improving  Scr without HD  1. Off of pressors with marked increase in UOP and improved Scr off of RRT 2. Appears to be volume depleted and would continue to try to keep him even to positive on the fluid balance in order to stabilize renal function 2. VDRF- extubated yesterday and doing well. Plan per PCCM 3. Hypokalemia- repleted 4. Metabolic acidosis- improved 5. PVD/gangrene s/p RAKA- on clinda/zosyn/vanc, follow vanc trough levels 6. ABLA- cont to follow, on aranesp 7. HTN/Volume- stable, no evidence of volume overload 8. Protein malnutrition- unable to advance tube feeds. 9. Dispo- per PCCM 10. Will sign off as we have nothing further to add.  No follow up needed and ok to D/c trialysis catheter  Gayathri Futrell A
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
## 2013-09-16 NOTE — Progress Notes (Signed)
[facility] Progress Note  09/16/2013 8:39 AM 7 Days Post-Op  Subjective:  Doing well. Some pain but well controlled.   Tmax 98.6 BP sys 130s-150s Pulse 80s-90s O2 100% RA  Filed Vitals:   09/16/13 0800  BP: 137/85  Pulse: 96  Temp: 98.6 F (37 C)  Resp: 18    Physical Exam: Incisions:  Right AKA incision healing well. No hematoma. In stockinette.  Extremities: mobilizing stump well.   CBC    Component Value Date/Time   WBC 3.4* 09/16/2013 0500   RBC 2.72* 09/16/2013 0500   HGB 8.5* 09/16/2013 0500   HCT 25.1* 09/16/2013 0500   PLT 118* 09/16/2013 0500   MCV 92.3 09/16/2013 0500   MCH 31.3 09/16/2013 0500   MCHC 33.9 09/16/2013 0500   RDW 15.5 09/16/2013 0500   LYMPHSABS 0.6* 09/14/2013 0455   MONOABS 0.3 09/14/2013 0455   EOSABS 0.0 09/14/2013 0455   BASOSABS 0.0 09/14/2013 0455    BMET    Component Value Date/Time   NA 133* 09/16/2013 0500   K 3.9 09/16/2013 0500   CL 102 09/16/2013 0500   CO2 21 09/16/2013 0500   GLUCOSE 82 09/16/2013 0500   BUN 19 09/16/2013 0500   CREATININE 1.62* 09/16/2013 0500   CALCIUM 7.1* 09/16/2013 0500   GFRNONAA 46* 09/16/2013 0500   GFRAA 53* 09/16/2013 0500    INR    Component Value Date/Time   INR 1.37 09/09/2013 0339     Intake/Output Summary (Last 24 hours) at 09/16/13 0839 Last data filed at 09/16/13 0700  Gross per 24 hour  Intake   2640 ml  Output   1600 ml  Net   1040 ml     Assessment:  58 y.o. male is s/p: right AKA 7 Days Post-Op  Plan: -Pt continuing to do well -consult to inpatient rehab    Virgina Jock, Student PA [facility] [phone] 09/16/2013 8:39 AM

## 2013-09-16 NOTE — Progress Notes (Addendum)
NUTRITION CONSULT/FOLLOW UP  DOCUMENTATION CODES  Per approved criteria   -Severe malnutrition in the context of chronic illness  -Underweight    Intervention:   Ensure Complete po BID, each supplement provides 350 kcal and 13 grams of protein RD to follow for nutrition care plan  Nutrition Dx:   Inadequate oral intake, resolved  New Nutrition Dx: Increased nutrient needs related to wound healing, malnutrition as evidenced by estimated nutrition needs, ongoing  Goal:   Intake to meet >90% of estimated nutrition needs, progressing  Monitor:   PO & supplemental intake, weight, labs, I/O's  Assessment:   58 year old male w/ severe PAD. Admitted on 2/12 w/ severe sepsis/ septic shock, acute renal failure, metabolic acidosis and hyperkalemia in setting of gangrenous non-healing RLE ulcer. S/P right AKA on 2/13.  Patient extubated 2/17. Transferred to 2C-Stepdown from 8M-Medical ICU.  Patient reports he's appetite is pretty good.  Currently on Clear Liquids.  States he's ready for solids.  Partial SBO resolving.  PO intake 100% per flowsheet records.  Energy & protein needs increased given post-op wound healing.  Would benefit from addition of nutrition supplements -- will order.  Nephrology signing off; ok to discontinue trialysis catheter.  Height: Ht Readings from Last 1 Encounters:  09/16/13 5\' 9"  (1.753 m)    Weight Status:   Wt Readings from Last 1 Encounters:  09/16/13 134 lb 4.2 oz (60.9 kg)    Re-estimated needs:  Kcal: 1800-2000 Protein: 105-115 hm Fluid: 1.8-2.0 L  Skin: unstageable pressure ulcer to left thigh; stage 3 pressure ulcer to sacrum, unstageable pressure ulcer to right buttock, unstageable pressure ulcer right knee, unstageable pressure ulcer left knee, unstageable pressure ulcer sacrum, right leg BKA  Diet Order: Full Liquids   Intake/Output Summary (Last 24 hours) at 09/16/13 1423 Last data filed at 09/16/13 1100  Gross per 24 hour  Intake    1760 ml  Output   1900 ml  Net   -140 ml   Labs:   Recent Labs Lab 09/12/13 1641 09/13/13 0400 09/13/13 0654 09/14/13 0455 09/15/13 0615 09/15/13 1207 09/16/13 0500  NA 135* 136*  --  139 140 132* 133*  K 3.9 3.5*  --  3.0* 2.3* 3.6* 3.9  CL 99 100  --  102 112 99 102  CO2 24 25  --  25 17* 20 21  BUN 17 13  --  19 17 20 19   CREATININE 1.14 0.98  --  1.59* 1.40* 1.71* 1.62*  CALCIUM 7.6* 7.5*  --  7.7* 5.5* 7.1* 7.1*  MG 2.1  --  2.3 2.2  --   --   --   PHOS 1.9* 1.6*  --  3.3  --   --  2.4  GLUCOSE 90 114*  --  104* 95 97 82    CBG (last 3)   Recent Labs  09/16/13 0417 09/16/13 0759 09/16/13 1119  GLUCAP 78 78 48*    Scheduled Meds: . antiseptic oral rinse  15 mL Mouth Rinse QID  . bisacodyl  10 mg Rectal Once  . chlorhexidine  15 mL Mouth Rinse BID  . collagenase   Topical Daily  . pantoprazole (PROTONIX) IV  40 mg Intravenous Q12H  . sucralfate  1 g Oral TID WC & HS  . thiamine  100 mg Intravenous Daily    Continuous Infusions: . sodium chloride 100 mL/hr at 09/16/13 0900  . dextrose 50 mL/hr at 09/14/13 1333  .  sodium bicarbonate 150 mEq  in sterile water 1000 mL infusion 150 mEq (09/16/13 1158)    Arthur Holms, RD, LDN Pager #: [phone] After-Hours Pager #: [phone]

## 2013-09-16 NOTE — Progress Notes (Addendum)
Progress Note [place] TEAM 1 - Stepdown/ICU TEAM   Edwin [name] [id number] DOB: 1956-06-04 DOA: 09/07/2013 PCP: Webb Silversmith, NP  Brief [id number] man with severe PAD who was admitted on 2/12 with septic shock, ARF, metabolic acidosis and a non healing gangrenous RLE ulcer.  He has had a complicated medical course since admit including an endoscopy/flex sig showing no active bleeding and multiple shallow gastric ulcers.  There had been concern for GI Bleeding and he was noted to have a drop in Hgb on 2/19 for which he received 1 unit of PRBC.  He underwent a Rt AKA on 2/13. Partial SBO vs. Ileus was noted on CT Abdomen 2/16.  He also underwent a TTE on 2/17 showing grade 1 diastolic dysfunction.  He was treated with ABx for his sepsis with Vancomycin, Zosyn, and Clindamycin.  He was successfully extubated on 2/17.   SIGNIFICANT EVENTS / STUDIES:  2/12 R tib/fib XR >>> No osteo  2/12 R ankle >>> No acute process  2/12 Endoscopy / flex sig >>> No active bleed, large gastric residual, multiple shallow gastric ulcers without hemorrhage. Suspected nonsteroidal gastropathy vs gastric mucosal ischemia.  2/12 C.diff neg, stool culture neg  2/13 R AKA, remained intubated after surgery  2/16-remained vented, shock, concern SBO  2/16 ct abdo>>>Multiple dilated small bowel loops without definite transition point. Minimal free fluid. Air-fluid throughout the colon. Ileus versus early/partial distal small bowel obstruction .  2/17 Echo: EF 55% to 60%. Diastolic 1 dysfunction [id number] extubated  HPI/Subjective: C/o pain at amputation site which is poorly controlled.  Denies cp, f/c, n/v, abdom pain, or sob.   Assessment/Plan:  Gangrene of R lower extremity with concomitant septic shock, metabolic acidosis - WBC trending down - Antibiotics completed as of 2/18 - Vascular following, no new recommendations per most recent note - will need staples removed in approximately 2 weeks at 3 weeks  postop  Anemia, due to GIB + acute illness + acute kidney failure  - Follow closely, transfuse as needed.   - IV protonix - If continues to bleed, may need repeat consultation with G  Thrombocytopenia - HIT panel negative - SCDs  Acute respiratory failure due to COPD and above - Extubated successfully - Albuterol PRN - IS - O2 as needed  Partial SBO/Ileus - Resolving, multiple BM 2/18, one with blood - Protonix BID for ulcers as noted on EGD - advancing diet as able    Acute renal failure - Nephrology is following along, recommend + fluid balance - Required CRT but has been off since 2/17 - crt improved today   Hyperkalemia > Hypokalemia  - K+ balanced today - follow   Decubitus ulcer of sacral region, unstageable - WOC consult   Protein-calorie malnutrition, severe - hypoglycemia intermittently - follow - cont Dextrose via IV as diet advanced - nutrition consult  HTN, Afib with RVR, PVD, CAD - well compensated at present   Consultants: PCCM Vascular Nephrology PMR  Antibiotics: Vancomycin 2/12 >>>2/17  Zosyn 2/12 >>> 2/18  Clindamycin 2/12 >>> 2/16  Code Status: FULL Family Communication: no family present at time of exam  Disposition Plan: CIR recommended, but CIR feels SNF more appropriate, will need coordination with SW  Objective: Filed Vitals:   09/16/13 0400 09/16/13 0600 09/16/13 0800 09/16/13 1120  BP: 146/89 137/81 137/85 139/89  Pulse: 94 92 96 97  Temp: 97.6 F (36.4 C)  98.6 F (37 C) 98.1 F (36.7 C)  TempSrc: Oral  Oral Oral  Resp: 24 24 18 18   Height:   5\' 9"  (1.753 m)   Weight:   60.9 kg (134 lb 4.2 oz)   SpO2: 99% 99% 100% 99%    Intake/Output Summary (Last 24 hours) at 09/16/13 1417 Last data filed at 09/16/13 1100  Gross per 24 hour  Intake   1760 ml  Output   1900 ml  Net   -140 ml   Exam:`  General:  Pt is alert, follows commands appropriately, not in acute distress, thin.  HD catheter in place in [place].  CVL in place  in LIJ Cardiovascular: Regular rate and rhythm, S1/S2 Respiratory: poor air movement B bases - no wheeze  Abdomen: Soft, non tender, non distended, bowel sounds present Extremities: No edema in left LE, + bruises vs. Necrotic skin on multiple toes, wound on heal with bandage covering.  RLE s/p AKA with stocking in place.  Neuro: nonfocal  Data Reviewed: Basic Metabolic Panel:  Recent Labs Lab 09/11/13 1700 09/12/13 0350 09/12/13 1641 09/13/13 0400 09/13/13 0654 09/14/13 0455 09/15/13 0615 09/15/13 1207 09/16/13 0500  NA  --  135* 135* 136*  --  139 140 132* 133*  K  --  4.0 3.9 3.5*  --  3.0* 2.3* 3.6* 3.9  CL  --  100 99 100  --  102 112 99 102  CO2  --  25 24 25   --  25 17* 20 21  GLUCOSE  --  116* 90 114*  --  104* 95 97 82  BUN  --  20 17 13   --  19 17 20 19   CREATININE  --  1.08 1.14 0.98  --  1.59* 1.40* 1.71* 1.62*  CALCIUM  --  7.9* 7.6* 7.5*  --  7.7* 5.5* 7.1* 7.1*  MG 2.3 2.3 2.1  --  2.3 2.2  --   --   --   PHOS  --  1.8* 1.9* 1.6*  --  3.3  --   --  2.4   Liver Function Tests:  Recent Labs Lab 09/12/13 0350 09/12/13 1641 09/13/13 0400 09/14/13 0455 09/16/13 0500  AST  --   --  19 19  --   ALT  --   --  16 14  --   ALKPHOS  --   --  84 116  --   BILITOT  --   --  0.3 0.3  --   PROT  --   --  5.0* 5.0*  --   ALBUMIN 1.6* 1.4* 1.4*  1.3* 1.4* 1.2*   CBC:  Recent Labs Lab 09/11/13 0412 09/13/13 0400 09/13/13 1849 09/14/13 0455 09/15/13 0615 09/15/13 0820 09/16/13 0500  WBC 11.1* 6.2  --  4.7 3.6*  --  3.4*  NEUTROABS  --   --   --  3.7  --   --   --   HGB 9.5* 9.2* 8.9* 8.9* 6.7* 8.4* 8.5*  HCT 26.8* 26.3* 26.1* 26.2* 20.2* 24.7* 25.1*  MCV 89.0 91.6  --  92.6 92.7  --  92.3  PLT 123* 61*  --  75* 74*  --  118*   CBG:  Recent Labs Lab 09/15/13 2114 09/16/13 0012 09/16/13 0417 09/16/13 0759 09/16/13 1119  GLUCAP 70 78 78 78 48*    Recent Results (from the past 240 hour(s))  CULTURE, BLOOD (ROUTINE X 2)     Status: None    Collection Time    09/07/13  9:39 PM      Result  Value Ref Range Status   Specimen Description BLOOD ARM RIGHT   Final   Special Requests BOTTLES DRAWN AEROBIC AND ANAEROBIC B 10CC R 5CC   Final   Culture  Setup Time     Final   Value: 09/08/2013 05:04     Performed at [facility]   Culture     Final   Value: NO GROWTH 5 DAYS     Performed at [facility]   Report Status 09/14/2013 FINAL   Final  CULTURE, BLOOD (ROUTINE X 2)     Status: None   Collection Time    09/08/13 12:20 AM      Result Value Ref Range Status   Specimen Description BLOOD LEFT ARM   Final   Special Requests BOTTLES DRAWN AEROBIC ONLY 10CC   Final   Culture  Setup Time     Final   Value: 09/08/2013 05:05     Performed at [facility]   Culture     Final   Value: NO GROWTH 5 DAYS     Performed at [facility]   Report Status 09/14/2013 FINAL   Final  URINE CULTURE     Status: None   Collection Time    09/08/13  1:10 AM      Result Value Ref Range Status   Specimen Description URINE, CATHETERIZED   Final   Special Requests NONE   Final   Culture  Setup Time     Final   Value: 09/08/2013 05:42     Performed at [place]     Final   Value: NO GROWTH     Performed at [facility]   Culture     Final   Value: NO GROWTH     Performed at [facility]   Report Status 09/09/2013 FINAL   Final  MRSA PCR SCREENING     Status: None   Collection Time    09/08/13  1:10 AM      Result Value Ref Range Status   MRSA by PCR NEGATIVE  NEGATIVE Final   Comment:            The GeneXpert MRSA Assay (FDA     approved for NASAL specimens     only), is one component of a     comprehensive MRSA colonization     surveillance program. It is not     intended to diagnose MRSA     infection nor to guide or     monitor treatment for     MRSA infections.  STOOL CULTURE     Status: None   Collection Time    09/08/13  2:55 PM      Result Value Ref  Range Status   Specimen Description PERIRECTAL   Final   Special Requests Normal   Final   Culture     Final   Value: NO SALMONELLA, SHIGELLA, CAMPYLOBACTER, YERSINIA, OR E.COLI 0157:H7 ISOLATED     Performed at [facility]   Report Status 09/12/2013 FINAL   Final  CLOSTRIDIUM DIFFICILE BY PCR     Status: None   Collection Time    09/08/13  2:55 PM      Result Value Ref Range Status   C difficile by pcr NEGATIVE  NEGATIVE Final     Scheduled Meds: . antiseptic oral rinse  15 mL Mouth Rinse QID  . bisacodyl  10 mg Rectal Once  .  chlorhexidine  15 mL Mouth Rinse BID  . collagenase   Topical Daily  . pantoprazole (PROTONIX) IV  40 mg Intravenous Q12H  . sucralfate  1 g Oral TID WC & HS  . thiamine  100 mg Intravenous Daily     Cherene Altes, MD [facility] For Consults/Admissions > Flow Manager - [phone] Office  [phone] Pager [phone]  On-Call/Text Page:      [email or web address]      password [facility]  09/16/2013, 2:17 PM   LOS: 9 days

## 2013-09-16 NOTE — Progress Notes (Signed)
Clinical Social Work Department CLINICAL SOCIAL WORK PLACEMENT NOTE 09/16/2013  Patient:  Edwin [name], Edwin [name]  Account Number:  [id number] Admit date:  09/07/2013  Clinical Social Worker:  Ky Barban, Latanya Presser  Date/time:  09/16/2013 01:21 PM  Clinical Social Work is seeking post-discharge placement for this patient at the following level of care:   SKILLED NURSING   (*CSW will update this form in Epic as items are completed)   N/A-clinicals sent to SNFs that accept LOGs and facilities within 30-mile radius of hospital to find placement  Patient/family provided with [place] Department of Clinical Social Work's list of facilities offering this level of care within the geographic area requested by the patient (or if unable, by the patient's family).  09/16/2013  Patient/family informed of their freedom to choose among providers that offer the needed level of care, that participate in Medicare, Medicaid or managed care program needed by the patient, have an available bed and are willing to accept the patient.  N/A-clinicals sent to this and each SNF within 30-mile radius of hospital to find placement for pt. Pt will be informed of ownership in [facility] if this is a placement option for pt  Patient/family informed of [facility]' ownership interest in [facility], as well as of the fact that they are under no obligation to receive care at this facility.  PASARR submitted to [employer]DS on 09/16/2013 PASARR number received from [employer] on 09/16/2013  FL2 transmitted to all facilities in geographic area requested by pt/family on  09/16/2013 FL2 transmitted to all facilities within larger geographic area on 09/16/2013  Patient informed that his/her managed care company has contracts with or will negotiate with  certain facilities, including the following:     Patient/family informed of bed offers received:   Patient chooses bed at  Physician recommends and patient chooses bed at    Patient to  be transferred to  on   Patient to be transferred to facility by   The following physician request were entered in Epic:   Additional Comments:   Ky Barban, MSW, [place] Social Worker [phone]
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
## 2013-09-16 NOTE — Progress Notes (Signed)
I have examined the patient, reviewed and agree with above. Doing well overall. Good spirits this morning. Right above-knee amputation healing. Will not follow actively. Will need staples removed in approximately 2 weeks at 3 weeks postop.  Zaidin Blyden, MD 09/16/2013 10:46 AM

## 2013-09-16 NOTE — Telephone Encounter (Addendum)
Message copied by Gena Fray on Fri Sep 16, 2013 11:58 AM ------      Message from: Peter Minium K      Created: Thu Sep 15, 2013  3:19 PM      Regarding: Schedule                   ----- Message -----         From: Gabriel Earing, PA-C         Sent: 09/15/2013   3:09 PM           To: [facility] Charge Pool            S/p right BKA 09/09/13.  F/u with Dr. Donnetta Hutching in 4 weeks.  Will need staples removed.            Thanks,      Samantha ------  09/16/13: lm for pt re appt, dpm

## 2013-09-16 NOTE — Progress Notes (Signed)
Physical Therapy Wound Treatment Patient Details  Name: Edwin [name] MRN: [id number] Date of Birth: [birth year]  Today's Date: 09/16/2013 Time: 4098-1191 Time Calculation (min): 24 min  Subjective  Subjective: Pt asking how much longer he will need hydrotherapy.  Pain Score: Pain Score: 7 Pt premedicated.  Wound Assessment  Pressure Ulcer 09/08/13 Unstageable - Full thickness tissue loss in which the base of the ulcer is covered by slough (yellow, tan, gray, green or brown) and/or eschar (tan, brown or black) in the wound bed. unstageable wound under left buttock and thigh (Active)  Dressing Type Moist to dry;Foam;Barrier Film (skin prep) 09/16/2013 11:32 AM  Dressing Changed 09/16/2013 11:32 AM  Dressing Change Frequency Daily 09/16/2013 11:32 AM  State of Healing Early/partial granulation 09/16/2013 11:32 AM  Site / Wound Assessment Yellow;Brown;Pink 09/16/2013 11:32 AM  % Wound base Red or Granulating 30% 09/16/2013 11:32 AM  % Wound base Yellow 65% 09/16/2013 11:32 AM  % Wound base Black 5% 09/16/2013 11:32 AM  % Wound base Other (Comment) 0% 09/16/2013 11:32 AM  Peri-wound Assessment Intact 09/16/2013 11:32 AM  Wound Length (cm) 9 cm 09/13/2013  9:45 AM  Wound Width (cm) 6 cm 09/13/2013  9:45 AM  Wound Depth (cm) 0 cm 09/13/2013  9:45 AM  Drainage Amount Minimal 09/16/2013 11:32 AM  Drainage Description Serous 09/16/2013 11:32 AM  Treatment Debridement (Selective);Hydrotherapy (Pulse lavage);Packing (Saline gauze) 09/16/2013 11:32 AM   Hydrotherapy Pulsed lavage therapy - wound location: Lt ischium Pulsed Lavage with Suction (psi): 8 psi Pulsed Lavage with Suction - Normal Saline Used: 1000 mL Pulsed Lavage Tip: Tip with splash shield Selective Debridement Selective Debridement - Location: lt ischium Selective Debridement - Tools Used: Forceps;Scissors Selective Debridement - Tissue Removed: brown and yellow necrotic tissue   Wound Assessment and Plan  Wound Therapy -  Assess/Plan/Recommendations Wound Therapy - Clinical Statement: Wound improving and eschar being removed. Spoke with [facility] and will decr hydrotherapy to 3x/wk. Hydrotherapy Plan: Debridement;Dressing change;Patient/family education;Pulsatile lavage with suction Wound Therapy - Frequency: 3X / week Wound Therapy - Follow Up Recommendations: Skilled nursing facility Wound Plan: See above  Wound Therapy Goals- Improve the function of patient's integumentary system by progressing the wound(s) through the phases of wound healing (inflammation - proliferation - remodeling) by: Decrease Necrotic Tissue to: 25 Decrease Necrotic Tissue - Progress: Progressing toward goal Increase Granulation Tissue to: 75 Increase Granulation Tissue - Progress: Progressing toward goal  Goals will be updated until maximal potential achieved or discharge criteria met.  Discharge criteria: when goals achieved, discharge from hospital, MD decision/surgical intervention, no progress towards goals, refusal/missing three consecutive treatments without notification or medical reason.  GP     Bri Wakeman 09/16/2013, 11:37 AM  [name] PT [phone]

## 2013-09-17 LAB — CBC
HCT: 25.7 % — ABNORMAL LOW (ref 39.0–52.0)
Hemoglobin: 8.6 g/dL — ABNORMAL LOW (ref 13.0–17.0)
MCH: 31 pg (ref 26.0–34.0)
MCHC: 33.5 g/dL (ref 30.0–36.0)
MCV: 92.8 fL (ref 78.0–100.0)
Platelets: 170 10*3/uL (ref 150–400)
RBC: 2.77 MIL/uL — AB (ref 4.22–5.81)
RDW: 15.2 % (ref 11.5–15.5)
WBC: 3.8 10*3/uL — AB (ref 4.0–10.5)

## 2013-09-17 LAB — BASIC METABOLIC PANEL
BUN: 17 mg/dL (ref 6–23)
CALCIUM: 6.8 mg/dL — AB (ref 8.4–10.5)
CO2: 23 mEq/L (ref 19–32)
CREATININE: 1.47 mg/dL — AB (ref 0.50–1.35)
Chloride: 99 mEq/L (ref 96–112)
GFR calc Af Amer: 60 mL/min — ABNORMAL LOW (ref >90)
GFR, EST NON AFRICAN AMERICAN: 52 mL/min — AB (ref >90)
Glucose, Bld: 106 mg/dL — ABNORMAL HIGH (ref 70–99)
Potassium: 4 mEq/L (ref 3.7–5.3)
SODIUM: 132 meq/L — AB (ref 137–147)

## 2013-09-17 LAB — GLUCOSE, CAPILLARY
GLUCOSE-CAPILLARY: 99 mg/dL (ref 70–99)
Glucose-Capillary: 103 mg/dL — ABNORMAL HIGH (ref 70–99)
Glucose-Capillary: 21 mg/dL — CL (ref 70–99)
Glucose-Capillary: 68 mg/dL — ABNORMAL LOW (ref 70–99)
Glucose-Capillary: 90 mg/dL (ref 70–99)

## 2013-09-17 MED ORDER — ZOLPIDEM TARTRATE 5 MG PO TABS
5.0000 mg | ORAL_TABLET | Freq: Every evening | ORAL | Status: DC | PRN
  Administered 2013-09-17 – 2013-09-20 (×4): 10 mg via ORAL
  Filled 2013-09-17 (×4): qty 2

## 2013-09-17 MED ORDER — FOLIC ACID 1 MG PO TABS
1.0000 mg | ORAL_TABLET | Freq: Every day | ORAL | Status: DC
  Administered 2013-09-17 – 2013-09-21 (×5): 1 mg via ORAL
  Filled 2013-09-17 (×5): qty 1

## 2013-09-17 MED ORDER — DEXTROSE-NACL 5-0.9 % IV SOLN
INTRAVENOUS | Status: DC
  Administered 2013-09-17 – 2013-09-18 (×2): via INTRAVENOUS
  Administered 2013-09-18: 1000 mL via INTRAVENOUS
  Administered 2013-09-18: 75 mL via INTRAVENOUS
  Administered 2013-09-19 (×2): via INTRAVENOUS

## 2013-09-17 MED ORDER — PANTOPRAZOLE SODIUM 40 MG PO TBEC
40.0000 mg | DELAYED_RELEASE_TABLET | Freq: Two times a day (BID) | ORAL | Status: DC
  Administered 2013-09-17 – 2013-09-21 (×8): 40 mg via ORAL
  Filled 2013-09-17 (×7): qty 1

## 2013-09-17 MED ORDER — VITAMIN B-1 100 MG PO TABS
100.0000 mg | ORAL_TABLET | Freq: Every day | ORAL | Status: DC
  Administered 2013-09-17 – 2013-09-21 (×5): 100 mg via ORAL
  Filled 2013-09-17 (×5): qty 1

## 2013-09-17 NOTE — Progress Notes (Addendum)
Progress Note [place] TEAM 1 - Stepdown/ICU TEAM   Edwin [name] [id number] DOB: Jul 21, 1956 DOA: 09/07/2013 PCP: Webb Silversmith, NP  Brief [id number] man with severe PAD who was admitted on 2/12 with septic shock, ARF, metabolic acidosis and a non healing gangrenous RLE ulcer.  He has had a complicated medical course since admit including an endoscopy/flex sig showing no active bleeding and multiple shallow gastric ulcers.  There had been concern for GI Bleeding and he was noted to have a drop in Hgb on 2/19 for which he received 1 unit of PRBC.  He underwent a Rt AKA on 2/13. Partial SBO vs. Ileus was noted on CT Abdomen 2/16.  He also underwent a TTE on 2/17 showing grade 1 diastolic dysfunction.  He was treated with ABx for his sepsis with Vancomycin, Zosyn, and Clindamycin.  He was successfully extubated on 2/17.   SIGNIFICANT EVENTS / STUDIES:  2/12 R tib/fib XR >>> No osteo  2/12 R ankle >>> No acute process  2/12 Endoscopy / flex sig >>> No active bleed, large gastric residual, multiple shallow gastric ulcers without hemorrhage. Suspected nonsteroidal gastropathy vs gastric mucosal ischemia.  2/12 C.diff neg, stool culture neg  2/13 R AKA, remained intubated after surgery  2/16-remained vented, shock, concern SBO  2/16 ct abdo>>>Multiple dilated small bowel loops without definite transition point. Minimal free fluid. Air-fluid throughout the colon. Ileus versus early/partial distal small bowel obstruction .  2/17 Echo: EF 55% to 60%. Diastolic 1 dysfunction [id number] extubated  HPI/Subjective: States pain is currently well controlled.  Anxious to begin getting up out of bed.  Denies cp, f/c, n/v, abdom pain, or sob.   Assessment/Plan:  Gangrene of R lower extremity with concomitant septic shock, metabolic acidosis - Antibiotics completed as of 2/18 - Vascular following - will need staples removed in approximately 2 weeks at 3 weeks postop  Anemia, due to GIB + acute  illness + acute kidney failure  - Follow closely, transfuse as needed  - protonix  Thrombocytopenia - HIT panel negative - Plts improving  - SCDs  Acute respiratory failure due to COPD and above - Extubated successfully - Albuterol PRN - IS - O2 as needed  Partial SBO/Ileus - Resolving - slowly advancing diet  - Protonix BID for ulcers as noted on EGD   Acute renal failure - Nephrology was following along - Required CRT but has been off since 2/17 - crt improved further today   Hyperkalemia > Hypokalemia  - K+ balanced today - follow   Decubitus ulcer of sacral region, unstageable - WOC consult   Protein-calorie malnutrition, severe in the context of chronic illness - Underweight Body mass index is 19.82 kg/(m^2). - hypoglycemia intermittently - follow - cont Dextrose via IV as diet advanced - nutrition consult - advance diet   HTN, Afib with RVR, PVD, CAD - well compensated at present   Consultants: PCCM Vascular Nephrology PMR  Antibiotics: Vancomycin 2/12 >>>2/17  Zosyn 2/12 >>> 2/18  Clindamycin 2/12 >>> 2/16  Code Status: FULL Family Communication: no family present at time of exam  Disposition Plan: CIR recommended, but CIR feels SNF more appropriate, will need coordination with SW  Objective: Filed Vitals:   09/17/13 1000 09/17/13 1100 09/17/13 1200 09/17/13 1628  BP: 136/84 128/112 117/85 131/82  Pulse: 108 103 105 103  Temp:  98.4 F (36.9 C)  98 F (36.7 C)  TempSrc:  Oral  Oral  Resp: 28 25 22 19   Height:  Weight:      SpO2: 99% 100% 99% 100%    Intake/Output Summary (Last 24 hours) at 09/17/13 1715 Last data filed at 09/17/13 1630  Gross per 24 hour  Intake   2200 ml  Output   2651 ml  Net   -451 ml   Exam:`  General:  Pt is alert and not in acute distress, thin.  HD catheter in place in [place].  CVL in place in [facility] Cardiovascular: Regular rate and rhythm, S1/S2 Respiratory: poor air movement B bases - no wheeze  Abdomen:  Soft, non tender, non distended, bowel sounds present Extremities: No edema in left LE, + bruises vs. Necrotic skin on multiple toes, wound on heal with bandage covering.  RLE s/p AKA with stocking in place.   Data Reviewed: Basic Metabolic Panel:  Recent Labs Lab 09/11/13 1700 09/12/13 0350 09/12/13 1641 09/13/13 0400 09/13/13 0654 09/14/13 0455 09/15/13 0615 09/15/13 1207 09/16/13 0500 09/17/13 0617  NA  --  135* 135* 136*  --  139 140 132* 133* 132*  K  --  4.0 3.9 3.5*  --  3.0* 2.3* 3.6* 3.9 4.0  CL  --  100 99 100  --  102 112 99 102 99  CO2  --  25 24 25   --  25 17* 20 21 23   GLUCOSE  --  116* 90 114*  --  104* 95 97 82 106*  BUN  --  20 17 13   --  19 17 20 19 17   CREATININE  --  1.08 1.14 0.98  --  1.59* 1.40* 1.71* 1.62* 1.47*  CALCIUM  --  7.9* 7.6* 7.5*  --  7.7* 5.5* 7.1* 7.1* 6.8*  MG 2.3 2.3 2.1  --  2.3 2.2  --   --   --   --   PHOS  --  1.8* 1.9* 1.6*  --  3.3  --   --  2.4  --    Liver Function Tests:  Recent Labs Lab 09/12/13 0350 09/12/13 1641 09/13/13 0400 09/14/13 0455 09/16/13 0500  AST  --   --  19 19  --   ALT  --   --  16 14  --   ALKPHOS  --   --  84 116  --   BILITOT  --   --  0.3 0.3  --   PROT  --   --  5.0* 5.0*  --   ALBUMIN 1.6* 1.4* 1.4*  1.3* 1.4* 1.2*   CBC:  Recent Labs Lab 09/13/13 0400  09/14/13 0455 09/15/13 0615 09/15/13 0820 09/16/13 0500 09/17/13 0617  WBC 6.2  --  4.7 3.6*  --  3.4* 3.8*  NEUTROABS  --   --  3.7  --   --   --   --   HGB 9.2*  < > 8.9* 6.7* 8.4* 8.5* 8.6*  HCT 26.3*  < > 26.2* 20.2* 24.7* 25.1* 25.7*  MCV 91.6  --  92.6 92.7  --  92.3 92.8  PLT 61*  --  75* 74*  --  118* 170  < > = values in this interval not displayed. CBG:  Recent Labs Lab 09/17/13 0036 09/17/13 1156 09/17/13 1632 09/17/13 1634 09/17/13 1637  GLUCAP 99 103* 21* 21* 68*    Recent Results (from the past 240 hour(s))  CULTURE, BLOOD (ROUTINE X 2)     Status: None   Collection Time    09/07/13  9:39 PM  Result  Value Ref Range Status   Specimen Description BLOOD ARM RIGHT   Final   Special Requests BOTTLES DRAWN AEROBIC AND ANAEROBIC B 10CC R 5CC   Final   Culture  Setup Time     Final   Value: 09/08/2013 05:04     Performed at [facility]   Culture     Final   Value: NO GROWTH 5 DAYS     Performed at [facility]   Report Status 09/14/2013 FINAL   Final  CULTURE, BLOOD (ROUTINE X 2)     Status: None   Collection Time    09/08/13 12:20 AM      Result Value Ref Range Status   Specimen Description BLOOD LEFT ARM   Final   Special Requests BOTTLES DRAWN AEROBIC ONLY 10CC   Final   Culture  Setup Time     Final   Value: 09/08/2013 05:05     Performed at [facility]   Culture     Final   Value: NO GROWTH 5 DAYS     Performed at [facility]   Report Status 09/14/2013 FINAL   Final  URINE CULTURE     Status: None   Collection Time    09/08/13  1:10 AM      Result Value Ref Range Status   Specimen Description URINE, CATHETERIZED   Final   Special Requests NONE   Final   Culture  Setup Time     Final   Value: 09/08/2013 05:42     Performed at [place]     Final   Value: NO GROWTH     Performed at [facility]   Culture     Final   Value: NO GROWTH     Performed at [facility]   Report Status 09/09/2013 FINAL   Final  MRSA PCR SCREENING     Status: None   Collection Time    09/08/13  1:10 AM      Result Value Ref Range Status   MRSA by PCR NEGATIVE  NEGATIVE Final   Comment:            The GeneXpert MRSA Assay (FDA     approved for NASAL specimens     only), is one component of a     comprehensive MRSA colonization     surveillance program. It is not     intended to diagnose MRSA     infection nor to guide or     monitor treatment for     MRSA infections.  STOOL CULTURE     Status: None   Collection Time    09/08/13  2:55 PM      Result Value Ref Range Status   Specimen Description PERIRECTAL    Final   Special Requests Normal   Final   Culture     Final   Value: NO SALMONELLA, SHIGELLA, CAMPYLOBACTER, YERSINIA, OR E.COLI 0157:H7 ISOLATED     Performed at [facility]   Report Status 09/12/2013 FINAL   Final  CLOSTRIDIUM DIFFICILE BY PCR     Status: None   Collection Time    09/08/13  2:55 PM      Result Value Ref Range Status   C difficile by pcr NEGATIVE  NEGATIVE Final     Scheduled Meds: . antiseptic oral rinse  15 mL Mouth Rinse QID  . bisacodyl  10 mg  Rectal Once  . chlorhexidine  15 mL Mouth Rinse BID  . collagenase   Topical Daily  . feeding supplement (ENSURE COMPLETE)  237 mL Oral BID BM  . pantoprazole (PROTONIX) IV  40 mg Intravenous Q12H  . sucralfate  1 g Oral TID WC & HS  . thiamine  100 mg Intravenous Daily   35 mins spent   Cherene Altes, MD [facility] For Consults/Admissions > Flow Manager - [phone] Office  [phone] Pager [phone]  On-Call/Text Page:      [email or web address]      password [facility]  09/17/2013, 5:15 PM   LOS: 10 days

## 2013-09-17 NOTE — Progress Notes (Signed)
Hypoglycemic Event  CBG: 68  Treatment: OJ  Symptoms: None  Follow-up CBG: Time:1725 CBG Result:90  Possible Reasons for Event: Inadequate po intake  Comments/MD notified:DR MCClung    Edwin [name], Edwin [name]  Remember to initiate Hypoglycemia Order Set & complete

## 2013-09-18 LAB — GLUCOSE, CAPILLARY
GLUCOSE-CAPILLARY: 98 mg/dL (ref 70–99)
Glucose-Capillary: 107 mg/dL — ABNORMAL HIGH (ref 70–99)
Glucose-Capillary: 159 mg/dL — ABNORMAL HIGH (ref 70–99)
Glucose-Capillary: 99 mg/dL (ref 70–99)

## 2013-09-18 LAB — BASIC METABOLIC PANEL
BUN: 17 mg/dL (ref 6–23)
CALCIUM: 6.8 mg/dL — AB (ref 8.4–10.5)
CHLORIDE: 95 meq/L — AB (ref 96–112)
CO2: 24 mEq/L (ref 19–32)
Creatinine, Ser: 1.36 mg/dL — ABNORMAL HIGH (ref 0.50–1.35)
GFR calc Af Amer: 66 mL/min — ABNORMAL LOW (ref >90)
GFR calc non Af Amer: 57 mL/min — ABNORMAL LOW (ref >90)
GLUCOSE: 104 mg/dL — AB (ref 70–99)
Potassium: 4 mEq/L (ref 3.7–5.3)
Sodium: 127 mEq/L — ABNORMAL LOW (ref 137–147)

## 2013-09-18 NOTE — Progress Notes (Signed)
Progress Note  TEAM 1 - Stepdown/ICU TEAM   Edwin [name] [id number] DOB: Dec 31, 1955 DOA: 09/07/2013 PCP: Webb Silversmith, NP  Brief narrative: 89VE man with severe PAD who was admitted on 2/12 with septic shock, ARF, metabolic acidosis and a non healing gangrenous RLE ulcer.  He has had a complicated medical course since admit including an endoscopy/flex sig showing no active bleeding and multiple shallow gastric ulcers.  There had been concern for GI Bleeding and he was noted to have a drop in Hgb on 2/19 for which he received 1 unit of PRBC.  He underwent a Rt AKA on 2/13. Partial SBO vs. Ileus was noted on CT Abdomen 2/16.  He also underwent a TTE on 2/17 showing grade 1 diastolic dysfunction.  He was treated with ABx for his sepsis with Vancomycin, Zosyn, and Clindamycin.  He was successfully extubated on 2/17.   SIGNIFICANT EVENTS / STUDIES:  2/12 R tib/fib XR > No osteo  2/12 R ankle > No acute process  2/12 Endoscopy / flex sig > No active bleed, large gastric residual, multiple shallow gastric ulcers without hemorrhage. Suspected nonsteroidal gastropathy vs gastric mucosal ischemia.  2/12 C.diff neg, stool culture neg  2/13 R AKA, remained intubated after surgery  2/16 remained vented, shock, concern SBO  2/16 ct abdo > Multiple dilated small bowel loops without definite transition point. Minimal free fluid. Air-fluid throughout the colon. Ileus versus early/partial distal small bowel obstruction .  2/17 Echo: EF 55% to 60%. Diastolic 1 dysfunction 9/38 extubated  HPI/Subjective: Doing well today.  No new complaints.  Denies n/v, or abdom pain.  Tolerating diet w/o difficulty.    Assessment/Plan:  Gangrene of R lower extremity with concomitant septic shock, metabolic acidosis - Antibiotics completed as of 2/18 - Vascular following - will need staples removed in approximately 2 weeks at 3 weeks postop  Anemia, due to GIB + acute illness + acute kidney failure  -  Follow closely, transfuse as needed  - protonix  Thrombocytopenia - HIT panel negative - Plts improving  - SCDs  Acute respiratory failure due to COPD and above - Extubated successfully - Albuterol PRN - IS - O2 as needed  Partial SBO/Ileus - Resolved - tolerating regular diet  - Protonix BID for ulcers as noted on EGD   Acute renal failure - Nephrology was following along - Required CRT but has been off since 2/17 - crt improved further today   Hyperkalemia > Hypokalemia  - K+ balanced today - follow   Decubitus ulcer of sacral region, unstageable - WOC consult   Protein-calorie malnutrition, severe in the context of chronic illness - Underweight Body mass index is 20.6 kg/(m^2). - hypoglycemia intermittently - follow - cont Dextrose via IV as diet advanced - nutrition consult - advance diet   HTN, Afib with RVR, PVD, CAD - well compensated at present   Consultants: PCCM Vascular Nephrology PMR  Antibiotics: Vancomycin 2/12 >>>2/17  Zosyn 2/12 >>> 2/18  Clindamycin 2/12 >>> 2/16  Code Status: FULL Family Communication: no family present at time of exam  Disposition Plan: CIR recommended, but CIR feels SNF more appropriate, will need coordination with SW  Objective: Filed Vitals:   09/18/13 0400 09/18/13 0414 09/18/13 0600 09/18/13 0800  BP: 124/68 124/68 121/66 123/70  Pulse: 100 102 99 101  Temp:  98.3 F (36.8 C)  99 F (37.2 C)  TempSrc:  Oral  Oral  Resp: 19 28 11 21   Height:  Weight:  63.3 kg (139 lb 8.8 oz)    SpO2: 98% 96% 97% 100%    Intake/Output Summary (Last 24 hours) at 09/18/13 1044 Last data filed at 09/18/13 0900  Gross per 24 hour  Intake   2310 ml  Output   2100 ml  Net    210 ml   Exam: General:  Pt is alert and not in acute distress Cardiovascular: Regular rate and rhythm, S1/S2 Respiratory: poor air movement B bases - no wheeze  Abdomen: Soft, non tender, non distended, bowel sounds present Extremities: No edema in  left LE, RLE s/p AKA with stocking in place.   Data Reviewed: Basic Metabolic Panel:  Recent Labs Lab 09/11/13 1700 09/12/13 0350 09/12/13 1641 09/13/13 0400 09/13/13 0654 09/14/13 0455 09/15/13 0615 09/15/13 1207 09/16/13 0500 09/17/13 0617 09/18/13 0600  NA  --  135* 135* 136*  --  139 140 132* 133* 132* 127*  K  --  4.0 3.9 3.5*  --  3.0* 2.3* 3.6* 3.9 4.0 4.0  CL  --  100 99 100  --  102 112 99 102 99 95*  CO2  --  25 24 25   --  25 17* 20 21 23 24   GLUCOSE  --  116* 90 114*  --  104* 95 97 82 106* 104*  BUN  --  20 17 13   --  19 17 20 19 17 17   CREATININE  --  1.08 1.14 0.98  --  1.59* 1.40* 1.71* 1.62* 1.47* 1.36*  CALCIUM  --  7.9* 7.6* 7.5*  --  7.7* 5.5* 7.1* 7.1* 6.8* 6.8*  MG 2.3 2.3 2.1  --  2.3 2.2  --   --   --   --   --   PHOS  --  1.8* 1.9* 1.6*  --  3.3  --   --  2.4  --   --    Liver Function Tests:  Recent Labs Lab 09/12/13 0350 09/12/13 1641 09/13/13 0400 09/14/13 0455 09/16/13 0500  AST  --   --  19 19  --   ALT  --   --  16 14  --   ALKPHOS  --   --  84 116  --   BILITOT  --   --  0.3 0.3  --   PROT  --   --  5.0* 5.0*  --   ALBUMIN 1.6* 1.4* 1.4*  1.3* 1.4* 1.2*   CBC:  Recent Labs Lab 09/13/13 0400  09/14/13 0455 09/15/13 0615 09/15/13 0820 09/16/13 0500 09/17/13 0617  WBC 6.2  --  4.7 3.6*  --  3.4* 3.8*  NEUTROABS  --   --  3.7  --   --   --   --   HGB 9.2*  < > 8.9* 6.7* 8.4* 8.5* 8.6*  HCT 26.3*  < > 26.2* 20.2* 24.7* 25.1* 25.7*  MCV 91.6  --  92.6 92.7  --  92.3 92.8  PLT 61*  --  75* 74*  --  118* 170  < > = values in this interval not displayed. CBG:  Recent Labs Lab 09/17/13 1634 09/17/13 1637 09/17/13 1725 09/18/13 09/18/13 0757  GLUCAP 21* 68* 90 99 98    Recent Results (from the past 240 hour(s))  STOOL CULTURE     Status: None   Collection Time    09/08/13  2:55 PM      Result Value Ref Range Status   Specimen Description PERIRECTAL  Final   Special Requests Normal   Final   Culture     Final    Value: NO SALMONELLA, SHIGELLA, CAMPYLOBACTER, YERSINIA, OR E.COLI 0157:H7 ISOLATED     Performed at [facility]   Report Status 09/12/2013 FINAL   Final  CLOSTRIDIUM DIFFICILE BY PCR     Status: None   Collection Time    09/08/13  2:55 PM      Result Value Ref Range Status   C difficile by pcr NEGATIVE  NEGATIVE Final     Scheduled Meds: . antiseptic oral rinse  15 mL Mouth Rinse QID  . chlorhexidine  15 mL Mouth Rinse BID  . collagenase   Topical Daily  . feeding supplement (ENSURE COMPLETE)  237 mL Oral BID BM  . folic acid  1 mg Oral Daily  . pantoprazole  40 mg Oral BID  . sucralfate  1 g Oral TID WC & HS  . thiamine  100 mg Oral Daily   35 mins spent   Cherene Altes, MD [facility] For Consults/Admissions > Flow Manager - [phone] Office  [phone] Pager [phone]  On-Call/Text Page:      [email or web address]      password [facility]  09/18/2013, 10:44 AM   LOS: 11 days

## 2013-09-19 LAB — TYPE AND SCREEN
ABO/RH(D): A POS
Antibody Screen: NEGATIVE
Unit division: 0
Unit division: 0

## 2013-09-19 LAB — CBC
HCT: 20.9 % — ABNORMAL LOW (ref 39.0–52.0)
Hemoglobin: 7.1 g/dL — ABNORMAL LOW (ref 13.0–17.0)
MCH: 31.1 pg (ref 26.0–34.0)
MCHC: 34 g/dL (ref 30.0–36.0)
MCV: 91.7 fL (ref 78.0–100.0)
Platelets: 186 10*3/uL (ref 150–400)
RBC: 2.28 MIL/uL — ABNORMAL LOW (ref 4.22–5.81)
RDW: 14.9 % (ref 11.5–15.5)
WBC: 3.6 10*3/uL — ABNORMAL LOW (ref 4.0–10.5)

## 2013-09-19 LAB — BASIC METABOLIC PANEL
BUN: 16 mg/dL (ref 6–23)
CO2: 22 mEq/L (ref 19–32)
Calcium: 6.7 mg/dL — ABNORMAL LOW (ref 8.4–10.5)
Chloride: 96 mEq/L (ref 96–112)
Creatinine, Ser: 1.23 mg/dL (ref 0.50–1.35)
GFR calc non Af Amer: 64 mL/min — ABNORMAL LOW (ref >90)
GFR, EST AFRICAN AMERICAN: 74 mL/min — AB (ref >90)
Glucose, Bld: 114 mg/dL — ABNORMAL HIGH (ref 70–99)
POTASSIUM: 3.6 meq/L — AB (ref 3.7–5.3)
Sodium: 129 mEq/L — ABNORMAL LOW (ref 137–147)

## 2013-09-19 LAB — GLUCOSE, CAPILLARY
GLUCOSE-CAPILLARY: 21 mg/dL — AB (ref 70–99)
GLUCOSE-CAPILLARY: 95 mg/dL (ref 70–99)
Glucose-Capillary: 104 mg/dL — ABNORMAL HIGH (ref 70–99)
Glucose-Capillary: 92 mg/dL (ref 70–99)

## 2013-09-19 MED ORDER — OXYCODONE HCL 5 MG PO TABS
5.0000 mg | ORAL_TABLET | ORAL | Status: DC | PRN
  Administered 2013-09-19 – 2013-09-21 (×11): 10 mg via ORAL
  Filled 2013-09-19 (×12): qty 2

## 2013-09-19 MED ORDER — FENTANYL CITRATE 0.05 MG/ML IJ SOLN
12.5000 ug | INTRAMUSCULAR | Status: DC | PRN
  Administered 2013-09-21: 25 ug via INTRAVENOUS
  Filled 2013-09-19: qty 2

## 2013-09-19 MED ORDER — POTASSIUM CHLORIDE CRYS ER 20 MEQ PO TBCR
40.0000 meq | EXTENDED_RELEASE_TABLET | Freq: Once | ORAL | Status: AC
  Administered 2013-09-19: 40 meq via ORAL
  Filled 2013-09-19: qty 2

## 2013-09-19 NOTE — Progress Notes (Signed)
Physical Therapy Wound Treatment Patient Details  Name: Edwin [name] MRN: [id number] Date of Birth: August 19, 1955  Today's Date: 09/19/2013 Time: 3532-9924 Time Calculation (min): 33 min  Subjective  Subjective: Pt stating it was a quiet weekend.  Pain Score: Pain Score: Moderate pain with pulsatile lavage and debridement. Premedicated. Pain relieved with end of treatment.  Wound Assessment  Pressure Ulcer 09/08/13 Unstageable - Full thickness tissue loss in which the base of the ulcer is covered by slough (yellow, tan, gray, green or brown) and/or eschar (tan, brown or black) in the wound bed. unstageable wound under left buttock and thigh (Active)  Dressing Type Moist to dry;Foam;Barrier Film (skin prep) 09/19/2013  9:55 AM  Dressing Changed 09/19/2013  9:55 AM  Dressing Change Frequency Daily 09/19/2013  9:55 AM  State of Healing Early/partial granulation 09/19/2013  9:55 AM  Site / Wound Assessment Pink;Yellow 09/19/2013  9:55 AM  % Wound base Red or Granulating 65% 09/19/2013  9:55 AM  % Wound base Yellow 35% 09/19/2013  9:55 AM  % Wound base Black 0% 09/19/2013  9:55 AM  % Wound base Other (Comment) 0% 09/19/2013  9:55 AM  Peri-wound Assessment Intact 09/16/2013 11:32 AM  Wound Length (cm) 9 cm 09/13/2013  9:45 AM  Wound Width (cm) 6 cm 09/13/2013  9:45 AM  Wound Depth (cm) 0 cm 09/13/2013  9:45 AM  Margins Unattached edges (unapproximated) 09/19/2013  9:55 AM  Drainage Amount Minimal 09/19/2013  9:55 AM  Drainage Description Serous 09/19/2013  9:55 AM  Treatment Debridement (Selective);Hydrotherapy (Pulse lavage);Packing (Saline gauze) 09/19/2013  9:55 AM   Hydrotherapy Pulsed lavage therapy - wound location: Lt ischium Pulsed Lavage with Suction (psi): 8 psi Pulsed Lavage with Suction - Normal Saline Used: 1000 mL Pulsed Lavage Tip: Tip with splash shield Selective Debridement Selective Debridement - Location: lt ischium Selective Debridement - Tools Used: Forceps;Scissors Selective  Debridement - Tissue Removed: yellow necrotic tissue   Wound Assessment and Plan  Wound Therapy - Assess/Plan/Recommendations Wound Therapy - Clinical Statement: Wound continuing to improve. Will probably only need to see for hydrotherapy 1-2 more times. Hydrotherapy Plan: Debridement;Dressing change;Patient/family education;Pulsatile lavage with suction Wound Therapy - Frequency: 3X / week Wound Therapy - Follow Up Recommendations: Skilled nursing facility Wound Plan: See above  Wound Therapy Goals- Improve the function of patient's integumentary system by progressing the wound(s) through the phases of wound healing (inflammation - proliferation - remodeling) by: Decrease Necrotic Tissue to: 25 Decrease Necrotic Tissue - Progress: Progressing toward goal Increase Granulation Tissue to: 75 Increase Granulation Tissue - Progress: Progressing toward goal  Goals will be updated until maximal potential achieved or discharge criteria met.  Discharge criteria: when goals achieved, discharge from hospital, MD decision/surgical intervention, no progress towards goals, refusal/missing three consecutive treatments without notification or medical reason.  GP     Edwin [name] 09/19/2013, 10:00 AM  [facility] PT [phone]

## 2013-09-19 NOTE — Progress Notes (Addendum)
[facility] not accepting LOGs from the hospital at this time. CSW left voicemail with [facility] admissions requesting return call to follow up on referral and inquire about bed availability. CSW called [facility] and left voicemail on administrator's office number. CSW called administrator's mobile phone and requested call back to discuss bed availability. Pt currently has no bed offers.  Addendum: [facility] states they do not take LOGs.  Addendum: CSW called [facility] and left voicemail asking if they have looked over pt's clinicals and are taking LOGs. Pt currently has "no's" from 32 facilities. CSW spoke with administrator at [facility], who responded "considering" to request. Administrator states she meant to change "considering" response to "no," as they are unable to take LOGs because their physical therapists are employees of the facility. Administrator states pt's level of care needs are higher than the Medicaid reimbursement rate and the facility will not accept pt.  Addendum: CSW called [facility] and left voicemails for administrators explaining pt is going to need placement as a difficult to place patient. CSW waiting for return call from administrators.  Addendum: [facility] cannot offer pt a bed. CSW called [facility] and left another voicemail with admissions explaining placement at [facility] is only option.  Ky Barban, MSW, [facility] Clinical Social Worker [phone]
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
## 2013-09-19 NOTE — Progress Notes (Addendum)
Progress Note [facility] TEAM 1 - Stepdown/ICU TEAM   Edwin [name] [id number]:[id number] DOB: 1956-02-12 DOA: 09/07/2013 PCP: Webb Silversmith, NP  Brief narrative: 65QI man with severe PAD who was admitted on 2/12 with septic shock, ARF, metabolic acidosis and a non healing gangrenous RLE ulcer.  He has had a complicated medical course since admit including an endoscopy/flex sig showing no active bleeding and multiple shallow gastric ulcers.  There had been concern for GI Bleeding and he was noted to have a drop in Hgb on 2/19 for which he received 1 unit of PRBC.  He underwent a Rt AKA on 2/13. Partial SBO vs. Ileus was noted on CT Abdomen 2/16.  He also underwent a TTE on 2/17 showing grade 1 diastolic dysfunction.  He was treated with ABx for his sepsis with Vancomycin, Zosyn, and Clindamycin.  He was successfully extubated on 2/17.   SIGNIFICANT EVENTS / STUDIES:  2/12 R tib/fib XR > No osteo  2/12 R ankle > No acute process  2/12 Endoscopy / flex sig > No active bleed, large gastric residual, multiple shallow gastric ulcers without hemorrhage. Suspected nonsteroidal gastropathy vs gastric mucosal ischemia.  2/12 C.diff neg, stool culture neg  2/13 R AKA, remained intubated after surgery  2/16 remained vented, shock, concern SBO  2/16 ct abdo > Multiple dilated small bowel loops without definite transition point. Minimal free fluid. Air-fluid throughout the colon. Ileus versus early/partial distal small bowel obstruction .  2/17 Echo: EF 55% to 60%. Diastolic 1 dysfunction 3/47 extubated  HPI/Subjective: No new complaints.  Denies n/v, or abdom pain.  Tolerating diet w/o difficulty.  Motivated to participate in therapy.    Assessment/Plan:  Gangrene of R lower extremity with concomitant septic shock, metabolic acidosis - Antibiotics completed as of 2/18 - Vascular following - will need staples removed in approximately 2 weeks at 3 weeks postop - PT/OT   Anemia, due to GIB + acute illness  + acute kidney failure  - Follow closely, transfuse as needed - goal is to keep Hgb 7.0 or > - check Fe studies in AM as IV Fe load may be indicated   Multiple shallow gastric ulcers  - Suspected nonsteroidal gastropathy vs gastric mucosal ischemia - cont protonix   Thrombocytopenia - HIT panel negative - Plts improving  - SCDs  Acute respiratory failure due to COPD and above - Extubated successfully - Albuterol PRN - O2 as needed  Partial SBO/Ileus - Resolved - tolerating regular diet    Acute renal failure - Nephrology was following along - Required CRT but has been off since 2/17 - crt normalizing   Hyperkalemia > Hypokalemia  - K+ balanced today - follow   Decubitus ulcer of sacral region, unstageable - WOC consult   Protein-calorie malnutrition, severe in the context of chronic illness - Underweight Body mass index is 20.79 kg/(m^2). - hypoglycemia intermittently - follow - cont Dextrose via IV until proven to be stable  - nutrition consult  HTN, Afib with RVR, PVD, CAD - well compensated at present   Consultants: PCCM Vascular Nephrology PMR  Antibiotics: Vancomycin 2/12 >>>2/17  Zosyn 2/12 >>> 2/18  Clindamycin 2/12 >>> 2/16  Code Status: FULL Family Communication: no family present at time of exam  Disposition Plan: CIR recommended, but CIR feels SNF more appropriate, will need coordination with SW - stable for transfer to medical bed   Objective: Filed Vitals:   09/19/13 0800 09/19/13 1000 09/19/13 1100 09/19/13 1500  BP: 99/66 115/59  Pulse: 95     Temp: 98.6 F (37 C)  98.2 F (36.8 C) 98.2 F (36.8 C)  TempSrc: Oral  Oral Oral  Resp: 16 19    Height:      Weight:      SpO2: 97%       Intake/Output Summary (Last 24 hours) at 09/19/13 1537 Last data filed at 09/19/13 1300  Gross per 24 hour  Intake 1652.92 ml  Output   1202 ml  Net 450.92 ml   Exam: General:  Alert and oriented - no acute resp distress Cardiovascular: Regular  rate and rhythm w/o appreciable gallup or rub  Respiratory: poor air movement B bases - no wheeze  Abdomen: Soft, non tender, non distended, bowel sounds present Extremities: No edema in left LE, RLE s/p AKA with stocking in place and dry/clean   Data Reviewed: Basic Metabolic Panel:  Recent Labs Lab 09/12/13 1641 09/13/13 0400 09/13/13 0654 09/14/13 0455  09/15/13 1207 09/16/13 0500 09/17/13 0617 09/18/13 0600 09/19/13 0400  NA 135* 136*  --  139  < > 132* 133* 132* 127* 129*  K 3.9 3.5*  --  3.0*  < > 3.6* 3.9 4.0 4.0 3.6*  CL 99 100  --  102  < > 99 102 99 95* 96  CO2 24 25  --  25  < > 20 21 23 24 22   GLUCOSE 90 114*  --  104*  < > 97 82 106* 104* 114*  BUN 17 13  --  19  < > 20 19 17 17 16   CREATININE 1.14 0.98  --  1.59*  < > 1.71* 1.62* 1.47* 1.36* 1.23  CALCIUM 7.6* 7.5*  --  7.7*  < > 7.1* 7.1* 6.8* 6.8* 6.7*  MG 2.1  --  2.3 2.2  --   --   --   --   --   --   PHOS 1.9* 1.6*  --  3.3  --   --  2.4  --   --   --   < > = values in this interval not displayed. Liver Function Tests:  Recent Labs Lab 09/12/13 1641 09/13/13 0400 09/14/13 0455 09/16/13 0500  AST  --  19 19  --   ALT  --  16 14  --   ALKPHOS  --  84 116  --   BILITOT  --  0.3 0.3  --   PROT  --  5.0* 5.0*  --   ALBUMIN 1.4* 1.4*  1.3* 1.4* 1.2*   CBC:  Recent Labs Lab 09/14/13 0455 09/15/13 0615 09/15/13 0820 09/16/13 0500 09/17/13 0617 09/19/13 0400  WBC 4.7 3.6*  --  3.4* 3.8* 3.6*  NEUTROABS 3.7  --   --   --   --   --   HGB 8.9* 6.7* 8.4* 8.5* 8.6* 7.1*  HCT 26.2* 20.2* 24.7* 25.1* 25.7* 20.9*  MCV 92.6 92.7  --  92.3 92.8 91.7  PLT 75* 74*  --  118* 170 186   CBG:  Recent Labs Lab 09/18/13 0757 09/18/13 1209 09/18/13 1655 09/19/13 0023 09/19/13 1207  GLUCAP 98 159* 107* 104* 92    No results found for this or any previous visit (from the past 240 hour(s)).   Scheduled Meds: . antiseptic oral rinse  15 mL Mouth Rinse QID  . chlorhexidine  15 mL Mouth Rinse BID  .  collagenase   Topical Daily  . feeding supplement (ENSURE COMPLETE)  237 mL  Oral BID BM  . folic acid  1 mg Oral Daily  . pantoprazole  40 mg Oral BID  . sucralfate  1 g Oral TID WC & HS  . thiamine  100 mg Oral Daily   Time spend:  54 mins   Cherene Altes, MD [facility] For Consults/Admissions > Flow Manager - [phone] Office  [phone] Pager [phone]  On-Call/Text Page:      [email or web address]      password [facility]  09/19/2013, 3:37 PM   LOS: 12 days

## 2013-09-20 LAB — CBC
HEMATOCRIT: 20.6 % — AB (ref 39.0–52.0)
HEMATOCRIT: 26.3 % — AB (ref 39.0–52.0)
Hemoglobin: 7 g/dL — ABNORMAL LOW (ref 13.0–17.0)
Hemoglobin: 8.9 g/dL — ABNORMAL LOW (ref 13.0–17.0)
MCH: 31.1 pg (ref 26.0–34.0)
MCH: 30.2 pg (ref 26.0–34.0)
MCHC: 34 g/dL (ref 30.0–36.0)
MCHC: 33.8 g/dL (ref 30.0–36.0)
MCV: 91.6 fL (ref 78.0–100.0)
MCV: 89.2 fL (ref 78.0–100.0)
Platelets: 221 10*3/uL (ref 150–400)
Platelets: 247 10*3/uL (ref 150–400)
RBC: 2.25 MIL/uL — ABNORMAL LOW (ref 4.22–5.81)
RBC: 2.95 MIL/uL — ABNORMAL LOW (ref 4.22–5.81)
RDW: 14.8 % (ref 11.5–15.5)
RDW: 16 % — ABNORMAL HIGH (ref 11.5–15.5)
WBC: 3 10*3/uL — ABNORMAL LOW (ref 4.0–10.5)
WBC: 3.7 10*3/uL — AB (ref 4.0–10.5)

## 2013-09-20 LAB — IRON AND TIBC
IRON: 13 ug/dL — AB (ref 42–135)
Saturation Ratios: 11 % — ABNORMAL LOW (ref 20–55)
TIBC: 123 ug/dL — ABNORMAL LOW (ref 215–435)
UIBC: 110 ug/dL — ABNORMAL LOW (ref 125–400)

## 2013-09-20 LAB — GLUCOSE, CAPILLARY
GLUCOSE-CAPILLARY: 77 mg/dL (ref 70–99)
GLUCOSE-CAPILLARY: 99 mg/dL (ref 70–99)
Glucose-Capillary: 109 mg/dL — ABNORMAL HIGH (ref 70–99)
Glucose-Capillary: 81 mg/dL (ref 70–99)
Glucose-Capillary: 95 mg/dL (ref 70–99)

## 2013-09-20 LAB — BASIC METABOLIC PANEL
BUN: 15 mg/dL (ref 6–23)
CO2: 24 mEq/L (ref 19–32)
CREATININE: 1.13 mg/dL (ref 0.50–1.35)
Calcium: 6.9 mg/dL — ABNORMAL LOW (ref 8.4–10.5)
Chloride: 98 mEq/L (ref 96–112)
GFR, EST AFRICAN AMERICAN: 82 mL/min — AB (ref >90)
GFR, EST NON AFRICAN AMERICAN: 71 mL/min — AB (ref >90)
GLUCOSE: 83 mg/dL (ref 70–99)
Potassium: 4 mEq/L (ref 3.7–5.3)
Sodium: 132 mEq/L — ABNORMAL LOW (ref 137–147)

## 2013-09-20 LAB — RETICULOCYTES
RBC.: 2.25 MIL/uL — ABNORMAL LOW (ref 4.22–5.81)
RETIC COUNT ABSOLUTE: 24.8 10*3/uL (ref 19.0–186.0)
RETIC CT PCT: 1.1 % (ref 0.4–3.1)

## 2013-09-20 LAB — PREPARE RBC (CROSSMATCH)

## 2013-09-20 LAB — FOLATE: Folate: 7.2 ng/mL

## 2013-09-20 LAB — VITAMIN B12: Vitamin B-12: 967 pg/mL — ABNORMAL HIGH (ref 211–911)

## 2013-09-20 LAB — FERRITIN: Ferritin: 327 ng/mL — ABNORMAL HIGH (ref 22–322)

## 2013-09-20 MED ORDER — SODIUM CHLORIDE 0.9 % IJ SOLN
10.0000 mL | INTRAMUSCULAR | Status: DC | PRN
  Administered 2013-09-20: 10 mL
  Administered 2013-09-20 – 2013-09-21 (×2): 20 mL

## 2013-09-20 MED ORDER — FUROSEMIDE 10 MG/ML IJ SOLN
20.0000 mg | Freq: Once | INTRAMUSCULAR | Status: AC
  Administered 2013-09-20: 20 mg via INTRAVENOUS
  Filled 2013-09-20: qty 2

## 2013-09-20 NOTE — Progress Notes (Signed)
IV team notified that pts IJ could be removed because the blood transfusion is finished. There is no order for the removal. IV team also pointed out that the IJ is tunneled and must be removed by IR.

## 2013-09-20 NOTE — Progress Notes (Cosign Needed)
Progress Note    Edwin [name] FTD:[id number] DOB: 05-10-1956 DOA: 09/07/2013 PCP: Webb Silversmith, NP  Brief narrative: 7CB man with severe PAD who was admitted on 2/12 with septic shock, ARF, metabolic acidosis and a non healing gangrenous RLE ulcer.  He has had a complicated medical course since admit including an endoscopy/flex sig showing no active bleeding and multiple shallow gastric ulcers.  There had been concern for GI Bleeding and he was noted to have a drop in Hgb on 2/19 for which he received 1 unit of PRBC, and again on 2/24 for which he received 2 more units of PRBCs.  He underwent a Rt AKA on 2/13. Partial SBO vs. Ileus was noted on CT Abdomen 2/16.  He also underwent a TTE on 2/17 showing grade 1 diastolic dysfunction.  He was treated with ABx for his sepsis with Vancomycin, Zosyn, and Clindamycin.  He was successfully extubated on 2/17.   SIGNIFICANT EVENTS / STUDIES:   2/12 Endoscopy / flex sig > No active bleed, large gastric residual, multiple shallow gastric ulcers without hemorrhage. Suspected nonsteroidal gastropathy vs gastric mucosal ischemia.  2/13 R AKA, remained intubated after surgery  2/16 remained vented, shock, concern SBO  2/17 Echo: EF 55% to 60%. Diastolic 1 dysfunction 7/62 extubated  HPI/Subjective: Eating well, good BM, complains of 8/10 pain at AKA site.  Assessment/Plan:  Gangrene of R lower extremity with concomitant septic shock, metabolic acidosis - Antibiotics completed as of 2/18 - Vascular following - will need staples removed in approximately 2 weeks at 3 weeks postop - PT/OT   Anemia, due to GIB + acute illness + acute kidney failure  - has received 3 units of PRBCs during this hospitalization.  (2 units on 2/24) - Anemia panel consistent with iron deficiency anemia.  Will discharge on ferrous sulfate.  Multiple shallow gastric ulcers  - Suspected nonsteroidal gastropathy vs gastric mucosal ischemia - cont protonix    Thrombocytopenia - HIT panel negative - Plts improving  - SCDs  Acute respiratory failure due to COPD and above - Extubated successfully - Albuterol PRN - O2 as needed  Partial SBO/Ileus - Resolved - tolerating regular diet.  Having bowel movements.   Acute renal failure - Nephrology was following along - Required continuous renal replacement therapy but has been off since 2/17 - crt normalizing   Hyperkalemia > Hypokalemia  - K+ balanced today - follow   Decubitus ulcer of sacral region, unstageable - WOC consult   Protein-calorie malnutrition, severe in the context of chronic illness  - Underweight Body mass index is 23.91 kg/(m^2). - hypoglycemia intermittently - follow - cont Dextrose via IV until proven to be stable  - nutrition consult  HTN, Afib with RVR, PVD, CAD - well compensated at present   Consultants: PCCM Vascular Nephrology PMR  Antibiotics: Vancomycin 2/12 >>>2/17  Zosyn 2/12 >>> 2/18  Clindamycin 2/12 >>> 2/16  Code Status: FULL Family Communication: no family present at time of exam  Disposition Plan: SNF when appropriate.  Objective: Filed Vitals:   09/20/13 1225 09/20/13 1325 09/20/13 1425 09/20/13 1520  BP: 113/70 104/68 113/63 101/63  Pulse: 97 91 82 87  Temp: 98.4 F (36.9 C) 98.5 F (36.9 C) 98.2 F (36.8 C) 98.9 F (37.2 C)  TempSrc: Oral Oral Oral Oral  Resp: 18 20 18 18   Height:      Weight:      SpO2: 99% 99% 93% 95%    Intake/Output Summary (Last 24 hours) at 09/20/13  Justin filed at 09/20/13 1520  Gross per 24 hour  Intake 1217.5 ml  Output   1825 ml  Net -607.5 ml   Exam: General:  Alert and oriented - no acute resp distress, appears pale. Cardiovascular: Regular rate and rhythm w/o appreciable gallup or rub  Respiratory: CTA - no wheeze , no accessory muscle movement Abdomen: Soft, non tender, non distended, bowel sounds present Extremities: No edema in left LE, RLE s/p AKA with stocking in place  and dry/clean   Data Reviewed: Basic Metabolic Panel:  Recent Labs Lab 09/14/13 0455  09/16/13 0500 09/17/13 0617 09/18/13 0600 09/19/13 0400 09/20/13 0810  NA 139  < > 133* 132* 127* 129* 132*  K 3.0*  < > 3.9 4.0 4.0 3.6* 4.0  CL 102  < > 102 99 95* 96 98  CO2 25  < > 21 23 24 22 24   GLUCOSE 104*  < > 82 106* 104* 114* 83  BUN 19  < > 19 17 17 16 15   CREATININE 1.59*  < > 1.62* 1.47* 1.36* 1.23 1.13  CALCIUM 7.7*  < > 7.1* 6.8* 6.8* 6.7* 6.9*  MG 2.2  --   --   --   --   --   --   PHOS 3.3  --  2.4  --   --   --   --   < > = values in this interval not displayed.  Liver Function Tests:  Recent Labs Lab 09/14/13 0455 09/16/13 0500  AST 19  --   ALT 14  --   ALKPHOS 116  --   BILITOT 0.3  --   PROT 5.0*  --   ALBUMIN 1.4* 1.2*   CBC:  Recent Labs Lab 09/14/13 0455 09/15/13 0615 09/15/13 0820 09/16/13 0500 09/17/13 0617 09/19/13 0400 09/20/13 0810  WBC 4.7 3.6*  --  3.4* 3.8* 3.6* 3.0*  NEUTROABS 3.7  --   --   --   --   --   --   HGB 8.9* 6.7* 8.4* 8.5* 8.6* 7.1* 7.0*  HCT 26.2* 20.2* 24.7* 25.1* 25.7* 20.9* 20.6*  MCV 92.6 92.7  --  92.3 92.8 91.7 91.6  PLT 75* 74*  --  118* 170 186 221   CBG:  Recent Labs Lab 09/19/13 1207 09/19/13 1656 09/20/13 09/20/13 0630 09/20/13 1155  GLUCAP 92 95 81 77 95    No results found for this or any previous visit (from the past 240 hour(s)).   Scheduled Meds: . antiseptic oral rinse  15 mL Mouth Rinse QID  . chlorhexidine  15 mL Mouth Rinse BID  . collagenase   Topical Daily  . feeding supplement (ENSURE COMPLETE)  237 mL Oral BID BM  . folic acid  1 mg Oral Daily  . furosemide  20 mg Intravenous Once  . pantoprazole  40 mg Oral BID  . sucralfate  1 g Oral TID WC & HS  . thiamine  100 mg Oral Daily   Time spend:  35 mins   Imogene Burn, [place] [facility] Pager: [phone]   09/20/2013, 3:31 PM   LOS: 13 days

## 2013-09-20 NOTE — Clinical Social Work Note (Signed)
CSW updated facility about patient. CSW explained that patient is not ready for DC today and will update facility when patient is medically stable for DC.   Liz Beach, [place], [place], [phone]

## 2013-09-20 NOTE — Progress Notes (Signed)
CSW called admissions with [facility] SNF requesting they call unit CSW Marshell Levan ASAP to arrange for pt to discharge to [facility] today as a difficult-to-place pt. Facility informed yesterday that pt would be coming to their facility today as difficult-to-place. Facility to follow up with Marshell Levan for discharge. This CSW signing off.    Ky Barban, MSW, [facility] Clinical Social Worker [phone]

## 2013-09-20 NOTE — Progress Notes (Signed)
OT Cancellation Note  Patient Details Name: Edwin [name] MRN: [id number] DOB: 1956-03-23   Cancelled Treatment:    Reason Eval/Treat Not Completed: Other (comment) pt. Currently receiving blood. Will check back as able but not d/c to snf is set for tomorrow.  Janice Coffin, COTA/L 09/20/2013, 2:19 PM

## 2013-09-20 NOTE — Progress Notes (Signed)
PT Cancellation Note  Patient Details Name: Edwin [name] MRN: [id number] DOB: 1956/04/22   Cancelled Treatment:    Reason Eval/Treat Not Completed: Other (comment) (on first attempt pt eating lunch and second attempt IV team with pt)   Derrian Poli 09/20/2013, 3:51 PM

## 2013-09-21 ENCOUNTER — Telehealth: Payer: Self-pay | Admitting: Vascular Surgery

## 2013-09-21 ENCOUNTER — Inpatient Hospital Stay
Admission: RE | Admit: 2013-09-21 | Discharge: 2013-10-07 | Disposition: A | Discharge status: Other Acute Inpatient Hospital | Payer: PRIVATE HEALTH INSURANCE | Source: Ambulatory Visit | Attending: Internal Medicine | Admitting: Internal Medicine

## 2013-09-21 LAB — BASIC METABOLIC PANEL
BUN: 16 mg/dL (ref 6–23)
CALCIUM: 7 mg/dL — AB (ref 8.4–10.5)
CO2: 23 meq/L (ref 19–32)
Chloride: 97 mEq/L (ref 96–112)
Creatinine, Ser: 1.04 mg/dL (ref 0.50–1.35)
GFR calc Af Amer: >90 mL/min (ref >90)
GFR calc non Af Amer: 78 mL/min — ABNORMAL LOW (ref >90)
Glucose, Bld: 94 mg/dL (ref 70–99)
POTASSIUM: 3.9 meq/L (ref 3.7–5.3)
SODIUM: 131 meq/L — AB (ref 137–147)

## 2013-09-21 LAB — TYPE AND SCREEN
ABO/RH(D): A POS
Antibody Screen: NEGATIVE
UNIT DIVISION: 0
UNIT DIVISION: 0

## 2013-09-21 LAB — GLUCOSE, CAPILLARY
GLUCOSE-CAPILLARY: 93 mg/dL (ref 70–99)
GLUCOSE-CAPILLARY: 104 mg/dL — AB (ref 70–99)

## 2013-09-21 LAB — CBC
HCT: 26.1 % — ABNORMAL LOW (ref 39.0–52.0)
Hemoglobin: 8.9 g/dL — ABNORMAL LOW (ref 13.0–17.0)
MCH: 30.5 pg (ref 26.0–34.0)
MCHC: 34.1 g/dL (ref 30.0–36.0)
MCV: 89.4 fL (ref 78.0–100.0)
PLATELETS: 251 10*3/uL (ref 150–400)
RBC: 2.92 MIL/uL — ABNORMAL LOW (ref 4.22–5.81)
RDW: 16 % — ABNORMAL HIGH (ref 11.5–15.5)
WBC: 3.1 10*3/uL — ABNORMAL LOW (ref 4.0–10.5)

## 2013-09-21 MED ORDER — ALBUTEROL SULFATE HFA 108 (90 BASE) MCG/ACT IN AERS: 2.0000 | INHALATION_SPRAY | Freq: Four times a day (QID) | RESPIRATORY_TRACT | Status: DC | PRN

## 2013-09-21 MED ORDER — ALBUTEROL SULFATE (2.5 MG/3ML) 0.083% IN NEBU: 2.5000 mg | INHALATION_SOLUTION | RESPIRATORY_TRACT | Status: DC | PRN

## 2013-09-21 MED ORDER — BIOTENE DRY MOUTH MT LIQD
15.0000 mL | Freq: Four times a day (QID) | OROMUCOSAL | Status: DC
Start: 2013-09-21 — End: 2013-12-22

## 2013-09-21 MED ORDER — ZOLPIDEM TARTRATE 5 MG PO TABS: 5.0000 mg | ORAL_TABLET | Freq: Every evening | ORAL | Status: DC | PRN

## 2013-09-21 MED ORDER — ENSURE COMPLETE PO LIQD: 237.0000 mL | Freq: Two times a day (BID) | ORAL | Status: DC

## 2013-09-21 MED ORDER — OXYCODONE HCL 10 MG PO TABS: 10.0000 mg | ORAL_TABLET | ORAL | Status: DC | PRN

## 2013-09-21 MED ORDER — ACETAMINOPHEN 325 MG PO TABS: 650.0000 mg | ORAL_TABLET | Freq: Four times a day (QID) | ORAL | Status: DC | PRN

## 2013-09-21 MED ORDER — LISINOPRIL 2.5 MG PO TABS: 2.5000 mg | ORAL_TABLET | Freq: Every day | ORAL | Status: DC

## 2013-09-21 MED ORDER — FUROSEMIDE 40 MG PO TABS: 40.0000 mg | ORAL_TABLET | Freq: Every day | ORAL | Status: DC

## 2013-09-21 MED ORDER — THIAMINE HCL 100 MG PO TABS: 100.0000 mg | ORAL_TABLET | Freq: Every day | ORAL | Status: DC

## 2013-09-21 MED ORDER — FERROUS FUM-IRON POLYSACCH 162-115.2 MG PO CAPS: 1.0000 | ORAL_CAPSULE | Freq: Every day | ORAL | Status: DC

## 2013-09-21 MED ORDER — PANTOPRAZOLE SODIUM 40 MG PO TBEC: 40.0000 mg | DELAYED_RELEASE_TABLET | Freq: Two times a day (BID) | ORAL | Status: DC

## 2013-09-21 MED ORDER — COLLAGENASE 250 UNIT/GM EX OINT: 1.0000 "application " | TOPICAL_OINTMENT | Freq: Every day | CUTANEOUS | Status: DC

## 2013-09-21 MED ORDER — FOLIC ACID 1 MG PO TABS: 1.0000 mg | ORAL_TABLET | Freq: Every day | ORAL | Status: DC

## 2013-09-21 MED ORDER — SUCRALFATE 1 GM/10ML PO SUSP: 1.0000 g | Freq: Three times a day (TID) | ORAL | Status: DC

## 2013-09-21 NOTE — Discharge Summary (Signed)
Physician Discharge Summary  Edwin [name] NUU:[id number] DOB: 1956/02/01 DOA: 09/07/2013  PCP: Webb Silversmith, NP  Admit date: 09/07/2013 Discharge date: 09/21/2013  Time spent: 50 minutes  Recommendations for Outpatient Follow-up:  1. Patient will have follow up with vascular surgery in 4 weeks. 2. He will need cbc / bmet on 3/2 to monitor hgb, platelets and electrolytes. 3. Wound care for bilateral ischial wounds and sacral decub.    Discharge Diagnoses:  Principal Problem:   Gangrene of lower extremity Active Problems:   Septic shock   Acute renal failure   Metabolic acidosis   COPD (chronic obstructive pulmonary disease)   Hyperkalemia   Decubitus ulcer of sacral region, unstageable   Acute respiratory failure   Protein-calorie malnutrition, severe   Discharge Condition: stable.  Diet recommendation: heart healthy  Filed Weights   09/19/13 0359 09/20/13 0442 09/21/13 0403  Weight: 63.9 kg (140 lb 14 oz) 61.2 kg (134 lb 14.7 oz) 62.5 kg (137 lb 12.6 oz)    History of present illness at the time of admission:  58 year old w/ known h/o severe PAD and non-healing ulcer of right LE, had recent right arthrectomy for known severe PAD of right SFA (aug 2014). Since followed at wound clinic, and dressing changed by daughter. Daughter notes that he has had episodes of confusion and hallucinations over the last few days, and recently noted that the posterior aspect of his LE wound has had more discharge. PT presented to ER 2/12 w/ CC Shortness of breath. CXR was clear. Wound on RLE gangrenous w/ fouls smelling d/c, dx eval showed acute renal failure, severe metabolic acidosis and hyperkalemia. Developed widened QRS in ED, required emergent rx. PCCM asked to admit given concern for sepsis.  Hospital Course:   Gangrene of R lower extremity with concomitant septic shock, metabolic acidosis  -Admitted to the ICU under the service of Critical Care. - Underwent Right AKA by Dr. Sherren Mocha  Early of Vascular.  Will need staples removed in 4 weeks. - Antibiotics completed as of 2/18  - Continue Oxy for pain management. - Septic shock resolved after pressors, IVF, AKA and antibiotic treatment.  Anemia, due to GIB  -Has received 3 units of PRBCs during this hospitalization. (2 units on 2/24)  -Hgb currently stable at 8.9 with no signs of bleeding (no melena) -Anemia panel consistent with iron deficiency anemia. Will discharge on ferrous polysaccharide.  Acute renal failure -Due to septic shock and infection -Nephrology consulted. -Required continuous renal replacement therapy but has been off since 2/17  -Further treated with IVF and removal of nephrotoxic agents -Will add back lasix and low dose Ace I on discharge  Multiple shallow gastric ulcers  - Suspected nonsteroidal gastropathy vs gastric mucosal ischemia  - Underwent Upper Endoscopy  - Discharged on BID protonix x 4 weeks, then decrease to daily protonix.  Thrombocytopenia  - may have been due to acute illness +/- antibiotic therapy. - HIT panel negative - Plts have normalized  Acute respiratory failure due to COPD and above  - Extubated successfully  - Albuterol nebulizer and inhaler to use PRN. - O2 as needed  - Patient is a smoker.  Encourage him to maintain tobacco cessation on discharge.  Partial SBO/Ileus  -Post op. -Resolved - tolerating regular diet. Having bowel movements.    Hyperkalemia > Hypokalemia  - Potassium was originally elevated due to infection - dropped with treatment. - normalized on discharge - Will resume daily potassium supplementation on discharge as we  are resuming lasix.  Decubitus ulcer of sacral region, unstageable  - WOC consult  - Will need ongoing wound care to bilateral ischial wounds and sacral decub.  Protein-calorie malnutrition, severe in the context of chronic illness  - Underweight Body mass index is 23.91 kg/(m^2).  - nutrition consult completed.  Placed on  supplements.  HTN, Afib with RVR, PVD, CAD, D-HF - well compensated at present   Procedures: 2/12 Endoscopy / flex sig > No active bleed, large gastric residual, multiple shallow gastric ulcers without hemorrhage. Suspected nonsteroidal gastropathy vs gastric mucosal ischemia.  2/13 R AKA, remained intubated after surgery  2/16 remained vented, shock, concern SBO  2/17 Echo: EF 55% to 60%. Diastolic 1 dysfunction  6/19 extubated 2/24 Has received a total of 3 units of PRBCs this admission.  Consultations:  Admitted by PCCM  Vascular  Nephrology  Antibiotics: Vancomycin 2/12 >>>2/17  Zosyn 2/12 >>> 2/18  Clindamycin 2/12 >>> 2/16  Discharge Exam: Filed Vitals:   09/21/13 0403  BP: 115/62  Pulse: 93  Temp: 99 F (37.2 C)  Resp: 15    General: Awake orientated, NAD, lying comfortably in bed Cardiovascular: RRR no m/r/g Respiratory: expiratory wheeze, no accessory muscle use. Abdomen: Nt, Nd, +bs, no masses Extremities:  RLE AKA (site covered with sock), Patient able to move all 4. Psych:  Patient A&O, Cooperative, appropriate.  Discharge Instructions      Discharge Orders   Future Appointments Provider Department Dept Phone   10/18/2013 10:30 AM Rosetta Posner, MD [facility] [phone]   02/16/2014 10:30 AM [facility] [address] [phone]   02/16/2014 11:30 AM Elam Dutch, MD [facility] [place] [phone]   Future Orders Complete By Expires   Diet - low sodium heart healthy  As directed    Increase activity slowly  As directed        Medication List    STOP taking these medications       HYDROcodone-acetaminophen 5-325 MG per tablet  Commonly known as:  NORCO/VICODIN     naproxen 500 MG tablet  Commonly known as:  NAPROSYN     sodium hypochlorite external solution  Commonly known as:  DAKIN'S 1/2 STRENGTH     traMADol 50 MG tablet  Commonly known as:  ULTRAM       TAKE these medications       acetaminophen 325 MG tablet  Commonly known as:  TYLENOL  Take 2 tablets (650 mg total) by mouth every 6 (six) hours as needed for mild pain, fever or headache.     albuterol (2.5 MG/3ML) 0.083% nebulizer solution  Commonly known as:  PROVENTIL  Take 3 mLs (2.5 mg total) by nebulization every 2 (two) hours as needed for wheezing or shortness of breath.     albuterol 108 (90 BASE) MCG/ACT inhaler  Commonly known as:  PROVENTIL HFA;VENTOLIN HFA  Inhale 2 puffs into the lungs every 6 (six) hours as needed for wheezing or shortness of breath.     antiseptic oral rinse Liqd  15 mLs by Mouth Rinse route QID.     collagenase ointment  Commonly known as:  SANTYL  Apply 1 application topically daily.     feeding supplement (ENSURE COMPLETE) Liqd  Take 237 mLs by mouth 2 (two) times daily between meals.     ferrous fumarate-iron polysaccharide complex 162-115.2 MG Caps  Commonly known as:  TANDEM  Take 1 capsule by mouth daily  with breakfast.     folic acid 1 MG tablet  Commonly known as:  FOLVITE  Take 1 tablet (1 mg total) by mouth daily.     furosemide 40 MG tablet  Commonly known as:  LASIX  Take 1 tablet (40 mg total) by mouth daily.     lisinopril 2.5 MG tablet  Commonly known as:  PRINIVIL,ZESTRIL  Take 1 tablet (2.5 mg total) by mouth daily.     mirtazapine 15 MG disintegrating tablet  Commonly known as:  REMERON SOL-TAB  Take 15 mg by mouth at bedtime.     Oxycodone HCl 10 MG Tabs  Take 1 tablet (10 mg total) by mouth every 4 (four) hours as needed.     pantoprazole 40 MG tablet  Commonly known as:  PROTONIX  Take 1 tablet (40 mg total) by mouth 2 (two) times daily.     potassium chloride SA 20 MEQ tablet  Commonly known as:  K-DUR,KLOR-CON  Take 2 tablets (40 mEq total) by mouth 2 (two) times daily.     sucralfate 1 GM/10ML suspension  Commonly known as:  CARAFATE  Take 10 mLs (1 g total) by mouth 4 (four) times daily -   with meals and at bedtime.     thiamine 100 MG tablet  Take 1 tablet (100 mg total) by mouth daily.     zolpidem 5 MG tablet  Commonly known as:  AMBIEN  Take 1-2 tablets (5-10 mg total) by mouth at bedtime as needed for sleep.       Allergies  Allergen Reactions  . Codeine Nausea Only   Follow-up Information   Follow up with EARLY, TODD, MD In 4 weeks. (Office will call you to arrange your appt (sent))    Specialty:  Vascular Surgery   Contact information:   [address] [place] [address] [phone]        The results of significant diagnostics from this hospitalization (including imaging, microbiology, ancillary and laboratory) are listed below for reference.    Significant Diagnostic Studies: Ct Abdomen Pelvis Wo Contrast  09/12/2013   CLINICAL DATA:  Rule out obstruction.  Abdominal distention.  EXAM: CT ABDOMEN AND PELVIS WITHOUT CONTRAST  TECHNIQUE: Multidetector CT imaging of the abdomen and pelvis was performed following the standard protocol without intravenous contrast.  COMPARISON:  Plain films 09/12/2013 and 09/11/2013  FINDINGS: Lung bases demonstrate subtle reticulonodular pattern of opacification over the posterior lower lobes. Mild posterior bibasilar atelectatic change. Minimal contrast is present within the distal esophagus.  Abdominal images demonstrate the liver, spleen, pancreas, gallbladder, adrenal glands and kidneys to the within normal. There are calcifications over the kidneys bilaterally likely vascular. There is moderate calcified plaque involving the abdominal aorta and iliac vessels. Infrarenal abdominal aorta measures 2.9 cm AP diameter.  There are multiple old dilated small bowel loops with minimal wall thickening of several mid small bowel loops. No definite transition point is seen. There appears to be a somewhat gradual transition to mildly prominent but nondilated terminal ileum. Fluid and air are present throughout the colon. No free peritoneal  air. Appendix is not definitely seen although there appears to be an appendicolith at the origin. Minimal free fluid is present.  Pelvic images demonstrate a Foley catheter within a collapsed bladder. There are moderate degenerative changes of the spine. There is a mild grade 1 anterolisthesis of L4 on L5 due to an old L4 spondylolysis. There are degenerative changes of the hips.  IMPRESSION: Multiple dilated small  bowel loops without definite transition point. There appears of the a gradual transition to more normal caliber terminal ileum. Minimal free fluid. Air-fluid throughout the colon. Findings may be due to ileus versus early/partial distal small bowel obstruction.  Subtle reticulonodular pattern of opacification over the posterior lower lobes likely due to an infectious versus inflammatory process. Recommend followup CT in 6-8 weeks after treatment.  Mild dilatation of the infrarenal abdominal aorta measuring 2.9 cm in AP diameter. Recommend followup ultrasound in 5 years. This recommendation follows ACR consensus guidelines: White Paper of the ACR Incidental Findings Committee II on Vascular Findings. J Am Coll Radiol [phone].  Degenerate changes the spine with grade 1 anterolisthesis of L4 on L5 due to chronic L4 spondylolysis.   Electronically Signed   By: Marin Olp M.D.   On: 09/12/2013 16:22   Dg Chest Port 1 View  09/13/2013   CLINICAL DATA:  Post extubation  EXAM: PORTABLE CHEST - 1 VIEW  COMPARISON:  Portable exam 1050 hr compared to 09/13/2013 at 0513 hr  FINDINGS: Endotracheal and nasogastric tubes no longer identified.  Bilateral jugular lines unchanged.  Normal heart size, mediastinal contours and pulmonary vascularity.  Atherosclerotic calcification aorta.  Bronchitic changes without infiltrate, pleural effusion or pneumothorax.  Probable skin fold projects over right upper lobe laterally.  IMPRESSION: Bronchitic changes without acute infiltrate.   Electronically Signed   By: Lavonia Dana M.D.   On: 09/13/2013 13:51   Dg Chest Port 1 View  09/13/2013   CLINICAL DATA:  Shortness of breath, ventilator.  EXAM: PORTABLE CHEST - 1 VIEW  COMPARISON:  09/12/2013  FINDINGS: Support devices are in stable position. Heart is normal size. Lungs are clear. No effusions or acute bony abnormality.  IMPRESSION: No acute cardiopulmonary disease.  Support devices stable.   Electronically Signed   By: Rolm Baptise M.D.   On: 09/13/2013 06:55   Dg Chest Port 1 View  09/12/2013   CLINICAL DATA:  Central line placement  EXAM: PORTABLE CHEST - 1 VIEW  COMPARISON:  Portable chest x-ray of 09/12/2013  FINDINGS: The tip of the endotracheal tube appears to be approximately 8.3 cm above the carina. A right central venous line has retracted slightly with the tip overlying the upper SVC. A left central venous line tip overlies the mid SVC. No pneumothorax is seen. The lungs are clear. Heart size is stable. An NG tube extends below the hemidiaphragm.  IMPRESSION: 1. New left central venous line tip overlies the mid SVC. No pneumothorax. 2. Tip of endotracheal tube approximately 8.3 cm above the carina   Electronically Signed   By: Ivar Drape M.D.   On: 09/12/2013 16:00   Dg Chest Port 1 View  09/12/2013   CLINICAL DATA:  Intubation, history COPD, hypertension, coronary artery disease, CHF, asthma, smoking  EXAM: PORTABLE CHEST - 1 VIEW  COMPARISON:  Portable exam 0757 hr compared to 09/10/2013  FINDINGS: Tip of endotracheal tube projects 6.3 cm above carinal.  Nasogastric tube extends into stomach.  Right jugular central venous catheter tip projects over proximal SVC.  Normal heart size, mediastinal contours, and pulmonary vascularity.  Atherosclerotic calcifications aorta.  Emphysematous changes without infiltrate, pleural effusion, or pneumothorax.  Right glenohumeral degenerative changes.  IMPRESSION: No acute abnormalities.   Electronically Signed   By: Lavonia Dana M.D.   On: 09/12/2013 08:07   Dg Chest Port  1 View  09/10/2013   CLINICAL DATA:  Respiratory failure.  EXAM: PORTABLE CHEST - 1 VIEW  COMPARISON:  DG CHEST 1V PORT dated 09/08/2013; DG CHEST 1V PORT dated 09/07/2013  FINDINGS: Endotracheal tube remains with the tip approximately 6 cm above the carina. Right jugular central line positioning is stable. Nasogastric tube extends into the stomach. There is no evidence of pulmonary edema, consolidation, pneumothorax, nodule or pleural fluid. The heart size is normal.  IMPRESSION: No active disease.   Electronically Signed   By: Aletta Edouard M.D.   On: 09/10/2013 08:47   Dg Chest Port 1 View  09/08/2013   CLINICAL DATA:  HD catheter placement  EXAM: PORTABLE CHEST - 1 VIEW  COMPARISON:  September 07, 2013 9:12 p.m.  FINDINGS: The heart size and mediastinal contours are within normal limits. Right central line is identified with distal tip in the superior vena cava. There is no pneumothorax. There is no focal infiltrate, pulmonary edema, or pleural effusion. The visualized skeletal structures are stable.  IMPRESSION: Right jugular HD catheter is identified with the distal tip in the superior vena cava. There is no pneumothorax.   Electronically Signed   By: Abelardo Diesel M.D.   On: 09/08/2013 02:32   Dg Chest Portable 1 View  09/07/2013   CLINICAL DATA:  Shortness of breath  EXAM: PORTABLE CHEST - 1 VIEW  COMPARISON:  August 05, 2012  FINDINGS: The heart size and mediastinal contours are within normal limits. There is no focal infiltrate, pulmonary edema, or pleural effusion. In the lateral left seventh rib, there is mild cortical discontinuity suspicious for acute fracture. Chronic posttraumatic change of the lateral left eighth and ninth ribs are noted.  IMPRESSION: No focal infiltrate identified. Fracture of the lateral left seventh rib.   Electronically Signed   By: Abelardo Diesel M.D.   On: 09/07/2013 21:28   Dg Tibia/fibula Right Port  09/08/2013   CLINICAL DATA:  Osteomyelitis.  Open wound on the  right leg.  EXAM: PORTABLE RIGHT TIBIA AND FIBULA - 2 VIEW  COMPARISON:  Ankle film today.  FINDINGS: There is no plain film evidence of osteomyelitis. High density material is present in the soft tissues of the mid to distal right leg, suggesting radiopaque dressing material. There is no osteolysis of the tibia or fibula. Small vessel atherosclerosis. Irregularity of the distal soft tissues of the leg, suggesting ulceration.  IMPRESSION: No plain film evidence of osteomyelitis.   Electronically Signed   By: Dereck Ligas M.D.   On: 09/08/2013 00:23   Dg Ankle Right Port  09/08/2013   CLINICAL DATA:  Osteomyelitis.  Open wound on the right leg.  EXAM: PORTABLE RIGHT ANKLE - 2 VIEW  COMPARISON:  None.  FINDINGS: Osteopenia is present. There is soft tissue irregularity in the distal posterior leg. No cortical osteolysis or rarefaction of bone to suggest osseous infection. The ankle mortise is congruent. The alignment of the ankle is anatomic. Suggestion of pes planus.  IMPRESSION: No acute osseous injury. No plain film evidence of osteomyelitis. Soft tissue irregularity along the dorsal distal leg, compatible with large ulceration.   Electronically Signed   By: Dereck Ligas M.D.   On: 09/08/2013 00:22   Dg Abd Portable 1v  09/12/2013   CLINICAL DATA:  Abdominal distension, evaluate small bowel obstruction an NG tube positioning  EXAM: PORTABLE ABDOMEN - 1 VIEW  COMPARISON:  DG ABD PORTABLE 1V dated 09/11/2013  FINDINGS: Interval resolution of previously noted marked gas distention of the stomach. Enteric tube tip and side port overlie the expected location of the gastric fundus. There  is persistent marked gaseous distention of multiple loops of small bowel within next loop in the left mid hemiabdomen measuring approximately 4.2 cm in diameter. These findings are associated with a conspicuous paucity of distal colonic gas.  No supine evidence of pneumoperitoneum. No pneumatosis or portal venous gas.  Limited  visualization the lower thorax is unremarkable.  Multi-level thoracolumbar spine degenerative changes suspected. Vascular calcifications overlie the lower pelvis. Presumed bone island overlies the right ilium.  IMPRESSION: 1. Grossly unchanged findings worrisome for small bowel obstruction. 2. Decreased gaseous distention of the stomach. Enteric tube tip and side port remain over the gastric fundus.   Electronically Signed   By: Sandi Mariscal M.D.   On: 09/12/2013 07:38   Dg Abd Portable 1v  09/11/2013   CLINICAL DATA:  Abdominal distention.  EXAM: PORTABLE ABDOMEN - 1 VIEW  COMPARISON:  None.  FINDINGS: NG tube is in place with the side-port in the stomach. The stomach is markedly distended and there is distention of small bowel loops up to 5.4 cm. Little to no gas in the colon is seen.  IMPRESSION: Bowel gas pattern worrisome for small bowel obstruction with dilatation of small bowel and marked gaseous distention of the stomach.   Electronically Signed   By: Inge Rise M.D.   On: 09/11/2013 19:10     Labs: Basic Metabolic Panel:  Recent Labs Lab 09/16/13 0500 09/17/13 0617 09/18/13 0600 09/19/13 0400 09/20/13 0810 09/21/13 0500  NA 133* 132* 127* 129* 132* 131*  K 3.9 4.0 4.0 3.6* 4.0 3.9  CL 102 99 95* 96 98 97  CO2 21 23 24 22 24 23   GLUCOSE 82 106* 104* 114* 83 94  BUN 19 17 17 16 15 16   CREATININE 1.62* 1.47* 1.36* 1.23 1.13 1.04  CALCIUM 7.1* 6.8* 6.8* 6.7* 6.9* 7.0*  PHOS 2.4  --   --   --   --   --    Liver Function Tests:  Recent Labs Lab 09/16/13 0500  ALBUMIN 1.2*   CBC:  Recent Labs Lab 09/17/13 0617 09/19/13 0400 09/20/13 0810 09/20/13 2210 09/21/13 0500  WBC 3.8* 3.6* 3.0* 3.7* 3.1*  HGB 8.6* 7.1* 7.0* 8.9* 8.9*  HCT 25.7* 20.9* 20.6* 26.3* 26.1*  MCV 92.8 91.7 91.6 89.2 89.4  PLT 170 186 221 247 251   CBG:  Recent Labs Lab 09/20/13 1155 09/20/13 1633 09/20/13 2340 09/21/13 0634 09/21/13 1141  GLUCAP 95 99 109* 93 104*     SignedKaren Kitchens [phone]  [facility] 09/21/2013, 12:07 PM  Attending - Patient seen and examined, agree with the above assessment and plan. Doing much better and is stable for discharge. Please see above for details.  Nena Alexander MD

## 2013-09-21 NOTE — Telephone Encounter (Addendum)
Message copied by Gena Fray on Wed Sep 21, 2013  4:04 PM ------      Message from: Peter Minium K      Created: Wed Sep 21, 2013 10:07 AM      Regarding: Schedule                   ----- Message -----         From: Gabriel Earing, PA-C         Sent: 09/21/2013   9:06 AM           To: Vvs Charge Pool            I see that his appt is 3/24 for staple removal from right AKA 2/13.  Is there any way to get him in on the 10th or 17th?            He will probably be discharged from hospital today.  Not sure where to.            Thanks,      Samantha ------  09/21/13: spoke with pts daughter Jonelle Sidle to schedule, dpm

## 2013-09-21 NOTE — Progress Notes (Signed)
Physical Therapy Wound Treatment Patient Details  Name: Edwin [name] MRN: [id number] Date of Birth: 08-15-1955  Today's Date: 09/21/2013 Time: 4932-4199 Time Calculation (min): 16 min  Subjective  Subjective: Pt c/o pain med scheduling.  Pain Score: Pain Score: 7   Wound Assessment  Pressure Ulcer 09/08/13 Unstageable - Full thickness tissue loss in which the base of the ulcer is covered by slough (yellow, tan, gray, green or brown) and/or eschar (tan, brown or black) in the wound bed. unstageable wound under left buttock and thigh (Active)  Dressing Type Moist to dry;Foam;Gauze (Comment) 09/21/2013 11:10 AM  Dressing Changed 09/21/2013 11:10 AM  Dressing Change Frequency Daily 09/21/2013 11:10 AM  State of Healing Early/partial granulation 09/21/2013 11:10 AM  Site / Wound Assessment Pink;Yellow 09/21/2013 11:10 AM  % Wound base Red or Granulating 80% 09/21/2013 11:10 AM  % Wound base Yellow 20% 09/21/2013 11:10 AM  % Wound base Black 0% 09/21/2013 11:10 AM  % Wound base Other (Comment) 0% 09/21/2013 11:10 AM  Peri-wound Assessment Intact 09/21/2013 11:10 AM  Wound Length (cm) 9 cm 09/13/2013  9:45 AM  Wound Width (cm) 6 cm 09/13/2013  9:45 AM  Wound Depth (cm) 0 cm 09/13/2013  9:45 AM  Margins Unattached edges (unapproximated) 09/21/2013 11:10 AM  Drainage Amount Minimal 09/21/2013 11:10 AM  Drainage Description Serous 09/21/2013 11:10 AM  Treatment Debridement (Selective);Hydrotherapy (Pulse lavage) 09/21/2013 11:10 AM   Hydrotherapy Pulsed lavage therapy - wound location: Lt ischium Pulsed Lavage with Suction (psi): 8 psi Pulsed Lavage with Suction - Normal Saline Used: 1000 mL Pulsed Lavage Tip: Tip with splash shield Selective Debridement Selective Debridement - Location: lt ischium Selective Debridement - Tools Used: Other (comment) (4x4) Selective Debridement - Tissue Removed: yellow slough   Wound Assessment and Plan  Wound Therapy - Assess/Plan/Recommendations Wound Therapy -  Clinical Statement: Wound continues to look good. No further hydrotherapy needed. Hydrotherapy Plan: Other (comment) (DC hydro) Wound Therapy - Follow Up Recommendations: Skilled nursing facility (dressing changes) Wound Plan: See above  Wound Therapy Goals- Improve the function of patient's integumentary system by progressing the wound(s) through the phases of wound healing (inflammation - proliferation - remodeling) by: Decrease Necrotic Tissue to: 25 Decrease Necrotic Tissue - Progress: Met Increase Granulation Tissue to: 75 Increase Granulation Tissue - Progress: Met  Goals will be updated until maximal potential achieved or discharge criteria met.  Discharge criteria: when goals achieved, discharge from hospital, MD decision/surgical intervention, no progress towards goals, refusal/missing three consecutive treatments without notification or medical reason.  GP     Edwin [name] 09/21/2013, 11:16 AM  [name] PT [phone]

## 2013-09-21 NOTE — Progress Notes (Signed)
HydroTherapy Discharge Patient Details Name: Edwin [name] MRN: [id number] DOB: 09-09-1955 Today's Date: 09/21/2013 Time:  -     Patient discharged from hydrotherapy services secondary to goals met and no further hydrotherapy needs identified.  Please see latest therapy progress note for current level of functioning and progress toward goals.    Progress and discharge plan discussed with patient and/or caregiver: Patient/Caregiver agrees with plan  GP     [facility] 09/21/2013, 11:19 AM

## 2013-09-21 NOTE — Progress Notes (Signed)
[facility] Progress Note  09/21/2013 9:04 AM POD 9  Subjective:  No complaints  Tm 100.5 now 99 Otherwise, VSS   Filed Vitals:   09/21/13 0403  BP: 115/62  Pulse: 93  Temp: 99 F (37.2 C)  Resp: 15    Physical Exam: Incisions:  Right AKA stump is clean with staples in tact. Extremities:  Good ROM of stump  CBC    Component Value Date/Time   WBC 3.1* 09/21/2013 0500   RBC 2.92* 09/21/2013 0500   RBC 2.25* 09/20/2013 0810   HGB 8.9* 09/21/2013 0500   HCT 26.1* 09/21/2013 0500   PLT 251 09/21/2013 0500   MCV 89.4 09/21/2013 0500   MCH 30.5 09/21/2013 0500   MCHC 34.1 09/21/2013 0500   RDW 16.0* 09/21/2013 0500   LYMPHSABS 0.6* 09/14/2013 0455   MONOABS 0.3 09/14/2013 0455   EOSABS 0.0 09/14/2013 0455   BASOSABS 0.0 09/14/2013 0455    BMET    Component Value Date/Time   NA 131* 09/21/2013 0500   K 3.9 09/21/2013 0500   CL 97 09/21/2013 0500   CO2 23 09/21/2013 0500   GLUCOSE 94 09/21/2013 0500   BUN 16 09/21/2013 0500   CREATININE 1.04 09/21/2013 0500   CALCIUM 7.0* 09/21/2013 0500   GFRNONAA 78* 09/21/2013 0500   GFRAA >90 09/21/2013 0500    INR    Component Value Date/Time   INR 1.37 09/09/2013 0339     Intake/Output Summary (Last 24 hours) at 09/21/13 0904 Last data filed at 09/21/13 0830  Gross per 24 hour  Intake 1290.5 ml  Output   2775 ml  Net -1484.5 ml     Assessment/Plan:  58 y.o. male is s/p right above knee amputation  POD 9  -pt doing well this am -right AKA stump is viable -f/u with Dr. Donnetta Hutching 4 weeks from surgery for suture removal-office will arrange.   Leontine Locket, PA-C [facility] [phone] 09/21/2013 9:04 AM

## 2013-09-21 NOTE — Progress Notes (Signed)
PT Cancellation Note  Patient Details Name: TYSHON FANNING MRN: [id number] DOB: 1956/02/04   Cancelled Treatment:    Reason Eval/Treat Not Completed: Other (comment) (Pt refused due to pain but had been premedicated.)   Thalia Turkington 09/21/2013, 11:18 AM

## 2013-09-21 NOTE — Clinical Social Work Placement (Signed)
Clinical Social Work Department CLINICAL SOCIAL WORK PLACEMENT NOTE 09/21/2013  Patient:  Edwin [name], Edwin [name]  Account Number:  [id number] Admit date:  09/07/2013  Clinical Social Worker:  Ky Barban, Latanya Presser  Date/time:  09/16/2013 01:21 PM  Clinical Social Work is seeking post-discharge placement for this patient at the following level of care:   SKILLED NURSING   (*CSW will update this form in Epic as items are completed)     Patient/family provided with [place] Department of Clinical Social Work's list of facilities offering this level of care within the geographic area requested by the patient (or if unable, by the patient's family).  09/16/2013  Patient/family informed of their freedom to choose among providers that offer the needed level of care, that participate in Medicare, Medicaid or managed care program needed by the patient, have an available bed and are willing to accept the patient.    Patient/family informed of [facility]' ownership interest in [facility], as well as of the fact that they are under no obligation to receive care at this facility.  PASARR submitted to EDS on 09/16/2013 PASARR number received from EDS on 09/16/2013  FL2 transmitted to all facilities in geographic area requested by pt/family on  09/16/2013 FL2 transmitted to all facilities within larger geographic area on   Patient informed that his/her managed care company has contracts with or will negotiate with  certain facilities, including the following:     Patient/family informed of bed offers received:  09/21/2013 Patient chooses bed at [facility] Physician recommends and patient chooses bed at    Patient to be transferred to [facility] on  09/21/2013 Patient to be transferred to facility by Ambulance  The following physician request were entered in Epic:   Additional Comments: Per MD patient ready to DC to [facility]. RN, patient, and facility aware of  DC. RN given number for report. Dc packet on chart. Ambulance tranpsort requested for patient. CSW signing off.   Liz Beach, [place], [place], [phone]

## 2013-09-21 NOTE — Progress Notes (Signed)
NURSING PROGRESS NOTE  Edwin [name] [id number] Discharge Data: 09/21/2013 1:06 PM Attending Provider: Jonetta Osgood, MD [id number], Rollene Fare, NP     [facility] to be D/C'd Nursing Home per MD order.    All IV's discontinued with no bleeding noted.  All belongings returned to patient for patient to take home.   Last Vital Signs:  Blood pressure 115/62, pulse 93, temperature 99 F (37.2 C), temperature source Oral, resp. rate 15, height 5\' 3"  (1.6 m), weight 62.5 kg (137 lb 12.6 oz), SpO2 95.00%.  Discharge Medication List   Medication List    STOP taking these medications       HYDROcodone-acetaminophen 5-325 MG per tablet  Commonly known as:  NORCO/VICODIN     naproxen 500 MG tablet  Commonly known as:  NAPROSYN     sodium hypochlorite external solution  Commonly known as:  DAKIN'S 1/2 STRENGTH     traMADol 50 MG tablet  Commonly known as:  ULTRAM      TAKE these medications       acetaminophen 325 MG tablet  Commonly known as:  TYLENOL  Take 2 tablets (650 mg total) by mouth every 6 (six) hours as needed for mild pain, fever or headache.     albuterol (2.5 MG/3ML) 0.083% nebulizer solution  Commonly known as:  PROVENTIL  Take 3 mLs (2.5 mg total) by nebulization every 2 (two) hours as needed for wheezing or shortness of breath.     albuterol 108 (90 BASE) MCG/ACT inhaler  Commonly known as:  PROVENTIL HFA;VENTOLIN HFA  Inhale 2 puffs into the lungs every 6 (six) hours as needed for wheezing or shortness of breath.     antiseptic oral rinse Liqd  15 mLs by Mouth Rinse route QID.     collagenase ointment  Commonly known as:  SANTYL  Apply 1 application topically daily.     feeding supplement (ENSURE COMPLETE) Liqd  Take 237 mLs by mouth 2 (two) times daily between meals.     ferrous fumarate-iron polysaccharide complex 162-115.2 MG Caps  Commonly known as:  TANDEM  Take 1 capsule by mouth daily with breakfast.     folic acid 1 MG tablet   Commonly known as:  FOLVITE  Take 1 tablet (1 mg total) by mouth daily.     furosemide 40 MG tablet  Commonly known as:  LASIX  Take 1 tablet (40 mg total) by mouth daily.     lisinopril 2.5 MG tablet  Commonly known as:  PRINIVIL,ZESTRIL  Take 1 tablet (2.5 mg total) by mouth daily.     mirtazapine 15 MG disintegrating tablet  Commonly known as:  REMERON SOL-TAB  Take 15 mg by mouth at bedtime.     Oxycodone HCl 10 MG Tabs  Take 1 tablet (10 mg total) by mouth every 4 (four) hours as needed.     pantoprazole 40 MG tablet  Commonly known as:  PROTONIX  Take 1 tablet (40 mg total) by mouth 2 (two) times daily.     potassium chloride SA 20 MEQ tablet  Commonly known as:  K-DUR,KLOR-CON  Take 2 tablets (40 mEq total) by mouth 2 (two) times daily.     sucralfate 1 GM/10ML suspension  Commonly known as:  CARAFATE  Take 10 mLs (1 g total) by mouth 4 (four) times daily -  with meals and at bedtime.     thiamine 100 MG tablet  Take 1 tablet (100 mg total) by mouth daily.  zolpidem 5 MG tablet  Commonly known as:  AMBIEN  Take 1-2 tablets (5-10 mg total) by mouth at bedtime as needed for sleep.        Joslyn Hy, MSN, RN, [employer]

## 2013-09-22 ENCOUNTER — Other Ambulatory Visit: Payer: Self-pay | Admitting: *Deleted

## 2013-09-22 MED ORDER — OXYCODONE HCL 10 MG PO TABS: ORAL_TABLET | ORAL | Status: DC

## 2013-09-22 MED ORDER — ZOLPIDEM TARTRATE 5 MG PO TABS: ORAL_TABLET | ORAL | Status: DC

## 2013-09-22 NOTE — Telephone Encounter (Signed)
[facility] 

## 2013-09-23 ENCOUNTER — Non-Acute Institutional Stay (SKILLED_NURSING_FACILITY): Payer: Medicaid Other | Admitting: Internal Medicine

## 2013-09-23 DIAGNOSIS — J449 Chronic obstructive pulmonary disease, unspecified: Secondary | ICD-10-CM

## 2013-09-23 DIAGNOSIS — I739 Peripheral vascular disease, unspecified: Secondary | ICD-10-CM

## 2013-09-23 DIAGNOSIS — L89309 Pressure ulcer of unspecified buttock, unspecified stage: Secondary | ICD-10-CM

## 2013-09-23 DIAGNOSIS — S78119A Complete traumatic amputation at level between unspecified hip and knee, initial encounter: Secondary | ICD-10-CM

## 2013-09-23 NOTE — Progress Notes (Signed)
Patient ID: Edwin [name], male   DOB: [birth year], 58 y.o.   MRN: [id number] facility; [facility] Chief complaint; admission to SNF post aortic [place] 2/11 through 2/25 History; this is a 58 year old man with a known history of severe PAD and nonhealing ulcer of the right lower extremity. He had a recent right arthrectomy for known T8 to DD of the superficial femoral artery. He became acutely ill at home with episodes of confusion and hallucinations and apparently fell off the toilet per the patient. He developed a worsening wound and presented to the ER with shortness of breath. Wound on the right lower extremity was gangrenous with foul-smelling discharge. He had acute renal failure severe metabolic acidosis and hyperkalemia per ultimately he underwent a right AKA by Dr. early of vascular surgery per she required pressors IV fluid and above-knee amputation and antibiotic therapy.  Other issues included a GI bleed and required 3 units of packed red blood cells. Endoscopy showed nonsteroidal gastropathy vs. gastric mucosal ischemia he is on twice a day Protonix for 4 weeks then change to daily.  With regards to his acute renal failure He was seen by nephrology. He required dialysis at discharge he was started back on the Lasix and low-dose ACE inhibitor therapy  Other issues included the respiratory failure due to COPD he was extubated successfully. Thrombocytopenia felt to be secondary to acute illness and/or sepsis and/or beta lactam antibiotics. Hyperkalemia was managed on presentation he became hypokalemic and is back on potassium. He has wounds on his sacral area.  Past Medical History  Diagnosis Date  . COPD 11/27/2007    Qualifier: Diagnosis of  By: Garen Grams    . ERECTILE DYSFUNCTION 11/27/2007    Qualifier: Diagnosis of  By: Garen Grams    . HYPERLIPIDEMIA 11/27/2007    Qualifier: Diagnosis of  By: Garen Grams    . HYPERTENSION, BENIGN 05/23/2009    Qualifier: Diagnosis of   By: Melvyn Novas MD, Christena Deem   . Coronary artery disease     Cardiac catheterization in 2008 showed 99% stenosis in proximal left circumflex which was treated with Taxus drug-eluting stent. There was mild RCA and LAD disease with normal ejection fraction.  Marland Kitchen PAD (peripheral artery disease)     Previous right SFA stent in 2003. Directional atherectomy right SFA in 01/2013  . Tobacco use   . CHF (congestive heart failure)   . Hypertension   . Asthma    Past Surgical History  Procedure Laterality Date  . Nasal sinus surgery  2004  . Acne cyst removal    . Other surgical history      blocked artery  . Endarterectomy Left 08/16/2013    Procedure: Exploration of Left Neck/Fix Bleeding;  Surgeon: Elam Dutch, MD;  Location: [facility] OR;  Service: Vascular;  Laterality: Left;  . Esophagogastroduodenoscopy N/A 09/08/2013    Procedure: ESOPHAGOGASTRODUODENOSCOPY (EGD);  Surgeon: Cleotis Nipper, MD;  Location[facility] ENDOSCOPY;  Service: Endoscopy;  Laterality: N/A;  . Flexible sigmoidoscopy N/A 09/08/2013    Procedure: FLEXIBLE SIGMOIDOSCOPY;  Surgeon: Cleotis Nipper, MD;  Location: [facility] ENDOSCOPY;  Service: Endoscopy;  Laterality: N/A;  unprepp  . Amputation Right 09/09/2013    Procedure: AMPUTATION ABOVE KNEE;  Surgeon: Rosetta Posner, MD;  Location: [facility] OR;  Service: Vascular;  Laterality: Right;   Current Outpatient Prescriptions on File Prior to Visit  Medication Sig Dispense Refill  . acetaminophen (TYLENOL) 325 MG tablet Take 2 tablets (650 mg total) by mouth every  6 (six) hours as needed for mild pain, fever or headache.      . albuterol (PROVENTIL HFA;VENTOLIN HFA) 108 (90 BASE) MCG/ACT inhaler Inhale 2 puffs into the lungs every 6 (six) hours as needed for wheezing or shortness of breath.  1 Inhaler  2  . albuterol (PROVENTIL) (2.5 MG/3ML) 0.083% nebulizer solution Take 3 mLs (2.5 mg total) by nebulization every 2 (two) hours as needed for wheezing or shortness of breath.  75 mL  12  . antiseptic oral  rinse (BIOTENE) LIQD 15 mLs by Mouth Rinse route QID.      Marland Kitchen collagenase (SANTYL) ointment Apply 1 application topically daily.  15 g  0  . feeding supplement, ENSURE COMPLETE, (ENSURE COMPLETE) LIQD Take 237 mLs by mouth 2 (two) times daily between meals.      . ferrous fumarate-iron polysaccharide complex (TANDEM) 162-115.2 MG CAPS Take 1 capsule by mouth daily with breakfast.  30 capsule    . folic acid (FOLVITE) 1 MG tablet Take 1 tablet (1 mg total) by mouth daily.      . furosemide (LASIX) 40 MG tablet Take 1 tablet (40 mg total) by mouth daily.  68 tablet  0  . lisinopril (PRINIVIL,ZESTRIL) 2.5 MG tablet Take 1 tablet (2.5 mg total) by mouth daily.  34 tablet  0  . mirtazapine (REMERON SOL-TAB) 15 MG disintegrating tablet Take 15 mg by mouth at bedtime.      . Oxycodone HCl 10 MG TABS Take one tablet by mouth every 4 hours as needed for pain  180 tablet  0  . pantoprazole (PROTONIX) 40 MG tablet Take 1 tablet (40 mg total) by mouth 2 (two) times daily.      . potassium chloride SA (K-DUR,KLOR-CON) 20 MEQ tablet Take 2 tablets (40 mEq total) by mouth 2 (two) times daily.  136 tablet  0  . sucralfate (CARAFATE) 1 GM/10ML suspension Take 10 mLs (1 g total) by mouth 4 (four) times daily -  with meals and at bedtime.  420 mL  0  . thiamine 100 MG tablet Take 1 tablet (100 mg total) by mouth daily.      Marland Kitchen zolpidem (AMBIEN) 5 MG tablet Take one tablet by mouth at bedtime as needed for sleep  30 tablet  0   No current facility-administered medications on file prior to visit.   Social: Patient tells me he was living in a hotel room. Was working up to 2-3 weeks before his admission  reports that he has been smoking.  He does not have any smokeless tobacco history on file. He reports that he drinks about 14.4 ounces of alcohol per week. He reports that he uses illicit drugs.  family history includes Heart disease in his maternal grandfather and paternal grandfather. He was adopted.  Review of  systems HEENT; he denies oral pain or difficulty swallowing Respiratory no clear shortness of breath. He denies any cigarette cravings Cardiac no chest pain Abdomen states he is having loose stools but no abdominal pain nausea or vomiting GU Foley catheter in place. Patient states he had some difficulty voiding prior to admission Extremities he is complaining of pain in his right stump he has no pain in the left foot  Physical examination HEENT; he has a few remaining teeth however I see no oral lesions Respiratory crackles all over the left lower lobe at the base on the right. mild expiratory wheezing diffusely there is no digital clubbing his work of breathing is normal Cardiac;  heart sounds are normal there is no murmurs Abdomen no tenderness no liver no spleen no masses GU Foley catheter in place Extremities; there is several areas of concern here. Firstly he does not have palpable pulses below the femoral on the left. He is ischemic-looking change in his first second and fifth toe on the left. His right above-knee amputation site looks somewhat dusky but otherwise is closed well opposed. His peripheral pulses in the upper extremities are nonfiber in either. They're better on the left than the right I could not feel a brachial pulse on the right and a thready radial pulse on the right. His fingers he easily blanch and had delayed refill Skin; over his coccyx area there is a large superficial wound over the left ishium, unstageable wound over the coccyx and a smaller one over the right ishium all of these will require continued Santyl treatment. He has multiple dry excoriations over his hands on the dorsal surface Neurologic; 3+ out of 5 weakness in the left leg proximally his stump leg is actually stronger.  Impression/plan #1 status post right above-knee amputation due to gangrene in the right lower extremity. His stump is well opposed #2 severe peripheral vascular disease, I think the left  leg is at risk.. Changes in the upper extremities reminded me somewhat of Buerger's diesease. He is not currently a candidate for antiplatelet drug. He is not a diabetic #3 upper GI bleed on twice a day proton pump inhibitors his hemoglobin followup on 2/27 is 9.3. Also note his white count is 3.3 with a relatively normal differential count his platelet count has normalized at 325,000 #4 acute renal failure this is also normalized BUN is 14 creatinine 1.05 his potassium is 4.9 #5 bilateral crackles left greater than right. Last chest x-ray was 10 days ago in the hospital did not show an acute infiltrate. There is no clear evidence of congestive heart failure #6 COPD; this currently does not appear to be end-stage #7 generalized weakness he even has trouble sitting up in bed #8 apparent severe protein calorie malnutrition. This seems hardly surprising #9 pressure ulcers to his bilaterally shoulder tuberosity is in coccyx. The area over the coccyx will probably require mechanical debridement at some point. There is widespread candidal changes around these wounds which will try to treat 2. Pressure offing offloading will be paramount    Transthoracic Echocardiography  Patient:    Edwin [name], Edwin [name] MR #:       [id number] Study Date: 09/13/2013 Gender:     M Age:        44 Height:     175.3cm Weight:     54.3kg BSA:        1.60m^2 Pt. Status: Room:       2M03C    ATTENDING    Fredia Sorrow  Elon Alas, Vineet  SONOGRAPHER  Delman Kitten, RCS  ORDERING     Noel Journey  PERFORMING   [facility], Inpatient cc:  ------------------------------------------------------------ LV EF: 55% -   60%  ------------------------------------------------------------ Indications:      Dyspnea 786.09.  ------------------------------------------------------------ History:   PMH:  Alcohol abuse.  Chronic obstructive pulmonary disease.  Risk factors:  Current  tobacco use. Hypertension. Dyslipidemia.  ------------------------------------------------------------ Study Conclusions  Left ventricle: The cavity size was normal. Systolic function was normal. The estimated ejection fraction was in the range of 55% to 60%. Wall motion was normal; there were no regional wall motion  abnormalities. There was an increased relative contribution of atrial contraction to ventricular filling. Doppler parameters are consistent with abnormal left ventricular relaxation (grade 1 diastolic dysfunction).              Transthoracic echocardiography. M-mode, complete 2D, spectral Doppler, and color Doppler. Height:  Height: 175.3cm. Height: 69in.  Weight:  Weight: 54.3kg. Weight: 119.5lb.  Body mass index:  BMI: 17.7kg/m^2.  Body surface area:    BSA: 1.72m^2.  Blood pressure: 122/72.  Patient status:  Inpatient.  Location:  ICU/CCU  ------------------------------------------------------------  ------------------------------------------------------------ Left ventricle:  The cavity size was normal. Systolic function was normal. The estimated ejection fraction was in the range of 55% to 60%. Wall motion was normal; there were no regional wall motion abnormalities. There was an increased relative contribution of atrial contraction to ventricular filling. Doppler parameters are consistent with abnormal left ventricular relaxation (grade 1 diastolic dysfunction).  ------------------------------------------------------------ Aortic valve:   Trileaflet; mildly thickened, mildly calcified leaflets. Mobility was not restricted.  Doppler: Transvalvular velocity was within the normal range. There was no stenosis.  No regurgitation.  ------------------------------------------------------------ Aorta:  Aortic root: The aortic root was normal in size.  ------------------------------------------------------------ Mitral valve:   Structurally normal valve.   Mobility  was not restricted.  Doppler:  Transvalvular velocity was within the normal range. There was no evidence for stenosis. Trivial regurgitation.  ------------------------------------------------------------ Left atrium:  The atrium was normal in size.  ------------------------------------------------------------ Right ventricle:  The cavity size was normal. Wall thickness was normal. Systolic function was normal.  ------------------------------------------------------------ Pulmonic valve:    Doppler:  Transvalvular velocity was within the normal range. There was no evidence for stenosis.   ------------------------------------------------------------ Tricuspid valve:   Structurally normal valve.    Doppler: Transvalvular velocity was within the normal range.  No regurgitation.  ------------------------------------------------------------Results for Edwin [name], Edwin [name] (MRN [id number]) as of 09/23/2013 08:41  Ref. Range 09/20/2013 22:10 09/20/2013 23:40 09/21/2013 05:00 09/21/2013 06:34 09/21/2013 11:41  Glucose-Capillary Latest Range: 70-99 mg/dL  109 (H)  93 104 (H)  Sodium Latest Range: 137-147 mEq/L   131 (L)    Potassium Latest Range: 3.7-5.3 mEq/L   3.9    Chloride Latest Range: 96-112 mEq/L   97    CO2 Latest Range: 19-32 mEq/L   23    BUN Latest Range: 6-23 mg/dL   16    Creatinine Latest Range: 0.50-1.35 mg/dL   1.04    Calcium Latest Range: 8.4-10.5 mg/dL   7.0 (L)    GFR calc non Af Amer Latest Range: >90 mL/min   78 (L)    GFR calc Af Amer Latest Range: >90 mL/min   >90    Glucose Latest Range: 70-99 mg/dL   94    WBC Latest Range: 4.0-10.5 K/uL 3.7 (L)  3.1 (L)    RBC Latest Range: 4.22-5.81 MIL/uL 2.95 (L)  2.92 (L)    Hemoglobin Latest Range: 13.0-17.0 g/dL 8.9 (L)  8.9 (L)    HCT Latest Range: 39.0-52.0 % 26.3 (L)  26.1 (L)    MCV Latest Range: 78.0-100.0 fL 89.2  89.4    MCH Latest Range: 26.0-34.0 pg 30.2  30.5    MCHC Latest Range: 30.0-36.0 g/dL 33.8  34.1    RDW  Latest Range: 11.5-15.5 % 16.0 (H)  16.0 (H)    Platelets Latest Range: 150-400 K/uL 247  251     Pulmonary artery:   The main pulmonary artery was normal-sized. Systolic pressure was within the normal range.   ------------------------------------------------------------ Right  atrium:  The atrium was normal in size.  ------------------------------------------------------------ Pericardium:  There was no pericardial effusion.  ------------------------------------------------------------ Systemic veins:

## 2013-09-26 ENCOUNTER — Non-Acute Institutional Stay (SKILLED_NURSING_FACILITY): Payer: Medicaid Other | Admitting: Internal Medicine

## 2013-09-26 DIAGNOSIS — E43 Unspecified severe protein-calorie malnutrition: Secondary | ICD-10-CM

## 2013-09-27 ENCOUNTER — Other Ambulatory Visit: Payer: Self-pay | Admitting: *Deleted

## 2013-09-27 MED ORDER — ZOLPIDEM TARTRATE 10 MG PO TABS: ORAL_TABLET | ORAL | Status: DC

## 2013-09-27 NOTE — Telephone Encounter (Signed)
[facility] 

## 2013-09-27 NOTE — Progress Notes (Addendum)
Patient ID: Edwin [name], male   DOB: 03/16/1956, 58 y.o.   MRN: [id number]                   PROGRESS NOTE  DATE:  09/26/2013    FACILITY: [facility]    LEVEL OF CARE:   SNF   Acute Visit   CHIEF COMPLAINT:  Protein calorie malnutrition.     HISTORY OF PRESENT ILLNESS:  This is a gentleman who has known severe PAD and a nonhealing ulcer on the right lower extremity who underwent a right below-knee amputation for progressive nonhealing areas on the right lower leg.    He had acute renal failure and hyperkalemia.    He had a GI bleed requiring 2 U of packed cells.  Endoscopy showed a nonsteroidal gastropathy versus gastric mucosal ischemia.   He was discharged on Protonix 40 mg b.i.d.    He also has lower extremity wounds.    LABORATORY DATA:  Lab work has come back from today showing a sodium of 131, an albumin of 1.1, potassium of 4.1, BUN 15, creatinine 1.06.    REVIEW OF SYSTEMS:   GI:  The patient tells me he is eating.  Staff report 50-75% of his meals.  He has a nutritional supplement that he has consumed.  He tells me he is having liquid bowel movements, three yesterday and so far two today.  He denies dysphagia or odynophagia.   CHEST/RESPIRATORY:  No cough.  No sputum.   CARDIAC:    He does not have any chest pain.     PHYSICAL EXAMINATION:    GENERAL APPEARANCE:  The patient is not in any distress.   GASTROINTESTINAL:  LIVER/SPLEEN/KIDNEYS:  No liver, no spleen.   ABDOMEN:  No masses.  No tenderness is noted.   CARDIOVASCULAR:  CARDIAC:  He does not appear to be dehydrated.    ASSESSMENT/PLAN:  Severe protein calorie malnutrition.  It is not easy for me to remember an albumin this low.  I have discussed this with him.  I talked to the dietitian.  He requests a softer diet which we will arrange.    Diarrhea.  In this setting, of course, I will check him for pseudomembranous colitis.  This could be his proton pump inhibitors, etc.

## 2013-10-03 ENCOUNTER — Encounter: Payer: Self-pay | Admitting: Vascular Surgery

## 2013-10-04 ENCOUNTER — Encounter: Payer: Self-pay | Admitting: Vascular Surgery

## 2013-10-05 ENCOUNTER — Non-Acute Institutional Stay (SKILLED_NURSING_FACILITY): Payer: Medicaid Other | Admitting: Internal Medicine

## 2013-10-05 DIAGNOSIS — K922 Gastrointestinal hemorrhage, unspecified: Secondary | ICD-10-CM

## 2013-10-05 DIAGNOSIS — R634 Abnormal weight loss: Secondary | ICD-10-CM

## 2013-10-06 ENCOUNTER — Encounter (HOSPITAL_COMMUNITY)
Admit: 2013-10-06 | Discharge: 2013-10-06 | Disposition: A | Discharge status: Home / Self Care | Payer: Medicaid Other | Attending: Internal Medicine | Admitting: Internal Medicine

## 2013-10-06 ENCOUNTER — Non-Acute Institutional Stay (SKILLED_NURSING_FACILITY): Payer: Medicaid Other | Admitting: Internal Medicine

## 2013-10-06 DIAGNOSIS — D649 Anemia, unspecified: Secondary | ICD-10-CM | POA: Insufficient documentation

## 2013-10-06 DIAGNOSIS — M25569 Pain in unspecified knee: Secondary | ICD-10-CM

## 2013-10-06 DIAGNOSIS — E875 Hyperkalemia: Secondary | ICD-10-CM

## 2013-10-06 DIAGNOSIS — D62 Acute posthemorrhagic anemia: Secondary | ICD-10-CM

## 2013-10-06 LAB — PREPARE RBC (CROSSMATCH)

## 2013-10-06 LAB — ABO/RH: ABO/RH(D): A POS

## 2013-10-06 LAB — HEMOGLOBIN AND HEMATOCRIT, BLOOD
HCT: 26.4 % — ABNORMAL LOW (ref 39.0–52.0)
Hemoglobin: 8.6 g/dL — ABNORMAL LOW (ref 13.0–17.0)

## 2013-10-06 NOTE — Progress Notes (Signed)
Patient ID: Edwin [name], male   DOB: 1956-06-17, 58 y.o.   MRN: [id number]   T patient is a 58 year old male with a complicated medical history including severe peripheral vascular disease his is an acute visit.  Level of care skilled.  Facility [facility].  Chief complaint-acute visit secondary to anemia-hyperkalemia -- pain.  History of present illness.  Patient is a 58 year old male with a complicated medical history including severe CAD and nonhealing ulcer of the right lower extremity that resulted in a right above-the-knee amputation.  Also a history of acute renal failure severe metabolic acidosis and hyperkalemia this was before he underwent the amputation.  Also a history of a GI bleed that required a transfusion in the hospital endoscopy showed  nonsteroid O. gastropathy versus gastric mucosal ischemia  He is on proton aches.  In regards to acute renal failure he was seen by nephrology he required dialysis before discharge and he's been started back on Lasix with a low dose ace.  Apparently he became hypokalemic before his discharge and potassium was started.  There appears his hemoglobin has dropped during his stay here --hemoglobin was 9.3 on February 27 as of yesterday had dropped to 8.4 an updated lab today it is down to 7.7.  Labs are also remarkable for a potassium of 5.8 he is on pretty aggressive potassium supplementation 40 mg twice a day as well as on an ACE inhibitor.  His Lasix was decreased from 40 a day to 20 mg a day yesterday.  Clinically he does not complaining of any presyncope shortness of breath chest pain or palpitations.  His main complaint is significant pain status post amputation and apparently a sacral wound which is followed by wound care.  He currently is receiving oxycodone 10 mg every 4 hours when necessary.  Family medical social history as been reviewed per admission note on 09/23/2013.  Medications have been reviewed per MAR.  Review of  systems.  Gen. no complaints of fever or chills.  Skin-does have a history apparently of sacral wound followed by nursing  Also left great toe and second toe wounds as well followed by nursing.  Respiratory no complaints of shortness of breath or cough.  Cardiac no chest pain.  GI-does not complaining of any nausea vomiting diarrhea constipation or abdominal discomfort says he occasionally does have gas.  Muscle skeletal does complain of joint pain with a history of PAD this is somewhat diffuse more so in the amputation site.  Neurologic does not complaining of dizziness headache or syncopal-type feelings.  Psych does not complaining of anxiety.  Physical exam.  Temperature is 97.6 pulse 94 respirations 20 blood pressure 95/66.  In general this is a frail-appearing middle-aged male in no distress sitting in his wheelchair.  Skin is warm and dry.  Oropharynx clear mucous membranes moist he has numerous extractions.  Chest is clear to auscultation no labored breathing.  Heart is regular rate and rhythm without murmur gallop or rub.  Abdomen soft nontender there are active bowel sounds.  Muscle skeletal he is right above-the-knee amputation surgical site does not appear to have any drainage bleeding or sign of infection otherwise general frailty but moves all his other extremities appears at baseline.  Neurologic appears grossly intact no lateralizing findings.  Labs.  10/06/2013.  Sodium 132 potassium 5.8 BUN 48 creatinine 1.17.  Liver function tests within normal limits except for an albumin of 1.6 which actually is an improvement from previous lab.  CBC 8.6 hemoglobin 7.7  platelets 522.  10/05/2013.  Hemoglobin was 8.4 platelets 594.  Assessment plan.  #1-anemia-with history of GI bleed-a GI consult has been ordered --this has been arranged for Tuesday of next week.  Will set up for transfusion of 2 units packed red blood cells.  Also update CBC on  Monday.  #2-hyperkalemia-Will give one dose of Kayexalate-also will DC the potassium for now.  Update a basic metabolic panel tomorrow and also on Monday.  #3-pain management-apparently the oxycodone does help but does not last long enough according to patient nursing staff-Will start OxyContin controlled release 10 mg twice a day and OxyIR 5 mg every 4 hours for breakthrough pain we may have to titrate this up but would like to give him a little more controlled release pain management and see if this helps  Of note this plan was discussed with Dr. Dellia Nims via phone.  [id number]

## 2013-10-07 ENCOUNTER — Encounter: Payer: Self-pay | Admitting: *Deleted

## 2013-10-07 ENCOUNTER — Encounter (HOSPITAL_COMMUNITY)
Admit: 2013-10-07 | Discharge: 2013-10-07 | Disposition: A | Discharge status: Home / Self Care | Payer: Medicaid Other | Attending: Internal Medicine | Admitting: Internal Medicine

## 2013-10-07 ENCOUNTER — Other Ambulatory Visit: Payer: Self-pay | Admitting: *Deleted

## 2013-10-07 MED ORDER — OXYCODONE HCL ER 10 MG PO T12A: EXTENDED_RELEASE_TABLET | ORAL | Status: DC

## 2013-10-07 MED ORDER — SODIUM CHLORIDE 0.9 % IV SOLN: Freq: Once | INTRAVENOUS | Status: DC

## 2013-10-07 NOTE — Progress Notes (Addendum)
Patient ID: Edwin [name], male   DOB: July 04, 1956, 58 y.o.   MRN: [id number]                  PROGRESS NOTE  DATE:  10/05/2013    FACILITY: [facility]    LEVEL OF CARE:   SNF   Acute Visit   CHIEF COMPLAINT:  Review of blood in stool, significant weight loss.    HISTORY OF PRESENT ILLNESS:  This is a man who was admitted to [place] after having undergone a right BKA.  He also had acute renal failure, GI bleed requiring 3 U of packed cells.  He was seen by GI, Dr. Cristina Gong.  He had an upper endoscopy showing multiple shallow mucosal ulcers, felt to be likely NSAID gastropathy.  He also had a sigmoidoscopy which showed several small punctate hemorrhages without evidence of ischemic colitis.   He was discharged on Protonix 40 b.i.d. for four weeks and then decreased to daily.    We have had several problems with this man since his arrival here.  Firstly, he appears to have had profound weight loss from 135 down to 104 pounds today.   The patient states he is eating.  He is on Lasix 40 b.i.d., although I am doubtful that he had this much edema.  I did not even comment on edema on my initial history and physical on him.    He  apparently also has been seen to have red blood in his stool.  The patient is not having abdominal pain, odynophagia, nausea or vomiting.  Although he denies diarrhea, the staff state that he is having three large loose stool movements per day.    REVIEW OF SYSTEMS:   GENERAL:  The patient states he is eating his meals.   CHEST/RESPIRATORY:  No shortness of breath.   CARDIAC:   No chest pain.   GI:  no dysphagia.  No odynophagia.  No abdominal pain.  Loose bowel movements as noted above, without frank bleeding per the patient although this appears to have been observed by the staff.  PHYSICAL EXAMINATION:  P-84 RR-18  GENERAL APPEARANCE:  The patient looks somewhat gaunt.  However, he is alert and orientated.  Not in Acute distress CARDIOVASCULAR:    CARDIAC:  Exam has been normal.   GASTROINTESTINAL:  LIVER/SPLEEN/KIDNEYS:  No liver, no spleen.  No tenderness.   GENITOURINARY:    PROSTATE:  His prostate is normal.   RECTAL:  There are no internal hemorrhoids.  Stool is intensely guaiac positive.    ASSESSMENT/PLAN:  OB positive stool.  I suppose this could still be remnants of his previous gastric ulceration.  Again, he is on Protonix.    Extreme weight loss.  We have gone through the usual concerns in long-term care.  He is on Lasix at 40 b.i.d.  His albumin is 1.1.  However, I did not make a lot of comment on edema.    On Lasix 40 b.i.d.  His sodium on 09/26/2013 was at 131.  I think we can drop the Lasix down to 40 a day.    A single stool for C.diff was negative on 09/24/2013.  However, we will repeat this.  We will also check stool for fecal fat.  I am going to monitor his CBCs carefully.  He is not on any antiplatelet drugs or anticoagulants.   A re- consultation with GI is indicated. If he continues to bleed a transfusion will likely be necessary.  Readmission to hospital also seems posssible  I also note that he has slight lidlag.  I will probably check his thyroid functions.    CPT CODE: 993_____

## 2013-10-07 NOTE — Telephone Encounter (Signed)
[facility] 

## 2013-10-08 ENCOUNTER — Inpatient Hospital Stay
Admission: RE | Admit: 2013-10-08 | Discharge: 2013-10-08 | Disposition: A | Discharge status: Skilled Nursing Facility | Payer: Medicaid Other | Source: Ambulatory Visit | Attending: Internal Medicine | Admitting: Internal Medicine

## 2013-10-08 DIAGNOSIS — K922 Gastrointestinal hemorrhage, unspecified: Secondary | ICD-10-CM | POA: Insufficient documentation

## 2013-10-08 DIAGNOSIS — K118 Other diseases of salivary glands: Secondary | ICD-10-CM

## 2013-10-08 DIAGNOSIS — R634 Abnormal weight loss: Secondary | ICD-10-CM | POA: Insufficient documentation

## 2013-10-08 DIAGNOSIS — R609 Edema, unspecified: Principal | ICD-10-CM

## 2013-10-08 LAB — TYPE AND SCREEN
ABO/RH(D): A POS
Antibody Screen: NEGATIVE
UNIT DIVISION: 0
Unit division: 0

## 2013-10-10 ENCOUNTER — Encounter: Payer: Self-pay | Admitting: Vascular Surgery

## 2013-10-11 ENCOUNTER — Encounter: Payer: Self-pay | Admitting: Vascular Surgery

## 2013-10-11 ENCOUNTER — Encounter: Payer: Self-pay | Admitting: Internal Medicine

## 2013-10-11 ENCOUNTER — Non-Acute Institutional Stay (SKILLED_NURSING_FACILITY): Payer: Medicaid Other | Admitting: Internal Medicine

## 2013-10-11 DIAGNOSIS — E871 Hypo-osmolality and hyponatremia: Secondary | ICD-10-CM

## 2013-10-11 DIAGNOSIS — K922 Gastrointestinal hemorrhage, unspecified: Secondary | ICD-10-CM

## 2013-10-11 DIAGNOSIS — D649 Anemia, unspecified: Secondary | ICD-10-CM

## 2013-10-11 DIAGNOSIS — M79609 Pain in unspecified limb: Secondary | ICD-10-CM

## 2013-10-11 NOTE — Progress Notes (Signed)
Patient ID: Edwin [name], male   DOB: 05-24-1956, 58 y.o.   MRN: [id number]   This is an acute visit.  Level of care skilled.  Facility [facility].  Chief complaint-acute visits secondary to pain management-anemia-hyponatremia.  History of present illness.  Patient is a pleasant 58 year old male with a complicated medical history with history of severe PAD and a nonhealing ulcer on his right lower extremity and subsequently underwent a right below the knee amputation secondary to this.  This was complicated with acute renal failure and hyperkalemia.  Also had a GI bleed that required transfusion endoscopy showed a nonsteroid gastropathy versus gastric mucosal ischemia.  His hemoglobin last week did drop to 7.7 and he was transfused 2 units of packed red blood cells--as of yesterday his hemoglobin was 10.2 --clinically he remained stable and he actually saw his GI doctor today.  Who feels it would be best to just continually observe for now and monitor his hemoglobin carefully.  He is on iron.  Recommendation to consider endoscopy and possibly colonoscopy when he is a bit stronger clinically  Today has no complaints other than he would like his pain medication switch back to what it was before oxycodone 10 mg every 4 hours when necessary last week it was switched to oxycodone continued release 10 mg twice a day and OxyIR 5 mg for breakthrough pain however he feels the previous medication was more effective with the oxycodone 10 mg every 4 hours when necessary  Patient also has mild hyponatremia her recent lab and appears this is baseline at 132 Mrs. comparable with the levels last week as we'll continue to monitor his Lasix was decreased last week to 20 mg a day.  Another issue was an elevated potassium of 5.8 last week he did receive a dose of Kayexalate and potassium as of yesterday was 4.7.  Family medical social history has been reviewed per admission note on 09/23/2013.  Medications  have been reviewed per MAR.  Review of systems.  General denies any fever chills seems to be feeling better.  Skin does have a history of a sacral wound this is being monitored by wound care and apparently stable.  Respiratory no complaint of shortness of breath or cough.  Cardiac no chest pain no edema.  GI does not complaining of any abdominal discomfort nausea vomiting diarrhea or constipation.  Muscle skeletal complains of somewhat diffuse pain or swelling the amputation site right lower extremity and back sacral area.  Neurologic no complaints of headache or dizziness.  Psych is not complaining of anxiety or depressive symptoms appears to be upbeat today.  Physical exam.  Temperature is 98.3 pulse 92 respirations 20 blood pressure 110/76 O2 saturation 90% on room air.  In general this is a frail middle-aged male who looks somewhat older than his stated age.  His skin is warm and dry sacral area was not assessed today per nursing this is stable and monitored closely-surgical site right stump area appears benign without any drainage bleeding appears to be healing unremarkably there is no surrounding erythema.  Chest is clear to auscultation no labored breathing.  Heart is regular rate and rhythm without murmur gallop or rub.  Abdomen soft nontender positive bowel sounds.  Musculoskeletal moves all extremities x4 does have right below the knee amputation in general catharticsfrailty  Neurologic-grossly intact speech is clear no focal deficits.  Psych alert and oriented x3 pleasant and appropriate.  Labs.  10/10/2013.  WBC 7.1 hemoglobin 10.2 platelets 366.  Sodium 132 potassium 4.7 BUN 28 creatinine 0.75 assessment and plan.  #1-anemia-this appears to have improved status post transfusion he did see GI doctor today who is recommended monitoring of his hemoglobin will recheck this next week clinically he appears quite stable-he continues on iron.  #2 pain management  as per patient request will switch back to oxycodone 10 mg every 4 hours when necessary apparently the controlled release did not help much according to him Will continue to monitor would not rule out going back controlled-release at a higher dose but will see how effective the oxycodone 10 every 4 hours e is he seems to prefer this  #3 hyperkalemia-this appears to have stabilized-we will have to monitor this and we'll recheck this first lab day next week as well  #4-hyponatremia this appears to be mild Lasix was decreased last week again we'll update this next week as well.  [id number] note greater than 25 minutes when assessing patient-reviewing his medical records-discussing his status with nursing staff-and coordinating and formulating a plan of care for numerous diagnoses-of note greater than 50% of time spent coordinating plan of care  .

## 2013-10-18 ENCOUNTER — Encounter: Payer: Self-pay | Admitting: Family

## 2013-10-18 ENCOUNTER — Ambulatory Visit: Payer: Medicaid Other | Admitting: Family

## 2013-10-18 ENCOUNTER — Encounter: Payer: Self-pay | Admitting: Vascular Surgery

## 2013-10-18 ENCOUNTER — Other Ambulatory Visit: Payer: Self-pay | Admitting: *Deleted

## 2013-10-18 VITALS — BP 95/68 | HR 100 | Temp 97.8°F | Resp 18 | Ht 68.0 in | Wt 111.0 lb

## 2013-10-18 DIAGNOSIS — Z4802 Encounter for removal of sutures: Secondary | ICD-10-CM

## 2013-10-18 MED ORDER — OXYCODONE HCL 10 MG PO TABS: ORAL_TABLET | ORAL | Status: DC

## 2013-10-18 NOTE — Telephone Encounter (Signed)
[facility] 

## 2013-10-26 ENCOUNTER — Non-Acute Institutional Stay (SKILLED_NURSING_FACILITY): Payer: Medicaid Other | Admitting: Internal Medicine

## 2013-10-26 ENCOUNTER — Encounter: Payer: Self-pay | Admitting: Internal Medicine

## 2013-10-26 DIAGNOSIS — E43 Unspecified severe protein-calorie malnutrition: Secondary | ICD-10-CM

## 2013-10-26 DIAGNOSIS — M109 Gout, unspecified: Secondary | ICD-10-CM

## 2013-10-26 DIAGNOSIS — R634 Abnormal weight loss: Secondary | ICD-10-CM

## 2013-10-26 DIAGNOSIS — D649 Anemia, unspecified: Secondary | ICD-10-CM

## 2013-10-26 NOTE — Progress Notes (Signed)
Patient ID: Edwin [name], male   DOB: 16-Jan-1956, 58 y.o.   MRN: [id number]   This is an acute visit.  Level of care skilled.  Facility [facility].  Chief complaint-acute visit secondary to left great toe pain.  History of present illness.  Patient is a pleasant 58 year old male with a complicated medical history including recent right BKA-also a history of acute renal failure and GI bleed requiring transfusion.  He subsequently as needed a transfusion here as well-GI workup apparently will be done when he gains more strength.  He continues on Protonix twice a day.  Most acute issue today is a painful area at the base of his left great toe at the joint-he denies any history of trauma recently to the area.  He does receive oxycodone 10 mg every 4 hours for pain and takes this quite frequently.  Regards to anemia he is oniron as well as Protonix twice a day most recent hemoglobin done today was 9.8 this is relatively baseline with the lab  2 weeks ago.  Clinically appears to be stable has gained a bit of weight says earlier in March apparently is eating quite well he is on Remeron for stimulation of his appetite.  Vital signs are stable.  Family medical social history has been reviewed per admission note on the bili 27 2015.  Medications have been reviewed per MAR.  Review of systems.  In general denies any fever or chills.  Respiratory no complaints of shortness of breath or cough.  Cardiac no chest pain.  GI does not complaining of any abdominal pain diarrhea nausea or vomiting had had some diarrhea earlier in his stay.  Extremities complains at times of pain here again takes oxycodone quite frequently he is complaining more specifically of this pain at the base of his left great toe today.  Physical exam.  Temperature is 96.9 pulse 92 respirations 18 blood pressure 117/72 weight is 109.6 this is a gain of about 5 pounds the past 3 weeks.  In general this is a  frail-appearing middle-age male in no distress sitting up in his wheelchair.  His skin is warm and dry.  Chest has somewhat reduced air entry with some crackles. this appears to be relatively baseline no labored breathing  Heart-is regular rate and rhythm without murmur gallop or rub.  Muscle skeletal he is status right AKA.  Left leg does have some muscle wasting which is not new at the lateral base of his left great toe there is a small area of erythema that is raised and painful to touch this is quite localized and does not appear to be cellulitis appears to be a gout-like presentation  I do not see any other affected joints.  Labs.  10/26/2013.  WBC 5.0 hemoglobin 9.8 platelets 408.  Sodium 133 potassium 4.9 BUN 34 creatinine 0.91.   10/10/2013.  Hemoglobin 10.2 place.  Sodium 132 potassium 4.7 BUN 28 creatinine 0.75  Assessment and plan.  #1-left foot pain  New problem-at this point would be suspicious of gout will order a uric acid and prophylactically start colchicine 0.6 mg twice a day to see if this will give him some relief Will await lab results.--And monitor for any changes  #2-anemia-this appears relatively at baseline again he does have GI followup scheduled as at this point recommendation is to monitor his hemoglobin will recheck this next week-clinically he appears stable continues on iron as well as Protonix.  #3 failure to thrive-patient actually slowly gaining weight  according to nursing staff he is eating quite well this is encouraging although again he continues to be very frail and will have to be monitored he is on Remeron for appetite stimulation  [phone]

## 2013-11-12 ENCOUNTER — Non-Acute Institutional Stay (SKILLED_NURSING_FACILITY): Payer: Medicaid Other | Admitting: Internal Medicine

## 2013-11-12 DIAGNOSIS — G8929 Other chronic pain: Secondary | ICD-10-CM

## 2013-11-12 DIAGNOSIS — D649 Anemia, unspecified: Secondary | ICD-10-CM

## 2013-11-12 DIAGNOSIS — T8789 Other complications of amputation stump: Secondary | ICD-10-CM

## 2013-11-12 DIAGNOSIS — M109 Gout, unspecified: Secondary | ICD-10-CM

## 2013-11-12 NOTE — Progress Notes (Signed)
Patient ID: Edwin [name], male   DOB: January 08, 1956, 58 y.o.   MRN: [id number]   This is an acute visit.  Level of care skilled.  Facility [facility] .  Chief complaint-acute visit O. right stump issues-anemia-hyponatremia   History of present illness.  Patient is a pleasant 58 year old male with a complicated medical history with history of severe PAD and a nonhealing ulcer on his right lower extremity and subsequently underwent a right below the knee amputation secondary to this.  This was complicated with acute renal failure and hyperkalemia.  Also had a GI bleed that required transfusion endoscopy showed a nonsteroid gastropathy versus gastric mucosal ischemia.  His hemoglobin  did drop to 7.7 and he was transfused 2 units of packed red blood cells--hemoglobin did rise to 10.2.  He is followed by GI.  Who feels it would be best to just continually observe for now and monitor his hemoglobin carefully.  He is on iron--most recent t hemoglobin is 8.9 we will update this.  Recommendation to consider endoscopy and possibly colono  scopy when he is a bit stronger clinically He continues to gain strength and he is eating better his weight is stable to slowly increasing.    Patient does have an area to his right stump with a small wound I been asked to look at this tonight apparently appears a scab came off He also did receive a short course of colchicine recently secondary to concerns of gout of his left foot with some tenderness of the left great toe-this appears resolved he is no longer receiving colchicine   Patient also does have a history of renal failure-with elevated potassium at times most recent potassium was 4.9 on April 8 we will update this.  Another issue is been mild hyponatremia which we are following this well.  He is on Lasix   Family medical social history has been reviewed per admission note on 09/23/2013 .  Medications have been reviewed per MAR.   Review of systems.    General denies any fever chills seems to be feeling better.  Skin does have a history of a sacral wound this is being monitored by wound care and apparently stable. He also has a small right stump area that is  healing apparently there's been some scabbing that came off  Respiratory no complaint of shortness of breath or cough.  Cardiac no chest pain no edema.  GI does not complaining of any abdominal discomfort nausea vomiting diarrhea or constipation.  Muscle skeletal -does not complaining of pain this evening apparently had been an issue earlier in his stay  Neurologic no complaints of headache or dizziness.  Psych is not complaining of anxiety or depressive symptoms appears to be upbeat today .  Physical exam.  Temperature 98.7 pulse 91 respirations 21 blood pressure 109/64 weight is 112.  In general this is a frail middle-aged male who looks somewhat older than his stated age.  His skin is warm and dry sacral area was not assessed today y-surgical site right stump area appears fairly unremarkable it does appear small amount of scabbing his come off leaving a somewhat small open area this does not extend very deep whatsoever and will have to be monitored however and protected with dressing there is no drainage bleeding erythema or sign of infection here.  Chest is largely clear to auscultation-there is a minimal amount of expiratory wheezing at the bases-certainly no labored breathing.  Heart is regular rate and rhythm without murmur gallop  or rub.  Abdomen soft nontender positive bowel sounds.  Musculoskeletal moves all extremities x4 does have right below the knee amputation in general catharticsfrailty--he does have some dressing to his toes on lower left foot this is followed by wound care and apparently did not show any signs of infection-also area under his left great toe is not red or painful tonight  Neurologic-grossly intact speech is clear no focal deficits.  Psych alert and  oriented x3 pleasant and appropriate.   Labs.  11/02/2013.  WBC 4.7 hemoglobin 8.9 platelets 250.  Sodium 132 potassium 4.9 BUN 39 creatinine 0.96.  10/27/2013.  Uric acid 5.4 10/10/2013.  WBC 7.1 hemoglobin 10.2 platelets 366.  Sodium 132 potassium 4.7 BUN 28 creatinine 0.75 assessment and plan.   #1-anemia- This is followed by GI with recommendation to monitor hemoglobin we'll recheck this  clinically he appears stable he is on iron  #2 pain management -- per patient request was switched  back to oxycodone 10 mg every 4 hours when necessary apparently the controlled release did not help much--he appears to be stable in this regard   #3 hyperkalemia-this appears to have stabilized-we will have to monitor this and we'll recheck this first lab day next week as well  #4-hyponatremia this appears to be mild --will recheck this.  #5-history of right stump issues-at this point area looks quite benign we'll have to monitor r and clean with normal saline there are already orders for this monitor for any changes  #6-question gout-uric acid was not really elevated  None the less he appears however to have responded to a short course of colchicine this appears improved   [id number]-of note greater than 25 minutes when assessing patient-reviewing his medical records-discussing his status with nursing staff-and coordinating and formulating a plan of care for numerous diagnoses-of note greater than 50% of time spent coordinating plan of care  .

## 2013-11-13 ENCOUNTER — Encounter: Payer: Self-pay | Admitting: Internal Medicine

## 2013-11-15 ENCOUNTER — Non-Acute Institutional Stay (SKILLED_NURSING_FACILITY): Payer: Medicaid Other | Admitting: Internal Medicine

## 2013-11-15 ENCOUNTER — Encounter: Payer: Self-pay | Admitting: Internal Medicine

## 2013-11-15 ENCOUNTER — Other Ambulatory Visit: Payer: Self-pay | Admitting: *Deleted

## 2013-11-15 DIAGNOSIS — E871 Hypo-osmolality and hyponatremia: Secondary | ICD-10-CM

## 2013-11-15 DIAGNOSIS — H6120 Impacted cerumen, unspecified ear: Secondary | ICD-10-CM

## 2013-11-15 DIAGNOSIS — J449 Chronic obstructive pulmonary disease, unspecified: Secondary | ICD-10-CM

## 2013-11-15 DIAGNOSIS — D649 Anemia, unspecified: Secondary | ICD-10-CM

## 2013-11-15 MED ORDER — OXYCODONE HCL 10 MG PO TABS: ORAL_TABLET | ORAL | Status: DC

## 2013-11-15 NOTE — Progress Notes (Signed)
Patient ID: Edwin [name], male   DOB: 1955-09-10, 58 y.o.   MRN: [id number]   This is an acute visit.  Level of care skilled.  Facility [facility].  Chief complaint-acute visit secondary to left ear discomfort.  History of present illness.  Patient is a pleasant 58 year old male who is complaining of left ear discomfort.  Apparently is also had some decreased hearing he denies any fever chills or systemic issue.  Also denies any shortness of breath or cough.  Family medical social history has been reviewed per admission note on 09/23/2013.  Medications have been reviewed per MAR.  Review of systems.  In general no complaints of fever or chills.  Respiratory no complaints of cough does have a history of COPD does not complaining of shortness of breath.  Ears-complains of some left ear discomfort.  Cardiac-no complaints of chest pain or palpitations.  Physical exam.  Temperature is 97.2 pulse 82 respirations 20 pressure 95/64.  In general this is a pleasant middle-aged male in no distress sitting comfortably in his wheelchair.  His skin is warm and dry.  Chest he does have somewhat diffuse coarse breath sounds there is no labored breathing-this clears somewhat with cough.  Heart-is regular rate and rhythm without murmur gallop or rub.  Head ears eyes nose mouth and throat-right ear tympanic membrane is pearly-gray and well-visualized no sign of erythema or edema minimal wax.  Left ear tympanic membrane could not be visualized secondary to significant amount of wax occluding the area I do not see any drainage bleeding erythema however.  Labs.  April 22,015.  WBC 5.2 hemoglobin 8.4 platelets 295.  Sodium 1:30 potassium 4.5 BUN 44 creatinine 1.19.  Assessment and plan.  #1-cerumen impactionWill treat with theDebrox drops twice a day x3 days and flushed with warm water on day 4  the left ear------ right ear appears to be quite clear--monitor for resolution this appears  to be significant impaction he may need ENT  if conservative treatment is not effective  #2-anemia history of GI bleed-this is followed by GI with recommendation to monitor serial hemoglobins hemoglobin is gradually trending down although appears to be fairly slow Will update a CBC next week--he is on iron.  #3-hyponatremia this appears to be chronic we'll update this as well next week.  #4- he does have a history of COPD but denies any shortness of breath or or cough-continue to monitor he has when necessary nebulizers.  [id number]

## 2013-11-15 NOTE — Telephone Encounter (Signed)
[facility] 

## 2013-11-16 ENCOUNTER — Non-Acute Institutional Stay (SKILLED_NURSING_FACILITY): Payer: Medicaid Other | Admitting: Internal Medicine

## 2013-11-16 DIAGNOSIS — E43 Unspecified severe protein-calorie malnutrition: Secondary | ICD-10-CM

## 2013-11-16 DIAGNOSIS — K922 Gastrointestinal hemorrhage, unspecified: Secondary | ICD-10-CM

## 2013-11-16 DIAGNOSIS — L89309 Pressure ulcer of unspecified buttock, unspecified stage: Secondary | ICD-10-CM

## 2013-11-21 NOTE — Progress Notes (Signed)
Patient ID: Edwin [name], male   DOB: July 30, 1955, 59 y.o.   MRN: [id number]                  PROGRESS NOTE  DATE:  11/16/2013    FACILITY: [facility]    LEVEL OF CARE:   SNF   Acute Visit   HISTORY OF PRESENT ILLNESS:  This man is a patient whom I admitted here at the end of February.  He had presented with gangrene in his right foot, ultimately requiring a right above-knee amputation.  He has severe PAD.    He also had an upper GI bleed, acute renal failure, and severe protein calorie malnutrition.    He came here with wounds on his coccyx as well as areas over his ischium.  The areas over his ischium have healed.  However, the coccyx wound had been doing well and had healthy-looking granulation up until a week ago, per the wound care team.  However, this has deteriorated with some drainage and I am seeing this today, as well.    PHYSICAL EXAMINATION:   SKIN:  INSPECTION:  The area over his coccyx is about the size of a dime.  However, this has undermining.  There is drainage and odor.  I have done a culture and we will start him on empiric antibiotics, probably doxycycline.     In the left foot, there are two wounds, one at the tip of the left great toe and one on the side of the second toe in the first/second web interspace.  I have done a mechanical debridement of surface eschar of both of these areas.  These will need Santyl-based dressings.    ASSESSMENT/PLAN:  Pressure ulcer over the coccyx.  This has deteriorated.  A culture was done.  Silver alginate dressings for now with foam covering.    Wounds on his first and second toes on the left, which are probably related to severe PAD.   Will need Santyl-based dressings here.    Anemia.  His hemoglobin is 8.8.  This was, I think, stable.  However, dropped down to 8.4 on 11/14/2013 and repeated today at 8.8.  I suspect he still might be losing hemoglobin.    He had severe protein calorie malnutrition at one point,  although I do not see actual indices.    CPT CODE: 07371

## 2013-11-24 ENCOUNTER — Non-Acute Institutional Stay (SKILLED_NURSING_FACILITY): Payer: Medicaid Other | Admitting: Internal Medicine

## 2013-11-24 ENCOUNTER — Encounter: Payer: Self-pay | Admitting: Internal Medicine

## 2013-11-24 DIAGNOSIS — D649 Anemia, unspecified: Secondary | ICD-10-CM

## 2013-11-24 DIAGNOSIS — I509 Heart failure, unspecified: Secondary | ICD-10-CM

## 2013-11-24 DIAGNOSIS — R609 Edema, unspecified: Secondary | ICD-10-CM | POA: Insufficient documentation

## 2013-11-24 DIAGNOSIS — H6120 Impacted cerumen, unspecified ear: Secondary | ICD-10-CM

## 2013-11-24 DIAGNOSIS — M109 Gout, unspecified: Secondary | ICD-10-CM

## 2013-11-24 NOTE — Progress Notes (Signed)
Patient ID: MONTEZ CUDA, male   DOB: [birth year], 58 y.o.   MRN: [id number]    . This is an acute visit  Level of care skilled.  Facility [facility].  Chief complaint acute visit secondary to multiple issues including left foot discomfort-edema-continued left ear issues.  History of present illness.  Patient is a pleasant 58 year old male who is complaining of some increased left foot discomfort-he says this is similar to the discomfort he had several weeks ago when he had possibly a gout flare-he did respond to a course of colchicine.  He does have some history of edema lower extremities and is on low dose Lasix--he does have a history of grade 1 diastolic dysfunction per echo back in February.  He does not complaining of any shortness of breath he is on nebulizers with a history of COPD.  He also has had an impaction of his left ear-he has completed a course of the Brox says this is doing a bit better but still feels plugged.  Family medical social history has been reviewed per admission note on 02/20/2014.  Medications have been reviewed per MAR.  Review of systems.  In general does not complaining of any fever or chills.  Respiratory no complaints of shortness of breath says his cough is actually better.  Cardiac no complaints of chest pain.  GI no complaints of abdominal pain nausea vomiting diarrhea or constipation.  Muscle skeletal not complaining of pain does have a history of severe PAD status post right below the knee amputation.  Neurologic does not complaining of any dizziness headache or numbness.  Physical exam.  Temperature is 97.2 pulse 87 respirations 20 blood pressure 118/71.--Weight 112  In general this is a pleasant middle-age male in no distress resting comfortably in bed.  His skin is warm and dry I do note he had a small open area on his stump area on the right this appears to be largely resolved he did get a course of doxycycline  I also note he  appears to have a cyst under his right ear he says he has had this for years but he feels is getting somewhat larger--it is not red erythematous or acutely tender Left ear canal istill cannot see the tympanic membrane although they wax appears to be somewhat reduced from previous exam right ear appears fairly unremarkable with a minimal amount of wax tympanic membrane is pearly-gray and visualized.  Chest he does have somewhat coarse breath sounds on expiration back clear fairly significantly with cough there is no labored breathing.   heart is regular rate and rhythm without murmur gallop or rub.  Abdomen soft nontender positive bowel sounds.  Muscle skeletal  Status post below the knee amputation surgical site again appears fairly unremarkable this evening.  On the left he does have 1+ edema of his left foot the pedal pulse is intact there is some tenderness around the base of the first toe-there is some mild erythema around here as well  Neurologic-appears grossly intact no lateralizing findings speech is clear.  Psych he is alert and oriented x3 pleasant and appropriate  Labs.  11/21/2013.  WBC 5.1 hemoglobin 7.8 platelets 332.  Sodium 135 potassium 4.4 BUN 37 creatinine 0.95--  Assessment and plan   . #1-left foot discomfort with edema-apparently he responded well to a course of colchicine previously although uric acid was not grossly elevated we'll update a uric acid level and restart colchicine for now and monitor clinically.  Also with the edema  will order a venous Doppler to rule out any clot.  Also  monitor for any increased erythema    2-cerumen impaction-appears toDebrox helped somewhat although he has a significant impaction here-we'll repeat a course ofDebrox for 3 days-if impaction persists suspect we will have to involve ENT  #3-what appears to be a cyst under the right ear-apparently this has been chronic but he feels is getting somewhat larger-we will monitor  this he may need a dermatology consult but also will have Dr. Dellia Nims look at this-- it does not appear to be infected or acutely tender  #4-history of anemia with occult positive stools-this is followed by GI-they have deferred aggressive workup until he is somewhat stronger-for now orders are to monitor hemoglobin will recheck this next week--he is on a PPI as well as iron  #3-FTDDUKG of CHF-diastolic-he is on low dose Lasix-his weight appears to have been stable during this month around 112 pounds will order weights twice a week  notify provider of gain greater than 3 pounds   [id number]

## 2013-11-25 ENCOUNTER — Ambulatory Visit (HOSPITAL_COMMUNITY)
Admit: 2013-11-25 | Discharge: 2013-11-25 | Disposition: A | Discharge status: Home / Self Care | Payer: Medicaid Other | Source: Ambulatory Visit | Attending: Internal Medicine | Admitting: Internal Medicine

## 2013-11-25 DIAGNOSIS — M7120 Synovial cyst of popliteal space [Baker], unspecified knee: Secondary | ICD-10-CM | POA: Insufficient documentation

## 2013-11-25 DIAGNOSIS — S88919A Complete traumatic amputation of unspecified lower leg, level unspecified, initial encounter: Secondary | ICD-10-CM | POA: Insufficient documentation

## 2013-11-25 DIAGNOSIS — R609 Edema, unspecified: Secondary | ICD-10-CM | POA: Insufficient documentation

## 2013-11-26 ENCOUNTER — Non-Acute Institutional Stay (SKILLED_NURSING_FACILITY): Payer: Medicaid Other | Admitting: Internal Medicine

## 2013-11-26 DIAGNOSIS — M109 Gout, unspecified: Secondary | ICD-10-CM

## 2013-11-26 DIAGNOSIS — I509 Heart failure, unspecified: Secondary | ICD-10-CM

## 2013-11-26 DIAGNOSIS — H6120 Impacted cerumen, unspecified ear: Secondary | ICD-10-CM

## 2013-11-26 DIAGNOSIS — R609 Edema, unspecified: Secondary | ICD-10-CM

## 2013-11-26 NOTE — Progress Notes (Signed)
Patient ID: Edwin [name], male   DOB: [birth year], 58 y.o.   MRN: [id number]   This is an acute visit  Level of care skilled.  Facility [facility].   Chief complaint acute visit to followup left foot edema-left ear wax impaction .  History of present illness.  Patient is a pleasant 58 year old male who was complaining of some increased left foot discomfort-he says this is similar to the discomfort he had several weeks ago when he had possibly a gout flare-he did respond to a course of colchicine.--This is been restarted and he says the pain is better controlled today along with the edema-we also ordered a venous Doppler that was negative for a DVT Of note a uric acid returned at high normal at 6.6.    He does have some history of edema lower extremities and is on low dose Lasix--he does have a history of grade 1 diastolic dysfunction per echo back in February.  He does not complain of any shortness of breath he is on nebulizers with a history of COPD.  He also has had an impaction of his left ear-he has completed a course of de Brox is still some wax appeared to remain he has been started on another course-he says this is doing a bit better he feels like some wax came out a couple days ago-he says at times ear feels like it's clearing up and then he changes position  Of his head and plugs up again.   Family medical social history has been reviewed per admission note on 02/20/2014.   Medications have been reviewed per MAR .  Review of systems.  In general does not complaining of any fever or chills. Head ears eyes nose mouth and throat-as noted he is complaining of some ear impaction on the left but does not complaining of any pain  Respiratory no complaints of shortness of breath says his cough continues to improve.  Cardiac no complaints of chest pain.  GI no complaints of abdominal pain nausea vomiting diarrhea or constipation.  Muscle skeletal not complaining of pain does have a history of  severe PAD status post right below the knee amputation--says discomfort in his left foot is improved.  Neurologic does not complaining of any dizziness headache or numbness .  Physical exam.  Temperature 97.5 pulse 80 respirations 20 blood pressure 121/74.--Weight 112  In general this is a pleasant middle-age male in no distress resting comfortably in bed.  His skin is warm and dry I do note he had a small open area on his stump area on the right this appears to be largely resolved he did get a course of doxycycline    Left ear canal istill cannot see the tympanic membrane although they wax appears to be somewhat reducedand softer appearing from previous exam right ear appears fairly unremarkable with a minimal amount of wax tympanic membrane is pearly-gray and visualized--note left ear in ear canal appears to be some irritation I suspect this is from manual stimulation possibly from patient manipulating the wax.  Chest he does have somewhat coarse breath sounds on expiration back clear fairly significantly with cough there is no labored breathing.  heart is regular rate and rhythm without murmur gallop or rub.  Abdomen soft nontender positive bowel sounds.  Muscle skeletal Status post below the knee amputation surgical site again appears fairly unremarkable this evening.  On the left he does have 1+ edema of his left foot the pedal pulse is intact there is  some  minimal tenderness around the base of the first toe-there is some mild erythema around here as well-this appears improved somewhat from previous exam-  Neurologic-appears grossly intact no lateralizing findings speech is clear.  Psych he is alert and oriented x3 pleasant and appropriate   Labs.  Uric acid noted to be 6.6  11/21/2013.  WBC 5.1 hemoglobin 7.8 platelets 332.  Sodium 135 potassium 4.4 BUN 37 creatinine 0.95--   Assessment and plan  . #1-left foot discomfort with edema- This appears to be improving somewhat with less  erythema and less edema and pain-he is on a course of colchicine Will continue to monitor  2-cerumen impaction-this appears to be improving on a second course of Diamox-we'll have to keep an eye on this appears there is some irritation in the left ear canal this will have to be monitored as well he's not really complaining of any pain or discharge     #3-history of anemia with occult positive stools-this is followed by GI-they have deferred aggressive workup until he is somewhat stronger-for now orders are to monitor hemoglobin will recheck this next week--he is on a PPI as well as iron   #0-QQPYPPJ of CHF-diastolic-he is on low dose Lasix-his weight appears to have been stable during this month around 112 pounds--clinically appears stable   [id number]

## 2013-11-27 ENCOUNTER — Encounter: Payer: Self-pay | Admitting: Internal Medicine

## 2013-11-29 ENCOUNTER — Encounter: Payer: Self-pay | Admitting: Internal Medicine

## 2013-11-29 ENCOUNTER — Non-Acute Institutional Stay (SKILLED_NURSING_FACILITY): Payer: Medicaid Other | Admitting: Internal Medicine

## 2013-11-29 DIAGNOSIS — R609 Edema, unspecified: Secondary | ICD-10-CM

## 2013-11-29 DIAGNOSIS — R635 Abnormal weight gain: Secondary | ICD-10-CM | POA: Insufficient documentation

## 2013-11-29 DIAGNOSIS — M109 Gout, unspecified: Secondary | ICD-10-CM

## 2013-11-29 DIAGNOSIS — I509 Heart failure, unspecified: Secondary | ICD-10-CM

## 2013-11-29 NOTE — Progress Notes (Signed)
Patient ID: Edwin [name], male   DOB: 1956/07/07, 58 y.o.   MRN: [id number]   This is an acute visit.  Level care skilled.  Facility [facility] [place] [facility].  Chief complaint-acute visit secondary to left foot edema.  History of present illness.  Patient is a pleasant middle-age male who we have seen several times recently for various issues.  Most recent issue with some increased left foot discomfort and edema-he responded previously to a course of colchicine and this appears to be the case again even though uric acid is within normal limits although the higher limits of normal.  He continues to have some left foot edema although this is less painful and erythematous rash of note we did order a venous Doppler that was negative for DVT.  He does have a history of diastolic CHF is on low dose lisinopril as well as Lasix 20 mg a day.  He has gained weight he appears he has not had a weight done in a couple weeks but it was 120 yesterday back in April 23 it was 112.  He has a very good appetite especially last 2 weeks according to nursing he is on Remeron for appetite stimulation.  He does not complaining of any shortness of breath cough or acute pain in his left foot in fact he says it feels a bit better he continues with edema however  Family medical social history has been reviewed permission note on 09/23/2013.  Medications have been reviewed.  Per MAR.  Review of systems.  In general no complaints fever chills.  Respiratory no shortness of breath or cough.  Cardiac is not complaining of chest pain has left foot edema appears relatively at baseline.  GI does not complain of nausea or vomiting or abdominal pain has a very good appetite.  Urological complaints of headache or dizziness.  Physical exam.  Temperature is 98.1 pulse 80 respirations 19 blood pressure 110/70-machine said 94/61 but I took itt manually O2 saturation is 97% on room air weight is 120.4  In general this is a  frail-appearing middle-aged male in no distress lying comfortably in bed.  His skin is warm and dry.  Chest is clear to auscultation no labored breathing.  Somewhat shallow air entry.  Heart is regular rate and rhythm without murmur gallop or rub.  He continues with 2+ edema of his left foot this is however less erythematous and less tender than previous exams he does have dressing of his toes this is followed by wound care and apparently these are doing well.  He does not have any sacral edema.  Labs.  11/28/2013.  WBC 4.5 hemoglobin 7.8 platelets 287.  11/21/2013.  Sodium 135 potassium 4.4 BUN 37 creatinine 0.95.  Assessment and plan.  #1-left foot edema-this appears relatively baseline he has not been elevating this much he did have it elevated this evening in says he will continue to try to do this more.  Respiratory exam appears quite benign well update a BNP  #2-history CHF-continues on Lasix and low-dose lisinopril this appears clinically stable he does have the left foot edema-we have to encourage leg elevation again will order a BNP.  He does not complaining of any shortness of breath O2 sats are satisfactory.  #3-weight gain-I suspect a better appetite is contributing to this apparently he eats very well according to nursing staff-also will have a \\re  weight thinking there may be some scale variation here continue to monitor weights 2 times a week  #4-- anemia  with history of occult positive stool-this is followed by GI- at this point plan is to monitor hemoglobins he continues on PPI as well as iron supplementation-GI would like him to be a bit stronger before pursuing aggressive measures  We'll update CBC next week  [id number]       [id number]  11/25/2013.  Uric acid 6.6.

## 2013-12-01 ENCOUNTER — Non-Acute Institutional Stay (SKILLED_NURSING_FACILITY): Payer: Medicaid Other | Admitting: Internal Medicine

## 2013-12-01 DIAGNOSIS — I509 Heart failure, unspecified: Secondary | ICD-10-CM

## 2013-12-01 DIAGNOSIS — M109 Gout, unspecified: Secondary | ICD-10-CM

## 2013-12-01 DIAGNOSIS — R609 Edema, unspecified: Secondary | ICD-10-CM

## 2013-12-01 NOTE — Progress Notes (Signed)
Patient ID: Edwin [name], male   DOB: 09-20-1955, 59 y.o.   MRN: [id number]   This is an acute visit.  Level care skilled.  Facility [facility] .  Chief complaint-acute visit secondary to BNP results--followup suspected gout question pseudogout   History of present illness.  Patient is a pleasant middle-age male who we have seen several times recently for various issues.  Most recent issue with some increased left foot discomfort and edema-he responded previously to a course of colchicine and this appears to be the case again even though uric acid is within normal limits although the higher limits of norma--l.  He continues to have some left foot edema although this is less painful and erythematous --of note we did order a venous Doppler that was negative for DVT--when I saw him earlier this week I did mention trying to elevate his leg as much as possible and edema actually appears to be somewhat improved today.  He does have a history of diastolic CHF is on low dose lisinopril as well as Lasix 20 mg a day.  He has gained weight he appears he had not had a weight done in a couple weeks but it was 120.4 earlier this week back on April 23 it was 112--I ordered a weightt for today and apparently it was obtainedt l after lunch and it is 122.2-although I suspect this is not a true comparison since he had eaten. I also ordered a BNP which has come back elevated at just over 900--  He has a very good appetite especially last 2 weeks according to nursing he is on Remeron for appetite stimulation.  He does not complaining of any shortness of breath the pain in his left foot again continues to be improving apparently has pretty much disappeared   Family medical social history has been reviewed permission note on 09/23/2013 .  Medications have been reviewed.   Per MAR.  Review of systems.  In general no complaints fever chills.  Respiratory no shortness of breath or cough.  Cardiac is not complaining of  chest pain has left foot edema appears relative.  GI does not complain of nausea or vomiting or abdominal pain has a very good appetite.  Marland Kitchen  Physical exam.  Temperature 97.3 pulse 88 respirations 20 blood pressure 93/59-127/78 in this range weight 122.2 again this was an after lunch weight--O2 sats are in the 90s on room air  In general this is a frail-appearing middle-aged male in no distress lying --he does appear to be getting stronger.  His skin is warm and dry.  Chest no labored breathing there are some bronchial sounds small amount of inspiratory wheezing that clears with cough.  Somewhat shallow air entry which is baseline.  Heart is regular rate and rhythm without murmur gallop or rub.  He continues with 1-2+ edema of his left foot--this continues to improve   He does not have any sacral edema .  Labs.  11/28/2013.  WBC 4.5 hemoglobin 7.8 platelets 287.  11/21/2013.  Sodium 135 potassium 4.4 BUN 37 creatinine 0.95.   Assessment and plan.  #1-left foot edema--- this appears to be somewhat improved I suspect he is elevating his leg a bit more  Also is significantly less tender than when I first evaluated will decrease his colchicine to once a day and ifthis continues to improve consider discontinuing colchicine at some point  #2-history CHF-continues on Lasix and low-dose lisinopril this appears clinically stable he does have the left foot  edema-- this appears somewhat improved I suspect he is elevating his leg more.  He does not complaining of any shortness of breath O2 sats are satisfactory--his weight appears minimally increased from 2 days ago although the weight was taken after lunch I suspect this is not a true comparison and will have him reweighed tomorrow. --He does not appear to be grossly gaining weight  Compared to 2 days ago Will update his metabolic panel next week In regards to elevated BNP this does not appear to be grossly elevated clinically he appears stable with  no signs of overt CHF at this point will monitor #3-weight gain--as noted above weight appears to be relatively stable with weight 2 days ago although again weight was taken after lunch today which I suspect wouldl increase his weight  [id number]

## 2013-12-20 ENCOUNTER — Other Ambulatory Visit: Payer: Self-pay | Admitting: *Deleted

## 2013-12-20 MED ORDER — OXYCODONE HCL 10 MG PO TABS: ORAL_TABLET | ORAL | Status: DC

## 2013-12-20 NOTE — Telephone Encounter (Signed)
[facility] 

## 2013-12-22 ENCOUNTER — Encounter (HOSPITAL_COMMUNITY): Payer: Self-pay | Admitting: Pharmacy Technician

## 2013-12-22 ENCOUNTER — Encounter (HOSPITAL_COMMUNITY): Payer: Self-pay | Admitting: *Deleted

## 2013-12-22 NOTE — Progress Notes (Signed)
Faxed all pre op  Instructions to [facility] fax [phone], spoke with amanda normal LPN, all faxed pre op instructions received and understood.

## 2014-01-04 ENCOUNTER — Non-Acute Institutional Stay (SKILLED_NURSING_FACILITY): Payer: Medicaid Other | Admitting: Internal Medicine

## 2014-01-04 DIAGNOSIS — R221 Localized swelling, mass and lump, neck: Secondary | ICD-10-CM

## 2014-01-04 DIAGNOSIS — R220 Localized swelling, mass and lump, head: Secondary | ICD-10-CM

## 2014-01-04 DIAGNOSIS — L89309 Pressure ulcer of unspecified buttock, unspecified stage: Secondary | ICD-10-CM

## 2014-01-04 DIAGNOSIS — K118 Other diseases of salivary glands: Secondary | ICD-10-CM

## 2014-01-05 ENCOUNTER — Ambulatory Visit (HOSPITAL_COMMUNITY)
Admission: RE | Admit: 2014-01-05 | Discharge: 2014-01-05 | Disposition: A | Discharge status: Skilled Nursing Facility | Payer: Medicaid Other | Source: Ambulatory Visit | Attending: Gastroenterology | Admitting: Gastroenterology

## 2014-01-05 ENCOUNTER — Encounter (HOSPITAL_COMMUNITY): Payer: Self-pay | Admitting: Gastroenterology

## 2014-01-05 ENCOUNTER — Encounter (HOSPITAL_COMMUNITY)
Admission: RE | Disposition: A | Discharge status: Skilled Nursing Facility | Payer: Self-pay | Source: Ambulatory Visit | Attending: Gastroenterology

## 2014-01-05 ENCOUNTER — Ambulatory Visit (HOSPITAL_COMMUNITY)
Admit: 2014-01-05 | Discharge: 2014-01-05 | Disposition: A | Discharge status: Home / Self Care | Payer: Medicaid Other | Attending: Internal Medicine | Admitting: Internal Medicine

## 2014-01-05 ENCOUNTER — Encounter (HOSPITAL_COMMUNITY): Payer: Medicaid Other | Admitting: Anesthesiology

## 2014-01-05 ENCOUNTER — Ambulatory Visit (HOSPITAL_COMMUNITY): Payer: Medicaid Other | Admitting: Anesthesiology

## 2014-01-05 DIAGNOSIS — I10 Essential (primary) hypertension: Secondary | ICD-10-CM | POA: Insufficient documentation

## 2014-01-05 DIAGNOSIS — J449 Chronic obstructive pulmonary disease, unspecified: Secondary | ICD-10-CM | POA: Insufficient documentation

## 2014-01-05 DIAGNOSIS — I509 Heart failure, unspecified: Secondary | ICD-10-CM | POA: Insufficient documentation

## 2014-01-05 DIAGNOSIS — F411 Generalized anxiety disorder: Secondary | ICD-10-CM | POA: Insufficient documentation

## 2014-01-05 DIAGNOSIS — J4489 Other specified chronic obstructive pulmonary disease: Secondary | ICD-10-CM | POA: Insufficient documentation

## 2014-01-05 DIAGNOSIS — K219 Gastro-esophageal reflux disease without esophagitis: Secondary | ICD-10-CM | POA: Insufficient documentation

## 2014-01-05 DIAGNOSIS — Z79899 Other long term (current) drug therapy: Secondary | ICD-10-CM | POA: Insufficient documentation

## 2014-01-05 DIAGNOSIS — Z9861 Coronary angioplasty status: Secondary | ICD-10-CM | POA: Insufficient documentation

## 2014-01-05 DIAGNOSIS — I739 Peripheral vascular disease, unspecified: Secondary | ICD-10-CM | POA: Insufficient documentation

## 2014-01-05 DIAGNOSIS — D649 Anemia, unspecified: Secondary | ICD-10-CM | POA: Insufficient documentation

## 2014-01-05 DIAGNOSIS — K449 Diaphragmatic hernia without obstruction or gangrene: Secondary | ICD-10-CM | POA: Insufficient documentation

## 2014-01-05 DIAGNOSIS — Z87891 Personal history of nicotine dependence: Secondary | ICD-10-CM | POA: Insufficient documentation

## 2014-01-05 DIAGNOSIS — I2510 Atherosclerotic heart disease of native coronary artery without angina pectoris: Secondary | ICD-10-CM | POA: Insufficient documentation

## 2014-01-05 HISTORY — PX: ESOPHAGOGASTRODUODENOSCOPY (EGD) WITH PROPOFOL: SHX5813

## 2014-01-05 LAB — BASIC METABOLIC PANEL
BUN: 39 mg/dL — ABNORMAL HIGH (ref 6–23)
CHLORIDE: 97 meq/L (ref 96–112)
CO2: 29 mEq/L (ref 19–32)
Calcium: 10.2 mg/dL (ref 8.4–10.5)
Creatinine, Ser: 1.21 mg/dL (ref 0.50–1.35)
GFR calc non Af Amer: 65 mL/min — ABNORMAL LOW (ref >90)
GFR, EST AFRICAN AMERICAN: 75 mL/min — AB (ref >90)
GLUCOSE: 99 mg/dL (ref 70–99)
POTASSIUM: 4.7 meq/L (ref 3.7–5.3)
Sodium: 139 mEq/L (ref 137–147)

## 2014-01-05 LAB — HEMOGLOBIN AND HEMATOCRIT, BLOOD
HEMATOCRIT: 27.4 % — AB (ref 39.0–52.0)
Hemoglobin: 8.8 g/dL — ABNORMAL LOW (ref 13.0–17.0)

## 2014-01-05 SURGERY — ESOPHAGOGASTRODUODENOSCOPY (EGD) WITH PROPOFOL
Anesthesia: Monitor Anesthesia Care

## 2014-01-05 MED ORDER — SODIUM CHLORIDE 0.9 % IV SOLN: INTRAVENOUS | Status: DC

## 2014-01-05 MED ORDER — PROPOFOL 10 MG/ML IV BOLUS
INTRAVENOUS | Status: DC | PRN
  Administered 2014-01-05: 20 mg via INTRAVENOUS
  Administered 2014-01-05: 40 mg via INTRAVENOUS

## 2014-01-05 MED ORDER — FENTANYL CITRATE 0.05 MG/ML IJ SOLN
INTRAMUSCULAR | Status: AC
  Filled 2014-01-05: qty 2

## 2014-01-05 MED ORDER — MIDAZOLAM HCL 5 MG/5ML IJ SOLN
INTRAMUSCULAR | Status: DC | PRN
  Administered 2014-01-05 (×2): 1 mg via INTRAVENOUS

## 2014-01-05 MED ORDER — FENTANYL CITRATE 0.05 MG/ML IJ SOLN
INTRAMUSCULAR | Status: DC | PRN
  Administered 2014-01-05 (×2): 50 ug via INTRAVENOUS

## 2014-01-05 MED ORDER — LIDOCAINE HCL (CARDIAC) 20 MG/ML IV SOLN
INTRAVENOUS | Status: AC
  Filled 2014-01-05: qty 5

## 2014-01-05 MED ORDER — PROPOFOL 10 MG/ML IV BOLUS
INTRAVENOUS | Status: AC
  Filled 2014-01-05: qty 20

## 2014-01-05 MED ORDER — LACTATED RINGERS IV SOLN
INTRAVENOUS | Status: DC
  Administered 2014-01-05 (×2): via INTRAVENOUS

## 2014-01-05 MED ORDER — MIDAZOLAM HCL 2 MG/2ML IJ SOLN
INTRAMUSCULAR | Status: AC
  Filled 2014-01-05: qty 2

## 2014-01-05 SURGICAL SUPPLY — 14 items

## 2014-01-05 NOTE — Anesthesia Preprocedure Evaluation (Signed)
Anesthesia Evaluation  Patient identified by MRN, date of birth, ID band Patient awake    Reviewed: Allergy & Precautions, H&P , NPO status , Patient's Chart, lab work & pertinent test results  Airway Mallampati: II TM Distance: >3 FB Neck ROM: Full    Dental  (+) Poor Dentition, Missing, Dental Advisory Given   Pulmonary asthma , COPDCurrent Smoker, former smoker,  breath sounds clear to auscultation  Pulmonary exam normal       Cardiovascular hypertension, + CAD, + Cardiac Stents, + Peripheral Vascular Disease and +CHF Rhythm:Regular Rate:Normal     Neuro/Psych Anxiety    GI/Hepatic   Endo/Other    Renal/GU Renal disease     Musculoskeletal   Abdominal   Peds  Hematology  (+) anemia ,   Anesthesia Other Findings   Reproductive/Obstetrics                           Anesthesia Physical Anesthesia Plan  ASA: III  Anesthesia Plan: MAC   Post-op Pain Management:    Induction: Intravenous  Airway Management Planned: Nasal Cannula  Additional Equipment:   Intra-op Plan:   Post-operative Plan:   Informed Consent: I have reviewed the patients History and Physical, chart, labs and discussed the procedure including the risks, benefits and alternatives for the proposed anesthesia with the patient or authorized representative who has indicated his/her understanding and acceptance.   Dental advisory given  Plan Discussed with: CRNA  Anesthesia Plan Comments:         Anesthesia Quick Evaluation

## 2014-01-05 NOTE — Transfer of Care (Signed)
Immediate Anesthesia Transfer of Care Note  Patient: Edwin [name]  Procedure(s) Performed: Procedure(s): ESOPHAGOGASTRODUODENOSCOPY (EGD) WITH PROPOFOL (N/A)  Patient Location: PACU and Endoscopy Unit  Anesthesia Type:MAC  Level of Consciousness: awake, oriented, patient cooperative and responds to stimulation  Airway & Oxygen Therapy: Patient Spontanous Breathing and Patient connected to nasal cannula oxygen  Post-op Assessment: Report given to PACU RN, Post -op Vital signs reviewed and stable and Patient moving all extremities  Post vital signs: Reviewed and stable  Complications: No apparent anesthesia complications

## 2014-01-05 NOTE — H&P (Signed)
Edwin [name] is an 58 y.o. male.   Chief Complaint: Heme positive stool HPI: This patient was critically ill about 4 months ago, when he was in the hospital with a gangrenous leg that subsequently had to be amputated. During that time, he underwent an unsedated endoscopy because of hematochezia. At that time, he had significant erosive gastritis associated with a very large gastric residual. He had been on naproxen prior to that admission. He has overall made a very good recovery, but he remains Hemoccult positive and has had somewhat refractory anemia, although he is free of significant upper tract symptoms at this time  Past Medical History  Diagnosis Date  . COPD 11/27/2007    Qualifier: Diagnosis of  By: Garen Grams    . ERECTILE DYSFUNCTION 11/27/2007    Qualifier: Diagnosis of  By: Garen Grams    . HYPERLIPIDEMIA 11/27/2007    Qualifier: Diagnosis of  By: Garen Grams    . HYPERTENSION, BENIGN 05/23/2009    Qualifier: Diagnosis of  By: Melvyn Novas MD, Christena Deem   . Coronary artery disease     Cardiac catheterization in 2008 showed 99% stenosis in proximal left circumflex which was treated with Taxus drug-eluting stent. There was mild RCA and LAD disease with normal ejection fraction.  Marland Kitchen PAD (peripheral artery disease)     Previous right SFA stent in 2003. Directional atherectomy right SFA in 01/2013  . Tobacco use   . CHF (congestive heart failure)   . Hypertension   . Asthma   . Traumatic amputation of leg(s) (complete) (partial), unilateral, at or above knee, without mention of complication   . Pressure ulcer, lower back(707.03)   . Other severe protein-calorie malnutrition   . Anemia, unspecified   . Atrial fibrillation   . Gastric ulcer, unspecified as acute or chronic, without mention of hemorrhage, perforation, or obstruction     Past Surgical History  Procedure Laterality Date  . Nasal sinus surgery  2004  . Acne cyst removal    . Other surgical history       blocked artery  . Endarterectomy Left 08/16/2013    Procedure: Exploration of Left Neck/Fix Bleeding;  Surgeon: Elam Dutch, MD;  Location[facility] OR;  Service: Vascular;  Laterality: Left;  . Esophagogastroduodenoscopy N/A 09/08/2013    Procedure: ESOPHAGOGASTRODUODENOSCOPY (EGD);  Surgeon: Cleotis Nipper, MD;  Location: [facility] ENDOSCOPY;  Service: Endoscopy;  Laterality: N/A;  . Flexible sigmoidoscopy N/A 09/08/2013    Procedure: FLEXIBLE SIGMOIDOSCOPY;  Surgeon: Cleotis Nipper, MD;  Location: [facility] ENDOSCOPY;  Service: Endoscopy;  Laterality: N/A;  unprepp  . Amputation Right 09/09/2013    Procedure: AMPUTATION ABOVE KNEE;  Surgeon: Rosetta Posner, MD;  Location: [facility] OR;  Service: Vascular;  Laterality: Right;    Family History  Problem Relation Age of Onset  . Adopted: Yes  . Heart disease Maternal Grandfather   . Heart disease Paternal Grandfather    Social History:  reports that he quit smoking about 3 months ago. He does not have any smokeless tobacco history on file. He reports that he uses illicit drugs. He reports that he does not drink alcohol.  Allergies:  Allergies  Allergen Reactions  . Codeine Nausea Only    Medications Prior to Admission  Medication Sig Dispense Refill  . Amino Acids-Protein Hydrolys (FEEDING SUPPLEMENT, PRO-STAT SUGAR FREE 64,) LIQD Take 30 mLs by mouth 3 (three) times daily with meals.      . colchicine 0.6 MG tablet Take 0.6 mg  by mouth daily.       Marland Kitchen ENSURE (ENSURE) Take 237 mLs by mouth at bedtime.      . ferrous fumarate-iron polysaccharide complex (TANDEM) 162-115.2 MG CAPS Take 1 capsule by mouth daily with breakfast.  30 capsule    . folic acid (FOLVITE) 1 MG tablet Take 1 tablet (1 mg total) by mouth daily.      . furosemide (LASIX) 20 MG tablet Take 20 mg by mouth daily.       Marland Kitchen lisinopril (PRINIVIL,ZESTRIL) 2.5 MG tablet Take 2.5 mg by mouth every morning.      . Nutritional Supplements (ENSURE CLEAR) LIQD Take 237 mLs by mouth 2 (two) times  daily.      . Oxycodone HCl 10 MG TABS Take one tablet by mouth every 4 hours as needed for pain  180 tablet  0  . pantoprazole (PROTONIX) 40 MG tablet Take 1 tablet (40 mg total) by mouth 2 (two) times daily.      . sucralfate (CARAFATE) 1 GM/10ML suspension Take 10 mLs (1 g total) by mouth 4 (four) times daily -  with meals and at bedtime.  420 mL  0  . thiamine 100 MG tablet Take 1 tablet (100 mg total) by mouth daily.      Marland Kitchen zolpidem (AMBIEN) 10 MG tablet Take one tablet by mouth at bedtime as needed for rest  30 tablet  5  . acetaminophen (TYLENOL) 325 MG tablet Take 2 tablets (650 mg total) by mouth every 6 (six) hours as needed for mild pain, fever or headache.      . albuterol (PROVENTIL HFA;VENTOLIN HFA) 108 (90 BASE) MCG/ACT inhaler Inhale 2 puffs into the lungs every 6 (six) hours as needed for wheezing or shortness of breath.  1 Inhaler  2  . albuterol (PROVENTIL) (2.5 MG/3ML) 0.083% nebulizer solution Take 3 mLs (2.5 mg total) by nebulization every 2 (two) hours as needed for wheezing or shortness of breath.  75 mL  12  . aluminum & magnesium hydroxide-simethicone (MYLANTA) 500-450-40 MG/5ML suspension Take 30 mLs by mouth every 4 (four) hours as needed for indigestion.      . mirtazapine (REMERON SOL-TAB) 15 MG disintegrating tablet Take 15 mg by mouth at bedtime.        Results for orders placed during the hospital encounter of 01/05/14 (from the past 48 hour(s))  BASIC METABOLIC PANEL     Status: Abnormal   Collection Time    01/05/14  1:25 PM      Result Value Ref Range   Sodium 139  137 - 147 mEq/L   Potassium 4.7  3.7 - 5.3 mEq/L   Chloride 97  96 - 112 mEq/L   CO2 29  19 - 32 mEq/L   Glucose, Bld 99  70 - 99 mg/dL   BUN 39 (*) 6 - 23 mg/dL   Creatinine, Ser 1.21  0.50 - 1.35 mg/dL   Calcium 10.2  8.4 - 10.5 mg/dL   GFR calc non Af Amer 65 (*) >90 mL/min   GFR calc Af Amer 75 (*) >90 mL/min   Comment: (NOTE)     The eGFR has been calculated using the CKD EPI equation.      This calculation has not been validated in all clinical situations.     eGFR's persistently <90 mL/min signify possible Chronic Kidney     Disease.  HEMOGLOBIN AND HEMATOCRIT, BLOOD     Status: Abnormal   Collection Time  01/05/14  1:25 PM      Result Value Ref Range   Hemoglobin 8.8 (*) 13.0 - 17.0 g/dL   HCT 27.4 (*) 39.0 - 52.0 %   No results found.  ROS please see history of present illness  Blood pressure 127/75, pulse 81, temperature 98.3 F (36.8 C), temperature source Oral, resp. rate 17, height 5' 8.5" (1.74 m), weight 62.596 kg (138 lb), SpO2 100.00%. Physical Exam  The patient appears quite healthy overall. He is in no respiratory distress, and without frank pallor despite his anemia. The chest is clear and vesicular breath sounds are reasonably good, no wheezing. Heart normal. Abdomen without overt mass or tenderness  Assessment/Plan Anemia with history of erosive gastritis, currently on Protonix and no longer on nonsteroidal anti-inflammatory drugs.  Because of his persistent heme positivity, we will proceed to endoscopic evaluation at this time. Eventually, he should have a colonoscopy in view of his age and heme positivity, but he is still convalescing from his critical illness and does not feel strong enough, even having gained about 30 pounds, to undergo a colonoscopy at this  Superior V 01/05/2014, 2:55 PM

## 2014-01-05 NOTE — Discharge Instructions (Addendum)
Keep July 13th appointment  Gastrointestinal Endoscopy Care After Refer to this sheet in the next few weeks. These instructions provide you with information on caring for yourself after your procedure. Your caregiver may also give you more specific instructions. Your treatment has been planned according to current medical practices, but problems sometimes occur. Call your caregiver if you have any problems or questions after your procedure. HOME CARE INSTRUCTIONS  If you were given medicine to help you relax (sedative), do not drive, operate machinery, or sign important documents for 24 hours.  Avoid alcohol and hot or warm beverages for the first 24 hours after the procedure.  Only take over-the-counter or prescription medicines for pain, discomfort, or fever as directed by your caregiver. You may resume taking your normal medicines unless your caregiver tells you otherwise. Ask your caregiver when you may resume taking medicines that may cause bleeding, such as aspirin, clopidogrel, or warfarin.  You may return to your normal diet and activities on the day after your procedure, or as directed by your caregiver. Walking may help to reduce any bloated feeling in your abdomen.  Drink enough fluids to keep your urine clear or pale yellow.  You may gargle with salt water if you have a sore throat. SEEK IMMEDIATE MEDICAL CARE IF:  You have severe nausea or vomiting.  You have severe abdominal pain, abdominal cramps that last longer than 6 hours, or abdominal swelling (distention).  You have severe shoulder or back pain.  You have trouble swallowing.  You have shortness of breath, your breathing is shallow, or you are breathing faster than normal.  You have a fever or a rapid heartbeat.  You vomit blood or material that looks like coffee grounds.  You have bloody, black, or tarry stools. MAKE SURE YOU:  Understand these instructions.  Will watch your condition.  Will get help  right away if you are not doing well or get worse. Document Released: 02/26/2004 Document Revised: 01/13/2012 Document Reviewed: 10/14/2011 [facility] Patient Information 2014 [place], [place].

## 2014-01-05 NOTE — Anesthesia Postprocedure Evaluation (Signed)
  Anesthesia Post-op Note  Patient: Edwin [name]  Procedure(s) Performed: Procedure(s) (LRB): ESOPHAGOGASTRODUODENOSCOPY (EGD) WITH PROPOFOL (N/A)  Patient Location: PACU  Anesthesia Type: MAC  Level of Consciousness: awake and alert   Airway and Oxygen Therapy: Patient Spontanous Breathing  Post-op Pain: mild  Post-op Assessment: Post-op Vital signs reviewed, Patient's Cardiovascular Status Stable, Respiratory Function Stable, Patent Airway and No signs of Nausea or vomiting  Last Vitals:  Filed Vitals:   01/05/14 1545  BP: 102/71  Pulse: 88  Temp:   Resp: 16    Post-op Vital Signs: stable   Complications: No apparent anesthesia complications

## 2014-01-05 NOTE — Op Note (Signed)
[facility] [place], [address]   ENDOSCOPY PROCEDURE REPORT  PATIENT: [name], [name]  MR#: [id number] BIRTHDATE: 31-May-1956 , 58  yrs. old GENDER: Male ENDOSCOPIST:Lissy Deuser, MD REFERRED BY:  --- PROCEDURE DATE:  01/05/2014 PROCEDURE:     upper endoscopy ASA CLASS: INDICATIONS:   persistently heme positive stool and somewhat refractory anemia in a 58 year old gentleman who had severe erosive stress gastritis while clinically septic with a gangrenous leg in the ICU 4 months ago  MEDICATION:    MAC per anesthesia TOPICAL ANESTHETIC:  DESCRIPTION OF PROCEDURE:   the patient came as an outpatient to the [facility] long endoscopy unit, provided written consent, and received MAC sedation, after which time out was performed. Of note, the patient was somewhat difficult to sedate, even with propofol.  The Pentax video endoscope was passed under direct vision. The larynx looked normal and the esophagus was readily entered.  There was a 2 cm hiatal hernia and a minimal esophageal ring, but the esophagus was otherwise normal, specifically without evidence of reflux esophagitis, Barrett's esophagus, varices, infection, or neoplasia.  The stomach contained a small bilious residual which was suctioned out, but the gastric mucosa was free of inflammation. Specifically, no erythema, erosions, ulcers, or mucosal hemorrhages were seen. No polyps or masses were present. A retroflexed view of the cardia was unremarkable.  The pylorus, duodenal bulb, and second duodenum were unremarkable.  The scope was then removed from the patient. No biopsies were obtained. He tolerated the procedure well.     COMPLICATIONS: None  ENDOSCOPIC IMPRESSION:  1. No source of heme positivity or anemia evident on this exam 2. Resolution of previous gastritis. Currently normal examination. 3. Mild bile reflux, not felt to be clinically significant given the absence of  gastric erythema 4. Small hiatal hernia  RECOMMENDATIONS:  1. Continue current medications 2. Colonoscopy or barium enema when the patient feels up to it. He is still somewhat convalescing from his leg amputation, and the severe weight loss and deconditioning which occurred while he was in the hospital.    _______________________________ eSigned:  Ronald Lobo, MD 01/05/2014 3:49 PM    PATIENT NAME:  Edwin [name], Edwin [name] MR#: [id number]

## 2014-01-06 ENCOUNTER — Ambulatory Visit (HOSPITAL_COMMUNITY): Admit: 2014-01-06 | Payer: Medicaid Other

## 2014-01-06 ENCOUNTER — Encounter (HOSPITAL_COMMUNITY): Payer: Self-pay | Admitting: Gastroenterology

## 2014-01-06 ENCOUNTER — Inpatient Hospital Stay
Admission: RE | Admit: 2014-01-06 | Discharge: 2014-03-29 | Disposition: A | Discharge status: Nursing Home (Includes ICF) | Payer: Medicaid Other | Source: Ambulatory Visit | Attending: Internal Medicine | Admitting: Internal Medicine

## 2014-01-06 ENCOUNTER — Other Ambulatory Visit (HOSPITAL_BASED_OUTPATIENT_CLINIC_OR_DEPARTMENT_OTHER): Payer: Self-pay | Admitting: Internal Medicine

## 2014-01-06 ENCOUNTER — Ambulatory Visit (HOSPITAL_COMMUNITY)
Admission: RE | Admit: 2014-01-06 | Discharge: 2014-01-06 | Disposition: A | Discharge status: Home / Self Care | Payer: Medicaid Other | Source: Ambulatory Visit | Attending: Internal Medicine | Admitting: Internal Medicine

## 2014-01-06 DIAGNOSIS — K118 Other diseases of salivary glands: Secondary | ICD-10-CM

## 2014-01-06 DIAGNOSIS — K119 Disease of salivary gland, unspecified: Secondary | ICD-10-CM | POA: Diagnosis present

## 2014-01-06 DIAGNOSIS — I6529 Occlusion and stenosis of unspecified carotid artery: Secondary | ICD-10-CM | POA: Insufficient documentation

## 2014-01-06 DIAGNOSIS — I700 Atherosclerosis of aorta: Secondary | ICD-10-CM | POA: Diagnosis not present

## 2014-01-06 MED ORDER — IOHEXOL 300 MG/ML  SOLN
75.0000 mL | Freq: Once | INTRAMUSCULAR | Status: AC | PRN
  Administered 2014-01-06: 75 mL via INTRAVENOUS

## 2014-01-09 ENCOUNTER — Non-Acute Institutional Stay (SKILLED_NURSING_FACILITY): Payer: Medicaid Other | Admitting: Internal Medicine

## 2014-01-09 DIAGNOSIS — R220 Localized swelling, mass and lump, head: Secondary | ICD-10-CM

## 2014-01-09 DIAGNOSIS — K118 Other diseases of salivary glands: Secondary | ICD-10-CM

## 2014-01-09 DIAGNOSIS — R221 Localized swelling, mass and lump, neck: Secondary | ICD-10-CM

## 2014-01-09 NOTE — Progress Notes (Addendum)
Patient ID: Edwin [name], male   DOB: Nov 01, 1955, 58 y.o.   MRN: [id number]                 PROGRESS NOTE  DATE:  01/04/2014    FACILITY: [facility]    LEVEL OF CARE:   SNF   Acute Visit   CHIEF COMPLAINT:  Review of coccyx wound, also right neck mass.    HISTORY OF PRESENT ILLNESS:  This is a patient who came to [place] in late February of this year.  He had been admitted with gangrene of his lower extremity and ended up having an amputation.    He has a complex social situation, I think was essentially homeless and did not have the ability to look after himself.  He has been here for a protracted period of time.    We have been dealing with a recurrent wound over his coccyx, which at one point was a lot bigger.  The wound care staff in the facility are concerned that this may represent a cyst as it tends to wax and wane and then look like it is going to close and then open up again with some drainage.  I have been asked to look at that today.    In the course of my interview with the patient, he showed me a swelling over the angle of his jaw, the lower aspect of the mandible.  This is more superficial than I would expect his parotid gland to be.  He tells me that he has been dealing with this for roughly a year now.  It initially started as a small area that was very stagnant.  However, he feels that it is fairly rapidly enlarging.    PHYSICAL EXAMINATION:   HEENT:  Indeed, over the lower angle of the mandible is a very firm nodule.  This is not particularly tender or inflamed.  However, I cannot really get my hand around this.  I do not feel any lymphadenopathy.  His oral exam does not show any worrisome lesions.   SKIN:  INSPECTION:  Coccyx:  The area is about the size of a dime, has hypergranulation tissue present.  The granulation tissue was removed with a #15 scalpel.    ASSESSMENT/PLAN:  Pressure area over the coccyx.   Whether there is a cyst here or not, I am  really uncertain.  However, I removed a fairly marked area of hypergranulation tissue with a scalpel.  I am not going to change the dressing at this point, but I would like to follow up on this next week.    Swelling over the angle of his jaw.  The etiology here is unclear.  I am not totally convinced that this is connected to his parotid gland.  However, tumors in this area would raise this as my first thought.  I have ordered a CT scan of his head and neck.

## 2014-01-10 DIAGNOSIS — K118 Other diseases of salivary glands: Secondary | ICD-10-CM | POA: Insufficient documentation

## 2014-01-12 NOTE — Progress Notes (Addendum)
Patient ID: Edwin [name], male   DOB: [birth year], 58 y.o.   MRN: [id number]                  PROGRESS NOTE  DATE:  01/09/2014    FACILITY: [facility]    LEVEL OF CARE:   SNF   Acute Visit   CHIEF COMPLAINT:  Follow up swelling in the angle of his right jaw.    HISTORY OF PRESENT ILLNESS:  I saw Edwin [name] for this problem last week.  He had an expanding rubbery nodule at the angle of his jaw just below the mandible.  This was nontender, but fairly extensive and I could not really get my hands around this in any way.  He does not have any adenopathy.  I sent him for a CT scan of the head and neck.  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  The feeling of the radiologist was that this was benign, but an ENT consult was suggested with regards to the possibility of an underlying malignancy/fine needle aspirate.  I will make this happen.    Also, he has been for a follow-up endoscopy.  This  apparently was normal, although I do not have these results right in front of me.  During his index hospitalization in February, he had nonsteroidal gastropathy.  He underwent an upper endoscopy.  He was discharged on b.i.d. Protonix for four weeks, then decreased to daily.  This actually may be able to be stopped based on what the actual endoscopy shows.  He has generally otherwise been doing quite well.    PHYSICAL EXAMINATION:   HEENT:  The nodule is still present.  This is firm, rubbery, but not free-floating.  I cannot obviously feel around this.  It is, again, nontender.  There is no associated adenopathy by exam or by CT scan.      ASSESSMENT/PLAN:  Parotid tumor.  We will send him to ENT per the recommendations with regards to this area.    Recent endoscopy, which  apparently was well healed.  I will look at this and make a determination about the duration of proton pump inhibitor therapy.

## 2014-01-19 ENCOUNTER — Non-Acute Institutional Stay (SKILLED_NURSING_FACILITY): Payer: Medicaid Other | Admitting: Internal Medicine

## 2014-01-19 ENCOUNTER — Encounter: Payer: Self-pay | Admitting: Internal Medicine

## 2014-01-19 DIAGNOSIS — I5030 Unspecified diastolic (congestive) heart failure: Secondary | ICD-10-CM

## 2014-01-19 DIAGNOSIS — I509 Heart failure, unspecified: Secondary | ICD-10-CM

## 2014-01-19 DIAGNOSIS — L0390 Cellulitis, unspecified: Principal | ICD-10-CM

## 2014-01-19 DIAGNOSIS — I10 Essential (primary) hypertension: Secondary | ICD-10-CM

## 2014-01-19 DIAGNOSIS — K922 Gastrointestinal hemorrhage, unspecified: Secondary | ICD-10-CM

## 2014-01-19 DIAGNOSIS — I739 Peripheral vascular disease, unspecified: Secondary | ICD-10-CM

## 2014-01-19 DIAGNOSIS — R220 Localized swelling, mass and lump, head: Secondary | ICD-10-CM

## 2014-01-19 DIAGNOSIS — J449 Chronic obstructive pulmonary disease, unspecified: Secondary | ICD-10-CM

## 2014-01-19 DIAGNOSIS — R221 Localized swelling, mass and lump, neck: Secondary | ICD-10-CM

## 2014-01-19 DIAGNOSIS — L0291 Cutaneous abscess, unspecified: Secondary | ICD-10-CM

## 2014-01-19 DIAGNOSIS — K118 Other diseases of salivary glands: Secondary | ICD-10-CM

## 2014-01-19 NOTE — Progress Notes (Signed)
Patient ID: Edwin [name], male   DOB: August 24, 1955, 58 y.o.   MRN: [id number]   This is a routine-acute visit.  Level of care skilled.  Facility [facility].  Chief complaint medical management of chronic issues including peripheral vascular disease status post right  below the knee amputation history of CHF diastolic-COPD-gout--GI bleed  Acute visit secondary to lesion back of neck also followup carotid swelling.  History of present illness.  Patient is a very pleasant 58 year old male with the above diagnoses he has been actually quite stable  Recently-he came here after sustaining a right below the knee amputation secondary to infected right foot-this appears to have healed fairly unremarkable -- stay here was complicated with some history of GI bleeding this was thought to be possibly nonsteroid induced-and his hemoglobin has stabilized he did have an endoscopy done which apparently did not show any acute pathology --he continues on Protonix.  Also has had some history of edema left lower extremity-he does have a history of diastolic CHF but this has been stable his edema appears improved he is on low-dose Lasix.  Most acute issue recently was a swelling noted on his right parotid area Dr. Dellia Nims did assess this and ordered a CT scan which shows most likely benign etiology but cannot rule out malignancy an ENT consult is pending for possible biopsy of this.  Patient also has developed some swelling on the back of his left neck-apparently this has gotten progressively bigger and somewhat more painful over the past week-10 days-today it is somewhat red and tender.  Vital signs continued to be stable he does have a history COPD but this has been a non-acute issue here for some time continues to ambulate about the facility in a wheelchair and appears to be adapting extremely well to this facility.  Family medical social history as been reviewed including admission note on 09/13/2013.  Medications  have been reviewed per MAR.  Review of systems  In general no complaints of fever or chills.  Skin does not complaining a rash or itching again appears to have a cystlike area on the back of his neck with some pain with movement.  Head ears eyes nose mouth and throat-does not complain of visual changes.  Respiratory no complaints shortness breath or cough.  Cardiac no chest pain -- some baseline left foot edema   GI does not complaining of any abdominal pain nausea or vomiting.  Muscle skeletal does not complaining of joint pain this has apparently been well controlled here for an extended period of time.  Neurologic does not complaining of dizziness headache or syncopal type episodes.  Psych no complaint of depression no anxiety apparently depression had been an issue in the past.  Physical exam temperature 98.6 pulse 80 respirations 20 blood pressure 117/77.  In general this is a pleasant middle-age male in no distress sitting comfortably in his wheelchair.  His skin is warm and dry -- I do note on the back of his neck right side there is approximately 2 cm raised firm area that is erythematous slightly warm to touch it is tender there is no surrounding erythema or drainage.  I note the right parotid area there is a raised firm area apparently this has grown in size over the last several weeks  Chest is clear to auscultation there is no labored breathing.  Heart is regular rate and rhythm without murmur gallop or rub he has some mild edema of his left foot but this appears to  be fairly minimal.  Abdomen is soft nontender with positive bowel sounds.  Muscle skeletal does have right below the knee amputation EMLA to wheelchair moves his extremities at baseline I do not note any deformities.  Neurologic is grossly intact his speech is clear.  Psych he is alert and oriented pleasant and appropriate.  Labs.  01/09/2014.  The WBC 5.0 hemoglobin 9.3 platelets 243.  Sodium  139 potassium 4.4 BUN 40 creatinine 1.3.  11/30/2013.  BNP 903.8  Uric acid-6.6  March l 2015.  Liver function tests showed an albumin of 1.6-  Assessment and plan.  #1-neck lesion--he does have an ENT consult pending I believe he will need an antibiotic we'll start doxycycline 100 mg twice a day for 7 days and monitor for changes.--Also update CBC with differential--warm compresses bid to area--  #2-parotid swelling-again CT scan indicate possibly benign etiology but recommendation for a biopsy and this is pending per ENT visit.  #3-history of GI bleed-he is on Protonix hemoglobin has shown improvement at this point will continue to monitor endoscopy did not appear to show a serious etiology.  #4-history peripheral vascular disease again there has been hesitancy for anticoagulation secondary to GI bleed--l this appears stable he does not complaining of pain he does receive oxycodone as needed for pain.  #5-hypertension this appears stable he is on low-dose lisinopril.  #1-IWPYKDX of diastolic CHF this is been stable he is on Lasix low dose-he has gained some weight this has been quite steady---gained  about 10 pounds in the past month-however he is eating extremely well I do not see any increased edema from baseline or chest congestion or shortness of breath he appears to be doing  well in this regard  #7-history of gout? Pseudogout-he has responded to colchicine he is on this empirically in this at this point appears to be effective  #8-question mild renal insufficiency-we'll update a metabolic panel next lab day.  #9-COPD-this is stable he is on when necessary nebulizers but apparently does not really need to use much at all.  [id number]     .

## 2014-01-23 ENCOUNTER — Non-Acute Institutional Stay (SKILLED_NURSING_FACILITY): Payer: Medicaid Other | Admitting: Internal Medicine

## 2014-01-23 DIAGNOSIS — L723 Sebaceous cyst: Secondary | ICD-10-CM

## 2014-01-24 ENCOUNTER — Other Ambulatory Visit: Payer: Self-pay | Admitting: *Deleted

## 2014-01-24 MED ORDER — OXYCODONE HCL 10 MG PO TABS: ORAL_TABLET | ORAL | Status: DC

## 2014-01-24 NOTE — Telephone Encounter (Signed)
[facility] 

## 2014-01-25 ENCOUNTER — Non-Acute Institutional Stay (SKILLED_NURSING_FACILITY): Payer: Medicaid Other | Admitting: Internal Medicine

## 2014-01-25 DIAGNOSIS — L723 Sebaceous cyst: Secondary | ICD-10-CM

## 2014-01-30 NOTE — Progress Notes (Addendum)
Patient ID: Edwin [name], male   DOB: 08/19/1955, 58 y.o.   MRN: [id number]                  PROGRESS NOTE  DATE:  01/23/2014    FACILITY: [facility]     LEVEL OF CARE:   SNF   Acute Visit   CHIEF COMPLAINT:  Mass on the left side of his neck.    HISTORY OF PRESENT ILLNESS:  I was asked to see Mr. Edwin [name] today, who has developed a large swelling/cellulitis on the left side of his neck posteriorly.  He had had a previous right-sided parotid tumor, recently diagnosed.  I was somewhat concerned about a connection.  I came to see this urgently.    PHYSICAL EXAMINATION:   SKIN:  INSPECTION:  On the posterior neck is a large, painful nodule that easily expressed copious amounts of foul-smelling material.  The patient tells me that he has had this previously and had to have them surgically excised.  A large amount of material was expressed without mechanical debridement.    ASSESSMENT/PLAN:  I suspect this is a sebaceous cyst.  I was able to express a large amount of material from this.  I did a culture to exclude an abscess, although I think this is more likely to be a sebaceous cyst.  I am going to put him on empiric doxycycline.  I will have another look at this on Wednesday.  It may need to be opened and I would be prepared to do this in the facility.

## 2014-02-01 NOTE — Progress Notes (Addendum)
Patient ID: Edwin [name], male   DOB: 04/16/1956, 58 y.o.   MRN: [id number]                  PROGRESS NOTE  DATE:  01/25/2014    FACILITY: [facility]    LEVEL OF CARE:   SNF   Acute Visit   CHIEF COMPLAINT:  Procedural note for sebaceous cyst on the left posterior neck.    HISTORY OF PRESENT ILLNESS:  I had seen Edwin [name] two days ago.  He had a very painful, raised, erythematous area on the posterior left neck.  He had already been started on doxycycline.  A culture of this area is negative, establishing this as a sebaceous cyst.  Indeed, the patient gave a history of having prior similar lesions.    PHYSICAL EXAMINATION:   SKIN:  INSPECTION:  The area is smaller than when I saw this 48 hours ago; nevertheless, was still quite a significant size.    PROCEDURE:  The area was cleaned with topical alcohol, anesthetized with 1% lidocaine, and opened with a #15 scalpel.  Copious amounts of sebaceous material was obtained.  The patient tolerated this well.    ASSESSMENT/PLAN:  Sebaceous cyst.  This lesion was excised.  There is no reason for antibiotics as the culture of this area was negative.

## 2014-02-09 ENCOUNTER — Ambulatory Visit (INDEPENDENT_AMBULATORY_CARE_PROVIDER_SITE_OTHER): Payer: Medicaid Other | Admitting: Otolaryngology

## 2014-02-09 ENCOUNTER — Other Ambulatory Visit (HOSPITAL_COMMUNITY)
Admission: RE | Admit: 2014-02-09 | Discharge: 2014-02-09 | Disposition: A | Discharge status: Home / Self Care | Payer: Medicaid Other | Source: Ambulatory Visit | Attending: Otolaryngology | Admitting: Otolaryngology

## 2014-02-09 DIAGNOSIS — D119 Benign neoplasm of major salivary gland, unspecified: Secondary | ICD-10-CM | POA: Insufficient documentation

## 2014-02-09 DIAGNOSIS — K119 Disease of salivary gland, unspecified: Secondary | ICD-10-CM | POA: Diagnosis present

## 2014-02-09 DIAGNOSIS — D37039 Neoplasm of uncertain behavior of the major salivary glands, unspecified: Secondary | ICD-10-CM

## 2014-02-15 ENCOUNTER — Encounter: Payer: Self-pay | Admitting: Family

## 2014-02-16 ENCOUNTER — Encounter: Payer: Self-pay | Admitting: Family

## 2014-02-16 ENCOUNTER — Ambulatory Visit (INDEPENDENT_AMBULATORY_CARE_PROVIDER_SITE_OTHER): Payer: Medicaid Other | Admitting: Family

## 2014-02-16 ENCOUNTER — Ambulatory Visit (HOSPITAL_COMMUNITY)
Admission: RE | Admit: 2014-02-16 | Discharge: 2014-02-16 | Disposition: A | Discharge status: Home / Self Care | Payer: Medicaid Other | Source: Ambulatory Visit | Attending: Family | Admitting: Family

## 2014-02-16 VITALS — BP 116/72 | HR 85 | Resp 16 | Ht 68.0 in | Wt 155.0 lb

## 2014-02-16 DIAGNOSIS — I739 Peripheral vascular disease, unspecified: Secondary | ICD-10-CM

## 2014-02-16 DIAGNOSIS — I6529 Occlusion and stenosis of unspecified carotid artery: Secondary | ICD-10-CM

## 2014-02-16 DIAGNOSIS — Z48812 Encounter for surgical aftercare following surgery on the circulatory system: Secondary | ICD-10-CM | POA: Diagnosis present

## 2014-02-16 DIAGNOSIS — S15009A Unspecified injury of unspecified carotid artery, initial encounter: Secondary | ICD-10-CM

## 2014-02-16 DIAGNOSIS — L97909 Non-pressure chronic ulcer of unspecified part of unspecified lower leg with unspecified severity: Secondary | ICD-10-CM

## 2014-02-16 DIAGNOSIS — I779 Disorder of arteries and arterioles, unspecified: Secondary | ICD-10-CM | POA: Insufficient documentation

## 2014-02-16 DIAGNOSIS — R209 Unspecified disturbances of skin sensation: Secondary | ICD-10-CM

## 2014-02-16 DIAGNOSIS — I6523 Occlusion and stenosis of bilateral carotid arteries: Secondary | ICD-10-CM

## 2014-02-16 DIAGNOSIS — L97929 Non-pressure chronic ulcer of unspecified part of left lower leg with unspecified severity: Secondary | ICD-10-CM

## 2014-02-16 DIAGNOSIS — R200 Anesthesia of skin: Secondary | ICD-10-CM | POA: Insufficient documentation

## 2014-02-16 NOTE — Progress Notes (Signed)
Established Carotid Patient   History of Present Illness  Edwin [name] is a 58 y.o. male who has known carotid stenosis, renal artery stenosis, and PAD. He is here today for carotid artery surveillance. On 09/09/13 Dr. Donnetta Hutching performed a right AKA. He plans to leave the rehab facility in 1-2 months.   Patient has not had previous carotid artery intervention. He is in his wheelchair today, states he walks with a walker at times, he plans on getting a right AKA prosthesis at some point.   Patient has Negative history of TIA or stroke symptom.  The patient denies amaurosis fugax or monocular blindness.  The patient  denies facial drooping.  Pt. denies hemiplegia.  The patient denies receptive or expressive aphasia.    Pt  denies New Medical or Surgical History. He reports that he has not used ETOH since Feb., 2015.  Pt Diabetic: No Pt smoker: former smoker, quit Feb., 2015  Pt meds include: Statin : No ASA: No: states he took ASA in the past; however, he is under evaluation for blood in his stool Other anticoagulants/antiplatelets: no   Past Medical History  Diagnosis Date  . COPD 11/27/2007    Qualifier: Diagnosis of  By: Garen Grams    . ERECTILE DYSFUNCTION 11/27/2007    Qualifier: Diagnosis of  By: Garen Grams    . HYPERLIPIDEMIA 11/27/2007    Qualifier: Diagnosis of  By: Garen Grams    . HYPERTENSION, BENIGN 05/23/2009    Qualifier: Diagnosis of  By: Melvyn Novas MD, Christena Deem   . Coronary artery disease     Cardiac catheterization in 2008 showed 99% stenosis in proximal left circumflex which was treated with Taxus drug-eluting stent. There was mild RCA and LAD disease with normal ejection fraction.  Marland Kitchen PAD (peripheral artery disease)     Previous right SFA stent in 2003. Directional atherectomy right SFA in 01/2013  . Tobacco use   . CHF (congestive heart failure)   . Hypertension   . Asthma   . Traumatic amputation of leg(s) (complete) (partial), unilateral, at or  above knee, without mention of complication   . Pressure ulcer, lower back(707.03)   . Other severe protein-calorie malnutrition   . Anemia, unspecified   . Atrial fibrillation   . Gastric ulcer, unspecified as acute or chronic, without mention of hemorrhage, perforation, or obstruction     Social History History  Substance Use Topics  . Smoking status: Former Smoker -- 1.50 packs/day for 40 years    Quit date: 09/25/2013  . Smokeless tobacco: Never Used  . Alcohol Use: No     Comment: none since moving to nursing home in March 2015    Family History Family History  Problem Relation Age of Onset  . Adopted: Yes  . Heart disease Maternal Grandfather   . Heart disease Paternal Grandfather     Surgical History Past Surgical History  Procedure Laterality Date  . Nasal sinus surgery  2004  . Acne cyst removal    . Other surgical history      blocked artery  . Endarterectomy Left 08/16/2013    Procedure: Exploration of Left Neck/Fix Bleeding;  Surgeon: Elam Dutch, MD;  Location: [facility] OR;  Service: Vascular;  Laterality: Left;  . Esophagogastroduodenoscopy N/A 09/08/2013    Procedure: ESOPHAGOGASTRODUODENOSCOPY (EGD);  Surgeon: Cleotis Nipper, MD;  Location: [facility] ENDOSCOPY;  Service: Endoscopy;  Laterality: N/A;  . Flexible sigmoidoscopy N/A 09/08/2013    Procedure: FLEXIBLE SIGMOIDOSCOPY;  Surgeon: Youlanda Mighty  Buccini, MD;  Location: [place] ENDOSCOPY;  Service: Endoscopy;  Laterality: N/A;  unprepp  . Amputation Right 09/09/2013    Procedure: AMPUTATION ABOVE KNEE;  Surgeon: Rosetta Posner, MD;  Location: [facility] OR;  Service: Vascular;  Laterality: Right;  . Esophagogastroduodenoscopy (egd) with propofol N/A 01/05/2014    Procedure: ESOPHAGOGASTRODUODENOSCOPY (EGD) WITH PROPOFOL;  Surgeon: Cleotis Nipper, MD;  Location: [facility] ENDOSCOPY;  Service: Endoscopy;  Laterality: N/A;    Allergies  Allergen Reactions  . Codeine Nausea Only    Current Outpatient Prescriptions  Medication Sig  Dispense Refill  . acetaminophen (TYLENOL) 325 MG tablet Take 2 tablets (650 mg total) by mouth every 6 (six) hours as needed for mild pain, fever or headache.      . albuterol (PROVENTIL HFA;VENTOLIN HFA) 108 (90 BASE) MCG/ACT inhaler Inhale 2 puffs into the lungs every 6 (six) hours as needed for wheezing or shortness of breath.  1 Inhaler  2  . albuterol (PROVENTIL) (2.5 MG/3ML) 0.083% nebulizer solution Take 3 mLs (2.5 mg total) by nebulization every 2 (two) hours as needed for wheezing or shortness of breath.  75 mL  12  . aluminum & magnesium hydroxide-simethicone (MYLANTA) 500-450-40 MG/5ML suspension Take 30 mLs by mouth every 4 (four) hours as needed for indigestion.      . Amino Acids-Protein Hydrolys (FEEDING SUPPLEMENT, PRO-STAT SUGAR FREE 64,) LIQD Take 30 mLs by mouth 3 (three) times daily with meals.      . colchicine 0.6 MG tablet Take 0.6 mg by mouth daily.       Marland Kitchen ENSURE (ENSURE) Take 237 mLs by mouth at bedtime.      . ferrous fumarate-iron polysaccharide complex (TANDEM) 162-115.2 MG CAPS Take 1 capsule by mouth daily with breakfast.  30 capsule    . folic acid (FOLVITE) 1 MG tablet Take 1 tablet (1 mg total) by mouth daily.      . furosemide (LASIX) 20 MG tablet Take 20 mg by mouth daily.       Marland Kitchen lisinopril (PRINIVIL,ZESTRIL) 2.5 MG tablet Take 2.5 mg by mouth every morning.      . mirtazapine (REMERON SOL-TAB) 15 MG disintegrating tablet Take 15 mg by mouth at bedtime.      . Nutritional Supplements (ENSURE CLEAR) LIQD Take 237 mLs by mouth 2 (two) times daily.      . Oxycodone HCl 10 MG TABS Take one tablet by mouth every 4 hours as needed for pain  180 tablet  0  . pantoprazole (PROTONIX) 40 MG tablet Take 1 tablet (40 mg total) by mouth 2 (two) times daily.      . sucralfate (CARAFATE) 1 GM/10ML suspension Take 10 mLs (1 g total) by mouth 4 (four) times daily -  with meals and at bedtime.  420 mL  0  . thiamine 100 MG tablet Take 1 tablet (100 mg total) by mouth daily.       Marland Kitchen zolpidem (AMBIEN) 10 MG tablet Take one tablet by mouth at bedtime as needed for rest  30 tablet  5   No current facility-administered medications for this visit.    Review of Systems : See HPI for pertinent positives and negatives.  Physical Examination   Filed Vitals:   02/16/14 1218 02/16/14 1219  BP: 113/78 116/72  Pulse: 86 85  Resp:  16  Height:  5\' 8"  (1.727 m)  Weight:  155 lb (70.308 kg)  SpO2:  97%   Body mass index is 23.57 kg/(m^2).  General: WDWN male in NAD GAIT: in wheelchair Eyes: PERRLA Pulmonary:  Non-labored, CTAB, Negative  Rales, Negative rhonchi, & Negative wheezing.  Cardiac: regular Rhythm ,  Negative detected murmur.  VASCULAR EXAM Carotid Bruits Left Right   Negative Negative    Aorta is not palpable. Radial pulses are 2+ palpable and equal.                                                                                                                            LE Pulses LEFT RIGHT       FEMORAL  not palpable  not palpable        POPLITEAL  not palpable   AKA       POSTERIOR TIBIAL  not palpable  AKA        DORSALIS PEDIS      ANTERIOR TIBIAL not palpable  AKA     Gastrointestinal: soft, nontender, BS WNL, no r/g,  negative masses.  Musculoskeletal: Negative muscle atrophy/wasting. M/S 5/5 throughout, Extremities without ischemic changes.  3+ pitting and non pitting edema in left foot with venous stasis changes to lower leg, hemosiderin deposits. Right AKA, stump site well healed.  Neurologic: A&O X 3; Appropriate Affect ; SENSATION ;normal;  Speech is normal CN 2-12 intact, Pain and light touch intact in extremities, Motor exam as listed above.   Non-Invasive Vascular Imaging CAROTID DUPLEX 02/16/2014   CEREBROVASCULAR DUPLEX EVALUATION    INDICATION: Carotid artery disease     PREVIOUS INTERVENTION(S):     DUPLEX EXAM:     RIGHT  LEFT  Peak Systolic Velocities (cm/s) End Diastolic Velocities (cm/s) Plaque LOCATION  Peak Systolic Velocities (cm/s) End Diastolic Velocities (cm/s) Plaque  96 18  CCA PROXIMAL 84 20   73 18  CCA MID 67 18   48 15  CCA DISTAL 58 16   103 17  ECA 397 85   142 48 HT ICA PROXIMAL 173 64 HT  146 30  ICA MID 128 58 HT  77 26  ICA DISTAL 112 43     2.0 ICA / CCA Ratio (PSV) 2.58  Antegrade  Vertebral Flow Antegrade   798 Brachial Systolic Pressure (mmHg) 921  Triphasic  Brachial Artery Waveforms Triphasic     Plaque Morphology:  HM = Homogeneous, HT = Heterogeneous, CP = Calcific Plaque, SP = Smooth Plaque, IP = Irregular Plaque     ADDITIONAL FINDINGS:     IMPRESSION: Right internal carotid artery velocities suggest a 40-59% stenosis.  Left internal carotid artery velocities suggest a 40-59% stenosis.     Compared to the previous exam:  No prior exam performed at this facility for comparison.     Assessment: Edwin [name] is a 58 y.o. male who has known carotid artery stenosis, renal artery stenosis, and is s/p right AKA with history of PAD.  His blood pressure is in good control today. He has no history of stroke or TIA, he stopped  tobacco use in February this year, he does not have DM,  and has no non healing wounds in his lower extremities. Carotid artery Duplex today demonstrates 40-59% stenosis in both internal carotid arteries.  No prior exam performed at this facility for comparison.    Plan: Follow-up in 1 year with Carotid Duplex scan and left LE ABI.  I discussed in depth with the patient the nature of atherosclerosis, and emphasized the importance of maximal medical management including strict control of blood pressure, blood glucose, and lipid levels, obtaining regular exercise, and continued cessation of smoking.  The patient is aware that without maximal medical management the underlying atherosclerotic disease process will progress, limiting the benefit of any interventions. The patient was given information about stroke prevention and what symptoms  should prompt the patient to seek immediate medical care.  Thank you for allowing [place] to participate in this patient's care.  Clemon Chambers, RN, MSN, FNP-C [facility] Office: [facility] Physician: Oneida Alar  02/16/2014 12:26 PM

## 2014-02-17 ENCOUNTER — Other Ambulatory Visit: Payer: Self-pay | Admitting: Otolaryngology

## 2014-02-23 ENCOUNTER — Ambulatory Visit (INDEPENDENT_AMBULATORY_CARE_PROVIDER_SITE_OTHER): Payer: Medicaid Other | Admitting: Otolaryngology

## 2014-02-23 DIAGNOSIS — D37039 Neoplasm of uncertain behavior of the major salivary glands, unspecified: Secondary | ICD-10-CM

## 2014-03-01 ENCOUNTER — Other Ambulatory Visit: Payer: Self-pay | Admitting: *Deleted

## 2014-03-01 MED ORDER — OXYCODONE HCL 10 MG PO TABS: ORAL_TABLET | ORAL | Status: DC

## 2014-03-01 NOTE — Telephone Encounter (Signed)
[facility] 

## 2014-03-06 ENCOUNTER — Other Ambulatory Visit: Payer: Self-pay | Admitting: Internal Medicine

## 2014-03-06 ENCOUNTER — Ambulatory Visit (HOSPITAL_COMMUNITY)
Admission: RE | Admit: 2014-03-06 | Discharge: 2014-03-06 | Disposition: A | Discharge status: Home / Self Care | Payer: Medicaid Other | Source: Ambulatory Visit | Attending: Internal Medicine | Admitting: Internal Medicine

## 2014-03-06 DIAGNOSIS — M25512 Pain in left shoulder: Secondary | ICD-10-CM

## 2014-03-06 DIAGNOSIS — M19019 Primary osteoarthritis, unspecified shoulder: Secondary | ICD-10-CM | POA: Diagnosis not present

## 2014-03-06 DIAGNOSIS — M25511 Pain in right shoulder: Secondary | ICD-10-CM

## 2014-03-06 DIAGNOSIS — M25519 Pain in unspecified shoulder: Secondary | ICD-10-CM | POA: Diagnosis present

## 2014-03-08 ENCOUNTER — Non-Acute Institutional Stay (SKILLED_NURSING_FACILITY): Payer: Medicaid Other | Admitting: Internal Medicine

## 2014-03-08 DIAGNOSIS — R635 Abnormal weight gain: Secondary | ICD-10-CM

## 2014-03-09 NOTE — Progress Notes (Signed)
Reviewed case with dr fitzgerald-needs to be done main or-Edwin [name] at dr Velvet Bathe office notified

## 2014-03-09 NOTE — Progress Notes (Signed)
Patient ID: Edwin [name], male   DOB: 06-01-1956, 59 y.o.   MRN: [id number]           PROGRESS NOTE  DATE: 03/08/2014          FACILITY:  [facility]  LEVEL OF CARE: SNF (31)  Acute Visit  CHIEF COMPLAINT:  Manage weight gain.    HISTORY OF PRESENT ILLNESS: I was requested by the staff to assess the patient regarding above problem(s):  Staff report that patient has had a 16-pound weight gain this month.  Per staff, he eats snacks excessively and also eats 100% of meals.  Patient is aware of weight gain.  He denies shortness of breath or increased lower extremity swelling.  He also has a history of CHF.  Patient's last recorded weight on 02/28/2014 was 166.8.    PAST MEDICAL HISTORY : Reviewed.  No changes/see problem list  CURRENT MEDICATIONS: Reviewed per MAR/see medication list  REVIEW OF SYSTEMS:  GENERAL: weight gain; no change in appetite, no fatigue, no fever, chills or weakness RESPIRATORY: no cough, SOB, DOE,, wheezing, hemoptysis CARDIAC: no chest pain or palpitations;  chronic lower extremity swelling         GI: no abdominal pain, diarrhea, constipation, heart burn, nausea or vomiting  PHYSICAL EXAMINATION  VS: see VS section  GENERAL: no acute distress, normal body habitus NECK: supple, trachea midline, no neck masses, no thyroid tenderness, no thyromegaly RESPIRATORY: breathing is even & unlabored, BS CTAB CARDIAC: RRR, no murmur,no extra heart sounds, +2 bilateral lower extremity edema, patient has right AKA          GI: abdomen soft, normal BS, no masses, no tenderness, no hepatomegaly, no splenomegaly PSYCHIATRIC: the patient is alert & oriented to person, affect & behavior appropriate  LABS/RADIOLOGY:  In 12/2013:  Albumin 3.1, BUN 48, creatinine 1.41, otherwise CMP normal.      ASSESSMENT/PLAN:  Weight gain.  New problem.  Increase Lasix to 20 mg b.i.d. for four days, then decrease to 20 mg q.d.  Check TSH and BNP.    CPT CODE: 73532             Dougles Kimmey Y Zykeem Bauserman, [place] [phone]

## 2014-03-14 ENCOUNTER — Ambulatory Visit (HOSPITAL_BASED_OUTPATIENT_CLINIC_OR_DEPARTMENT_OTHER): Admission: RE | Admit: 2014-03-14 | Payer: Medicaid Other | Source: Ambulatory Visit | Admitting: Otolaryngology

## 2014-03-14 ENCOUNTER — Encounter (HOSPITAL_BASED_OUTPATIENT_CLINIC_OR_DEPARTMENT_OTHER): Admission: RE | Payer: Self-pay | Source: Ambulatory Visit

## 2014-03-14 SURGERY — EXCISION, PAROTID GLAND
Anesthesia: General | Laterality: Right

## 2014-03-21 ENCOUNTER — Encounter (HOSPITAL_COMMUNITY): Payer: Self-pay

## 2014-03-24 ENCOUNTER — Other Ambulatory Visit: Payer: Self-pay | Admitting: *Deleted

## 2014-03-24 MED ORDER — ZOLPIDEM TARTRATE 10 MG PO TABS: 10.0000 mg | ORAL_TABLET | Freq: Every evening | ORAL | Status: DC | PRN

## 2014-03-24 NOTE — Telephone Encounter (Signed)
[facility] 

## 2014-03-28 ENCOUNTER — Encounter (HOSPITAL_COMMUNITY): Payer: Self-pay | Admitting: *Deleted

## 2014-03-28 MED ORDER — PROMETHAZINE HCL 25 MG/ML IJ SOLN: 6.2500 mg | INTRAMUSCULAR | Status: DC | PRN

## 2014-03-28 MED ORDER — LACTATED RINGERS IV SOLN: INTRAVENOUS | Status: DC

## 2014-03-28 NOTE — Anesthesia Preprocedure Evaluation (Addendum)
Anesthesia Evaluation  Patient identified by MRN, date of birth, ID band Patient awake    Reviewed: Allergy & Precautions, H&P , NPO status , Patient's Chart, lab work & pertinent test results  Airway Mallampati: II TM Distance: >3 FB     Dental  (+) Poor Dentition, Dental Advisory Given Missing many teeth.  Only one top incisor.  Missing some lower incisors.  Advisory given and understood:   Pulmonary asthma , COPDformer smoker (quit 5 months ago),  breath sounds clear to auscultation        Cardiovascular hypertension, Pt. on medications + CAD (stent to CX, LAD <50% stenosisECHO 2,2015 EF 55-60%) PERIPHERAL VASCULAR DISEASE: severe, S/P AKA.     Neuro/Psych Anxiety Depression Self inflicted box cutter wound to neck in pastBilateral CA stenosis 50-60%    GI/Hepatic   Endo/Other    Renal/GU Renal InsufficiencyRenal diseaseGFR about 60     Musculoskeletal   Abdominal (+)  Abdomen: soft.    Peds  Hematology  (+) anemia ,   Anesthesia Other Findings Will evaluate in surgical position before GA to verify no dizzyness with head turned 2nd carotid stenosis bilat.  Also watch BIS  Reproductive/Obstetrics                        Anesthesia Physical Anesthesia Plan  ASA: IV  Anesthesia Plan: General   Post-op Pain Management:    Induction: Intravenous  Airway Management Planned: Oral ETT  Additional Equipment:   Intra-op Plan:   Post-operative Plan:   Informed Consent: I have reviewed the patients History and Physical, chart, labs and discussed the procedure including the risks, benefits and alternatives for the proposed anesthesia with the patient or authorized representative who has indicated his/her understanding and acceptance.     Plan Discussed with:   Anesthesia Plan Comments: (Anesthetic for BKA requred reduced doses of induction agents and neo and low dose epi.  Probably septic at  that time. )        Anesthesia Quick Evaluation
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
## 2014-03-29 ENCOUNTER — Ambulatory Visit (HOSPITAL_COMMUNITY)
Admission: RE | Admit: 2014-03-29 | Discharge: 2014-03-30 | Disposition: A | Discharge status: Home / Self Care | Payer: Medicaid Other | Source: Ambulatory Visit | Attending: Otolaryngology | Admitting: Otolaryngology

## 2014-03-29 ENCOUNTER — Encounter (HOSPITAL_COMMUNITY): Payer: Self-pay | Admitting: *Deleted

## 2014-03-29 ENCOUNTER — Ambulatory Visit (HOSPITAL_COMMUNITY): Payer: Medicaid Other | Admitting: Vascular Surgery

## 2014-03-29 ENCOUNTER — Encounter (HOSPITAL_COMMUNITY)
Admission: RE | Disposition: A | Discharge status: Home / Self Care | Payer: Self-pay | Source: Ambulatory Visit | Attending: Otolaryngology

## 2014-03-29 ENCOUNTER — Encounter (HOSPITAL_COMMUNITY): Payer: Medicaid Other | Admitting: Vascular Surgery

## 2014-03-29 ENCOUNTER — Ambulatory Visit (HOSPITAL_COMMUNITY): Payer: Medicaid Other

## 2014-03-29 DIAGNOSIS — D37039 Neoplasm of uncertain behavior of the major salivary glands, unspecified: Secondary | ICD-10-CM

## 2014-03-29 DIAGNOSIS — D119 Benign neoplasm of major salivary gland, unspecified: Secondary | ICD-10-CM | POA: Diagnosis not present

## 2014-03-29 DIAGNOSIS — J449 Chronic obstructive pulmonary disease, unspecified: Secondary | ICD-10-CM | POA: Insufficient documentation

## 2014-03-29 DIAGNOSIS — I10 Essential (primary) hypertension: Secondary | ICD-10-CM | POA: Diagnosis not present

## 2014-03-29 DIAGNOSIS — F3289 Other specified depressive episodes: Secondary | ICD-10-CM | POA: Diagnosis not present

## 2014-03-29 DIAGNOSIS — Z885 Allergy status to narcotic agent status: Secondary | ICD-10-CM | POA: Diagnosis not present

## 2014-03-29 DIAGNOSIS — K119 Disease of salivary gland, unspecified: Secondary | ICD-10-CM | POA: Diagnosis present

## 2014-03-29 DIAGNOSIS — Z9889 Other specified postprocedural states: Secondary | ICD-10-CM

## 2014-03-29 DIAGNOSIS — Z87891 Personal history of nicotine dependence: Secondary | ICD-10-CM | POA: Insufficient documentation

## 2014-03-29 DIAGNOSIS — F329 Major depressive disorder, single episode, unspecified: Secondary | ICD-10-CM | POA: Diagnosis not present

## 2014-03-29 DIAGNOSIS — J4489 Other specified chronic obstructive pulmonary disease: Secondary | ICD-10-CM | POA: Insufficient documentation

## 2014-03-29 DIAGNOSIS — I2510 Atherosclerotic heart disease of native coronary artery without angina pectoris: Secondary | ICD-10-CM | POA: Insufficient documentation

## 2014-03-29 DIAGNOSIS — F411 Generalized anxiety disorder: Secondary | ICD-10-CM | POA: Insufficient documentation

## 2014-03-29 HISTORY — PX: PAROTIDECTOMY: SHX2163

## 2014-03-29 LAB — CBC
HCT: 40 % (ref 39.0–52.0)
Hemoglobin: 13 g/dL (ref 13.0–17.0)
MCH: 27.3 pg (ref 26.0–34.0)
MCHC: 32.5 g/dL (ref 30.0–36.0)
MCV: 84 fL (ref 78.0–100.0)
PLATELETS: 202 10*3/uL (ref 150–400)
RBC: 4.76 MIL/uL (ref 4.22–5.81)
RDW: 15.5 % (ref 11.5–15.5)
WBC: 5.7 10*3/uL (ref 4.0–10.5)

## 2014-03-29 LAB — BASIC METABOLIC PANEL
Anion gap: 12 (ref 5–15)
BUN: 27 mg/dL — ABNORMAL HIGH (ref 6–23)
CALCIUM: 9.6 mg/dL (ref 8.4–10.5)
CO2: 27 mEq/L (ref 19–32)
CREATININE: 1.23 mg/dL (ref 0.50–1.35)
Chloride: 100 mEq/L (ref 96–112)
GFR calc non Af Amer: 64 mL/min — ABNORMAL LOW (ref >90)
GFR, EST AFRICAN AMERICAN: 74 mL/min — AB (ref >90)
Glucose, Bld: 99 mg/dL (ref 70–99)
Potassium: 4.5 mEq/L (ref 3.7–5.3)
Sodium: 139 mEq/L (ref 137–147)

## 2014-03-29 SURGERY — EXCISION, PAROTID GLAND
Anesthesia: General | Site: Neck | Laterality: Right

## 2014-03-29 MED ORDER — PROPOFOL 10 MG/ML IV BOLUS
INTRAVENOUS | Status: AC
  Filled 2014-03-29: qty 20

## 2014-03-29 MED ORDER — ONDANSETRON HCL 4 MG/2ML IJ SOLN
INTRAMUSCULAR | Status: AC
  Filled 2014-03-29: qty 4

## 2014-03-29 MED ORDER — FENTANYL CITRATE 0.05 MG/ML IJ SOLN
INTRAMUSCULAR | Status: AC
  Administered 2014-03-29: 25 ug via INTRAVENOUS
  Filled 2014-03-29: qty 2

## 2014-03-29 MED ORDER — ALBUTEROL SULFATE (2.5 MG/3ML) 0.083% IN NEBU
2.0000 mL | INHALATION_SOLUTION | Freq: Four times a day (QID) | RESPIRATORY_TRACT | Status: DC | PRN
  Filled 2014-03-29: qty 3

## 2014-03-29 MED ORDER — OXYCODONE HCL 5 MG PO TABS
10.0000 mg | ORAL_TABLET | ORAL | Status: DC | PRN
  Administered 2014-03-29 – 2014-03-30 (×6): 10 mg via ORAL
  Filled 2014-03-29 (×6): qty 2

## 2014-03-29 MED ORDER — MIDAZOLAM HCL 2 MG/2ML IJ SOLN
INTRAMUSCULAR | Status: AC
  Filled 2014-03-29: qty 2

## 2014-03-29 MED ORDER — KETAMINE HCL 10 MG/ML IJ SOLN
INTRAMUSCULAR | Status: DC | PRN
  Administered 2014-03-29 (×3): 30 mg via INTRAVENOUS

## 2014-03-29 MED ORDER — ZOLPIDEM TARTRATE 5 MG PO TABS
10.0000 mg | ORAL_TABLET | Freq: Every evening | ORAL | Status: DC | PRN
  Administered 2014-03-29: 10 mg via ORAL
  Filled 2014-03-29: qty 2

## 2014-03-29 MED ORDER — PANTOPRAZOLE SODIUM 40 MG PO TBEC
40.0000 mg | DELAYED_RELEASE_TABLET | Freq: Two times a day (BID) | ORAL | Status: DC
  Administered 2014-03-29 – 2014-03-30 (×2): 40 mg via ORAL
  Filled 2014-03-29 (×3): qty 1

## 2014-03-29 MED ORDER — DEXMEDETOMIDINE HCL 200 MCG/2ML IV SOLN
INTRAVENOUS | Status: DC | PRN
  Administered 2014-03-29: 8 ug via INTRAVENOUS
  Administered 2014-03-29: 12 ug via INTRAVENOUS

## 2014-03-29 MED ORDER — MORPHINE SULFATE 2 MG/ML IJ SOLN
2.0000 mg | INTRAMUSCULAR | Status: DC | PRN
  Administered 2014-03-29: 2 mg via INTRAVENOUS
  Filled 2014-03-29: qty 1

## 2014-03-29 MED ORDER — 0.9 % SODIUM CHLORIDE (POUR BTL) OPTIME
TOPICAL | Status: DC | PRN
  Administered 2014-03-29: 1000 mL

## 2014-03-29 MED ORDER — PROMETHAZINE HCL 25 MG PO TABS: 25.0000 mg | ORAL_TABLET | Freq: Four times a day (QID) | ORAL | Status: DC | PRN

## 2014-03-29 MED ORDER — LIDOCAINE-EPINEPHRINE 2 %-1:100000 IJ SOLN
INTRAMUSCULAR | Status: AC
  Filled 2014-03-29: qty 1

## 2014-03-29 MED ORDER — ALBUTEROL SULFATE (2.5 MG/3ML) 0.083% IN NEBU
INHALATION_SOLUTION | RESPIRATORY_TRACT | Status: AC
  Filled 2014-03-29: qty 3

## 2014-03-29 MED ORDER — LACTATED RINGERS IV SOLN
INTRAVENOUS | Status: DC | PRN
  Administered 2014-03-29 (×2): via INTRAVENOUS

## 2014-03-29 MED ORDER — KETAMINE HCL 100 MG/ML IJ SOLN
INTRAMUSCULAR | Status: AC
  Filled 2014-03-29: qty 1

## 2014-03-29 MED ORDER — KCL IN DEXTROSE-NACL 20-5-0.45 MEQ/L-%-% IV SOLN
INTRAVENOUS | Status: DC
  Administered 2014-03-29 – 2014-03-30 (×2): via INTRAVENOUS
  Filled 2014-03-29 (×4): qty 1000

## 2014-03-29 MED ORDER — IPRATROPIUM-ALBUTEROL 0.5-2.5 (3) MG/3ML IN SOLN
3.0000 mL | Freq: Once | RESPIRATORY_TRACT | Status: AC
  Administered 2014-03-29: 3 mL via RESPIRATORY_TRACT

## 2014-03-29 MED ORDER — CEFAZOLIN SODIUM 1-5 GM-% IV SOLN
1.0000 g | Freq: Three times a day (TID) | INTRAVENOUS | Status: AC
  Administered 2014-03-29 (×2): 1 g via INTRAVENOUS
  Filled 2014-03-29 (×2): qty 50

## 2014-03-29 MED ORDER — FOLIC ACID 1 MG PO TABS
1.0000 mg | ORAL_TABLET | Freq: Every day | ORAL | Status: DC
  Administered 2014-03-30: 1 mg via ORAL
  Filled 2014-03-29 (×2): qty 1

## 2014-03-29 MED ORDER — MIDAZOLAM HCL 5 MG/5ML IJ SOLN
INTRAMUSCULAR | Status: DC | PRN
  Administered 2014-03-29 (×2): 2 mg via INTRAVENOUS

## 2014-03-29 MED ORDER — ALBUTEROL SULFATE HFA 108 (90 BASE) MCG/ACT IN AERS
INHALATION_SPRAY | RESPIRATORY_TRACT | Status: DC | PRN
  Administered 2014-03-29 (×2): 4 via RESPIRATORY_TRACT

## 2014-03-29 MED ORDER — ACETAMINOPHEN 325 MG PO TABS: 650.0000 mg | ORAL_TABLET | Freq: Four times a day (QID) | ORAL | Status: DC | PRN

## 2014-03-29 MED ORDER — FENTANYL CITRATE 0.05 MG/ML IJ SOLN
INTRAMUSCULAR | Status: AC
  Filled 2014-03-29: qty 2

## 2014-03-29 MED ORDER — FENTANYL CITRATE 0.05 MG/ML IJ SOLN
INTRAMUSCULAR | Status: AC
  Filled 2014-03-29: qty 5

## 2014-03-29 MED ORDER — IPRATROPIUM-ALBUTEROL 0.5-2.5 (3) MG/3ML IN SOLN
RESPIRATORY_TRACT | Status: AC
  Administered 2014-03-29: 3 mL via RESPIRATORY_TRACT
  Filled 2014-03-29: qty 3

## 2014-03-29 MED ORDER — LIDOCAINE-EPINEPHRINE 2 %-1:100000 IJ SOLN
INTRAMUSCULAR | Status: DC | PRN
  Administered 2014-03-29: 20 mL

## 2014-03-29 MED ORDER — VITAMIN B-1 100 MG PO TABS
100.0000 mg | ORAL_TABLET | Freq: Every day | ORAL | Status: DC
  Administered 2014-03-30: 100 mg via ORAL
  Filled 2014-03-29 (×2): qty 1

## 2014-03-29 MED ORDER — PROPOFOL 10 MG/ML IV BOLUS
INTRAVENOUS | Status: DC | PRN
  Administered 2014-03-29: 100 mg via INTRAVENOUS
  Administered 2014-03-29: 40 mg via INTRAVENOUS

## 2014-03-29 MED ORDER — LISINOPRIL 2.5 MG PO TABS
2.5000 mg | ORAL_TABLET | Freq: Every morning | ORAL | Status: DC
  Administered 2014-03-30: 2.5 mg via ORAL
  Filled 2014-03-29 (×2): qty 1

## 2014-03-29 MED ORDER — FENTANYL CITRATE 0.05 MG/ML IJ SOLN
INTRAMUSCULAR | Status: DC | PRN
  Administered 2014-03-29: 50 ug via INTRAVENOUS
  Administered 2014-03-29 (×2): 100 ug via INTRAVENOUS
  Administered 2014-03-29 (×2): 50 ug via INTRAVENOUS
  Administered 2014-03-29: 100 ug via INTRAVENOUS
  Administered 2014-03-29: 50 ug via INTRAVENOUS

## 2014-03-29 MED ORDER — BACITRACIN ZINC 500 UNIT/GM EX OINT
TOPICAL_OINTMENT | CUTANEOUS | Status: AC
  Filled 2014-03-29: qty 15

## 2014-03-29 MED ORDER — MIRTAZAPINE 15 MG PO TBDP
15.0000 mg | ORAL_TABLET | Freq: Every day | ORAL | Status: DC
  Administered 2014-03-29: 15 mg via ORAL
  Filled 2014-03-29 (×2): qty 1

## 2014-03-29 MED ORDER — FUROSEMIDE 20 MG PO TABS
20.0000 mg | ORAL_TABLET | Freq: Every day | ORAL | Status: DC
  Administered 2014-03-30: 20 mg via ORAL
  Filled 2014-03-29 (×2): qty 1

## 2014-03-29 MED ORDER — ONDANSETRON HCL 4 MG/2ML IJ SOLN
INTRAMUSCULAR | Status: DC | PRN
  Administered 2014-03-29: 4 mg via INTRAVENOUS

## 2014-03-29 MED ORDER — EPHEDRINE SULFATE 50 MG/ML IJ SOLN
INTRAMUSCULAR | Status: DC | PRN
  Administered 2014-03-29 (×2): 10 mg via INTRAVENOUS
  Administered 2014-03-29: 15 mg via INTRAVENOUS
  Administered 2014-03-29: 10 mg via INTRAVENOUS

## 2014-03-29 MED ORDER — PHENYLEPHRINE 40 MCG/ML (10ML) SYRINGE FOR IV PUSH (FOR BLOOD PRESSURE SUPPORT)
PREFILLED_SYRINGE | INTRAVENOUS | Status: AC
  Filled 2014-03-29: qty 20

## 2014-03-29 MED ORDER — LIDOCAINE HCL (CARDIAC) 20 MG/ML IV SOLN
INTRAVENOUS | Status: DC | PRN
  Administered 2014-03-29: 80 mg via INTRAVENOUS

## 2014-03-29 MED ORDER — MEPERIDINE HCL 25 MG/ML IJ SOLN: 6.2500 mg | INTRAMUSCULAR | Status: DC | PRN

## 2014-03-29 MED ORDER — PROMETHAZINE HCL 25 MG/ML IJ SOLN: 6.2500 mg | INTRAMUSCULAR | Status: DC | PRN

## 2014-03-29 MED ORDER — CEFAZOLIN SODIUM 1-5 GM-% IV SOLN
INTRAVENOUS | Status: DC | PRN
  Administered 2014-03-29: 2 g via INTRAVENOUS

## 2014-03-29 MED ORDER — PHENYLEPHRINE HCL 10 MG/ML IJ SOLN
INTRAMUSCULAR | Status: DC | PRN
  Administered 2014-03-29 (×4): 80 ug via INTRAVENOUS
  Administered 2014-03-29: 120 ug via INTRAVENOUS
  Administered 2014-03-29 (×2): 80 ug via INTRAVENOUS

## 2014-03-29 MED ORDER — PROMETHAZINE HCL 25 MG RE SUPP: 25.0000 mg | Freq: Four times a day (QID) | RECTAL | Status: DC | PRN

## 2014-03-29 MED ORDER — COLCHICINE 0.6 MG PO TABS
0.6000 mg | ORAL_TABLET | Freq: Every day | ORAL | Status: DC
  Administered 2014-03-30: 0.6 mg via ORAL
  Filled 2014-03-29 (×2): qty 1

## 2014-03-29 MED ORDER — SUCCINYLCHOLINE CHLORIDE 20 MG/ML IJ SOLN
INTRAMUSCULAR | Status: DC | PRN
  Administered 2014-03-29: 120 mg via INTRAVENOUS

## 2014-03-29 MED ORDER — FENTANYL CITRATE 0.05 MG/ML IJ SOLN
25.0000 ug | INTRAMUSCULAR | Status: DC | PRN
  Administered 2014-03-29 (×3): 25 ug via INTRAVENOUS

## 2014-03-29 MED ORDER — ACETAMINOPHEN 10 MG/ML IV SOLN
INTRAVENOUS | Status: DC | PRN
  Administered 2014-03-29: 1000 mg via INTRAVENOUS

## 2014-03-29 MED ORDER — SODIUM CHLORIDE 0.9 % IJ SOLN
INTRAMUSCULAR | Status: AC
  Filled 2014-03-29: qty 10

## 2014-03-29 MED ORDER — LACTATED RINGERS IV SOLN: INTRAVENOUS | Status: DC

## 2014-03-29 SURGICAL SUPPLY — 40 items
BLADE CLIPPER SPEC (BLADE) ×3 IMPLANT
CANISTER SUCTION 2500CC (MISCELLANEOUS) ×3 IMPLANT
CLEANER TIP ELECTROSURG 2X2 (MISCELLANEOUS) ×3 IMPLANT
CORDS BIPOLAR (ELECTRODE) ×3 IMPLANT
COTTONBALL LRG STERILE PKG (GAUZE/BANDAGES/DRESSINGS) ×3 IMPLANT
COVER SURGICAL LIGHT HANDLE (MISCELLANEOUS) ×3 IMPLANT
DERMABOND ADVANCED (GAUZE/BANDAGES/DRESSINGS) ×2
DERMABOND ADVANCED .7 DNX12 (GAUZE/BANDAGES/DRESSINGS) ×1 IMPLANT
DRAIN CHANNEL 10F 3/8 F FF (DRAIN) ×3 IMPLANT
DRAPE SURG 17X23 STRL (DRAPES) ×3 IMPLANT
ELECT COATED BLADE 2.86 ST (ELECTRODE) ×3 IMPLANT
ELECT PAIRED SUBDERMAL (MISCELLANEOUS) ×3
ELECT REM PT RETURN 9FT ADLT (ELECTROSURGICAL) ×3
ELECTRODE PAIRED SUBDERMAL (MISCELLANEOUS) ×1 IMPLANT
ELECTRODE REM PT RTRN 9FT ADLT (ELECTROSURGICAL) ×1 IMPLANT
EVACUATOR SILICONE 100CC (DRAIN) ×3 IMPLANT
GAUZE SPONGE 4X4 16PLY XRAY LF (GAUZE/BANDAGES/DRESSINGS) ×6 IMPLANT
GLOVE BIO SURGEON STRL SZ 6.5 (GLOVE) ×2 IMPLANT
GLOVE BIO SURGEONS STRL SZ 6.5 (GLOVE) ×1
GLOVE BIOGEL PI IND STRL 7.0 (GLOVE) ×1 IMPLANT
GLOVE BIOGEL PI INDICATOR 7.0 (GLOVE) ×2
GLOVE ECLIPSE 7.5 STRL STRAW (GLOVE) ×3 IMPLANT
GLOVE SURG SS PI 7.0 STRL IVOR (GLOVE) ×3 IMPLANT
GOWN STRL REUS W/ TWL LRG LVL3 (GOWN DISPOSABLE) ×3 IMPLANT
GOWN STRL REUS W/TWL LRG LVL3 (GOWN DISPOSABLE) ×6
KIT BASIN OR (CUSTOM PROCEDURE TRAY) ×3 IMPLANT
KIT ROOM TURNOVER OR (KITS) ×3 IMPLANT
NS IRRIG 1000ML POUR BTL (IV SOLUTION) ×3 IMPLANT
PAD ARMBOARD 7.5X6 YLW CONV (MISCELLANEOUS) ×6 IMPLANT
PENCIL BUTTON HOLSTER BLD 10FT (ELECTRODE) ×3 IMPLANT
PROBE NERVBE PRASS .33 (MISCELLANEOUS) ×3 IMPLANT
SHEARS HARMONIC 9CM CVD (BLADE) ×3 IMPLANT
SPONGE INTESTINAL PEANUT (DISPOSABLE) ×3 IMPLANT
SUT PROLENE 5 0 P 3 (SUTURE) ×3 IMPLANT
SUT SILK 3 0 (SUTURE) ×2
SUT SILK 3 0 SH CR/8 (SUTURE) ×3 IMPLANT
SUT SILK 3-0 18XBRD TIE 12 (SUTURE) ×1 IMPLANT
TOWEL OR 17X24 6PK STRL BLUE (TOWEL DISPOSABLE) ×3 IMPLANT
TOWEL OR 17X26 10 PK STRL BLUE (TOWEL DISPOSABLE) ×3 IMPLANT
TRAY ENT MC OR (CUSTOM PROCEDURE TRAY) ×3 IMPLANT

## 2014-03-29 NOTE — Brief Op Note (Signed)
03/29/2014  12:02 PM  PATIENT:  Harvie Junior  58 y.o. male  PRE-OPERATIVE DIAGNOSIS:  Right parotid mass  POST-OPERATIVE DIAGNOSIS:  Right parotid mass  PROCEDURE:  Procedure(s): LATERAL PAROTIDECTOMY (Right)  SURGEON:  Surgeon(s) and Role:    * Ascencion Dike, MD - Primary  PHYSICIAN ASSISTANT:   ASSISTANTS: Rometta Emery, PA-C   ANESTHESIA:   general  EBL:  Total I/O In: 1500 [I.V.:1500] Out: 25 [Blood:25]  BLOOD ADMINISTERED:none  DRAINS: (#10) Jackson-Pratt drain(s) with closed bulb suction in the right neck   LOCAL MEDICATIONS USED:  LIDOCAINE   SPECIMEN:  Source of Specimen:  Right parotid mass  DISPOSITION OF SPECIMEN:  PATHOLOGY  COUNTS:  YES  TOURNIQUET:  * No tourniquets in log *  DICTATION: .Other Dictation: Dictation Number [id number]  PLAN OF CARE: Admit for overnight observation  PATIENT DISPOSITION:  PACU - hemodynamically stable.   Delay start of Pharmacological VTE agent (>24hrs) due to surgical blood loss or risk of bleeding: no

## 2014-03-29 NOTE — H&P (Signed)
Cc: Right parotid mass  HPI: The patient is a 58 y/o male who returns today for follow up evaluation of a right parotid mass. He was last seen 2 weeks ago. At that time, he was noted to have a large right parotid mass.  An FNA was performed in the office which was noted to be consistent with a pleomorphic adenoma.  The patient returns today with no new complaints.  The patient denies any pain or erythema over the mass. No other ENT, GI, or respiratory issue noted since the last visit.   Exam: General: Communicates without difficulty, well nourished, no acute distress. Head: Normocephalic, no evidence injury, no tenderness, facial buttresses intact without stepoff. Eyes: No scleral icterus, conjunctivae clear. Neuro: CN II exam reveals vision grossly intact. No nystagmus at any point of gaze. Ears: Auricles well formed without lesions. Ear canals are intact without mass or lesion. No erythema or edema is appreciated. The TM is intact without fluid. Nose: External evaluation reveals normal support and skin without lesions. Dorsum is intact. Anterior rhinoscopy reveals healthy pink mucosa over anterior aspect of inferior turbinates and intact septum. No purulence noted. Oral:  Oral cavity and oropharynx are intact, symmetric, without erythema or edema. Mucosa is moist without lesions. Neck: Full range of motion without pain. There is no significant lymphadenopathy. No masses palpable. Thyroid bed within normal limits to palpation. Submandibular glands are equal bilaterally without mass. A 2.8cm firm mass is noted within the tail of the right parotid gland. Trachea is midline. Neuro:  CN 2-12 grossly intact. Wheelchair bound, Right AKA.Vestibular: No nystagmus at any point of gaze.   Assessment A 2.8 cm firm, right parotid mass is noted. 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  Plan 1.  The pathology findings are discussed with the patient. 2.  We will proceed with a right parotidectomy. The risks,  benefits, alternatives, and details of the procedure are reviewed with the patient. Questions are invited and answered.  3.  The procedure will be scheduled in accordance with the patient's schedule.

## 2014-03-29 NOTE — Anesthesia Postprocedure Evaluation (Signed)
  Anesthesia Post-op Note  Patient: Edwin [name]  Procedure(s) Performed: Procedure(s): PAROTIDECTOMY (Right)  Patient Location: PACU  Anesthesia Type:General  Level of Consciousness: awake, alert  and oriented  Airway and Oxygen Therapy: Patient Spontanous Breathing and Patient connected to nasal cannula oxygen  Post-op Pain: mild  Post-op Assessment: Post-op Vital signs reviewed, Patient's Cardiovascular Status Stable and Respiratory Function Stable  Post-op Vital Signs: Reviewed and stable  Last Vitals:  Filed Vitals:   03/29/14 1245  BP: 133/79  Pulse: 101  Temp:   Resp: 12    Complications: No apparent anesthesia complications

## 2014-03-29 NOTE — Transfer of Care (Signed)
Immediate Anesthesia Transfer of Care Note  Patient: Edwin [name]  Procedure(s) Performed: Procedure(s): PAROTIDECTOMY (Right)  Patient Location: PACU  Anesthesia Type:General  Level of Consciousness: awake, alert  and oriented  Airway & Oxygen Therapy: Patient Spontanous Breathing and Patient connected to nasal cannula oxygen  Post-op Assessment: Report given to PACU RN and Post -op Vital signs reviewed and stable  Post vital signs: Reviewed and stable  Complications: No apparent anesthesia complications

## 2014-03-30 ENCOUNTER — Inpatient Hospital Stay
Admission: RE | Admit: 2014-03-30 | Discharge: 2014-09-22 | Disposition: A | Discharge status: Still In Hospital | Payer: Medicaid Other | Source: Ambulatory Visit | Attending: Internal Medicine | Admitting: Internal Medicine

## 2014-03-30 ENCOUNTER — Encounter (HOSPITAL_COMMUNITY): Payer: Self-pay | Admitting: Otolaryngology

## 2014-03-30 DIAGNOSIS — D119 Benign neoplasm of major salivary gland, unspecified: Secondary | ICD-10-CM | POA: Diagnosis not present

## 2014-03-30 DIAGNOSIS — R0989 Other specified symptoms and signs involving the circulatory and respiratory systems: Principal | ICD-10-CM

## 2014-03-30 MED ORDER — AMOXICILLIN 875 MG PO TABS: 875.0000 mg | ORAL_TABLET | Freq: Two times a day (BID) | ORAL | Status: DC

## 2014-03-30 NOTE — Discharge Instructions (Signed)
Parotidectomy Care After Refer to this sheet in the next few weeks. These instructions provide you with information on caring for yourself after your procedure. Your caregiver may also give you more specific instructions. Your treatment has been planned according to current medical practices, but problems sometimes occur. Call your caregiver if you have any problems or questions after your procedure. HOME CARE INSTRUCTIONS Wound care  Keep your incision clean after the dressing is off.  Wash it with mild soap and water. Pat it dry with a clean towel. Do not rub the incision.  You may need to put an antibiotic cream on the incision for a while. This is medicine that fights germs.  Check your incision every day to make sure that it is not red.  You can shower about 1 day after the drain is out. You must keep the incision area dry.  Your stitches will be taken out after about 7 days. Pain  Some pain is normal after a parotidectomy. Take whatever pain medicine your surgeon prescribes. Follow the directions carefully.  Resume your oxycodone/tylenol.Some painkillers, like aspirin, can cause bleeding. Diet  You can eat like you normally do once you are home. However, it might hurt to chew for a while. Stay away from foods that are hard to chew.  It may help to take your pain medicine about 30 minutes before you eat. Other precautions  Keep your head propped up when you lie down. Try using 2 pillows to do this. Do it for about 2 weeks after your surgery.  You can probably go back to your normal routine after a few days.However, do not do anything that requires great effort until your surgeon says it is okay. SEEK MEDICAL CARE IF:  You have any questions about your medicines.  Pain does not go away, even after taking pain medicine.  You vomit or feel nauseous. SEEK IMMEDIATE MEDICAL CARE IF:   You are taking pain medicine but your pain gets much worse.  Yourincision looks red and  swollen or blood or fluid leaks from the wound.  Skin on your ear or face gets red and swollen.  Your face is numb or feels weak.  You have a fever. MAKE SURE YOU:  Understand these instructions.  Will watch your condition.  Will get help right away if you are not doing well or get worse. Document Released: 08/16/2010 Document Revised: 10/06/2011 Document Reviewed: 08/16/2010 [facility] Patient Information 2015 [place], [place]. This information is not intended to replace advice given to you by your health care provider. Make sure you discuss any questions you have with your health care provider.

## 2014-03-30 NOTE — Progress Notes (Signed)
Report given to Domenica Fail at the [facility] on patient.

## 2014-03-30 NOTE — H&P (Deleted)
Edwin [name], Edwin [name]              ACCOUNT NO.:  [id number]  MEDICAL RECORD NO.:  [id number]  LOCATION:                               FACILITY:  [facility]  PHYSICIAN:  Leta Baptist, MD            DATE OF BIRTH:  [birth year]  DATE OF PROCEDURE:  03/29/2014 DATE OF DISCHARGE:  03/30/2014                              OPERATIVE REPORT   SURGEON:  Leta Baptist, MD  ASSISTANT:  Alferd Patee, PA-C  PREOPERATIVE DIAGNOSIS:  Right parotid mass.  POSTOPERATIVE DIAGNOSIS:  Right parotid mass.  PROCEDURE PERFORMED:  Right lateral parotidectomy.  ANESTHESIA:  General endotracheal tube anesthesia.  COMPLICATIONS:  None.  ESTIMATED BLOOD LOSS:  Minimal.  INDICATION FOR PROCEDURE:  The patient is a 58 year old male with a gradually enlarging right parotid mass.  He previously underwent fine needle aspiration biopsy of the mass.  The result was consistent with a pleomorphic adenoma.  The size of the mass was noted to be approximately 2.8 cm.  Based on the above findings, the decision was made for the patient to undergo the above stated procedure.  The risks, benefits, alternatives, and details of the procedure were discussed with the patient.  Questions were invited and answered.  Informed consent was obtained.  DESCRIPTION:  The patient was taken to the operating room and placed supine on the operating table.  General endotracheal tube anesthesia was administered by the anesthesiologist.  Preop IV antibiotic was given. The patient was positioned and prepped and draped in the standard fashion for the right parotidectomy surgery.  A 1% lidocaine with 1:100,000 epinephrine was injected at the planned site of incision.  The facial nerve electrodes were placed.  The patient's facial nerve monitoring system was functional throughout the case.  A standard right preauricular parotid incision was made.  The incision was curved down to the right lateral neck.  SMAS skin flap was elevated in a standard  fashion.  At this time, a 3-cm firm mass was noted within the tail of the parotid.  Attention was first focused on identifying the facial nerve.  Careful dissection was carried out anterior to the right auricular cartilage.  The dissection was carried down to the level of the facial nerve main trunk.  The main trunk of the facial nerve was then dissected free from the surrounding parotid tissue.  Dissection was then carried out anteriorly along the branches of the facial nerve. Both superior and inferior divisions of the facial nerve were identified and preserved.  The entire mass was dissected free from the facial nerve without difficulty.  The entire mass was then sent to the Pathology Department for permanent histologic identification.  The surgical site was copiously irrigated.  A #10 JP drain was placed.  The incision was closed in layers with 4-0 Vicryl, 5-0 Prolene, and Dermabond.  The care of the patient was turned over to the anesthesiologist.  The patient was awakened from anesthesia without difficulty.  He was extubated and transferred to the recovery room in good condition.  OPERATIVE FINDINGS:  A nearly 3 cm mass was noted within the right tail of the parotid.  The entire mass was removed.  The facial nerve and all branches of the facial nerve were identified and preserved.  SPECIMEN:  Right parotid mass.  FOLLOWUP CARE:  The patient will be observed overnight in the hospital. He will most likely be discharged home once he is awake and alert.     Leta Baptist, MD     ST/MEDQ  D:  03/29/2014  T:  03/30/2014  Job:  [id number]

## 2014-03-30 NOTE — Discharge Summary (Signed)
Physician Discharge Summary  Patient ID: MALVERN KADLEC MRN: [id number] DOB/AGE: Jan 20, 1956 58 y.o.  Admit date: 03/29/2014 Discharge date: 03/30/2014  Admission Diagnoses: s/p right parotidectomy  Discharge Diagnoses: s/p right parotidectomy Active Problems:   H/O superficial parotidectomy   Discharged Condition: good  Hospital Course: Pt had an uneventful overnight stay. Pt tolerated po well. No bleeding. No stridor. No facial nerve weakness.  Consults: None  Significant Diagnostic Studies: none  Treatments: surgery: Right lateral parotidectomy  Discharge Exam: Blood pressure 165/88, pulse 77, temperature 98.2 F (36.8 C), temperature source Oral, resp. rate 17, height 5\' 8"  (1.727 m), weight 180 lb 9.6 oz (81.92 kg), SpO2 97.00%. Incision/Wound: c/d/i  Disposition: 04-Intermediate Care Facility  Discharge Instructions   Activity as tolerated - No restrictions    Complete by:  As directed      Diet general    Complete by:  As directed             Medication List         acetaminophen 325 MG tablet  Commonly known as:  TYLENOL  Take 2 tablets (650 mg total) by mouth every 6 (six) hours as needed for mild pain, fever or headache.     albuterol (2.5 MG/3ML) 0.083% nebulizer solution  Commonly known as:  PROVENTIL  Take 3 mLs (2.5 mg total) by nebulization every 2 (two) hours as needed for wheezing or shortness of breath.     albuterol 108 (90 BASE) MCG/ACT inhaler  Commonly known as:  PROVENTIL HFA;VENTOLIN HFA  Inhale 2 puffs into the lungs every 6 (six) hours as needed for wheezing or shortness of breath.     ALOE VESTA PROTECTIVE Oint  Apply 1 application topically 2 (two) times daily.     amoxicillin 875 MG tablet  Commonly known as:  AMOXIL  Take 1 tablet (875 mg total) by mouth 2 (two) times daily.     colchicine 0.6 MG tablet  Take 0.6 mg by mouth daily.     ferrous fumarate-iron polysaccharide complex 162-115.2 MG Caps  Commonly known as:  TANDEM   Take 1 capsule by mouth 2 (two) times daily.     folic acid 1 MG tablet  Commonly known as:  FOLVITE  Take 1 tablet (1 mg total) by mouth daily.     furosemide 20 MG tablet  Commonly known as:  LASIX  Take 20 mg by mouth daily.     lisinopril 2.5 MG tablet  Commonly known as:  PRINIVIL,ZESTRIL  Take 2.5 mg by mouth every morning.     mirtazapine 15 MG disintegrating tablet  Commonly known as:  REMERON SOL-TAB  Take 15 mg by mouth at bedtime.     MYLANTA 200-200-20 MG/5ML suspension  Generic drug:  alum & mag hydroxide-simeth  Take 30 mLs by mouth every 4 (four) hours as needed for indigestion or heartburn.     Oxycodone HCl 10 MG Tabs  Take 10 mg by mouth every 4 (four) hours as needed (pain).     pantoprazole 40 MG tablet  Commonly known as:  PROTONIX  Take 1 tablet (40 mg total) by mouth 2 (two) times daily.     thiamine 100 MG tablet  Take 1 tablet (100 mg total) by mouth daily.     zolpidem 10 MG tablet  Commonly known as:  AMBIEN  Take 1 tablet (10 mg total) by mouth at bedtime as needed for sleep.           Follow-up  Information   Follow up with Ascencion Dike, MD On 04/06/2014. (at 1:30 pm)    Specialty:  Otolaryngology   Contact information:   [address] [place] [address] [phone]       Signed: Ascencion Dike 03/30/2014, 8:35 AM

## 2014-03-30 NOTE — Progress Notes (Signed)
RN called to inform me that pt was wheezing and asked to come assess the pt for a nebulizer. Upon entering room I spoke with pt and explained the nurse had called and said he was wheezing. He stated "yes but he did not want a treatment". RN made aware.

## 2014-03-30 NOTE — Progress Notes (Signed)
Patient is from [facility] and will discharge back today. Plans confirmed with patient and family who are in agreement. SNF is prepared for patient and we will  plan transfer via [facility].   Charlene Brooke, MSW Clinical Social Worker [phone]

## 2014-04-04 ENCOUNTER — Other Ambulatory Visit: Payer: Self-pay | Admitting: *Deleted

## 2014-04-04 MED ORDER — OXYCODONE HCL 10 MG PO TABS: 10.0000 mg | ORAL_TABLET | ORAL | Status: DC | PRN

## 2014-04-04 MED ORDER — ZOLPIDEM TARTRATE 10 MG PO TABS: 10.0000 mg | ORAL_TABLET | Freq: Every evening | ORAL | Status: DC | PRN

## 2014-04-04 NOTE — Telephone Encounter (Signed)
[facility] 

## 2014-04-05 NOTE — Op Note (Signed)
Edwin [name], Edwin [name]              ACCOUNT NO.:  [id number]  MEDICAL RECORD NO.:  [id number]  LOCATION:                               FACILITY:  [facility]  PHYSICIAN:  Leta Baptist, MD            DATE OF BIRTH:  30-Nov-1955  DATE OF PROCEDURE:  03/29/2014 DATE OF DISCHARGE:  03/30/2014                              OPERATIVE REPORT   SURGEON:  Leta Baptist, MD  ASSISTANT:  Alferd Patee, PA-C  PREOPERATIVE DIAGNOSIS:  Right parotid mass.  POSTOPERATIVE DIAGNOSIS:  Right parotid mass.  PROCEDURE PERFORMED:  Right lateral parotidectomy.  ANESTHESIA:  General endotracheal tube anesthesia.  COMPLICATIONS:  None.  ESTIMATED BLOOD LOSS:  Minimal.  INDICATION FOR PROCEDURE:  The patient is a 58 year old male with a gradually enlarging right parotid mass.  He previously underwent fine needle aspiration biopsy of the mass.  The result was consistent with a pleomorphic adenoma.  The size of the mass was noted to be approximately 2.8 cm.  Based on the above findings, the decision was made for the patient to undergo the above stated procedure.  The risks, benefits, alternatives, and details of the procedure were discussed with the patient.  Questions were invited and answered.  Informed consent was obtained.  DESCRIPTION:  The patient was taken to the operating room and placed supine on the operating table.  General endotracheal tube anesthesia was administered by the anesthesiologist.  Preop IV antibiotic was given. The patient was positioned and prepped and draped in the standard fashion for the right parotidectomy surgery.  A 1% lidocaine with 1:100,000 epinephrine was injected at the planned site of incision.  The facial nerve electrodes were placed.  The patient's facial nerve monitoring system was functional throughout the case.  A standard right preauricular parotid incision was made.  The incision was curved down to the right lateral neck.  SMAS skin flap was elevated in a standard  fashion.  At this time, a 3-cm firm mass was noted within the tail of the parotid.  Attention was first focused on identifying the facial nerve.  Careful dissection was carried out anterior to the right auricular cartilage.  The dissection was carried down to the level of the facial nerve main trunk.  The main trunk of the facial nerve was then dissected free from the surrounding parotid tissue.  Dissection was then carried out anteriorly along the branches of the facial nerve. Both superior and inferior divisions of the facial nerve were identified and preserved.  The entire mass was dissected free from the facial nerve without difficulty.  The entire mass was then sent to the Pathology Department for permanent histologic identification.  The surgical site was copiously irrigated.  A #10 JP drain was placed.  The incision was closed in layers with 4-0 Vicryl, 5-0 Prolene, and Dermabond.  The care of the patient was turned over to the anesthesiologist.  The patient was awakened from anesthesia without difficulty.  He was extubated and transferred to the recovery room in good condition.  OPERATIVE FINDINGS:  A nearly 3 cm mass was noted within the right tail of the parotid.  The entire mass was removed.  The facial nerve and all branches of the facial nerve were identified and preserved.  SPECIMEN:  Right parotid mass.  FOLLOWUP CARE:  The patient will be observed overnight in the hospital. He will most likely be discharged home once he is awake and alert.     Leta Baptist, MD     ST/MEDQ  D:  03/29/2014  T:  03/30/2014  Job:  [id number]

## 2014-04-06 ENCOUNTER — Ambulatory Visit (INDEPENDENT_AMBULATORY_CARE_PROVIDER_SITE_OTHER): Payer: Medicaid Other | Admitting: Otolaryngology

## 2014-04-13 ENCOUNTER — Other Ambulatory Visit (HOSPITAL_COMMUNITY): Payer: Self-pay | Admitting: Cardiology

## 2014-04-13 DIAGNOSIS — I714 Abdominal aortic aneurysm, without rupture, unspecified: Secondary | ICD-10-CM

## 2014-04-18 ENCOUNTER — Ambulatory Visit (HOSPITAL_COMMUNITY): Payer: Medicaid Other | Attending: Internal Medicine | Admitting: Cardiology

## 2014-04-18 DIAGNOSIS — F17200 Nicotine dependence, unspecified, uncomplicated: Secondary | ICD-10-CM | POA: Diagnosis not present

## 2014-04-18 DIAGNOSIS — J449 Chronic obstructive pulmonary disease, unspecified: Secondary | ICD-10-CM | POA: Diagnosis not present

## 2014-04-18 DIAGNOSIS — E785 Hyperlipidemia, unspecified: Secondary | ICD-10-CM | POA: Insufficient documentation

## 2014-04-18 DIAGNOSIS — I714 Abdominal aortic aneurysm, without rupture, unspecified: Secondary | ICD-10-CM | POA: Diagnosis present

## 2014-04-18 DIAGNOSIS — I739 Peripheral vascular disease, unspecified: Secondary | ICD-10-CM | POA: Insufficient documentation

## 2014-04-18 DIAGNOSIS — I723 Aneurysm of iliac artery: Secondary | ICD-10-CM | POA: Diagnosis not present

## 2014-04-18 DIAGNOSIS — I2510 Atherosclerotic heart disease of native coronary artery without angina pectoris: Secondary | ICD-10-CM | POA: Insufficient documentation

## 2014-04-18 DIAGNOSIS — I700 Atherosclerosis of aorta: Secondary | ICD-10-CM | POA: Diagnosis not present

## 2014-04-18 DIAGNOSIS — I10 Essential (primary) hypertension: Secondary | ICD-10-CM | POA: Diagnosis not present

## 2014-04-18 DIAGNOSIS — J4489 Other specified chronic obstructive pulmonary disease: Secondary | ICD-10-CM | POA: Insufficient documentation

## 2014-04-18 NOTE — Progress Notes (Signed)
Abdominal aorta duplex performed  

## 2014-04-30 ENCOUNTER — Non-Acute Institutional Stay (SKILLED_NURSING_FACILITY): Payer: Medicaid Other | Admitting: Internal Medicine

## 2014-04-30 ENCOUNTER — Non-Acute Institutional Stay: Payer: Medicaid Other | Admitting: Internal Medicine

## 2014-04-30 DIAGNOSIS — I10 Essential (primary) hypertension: Secondary | ICD-10-CM

## 2014-04-30 DIAGNOSIS — I5030 Unspecified diastolic (congestive) heart failure: Secondary | ICD-10-CM | POA: Diagnosis not present

## 2014-04-30 DIAGNOSIS — I739 Peripheral vascular disease, unspecified: Secondary | ICD-10-CM

## 2014-04-30 DIAGNOSIS — J42 Unspecified chronic bronchitis: Secondary | ICD-10-CM

## 2014-04-30 DIAGNOSIS — M25519 Pain in unspecified shoulder: Secondary | ICD-10-CM

## 2014-04-30 DIAGNOSIS — Z9889 Other specified postprocedural states: Secondary | ICD-10-CM

## 2014-04-30 DIAGNOSIS — K254 Chronic or unspecified gastric ulcer with hemorrhage: Secondary | ICD-10-CM

## 2014-04-30 NOTE — Progress Notes (Signed)
Patient ID: Edwin [name], male   DOB: 05/18/1956, 58 y.o.   MRN: [id number]   This is a routine visit.  Level of care skilled.  Facility [facility].   Chief complaint medical management of chronic issues including peripheral vascular disease status post right below the knee amputation history of CHF diastolic-COPD-gout--GI bleed   .  History of present illness .  Patient is a very pleasant 58 year old male with the above diagnoses he has been actually quite stable -he came here after undergoing  a right below the knee amputation secondary to infected right foot-this appears to have healed fairly unremarkable -- stay here was complicated with some history of GI bleeding this was thought to be possibly nonsteroid induced-and his hemoglobin has stabilized he did have an endoscopy done which apparently did not show any acute pathology --he continues on Protonix.  Also has had some history of edema left lower extremity-he does have a history of diastolic CHF but this has been stable his edema appears improved he is on low-dose Lasix.  Patient recently had a partialparathyroidectomy.--he tolerated this well and was thought to be a Pleomorphic  adenoma with clear margins appreciated after the procedure-followup is scheduled in 6 months   Patient has gained a significant amount of weight since his arrival here-BNP was ordered last month which came back fairly unremarkable at 634-again he is on low-dose Lasix his status appears to be stable he eats very well-and this is thought to be largely dietary related.    r.  Vital signs continued to be stable he does have a history COPD but this has been a non-acute issue here for some time --o ambulate about the facility in a wheelchair and appears to be doing quite well.  He does complain of shoulder pain at times x-rays have showed degenerative changes  As she does receive oxycodone 10 mg every 4 hours when necessary-he says this helps but does not completely  eliminate the pain-he apparently is tolerating the oxycodone well    Family medical social history as been reviewed including admission note on 09/13/2013 .  Medications have been reviewed per MAR .  Review of systems  In general no complaints of fever or chills.  Skin does not complaining a rash or itching .  Head ears eyes nose mouth and throat-does not complain of visual changes.  Respiratory no complaints shortness breath or cough.  Cardiac no chest pain -- some baseline left foot edema  GI does not complaining of any abdominal pain nausea or vomiting.  Muscle skeletal -has chronic shoulder pain right greater than left-as noted above.  Neurologic does not complaining of dizziness headache or syncopal type episodes.  Psych no complaint of depression no anxiety apparently depression had been an issue in the past .  Physical exam  temperature 96.7 pulse 74 R 18 blood pressure 126/80 O2 saturations in the 90s on room air weight is 181.8.  In general this is a pleasant middle-age male in no distress sitting comfortably in his wheelchair .  His skin is warm and dry     Chest is clear to auscultation there is no labored breathing .  Heart is regular rate and rhythm without murmur gallop or rub he has some mild edema of his left foot but this appears to be fairly baseline .  Abdomen is soft nontender with positive bowel sounds.  Muscle skeletal does have right below the knee amputation-He has some limited range of motion of his right shoulder  compared to left-I moves his extremities at baseline I do not note any deformities.  Neurologic is grossly intact his speech is clear.  Psych he is alert and oriented pleasant and appropriate.   Labs  03/29/2014.  Sodium 139 potassium 4.5 BUN 27 creatinine 1.23.  WBC 5.7 hemoglobin 13.0 platelets 202.  03/13/2014.  UKG-254  -YHC-6.237.  Marland Kitchen  01/09/2014.  The WBC 5.0 hemoglobin 9.3 platelets 243.  Sodium 139 potassium 4.4 BUN 40  creatinine 1.3.  11/30/2013.  BNP 903.8  Uric acid-6.6  March l 2015.  Liver function tests showed an albumin of 1.6-   Assessment and plan .  #1- Status post partial parathyroidectomy-appears to be doing well with this followup is scheduled.-  #2- History of shoulder pain-again this appears to be arthritic will increase his oxycodone to  15 mg every 4 hours when necessary-and monitor   #3-history of GI bleed-he is on Protonix hemoglobin has shown improvement at this point will continue to monitor endoscopy did not appear to show a serious etiology.   #4-history peripheral vascular disease again there has been hesitancy for anticoagulation secondary to GI bleed--l this appears stable he does not complaining of pain he does receive oxycodone as needed for pain .  #5-hypertension this appears stable he is on low-dose lisinopril.--his blood pressure is 126/80-121/76-the highest I see is 149/94 but this appears to be fairly unusual   #6-EGBTDVV of diastolic CHF this is been stable he is on Lasix low dose   #7-history of gout? Pseudogout-he has responded to colchicine he is on this empirically in this at this point appears to be effective   #8-question mild renal insufficiency-recent metabolic panel shows stability .  #9-COPD-this is stable he is on when necessary nebulizers but apparently does not really need to use much at all.  #10-I do note per vascular appointment  itt was suggested about  possibly consider starting a statin-would like to update his metabolic panel and liver function tests first as well as a lipid panel   [phone]

## 2014-05-04 ENCOUNTER — Encounter: Payer: Self-pay | Admitting: Internal Medicine

## 2014-05-04 DIAGNOSIS — M25519 Pain in unspecified shoulder: Secondary | ICD-10-CM | POA: Insufficient documentation

## 2014-05-09 ENCOUNTER — Other Ambulatory Visit: Payer: Self-pay | Admitting: *Deleted

## 2014-05-09 MED ORDER — OXYCODONE HCL 15 MG PO TABS: ORAL_TABLET | ORAL | Status: DC

## 2014-05-09 NOTE — Telephone Encounter (Signed)
[facility] 

## 2014-05-27 IMAGING — CR DG CHEST 1V PORT
1 series · 1 of 1 positions shown · non-contrast
Comparison: Portable exam 2080 hr compared to 09/13/2013 at 3665 hr

CLINICAL DATA: Post extubation

EXAM:
PORTABLE CHEST - 1 VIEW

[AP]
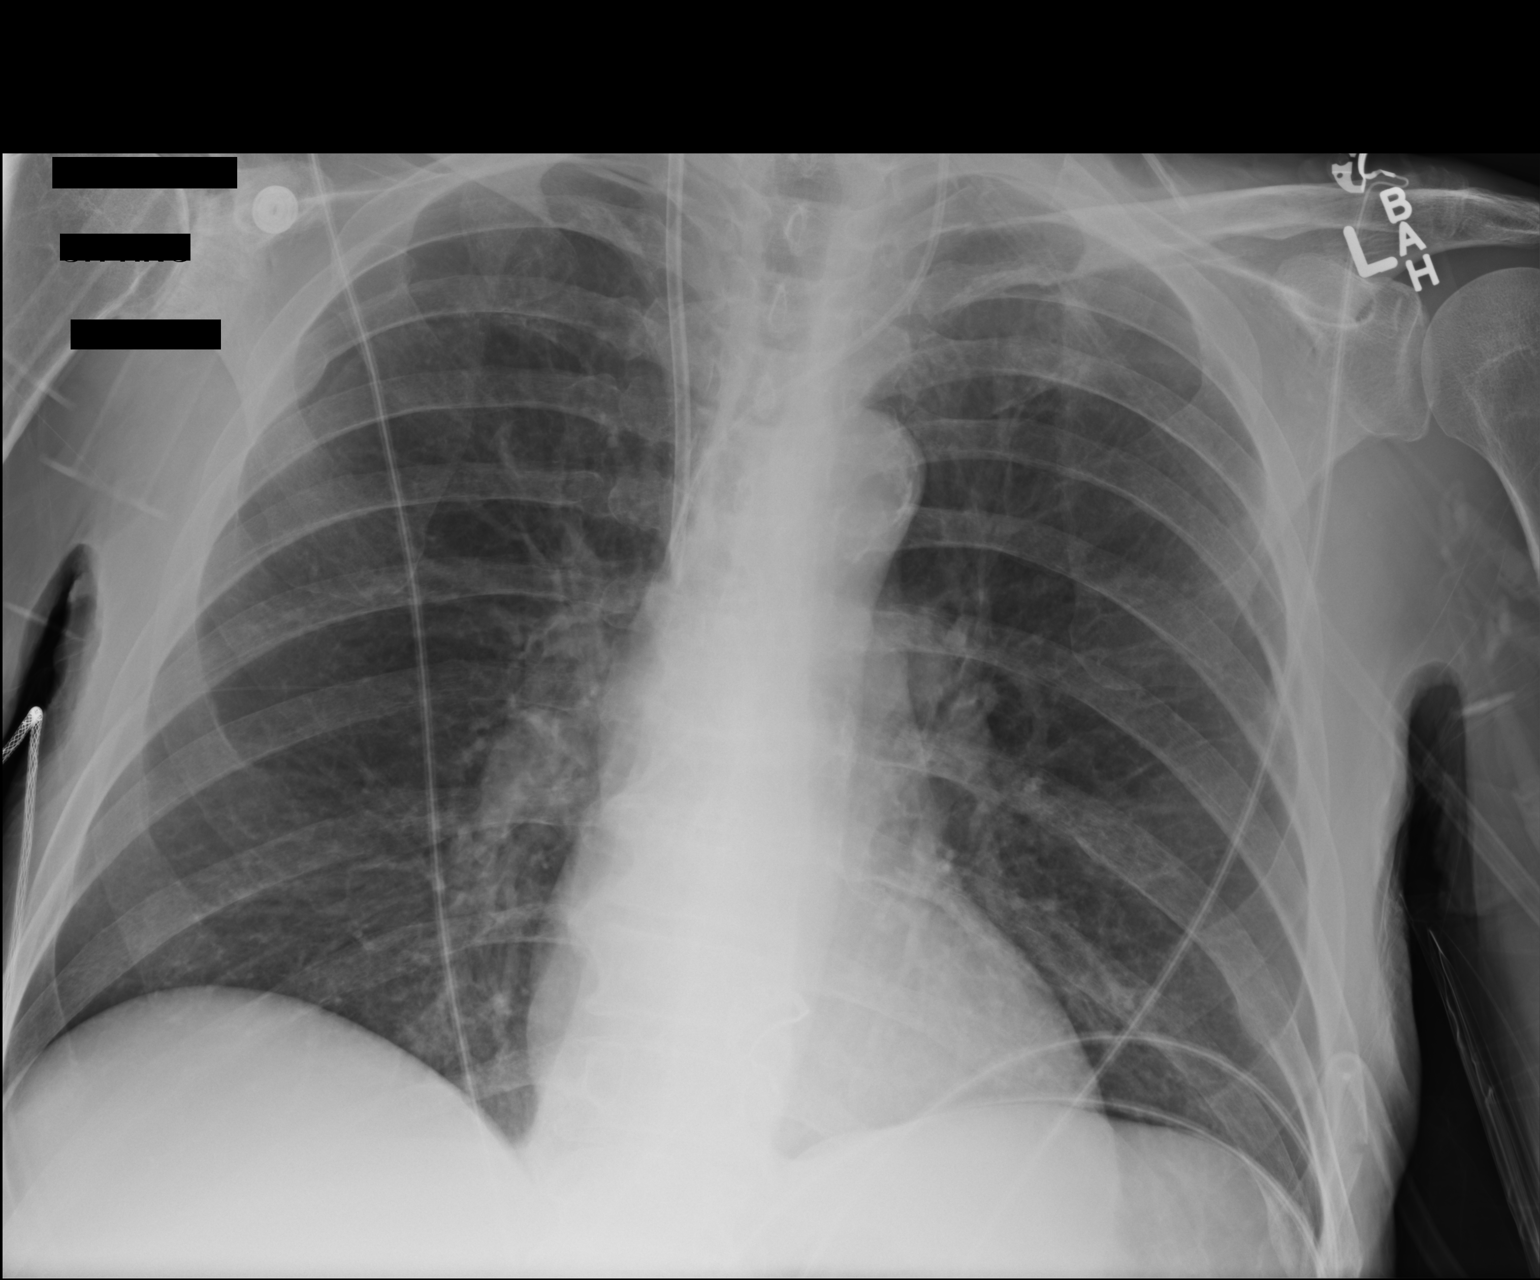

[1 of 1 positions shown; findings below may reference images not displayed]

FINDINGS: Endotracheal and nasogastric tubes no longer identified.

Bilateral jugular lines unchanged.

Normal heart size, mediastinal contours and pulmonary vascularity.

Atherosclerotic calcification aorta.

Bronchitic changes without infiltrate, pleural effusion or
pneumothorax.

Probable skin fold projects over right upper lobe laterally.
IMPRESSION: Bronchitic changes without acute infiltrate.

## 2014-06-16 ENCOUNTER — Other Ambulatory Visit: Payer: Self-pay | Admitting: *Deleted

## 2014-06-16 MED ORDER — OXYCODONE HCL 15 MG PO TABS: ORAL_TABLET | ORAL | Status: DC

## 2014-07-06 ENCOUNTER — Encounter (HOSPITAL_COMMUNITY): Payer: Self-pay | Admitting: Cardiovascular Disease

## 2014-07-06 ENCOUNTER — Non-Acute Institutional Stay (SKILLED_NURSING_FACILITY): Payer: Medicaid Other | Admitting: Internal Medicine

## 2014-07-06 DIAGNOSIS — J42 Unspecified chronic bronchitis: Secondary | ICD-10-CM

## 2014-07-06 DIAGNOSIS — S91109A Unspecified open wound of unspecified toe(s) without damage to nail, initial encounter: Secondary | ICD-10-CM

## 2014-07-06 DIAGNOSIS — I10 Essential (primary) hypertension: Secondary | ICD-10-CM

## 2014-07-06 DIAGNOSIS — I5032 Chronic diastolic (congestive) heart failure: Secondary | ICD-10-CM

## 2014-07-06 DIAGNOSIS — I739 Peripheral vascular disease, unspecified: Secondary | ICD-10-CM

## 2014-07-06 NOTE — Progress Notes (Signed)
Patient ID: Edwin [name], male   DOB: Apr 20, 1956, 58 y.o.   MRN: [id number]  Patient ID: Edwin [name], male   DOB: 20-Apr-1956, 58 y.o.   MRN: [id number]   This is a routine visit.  Level of care skilled.  Facility [facility].   Chief complaint medical management of chronic issues including peripheral vascular disease status post right below the knee amputation history of CHF diastolic-COPD-gout--GI bleed- Acute visit secondary to hypertension-left   greattoe wound   .  History of present illness .  Patient is a very pleasant 58 year old male with the above diagnoses he has been actually quite stable -he came here after undergoing  a right below the knee amputation secondary to infected right foot-this appears to have healed fairly unremarkable -- stay here was complicated with some history of GI bleeding this was thought to be possibly nonsteroid induced-and his hemoglobin has stabilized he did have an endoscopy done which apparently did not show any acute pathology --he continues on Protonix.  Also has had some history of edema left lower extremity-he does have a history of diastolic CHF but this has been stable-- he is on low-dose Lasix.  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  He does complain of shoulder pain at times x-rays have showed degenerative changes  As she does receive oxycodone apparently with relief.  Patient does have a very small wound on the top of his left great toe-nursing  noticed this apparently recently-patient states this is somewhat chronic and occasionally opens up a small bit and then closes up-.  I also noted during routine exam when I took his blood pressure was elevated at 180/108 again he is on low-dose lisinopril -- see listed blood pressures ranging from 124/72-168/62     Family medical social history as been reviewed including admission note on 09/13/2013 .  Medications have been reviewed per MAR .  Review of systems  In general no complaints of fever or chills.  Skin does not complaining a rash or itching .-- a small left great toe wound as noted above  Head ears eyes nose mouth and throat-does not complain of visual changes.  Respiratory no complaints shortness breath or cough.  Cardiac no chest pain -- some baseline left foot edema  GI does not complaining of any abdominal pain nausea or vomiting.  Muscle skeletal -has chronic shoulder pain right greater than left-as noted above.  Neurologic does not complaining of dizziness headache or syncopal type episodes.  Psych no complaint of depression no anxiety apparently depression had been an issue in the past .  Physical exam   Temperature 98.1 pulse 76 respirations 20 blood pressure taken manually 180/108-weight is 193.4 this is a gain of over 10 pounds over the past couple months   In general this is a pleasant middle-age male in no distress sitting comfortably in his wheelchair .  His skin is warm and dry --on Top of the left great toe there is a  very small open area with a small erythematous wound bed there is no drainage or bleeding there is some slight tenderness to palpation of the area there is no surrounding erythema    Chest --some diffuse bronchial sounds-there is no labored breathing  .  Heart is regular rate and rhythm with occasional irregular beats without murmur gallop or rub  from what I could appreciate although he does have distant heart sounds-- he has some mild edema of  his left foot but this appears to be fairly baseline .  Abdomen is soft nontender with positive bowel sounds.   Muscle skeletal does have right below the knee amputation-He has some limited range of motion of his right shoulder compared to left-I moves his extremities at baseline I do not note any deformities.  Neurologic is grossly intact his speech is clear.  Psych he is alert and oriented pleasant and appropriate.   Labs  05/01/2014.  Cholesterol 146-triglycerides 208-HDL 29-LDL 95.  Sodium 141 potassium 4.5 BUN 25 creatinine 1.26.  Liver function tests within normal limits except albumin of 3.3.  03/13/2014-BNP 634.  TSH 3.843.    03/29/2014.  Sodium 139 potassium 4.5 BUN 27 creatinine 1.23.  WBC 5.7 hemoglobin 13.0 platelets 202.  03/13/2014.  HER-740  -TSH-3.843  01/20/2014.  WBC 4.9 hemoglobin 9.7 platelets 236.  Marland Kitchen  01/09/2014.  The WBC 5.0 hemoglobin 9.3 platelets 243.  Sodium 139 potassium 4.4 BUN 40 creatinine 1.3.  11/30/2013.  BNP 903.8  Uric acid-6.6  March l 2015.  Liver function tests showed an albumin of 1.6-   Assessment and plan .  #1- Status post partial parathyroidectomy-appears to be doing well with this followup is scheduled.-  #2- History of shoulder pain-again this appears to be arthritic oxycodone appears to help   #3-history of GI bleed-he is on Protonix hemoglobin has shown improvement at this point will continue to monitor endoscopy did not appear to show a serious etiology--will update CBC.   #4-history peripheral vascular disease again there has been hesitancy for anticoagulation secondary to GI bleed--l this appears stable he does not complaining of pain he does receive oxycodone as needed for pain .  #5-hypertension--blood pressure is elevated today-will give him clonidine 0.1 mg and keep a close eye on his blood pressure hourly 2 and then every 4 hours 2 and then every shift-notify provider of systolic greater than 814 or  diastolic greater than 48-JEHU will increase his lisinopril to 5 mg a day-this will have to be monitored closely    #3-JSHFWYO of diastolic CHF --he has gained weight although edema appears to be relatively baseline-he does have a very good appetite-will order weights tomorrow and 2 times a week notify provider of gain greater than 3 pounds so we can keep a close eye on this   #7-history of gout? Pseudogout-he has responded to colchicine he is on this empirically in this at this point appears to be effective   #8-question mild renal insufficiency-recent metabolic panel shows stability this will warrant update.  #9-left total wound-again this appears to be quite small but with his history of peripheral vascular disease Will have to be taken quite seriously-will start him on Bactrim twice a day for 7 days and monitor closely also culture any drainage.  This was discussed with Dr. Dellia Nims via phone.  Of note patient's blood pressure has come down during the day to 156/92-again will have to be monitored closely we may have to titrate his blood pressure medicine or increase his  Lasix but would like to get more readings here as well as finding out what his weights are doing the next few days  Clinically per serial exam patient remained stable with no complaints of dizziness headache or chest pain  [id number]-of note greater than 40 minutes spent assessing patient-and reassessing patient-as well as coordinating and formulating a plan of care for numerous diagnoses-of note greater than 50% of time spent coordinating plan of care .  #9-COPD-this is stable he is on when necessary nebulizers but apparently does not really need to use much .  #10-I do note per vascular appointment  itt was suggested about  possibly consider starting a statin-would like to update his metabolic panel and liver function tests first as well as a lipid panel   [phone]

## 2014-07-09 ENCOUNTER — Encounter: Payer: Self-pay | Admitting: Internal Medicine

## 2014-07-09 DIAGNOSIS — S91109A Unspecified open wound of unspecified toe(s) without damage to nail, initial encounter: Secondary | ICD-10-CM | POA: Insufficient documentation

## 2014-07-11 ENCOUNTER — Non-Acute Institutional Stay (SKILLED_NURSING_FACILITY): Payer: Medicaid Other | Admitting: Internal Medicine

## 2014-07-11 DIAGNOSIS — N289 Disorder of kidney and ureter, unspecified: Secondary | ICD-10-CM

## 2014-07-11 DIAGNOSIS — I5032 Chronic diastolic (congestive) heart failure: Secondary | ICD-10-CM

## 2014-07-11 DIAGNOSIS — I10 Essential (primary) hypertension: Secondary | ICD-10-CM | POA: Diagnosis not present

## 2014-07-11 NOTE — Progress Notes (Signed)
Patient ID: Edwin [name], male   DOB: 07-29-1955, 58 y.o.   MRN: [id number]   This is an acute visit.  Level care skilled.  Facility [facility].  Chief complaint-acute visit follow-up hypertension-.  History of present illness.  Patient is a pleasant 58 year old male with a history of peripheral vascular disease status post below the knee amputation on the right-history of diastolic CHF as well as COPD and hypertension.  Patient blood pressures in the past appear to be relatively well controlled however recently was noted to have some elevated readings and low dose lisinopril was started.  On last routine visit was noted to have a significantly elevated blood pressure --with systolic around 962.  His lisinopril was increased to 5 mg a day-labs were also ordered which showed a BNP of 1261--this was up from a BMP in the 600 range several months ago.  He was on Lasix 20 mg a day this was increased to 40 mg a day.  Also in order to monitor his weights   Today he has no complaints-I did review his blood pressures recently and appeared to be quite variable I see ranging from systolic in the 952W to 413-[id number] in the 36U to 44I.  I did take his blood pressure manually this evening and got initially 160/98-this was after he had been outside propelling himself in a wheelchair-I did take it proximally 50 minutes later and it was down to 134/96.  He does not complaining any headache dizziness chest pain-has somewhat chronic lower extremity edema appears relatively baseline today.  Family medical social history is been reviewed including admission note on 09/13/2013.  Medications have been reviewed per MAR.  Review of systems.  In general no complaints of fever or chills.  Head ears eyes nose mouth and throat-no visual changes no headache.  Respiratory does not complain of any increased shortness of breath or cough.  Cardiac does not complain of any chest pain has some lower  extremity edema on the left.  Musculoskeletal does complain at times of shoulder pain but this appears stable on current medications.  GI does not complain of any nausea vomiting diarrhea constipation has a very good appetite.  Neurologic no complaints of dizziness syncopal-type episodes or headache.  Psych continues to be in good spirits does not complain of depression or anxiety this has been an issue in the past.  Physical exam.  Temperature is 98.0 68 respirations 20 blood pressure most recently 134/96 taken manually weight last noted 193 this has been a significant weight gain over the past several months but I do not see an updated weight for this week and this has been a gradual weight gain.  In general this is a well-nourished male in no distress sitting comfortably in his wheelchair.  His skin is warm and dry.  He does have a left foot wound is quite minimal this has been monitored by wound care he is on a course of Bactrim-currently it is covered.  Chest he does have some scattered rhonchi on expiration there is no labored breathing.  Heart is regular rate and rhythm without murmur gallop or rub.  He does continue with baseline edema of his left lower extremity.  Abdomen soft nontender with positive bowel sounds.  Muscle skeletal has a right below the knee amputation-ambulates without difficulty has good strength upper extremities bilaterally.  Neurologic is grossly intact his speech is clear no lateralizing findings.  Psych he continues to be alert and oriented pleasant and appropriate.  Labs.  07/10/2014.  Sodium 141 potassium 4.5 BUN 23 creatinine 1.44.  Assessment and plan.  #1-hypertension-has somewhat variable readings here I suspect the higher readings may be somewhat activity related-this was discussed with Dr. Dellia Nims via phone-at this point will continue with lisinopril at current dose with increased Lasix-continue to monitor this-I suspect at some point  he may need titration up but would like to give the changes a bit more time to work.  #6-CBJSEGB of diastolic CHF BNP is elevated some-his Lasix has been increased clinically appears stable would like to obtain a chest x-ray secondary to some chest congestion although I suspect this could be more of a chronic situation-patient continues to smoke despite being advised against it  -Will write order to make sure weights are done expediently so we can follow this more closely  #3-history of renal sufficiency--this appears to be somewhat chronic will have to keep an eye on it however with increased diuretic-as well as on lisinopril-will update this later this week.  [id number]

## 2014-07-12 ENCOUNTER — Ambulatory Visit (HOSPITAL_COMMUNITY): Payer: Medicaid Other | Attending: Internal Medicine

## 2014-07-12 DIAGNOSIS — R0989 Other specified symptoms and signs involving the circulatory and respiratory systems: Secondary | ICD-10-CM | POA: Diagnosis not present

## 2014-07-18 ENCOUNTER — Non-Acute Institutional Stay (SKILLED_NURSING_FACILITY): Payer: Medicaid Other | Admitting: Internal Medicine

## 2014-07-18 DIAGNOSIS — I10 Essential (primary) hypertension: Secondary | ICD-10-CM | POA: Diagnosis not present

## 2014-07-18 DIAGNOSIS — R635 Abnormal weight gain: Secondary | ICD-10-CM

## 2014-07-18 DIAGNOSIS — R0989 Other specified symptoms and signs involving the circulatory and respiratory systems: Secondary | ICD-10-CM

## 2014-07-18 NOTE — Progress Notes (Signed)
Patient ID: Edwin [name], male   DOB: 1956-05-08, 58 y.o.   MRN: [id number]   This is an acute visit.  Level care skilled.  Facility [facility].  Chief complaint-acute visit follow-up hypertension-.  History of present illness.  Patient is a pleasant 58 year old male with a history of peripheral vascular disease status post below the knee amputation on the right-history of diastolic CHF as well as COPD and hypertension.  Patient blood pressures in the past appear to be relatively well controlled however recently was noted to have some elevated readings and low dose lisinopril was started.  On last routine visit was noted to have a significantly elevated blood pressure --with systolic around 673.  His lisinopril was increased to 5 mg a day-labs were also ordered which showed a BNP of 1261--this was up from a BMP in the 600 range several months ago.  He was on Lasix 20 mg a day this was increased to 40 mg a day.  His weight appears to have moderated in fact down about 4 pounds and appears over the past week or so  Today he has no complaints-I did review his blood pressures --I continue to see some systolics in the 419-379 range I also see a 127/80 139/85-apparently his blood pressure comes down somewhat after his medication in the morning.  I did take his blood pressure manually this evening and it was 150/94 .  He does not complaining any headache dizziness chest pain-has somewhat chronic lower extremity edema appears relatively baseline today.  I did note some chest congestion when I saw him last week-chest x-ray however did not show any active process he does not complain of any shortness breath or increased cough from baseline-I do note he does have a listed history of COPD--apparently did have when necessary nebulizers at one time but did not use these  Family medical social history is been reviewed including admission note on 09/13/2013.  Medications have been reviewed per  MAR.  Review of systems.  In general no complaints of fever or chills.  Head ears eyes nose mouth and throat-no visual changes no headache.  Respiratory does not complain of any increased shortness of breath or cough.  Cardiac does not complain of any chest pain has some lower extremity edema on the left.  Musculoskeletal does complain at times of shoulder pain but this appears stable on current medications.  GI does not complain of any nausea vomiting diarrhea constipation has a very good appetite.  Neurologic no complaints of dizziness syncopal-type episodes or headache.  Psych continues to be in good spirits does not complain of depression or anxiety this has been an issue in the past.  Physical exam.  He is afebrile pulse 84 respirations 18 blood pressure taken manually 150/94--is 189.4 this appears to be a loss of about 4 pounds over the past week or so   In general this is a well-nourished male in no distress sitting comfortably in his wheelchair.  His skin is warm and dry.  .  Chest he does have some scattered rhonchi on expiration there is no labored breathing--this is relatively baseline with previous exam.  Heart is regular rate and rhythm without murmur gallop or rub.  He does continue with baseline --possibly  slightly reduced edema of his left lower extremity.  Abdomen soft nontender with positive bowel sounds.  Muscle skeletal has a right below the knee amputation-ambulates without difficulty has good strength upper extremities bilaterally.  Neurologic is grossly intact his  speech is clear no lateralizing findings.  Psych he continues to be alert and oriented pleasant and appropriate.  Labs.    07/10/2014.  Sodium 141 potassium 4.5 BUN 23 creatinine 1.44  07/07/2014.  Sodium 139 potassium 4 BUN 24 creatinine 1.39.  Bilirubin 0.2 albumin 3.1 otherwise liver function tests within normal limits WBC 6.5 hemoglobin 15.8 platelets 148.  Assessment  and plan.  #1-hypertension-has somewhat variable readings here I suspect the higher readings may be somewhat activity related-this was discussed with Dr. Dellia Nims via phone-will increase his lisinopril to 10 mg a day and monitor-he has had recent dose adjustment  about 10 days ago and this appears to be having some beneficial effect--I did not see any evidence of hypotension .  #1-LXBWIOM of diastolic CHF BNP was elevated some-his Lasix has been increased clinically appears stable --weights appear to be trending down-   #3-history of renal sufficiency--this appears to be somewhat chronic will have to keep an eye on it however with increased diuretic-as well as on lisinopril-will update this  #4-question chest congestion-x-ray done last week did not show any active disease at this point will monitor--patient does have a history of COPD-will restart when necessary nebulizers although patient may not be using this much apparently-  [id number]

## 2014-07-19 ENCOUNTER — Other Ambulatory Visit: Payer: Self-pay | Admitting: *Deleted

## 2014-07-19 MED ORDER — OXYCODONE HCL 15 MG PO TABS: ORAL_TABLET | ORAL | Status: DC

## 2014-07-19 NOTE — Telephone Encounter (Signed)
[facility] 

## 2014-07-23 ENCOUNTER — Non-Acute Institutional Stay (SKILLED_NURSING_FACILITY): Payer: Medicaid Other | Admitting: Internal Medicine

## 2014-07-23 ENCOUNTER — Encounter: Payer: Self-pay | Admitting: Internal Medicine

## 2014-07-23 DIAGNOSIS — I10 Essential (primary) hypertension: Secondary | ICD-10-CM | POA: Insufficient documentation

## 2014-07-23 DIAGNOSIS — I5032 Chronic diastolic (congestive) heart failure: Secondary | ICD-10-CM | POA: Diagnosis not present

## 2014-07-23 NOTE — Progress Notes (Signed)
Patient ID: Edwin [name], male   DOB: 01/12/1956, 58 y.o.   MRN: [id number]   This is an acute visit.  Level care skilled.  Facility [facility].  Chief complaint-acute visit follow-up hypertension.  History of present illness.  Patient's a pleasant 58 year old male with a history of peripheral vascular disease as well as diastolic CHF and COPD-in addition to hypertension.  Blood pressures at one point were relatively well controlled recently he's had increased readings-low-dose lisinopril was started and we have been titrating this up this was increased to 10 mg a day 5 days ago.  Also called yesterday about an elevated diastolic above 950-DTO did receive a dose of clonidine and he did come down.  Per discussion with nursing staff and review of blood pressures it appears his blood pressures are moderating somewhat I got 140/88 manually this afternoon and this was after patient had been outside exerting himself ambulating in a wheelchair-also he does continue to smoke outside.  Previous blood pressure earlier today was 133/68-again he does have some spikes at times but these do not appear to be has persistent  He does not complain of any chest pain increase shortness of breath headache or dizziness.  Of note he is on Lasix  40 mg a day as well.  It appears his weight has moderated most recently 189.4 it had been up to 193 at one point.  Family medical social history is been reviewed including admission note on the very 17 2015.  Medications have been reviewed per MAR.  Review of systems.  General not complaining of any fever or chills.  Respiratory no shortness of breath or cough does have some history of COPD.  Musculoskeletal is not complaining of joint pain.  Cardiac no chest pain edema appears to be somewhat improved from several weeks ago.  Neurologic no complaints of dizziness or headache.  Physical exam.  Temp is 98.0 pulse 85 respirations 20 blood pressure  133/60 listed-I took it manually after exertion and got 140/88.  In general this is a pleasant middle-age male in no distress sitting comfortably in his wheelchair.  His skin is warm and dry.  Chest has scattered rhonchi on expiration but this is relatively baseline-there is no labored breathing.  Heart is regular rate and rhythm without murmur gallop or rub he has some mild lower extremity edema left lower extremity his right globe the knee amputation.  Musculoskeletal again he is status post right below the knee amputation-ambulates well in a wheelchair strength appears to be intact all 3 extremities.  Labs.  07/07/2014.  WBC 6.5 hemoglobin 15.8 platelets 148.  Sodium 139 potassium 4 BUN 24 creatinine 1.39-CO2 33-liver function tests within normal limits except albumin of 3.1 and bilirubin 0.2.  Assessment and plan.  #1-hypertension-this appears to be moderating although he still has some spikes -- nursing staff has been monitoring this closely--at this point will monitor again recent adjustment was made less than a week ago would like to have disability more time to work-she also is on Lasix.  #2 history of diastolic CHF-his Lasix was recently increased appears his lost some edema and weight-will update a metabolic panel to make sure electrolytes and renal function remained stable--he continues on low-dose potassium as well

## 2014-07-29 ENCOUNTER — Non-Acute Institutional Stay (SKILLED_NURSING_FACILITY): Payer: Medicaid Other | Admitting: Internal Medicine

## 2014-07-29 DIAGNOSIS — I10 Essential (primary) hypertension: Secondary | ICD-10-CM | POA: Diagnosis not present

## 2014-07-29 NOTE — Progress Notes (Signed)
Patient ID: Edwin [name], male   DOB: May 17, 1956, 59 y.o.   MRN: [id number]     :        This is an acute visit.  Level care skilled.  Facility [facility].  Chief complaint-acute visit follow-up hypertension.  History of present illness.  Patient's a pleasant 59 year old male with a history of peripheral vascular disease as well as diastolic CHF and COPD-in addition to hypertension.  Blood pressures at one point were relatively well controlled recently he's had increased readings-low-dose lisinopril was started and we have been titrating this up this was increased to 10 mg a day a little over a week ago.  Appears blood pressures are gradually improving although at times she will have elevated ones-I got 160/90 manually this evening-looking at records it appears systolic at times is as high as 170 but this is not frequent I also see readings 130/80-123/77-it appears   quite a bit of variability but average systolics appears to be more in the 140 150 range average diastolics in the 56Y-56L       He does not complain of any chest pain increase shortness of breath headache or dizziness.  Of note he is on Lasix  40 mg a day as well.  It appears his weight has moderated most recently 191 it had been up to 193 at one point.  Family medical social history is been reviewed including admission note on the very 17 2015.  Medications have been reviewed per MAR.  Review of systems.  General not complaining of any fever or chills.  Respiratory no shortness of breath or cough does have some history of COPD.  Musculoskeletal is not complaining of joint pain.  Cardiac no chest pain edema appears to be somewhat improved from several weeks ago.  Neurologic no complaints of dizziness or headache.  Physical exam.  T Pulse 85 respirations 20 blood pressure as noted in history of present illness.  In general this is a pleasant middle-age male in no distress sitting comfortably  in his wheelchair.  His skin is warm and dry.  Chest has scattered rhonchi on expiration but this is relatively baseline-there is no labored breathing.  Heart is regular rate and rhythm without murmur gallop or rub he has some mild lower extremity edema left lower extremity his right globe the knee amputation.  Musculoskeletal again he is status post right below the knee amputation-ambulates well in a wheelchair strength appears to be intact all 3 extremities.  Labs.  As ever 28th 2015.  Sodium 138 potassium 3.9 BUN 25 creatinine 1.17.    07/07/2014.  WBC 6.5 hemoglobin 15.8 platelets 148.  Sodium 139 potassium 4 BUN 24 creatinine 1.39-CO2 33-liver function tests within normal limits except albumin of 3.1 and bilirubin 0.2.  Assessment and plan.  #1-hypertension-this appears to be moderating although he still has some spikes -- nursing staff has been monitoring this closely-- Will increase lisinopril to 20 mg a day and continue to monitor  #2 history of diastolic CHF-his Lasix was recently increased --also on potassium supplementation-this appears to be relatively stable continue to monitor   [id number] l

## 2014-07-31 ENCOUNTER — Encounter: Payer: Self-pay | Admitting: Internal Medicine

## 2014-08-01 ENCOUNTER — Ambulatory Visit: Payer: Self-pay | Admitting: Cardiovascular Disease

## 2014-08-02 ENCOUNTER — Non-Acute Institutional Stay (SKILLED_NURSING_FACILITY): Payer: Medicaid Other | Admitting: Internal Medicine

## 2014-08-02 ENCOUNTER — Encounter: Payer: Self-pay | Admitting: Internal Medicine

## 2014-08-02 DIAGNOSIS — I10 Essential (primary) hypertension: Secondary | ICD-10-CM | POA: Diagnosis not present

## 2014-08-02 NOTE — Progress Notes (Signed)
Patient ID: TYTAN SANDATE, male   DOB: September 01, 1955, 59 y.o.   MRN: [id number]  2     This is an acute visit.  Level care skilled.  Facility [facility].  Chief complaint-acute visit follow-up hypertension.  History of present illness.  Patient's a pleasant 59 year old male with a history of peripheral vascular disease as well as diastolic CHF and COPD-in addition to hypertension.  Blood pressures at one point were relatively well controlled recently he's had increased readings-low-dose lisinopril was started and we have been titrating this up this was increased to 20  mg a day on January 2.  Blood pressures continue to be quite variable patient continues to smoke and when he returned from outside today the pressure was elevated at 170/102- he did receive a dose of clonidine 0.1 and this came down fairly quickly to 150/77.   appears blood pressure are variable most recently 150/88-137/82 --he does have occasional systolics in the 196Q as well    he does not complain of any dizziness headache or chest pains clinically appears stable.    .  Of note he is on Lasix  40 mg a day as well.  It appears his weight has moderated most recently 192 it had been up to 193 at one point.  Family medical social history is been reviewed including progress note on 05/10/2014.  Medications have been reviewed per MAR.  Review of systems.   General not complaining of any fever or chills.  Respiratory no shortness of breath or cough does have some history of COPD.  Musculoskeletal is not complaining of joint pain.  Cardiac no chest pain edema appears to be somewhat improved from several weeks ago.  Neurologic no complaints of dizziness or headache.  Physical exam.  T  temperature 98.5 pulse 80 respirations 20 blood pressure [place] 170/102 and down to 150/77.  In general this is a pleasant middle-age male in no distress sitting comfortably in his wheelchair.  His skin is warm and  dry.  Chest has scattered rhonchi on expiration but this is relatively baseline-there is no labored breathing.  Heart is regular rate and rhythm without murmur gallop or rub he has some mild lower extremity edema left lower extremity -- has right below the knee amputation  Musculoskeletal again he is status post right below the knee amputation-ambulates well in a wheelchair strength appears to be intact all 3 extremities.  Labs.  Dec 28th 2015.  Sodium 138 potassium 3.9 BUN 25 creatinine 1.17.    07/07/2014.  WBC 6.5 hemoglobin 15.8 platelets 148.  Sodium 139 potassium 4 BUN 24 creatinine 1.39-CO2 33-liver function tests within normal limits except albumin of 3.1 and bilirubin 0.2.  Assessment and plan.  #1-hypertension- this continues to have a variable presentation-I suspect the activity with going outdoors and smoking elevated blood pressure at times as well-since lisinopril was just increased we will monitor this for a few more days- notify provider of systolic greater than 229 or diastolic greater than 98-- this was discussed with Dr. Dellia Nims r   [phone] l

## 2014-08-04 ENCOUNTER — Non-Acute Institutional Stay (SKILLED_NURSING_FACILITY): Payer: Medicaid Other | Admitting: Internal Medicine

## 2014-08-04 ENCOUNTER — Encounter: Payer: Self-pay | Admitting: Internal Medicine

## 2014-08-04 DIAGNOSIS — I5032 Chronic diastolic (congestive) heart failure: Secondary | ICD-10-CM

## 2014-08-04 DIAGNOSIS — I10 Essential (primary) hypertension: Secondary | ICD-10-CM

## 2014-08-04 NOTE — Progress Notes (Signed)
Patient ID: Edwin [name], male   DOB: 22-May-1956, 60 y.o.   MRN: [id number]  :      This is an acute visit.  Level care skilled.  Facility [facility].  Chief complaint-acute visit follow-up hypertension.--CHF  History of present illness.  Patient's a pleasant 58 year old male with a history of peripheral vascular disease as well as diastolic CHF and COPD-in addition to hypertension.  Blood pressures at one point were relatively well controlled recently he's had increased readings-low-dose lisinopril was started and we have been titrating this up this was increased to 20 mg a day earlier this week  Appears blood pressures are gradually improving although at times he will have elevated ones--in fact nursing noted it was 180/112 I confirm this manually today.  He does not complain of any headache dizziness shortness of breath palpitations.  Nursing staff also has noted some weight gain of 195.8 this is up about 3 pounds since earlier this week if the scales are accurate -he appears to have somewhat chronic left lower extremity edema which does not appear to be grossly increased.  He is on Lasix 40 mg a day with potassium supplementation.       .  Family medical social history is been reviewed including admission note on the very 17 2015.  Medications have been reviewed per MAR.  Review of systems.  General not complaining of any fever or chills  Skin does not complain of any rashes or itching.  Eyes does not complain of any visual changes.  Respiratory no shortness of breath or cough does have some history of COPD.  Musculoskeletal is not complaining of joint pain.  Cardiac no chest pain edema appears to be somewhat improved from several weeks ago.  Neurologic no complaints of dizziness or headache Psyche-- No complaints of anxiety or depression.  Physical exam.  T Is afebrile pulse 70 respirations 18 blood pressure 180/112-previous ones I see  142/92-137/82  In general this is a pleasant middle-age male in no distress sitting comfortably in his wheelchair.  His skin is warm and dry.  Chest has scattered rhonchi on expiration but this is relatively baseline-there is no labored breathing.  Heart is regular rate and rhythm without murmur gallop or rub he has some mild lower extremity edema left lower extremity his right globe the knee amputation.  Musculoskeletal again he is status post right below the knee amputation-ambulates well in a wheelchair strength appears to be intact all 3 extremities.  Neurologic is grossly intact grip strength is strong bilaterally no lateralizing findings her speech is clear.  Psych he is alert and oriented 3  Labs.  12- 28th 2015.  Sodium 138 potassium 3.9 BUN 25 creatinine 1.17.    07/07/2014.  WBC 6.5 hemoglobin 15.8 platelets 148.  Sodium 139 potassium 4 BUN 24 creatinine 1.39-CO2 33-liver function tests within normal limits except albumin of 3.1 and bilirubin 0.2.  Assessment and plan.  Hypertension-Lisinoprilr was recently increased-he will need clonidine 0.1 mg 1-and close monitoring of his blood pressure --this appears to be variable--suspect at some point he will need increase in his blood pressure medicines however there was just recent adjustment here and per previous discussions with Dr. Dellia Nims  for now -this appears to be somewhat intermittent   History of weight gain-he does have a history of diagnostic CHF this appears to be fairly stable we will increase his Lasix secondary to the weight gain to 20 mg every afternoon continue 40 mg every morning also  will supplement with potassium 30 mEq a day-recheck a BMP and BNP tomorrow-also check a BMP next week   [id number]  #1-hypertension-this appears to be moderating although he still has some spikes -- nursing staff has been monitoring this closely-- Will increase lisinopril to 20 mg a day and continue to monitor  #2 history  of diastolic CHF-his Lasix was recently increased --also on potassium supplementation-this appears to be relatively stable continue to monitor   [id number] l

## 2014-08-09 ENCOUNTER — Non-Acute Institutional Stay (SKILLED_NURSING_FACILITY): Payer: Medicaid Other | Admitting: Internal Medicine

## 2014-08-09 DIAGNOSIS — I5032 Chronic diastolic (congestive) heart failure: Secondary | ICD-10-CM | POA: Diagnosis not present

## 2014-08-09 DIAGNOSIS — I10 Essential (primary) hypertension: Secondary | ICD-10-CM

## 2014-08-09 NOTE — Progress Notes (Signed)
Patient ID: BENIGNO CHECK, male   DOB: 1955-10-24, 59 y.o.   MRN: [id number]    :      This is an acute visit.  Level care skilled.  Facility [facility].  Chief complaint-acute visit follow-up hypertension.--CHF  History of present illness.  Patient's a pleasant 60 year old male with a history of peripheral vascular disease as well as diastolic CHF and COPD-in addition to hypertension.  Blood pressures at one point were relatively well controlled recently he's had increased readings-low-dose lisinopril was started and we have been titrating this up this was increased to 30 most recently   Blood pressures continue to be variable most recently 160/90 taken manually I see 164/90-141/87-127/80 recently  He does not complain of any headache dizziness shortness of breath palpitations.  Nursing staff also had noted some weight gain of 195.8--- his Lasix has been increased to 40 mg in the morning 20 mg at night-his weight appears to have come down a bit to 192.2 edema appears relatively baseline .    Family medical social history has been reviewed .  Medications have been reviewed per MAR.  Review of systems.  General not complaining of any fever or chills  Skin does not complain of any rashes or itching.  Eyes does not complain of any visual changes.  Respiratory no shortness of breath or cough does have some history of COPD.  Musculoskeletal is not complaining of joint pain.  Cardiac no chest pain edema appears to be somewhat improved from several weeks ago.  Neurologic no complaints of dizziness or headache Psyche-- No complaints of anxiety or depression.  Physical exam. He is afebrile pulse 85 respirations 20 blood pressure 160/90  In general this is a pleasant middle-age male in no distress sitting comfortably in his wheelchair.  His skin is warm and dry.  Chest has scattered rhonchi on expiration but this is relatively baseline-there is no labored  breathing.  Heart is regular rate and rhythm without murmur gallop or rub he has some mild lower extremity edema left lower extremity --has right below the knee amputation.  Musculoskeletal again he is status post right below the knee amputation-ambulates well in a wheelchair strength appears to be intact all 3 extremities.  Neurologic is grossly intact grip strength is strong bilaterally no lateralizing findings her speech is clear.  Psych he is alert and oriented 3  Labs.  08/09/2014.  Sodium 136 potassium 3.9 BUN 33 creatinine 1.41.  08/05/2014.  BNP 218.6.  Sodium 138 potassium 3.7 BUN 24 creatinine 1.10   .    07/07/2014.  WBC 6.5 hemoglobin 15.8 platelets 148.  Sodium 139 potassium 4 BUN 24 creatinine 1.39-CO2 33-liver function tests within normal limits except albumin of 3.1 and bilirubin 0.2.  Assessment and plan.  Hypertension Continues at times to have elevated readings will increase lisinopril to 40 mg a day and monitor this-next option   Possibly would be clonidine patient had been on this before-this was discussed with Dr. Dellia Nims via phone--if blood pressure still elevated next week consider the clonidine this was again discussed with Dr. Dellia Nims via phone.     History of weight gain-he does have a history of diastolic  CHF this appears to be fairly stable  With increasing Lasix-will have to monitor renal function closely-update BMP on Friday-   [id number]  #  l

## 2014-08-13 ENCOUNTER — Encounter: Payer: Self-pay | Admitting: Internal Medicine

## 2014-08-15 ENCOUNTER — Non-Acute Institutional Stay (SKILLED_NURSING_FACILITY): Payer: Medicaid Other | Admitting: Internal Medicine

## 2014-08-15 DIAGNOSIS — S91109D Unspecified open wound of unspecified toe(s) without damage to nail, subsequent encounter: Secondary | ICD-10-CM

## 2014-08-15 DIAGNOSIS — N289 Disorder of kidney and ureter, unspecified: Secondary | ICD-10-CM | POA: Diagnosis not present

## 2014-08-15 DIAGNOSIS — I10 Essential (primary) hypertension: Secondary | ICD-10-CM | POA: Diagnosis not present

## 2014-08-15 NOTE — Progress Notes (Signed)
Patient ID: Edwin [name], male   DOB: [birth year], 59 y.o.   MRN: [id number]   This is an acute visit  Level care skilled.  Facility [facility].  Chief complaint-acute visit follow-up hypertension.--CHF--left foot wound  History of present illness.  Patient's a pleasant 59 year old male with a history of peripheral vascular disease as well as diastolic CHF and COPD-in addition to hypertension.  Blood pressures at one point were relatively well controlled recently he's had increased readings-low-dose lisinopril was started and we have been titrating this up this was increased to 40  mg most recently   Blood pressure appears somewhat improved although there are still some elevations and in fact I got 150/100 today  He does not complain of any headache dizziness shortness of breath palpitations.  Nursing staff also had noted some weight gain of 195.8--- his Lasix has been increased to 40 mg in the morning 20 mg at night-his weight appears to have stabilized hovering it appears between low 190s to 195  Patient also has an intermittent left foot wound this has been treated previously with a short course of doxycycline apparently resolved-apparently this has reoccurred he does have a significant history of peripheral vascular disease .    Family medical social history has been reviewed including including H&P on 09/23/2013 .  Medications have been reviewed per MAR.  Review of systems.  General not complaining of any fever or chills  Skin does not complain of any rashes or itching--left foot wound as noted above apparently this is not significantly painful--with no active drainage.  Eyes does not complain of any visual changes.  Respiratory no shortness of breath or cough does have some history of COPD.  Musculoskeletal is not complaining of joint pain.  Cardiac no chest pain edema appears to be somewhat improved from several weeks ago.  Neurologic no complaints of  dizziness or headache Psyche-- No complaints of anxiety or depression.  Physical exam. He is afebrile pulse of 82 respirations 19 blood pressure taken manually 150/100 I see variable readings ranging from 126/74--systolics in the 825K  In general this is a pleasant middle-age male in no distress sitting comfortably in his wheelchair.  His skin is warm and dry.--The top of the left foot there is a small open area that has ant erythematous bed I do not see active drainage this appears to be somewhat less than 1 cm in diameter  Chest has scattered rhonchi on expiration but this is relatively baseline-there is no labored breathing.  Heart is regular rate and rhythm without murmur gallop or rub he has some mild lower extremity edema left lower extremity --has right below the knee amputation.  Musculoskeletal again he is status post right below the knee amputation-ambulates well in a wheelchair strength appears to be intact all 3 extremities.  Neurologic is grossly intact grip strength is strong bilaterally no lateralizing findings her speech is clear.  Psych he is alert and oriented 3  Labs.  08/09/2014.  Sodium 136 potassium 3.9 BUN 33 creatinine 1.41.  08/05/2014.  BNP 218.6.  Sodium 138 potassium 3.7 BUN 24 creatinine 1.10   .    07/07/2014.  WBC 6.5 hemoglobin 15.8 platelets 148.  Sodium 139 potassium 4 BUN 24 creatinine 1.39-CO2 33-liver function tests within normal limits except albumin of 3.1 and bilirubin 0.2.  Assessment and plan.  Hypertension Continues at times to have elevated readings--titration up of lisinopril appears to help some but still has some significant elevations-will start clonidine 0.1 mg  twice a day starting tomorrow-for now will give a dose of clonidine-this has been quite effective in the past in lowering his blood pressure fairly expediently-continue to monitor blood pressures today-      History of weight gain-he does have a history of  diastolic  CHF this appears to be fairly stable  With increasing Lasix-will have to monitor renal function closely-update BMPtomorrow  History of left foot wound-this appears to have recurred-will restart doxycycline 100 mg twice a day-also will need follow-up with Dr. Dellia Nims later this week -I suspect he will need arterial studies with significant history of peripheral vascular disease   [id number]  #  l

## 2014-08-16 ENCOUNTER — Non-Acute Institutional Stay (SKILLED_NURSING_FACILITY): Payer: Medicaid Other | Admitting: Internal Medicine

## 2014-08-16 DIAGNOSIS — I739 Peripheral vascular disease, unspecified: Secondary | ICD-10-CM | POA: Diagnosis not present

## 2014-08-16 DIAGNOSIS — S91109D Unspecified open wound of unspecified toe(s) without damage to nail, subsequent encounter: Secondary | ICD-10-CM | POA: Diagnosis not present

## 2014-08-17 ENCOUNTER — Ambulatory Visit (HOSPITAL_COMMUNITY)
Admit: 2014-08-17 | Discharge: 2014-08-17 | Disposition: A | Discharge status: Home / Self Care | Payer: Medicaid Other | Source: Skilled Nursing Facility | Attending: Internal Medicine | Admitting: Internal Medicine

## 2014-08-17 DIAGNOSIS — Z720 Tobacco use: Secondary | ICD-10-CM | POA: Diagnosis not present

## 2014-08-17 DIAGNOSIS — L97529 Non-pressure chronic ulcer of other part of left foot with unspecified severity: Secondary | ICD-10-CM | POA: Insufficient documentation

## 2014-08-17 DIAGNOSIS — I10 Essential (primary) hypertension: Secondary | ICD-10-CM | POA: Diagnosis not present

## 2014-08-17 DIAGNOSIS — I771 Stricture of artery: Secondary | ICD-10-CM | POA: Diagnosis not present

## 2014-08-17 DIAGNOSIS — E785 Hyperlipidemia, unspecified: Secondary | ICD-10-CM | POA: Insufficient documentation

## 2014-08-21 NOTE — Progress Notes (Addendum)
Patient ID: Edwin [name], male   DOB: 1956/01/01, 59 y.o.   MRN: [id number]               PROGRESS NOTE  DATE:  08/16/2014                       FACILITY: [facility]       LEVEL OF CARE:   SNF   Acute Visit   CHIEF COMPLAINT:  Review of wound on the left great toe.             HISTORY OF PRESENT ILLNESS:  Edwin [name] is a gentleman who came to this facility last February.  At that point, he had undergone a right AKA due to a progressively worsening wound on his right foot.    He had had a previous noninvasive vascular assessment done in August 2014 which showed an ABI of 0.92 on the left and toe brachial index of 0.6 on the left, as well.  He had posterior tibial artery and dorsalis pedis artery monophasic waveforms.  He also had dampened waveforms in the left great toe.    As I recreate the history here, he came in with a right above-knee amputation.  At that point, he had wounds on his first, third, and fifth toes which healed.  Apparently a month ago, the one on the dorsal aspect of the left great toe reopened, closed nicely with silver alginate.  However, it has reopened again.  He is on doxycycline.  He does not describe pain, nor does he really describe claudication.     PHYSICAL EXAMINATION:      VASCULAR:   ARTERIAL:  Left leg:  I cannot feel any pulses in the left leg, which is somewhat surprising given the reasonably, only mildly abnormal studies from August 2014, although things could have gotten worse in that time frame.   SKIN:  INSPECTION:  The wound is over the left great toe, dorsal aspect.  It has a somewhat sloughy-looking base.  I think this should have Santyl and probably some debridement, and repeat arterial dopplers and ABIs of the left leg.  He has very poor capillary refill time in the left great toe.     ASSESSMENT/PLAN:                        Ischemic wound of the left great toe, as described.  I think his arterial studies probably have worsened  in the interim.  His toe brachial index of 0.6 back in August 2014 is a borderline result.  However, the absence of pulses anywhere in the left leg would have been unpredictable for an ABI of 0.92.                                                         CPT CODE: 25638

## 2014-08-23 ENCOUNTER — Encounter: Payer: Self-pay | Admitting: Vascular Surgery

## 2014-08-23 ENCOUNTER — Non-Acute Institutional Stay (SKILLED_NURSING_FACILITY): Payer: Medicaid Other | Admitting: Internal Medicine

## 2014-08-23 DIAGNOSIS — I739 Peripheral vascular disease, unspecified: Secondary | ICD-10-CM

## 2014-08-23 DIAGNOSIS — S91109D Unspecified open wound of unspecified toe(s) without damage to nail, subsequent encounter: Secondary | ICD-10-CM

## 2014-08-24 ENCOUNTER — Encounter: Payer: Self-pay | Admitting: Vascular Surgery

## 2014-08-24 ENCOUNTER — Ambulatory Visit (INDEPENDENT_AMBULATORY_CARE_PROVIDER_SITE_OTHER): Payer: Medicaid Other | Admitting: Vascular Surgery

## 2014-08-24 VITALS — BP 140/78 | HR 77 | Ht 68.0 in | Wt 180.0 lb

## 2014-08-24 DIAGNOSIS — L98499 Non-pressure chronic ulcer of skin of other sites with unspecified severity: Secondary | ICD-10-CM

## 2014-08-24 DIAGNOSIS — Z01810 Encounter for preprocedural cardiovascular examination: Secondary | ICD-10-CM

## 2014-08-24 DIAGNOSIS — I70209 Unspecified atherosclerosis of native arteries of extremities, unspecified extremity: Secondary | ICD-10-CM

## 2014-08-24 DIAGNOSIS — I739 Peripheral vascular disease, unspecified: Secondary | ICD-10-CM
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
## 2014-08-24 NOTE — Addendum Note (Signed)
Addended by: Elam Dutch on: 08/24/2014 04:43 PM   Modules accepted: Level of Service

## 2014-08-25 ENCOUNTER — Encounter: Payer: Self-pay | Admitting: Cardiovascular Disease

## 2014-08-27 NOTE — Progress Notes (Addendum)
Patient ID: Edwin [name], male   DOB: May 23, 1956, 59 y.o.   MRN: [id number]                PROGRESS NOTE  DATE:  08/23/2014                 FACILITY: [facility]      LEVEL OF CARE:   SNF   Acute Visit                           CHIEF COMPLAINT:  Review of wound on the left great toe.    HISTORY OF PRESENT ILLNESS:  This is a patient who came into the building with wounds on his left first, third, and fifth toes.  They actually healed.  He has a previous right above-knee amputation.  He is not a diabetic, although he has had previous noninvasive vascular studies done in August 2014.    He developed a reopening of a wound on his left first dorsal toe.  The noninvasive studies that I have done in the facility suggested a left ABI of 0.61, moderately severe multilevel left lower extremity arterial disease at rest.  All of this sounds quite a bit worse than in August 2014.    He also had an infection of the left great toe, or at least seemingly so, with erythema.  We gave him a course of doxycycline and things appear to be somewhat better.    PHYSICAL EXAMINATION:   SKIN:    INSPECTION:  Left foot:  The wound is small but with some depth, a base of eschar.   This does not look like an imminently threatening limb situation, fortunately.  However, he does need to see Vein & Vascular to see if any form of revascularization can be done.  He will need an arteriogram, no doubt.  I have talked to him about this.    ASSESSMENT/PLAN:     For now, for the wound on the toe, we are going to continue the Santyl-based dressings.  The doxycycline is completing and I think it has done its work, although I do not think there was ever an organism cultured.  The major issue is PAD. Consult to Vascular surgery

## 2014-08-28 ENCOUNTER — Ambulatory Visit (HOSPITAL_COMMUNITY)
Admit: 2014-08-28 | Discharge: 2014-08-28 | Disposition: A | Discharge status: Home / Self Care | Payer: Medicaid Other | Source: Ambulatory Visit | Attending: Vascular Surgery | Admitting: Vascular Surgery

## 2014-08-28 ENCOUNTER — Other Ambulatory Visit: Payer: Self-pay | Admitting: *Deleted

## 2014-08-28 DIAGNOSIS — I739 Peripheral vascular disease, unspecified: Secondary | ICD-10-CM | POA: Insufficient documentation

## 2014-08-28 DIAGNOSIS — L98499 Non-pressure chronic ulcer of skin of other sites with unspecified severity: Secondary | ICD-10-CM | POA: Insufficient documentation

## 2014-08-28 DIAGNOSIS — I70209 Unspecified atherosclerosis of native arteries of extremities, unspecified extremity: Secondary | ICD-10-CM

## 2014-08-28 DIAGNOSIS — Z01810 Encounter for preprocedural cardiovascular examination: Secondary | ICD-10-CM | POA: Diagnosis present

## 2014-08-28 MED ORDER — OXYCODONE HCL 15 MG PO TABS: ORAL_TABLET | ORAL | Status: DC

## 2014-08-28 NOTE — Telephone Encounter (Signed)
[facility] 

## 2014-08-30 ENCOUNTER — Encounter (HOSPITAL_COMMUNITY)
Admission: RE | Admit: 2014-08-30 | Discharge: 2014-08-30 | Disposition: A | Discharge status: Home / Self Care | Payer: Medicaid Other | Source: Skilled Nursing Facility | Attending: Internal Medicine | Admitting: Internal Medicine

## 2014-08-30 DIAGNOSIS — I509 Heart failure, unspecified: Secondary | ICD-10-CM | POA: Diagnosis not present

## 2014-08-30 DIAGNOSIS — I10 Essential (primary) hypertension: Secondary | ICD-10-CM | POA: Insufficient documentation

## 2014-08-30 LAB — CBC WITH DIFFERENTIAL/PLATELET
BASOS ABS: 0 10*3/uL (ref 0.0–0.1)
Basophils Relative: 1 % (ref 0–1)
EOS PCT: 3 % (ref 0–5)
Eosinophils Absolute: 0.2 10*3/uL (ref 0.0–0.7)
HEMATOCRIT: 52.5 % — AB (ref 39.0–52.0)
HEMOGLOBIN: 16.9 g/dL (ref 13.0–17.0)
LYMPHS ABS: 1.9 10*3/uL (ref 0.7–4.0)
Lymphocytes Relative: 33 % (ref 12–46)
MCH: 28.5 pg (ref 26.0–34.0)
MCHC: 32.2 g/dL (ref 30.0–36.0)
MCV: 88.7 fL (ref 78.0–100.0)
MONOS PCT: 8 % (ref 3–12)
Monocytes Absolute: 0.4 10*3/uL (ref 0.1–1.0)
Neutro Abs: 3.2 10*3/uL (ref 1.7–7.7)
Neutrophils Relative %: 57 % (ref 43–77)
Platelets: 118 10*3/uL — ABNORMAL LOW (ref 150–400)
RBC: 5.92 MIL/uL — ABNORMAL HIGH (ref 4.22–5.81)
RDW: 16.2 % — AB (ref 11.5–15.5)
Smear Review: DECREASED
WBC: 5.7 10*3/uL (ref 4.0–10.5)

## 2014-09-11 ENCOUNTER — Encounter (HOSPITAL_COMMUNITY)
Admission: RE | Admit: 2014-09-11 | Discharge: 2014-09-11 | Disposition: A | Discharge status: Home / Self Care | Payer: Medicaid Other | Source: Skilled Nursing Facility | Attending: Internal Medicine | Admitting: Internal Medicine

## 2014-09-11 DIAGNOSIS — I10 Essential (primary) hypertension: Secondary | ICD-10-CM | POA: Diagnosis not present

## 2014-09-11 LAB — BASIC METABOLIC PANEL
Anion gap: 8 (ref 5–15)
BUN: 25 mg/dL — ABNORMAL HIGH (ref 6–23)
CO2: 37 mmol/L — AB (ref 19–32)
CREATININE: 1.63 mg/dL — AB (ref 0.50–1.35)
Calcium: 8.6 mg/dL (ref 8.4–10.5)
Chloride: 92 mmol/L — ABNORMAL LOW (ref 96–112)
GFR calc Af Amer: 52 mL/min — ABNORMAL LOW (ref >90)
GFR calc non Af Amer: 45 mL/min — ABNORMAL LOW (ref >90)
GLUCOSE: 113 mg/dL — AB (ref 70–99)
Potassium: 3.9 mmol/L (ref 3.5–5.1)
Sodium: 137 mmol/L (ref 135–145)

## 2014-09-12 ENCOUNTER — Encounter: Payer: Self-pay | Admitting: Vascular Surgery

## 2014-09-13 ENCOUNTER — Other Ambulatory Visit: Payer: Self-pay | Admitting: *Deleted

## 2014-09-13 ENCOUNTER — Ambulatory Visit (INDEPENDENT_AMBULATORY_CARE_PROVIDER_SITE_OTHER): Payer: Medicaid Other | Admitting: Vascular Surgery

## 2014-09-13 ENCOUNTER — Inpatient Hospital Stay
Admit: 2014-09-13 | Discharge: 2014-09-13 | Disposition: A | Discharge status: Home / Self Care | Payer: Medicaid Other | Attending: Vascular Surgery | Admitting: Vascular Surgery

## 2014-09-13 ENCOUNTER — Encounter: Payer: Self-pay | Admitting: Vascular Surgery

## 2014-09-13 ENCOUNTER — Encounter: Payer: Self-pay | Admitting: *Deleted

## 2014-09-13 VITALS — BP 164/104 | HR 91 | Resp 14 | Ht 68.0 in | Wt 190.0 lb

## 2014-09-13 DIAGNOSIS — I70209 Unspecified atherosclerosis of native arteries of extremities, unspecified extremity: Secondary | ICD-10-CM

## 2014-09-13 DIAGNOSIS — I739 Peripheral vascular disease, unspecified: Secondary | ICD-10-CM

## 2014-09-13 DIAGNOSIS — L98499 Non-pressure chronic ulcer of skin of other sites with unspecified severity: Secondary | ICD-10-CM

## 2014-09-13 DIAGNOSIS — Z01810 Encounter for preprocedural cardiovascular examination: Secondary | ICD-10-CM

## 2014-09-13 MED ORDER — IOPAMIDOL (ISOVUE-370) INJECTION 76%: 165.0000 mL | Freq: Once | INTRAVENOUS | Status: DC | PRN
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
## 2014-09-21 MED ORDER — SODIUM CHLORIDE 0.9 % IV SOLN
INTRAVENOUS | Status: DC
  Administered 2014-09-22: 09:00:00 via INTRAVENOUS

## 2014-09-22 ENCOUNTER — Ambulatory Visit (HOSPITAL_COMMUNITY)
Admission: RE | Admit: 2014-09-22 | Discharge: 2014-09-23 | Disposition: A | Discharge status: Skilled Nursing Facility | Payer: Medicaid Other | Source: Ambulatory Visit | Attending: Vascular Surgery | Admitting: Vascular Surgery

## 2014-09-22 ENCOUNTER — Other Ambulatory Visit: Payer: Self-pay | Admitting: *Deleted

## 2014-09-22 ENCOUNTER — Encounter (HOSPITAL_COMMUNITY): Payer: Self-pay | Admitting: General Practice

## 2014-09-22 ENCOUNTER — Encounter (HOSPITAL_COMMUNITY)
Admission: RE | Disposition: A | Discharge status: Skilled Nursing Facility | Payer: Self-pay | Source: Ambulatory Visit | Attending: Vascular Surgery

## 2014-09-22 DIAGNOSIS — L97529 Non-pressure chronic ulcer of other part of left foot with unspecified severity: Secondary | ICD-10-CM | POA: Diagnosis present

## 2014-09-22 DIAGNOSIS — F17210 Nicotine dependence, cigarettes, uncomplicated: Secondary | ICD-10-CM | POA: Insufficient documentation

## 2014-09-22 DIAGNOSIS — I2510 Atherosclerotic heart disease of native coronary artery without angina pectoris: Secondary | ICD-10-CM | POA: Insufficient documentation

## 2014-09-22 DIAGNOSIS — I70248 Atherosclerosis of native arteries of left leg with ulceration of other part of lower left leg: Secondary | ICD-10-CM | POA: Diagnosis not present

## 2014-09-22 DIAGNOSIS — J45909 Unspecified asthma, uncomplicated: Secondary | ICD-10-CM | POA: Diagnosis not present

## 2014-09-22 DIAGNOSIS — I10 Essential (primary) hypertension: Secondary | ICD-10-CM | POA: Diagnosis not present

## 2014-09-22 DIAGNOSIS — J449 Chronic obstructive pulmonary disease, unspecified: Secondary | ICD-10-CM | POA: Diagnosis not present

## 2014-09-22 DIAGNOSIS — I509 Heart failure, unspecified: Secondary | ICD-10-CM | POA: Diagnosis not present

## 2014-09-22 DIAGNOSIS — I6523 Occlusion and stenosis of bilateral carotid arteries: Secondary | ICD-10-CM | POA: Insufficient documentation

## 2014-09-22 DIAGNOSIS — I4891 Unspecified atrial fibrillation: Secondary | ICD-10-CM | POA: Diagnosis not present

## 2014-09-22 DIAGNOSIS — E785 Hyperlipidemia, unspecified: Secondary | ICD-10-CM | POA: Diagnosis not present

## 2014-09-22 DIAGNOSIS — Z89611 Acquired absence of right leg above knee: Secondary | ICD-10-CM | POA: Insufficient documentation

## 2014-09-22 DIAGNOSIS — I739 Peripheral vascular disease, unspecified: Secondary | ICD-10-CM

## 2014-09-22 HISTORY — DX: Cardiac murmur, unspecified: R01.1

## 2014-09-22 HISTORY — PX: ABDOMINAL AORTAGRAM: SHX5454

## 2014-09-22 HISTORY — DX: Pneumonia, unspecified organism: J18.9

## 2014-09-22 HISTORY — DX: Unspecified osteoarthritis, unspecified site: M19.90

## 2014-09-22 HISTORY — PX: ILIAC ARTERY STENT: SHX1786

## 2014-09-22 LAB — POCT I-STAT, CHEM 8
BUN: 30 mg/dL — ABNORMAL HIGH (ref 6–23)
CALCIUM ION: 1.12 mmol/L (ref 1.12–1.23)
Chloride: 93 mmol/L — ABNORMAL LOW (ref 96–112)
Creatinine, Ser: 1.4 mg/dL — ABNORMAL HIGH (ref 0.50–1.35)
GLUCOSE: 105 mg/dL — AB (ref 70–99)
HEMATOCRIT: 56 % — AB (ref 39.0–52.0)
Hemoglobin: 19 g/dL — ABNORMAL HIGH (ref 13.0–17.0)
Potassium: 4 mmol/L (ref 3.5–5.1)
Sodium: 137 mmol/L (ref 135–145)
TCO2: 29 mmol/L (ref 0–100)

## 2014-09-22 LAB — POCT ACTIVATED CLOTTING TIME
ACTIVATED CLOTTING TIME: 214 s
Activated Clotting Time: 288 seconds
Activated Clotting Time: 171 seconds

## 2014-09-22 SURGERY — ABDOMINAL AORTAGRAM

## 2014-09-22 MED ORDER — LABETALOL HCL 5 MG/ML IV SOLN
INTRAVENOUS | Status: AC
  Filled 2014-09-22: qty 4

## 2014-09-22 MED ORDER — METOPROLOL TARTRATE 1 MG/ML IV SOLN: 2.0000 mg | INTRAVENOUS | Status: DC | PRN

## 2014-09-22 MED ORDER — CLOPIDOGREL BISULFATE 75 MG PO TABS
75.0000 mg | ORAL_TABLET | Freq: Every day | ORAL | Status: DC
  Filled 2014-09-22: qty 1

## 2014-09-22 MED ORDER — OXYCODONE HCL 5 MG PO TABS: 15.0000 mg | ORAL_TABLET | ORAL | Status: DC | PRN

## 2014-09-22 MED ORDER — HYDRALAZINE HCL 20 MG/ML IJ SOLN
INTRAMUSCULAR | Status: AC
  Filled 2014-09-22: qty 1

## 2014-09-22 MED ORDER — HEPARIN SODIUM (PORCINE) 1000 UNIT/ML IJ SOLN
INTRAMUSCULAR | Status: AC
  Filled 2014-09-22: qty 1

## 2014-09-22 MED ORDER — FENTANYL CITRATE 0.05 MG/ML IJ SOLN
INTRAMUSCULAR | Status: AC
  Filled 2014-09-22: qty 5

## 2014-09-22 MED ORDER — ONDANSETRON HCL 4 MG/2ML IJ SOLN: 4.0000 mg | Freq: Four times a day (QID) | INTRAMUSCULAR | Status: DC | PRN

## 2014-09-22 MED ORDER — HYDRALAZINE HCL 20 MG/ML IJ SOLN
INTRAMUSCULAR | Status: AC
Start: 2014-09-22 — End: 2014-09-22
  Filled 2014-09-22: qty 1

## 2014-09-22 MED ORDER — ALUM & MAG HYDROXIDE-SIMETH 200-200-20 MG/5ML PO SUSP: 15.0000 mL | ORAL | Status: DC | PRN

## 2014-09-22 MED ORDER — MORPHINE SULFATE 2 MG/ML IJ SOLN: 2.0000 mg | INTRAMUSCULAR | Status: DC | PRN

## 2014-09-22 MED ORDER — LABETALOL HCL 5 MG/ML IV SOLN
10.0000 mg | INTRAVENOUS | Status: AC | PRN
  Administered 2014-09-22 (×4): 10 mg via INTRAVENOUS

## 2014-09-22 MED ORDER — ASPIRIN EC 81 MG PO TBEC: 81.0000 mg | DELAYED_RELEASE_TABLET | Freq: Every day | ORAL | Status: AC

## 2014-09-22 MED ORDER — CLONIDINE HCL 0.1 MG PO TABS
0.1000 mg | ORAL_TABLET | Freq: Two times a day (BID) | ORAL | Status: DC
  Administered 2014-09-22: 0.1 mg via ORAL
  Filled 2014-09-22 (×4): qty 1

## 2014-09-22 MED ORDER — LABETALOL HCL 5 MG/ML IV SOLN
10.0000 mg | INTRAVENOUS | Status: DC | PRN
  Filled 2014-09-22: qty 4

## 2014-09-22 MED ORDER — ASPIRIN EC 81 MG PO TBEC: 81.0000 mg | DELAYED_RELEASE_TABLET | Freq: Every day | ORAL | Status: DC

## 2014-09-22 MED ORDER — CLOPIDOGREL BISULFATE 75 MG PO TABS: 75.0000 mg | ORAL_TABLET | Freq: Every day | ORAL | Status: DC

## 2014-09-22 MED ORDER — MORPHINE SULFATE 4 MG/ML IJ SOLN
INTRAMUSCULAR | Status: AC
  Filled 2014-09-22: qty 1

## 2014-09-22 MED ORDER — COLCHICINE 0.6 MG PO TABS
0.6000 mg | ORAL_TABLET | Freq: Every day | ORAL | Status: DC
  Filled 2014-09-22 (×2): qty 1

## 2014-09-22 MED ORDER — GUAIFENESIN-DM 100-10 MG/5ML PO SYRP: 15.0000 mL | ORAL_SOLUTION | ORAL | Status: DC | PRN

## 2014-09-22 MED ORDER — OXYCODONE HCL 5 MG PO TABS
ORAL_TABLET | ORAL | Status: AC
  Filled 2014-09-22: qty 1

## 2014-09-22 MED ORDER — HYDRALAZINE HCL 20 MG/ML IJ SOLN
5.0000 mg | INTRAMUSCULAR | Status: AC | PRN
  Administered 2014-09-22 (×2): 5 mg via INTRAVENOUS

## 2014-09-22 MED ORDER — ALBUTEROL SULFATE (2.5 MG/3ML) 0.083% IN NEBU
2.5000 mg | INHALATION_SOLUTION | RESPIRATORY_TRACT | Status: DC | PRN
  Filled 2014-09-22: qty 3

## 2014-09-22 MED ORDER — SODIUM CHLORIDE 0.45 % IV SOLN: INTRAVENOUS | Status: DC

## 2014-09-22 MED ORDER — POTASSIUM CHLORIDE CRYS ER 20 MEQ PO TBCR: 20.0000 meq | EXTENDED_RELEASE_TABLET | Freq: Once | ORAL | Status: DC

## 2014-09-22 MED ORDER — PANTOPRAZOLE SODIUM 40 MG PO TBEC
40.0000 mg | DELAYED_RELEASE_TABLET | Freq: Every day | ORAL | Status: DC
  Administered 2014-09-22: 40 mg via ORAL
  Filled 2014-09-22: qty 1

## 2014-09-22 MED ORDER — ACETAMINOPHEN 325 MG PO TABS: 325.0000 mg | ORAL_TABLET | ORAL | Status: DC | PRN

## 2014-09-22 MED ORDER — OXYCODONE HCL 5 MG PO TABS
5.0000 mg | ORAL_TABLET | ORAL | Status: DC | PRN
  Administered 2014-09-22 (×2): 5 mg via ORAL
  Filled 2014-09-22: qty 1

## 2014-09-22 MED ORDER — FUROSEMIDE 40 MG PO TABS
40.0000 mg | ORAL_TABLET | Freq: Every day | ORAL | Status: DC
  Administered 2014-09-22: 40 mg via ORAL
  Filled 2014-09-22 (×4): qty 1

## 2014-09-22 MED ORDER — CLOPIDOGREL BISULFATE 300 MG PO TABS
300.0000 mg | ORAL_TABLET | Freq: Once | ORAL | Status: AC
  Administered 2014-09-22: 300 mg via ORAL
  Filled 2014-09-22: qty 1

## 2014-09-22 MED ORDER — ACETAMINOPHEN 650 MG RE SUPP: 325.0000 mg | RECTAL | Status: DC | PRN

## 2014-09-22 MED ORDER — CEFUROXIME SODIUM 1.5 G IJ SOLR
1.5000 g | Freq: Two times a day (BID) | INTRAMUSCULAR | Status: AC
  Administered 2014-09-22 – 2014-09-23 (×2): 1.5 g via INTRAVENOUS
  Filled 2014-09-22 (×3): qty 1.5

## 2014-09-22 MED ORDER — MORPHINE SULFATE 10 MG/ML IJ SOLN
2.0000 mg | INTRAMUSCULAR | Status: DC | PRN
  Administered 2014-09-22: 4 mg via INTRAVENOUS

## 2014-09-22 MED ORDER — ZOLPIDEM TARTRATE 5 MG PO TABS
10.0000 mg | ORAL_TABLET | Freq: Every evening | ORAL | Status: DC | PRN
  Administered 2014-09-22: 10 mg via ORAL
  Filled 2014-09-22: qty 2

## 2014-09-22 MED ORDER — ALBUTEROL SULFATE HFA 108 (90 BASE) MCG/ACT IN AERS: 2.0000 | INHALATION_SPRAY | Freq: Four times a day (QID) | RESPIRATORY_TRACT | Status: DC | PRN

## 2014-09-22 MED ORDER — MIDAZOLAM HCL 2 MG/2ML IJ SOLN
INTRAMUSCULAR | Status: AC
  Filled 2014-09-22: qty 2

## 2014-09-22 MED ORDER — PHENOL 1.4 % MT LIQD: 1.0000 | OROMUCOSAL | Status: DC | PRN

## 2014-09-22 MED ORDER — HYDRALAZINE HCL 20 MG/ML IJ SOLN: 5.0000 mg | INTRAMUSCULAR | Status: DC | PRN

## 2014-09-22 MED ORDER — LIDOCAINE HCL (PF) 1 % IJ SOLN
INTRAMUSCULAR | Status: AC
  Filled 2014-09-22: qty 30

## 2014-09-22 MED ORDER — LISINOPRIL 40 MG PO TABS
40.0000 mg | ORAL_TABLET | Freq: Every day | ORAL | Status: DC
  Filled 2014-09-22 (×2): qty 1

## 2014-09-22 MED ORDER — OXYCODONE HCL 5 MG PO TABS
5.0000 mg | ORAL_TABLET | Freq: Once | ORAL | Status: AC
  Administered 2014-09-22: 5 mg via ORAL
  Filled 2014-09-22: qty 1

## 2014-09-22 MED ORDER — ACETAMINOPHEN 325 MG PO TABS
650.0000 mg | ORAL_TABLET | Freq: Four times a day (QID) | ORAL | Status: DC | PRN
  Filled 2014-09-22: qty 2

## 2014-09-22 NOTE — Progress Notes (Signed)
Site area: RFA Site Prior to Removal:  Level 0 Pressure Applied For:43min Manual:  yes  Patient Status During Pull:  stable Post Pull Site:  Level 0 Post Pull Instructions Given:  yes Post Pull Pulses Present:  AKA Dressing Applied:  clear Bedrest begins @ 1700 Comments:
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
## 2014-09-22 NOTE — Op Note (Addendum)
Procedure: Abdominal aortogram, left lower extremity runoff, left external iliac artery stent (8 x 60 mm absolute Pro)  Preoperative diagnosis: Nonhealing wound left foot  Postoperative diagnosis: Same  Anesthesia: Local with IV sedation  Operative findings: #1 greater than 90% stenosis left external iliac artery patent left common femoral superficial femoral profunda popliteal and three-vessel runoff to the left foot                                 #2 50% stenosis right common iliac artery  Operative details: After obtaining informed consent, the patient was taken to operating room 16. Patient was placed in supine position on the Angio table. Both groins were prepped and draped in usual sterile fashion. Local anesthesia was inferior of the right common femoral artery. Ultrasound was used to identify the right common femoral artery in an introducer needle was used to cannulate the right common femoral artery without difficulty. An 035 versacore wire was threaded up the abdominal aorta under fluoroscopic guidance. Next a 5 French sheath was placed over the guidewire in the right common femoral artery. This was thoroughly flushed with heparinized saline. A 5 French pigtail catheter was placed over the guidewire and advanced up in the abdominal aorta. An abdominal aortogram was obtained in AP projection. Left and right renal arteries are patent. Infrarenal abdominal aorta is patent. The left and right common iliac arteries are irregular inner surface but patent with no flow limiting stenosis. There is a high-grade greater than 90% stenosis of the mid to distal left external iliac artery. The left internal iliac artery is small but patent. The right external and internal iliac arteries are patent. There is a 50% stenosis of the right common iliac artery.  Next a versacore wire was placed back through the duct catheter and catheter removed. A 5 French crossover catheter was then used to select we  catheterized the left common iliac artery. An 035 Amplatz wire was threaded up and over the aortic bifurcation and into the distal left common femoral artery. The 5 [place] crossover catheter was then removed over the guidewire and exchanged for a 5 French straight catheter. A left lower extremity arteriogram was then obtained through this 5 French straight catheter. The left common femoral artery is patent although there is a 40-50% stenosis in the mid common femoral artery. The profunda and superficial femoral arteries are widely patent. The popliteal anterior tibial posterior tibial and peroneal arteries are all patent. At this point it was decided to intervene on the high-grade 90% left external iliac artery stenosis. The 5 French sheath and the 5 [place] straight catheter removed over the guidewire and exchanged for a 7 [place] long sheath which was placed over the wire up and over the aortic bifurcation to the level of the external iliac artery. The patient was given 10,000 units of intravenous heparin. Measurements of length and diameter were made and a 8 x 60 mm self-expanding absolute Pro stent was selected and this was centered over the area of the lesion using roadmapping. The stent was deployed approximately 2 cm above the femoral head and extending almost to the iliac bifurcation. Balloon was then postdilated with an 8 x 60 angioplasty balloon for 1 minute. The patient experienced some discomfort during deployment of the balloon but this eased off immediately after deflation of the balloon. The 7 French sheath was pulled back up to the aortic bifurcation and a completion  arteriogram was obtained. This showed the stent was widely patent with no evidence of dissection and good flow through the external iliac artery. At this point the sheath was pulled back over the guidewire into the mid right pelvis. Sheath was left in place to be pulled in the holding area after the ACT is less than 175.  The patient  tolerated the procedure well and there were no complications. The patient was taken to the holding area in stable condition.  Operative management: The patient will return for a follow-up appointment in 6 weeks with repeat ABIs and to recheck his left foot wound. He will be placed on Plavix and aspirin for 6 weeks.  Ruta Hinds, MD [facility] [place] Office: [phone] Pager: [phone]

## 2014-09-22 NOTE — Interval H&P Note (Signed)
History and Physical Interval Note:  09/22/2014 11:49 AM  Edwin [name]  has presented today for surgery, with the diagnosis of pvd ulcer left leg  The various methods of treatment have been discussed with the patient and family. After consideration of risks, benefits and other options for treatment, the patient has consented to  Procedure(s): ABDOMINAL AORTAGRAM (N/A) as a surgical intervention .  The patient's history has been reviewed, patient examined, no change in status, stable for surgery.  I have reviewed the patient's chart and labs.  Questions were answered to the patient's satisfaction.     Adora Yeh E

## 2014-09-22 NOTE — Interval H&P Note (Signed)
History and Physical Interval Note:  09/22/2014 7:41 AM  Edwin [name]  has presented today for surgery, with the diagnosis of pvd ulcer left leg  The various methods of treatment have been discussed with the patient and family. After consideration of risks, benefits and other options for treatment, the patient has consented to  Procedure(s): ABDOMINAL AORTAGRAM (N/A) with runoff possible intervention as a surgical intervention .  The patient's history has been reviewed, patient examined, no change in status, stable for surgery.  I have reviewed the patient's chart and labs.  Questions were answered to the patient's satisfaction.     FIELDS,CHARLES E

## 2014-09-23 ENCOUNTER — Inpatient Hospital Stay
Admission: RE | Admit: 2014-09-23 | Discharge: 2015-08-17 | Disposition: A | Discharge status: Other Acute Inpatient Hospital | Payer: Medicaid Other | Source: Ambulatory Visit | Attending: Internal Medicine | Admitting: Internal Medicine

## 2014-09-23 DIAGNOSIS — L97529 Non-pressure chronic ulcer of other part of left foot with unspecified severity: Secondary | ICD-10-CM | POA: Diagnosis not present

## 2014-09-23 DIAGNOSIS — R609 Edema, unspecified: Principal | ICD-10-CM

## 2014-09-23 LAB — CBC
HCT: 52 % (ref 39.0–52.0)
Hemoglobin: 17.3 g/dL — ABNORMAL HIGH (ref 13.0–17.0)
MCH: 29.2 pg (ref 26.0–34.0)
MCHC: 33.3 g/dL (ref 30.0–36.0)
MCV: 87.7 fL (ref 78.0–100.0)
Platelets: 145 10*3/uL — ABNORMAL LOW (ref 150–400)
RBC: 5.93 MIL/uL — ABNORMAL HIGH (ref 4.22–5.81)
RDW: 17.1 % — ABNORMAL HIGH (ref 11.5–15.5)
WBC: 7.2 10*3/uL (ref 4.0–10.5)

## 2014-09-23 LAB — BASIC METABOLIC PANEL
Anion gap: 9 (ref 5–15)
BUN: 19 mg/dL (ref 6–23)
CO2: 26 mmol/L (ref 19–32)
CREATININE: 1.24 mg/dL (ref 0.50–1.35)
Calcium: 8.8 mg/dL (ref 8.4–10.5)
Chloride: 100 mmol/L (ref 96–112)
GFR calc Af Amer: 73 mL/min — ABNORMAL LOW (ref >90)
GFR calc non Af Amer: 63 mL/min — ABNORMAL LOW (ref >90)
Glucose, Bld: 125 mg/dL — ABNORMAL HIGH (ref 70–99)
Potassium: 4.4 mmol/L (ref 3.5–5.1)
Sodium: 135 mmol/L (ref 135–145)
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
## 2014-09-23 NOTE — Progress Notes (Signed)
Discharge report called off to [facility] at [phone]. Pt IV and telemetry removed; pt BP slightly elevated but unable to administer any medication to pt due loss of IV site and po med unavailable from pharmacy. Pt right groin level 0; no active bleeding noted, dsg clean dry and intact. Pt discharged back to [facility] nsg center with [facility] transportation to transport pt. Pt transport here to transport pt to disposition. Attempted x3 to inform [facility] center of pt BP but unsuccessful to reach anyone. P. Amo Calil Amor Rn.

## 2014-09-23 NOTE — Progress Notes (Signed)
Contacted [facility] transportation about patient's pickup time. The transporter stated he would contact the unit once he gets back to the office. On coming nurse made aware.

## 2014-09-23 NOTE — Progress Notes (Signed)
[facility] [place]  Subjective  - Patient comfortable, alert and oriented.   Objective 160/83 69 98.7 F (37.1 C) (Oral) 17 94%  Intake/Output Summary (Last 24 hours) at 09/23/14 0748 Last data filed at 09/23/14 0415  Gross per 24 hour  Intake      0 ml  Output   2450 ml  Net  -2450 ml   Left foot 1.5 cm ulceration left first toe dorsal surface Non palpable distal pulses left foot Right AKA Heart RRR Lungs Non labored breathing   Assessment/Planning: POD # 1 Abdominal aortogram, left lower extremity runoff, left external iliac artery stent (8 x 60 mm absolute Pro) The patient will return for a follow-up appointment in 6 weeks with repeat ABIs and to recheck his left foot wound. He will be placed on Plavix and aspirin for 6 weeks. D/C today  Laurence Slate [facility] 09/23/2014 7:48 AM --  Laboratory Lab Results:  Recent Labs  09/22/14 0908 09/23/14 0355  WBC  --  7.2  HGB 19.0* 17.3*  HCT 56.0* 52.0  PLT  --  145*   BMET  Recent Labs  09/22/14 0908 09/23/14 0355  NA 137 135  K 4.0 4.4  CL 93* 100  CO2  --  26  GLUCOSE 105* 125*  BUN 30* 19  CREATININE 1.40* 1.24  CALCIUM  --  8.8    COAG Lab Results  Component Value Date   INR 1.37 09/09/2013   INR 1.31 09/08/2013   INR 0.94 08/16/2013   No results found for: PTT

## 2014-09-25 ENCOUNTER — Telehealth: Payer: Self-pay | Admitting: Vascular Surgery

## 2014-09-25 NOTE — Telephone Encounter (Signed)
Unable to reach [facility], phone rings with no answer- mailed letter. [facility] should be on EPIC also. dpm

## 2014-09-25 NOTE — Telephone Encounter (Signed)
-----   Message from Mena Goes, RN sent at 09/22/2014  3:18 PM EST ----- Regarding: Schedule   ----- Message -----    From: Elam Dutch, MD    Sent: 09/22/2014   1:51 PM      To: Vvs Charge Pool  [place] groin Right groin puncture Aortogram with left leg runoff Left external iliac artery stent  Follow up with me in 4-5 weeks with bilateral ABI and check foot  Needs asa and plavix x 6 weeks  Ruta Hinds

## 2014-09-28 ENCOUNTER — Other Ambulatory Visit: Payer: Self-pay | Admitting: *Deleted

## 2014-09-28 MED ORDER — OXYCODONE HCL 15 MG PO TABS: ORAL_TABLET | ORAL | Status: DC

## 2014-09-28 NOTE — Telephone Encounter (Signed)
[facility] 

## 2014-10-19 ENCOUNTER — Other Ambulatory Visit: Payer: Self-pay | Admitting: *Deleted

## 2014-10-19 MED ORDER — ZOLPIDEM TARTRATE 10 MG PO TABS: 10.0000 mg | ORAL_TABLET | Freq: Every evening | ORAL | Status: DC | PRN

## 2014-10-19 NOTE — Telephone Encounter (Signed)
[facility] 

## 2014-11-01 ENCOUNTER — Encounter: Payer: Self-pay | Admitting: Vascular Surgery

## 2014-11-02 ENCOUNTER — Ambulatory Visit (INDEPENDENT_AMBULATORY_CARE_PROVIDER_SITE_OTHER): Payer: Medicaid Other | Admitting: Vascular Surgery

## 2014-11-02 ENCOUNTER — Ambulatory Visit (HOSPITAL_COMMUNITY)
Admit: 2014-11-02 | Discharge: 2014-11-02 | Disposition: A | Discharge status: Home / Self Care | Payer: Medicaid Other | Source: Ambulatory Visit | Attending: Vascular Surgery | Admitting: Vascular Surgery

## 2014-11-02 ENCOUNTER — Encounter: Payer: Self-pay | Admitting: Vascular Surgery

## 2014-11-02 VITALS — BP 128/83 | HR 71 | Resp 16 | Ht 68.0 in | Wt 195.0 lb

## 2014-11-02 DIAGNOSIS — Z9889 Other specified postprocedural states: Secondary | ICD-10-CM | POA: Diagnosis not present

## 2014-11-02 DIAGNOSIS — I739 Peripheral vascular disease, unspecified: Secondary | ICD-10-CM | POA: Diagnosis not present

## 2014-11-02 DIAGNOSIS — Z95828 Presence of other vascular implants and grafts: Secondary | ICD-10-CM

## 2014-11-02 DIAGNOSIS — Z9862 Peripheral vascular angioplasty status: Secondary | ICD-10-CM

## 2014-11-02 NOTE — Addendum Note (Signed)
Addended by: Mena Goes on: 11/02/2014 04:04 PM   Modules accepted: Orders
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
## 2014-11-10 ENCOUNTER — Other Ambulatory Visit: Payer: Self-pay

## 2014-11-10 ENCOUNTER — Non-Acute Institutional Stay (SKILLED_NURSING_FACILITY): Payer: Medicaid Other | Admitting: Internal Medicine

## 2014-11-10 ENCOUNTER — Encounter (HOSPITAL_COMMUNITY)
Admission: AD | Admit: 2014-11-10 | Discharge: 2014-11-10 | Disposition: A | Discharge status: Home / Self Care | Payer: Medicaid Other | Source: Skilled Nursing Facility | Attending: Internal Medicine | Admitting: Internal Medicine

## 2014-11-10 DIAGNOSIS — I739 Peripheral vascular disease, unspecified: Secondary | ICD-10-CM

## 2014-11-10 DIAGNOSIS — R69 Illness, unspecified: Secondary | ICD-10-CM | POA: Diagnosis not present

## 2014-11-10 DIAGNOSIS — I10 Essential (primary) hypertension: Secondary | ICD-10-CM | POA: Diagnosis not present

## 2014-11-10 DIAGNOSIS — J438 Other emphysema: Secondary | ICD-10-CM

## 2014-11-10 DIAGNOSIS — I5030 Unspecified diastolic (congestive) heart failure: Secondary | ICD-10-CM | POA: Diagnosis not present

## 2014-11-10 DIAGNOSIS — E785 Hyperlipidemia, unspecified: Secondary | ICD-10-CM | POA: Insufficient documentation

## 2014-11-10 LAB — CBC WITH DIFFERENTIAL/PLATELET
BASOS PCT: 1 % (ref 0–1)
Basophils Absolute: 0.1 10*3/uL (ref 0.0–0.1)
Eosinophils Absolute: 0.3 10*3/uL (ref 0.0–0.7)
Eosinophils Relative: 4 % (ref 0–5)
HCT: 49.4 % (ref 39.0–52.0)
Hemoglobin: 16.1 g/dL (ref 13.0–17.0)
Lymphocytes Relative: 32 % (ref 12–46)
Lymphs Abs: 2.2 10*3/uL (ref 0.7–4.0)
MCH: 31 pg (ref 26.0–34.0)
MCHC: 32.6 g/dL (ref 30.0–36.0)
MCV: 95 fL (ref 78.0–100.0)
Monocytes Absolute: 0.5 10*3/uL (ref 0.1–1.0)
Monocytes Relative: 8 % (ref 3–12)
NEUTROS ABS: 3.7 10*3/uL (ref 1.7–7.7)
Neutrophils Relative %: 55 % (ref 43–77)
Platelets: 162 10*3/uL (ref 150–400)
RBC: 5.2 MIL/uL (ref 4.22–5.81)
RDW: 17.7 % — AB (ref 11.5–15.5)
WBC: 6.7 10*3/uL (ref 4.0–10.5)

## 2014-11-10 LAB — COMPREHENSIVE METABOLIC PANEL
ALBUMIN: 3.7 g/dL (ref 3.5–5.2)
ALT: 14 U/L (ref 0–53)
ANION GAP: 10 (ref 5–15)
AST: 19 U/L (ref 0–37)
Alkaline Phosphatase: 74 U/L (ref 39–117)
BILIRUBIN TOTAL: 0.6 mg/dL (ref 0.3–1.2)
BUN: 27 mg/dL — ABNORMAL HIGH (ref 6–23)
CHLORIDE: 93 mmol/L — AB (ref 96–112)
CO2: 33 mmol/L — ABNORMAL HIGH (ref 19–32)
Calcium: 8.7 mg/dL (ref 8.4–10.5)
Creatinine, Ser: 1.56 mg/dL — ABNORMAL HIGH (ref 0.50–1.35)
GFR calc non Af Amer: 48 mL/min — ABNORMAL LOW (ref >90)
GFR, EST AFRICAN AMERICAN: 55 mL/min — AB (ref >90)
Glucose, Bld: 141 mg/dL — ABNORMAL HIGH (ref 70–99)
POTASSIUM: 3.6 mmol/L (ref 3.5–5.1)
Sodium: 136 mmol/L (ref 135–145)
Total Protein: 6.4 g/dL (ref 6.0–8.3)

## 2014-11-10 LAB — URIC ACID: URIC ACID, SERUM: 10.3 mg/dL — AB (ref 4.0–7.8)

## 2014-11-10 MED ORDER — OXYCODONE HCL 15 MG PO TABS: ORAL_TABLET | ORAL | Status: DC

## 2014-11-10 NOTE — Telephone Encounter (Signed)
RX faxed to [facility] @ [phone]. Phone number [phone]  

## 2014-11-10 NOTE — Progress Notes (Signed)
Patient ID: Edwin [name], male   DOB: Dec 15, 1955, 59 y.o.   MRN: [id number]           This is a routine visit.  Level of care skilled.  Facility [facility].   Chief complaint medical management of chronic issues including peripheral vascular disease status post right below the knee amputation history of CHF diastolic-COPD-gout-   .  History of present illness .  Patient is a very pleasant 59 year old male with the above diagnoses he has been actually quite stable -he came here after undergoing  a right below the knee amputation secondary to infected right foot-this appears to have healed fairly unremarkable -- stay here was complicated with some history of GI bleeding this was thought to be possibly nonsteroid induced-and his hemoglobin has stabilized he did have an endoscopy done which apparently did not show any acute pathology --he continues on Protonix.  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  Patient recently was seen by vascular surgery status post left iliac artery stent-subsequently ABI showed improvement on the left-he does have a history of right carotid stenosis 40-60% left 60-80% again this is followed by vascular        he does have a history COPD but  this has been a non-acute issue here for some time -does ambulate about the facility in a wheelchair and appears to be doing quite well.  He does complain of shoulder pain at times x-rays have showed degenerative changes  As she does receive oxycodone apparently with relief.  Patient has a small wound on his left foot this is followed by wound care and apparently is stable.  We have also been following his blood pressure had been stable for some time but started rising here recently-he had been on low dose lisinopril this has been increased up to 40 mg a day-clonidine 0.1 mg twice a day also has been started and appears to be working recent blood pressures 113/65-118/90 118/83-I did take it manually tonight and got 160/92 although apparently had not received his p.m. clonidine dose yet apparently this significantly decreases with the clonidine-patient states this is been a long-term situation and that he has responded mostly to clonidine in the past     Family medical social history has been reviewed including admission note on 09/13/2013 .  Medications have been reviewed per MAR .  Review of systems  In general no complaints of fever or chills.  Skin does not complaining a rash or itching .-- a small left great toe wound as noted above  Head ears eyes nose mouth and throat-does not complain of visual changes.  Respiratory no complaints shortness breath or cough.  Cardiac no chest pain -- some baseline left foot edema  GI does not complaining  of any abdominal pain nausea or vomiting.  Muscle skeletal -has chronic shoulder pain right greater than left-as noted above.  Neurologic does not complaining of dizziness headache or syncopal type episodes.  Psych no complaint of depression no anxiety apparently depression had been an issue in the past .  Physical exam    Temperature is 98.0 pulse 70 respirations 20 blood pressure taken manually 160/92 previous ones 113/65-133/86 in this range  weight is 198.8 this continues to be again of about 5 pounds over the past 4 months   In general this is a pleasant middle-age male in no distress sitting comfortably in his wheelchair .  His skin is warm and dry --on Top of the left great toe there is a small wound this is followed by wound care     Chest --some diffuse bronchial sounds-there is no labored breathing  .  Heart --  has distant heart sounds -- he has some minimal edema of his left foot but this appears to be fairly baseline .  Abdomen is soft nontender with positive bowel sounds.   Muscle skeletal does have right below the knee amputation-He has some limited range of motion of his right shoulder compared to left-I moves his extremities at baseline I do not note any deformities.  Neurologic is grossly intact his speech is clear.   Psych he is alert and oriented pleasant and appropriate.   Labs  11/10/2014.  Sodium 136 potassium 3.6 BUN 27 creatinine 1.56 CO2 33-of note glucose 141.  WBC 6.7 hemoglobin 16.1 platelets 162.  Uric acid level X.3.  08/05/2014.  BNP 218.6.    05/01/2014.  Cholesterol 146-triglycerides 208-HDL 29-LDL 95.  Sodium 141 potassium 4.5 BUN 25 creatinine 1.26.  Liver function tests within normal limits except albumin of 3.3.  03/13/2014-BNP 634.  TSH 3.843.    03/29/2014.  Sodium 139 potassium 4.5 BUN 27 creatinine 1.23.  WBC 5.7 hemoglobin 13.0 platelets 202.  03/13/2014.  FOY-774  -TSH-3.843  01/20/2014.  WBC 4.9 hemoglobin 9.7 platelets 236.  Marland Kitchen  01/09/2014.  The WBC 5.0 hemoglobin 9.3 platelets 243.  Sodium 139 potassium 4.4 BUN 40 creatinine 1.3.  11/30/2013.  BNP 903.8  Uric acid-6.6  March l 2015.  Liver function tests showed an albumin of 1.6-   Assessment and plan .  #1- Status post partial parathyroidectomy-appears to be doing well with this followup is scheduled.-  #2- History of shoulder pain-again this appears to be arthritic oxycodone  appears to help   #3-history of GI bleed-he is on Protonix hemoglobin has shown improvement at this point will continue to monitor endoscopy did not appear to show a serious etiology--hemoglobin is stable at 16.1.   #4-history peripheral vascular disease again there has been hesitancy for anticoagulation secondary to GI bleed--l this appears stable he does not complaining of pain he does receive oxycodone as needed for pain--he is followed closely by vascular as noted above .  #5-hypertension-- Blood pressure appears significantly improved with addition of clonidine--although apparently his systolic at times is somewhat elevated this does not appear to be consistent or long-lasting    #1-OINOMVE of diastolic CHF --he has gained weight although edema appears to be relatively baseline-he does have a very good appetite-will monitor weights every week liter 2 and then every monthly to ensure stability Clinically he appears stable BNP is unremarkable   #7-history of gout? Pseudogout-he has responded to colchicine he is on this empirically in this at this point appears to be effective   #  8-question mild renal insufficiency-recent metabolic panel shows it has increased somewhat will reduce his Lasix slightly to 40 mg a day and update a metabolic panel in 1 week as well as in 2 weeks  #9-elevated glucose on metabolic panel of 098-JXB does not have a listed history of diabetes-we will check a hemoglobin A1c-also will check CBG when patient will allow for the next week-patient would really prefer to have minimal invasive sticks here again we will see what the hemoglobin A1c tells [place] as well .    Marland Kitchen  #10-COPD-this is stable he is on when necessary nebulizers but apparently does not really need to use much    #11 left great toe wound-this is stable he is no longer on antibiotic this is followed by nursing as well as Dr. Dellia Nims as needed.     [id number]--of note greater than 35 minutes spent  assessing patient-reviewing his chart including consultation notes by vascular-and coordinating and formulating a plan of care for numerous diagnoses-of note greater than 50% of time spent coordinating plan of care

## 2014-11-10 NOTE — Telephone Encounter (Signed)
[facility] request refill

## 2014-11-17 ENCOUNTER — Other Ambulatory Visit (HOSPITAL_COMMUNITY)
Admission: AD | Admit: 2014-11-17 | Discharge: 2014-11-17 | Disposition: A | Discharge status: Home / Self Care | Payer: Medicaid Other | Source: Skilled Nursing Facility | Attending: Internal Medicine | Admitting: Internal Medicine

## 2014-11-17 DIAGNOSIS — R69 Illness, unspecified: Secondary | ICD-10-CM | POA: Diagnosis present

## 2014-11-17 DIAGNOSIS — I509 Heart failure, unspecified: Secondary | ICD-10-CM | POA: Insufficient documentation

## 2014-11-17 DIAGNOSIS — D649 Anemia, unspecified: Secondary | ICD-10-CM | POA: Diagnosis not present

## 2014-11-17 LAB — BASIC METABOLIC PANEL
ANION GAP: 7 (ref 5–15)
BUN: 24 mg/dL — ABNORMAL HIGH (ref 6–23)
CHLORIDE: 96 mmol/L (ref 96–112)
CO2: 34 mmol/L — ABNORMAL HIGH (ref 19–32)
Calcium: 8.8 mg/dL (ref 8.4–10.5)
Creatinine, Ser: 1.57 mg/dL — ABNORMAL HIGH (ref 0.50–1.35)
GFR, EST AFRICAN AMERICAN: 55 mL/min — AB (ref >90)
GFR, EST NON AFRICAN AMERICAN: 47 mL/min — AB (ref >90)
Glucose, Bld: 107 mg/dL — ABNORMAL HIGH (ref 70–99)
POTASSIUM: 4.2 mmol/L (ref 3.5–5.1)
Sodium: 137 mmol/L (ref 135–145)

## 2014-11-18 LAB — HEMOGLOBIN A1C
Hgb A1c MFr Bld: 5.6 % (ref 4.8–5.6)
Mean Plasma Glucose: 114 mg/dL

## 2014-11-21 ENCOUNTER — Encounter: Payer: Self-pay | Admitting: Internal Medicine

## 2014-11-24 ENCOUNTER — Encounter (HOSPITAL_COMMUNITY)
Admission: AD | Admit: 2014-11-24 | Discharge: 2014-11-24 | Disposition: A | Discharge status: Home / Self Care | Payer: Medicaid Other | Source: Skilled Nursing Facility | Attending: Internal Medicine | Admitting: Internal Medicine

## 2014-11-24 DIAGNOSIS — R69 Illness, unspecified: Secondary | ICD-10-CM | POA: Diagnosis not present

## 2014-11-24 LAB — BASIC METABOLIC PANEL
Anion gap: 9 (ref 5–15)
BUN: 26 mg/dL — ABNORMAL HIGH (ref 6–23)
CALCIUM: 8.7 mg/dL (ref 8.4–10.5)
CHLORIDE: 94 mmol/L — AB (ref 96–112)
CO2: 34 mmol/L — AB (ref 19–32)
CREATININE: 1.24 mg/dL (ref 0.50–1.35)
GFR calc Af Amer: 72 mL/min — ABNORMAL LOW (ref >90)
GFR, EST NON AFRICAN AMERICAN: 62 mL/min — AB (ref >90)
GLUCOSE: 101 mg/dL — AB (ref 70–99)
Potassium: 3.9 mmol/L (ref 3.5–5.1)
SODIUM: 137 mmol/L (ref 135–145)

## 2014-11-29 ENCOUNTER — Non-Acute Institutional Stay (SKILLED_NURSING_FACILITY): Payer: Medicaid Other | Admitting: Internal Medicine

## 2014-11-29 DIAGNOSIS — S91109D Unspecified open wound of unspecified toe(s) without damage to nail, subsequent encounter: Secondary | ICD-10-CM

## 2014-12-01 NOTE — Progress Notes (Addendum)
Patient ID: CHRISTIEN FRANKL, male   DOB: Oct 04, 1955, 59 y.o.   MRN: [id number]                PROGRESS NOTE  DATE:  11/29/2014         FACILITY: [facility]                         LEVEL OF CARE:   SNF   Acute Visit                            CHIEF COMPLAINT:  Continued follow-up of an ulcer on his left first toe.     HISTORY OF PRESENT ILLNESS:  Mr. Dunlap is a gentleman who has known peripheral arterial disease, status post right stent in 2003 and a right above-knee amputation before he came to this building.  He has known disease in his left leg, as well, and underwent stenting of his left external iliac artery, I believe three months ago.    He has an ulcer on his dorsal left first toe.  This had been improving.  However, according to the wound care nurse here, it has been waxing and waning.  He is not a diabetic, nor is he the most compliant patient with dressings and suggestions.  However, I have been asked to see this in follow-up.    The patient spends most of his time in a wheelchair.  He does not describe claudication.  He is non-ambulatory.    PHYSICAL EXAMINATION:   SKIN:   INSPECTION:   Left foot:  I cannot feel his peripheral pulses, even in the femoral.  The wound is small, but with some depth, roughly 0.2 to 0.3 cm.  There is no evidence of infection.    ASSESSMENT/PLAN:                  Wound on his dorsal left toe in the setting of known PAD.  I think this probably would benefit from silver collagen, hydrogel, and a foam dressing changed every other day.  The wound care nurse tells me that he does not like collagen because "it hurts".  If not, I would probably do silver alginate.  He is not a known diabetic.

## 2015-01-01 ENCOUNTER — Other Ambulatory Visit: Payer: Self-pay | Admitting: *Deleted

## 2015-01-01 MED ORDER — OXYCODONE HCL 15 MG PO TABS: ORAL_TABLET | ORAL | Status: DC

## 2015-01-01 NOTE — Telephone Encounter (Signed)
[facility] 

## 2015-01-16 ENCOUNTER — Encounter: Payer: Self-pay | Admitting: Internal Medicine

## 2015-01-16 ENCOUNTER — Non-Acute Institutional Stay (SKILLED_NURSING_FACILITY): Payer: Medicaid Other | Admitting: Internal Medicine

## 2015-01-16 DIAGNOSIS — I10 Essential (primary) hypertension: Secondary | ICD-10-CM

## 2015-01-16 DIAGNOSIS — I739 Peripheral vascular disease, unspecified: Secondary | ICD-10-CM

## 2015-01-16 DIAGNOSIS — J42 Unspecified chronic bronchitis: Secondary | ICD-10-CM

## 2015-01-16 DIAGNOSIS — I509 Heart failure, unspecified: Secondary | ICD-10-CM

## 2015-01-16 NOTE — Progress Notes (Signed)
Patient ID: Edwin [name], male   DOB: 13-Dec-1955, 59 y.o.   MRN: [id number]            This is a routine visit.  Level of care skilled.  Facility [facility].   Chief complaint medical management of chronic issues including peripheral vascular disease status post right below the knee amputation history of CHF diastolic-COPD-gout-   .  History of present illness .  Patient is a very pleasant 59 year old male with the above diagnoses he has been actually quite stable -he came here after undergoing  a right below the knee amputation secondary to infected right foot-this appears to have healed fairly unremarkable -- stay here was complicated with some history of GI bleeding this was thought to be possibly nonsteroid induced-and his hemoglobin has stabilized he did have an endoscopy done which apparently did not show any acute pathology --he continues on Protonix.  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  Patient recently was seen by vascular surgery status post left iliac artery stent-subsequently ABI showed improvement on the left-he does have a history of right carotid stenosis 40-60% left 60-80% again this is followed by vascular  Recommendation was to continue aspirin and Plavix-however per review tonight it appears the Plavix was inadvertently discontinued-will restart this        he does have a history COPD but this has been a non-acute issue here for some time -does ambulate about the facility in a wheelchair and appears to be doing quite well.  He does complain of shoulder pain at times x-rays have showed degenerative changes  As he does receive oxycodone apparently with relief.  Patient has a small wound on his left foot this is followed by wound care and Dr Doroteo Glassman this is improving he is receiving silver alginate with foam.  We have also been following his blood pressure--this had been in issue previously we increased his lisinopril to 40 mg a day-also subsequently started clonidine 0.1 mg twice a day-this appears to have helped significantly blood pressures have been quite stable now for some time most recently 132/80 and this appears to be relatively baseline       Family medical social history has been reviewed including admission note on 09/13/2013 .  Medications have been reviewed per MAR .  Review of systems  In general no complaints of fever or chills.  Skin does not complaining a rash or itching .-- a small left great toe wound as noted above  Head ears eyes nose mouth and throat-does not complain of visual changes.  Respiratory no complaints shortness breath or cough.  Cardiac no chest pain -- some baseline left foot edema  GI does not complaining of any abdominal pain nausea or vomiting.  Muscle skeletal -has chronic shoulder pain right greater than left-as noted above.  Neurologic does not complaining of dizziness headache or syncopal  type episodes.  Psych no complaint of depression no anxiety apparently depression had been an issue in the past--says he has insomnia times would like Ambien increased .  Physical exam     Temperature 97.6 pulse 76 respirations 20 blood pressure 132/80 weight appears to be stable at 198.4   In general this is a pleasant middle-age male in no distress sitting comfortably in his wheelchair .  His skin  is warm and dry --per wound care left great toewound is stable and actually improving     Chest --some diffuse bronchial sounds-there is no labored breathing  .  Heart --  has distant heart sounds -- he has some minimal edema of his left foot but this appears to be fairly baseline .  Abdomen is soft nontender with positive bowel sounds.   Muscle skeletal does have right below the knee amputation-He has some limited range of motion of his right shoulder compared to left-I moves his extremities at baseline I do not note any deformities.  Neurologic is grossly intact his speech is clear.   Neurologicis grossly intact no lateralizing findings   Psych he is alert and oriented pleasant and appropriate.   Labs  11/24/2014.  Sodium 137 potassium 3.9 BUN 26 creatinine 1.24 CO2 34.  11/17/2014.  Hemoglobin A1c 5.6.  11/10/2014.  Liver function tests within normal limits.  WBC 6.7 hemoglobin 16.1 platelets 162.  Uric acid 10.3  11/10/2014.  Sodium 136 potassium 3.6 BUN 27 creatinine 1.56 CO2 33-of note glucose 141.  WBC 6.7 hemoglobin 16.1 platelets 162.  Uric acid level X.3.  08/05/2014.  BNP 218.6.    05/01/2014.  Cholesterol 146-triglycerides 208-HDL 29-LDL 95.  Sodium 141 potassium 4.5 BUN 25 creatinine 1.26.  Liver function tests within normal limits except albumin of 3.3.  03/13/2014-BNP 634.  TSH 3.843.    03/29/2014.  Sodium 139 potassium 4.5 BUN 27 creatinine 1.23.  WBC 5.7 hemoglobin 13.0 platelets 202.  03/13/2014.  [id number]  -TSH-3.843  01/20/2014.  WBC 4.9 hemoglobin 9.7 platelets 236.  Marland Kitchen  01/09/2014.  The WBC 5.0 hemoglobin 9.3 platelets 243.  Sodium 139 potassium 4.4 BUN 40 creatinine 1.3.  11/30/2013.  BNP 903.8  Uric acid-6.6  March l 2015.  Liver function tests showed an albumin of 1.6-   Assessment and plan .  #1- Status post partial parathyroidectomy-appears to be doing well -- we'll have to ensure that follow-up has  been arrangedikjm --per note in October that was suggestive for follow-up in about 6 months-procedure was done in September of last year-clinically he has done well Per further review with nursing-patient did have follow-up scheduled in March but apparently did not feel he needed to go-per chart review he was seen Dr. Benjamine Mola for the parathyroidectomy-and tolerated the procedure well again apparently margins were clear--- He has no complaints today--it appears arranging follow-up will be a  challenge at this point patient feels he is doing well  #2- History of shoulder pain-again this appears to be arthritic oxycodone appears to help   #3-history of GI bleed-he is on Protonix hemoglobin has shown improvement at this point will continue to monitor endoscopy did not appear to show a serious etiology--hemoglobin istable at 16.1--back in April we will update this.   #4-history peripheral vascular disease again there has been hesitancy for anticoagulation secondary to GI bleed--l this appears stable he does not complaining of pain he does receive oxycodone as needed for pain--he is followed closely by vascular--he is on aspirin as well as  Plavix per a vascular recommendation it appears this order was written on February 27 .  #5-hypertension-- Blood pressure appears significantly improved with addition of clonidine- Continues on lisinopril 40 mg a day-will update a metabolic panel    #8-HUDJSHF of diastolic CHF - This appears to be  fairly well compensated at this point on Lasix with potassium again will update a metabolic panel--patient feels he would benefit from a slightly increased dose of Lasix in the afternoon-again we have been somewhat conservative here with his renal function being variable-will see what the labs tell us   #7-history of gout? Pseudogout-he has responded to colchicine he is on this empirically in this at this point appears to be effective   #8-question mild renal  insufficiency- This is stabilized recently Will update a BMP    .    Marland Kitchen  #9-COPD-this is stable he is on when necessary nebulizers but apparently does not really need to use much    #10 left great toe wound this is followed by nursing as well as Dr. Dellia Nims as needed.--Per nursing is responding well to topical treatment  #11-insomnia?-Patient apparently is taking energy drinks during the day which I suspect may contribute to the insomnia-he would like Ambien increased we will discuss this with Dr. Garen Grams note greater than 35 minutes spent assessing patient-extensive review his chart-discussion with nursing-as well as discussion with patient about his concerns.  And coordinating plan of care-of note greater than 50% of time spent coordinating plan of care

## 2015-01-17 ENCOUNTER — Encounter (HOSPITAL_COMMUNITY)
Admission: RE | Admit: 2015-01-17 | Discharge: 2015-01-17 | Disposition: A | Discharge status: Home / Self Care | Payer: Medicaid Other | Source: Skilled Nursing Facility | Attending: Internal Medicine | Admitting: Internal Medicine

## 2015-01-17 DIAGNOSIS — I509 Heart failure, unspecified: Secondary | ICD-10-CM | POA: Insufficient documentation

## 2015-01-17 DIAGNOSIS — D649 Anemia, unspecified: Secondary | ICD-10-CM | POA: Insufficient documentation

## 2015-01-17 LAB — CBC WITH DIFFERENTIAL/PLATELET
BASOS ABS: 0.1 10*3/uL (ref 0.0–0.1)
Basophils Relative: 2 % — ABNORMAL HIGH (ref 0–1)
EOS PCT: 4 % (ref 0–5)
Eosinophils Absolute: 0.2 10*3/uL (ref 0.0–0.7)
HCT: 53.1 % — ABNORMAL HIGH (ref 39.0–52.0)
Hemoglobin: 17.1 g/dL — ABNORMAL HIGH (ref 13.0–17.0)
LYMPHS PCT: 29 % (ref 12–46)
Lymphs Abs: 1.6 10*3/uL (ref 0.7–4.0)
MCH: 30.7 pg (ref 26.0–34.0)
MCHC: 32.2 g/dL (ref 30.0–36.0)
MCV: 95.3 fL (ref 78.0–100.0)
MONOS PCT: 10 % (ref 3–12)
Monocytes Absolute: 0.6 10*3/uL (ref 0.1–1.0)
Neutro Abs: 3.2 10*3/uL (ref 1.7–7.7)
Neutrophils Relative %: 55 % (ref 43–77)
Platelets: 177 10*3/uL (ref 150–400)
RBC: 5.57 MIL/uL (ref 4.22–5.81)
RDW: 15 % (ref 11.5–15.5)
WBC: 5.7 10*3/uL (ref 4.0–10.5)

## 2015-01-17 LAB — BASIC METABOLIC PANEL
ANION GAP: 9 (ref 5–15)
BUN: 18 mg/dL (ref 6–20)
CHLORIDE: 95 mmol/L — AB (ref 101–111)
CO2: 34 mmol/L — AB (ref 22–32)
CREATININE: 1.39 mg/dL — AB (ref 0.61–1.24)
Calcium: 8.7 mg/dL — ABNORMAL LOW (ref 8.9–10.3)
GFR calc non Af Amer: 54 mL/min — ABNORMAL LOW (ref >60)
Glucose, Bld: 111 mg/dL — ABNORMAL HIGH (ref 65–99)
POTASSIUM: 3.9 mmol/L (ref 3.5–5.1)
Sodium: 138 mmol/L (ref 135–145)

## 2015-01-22 ENCOUNTER — Non-Acute Institutional Stay (SKILLED_NURSING_FACILITY): Payer: Medicaid Other | Admitting: Internal Medicine

## 2015-01-22 DIAGNOSIS — I739 Peripheral vascular disease, unspecified: Secondary | ICD-10-CM

## 2015-01-22 DIAGNOSIS — S91109S Unspecified open wound of unspecified toe(s) without damage to nail, sequela: Secondary | ICD-10-CM | POA: Diagnosis not present

## 2015-01-23 NOTE — Progress Notes (Addendum)
Patient ID: Edwin [name], male   DOB: 11-12-1955, 59 y.o.   MRN: [id number]                PROGRESS NOTE  DATE:  01/22/2015     FACILITY: [facility]                     LEVEL OF CARE:   SNF   Acute Visit                    CHIEF COMPLAINT:  Follow-up of an ulcer on the left great toe, review of prosthesis suitability.    HISTORY OF PRESENT ILLNESS:  Mr. Edwin [name] is a gentleman who came to [place] after having an amputation above-knee on the right in February 2015.  He had presented with gangrene of the right foot.  He has known PAD.   The area of the right above-knee amputation has healed well.    He has had a chronic wound on the dorsal aspect of his left first toe.  This was present shortly after his arrival here in April 2015.  It did heal over, but reopened.   He has had noninvasive vascular studies showing an ABI of 0.74 with monophasic waveforms in the left lower leg.  He has been to see Dr. Oneida Alar about this.  He has a left external iliac shunt.    He continues to smoke heavily in spite of the request of everybody to consider stopping.    In regards to his benefit from a prosthesis, I think he could benefit.  He is reasonably independent with ADLs.  Does his own bathing and dressing.  I think he is quite capable of learning to use and ambulate with this.  I think it could add a meaningful additional part of his day.  PHYSICAL EXAMINATION:   WOUND EXAM:  Left great toe:  Somewhat pale, but otherwise not a particularly ominous-looking wound over his left great toe.  This looks somewhat dry.  We have been using alginate.  I will change this to silver collagen.   VASCULAR:    ARTERIAL:  Peripheral pulses are not palpable.   MUSCULOSKELETAL:      EXTREMITIES:    RIGHT LOWER EXTREMITY:  Right stump is well healed and quite suitable for a prosthesis.    ASSESSMENT/PLAN:                      Peripheral arterial disease with a wound on the left great toe.  This looks like  it should heal.  However, it has not.  I know that I have used collagen on this in the past.  I would like to go back to this, at least in the short- to median-term.  He follows with vascular surgery already has known PAD. Continued heavy smoker  I think the patient is quite suitable for an above-knee prosthesis.  If this could be arranged, I think this would add to his functional independence and suitability for discharge from this facility.     CPT CODE: 70263

## 2015-02-06 ENCOUNTER — Other Ambulatory Visit: Payer: Self-pay | Admitting: *Deleted

## 2015-02-06 MED ORDER — OXYCODONE HCL 15 MG PO TABS: ORAL_TABLET | ORAL | Status: DC

## 2015-02-06 NOTE — Telephone Encounter (Signed)
[facility] 

## 2015-02-12 ENCOUNTER — Encounter: Payer: Self-pay | Admitting: Vascular Surgery

## 2015-02-14 ENCOUNTER — Non-Acute Institutional Stay (SKILLED_NURSING_FACILITY): Payer: Medicaid Other | Admitting: Internal Medicine

## 2015-02-14 ENCOUNTER — Encounter: Payer: Self-pay | Admitting: Internal Medicine

## 2015-02-14 DIAGNOSIS — N189 Chronic kidney disease, unspecified: Secondary | ICD-10-CM

## 2015-02-14 DIAGNOSIS — J42 Unspecified chronic bronchitis: Secondary | ICD-10-CM | POA: Diagnosis not present

## 2015-02-14 DIAGNOSIS — I10 Essential (primary) hypertension: Secondary | ICD-10-CM | POA: Diagnosis not present

## 2015-02-14 DIAGNOSIS — G8929 Other chronic pain: Secondary | ICD-10-CM

## 2015-02-14 DIAGNOSIS — R609 Edema, unspecified: Secondary | ICD-10-CM | POA: Diagnosis not present

## 2015-02-14 DIAGNOSIS — I739 Peripheral vascular disease, unspecified: Secondary | ICD-10-CM

## 2015-02-14 NOTE — Progress Notes (Signed)
Patient ID: Edwin [name], male   DOB: 30-Oct-1955, 59 y.o.   MRN: [id number]             This is a routine visit.  Level of care skilled.  Facility [facility].   Chief complaint medical management of chronic issues including peripheral vascular disease status post right below the knee amputation history of CHF diastolic-COPD-gout- Acute visit secondary to hypertension   .  History of present illness .  Patient is a very pleasant 59 year old male with the above diagnoses he has been actually quite stable -he came here after undergoing  a right below the knee amputation secondary to infected right foot-this appears to have healed fairly unremarkable  He has recently been approved for a prosthesis and feel he would benefit greatly from this-- stump area has been well-healed for some time He also has a history of a small left great toe wound this is followed by wound care and apparently stable with no sign of infection he does have severe peripheral arterial disease this is been followed by Dr. Louellen Molder is complicated with patient smoking history continues to smoke heavily--Dr. Dellia Nims has discussed this with him in the past  -- stay here was complicated with some history of GI bleeding this was thought to be possibly nonsteroid induced-and his hemoglobin has stabilized he did have an endoscopy done which apparently did not show any acute pathology --he continues on Protonix.  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  Patient recently was seen by vascular surgery status post left iliac artery stent-subsequently ABI showed improvement on the left-he does have a history of right carotid stenosis 40-60% left 60-80% again this is followed by vascular  Recommendation was to continue aspirin and Plavix--Plavix has been restarted         he does have a history COPD but this has been a non-acute issue here for some time -does ambulate about the facility in a wheelchair and appears to be doing quite well.--Although certainly his continue smoking puts him at increased risk  He does complain of shoulder pain at times x-rays have showed degenerative changes  As he does receive oxycodone apparently with relief.   .  We have also been following his blood pressure--this had been in issue previously we increased his lisinopril to 40 mg a day-also subsequently started clonidine 0.1 mg twice a day- This appears to help significantly although his blood pressure this afternoon taken by machine and manually by nursing as well as myself was elevated at 178/106- Appears recent blood pressures have been variable ranging from 118/80-150/94.  He does not complain of any dizziness syncopal-type episodes increase shortness of breath or chest pain or numbness.  He has been outside frequently today and smoking which might aggravate his blood pressure issues.        Family medical social history has been  reviewed including admission note on 09/13/2013 .  Medications have been reviewed per [facility] These include Tylenol.  Albuterol nebulizers every 6 hours when necessary.  Clonidine 0.1 mg twice a day.  Colchicine 0.6 mg daily.  Lasix 40 mg daily.  Fosinopril 40 mg daily.  Oxycodone 15 mg every 4 hours when necessary pain.  Potassium 30 mEq every morning.  Protonix 40 mg  daily.  Enteric-coated aspirin 81 mg daily.  Ambien 10 mg daily at bedtime.  Plavix 75 mg daily .  Review of systems  In general no complaints of fever or chills.  Skin does not complaining a rash or itching .-- a small left great toe wound as noted above  Head ears eyes nose mouth and throat-does not complain of visual changes.  Respiratory no complaints shortness breath or cough.  Cardiac no chest pain -- some baseline left foot edema  GI does not complaining of any abdominal pain nausea or vomiting.  Muscle skeletal -has chronic shoulder pain right greater than left-as noted above but does not really complain of that today.  Neurologic does not complaining of dizziness headache or syncopal type episodes.  Psych no complaint of depression no anxiety apparently depression had been an issue in the past--d .  Physical exam      He is afebrile pulse of 80 respirations 18 blood pressure 176/106 weight is stable at 199   In general this is a pleasant middle-age male in no distress sitting comfortably in his wheelchair .  His skin is warm and dry --per wound care left great toewound is stable  Eyes sclera and conjunctiva are clear visual acuity appears intact pupils are reactive and equal bilaterally.  Oropharynx is clear mucous membranes moist   Chest --some diffuse bronchial sounds-there is no labored breathing  .  Heart --  regular rate and rhythm -- he has some minimal edema of his left foot but this appears to be fairly baseline .  Abdomen is soft nontender with positive bowel sounds.   Muscle skeletal does have right below the knee amputation-He has some limited range of motion of his right shoulder compared to left-I moves his extremities at baseline I do not note any deformities.  .   Neurologicis grossly intact no lateralizing findings--creatinine is intact no lateralizing findings   Psych he is alert and oriented pleasant and appropriate.    Labs  01/17/2015.  Sodium 138 potassium 3.9 CO2 34 BUN 18 creatinine 1.39.  WBC 5.7 hemoglobin 17.1 platelets 177.    11/24/2014.  Sodium 137 potassium 3.9 BUN 26 creatinine 1.24 CO2 34.  11/17/2014.  Hemoglobin A1c 5.6.  11/10/2014.  Liver function tests within normal limits.  WBC 6.7 hemoglobin 16.1 platelets 162.  Uric acid 10.3  11/10/2014.  Sodium 136 potassium 3.6 BUN 27 creatinine 1.56 CO2 33-of note glucose 141.  WBC 6.7 hemoglobin 16.1 platelets 162.  Uric acid level X.3.  08/05/2014.  BNP 218.6.    05/01/2014.  Cholesterol 146-triglycerides 208-HDL 29-LDL 95.  Sodium 141 potassium 4.5 BUN 25 creatinine 1.26.  Liver function tests within normal limits except albumin of 3.3.  03/13/2014-BNP 634.  TSH 3.843.    03/29/2014.  Sodium 139 potassium 4.5 BUN 27 creatinine 1.23.  WBC 5.7 hemoglobin 13.0 platelets 202.  03/13/2014.  [id number]  -TSH-3.843  01/20/2014.  WBC 4.9 hemoglobin 9.7 platelets 236.  Marland Kitchen  01/09/2014.  The WBC 5.0 hemoglobin 9.3 platelets 243.  Sodium 139 potassium 4.4 BUN 40 creatinine 1.3.  11/30/2013.  BNP  903.8  Uric acid-6.6  March l 2015.  Liver function tests showed an albumin of 1.6-   Assessment and plan .  #1- Status post partial parathyroidectomy-appears to be doing well --    -patient did have follow-up scheduled in March but apparently did not feel he needed to go-per chart review he was seen Dr. Benjamine Mola for the parathyroidectomy-and tolerated the procedure well again apparently margins were clear--- He has no complaints today--it appears arranging follow-up will be a  challenge at this point patient feels he is doing well  #2- History of shoulder pain-again this appears to be arthritic oxycodone appears to help   #3-history of GI bleed-he is on Protonix hemoglobin has shown improvement at this point will continue to monitor endoscopy did not appear to show a serious etiology--hemoglobin istable  at 17.1 per recent lab 01/17/2015.   #4-history peripheral vascular disease again there has been hesitancy for anticoagulation secondary to GI bleed--l this appears stable he does not complaining of pain he does receive oxycodone as needed for pain--he is followed closely by vascular--he is on aspirin as well as Plavix per a vascular recommendation  .  #5-hypertension-- Blood pressure appears to have improved with addition of clonidine-however his blood pressure is elevated today Will give an extra dose of clonidine now 0.1 mg --nursing to recheck blood pressure in one hour-he is completely asymptomatic at this point at baseline-. His blood pressures occasionally he does have somewhat elevated systolics will order more frequent blood pressure testing-- if patient will allow-- consider increasing his dose of clonidine if consistently elevated         #4-HWTUUEK of diastolic CHF - This appears to be  fairly well compensated at this point on Lasix with potassium  we have been somewhat conservative here with his renal function being variable-nonetheless this appears stable his weights have stabilized recently   #7-history of gout? Pseudogout-he has responded to colchicine he is on this empirically in this at this point appears to be effective   #8-question mild renal insufficiency- This appears to have stabilized with recent creatinine of 1.39 on June 22 would like to update this  if patient will allow-    .    Marland Kitchen  #9-COPD-this is stable he is on when necessary nebulizers but apparently does not really need to use much    #10 left great toe wound this is followed by nursing as well as Dr. Dellia Nims as needed.--Per nursing is responding well to topical    Addendum I did recheck the patient and blood pressure had come down after 2 hours to 136/82 nursing also checked this an hour after receiving the additional clonidine and it was coming down-clinically he continues to be stable at this point  again will monitor his blood pressures closely  CPT-99310-of note greater than 35 minutes spent assessing patient-rechecking patient-reviewing his chart-and coordinating and formulating a plan of care for numerous diagnoses-of note greater than 50% of time spent coordinating plan of care

## 2015-02-15 ENCOUNTER — Encounter: Payer: Self-pay | Admitting: Vascular Surgery

## 2015-02-15 ENCOUNTER — Encounter (HOSPITAL_COMMUNITY)
Admission: AD | Admit: 2015-02-15 | Discharge: 2015-02-15 | Disposition: A | Discharge status: Home / Self Care | Payer: Medicaid Other | Source: Skilled Nursing Facility | Attending: Internal Medicine | Admitting: Internal Medicine

## 2015-02-15 ENCOUNTER — Ambulatory Visit (INDEPENDENT_AMBULATORY_CARE_PROVIDER_SITE_OTHER): Payer: Medicaid Other | Admitting: Vascular Surgery

## 2015-02-15 VITALS — BP 143/80 | HR 88 | Temp 98.4°F | Resp 18 | Ht 68.0 in | Wt 198.0 lb

## 2015-02-15 DIAGNOSIS — L97521 Non-pressure chronic ulcer of other part of left foot limited to breakdown of skin: Secondary | ICD-10-CM | POA: Diagnosis not present

## 2015-02-15 DIAGNOSIS — F17210 Nicotine dependence, cigarettes, uncomplicated: Secondary | ICD-10-CM | POA: Insufficient documentation

## 2015-02-15 DIAGNOSIS — I6523 Occlusion and stenosis of bilateral carotid arteries: Secondary | ICD-10-CM

## 2015-02-15 DIAGNOSIS — I10 Essential (primary) hypertension: Secondary | ICD-10-CM | POA: Insufficient documentation

## 2015-02-15 LAB — BASIC METABOLIC PANEL
Anion gap: 12 (ref 5–15)
BUN: 16 mg/dL (ref 6–20)
CO2: 35 mmol/L — ABNORMAL HIGH (ref 22–32)
CREATININE: 1.2 mg/dL (ref 0.61–1.24)
Calcium: 8.9 mg/dL (ref 8.9–10.3)
Chloride: 91 mmol/L — ABNORMAL LOW (ref 101–111)
GFR calc Af Amer: >60 mL/min (ref >60)
Glucose, Bld: 109 mg/dL — ABNORMAL HIGH (ref 65–99)
Potassium: 4.2 mmol/L (ref 3.5–5.1)
Sodium: 138 mmol/L (ref 135–145)

## 2015-02-15 NOTE — Addendum Note (Signed)
Addended by: Dorthula Rue L on: 02/15/2015 10:42 AM   Modules accepted: Orders
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
## 2015-02-19 ENCOUNTER — Encounter: Payer: Self-pay | Admitting: Vascular Surgery

## 2015-02-21 ENCOUNTER — Encounter: Payer: Self-pay | Admitting: Internal Medicine

## 2015-02-21 ENCOUNTER — Non-Acute Institutional Stay (SKILLED_NURSING_FACILITY): Payer: Medicaid Other | Admitting: Internal Medicine

## 2015-02-21 DIAGNOSIS — I5030 Unspecified diastolic (congestive) heart failure: Secondary | ICD-10-CM | POA: Diagnosis not present

## 2015-02-21 DIAGNOSIS — I10 Essential (primary) hypertension: Secondary | ICD-10-CM

## 2015-02-21 DIAGNOSIS — N289 Disorder of kidney and ureter, unspecified: Secondary | ICD-10-CM | POA: Diagnosis not present

## 2015-02-21 NOTE — Progress Notes (Signed)
Patient ID: Edwin [name], male   DOB: [birth year], 59 y.o.   MRN: [id number]              This is an acute visit.  Level of care skilled.  Facility [facility].   Chief complaint  Acute visit follow-up hypertension   .  History of present illness .  Patient is a very pleasant 59 year old male with the above diagnoses he has been actually quite stable -he came here after undergoing  a right below the knee amputation secondary to infected right foot-this appears to have healed fairly unremarkable  He has recently been approved for a prosthesis and feel he would benefit greatly from this-- stump area has been well-healed for some time H .  We have also been following his blood pressure--this had been in issue previously we increased his lisinopril to 40 mg a day-also subsequently started clonidine 0.1 mg twice a day-  This appears to have helped although occasionally will have systolic spikes-nursing has left me a log of recent blood pressures and appears his systolics are variable ranging from 1:30 to 170 often it appears more in the 150 range actually I got 158/88 on exam today.  He states he had been on metoprolol as well as clonidine in addition to lisinopril previously.  He would like his metoprolol restarted feels his blood pressure would respond well to this.  I have noticed pulses 1argely in the 80s occasionally 70s  He does have a history of suspected diastolic CHF he is on Lasix again also on an Ace as well        Family medical social history has been reviewed including admission note on 09/13/2013 .  Medications have been reviewed per [facility] These include Tylenol.  Albuterol nebulizers every 6 hours when necessary.  Clonidine 0.1 mg twice a day.  Colchicine 0.6 mg daily.  Lasix 40 mg daily.  Lisinopril 40 mg daily.  Oxycodone 15 mg every 4 hours when necessary pain.  Potassium 30 mEq every morning.  Protonix 40 mg daily.  Enteric-coated aspirin 81 mg  daily.  Ambien 10 mg daily at bedtime.  Plavix 75 mg daily .  Review of systems  In general no complaints of fever or chills.  Skin does not complaining a rash or itching .- Head ears eyes nose mouth and throat-does not complain of visual changes.  Respiratory no complaints shortness breath or cough.  Cardiac no chest pain -- some baseline left foot edema  GI does not complaining of any abdominal pain nausea or vomiting.  Muscle skeletal -has chronic shoulder pain right greater than left-as noted above but does not really complain of that today.  Neurologic does not complaining of dizziness headache or syncopal type episodes.  Psych no complaint of depression no anxiety apparently depression had been an issue in the past--d .  Physical exam    Is afebrile pulse 84 respirations 18 blood pressure taken manually 158/88  He is afebrile pulse 84 respirations 18 blood pressure 158/88    In general this is a pleasant middle-age male in no distress sitting comfortably in his wheelchair .  His skin is warm and dry --per wound care left great toewound is stable  Eyes sclera and conjunctiva are clear visual acuity appears intact pupils are reactive and equal bilaterally.  Chest has somewhat shallow air entry with baselie bronchial sounds  .  Heart --  regular rate and rhythm -- he has some minimal edema of his left foot  but this appears to be fairly baseline .      Muscle skeletal does have right below the knee amputation- -I moves his extremities at baseline I do not note any deformities  Continues to ambulate independently with his wheelchair.  Marland Kitchen   Neurologicis grossly intact no lateralizing findings-cranial nerves  intact no lateralizing findings   Psych he is alert and oriented pleasant and appropriate.   Labs    01/17/2015.  Sodium 138 potassium 3.9 CO2 34 BUN 18 creatinine 1.39.  WBC 5.7 hemoglobin 17.1 platelets 177.    11/24/2014.  Sodium 137 potassium 3.9  BUN 26 creatinine 1.24 CO2 34.  11/17/2014.  Hemoglobin A1c 5.6.  11/10/2014.  Liver function tests within normal limits.  WBC 6.7 hemoglobin 16.1 platelets 162.  Uric acid 10.3  11/10/2014.  Sodium 136 potassium 3.6 BUN 27 creatinine 1.56 CO2 33-of note glucose 141.  WBC 6.7 hemoglobin 16.1 platelets 162.  Uric acid level X.3.  08/05/2014.  BNP 218.6.    05/01/2014.  Cholesterol 146-triglycerides 208-HDL 29-LDL 95.  Sodium 141 potassium 4.5 BUN 25 creatinine 1.26.  Liver function tests within normal limits except albumin of 3.3.  03/13/2014-BNP 634.  TSH 3.843.    03/29/2014.  Sodium 139 potassium 4.5 BUN 27 creatinine 1.23.  WBC 5.7 hemoglobin 13.0 platelets 202.  03/13/2014.  HQI-696  -TSH-3.843  01/20/2014.  WBC 4.9 hemoglobin 9.7 platelets 236.  Marland Kitchen  01/09/2014.  The WBC 5.0 hemoglobin 9.3 platelets 243.  Sodium 139 potassium 4.4 BUN 40 creatinine 1.3.  11/30/2013.  BNP 903.8  Uric acid-6.6  March l 2015.  Liver function tests showed an albumin of 1.6-   Assessment and plan .  #2-[id number] systolics appear to be still somewhat elevated despite initiation of clonidine-Will start low-dose beta blocker metoprolol 12.5 mg twice a day-monitor blood pressure and pulse 2 times a day with a log for provider review next week-appears his pulses have been stable I do not see bradycardia per chart review but this will have to be watched-clinically patient would really like to have the metoprolol started feeling this would help.  . #2 diastolic CHF this appears to be relatively stable he is on Lasix this is complicated with a history of renal insufficiency however recent BUN and creatinine of 1.2-BUN of 16 appears actually to be quite stable for him.  #3-renal insufficiency again as stated above this appears to be stable.  [id number]     .

## 2015-02-22 ENCOUNTER — Encounter (HOSPITAL_COMMUNITY): Payer: Medicaid Other

## 2015-02-22 ENCOUNTER — Encounter: Payer: Medicaid Other | Admitting: Family

## 2015-02-22 ENCOUNTER — Encounter: Payer: Self-pay | Admitting: Family

## 2015-02-22 NOTE — Progress Notes (Signed)
Filed Vitals:   02/22/15 1535 02/22/15 1542 02/22/15 1543  BP: 142/90 134/89 149/90  Pulse: 72 75 70  Temp: 97.5 F (36.4 C)    Resp: 16    Height: 5\' 8"  (1.727 m)    Weight: 198 lb (89.812 kg)    SpO2: 92%

## 2015-02-22 NOTE — Progress Notes (Deleted)
[facility] [place] HISTORY AND PHYSICAL   MRN : [id number]  History of Present Illness:   Edwin [name] is a 59 y.o. male   ***  Pt. {Actions; denies-reports:120008::"denies"} claudication in ***.  Pt. {Actions; denies-reports:120008::"denies"} rest pain; {Actions; denies-reports:120008::"denies"} night pain {Actions; denies-reports:120008::"denies"} non healing ulcers on {Right/left upper lower:12843} extremity. Patient has {FINDINGS; POSITIVE NEGATIVE:[phone]::"Negative"} history of TIA or stroke symptom.  The patient {Actions; denies-reports:120008::"denies"} amaurosis fugax or monocular blindness.  The patient  {Actions; denies-reports:120008::"denies"} facial drooping.  Pt. {Actions; denies-reports:120008::"denies"} hemiplegia.  The patient {Actions; denies-reports:120008::"denies"} receptive or expressive aphasia.  Pt. {Actions; denies-reports:120008::"denies"} weakness; {upper/lower ext:18074};  The patient {HAS/HAS HUD:14970::"YOV not"} had back or abdominal pain.  Pt Diabetic: {yes/no:20286} Pt smoker: {Smoker?:15292}  Pt meds include: Statin :{yes/no:20286} Betablocker: {yes/no:20286} ASA: {yes/no:20286} Other anticoagulants/antiplatelets: ***  Current Outpatient Prescriptions  Medication Sig Dispense Refill  . oxyCODONE (ROXICODONE) 15 MG immediate release tablet Take one tablet by mouth every 4 hours as needed for pain 180 tablet 0  . zolpidem (AMBIEN) 10 MG tablet Take 1 tablet (10 mg total) by mouth at bedtime as needed for sleep. 30 tablet 5   No current facility-administered medications for this visit.    Past Medical History  Diagnosis Date  . ERECTILE DYSFUNCTION 11/27/2007    Qualifier: Diagnosis of  By: Garen Grams    . HYPERLIPIDEMIA 11/27/2007    Qualifier: Diagnosis of  By: Garen Grams    . HYPERTENSION, BENIGN 05/23/2009    Qualifier: Diagnosis of  By: Melvyn Novas MD, Christena Deem   . Coronary artery disease     Cardiac  catheterization in 2008 showed 99% stenosis in proximal left circumflex which was treated with Taxus drug-eluting stent. There was mild RCA and LAD disease with normal ejection fraction.  . Tobacco use   . CHF (congestive heart failure)   . Hypertension   . Asthma   . Traumatic amputation of leg(s) (complete) (partial), unilateral, at or above knee, without mention of complication   . Pressure ulcer, lower back(707.03)   . Other severe protein-calorie malnutrition   . Anemia, unspecified   . Atrial fibrillation   . Gastric ulcer, unspecified as acute or chronic, without mention of hemorrhage, perforation, or obstruction   . PAD (peripheral artery disease)     Previous right SFA stent in 2003. Directional atherectomy right SFA in 01/2013  . Carotid artery occlusion   . Heart murmur   . COPD 11/27/2007    Qualifier: Diagnosis of  By: Garen Grams    . Pneumonia 2000  . Arthritis     "shoulders" (09/22/2014)    Social History History  Substance Use Topics  . Smoking status: Current Every Day Smoker -- 1.00 packs/day for 41 years  . Smokeless tobacco: Never Used  . Alcohol Use: 0.0 oz/week    0 Standard drinks or equivalent per week     Comment: none since moving to nursing home in March 2015    Family History Family History  Problem Relation Age of Onset  . Adopted: Yes  . Heart disease Maternal Grandfather   . Heart disease Paternal Grandfather     Surgical History Past Surgical History  Procedure Laterality Date  . Nasal sinus surgery  2004  . Acne cyst removal    . Femoral artery stent Right 2003    [name] 09/22/2014  . Endarterectomy Left 08/16/2013    Procedure: Exploration of Left Neck/Fix Bleeding;  Surgeon: Elam Dutch, MD;  Location: [facility];  Service: Vascular;  Laterality: Left;  . Esophagogastroduodenoscopy N/A 09/08/2013    Procedure: ESOPHAGOGASTRODUODENOSCOPY (EGD);  Surgeon: Cleotis Nipper, MD;  Location: [facility] ENDOSCOPY;  Service: Endoscopy;   Laterality: N/A;  . Flexible sigmoidoscopy N/A 09/08/2013    Procedure: FLEXIBLE SIGMOIDOSCOPY;  Surgeon: Cleotis Nipper, MD;  Location: [facility] ENDOSCOPY;  Service: Endoscopy;  Laterality: N/A;  unprepp  . Amputation Right 09/09/2013    Procedure: AMPUTATION ABOVE KNEE;  Surgeon: Rosetta Posner, MD;  Location: [facility] OR;  Service: Vascular;  Laterality: Right;  . Esophagogastroduodenoscopy (egd) with propofol N/A 01/05/2014    Procedure: ESOPHAGOGASTRODUODENOSCOPY (EGD) WITH PROPOFOL;  Surgeon: Cleotis Nipper, MD;  Location: [facility] ENDOSCOPY;  Service: Endoscopy;  Laterality: N/A;  . Parotidectomy Right 03/29/2014    Procedure: PAROTIDECTOMY;  Surgeon: Ascencion Dike, MD;  Location: [facility];  Service: ENT;  Laterality: Right;  . Abdominal aortagram N/A 02/23/2013    Procedure: ABDOMINAL Maxcine Ham;  Surgeon: Wellington Hampshire, MD;  Location: [facility] CATH LAB;  Service: Cardiovascular;  Laterality: N/A;  . Atherectomy N/A 03/02/2013    Procedure: ATHERECTOMY;  Surgeon: Wellington Hampshire, MD;  Location: [facility] CATH LAB;  Service: Cardiovascular;  Laterality: N/A;  . Iliac artery stent Left 09/22/2014  . Abdominal aortagram N/A 09/22/2014    Procedure: ABDOMINAL Maxcine Ham;  Surgeon: Elam Dutch, MD;  Location: [facility] CATH LAB;  Service: Cardiovascular;  Laterality: N/A;    Allergies  Allergen Reactions  . Codeine Nausea Only    Current Outpatient Prescriptions  Medication Sig Dispense Refill  . oxyCODONE (ROXICODONE) 15 MG immediate release tablet Take one tablet by mouth every 4 hours as needed for pain 180 tablet 0  . zolpidem (AMBIEN) 10 MG tablet Take 1 tablet (10 mg total) by mouth at bedtime as needed for sleep. 30 tablet 5   No current facility-administered medications for this visit.     REVIEW OF SYSTEMS: See HPI for pertinent positives and negatives.  Physical Examination Filed Vitals:   02/22/15 1535 02/22/15 1542 02/22/15 1543  BP: 142/90 134/89 149/90  Pulse: 72 75 70  Temp: 97.5 F (36.4 C)    Resp:  16    Height: 5\' 8"  (1.727 m)    Weight: 198 lb (89.812 kg)    SpO2: 92%     Body mass index is 30.11 kg/(m^2).  General:  WDWN in NAD Gait: Normal HENT: WNL Eyes: Pupils equal Pulmonary: normal non-labored breathing , without Rales, rhonchi,  wheezing Cardiac: RRR, no murmur detected  Abdomen: soft, NT, no masses palpated Skin: no rashes, ulcers noted;  no Gangrene , no cellulitis; no open wounds.   VASCULAR EXAM  Carotid Bruits Right Left   {FINDINGS; POSITIVE NEGATIVE:[id number]} {FINDINGS; POSITIVE NEGATIVE:6702796390}                             VASCULAR EXAM: Extremities {With/without:5700} ischemic changes *** {With/without:5700} Gangrene; {With/without:5700} open wounds.  LE Pulses Right Left       FEMORAL  {PE DOPPLER EXAM ORTHOSURG:330610::"*** palpable"}  {PE DOPPLER EXAM ORTHOSURG:330610::"*** palpable"}        POPLITEAL  {PE DOPPLER EXAM ORTHOSURG:330610::"*** palpable"}   {PE DOPPLER EXAM ORTHOSURG:330610::"*** palpable"}       POSTERIOR TIBIAL  {PE DOPPLER EXAM ORTHOSURG:330610::"*** palpable"}   {PE DOPPLER EXAM ORTHOSURG:330610::"*** palpable"}        DORSALIS PEDIS      ANTERIOR TIBIAL {PE DOPPLER EXAM ORTHOSURG:330610::"*** palpable"}   {PE DOPPLER EXAM ORTHOSURG:330610::"*** palpable"}        PERONEAL {PE DOPPLER EXAM ORTHOSURG:330610::"*** Palpable"}   {PE DOPPLER EXAM ORTHOSURG:330610::"*** Palpable"}      Musculoskeletal: no muscle wasting or atrophy; no peripheral edema  Neurologic: A&O X 3; Appropriate Affect ;  SENSATION: normal; MOTOR FUNCTION: 5/5 Symmetric, CN 2-12 intact Speech is fluent/normal   Significant Diagnostic Studies:   Non-Invasive Vascular Imaging:   ASSESSMENT:  ROBI DEWOLFE is a 59 y.o. male ***  PLAN:   Based on today's exam and non-invasive vascular lab results, the patient will follow up in *** with  the following tests: ***. I discussed in depth with the patient the nature of atherosclerosis, and emphasized the importance of maximal medical management including strict control of blood pressure, blood glucose, and lipid levels, obtaining regular exercise, and cessation of smoking.  The patient is aware that without maximal medical management the underlying atherosclerotic disease process will progress, limiting the benefit of any interventions. Consideration for repair of AAA would be made when the size approaches 4.8 or 5.0 cm, growth > 1 cm/yr, and symptomatic status. The patient was given information about stroke prevention and what symptoms should prompt the patient to seek immediate medical care. The patient was given information about AAA including signs, symptoms, treatment,  what symptoms should prompt the patient to seek immediate medical care, and how to minimize the risk of enlargement and rupture of aneurysms. The patient was given information about PAD including signs, symptoms, treatment, what symptoms should prompt the patient to seek immediate medical care, and risk reduction measures to take. Thank you for allowing [place] to participate in this patient's care.  Clemon Chambers, RN, MSN, FNP-C [facility] Office: [phone]  Clinic MD: [facility]  02/22/2015 3:44 PM

## 2015-03-07 ENCOUNTER — Encounter: Payer: Self-pay | Admitting: Internal Medicine

## 2015-03-07 ENCOUNTER — Non-Acute Institutional Stay (SKILLED_NURSING_FACILITY): Payer: Medicaid Other | Admitting: Internal Medicine

## 2015-03-07 DIAGNOSIS — R609 Edema, unspecified: Secondary | ICD-10-CM

## 2015-03-07 DIAGNOSIS — I10 Essential (primary) hypertension: Secondary | ICD-10-CM

## 2015-03-07 DIAGNOSIS — N289 Disorder of kidney and ureter, unspecified: Secondary | ICD-10-CM | POA: Diagnosis not present

## 2015-03-07 NOTE — Progress Notes (Signed)
Patient ID: Edwin [name], male   DOB: [birth year], 59 y.o.   MRN: [id number]               This is an acute visit.  Level of care skilled.  Facility [facility].   Chief complaint  Acute visit follow-upedema--hypertension   .  History of present illness .  Patient is a very pleasant 59 year old male with the above diagnoses he has been actually quite stable -he came here after undergoing  a right below the knee amputation secondary to infected right foot-this appears to have healed fairly unremarkable  He has recently been approved for a prosthesis and feel he would benefit greatly from this-- stump area has been well-healed for some time H .  We have also been following his blood pressure--this had been in issue previously we increased his lisinopril to 40 mg a day-also subsequently started clonidine 0.1 mg twice a day-recently added low-dose metoprolol 12.5 mg twice a day-he states he's been on metoprolol before tolerated well and thought it would help his blood pressure  This appears to have helped although occasionally will have systolic spikes Recent blood pressures 148/86 121/70 153/88 136/79. To be in this range.  Pulses appear to run in the 70s and 80s largely lowest one that I see is 70  He does have a history of suspected diastolic CHF he is on Lasix again also on an Ace inhibitor as well  Patient feels he is having some increased edema in his left leg Following up on this does not really complain of pain here his Lasix dose currently is 40 mg daily        Family medical social history has been reviewed including admission note on 09/13/2013 .  Medications have been reviewed per [facility] These include Tylenol.  Albuterol nebulizers every 6 hours when necessary.  Clonidine 0.1 mg twice a day.  Colchicine 0.6 mg daily.  Lasix 40 mg daily.  Lisinopril 40 mg daily.  Oxycodone 15 mg every 4 hours when necessary pain.  Potassium 30 mEq every  morning.  Protonix 40 mg daily.  Enteric-coated aspirin 81 mg daily.  Ambien 10 mg daily at bedtime.  Plavix 75 mg daily .  Review of systems  In general no complaints of fever or chills.  Skin does not complaining a rash or itching .- Head ears eyes nose mouth and throat-does not complain of visual changes.  Respiratory no complaints shortness breath or cough.  Cardiac no chest pain --  has some increased left leg calf edema GI does not complaining of any abdominal pain nausea or vomiting.  Muscle skeletal -has chronic shoulder pain right greater than left-but does not really complain of that today.  Neurologic does not complaining of dizziness headache or syncopal type episodes.  Psych no complaint of depression no anxiety apparently depression had been an issue in the past--d .  Physical exam    He is afebrile pulse 79 respirations 20 blood pressure 136/84 weight is 199.6 this appears to be stable  He is afebrile pulse 84 respirations 18 blood pressure 158/88    In general this is a pleasant middle-age male in no distress sitting comfortably in his wheelchair .  His skin is warm and dry --per wound care left great toewound is stable  Eyes sclera and conjunctiva are clear visual acuity appears intact pupils are reactive and equal bilaterally.  Chest has somewhat shallow air entry with scattered rhonchi which is not new  .  Heart --  regular rate and rhythm -- has some mildly increased edema of his left lower leg-this is nonerythematous nontender .      Muscle skeletal does have right below the knee amputation- -I moves his extremities at baseline I do not note any deformities  Continues to ambulate independently with his wheelchair.  Marland Kitchen   Neurologicis grossly intact no lateralizing findings-cranial nerves  intact no lateralizing findings   Psych he is alert and oriented pleasant and appropriate.   Labs 02/15/2015.  Sodium 138 potassium 4.2 BUN 16 creatinine  1.2.     01/17/2015.  Sodium 138 potassium 3.9 CO2 34 BUN 18 creatinine 1.39.  WBC 5.7 hemoglobin 17.1 platelets 177.    11/24/2014.  Sodium 137 potassium 3.9 BUN 26 creatinine 1.24 CO2 34.  11/17/2014.  Hemoglobin A1c 5.6.  11/10/2014.  Liver function tests within normal limits.  WBC 6.7 hemoglobin 16.1 platelets 162.  Uric acid 10.3  11/10/2014.  Sodium 136 potassium 3.6 BUN 27 creatinine 1.56 CO2 33-of note glucose 141.  WBC 6.7 hemoglobin 16.1 platelets 162.  Uric acid level X.3.  08/05/2014.  BNP 218.6.    05/01/2014.  Cholesterol 146-triglycerides 208-HDL 29-LDL 95.  Sodium 141 potassium 4.5 BUN 25 creatinine 1.26.  Liver function tests within normal limits except albumin of 3.3.  03/13/2014-BNP 634.  TSH 3.843.    03/29/2014.  Sodium 139 potassium 4.5 BUN 27 creatinine 1.23.  WBC 5.7 hemoglobin 13.0 platelets 202.  03/13/2014.  [id number]  -TSH-3.843  01/20/2014.  WBC 4.9 hemoglobin 9.7 platelets 236.  Marland Kitchen  01/09/2014.  The WBC 5.0 hemoglobin 9.3 platelets 243.  Sodium 139 potassium 4.4 BUN 40 creatinine 1.3.  11/30/2013.  BNP 903.8  Uric acid-6.6  March l 2015.  Liver function tests showed an albumin of 1.6-   Assessment and plan .  #1-hypertension- This appears to have improved somewhat but will have to continue to monitor as noted above he has recently been started on low-dose metoprolol 12.5 mg twice a day he continues on clonidine 0.1 mg twice a day-lisinopril 40 mg daily-  . #2 diastolic CHF  His weight appears to be stable although he does have some mildly increased lower extremity edema-would like to get weights every week  to get these more frequently and weigh patient tomorrow so we can get a closer feel for how his weights are trending.--Also consider a venous Doppler of the leg although today does not overtly have a DVT type presentation  #3-renal insufficiency this appears to be relatively stable with creatinine  1.2 BUN of 16 on most recent lab  [id number]     .

## 2015-03-08 ENCOUNTER — Encounter (HOSPITAL_COMMUNITY)
Admission: AD | Admit: 2015-03-08 | Discharge: 2015-03-08 | Disposition: A | Discharge status: Home / Self Care | Payer: Medicaid Other | Source: Skilled Nursing Facility | Attending: Internal Medicine | Admitting: Internal Medicine

## 2015-03-08 ENCOUNTER — Ambulatory Visit (HOSPITAL_COMMUNITY)
Admission: RE | Admit: 2015-03-08 | Discharge: 2015-03-08 | Disposition: A | Discharge status: Home / Self Care | Payer: Medicaid Other | Source: Ambulatory Visit | Attending: Internal Medicine | Admitting: Internal Medicine

## 2015-03-08 ENCOUNTER — Ambulatory Visit (HOSPITAL_COMMUNITY): Payer: Medicaid Other

## 2015-03-08 DIAGNOSIS — I10 Essential (primary) hypertension: Secondary | ICD-10-CM | POA: Insufficient documentation

## 2015-03-08 DIAGNOSIS — I2510 Atherosclerotic heart disease of native coronary artery without angina pectoris: Secondary | ICD-10-CM | POA: Insufficient documentation

## 2015-03-08 DIAGNOSIS — R600 Localized edema: Secondary | ICD-10-CM | POA: Insufficient documentation

## 2015-03-08 DIAGNOSIS — Z720 Tobacco use: Secondary | ICD-10-CM | POA: Diagnosis not present

## 2015-03-08 DIAGNOSIS — F17200 Nicotine dependence, unspecified, uncomplicated: Secondary | ICD-10-CM | POA: Diagnosis not present

## 2015-03-08 LAB — BASIC METABOLIC PANEL
ANION GAP: 9 (ref 5–15)
BUN: 19 mg/dL (ref 6–20)
CO2: 36 mmol/L — ABNORMAL HIGH (ref 22–32)
CREATININE: 1.25 mg/dL — AB (ref 0.61–1.24)
Calcium: 8.5 mg/dL — ABNORMAL LOW (ref 8.9–10.3)
Chloride: 93 mmol/L — ABNORMAL LOW (ref 101–111)
GFR calc Af Amer: >60 mL/min (ref >60)
GFR calc non Af Amer: >60 mL/min (ref >60)
GLUCOSE: 96 mg/dL (ref 65–99)
Potassium: 4.2 mmol/L (ref 3.5–5.1)
Sodium: 138 mmol/L (ref 135–145)

## 2015-03-09 ENCOUNTER — Non-Acute Institutional Stay (SKILLED_NURSING_FACILITY): Payer: Medicaid Other | Admitting: Internal Medicine

## 2015-03-09 DIAGNOSIS — R609 Edema, unspecified: Secondary | ICD-10-CM

## 2015-03-09 DIAGNOSIS — J42 Unspecified chronic bronchitis: Secondary | ICD-10-CM

## 2015-03-12 ENCOUNTER — Encounter (HOSPITAL_COMMUNITY)
Admission: RE | Admit: 2015-03-12 | Discharge: 2015-03-12 | Disposition: A | Discharge status: Home / Self Care | Payer: Medicaid Other | Source: Skilled Nursing Facility | Attending: Internal Medicine | Admitting: Internal Medicine

## 2015-03-12 DIAGNOSIS — I10 Essential (primary) hypertension: Secondary | ICD-10-CM | POA: Diagnosis not present

## 2015-03-12 LAB — BASIC METABOLIC PANEL
ANION GAP: 9 (ref 5–15)
BUN: 12 mg/dL (ref 6–20)
CALCIUM: 8.2 mg/dL — AB (ref 8.9–10.3)
CHLORIDE: 85 mmol/L — AB (ref 101–111)
CO2: 41 mmol/L — AB (ref 22–32)
Creatinine, Ser: 1.33 mg/dL — ABNORMAL HIGH (ref 0.61–1.24)
GFR calc non Af Amer: 57 mL/min — ABNORMAL LOW (ref >60)
GLUCOSE: 177 mg/dL — AB (ref 65–99)
POTASSIUM: 3.5 mmol/L (ref 3.5–5.1)
Sodium: 135 mmol/L (ref 135–145)

## 2015-03-14 DIAGNOSIS — I10 Essential (primary) hypertension: Secondary | ICD-10-CM | POA: Diagnosis not present

## 2015-03-14 LAB — BASIC METABOLIC PANEL
ANION GAP: 9 (ref 5–15)
BUN: 13 mg/dL (ref 6–20)
CALCIUM: 9 mg/dL (ref 8.9–10.3)
CO2: 38 mmol/L — AB (ref 22–32)
Chloride: 92 mmol/L — ABNORMAL LOW (ref 101–111)
Creatinine, Ser: 1.2 mg/dL (ref 0.61–1.24)
GFR calc non Af Amer: >60 mL/min (ref >60)
Glucose, Bld: 114 mg/dL — ABNORMAL HIGH (ref 65–99)
POTASSIUM: 4.3 mmol/L (ref 3.5–5.1)
Sodium: 139 mmol/L (ref 135–145)

## 2015-03-20 ENCOUNTER — Encounter: Payer: Self-pay | Admitting: Internal Medicine

## 2015-03-20 ENCOUNTER — Other Ambulatory Visit: Payer: Self-pay | Admitting: *Deleted

## 2015-03-20 ENCOUNTER — Non-Acute Institutional Stay (SKILLED_NURSING_FACILITY): Payer: Medicaid Other | Admitting: Internal Medicine

## 2015-03-20 DIAGNOSIS — I10 Essential (primary) hypertension: Secondary | ICD-10-CM | POA: Diagnosis not present

## 2015-03-20 DIAGNOSIS — R609 Edema, unspecified: Secondary | ICD-10-CM | POA: Diagnosis not present

## 2015-03-20 DIAGNOSIS — I5032 Chronic diastolic (congestive) heart failure: Secondary | ICD-10-CM

## 2015-03-20 DIAGNOSIS — N289 Disorder of kidney and ureter, unspecified: Secondary | ICD-10-CM

## 2015-03-20 MED ORDER — OXYCODONE HCL 15 MG PO TABS: ORAL_TABLET | ORAL | Status: DC

## 2015-03-20 NOTE — Progress Notes (Signed)
Patient ID: Edwin [name], male   DOB: 28-Apr-1956, 59 y.o.   MRN: [id number]               This is an acute visit.  Level of care skilled.  Facility [facility].   Chief complaint  Acute visit follow-up hypertension--edema-COPD-renal insufficiency   .  History of present illness .  Patient is a pleasant 59 year old male with the above diagnoses he has been actually quite stable -he came here after undergoing  a right below the knee amputation secondary to infected right foot-this appears to have healed fairly unremarkable    H .  We have also been following his blood pressure--this had been in issue previously we increased his lisinopril to 40 mg a day-also subsequently started clonidine 0.1 mg twice a day is also on Lopressor 12.5 mg twice a day this blood pressures continue to be somewhat variable but still somewhat elevated I got 150/84 on exam this afternoon-I see a.m. readings ranging from 761-95/03/3266 systolically I see one reading of 96/78 but this is quite unusual and I suspect was taken by machine.  Leg on the day he also has somewhat elevated readings at times it appears ranging more from the 1:24-580 area systolic appears to have reached more in the 150s.  He states previously been on clonidine 0.2 mg twice a day and he says this was quite effective in addition to the lisinopril and beta blocker.  She does not complain of any headache or dizziness or chest pain.  He does have a history COPD we recently added Advair he says this is helping-with his breathing-he is on nebulizers as well as needed.  - Patient also has history of diastolic CHF with some baseline lower extremity edema he was concerned about edema  increasing at times-a venous Doppler was negative for DVT in his calf-we did increase his Lasix to 40 mg a day and actually added 20 mg every afternoon as well secondary to his concerns of edema however his CO2 level went up to over 40 in we have cut back to Lasix  40 mg a day this appears of had a beneficial effect on his CO2 level-he also has renal insufficiency but this has been stable as well with most recent creatinine 1.2 BUN of 13 back on August 17 His edema appears to be relatively baseline today.    Family medical social history has been reviewed including admission note on 09/13/2013 .  Medications have been reviewed per [facility] These include Tylenol.  Albuterol nebulizers every 6 hours when necessary.  Clonidine 0.1 mg twice a day.  Colchicine 0.6 mg daily.  Lasix 40 mg daily.  Lisinopril 40 mg daily.  Oxycodone 15 mg every 4 hours when necessary pain.  Potassium 30 mEq every morning.  Protonix 40 mg daily.  Enteric-coated aspirin 81 mg daily.  Ambien 10 mg daily at bedtime.  Plavix 75 mg daily .  Review of systems  In general no complaints of fever or chills.  Skin does not complaining a rash or itching .- Head ears eyes nose mouth and throat-does not complain of visual changes.  Respiratory no complaints shortness breath or cough--believes Advair is helping.  Cardiac no chest pain -- some baseline left foot edema  GI does not complaining of any abdominal pain nausea or vomiting.  Muscle skeletal -has chronic shoulder pain right greater than left-as noted above but does not really complain of that today.  Neurologic does not complaining of dizziness headache  or syncopal type episodes.  Psych no complaint of depression no anxiety apparently depression had been an issue in the past--d .  Physical exam    Is afebrile pulse 84 respirations 18 blood pressure taken manually 150/84     In general this is a pleasant middle-age male in no distress sitting comfortably in his wheelchair .  His skin is warm and dry   Eyes sclera and conjunctiva are clear visual acuity appears intact pupils are reactive and equal bilaterally.  Chest has somewhat shallow air entry with baselie bronchial sounds  .  Heart --  regular rate and  rhythm -- he has some minimal edema of his left foot and some calf edema--what all this appears relatively baseline .      Muscle skeletal does have right below the knee amputation- -I moves his extremities at baseline I do not note any deformities  Continues to ambulate independently with his wheelchair.  Marland Kitchen   Neurologicis grossly intact no lateralizing findings-cranial nerves  intact no lateralizing findings   Psych he is alert and oriented pleasant and appropriate.   Labs  03/14/2015.  Sodium 139 potassium 4.3 CO2 38 BUN 13 creatinine 1.2.  03/12/2015.  Sodium 135 potassium 3.5 CO2 42 BUN 12 creatinine 1.33  01/17/2015.  Sodium 138 potassium 3.9 CO2 34 BUN 18 creatinine 1.39.  WBC 5.7 hemoglobin 17.1 platelets 177.    11/24/2014.  Sodium 137 potassium 3.9 BUN 26 creatinine 1.24 CO2 34.  11/17/2014.  Hemoglobin A1c 5.6.  11/10/2014.  Liver function tests within normal limits.  WBC 6.7 hemoglobin 16.1 platelets 162.  Uric acid 10.3  11/10/2014.  Sodium 136 potassium 3.6 BUN 27 creatinine 1.56 CO2 33-of note glucose 141.  WBC 6.7 hemoglobin 16.1 platelets 162.  Uric acid level X.3.  08/05/2014.  BNP 218.6.    05/01/2014.  Cholesterol 146-triglycerides 208-HDL 29-LDL 95.  Sodium 141 potassium 4.5 BUN 25 creatinine 1.26.  Liver function tests within normal limits except albumin of 3.3.  03/13/2014-BNP 634.  TSH 3.843.    03/29/2014.  Sodium 139 potassium 4.5 BUN 27 creatinine 1.23.  WBC 5.7 hemoglobin 13.0 platelets 202.  03/13/2014.  [id number]  -TSH-3.843  01/20/2014.  WBC 4.9 hemoglobin 9.7 platelets 236.  Marland Kitchen  01/09/2014.  The WBC 5.0 hemoglobin 9.3 platelets 243.  Sodium 139 potassium 4.4 BUN 40 creatinine 1.3.  11/30/2013.  BNP 903.8  Uric acid-6.6  March l 2015.  Liver function tests showed an albumin of 1.6-   Assessment and plan .  #2-[id number] systolics appear to be still somewhat elevated despite  initiation of clonidine- Lopressor as well as increasing the dose of lisinopril-patient feels clonidine at 0.2 twice a day has been quite effective in the past-will cautiously increase this and have blood pressures monitored twice a day  . #2 diastolic CHF--edema-- this appears to be relatively stable he is on Lasix this is complicated with a history of renal insufficiency however recent BUN and creatinine of 1.2-BUN of 13 appears actually to be quite stable for him.--His elevated CO2 level is nearing his baseline again in the mid 130s will recheck this  #3-renal insufficiency again as stated above this appears to be stable  #4 COPD this appears to be stable he says the Advair has helped.  [id number]     .

## 2015-03-20 NOTE — Telephone Encounter (Signed)
[facility] 

## 2015-03-21 ENCOUNTER — Encounter (HOSPITAL_COMMUNITY)
Admission: AD | Admit: 2015-03-21 | Discharge: 2015-03-21 | Disposition: A | Discharge status: Home / Self Care | Payer: Medicaid Other | Source: Skilled Nursing Facility | Attending: Internal Medicine | Admitting: Internal Medicine

## 2015-03-21 DIAGNOSIS — I10 Essential (primary) hypertension: Secondary | ICD-10-CM | POA: Diagnosis not present

## 2015-03-21 LAB — BASIC METABOLIC PANEL
Anion gap: 8 (ref 5–15)
BUN: 20 mg/dL (ref 6–20)
CO2: 34 mmol/L — ABNORMAL HIGH (ref 22–32)
Calcium: 8.7 mg/dL — ABNORMAL LOW (ref 8.9–10.3)
Chloride: 98 mmol/L — ABNORMAL LOW (ref 101–111)
Creatinine, Ser: 1.06 mg/dL (ref 0.61–1.24)
GFR calc Af Amer: >60 mL/min (ref >60)
GFR calc non Af Amer: >60 mL/min (ref >60)
Glucose, Bld: 88 mg/dL (ref 65–99)
Potassium: 4.4 mmol/L (ref 3.5–5.1)
Sodium: 140 mmol/L (ref 135–145)

## 2015-03-29 ENCOUNTER — Ambulatory Visit: Payer: Self-pay | Admitting: Vascular Surgery

## 2015-03-29 ENCOUNTER — Encounter (HOSPITAL_COMMUNITY): Payer: Self-pay

## 2015-03-31 NOTE — Progress Notes (Signed)
Patient ID: Edwin [name], male   DOB: 07-06-1956, 59 y.o.   MRN: [id number]               This is an acute visit.  Level of care skilled.  Facility [facility].   Chief complaint  Acute visit follow-up renal insufficiency-edema--COPD   .  History of present illness .  Patient is a very pleasant 59 year old male --he was noted to possibly have some increased edema of his left leg and venous Doppler as been ordered which has come back negative per chart review it does show soft tissues edema in the calf.  His Lasix was increased recently to 40 mg every morning 20 mg every afternoon-updated metabolic panel shows stability today with a BUN of 19 creatinine of 1.25 his CO2 level is 36-baseline appears to be in the mid 30s.  We'll have to keep an eye on this with the increased dose of Lasix.  He does have a history of COPD-he would like his Advair restarted apparently been on this some time ago and said it does help his breathing.      He does have a history of suspected diastolic CHF he is on Lasix again also on an Ace inhibitor as well          Family medical social history has been reviewed including admission note on 09/13/2013 .  Medications have been reviewed per [facility] These include Tylenol.  Albuterol nebulizers every 6 hours when necessary.  Clonidine 0.1 mg twice a day.  Colchicine 0.6 mg daily.  Lasix 40 mg daily.  Lisinopril 40 mg every morning and 20 mg every PM.  Oxycodone 15 mg every 4 hours when necessary pain.  Potassium 30 mEq every morning.  Protonix 40 mg daily.  Enteric-coated aspirin 81 mg daily.  Ambien 10 mg daily at bedtime.  Plavix 75 mg daily .  Review of systems  In general no complaints of fever or chills.  Skin does not complaining a rash or itching .- Head ears eyes nose mouth and throat-does not complain of visual changes.  Respiratory no complaints shortness breath or cough.  Cardiac no chest pain --  has some increased  left leg calf edema GI does not complaining of any abdominal pain nausea or vomiting.  Muscle skeletal -has chronic shoulder pain right greater than left-but does not really complain of that today.  Neurologic does not complaining of dizziness headache or syncopal type episodes.  Psych no complaint of depression no anxiety apparently depression had been an issue in the past--d .  Physical exam  He is afebrile pulse of 80 respirations 17 --O2 saturation is in the 90s on room air   In general this is a pleasant middle-age male in no distress sitting comfortably in his wheelchair .  His skin is warm and dry   Eyes sclera and conjunctiva are clear visual acuity appears intact pupils are reactive and equal bilaterally.  Chest has somewhat shallow air entry with scattered rhonchi which is not new  .  Heart --  regular rate and rhythm -- has some mildly increased edema of his left lower leg-this is nonerythematous nontender appears actually slightly   Edema from earlier this week .      Muscle skeletal does have right below the knee amputation- -I moves his extremities at baseline I do not note any deformities  Continues to ambulate independently with his wheelchair.  Marland Kitchen   Neurologicis grossly intact no lateralizing findings-cranial nerves  intact  no lateralizing findings   Psych he is alert and oriented pleasant and appropriate.   Labs  03/09/2015.  Sodium 138 potassium 4.2 BUN 19 creatinine 1.25 CO2 level XXXVI 02/15/2015.  Sodium 138 potassium 4.2 BUN 16 creatinine 1.2.     01/17/2015.  Sodium 138 potassium 3.9 CO2 34 BUN 18 creatinine 1.39.  WBC 5.7 hemoglobin 17.1 platelets 177.    11/24/2014.  Sodium 137 potassium 3.9 BUN 26 creatinine 1.24 CO2 34.  11/17/2014.  Hemoglobin A1c 5.6.  11/10/2014.  Liver function tests within normal limits.  WBC 6.7 hemoglobin 16.1 platelets 162.  Uric acid 10.3  11/10/2014.  Sodium 136 potassium 3.6 BUN 27  creatinine 1.56 CO2 33-of note glucose 141.  WBC 6.7 hemoglobin 16.1 platelets 162.  Uric acid level X.3.  08/05/2014.  BNP 218.6.    05/01/2014.  Cholesterol 146-triglycerides 208-HDL 29-LDL 95.  Sodium 141 potassium 4.5 BUN 25 creatinine 1.26.  Liver function tests within normal limits except albumin of 3.3.  03/13/2014-BNP 634.  TSH 3.843.    03/29/2014.  Sodium 139 potassium 4.5 BUN 27 creatinine 1.23.  WBC 5.7 hemoglobin 13.0 platelets 202.  03/13/2014.  [id number]  -TSH-3.843  01/20/2014.  WBC 4.9 hemoglobin 9.7 platelets 236.  Marland Kitchen  01/09/2014.  The WBC 5.0 hemoglobin 9.3 platelets 243.  Sodium 139 potassium 4.4 BUN 40 creatinine 1.3.  11/30/2013.  BNP 903.8  Uric acid-6.6  March l 2015.  Liver function tests showed an albumin of 1.6-   Assessment and plan .  Edema-this appears to be stable to slightly improved on current Lasix dose-will haveto keep an eye  on this with his history of COPD and mildly elevated CO2 level with history of renal insufficiency-this appears to be stable per labs quoted above we will update this on Monday, August 15  COPD-will start Advair per patient request he has tolerated this well the past and said it has helped with his breathing there is no sign of distress --however he does continue to smoke     [id number]     .

## 2015-04-11 ENCOUNTER — Non-Acute Institutional Stay (SKILLED_NURSING_FACILITY): Payer: Medicaid Other | Admitting: Internal Medicine

## 2015-04-11 DIAGNOSIS — N289 Disorder of kidney and ureter, unspecified: Secondary | ICD-10-CM | POA: Diagnosis not present

## 2015-04-11 DIAGNOSIS — I10 Essential (primary) hypertension: Secondary | ICD-10-CM | POA: Diagnosis not present

## 2015-04-11 DIAGNOSIS — I5032 Chronic diastolic (congestive) heart failure: Secondary | ICD-10-CM

## 2015-04-11 DIAGNOSIS — R609 Edema, unspecified: Secondary | ICD-10-CM | POA: Diagnosis not present

## 2015-04-11 NOTE — Progress Notes (Signed)
Patient ID: Edwin [name], male   DOB: December 31, 1955, 59 y.o.   MRN: [id number]                This is an acute visit.  Level of care skilled.  Facility [facility].   Chief complaint  Acute visit follow-up edema   .  History of present illness .     Patient is a pleasant 59 year old male who  has history of diastolic CHF with some baseline lower extremity edema he was concerned about edema  increasing at times-a venous Doppler was negative for DVT in his calf-we did increase his Lasix to 40 mg a day and actually added 20 mg every afternoon as well secondary to his concerns of edema however his CO2 level went up to over 40 in we have cut back to Lasix 40 mg a day this appears of had a beneficial effect on his CO2 level-he also has renal insufficiency but this has been stable as well with most recent creatinine 1.06 with a BUN 20 is CO2 level has come down to 34.   patient feels he's having increased edema however and would like his Lasix increased-appears his weight have been stable relatively-he does not complain of any increased shortness of breath he does have a history of COPD      Family medical social history has been reviewed including admission note on 09/13/2013 .  Medications have been reviewed per [facility] These include Tylenol.  Albuterol nebulizers every 6 hours when necessary.  Clonidine 0.1 mg twice a day.  Colchicine 0.6 mg daily.  Lasix 40 mg daily.  Lisinopril 40 mg daily.  Oxycodone 15 mg every 4 hours when necessary pain.  Potassium 30 mEq every morning.  Protonix 40 mg daily.  Enteric-coated aspirin 81 mg daily.  Ambien 10 mg daily at bedtime.  Plavix 75 mg daily .  Review of systems  In general no complaints of fever or chills.  Skin does not complaining a rash or itching .- Head ears eyes nose mouth and throat-does not complain of visual changes.  Respiratory no complaints shortness breath or cough--believes Advair is helping.  Cardiac no  chest pain -- appears to have some increased lower left leg edema GI does not complaining of any abdominal pain nausea or vomiting.  Muscle skeletal -has chronic shoulder pain right greater than left but does not really complain of that today.  Neurologic does not complaining of dizziness headache or syncopal type episodes.  Psych no complaint of depression no anxiety apparently depression had been an issue in the past--d .  Physical exam    Temperature 98.0 pulse 70 respirations 16 blood pressure 120/80 weight is 202.4     In general this is a pleasant middle-age male in no distress sitting comfortably in his wheelchair .  His skin is warm and dry   Eyes sclera and conjunctiva are clear visual acuity appears intact pupils are reactive and equal bilaterally.  Chest has somewhat shallow air entry with baselie bronchial sounds  .  Heart --  regular rate and rhythm -he does have some left lower extremity edema --this appears to be somewhat increased from his baseline I would say one plus-this is cool to touch is not tender   .      Muscle skeletal does have right below the knee amputation- -I moves his extremities at baseline I do not note any deformities  Continues to ambulate independently with his wheelchair.  Marland Kitchen   Neurologicis grossly  intact no lateralizing findings-cranial nerves  intact no lateralizing findings   Psych he is alert and oriented pleasant and appropriate.   Labs  03/21/2015.  Sodium 140 potassium 4.4 CO2 34 BUN 20 creatinine 1.06.    03/14/2015.  Sodium 139 potassium 4.3 CO2 38 BUN 13 creatinine 1.2.  03/12/2015.  Sodium 135 potassium 3.5 CO2 42 BUN 12 creatinine 1.33  01/17/2015.  Sodium 138 potassium 3.9 CO2 34 BUN 18 creatinine 1.39.  WBC 5.7 hemoglobin 17.1 platelets 177.    11/24/2014.  Sodium 137 potassium 3.9 BUN 26 creatinine 1.24 CO2 34.  11/17/2014.  Hemoglobin A1c 5.6.  11/10/2014.  Liver function tests within normal  limits.  WBC 6.7 hemoglobin 16.1 platelets 162.  Uric acid 10.3  11/10/2014.  Sodium 136 potassium 3.6 BUN 27 creatinine 1.56 CO2 33-of note glucose 141.  WBC 6.7 hemoglobin 16.1 platelets 162.  Uric acid level X.3.  08/05/2014.  BNP 218.6.    05/01/2014.  Cholesterol 146-triglycerides 208-HDL 29-LDL 95.  Sodium 141 potassium 4.5 BUN 25 creatinine 1.26.  Liver function tests within normal limits except albumin of 3.3.  03/13/2014-BNP 634.  TSH 3.843.    03/29/2014.  Sodium 139 potassium 4.5 BUN 27 creatinine 1.23.  WBC 5.7 hemoglobin 13.0 platelets 202.   -   Assessment and plan .  #   . #Edema with history of diastolic CHF--edema--  he is on Lasix 40 mg a day with 30 meq potassium supplementation this is complicated with a history of renal insufficiency however recent BUN and creatinine of 1.06-BUN of 20 appears actually to be quite stable for him.--His elevated CO2 level is now at his baseline at 34-he does have some increased edema that appears he is quite concerned about this will give him an extra dose of Lasix 40 mg tonight-recheck a basic metabolic panel tomorrow-also have patient weighed to tomorrow notify provider of results  #2-renal insufficiency again as stated above this appears to be stable  #3 COPD this appears to be stable he says the Advair continues to help  #4-hypertension this appears to be stabilized clonidine was recently increased up to 0.2 mg twice a day is also on lisinopril  [id number]     .

## 2015-04-12 ENCOUNTER — Encounter (HOSPITAL_COMMUNITY)
Admission: AD | Admit: 2015-04-12 | Discharge: 2015-04-12 | Disposition: A | Discharge status: Home / Self Care | Payer: Medicaid Other | Source: Skilled Nursing Facility | Attending: Internal Medicine | Admitting: Internal Medicine

## 2015-04-12 DIAGNOSIS — I10 Essential (primary) hypertension: Secondary | ICD-10-CM | POA: Insufficient documentation

## 2015-04-12 DIAGNOSIS — I482 Chronic atrial fibrillation: Secondary | ICD-10-CM | POA: Insufficient documentation

## 2015-04-12 LAB — BASIC METABOLIC PANEL
Anion gap: 9 (ref 5–15)
BUN: 26 mg/dL — AB (ref 6–20)
CHLORIDE: 94 mmol/L — AB (ref 101–111)
CO2: 34 mmol/L — ABNORMAL HIGH (ref 22–32)
Calcium: 8.8 mg/dL — ABNORMAL LOW (ref 8.9–10.3)
Creatinine, Ser: 1.34 mg/dL — ABNORMAL HIGH (ref 0.61–1.24)
GFR calc Af Amer: >60 mL/min (ref >60)
GFR, EST NON AFRICAN AMERICAN: 57 mL/min — AB (ref >60)
GLUCOSE: 85 mg/dL (ref 65–99)
POTASSIUM: 3.9 mmol/L (ref 3.5–5.1)
Sodium: 137 mmol/L (ref 135–145)

## 2015-04-13 ENCOUNTER — Other Ambulatory Visit (HOSPITAL_COMMUNITY)
Admission: RE | Admit: 2015-04-13 | Discharge: 2015-04-13 | Disposition: A | Discharge status: Home / Self Care | Payer: Medicaid Other | Source: Skilled Nursing Facility | Attending: Internal Medicine | Admitting: Internal Medicine

## 2015-04-13 ENCOUNTER — Non-Acute Institutional Stay (SKILLED_NURSING_FACILITY): Payer: Medicaid Other | Admitting: Internal Medicine

## 2015-04-13 DIAGNOSIS — L03119 Cellulitis of unspecified part of limb: Secondary | ICD-10-CM | POA: Diagnosis not present

## 2015-04-13 DIAGNOSIS — A499 Bacterial infection, unspecified: Secondary | ICD-10-CM | POA: Insufficient documentation

## 2015-04-13 DIAGNOSIS — R609 Edema, unspecified: Secondary | ICD-10-CM | POA: Diagnosis not present

## 2015-04-13 DIAGNOSIS — N289 Disorder of kidney and ureter, unspecified: Secondary | ICD-10-CM | POA: Diagnosis not present

## 2015-04-13 DIAGNOSIS — S80822A Blister (nonthermal), left lower leg, initial encounter: Secondary | ICD-10-CM | POA: Insufficient documentation

## 2015-04-13 NOTE — Progress Notes (Signed)
Patient ID: Edwin [name], male   DOB: [birth year], 59 y.o.   MRN: [id number]                 This is an acute visit.  Level of care skilled.  Facility [facility].   Chief complaint  Acute visit secondary to concerns of left lower extremity lesion-venous stasis-cellulitis   .  History of present illness .     Patient is a pleasant 59 year old male who  has history of diastolic CHF with some baseline lower extremity edema he was concerned about edema  increasing at times-a venous Doppler was negative for DVT in his calf- Lately is had some increased edema he is now on Lasix 40 mg twice a day this is complicated with a history of renal insufficiency with at times elevated CO2 level however this is been relatively stable recently.  I do note his creatinine is 1.34 as of yesterday this is somewhat higher than was on the previous lab although still relatively at his baseline.  Patient has developed on his left shin a blister type lesion there is some tenderness here and concerns for possible cellulitis.  He has been afebrile vital signs remained stable       Family medical social history has been reviewed including admission note on 09/13/2013 .  Medications have been reviewed per [facility] These include Tylenol.  Albuterol nebulizers every 6 hours when necessary.  Clonidine 0.1 mg twice a day.  Colchicine 0.6 mg daily.  Lasix 40 mg BID  Lisinopril 40 mg daily.  Oxycodone 15 mg every 4 hours when necessary pain.  Potassium 30 mEq every morning.  Protonix 40 mg daily.  Enteric-coated aspirin 81 mg daily.  Ambien 10 mg daily at bedtime.  Plavix 75 mg daily .  Review of systems  In general no complaints of fever or chills.  Skin does not complaining a rash or itching -- does have a blister type lesion on his left shin.- Head ears eyes nose mouth and throat-does not complain of visual changes.  Respiratory no complaints shortness breath or cough--.  Cardiac no  chest pain -- appears to have some increased lower left leg edema appears to have moderated somewhat GI does not complaining of any abdominal pain nausea or vomiting.  Muscle skeletal -has chronic shoulder pain right greater than left but does not really complain of that today.  Neurologic does not complaining of dizziness headache or syncopal type episodes.  Psych no complaint of depression no anxiety apparently depression had been an issue in the past--d .  Physical exam   Temperature 97.9 pulse 62 respirations 19 blood pressure 129/73 O2 saturation is in the 90s on room air weight is 201.4 this is relatively stable     In general this is a pleasant middle-age male in no distress sitting comfortably in his wheelchair .  His skin is warm and dry -on left shin there is a blister type lesion there is some tenderness to palpation around this area and a small amount of cream-colored drainage-there is a small amount of erythema extending from the blister as well  Eyes sclera and conjunctiva are clear visual acuity appears intact pupils are reactive and equal bilaterally.  Chest has somewhat shallow air entry with baselie bronchial sounds  .  Heart --  regular rate and rhythm -he does have some left lower extremity edema  This appears somewhat improved from previous exam   .      Muscle skeletal does  have right below the knee amputation- -I moves his extremities at baseline I do not note any deformities  Continues to ambulate independently with his wheelchair.  Marland Kitchen   Neurologicis grossly intact no lateralizing findings-cranial nerves  intact no lateralizing findings   Psych he is alert and oriented pleasant and appropriate.   Labs  04/12/2015.  Sodium 137 potassium 3.9 CO2 34 BUN 26 creatinine 1.34  03/21/2015.  Sodium 140 potassium 4.4 CO2 34 BUN 20 creatinine 1.06.    03/14/2015.  Sodium 139 potassium 4.3 CO2 38 BUN 13 creatinine 1.2.  03/12/2015.  Sodium 135  potassium 3.5 CO2 42 BUN 12 creatinine 1.33  01/17/2015.  Sodium 138 potassium 3.9 CO2 34 BUN 18 creatinine 1.39.  WBC 5.7 hemoglobin 17.1 platelets 177.    11/24/2014.  Sodium 137 potassium 3.9 BUN 26 creatinine 1.24 CO2 34.  11/17/2014.  Hemoglobin A1c 5.6.  11/10/2014.  Liver function tests within normal limits.  WBC 6.7 hemoglobin 16.1 platelets 162.  Uric acid 10.3  11/10/2014.  Sodium 136 potassium 3.6 BUN 27 creatinine 1.56 CO2 33-of note glucose 141.  WBC 6.7 hemoglobin 16.1 platelets 162.  Uric acid level X.3.  08/05/2014.  BNP 218.6.        Assessment and plan    #1 concerns for cellulitis possibly venous stasis related-will treat this aggressively with doxycycline 100 mg twice a day for 7 days-culture any drainage-also will update a CBC with differential to look for any elevated white count   .  #   2--#Edema with history of diastolic CHF--edema--  This appears to be somewhat improved he is now on Lasix 40 mg a day with potassium supplementation-this is complicated with a history of renal insufficiency creatinine has gone up a bit not grossly changed from his baseline at 1.34 at this point patient is quite adamant about continuing his increased dose of Lasix so will continue at this .40  mg twice a day Will increase his potassium slightly to 40 mEq daily he will need an updated metabolic panel tomorrow we will have to keep a close eye on this  #3-renal insufficiency again as stated above as well have to be watched closely     [id number]  Addendum-will switch his doxycycline to Bactrim DS twice a day secondary to concerns that doxycycline has photosensitivity side effects and patient does like to go outside     .

## 2015-04-14 ENCOUNTER — Other Ambulatory Visit (HOSPITAL_COMMUNITY)
Admission: RE | Admit: 2015-04-14 | Discharge: 2015-04-14 | Disposition: A | Discharge status: Home / Self Care | Payer: Medicaid Other | Source: Skilled Nursing Facility | Attending: Internal Medicine | Admitting: Internal Medicine

## 2015-04-14 ENCOUNTER — Non-Acute Institutional Stay (SKILLED_NURSING_FACILITY): Payer: Medicaid Other | Admitting: Internal Medicine

## 2015-04-14 DIAGNOSIS — I83224 Varicose veins of left lower extremity with both ulcer of heel and midfoot and inflammation: Secondary | ICD-10-CM

## 2015-04-14 DIAGNOSIS — R609 Edema, unspecified: Secondary | ICD-10-CM

## 2015-04-14 DIAGNOSIS — N289 Disorder of kidney and ureter, unspecified: Secondary | ICD-10-CM | POA: Diagnosis not present

## 2015-04-14 DIAGNOSIS — I83222 Varicose veins of left lower extremity with both ulcer of calf and inflammation: Secondary | ICD-10-CM | POA: Diagnosis not present

## 2015-04-14 DIAGNOSIS — I10 Essential (primary) hypertension: Secondary | ICD-10-CM | POA: Insufficient documentation

## 2015-04-14 DIAGNOSIS — I83228 Varicose veins of left lower extremity with both ulcer of other part of lower extremity and inflammation: Secondary | ICD-10-CM | POA: Diagnosis not present

## 2015-04-14 DIAGNOSIS — I83225 Varicose veins of left lower extremity with both ulcer other part of foot and inflammation: Secondary | ICD-10-CM | POA: Diagnosis not present

## 2015-04-14 DIAGNOSIS — I872 Venous insufficiency (chronic) (peripheral): Secondary | ICD-10-CM

## 2015-04-14 DIAGNOSIS — L97929 Non-pressure chronic ulcer of unspecified part of left lower leg with unspecified severity: Secondary | ICD-10-CM

## 2015-04-14 DIAGNOSIS — I83221 Varicose veins of left lower extremity with both ulcer of thigh and inflammation: Secondary | ICD-10-CM | POA: Diagnosis not present

## 2015-04-14 DIAGNOSIS — I83229 Varicose veins of left lower extremity with both ulcer of unspecified site and inflammation: Secondary | ICD-10-CM

## 2015-04-14 DIAGNOSIS — Z720 Tobacco use: Secondary | ICD-10-CM | POA: Insufficient documentation

## 2015-04-14 DIAGNOSIS — I83223 Varicose veins of left lower extremity with both ulcer of ankle and inflammation: Secondary | ICD-10-CM | POA: Diagnosis not present

## 2015-04-14 LAB — BASIC METABOLIC PANEL
Anion gap: 8 (ref 5–15)
BUN: 30 mg/dL — AB (ref 6–20)
CALCIUM: 8.6 mg/dL — AB (ref 8.9–10.3)
CO2: 32 mmol/L (ref 22–32)
CREATININE: 1.59 mg/dL — AB (ref 0.61–1.24)
Chloride: 95 mmol/L — ABNORMAL LOW (ref 101–111)
GFR, EST AFRICAN AMERICAN: 54 mL/min — AB (ref >60)
GFR, EST NON AFRICAN AMERICAN: 46 mL/min — AB (ref >60)
Glucose, Bld: 127 mg/dL — ABNORMAL HIGH (ref 65–99)
Potassium: 3.6 mmol/L (ref 3.5–5.1)
SODIUM: 135 mmol/L (ref 135–145)

## 2015-04-14 LAB — CBC WITH DIFFERENTIAL/PLATELET
BASOS ABS: 0 10*3/uL (ref 0.0–0.1)
BASOS PCT: 1 %
EOS ABS: 0.2 10*3/uL (ref 0.0–0.7)
EOS PCT: 3 %
HCT: 50.3 % (ref 39.0–52.0)
Hemoglobin: 16.1 g/dL (ref 13.0–17.0)
LYMPHS ABS: 1.7 10*3/uL (ref 0.7–4.0)
Lymphocytes Relative: 24 %
MCH: 26.4 pg (ref 26.0–34.0)
MCHC: 32 g/dL (ref 30.0–36.0)
MCV: 82.6 fL (ref 78.0–100.0)
Monocytes Absolute: 0.6 10*3/uL (ref 0.1–1.0)
Monocytes Relative: 8 %
NEUTROS PCT: 64 %
Neutro Abs: 4.3 10*3/uL (ref 1.7–7.7)
PLATELETS: 137 10*3/uL — AB (ref 150–400)
RBC: 6.09 MIL/uL — AB (ref 4.22–5.81)
RDW: 20.3 % — ABNORMAL HIGH (ref 11.5–15.5)
WBC: 6.8 10*3/uL (ref 4.0–10.5)

## 2015-04-14 NOTE — Progress Notes (Signed)
Patient ID: Edwin [name], male   DOB: 1956-04-15, 59 y.o.   MRN: [id number] Facility; [place] Chief complaint left lower extremity edema History; this is a patient who has a history of peripheral vascular disease. His been seen several times by our service with regards to increasing edema in his remaining left lower leg. He has had a previous right above-knee amputation. Was noted yesterday to have a small open area on the anterior aspect of the left leg with some purulence he has been started on Septra. He had been on Lasix 40 mg in the morning and 20 mg at night. The patient has been resistant to the notion of reducing his Lasix even though his creatinine seems to of gone from 1.2 month ago today is up to 1.59 his BUN is also up to 30. In spite of this his edema is still really tense in the left lower leg. He had a venous duplex ultrasound of the left leg in August that was negative for DVT but did show soft tissue edema. This was not a reflux study.   BMP Latest Ref Rng 04/14/2015 04/12/2015 03/21/2015  Glucose 65 - 99 mg/dL 127(H) 85 88  BUN 6 - 20 mg/dL 30(H) 26(H) 20  Creatinine 0.61 - 1.24 mg/dL 1.59(H) 1.34(H) 1.06  Sodium 135 - 145 mmol/L 135 137 140  Potassium 3.5 - 5.1 mmol/L 3.6 3.9 4.4  Chloride 101 - 111 mmol/L 95(L) 94(L) 98(L)  CO2 22 - 32 mmol/L 32 34(H) 34(H)  Calcium 8.9 - 10.3 mg/dL 8.6(L) 8.8(L) 8.7(L)     Past Medical History  Diagnosis Date  . ERECTILE DYSFUNCTION 11/27/2007    Qualifier: Diagnosis of  By: Garen Grams    . HYPERLIPIDEMIA 11/27/2007    Qualifier: Diagnosis of  By: Garen Grams    . HYPERTENSION, BENIGN 05/23/2009    Qualifier: Diagnosis of  By: Melvyn Novas MD, Christena Deem   . Coronary artery disease     Cardiac catheterization in 2008 showed 99% stenosis in proximal left circumflex which was treated with Taxus drug-eluting stent. There was mild RCA and LAD disease with normal ejection fraction.  . Tobacco use   . CHF (congestive heart  failure)   . Hypertension   . Asthma   . Traumatic amputation of leg(s) (complete) (partial), unilateral, at or above knee, without mention of complication   . Pressure ulcer, lower back(707.03)   . Other severe protein-calorie malnutrition   . Anemia, unspecified   . Atrial fibrillation   . Gastric ulcer, unspecified as acute or chronic, without mention of hemorrhage, perforation, or obstruction   . PAD (peripheral artery disease)     Previous right SFA stent in 2003. Directional atherectomy right SFA in 01/2013  . Carotid artery occlusion   . Heart murmur   . COPD 11/27/2007    Qualifier: Diagnosis of  By: Garen Grams    . Pneumonia 2000  . Arthritis     "shoulders" (09/22/2014)   Past Surgical History  Procedure Laterality Date  . Nasal sinus surgery  2004  . Acne cyst removal    . Femoral artery stent Right 2003    [name] 09/22/2014  . Endarterectomy Left 08/16/2013    Procedure: Exploration of Left Neck/Fix Bleeding;  Surgeon: Elam Dutch, MD;  Location: [facility] OR;  Service: Vascular;  Laterality: Left;  . Esophagogastroduodenoscopy N/A 09/08/2013    Procedure: ESOPHAGOGASTRODUODENOSCOPY (EGD);  Surgeon: Cleotis Nipper, MD;  Location: [facility] ENDOSCOPY;  Service: Endoscopy;  Laterality:  N/A;  . Flexible sigmoidoscopy N/A 09/08/2013    Procedure: FLEXIBLE SIGMOIDOSCOPY;  Surgeon: Cleotis Nipper, MD;  Location: [facility] ENDOSCOPY;  Service: Endoscopy;  Laterality: N/A;  unprepp  . Amputation Right 09/09/2013    Procedure: AMPUTATION ABOVE KNEE;  Surgeon: Rosetta Posner, MD;  Location: [facility] OR;  Service: Vascular;  Laterality: Right;  . Esophagogastroduodenoscopy (egd) with propofol N/A 01/05/2014    Procedure: ESOPHAGOGASTRODUODENOSCOPY (EGD) WITH PROPOFOL;  Surgeon: Cleotis Nipper, MD;  Location: [facility] ENDOSCOPY;  Service: Endoscopy;  Laterality: N/A;  . Parotidectomy Right 03/29/2014    Procedure: PAROTIDECTOMY;  Surgeon: Ascencion Dike, MD;  Location: [place];  Service: ENT;  Laterality: Right;   . Abdominal aortagram N/A 02/23/2013    Procedure: ABDOMINAL Maxcine Ham;  Surgeon: Wellington Hampshire, MD;  Location: [facility] CATH LAB;  Service: Cardiovascular;  Laterality: N/A;  . Atherectomy N/A 03/02/2013    Procedure: ATHERECTOMY;  Surgeon: Wellington Hampshire, MD;  Location: [facility] CATH LAB;  Service: Cardiovascular;  Laterality: N/A;  . Iliac artery stent Left 09/22/2014  . Abdominal aortagram N/A 09/22/2014    Procedure: ABDOMINAL Maxcine Ham;  Surgeon: Elam Dutch, MD;  Location: [facility] CATH LAB;  Service: Cardiovascular;  Laterality: N/A;     Current Outpatient Prescriptions on File Prior to Visit  Medication Sig Dispense Refill  . acetaminophen (TYLENOL) 325 MG tablet Take 2 tablets (650 mg total) by mouth every 6 (six) hours as needed for mild pain, fever or headache.    Marland Kitchen aspirin EC 81 MG tablet Take 1 tablet (81 mg total) by mouth daily. 150 tablet 2  . cloNIDine (CATAPRES) 0.1 MG tablet Take 0.1 mg by mouth 2 (two) times daily.    . clopidogrel (PLAVIX) 75 MG tablet Take 1 tablet (75 mg total) by mouth daily. 30 tablet 1  . colchicine 0.6 MG tablet Take 0.6 mg by mouth daily.     . folic acid (FOLVITE) 1 MG tablet Take 1 tablet (1 mg total) by mouth daily.    . furosemide (LASIX) 20 MG tablet Take 40 mg by mouth 2 (two) times daily.     Marland Kitchen lisinopril (PRINIVIL,ZESTRIL) 10 MG tablet Take 40 mg by mouth daily.     . metoprolol tartrate (LOPRESSOR) 12.5 mg TABS tablet Take 12.5 mg by mouth 2 (two) times daily.    Marland Kitchen oxyCODONE (ROXICODONE) 15 MG immediate release tablet Take one tablet by mouth every 4 hours as needed for pain 180 tablet 0  . pantoprazole (PROTONIX) 40 MG tablet Take 1 tablet (40 mg total) by mouth 2 (two) times daily. (Patient not taking: Reported on 02/22/2015)    . potassium chloride SA (K-DUR,KLOR-CON) 20 MEQ tablet Take 30 mEq by mouth once. Potassium 30 mEq daily    . thiamine 100 MG tablet Take 1 tablet (100 mg total) by mouth daily.    Marland Kitchen zolpidem (AMBIEN) 10 MG tablet Take 1  tablet (10 mg total) by mouth at bedtime as needed for sleep. 30 tablet 5    Review of systems Gen. the patient feels well Respiratory no cough no sputum Cardiac no chest pain. Abdomen soft nontender no masses  GU bladder not distended Extremities; no claudication pain Skin; small open area on the anterior left leg just above the ankle just opened yesterday according to the patient.  Physical examination Gen. the patient is not in any distress Respiratory clear entry bilaterally Cardiac heart sounds are distant, there is no S3 no S4 JVP is not elevated there  is no coccyx edema Abdomen; no liver spleen or tenderness GU bladder is not distended Left leg; there is tense edema below the knee. There is no real calf tenderness. The patient keeps his leg constantly out of the sun therefore it is a dark brown color however I don't think this reflects anything pathognomonic. Skin; he has a small open area with some purulence on the left leg  Impression/plan #1 tense left leg edema probably secondary to venous insufficiency. He now appears to have a stasis ulcer. #2 worsening renal insufficiency; I would think this is probably due to excessive doses of Lasix #3 I think the better approach to this problem would be him some form of light compression wrap rather than excessive doses of Lasix. He had a duplex ultrasound done last month that was negative for DVT at this point I don't feel pressed to repeat this test. #4 he has a history of coronary artery disease and congestive heart failure. I see no evidence of this clinically.  The patient probably should the have a wrap on his leg and keep his leg elevated. I would guess his worsening renal function is prerenal.

## 2015-04-15 ENCOUNTER — Encounter: Payer: Self-pay | Admitting: Internal Medicine

## 2015-04-15 DIAGNOSIS — L0390 Cellulitis, unspecified: Secondary | ICD-10-CM | POA: Insufficient documentation

## 2015-04-16 ENCOUNTER — Other Ambulatory Visit: Payer: Self-pay | Admitting: *Deleted

## 2015-04-16 ENCOUNTER — Non-Acute Institutional Stay (SKILLED_NURSING_FACILITY): Payer: Medicaid Other | Admitting: Internal Medicine

## 2015-04-16 ENCOUNTER — Encounter (HOSPITAL_COMMUNITY)
Admission: RE | Admit: 2015-04-16 | Discharge: 2015-04-16 | Disposition: A | Discharge status: Home / Self Care | Payer: Medicaid Other | Source: Skilled Nursing Facility | Attending: Internal Medicine | Admitting: Internal Medicine

## 2015-04-16 DIAGNOSIS — N289 Disorder of kidney and ureter, unspecified: Secondary | ICD-10-CM | POA: Diagnosis not present

## 2015-04-16 DIAGNOSIS — I482 Chronic atrial fibrillation: Secondary | ICD-10-CM | POA: Diagnosis not present

## 2015-04-16 DIAGNOSIS — L97929 Non-pressure chronic ulcer of unspecified part of left lower leg with unspecified severity: Principal | ICD-10-CM

## 2015-04-16 DIAGNOSIS — I83224 Varicose veins of left lower extremity with both ulcer of heel and midfoot and inflammation: Secondary | ICD-10-CM

## 2015-04-16 DIAGNOSIS — I83229 Varicose veins of left lower extremity with both ulcer of unspecified site and inflammation: Secondary | ICD-10-CM

## 2015-04-16 DIAGNOSIS — I83225 Varicose veins of left lower extremity with both ulcer other part of foot and inflammation: Secondary | ICD-10-CM | POA: Diagnosis not present

## 2015-04-16 DIAGNOSIS — I83222 Varicose veins of left lower extremity with both ulcer of calf and inflammation: Secondary | ICD-10-CM | POA: Diagnosis not present

## 2015-04-16 DIAGNOSIS — R609 Edema, unspecified: Secondary | ICD-10-CM | POA: Diagnosis not present

## 2015-04-16 DIAGNOSIS — I83228 Varicose veins of left lower extremity with both ulcer of other part of lower extremity and inflammation: Secondary | ICD-10-CM | POA: Diagnosis not present

## 2015-04-16 DIAGNOSIS — I83223 Varicose veins of left lower extremity with both ulcer of ankle and inflammation: Secondary | ICD-10-CM | POA: Diagnosis not present

## 2015-04-16 DIAGNOSIS — I83221 Varicose veins of left lower extremity with both ulcer of thigh and inflammation: Secondary | ICD-10-CM

## 2015-04-16 LAB — WOUND CULTURE

## 2015-04-16 LAB — BASIC METABOLIC PANEL
Anion gap: 7 (ref 5–15)
BUN: 32 mg/dL — AB (ref 6–20)
CALCIUM: 8.7 mg/dL — AB (ref 8.9–10.3)
CO2: 30 mmol/L (ref 22–32)
CREATININE: 1.48 mg/dL — AB (ref 0.61–1.24)
Chloride: 100 mmol/L — ABNORMAL LOW (ref 101–111)
GFR calc Af Amer: 58 mL/min — ABNORMAL LOW (ref >60)
GFR, EST NON AFRICAN AMERICAN: 50 mL/min — AB (ref >60)
GLUCOSE: 106 mg/dL — AB (ref 65–99)
Potassium: 4.1 mmol/L (ref 3.5–5.1)
Sodium: 137 mmol/L (ref 135–145)

## 2015-04-16 MED ORDER — ZOLPIDEM TARTRATE 10 MG PO TABS: 10.0000 mg | ORAL_TABLET | Freq: Every evening | ORAL | Status: DC | PRN

## 2015-04-16 NOTE — Telephone Encounter (Signed)
[facility] 

## 2015-04-16 NOTE — Progress Notes (Signed)
Patient ID: Edwin [name], male   DOB: Oct 20, 1955, 59 y.o.   MRN: [id number]

## 2015-04-21 ENCOUNTER — Other Ambulatory Visit: Payer: Self-pay | Admitting: *Deleted

## 2015-04-21 DIAGNOSIS — I6523 Occlusion and stenosis of bilateral carotid arteries: Secondary | ICD-10-CM

## 2015-04-21 NOTE — Progress Notes (Signed)
Patient ID: Edwin [name], male   DOB: 1956-07-14, 59 y.o.   MRN: [id number]                PROGRESS NOTE  DATE:  04/16/2015         FACILITY: [facility]                   LEVEL OF CARE:   SNF   Acute Visit           CHIEF COMPLAINT:  Follow up lower extremity wound.      HISTORY OF PRESENT ILLNESS:  I saw this patient two days ago.  He has a wound on his left anterior leg in the setting of known peripheral vascular disease with a previous dorsalis pedis ankle brachial index of 0.68 in April.  He has increasing edema in his lower leg which is probably venous insufficiency.   He has had a previous right above-knee amputation.  Culture of this wound has shown MRSA.  He was put on Septra and then, because of his worsening renal function, changed to Levaquin.   He is going to need to go back to doxycycline as the organism is Therapist, music.    BMP Latest Ref Rng 04/16/2015 04/14/2015 04/12/2015  Glucose 65 - 99 mg/dL 106(H) 127(H) 85  BUN 6 - 20 mg/dL 32(H) 30(H) 26(H)  Creatinine 0.61 - 1.24 mg/dL 1.48(H) 1.59(H) 1.34(H)  Sodium 135 - 145 mmol/L 137 135 137  Potassium 3.5 - 5.1 mmol/L 4.1 3.6 3.9  Chloride 101 - 111 mmol/L 100(L) 95(L) 94(L)  CO2 22 - 32 mmol/L 30 32 34(H)  Calcium 8.9 - 10.3 mg/dL 8.7(L) 8.6(L) 8.8(L)   Past Medical History  Diagnosis Date  . ERECTILE DYSFUNCTION 11/27/2007    Qualifier: Diagnosis of  By: Garen Grams    . HYPERLIPIDEMIA 11/27/2007    Qualifier: Diagnosis of  By: Garen Grams    . HYPERTENSION, BENIGN 05/23/2009    Qualifier: Diagnosis of  By: Melvyn Novas MD, Christena Deem   . Coronary artery disease     Cardiac catheterization in 2008 showed 99% stenosis in proximal left circumflex which was treated with Taxus drug-eluting stent. There was mild RCA and LAD disease with normal ejection fraction.  . Tobacco use   . CHF (congestive heart failure)   . Hypertension   . Asthma   . Traumatic amputation of leg(s) (complete) (partial),  unilateral, at or above knee, without mention of complication   . Pressure ulcer, lower back(707.03)   . Other severe protein-calorie malnutrition   . Anemia, unspecified   . Atrial fibrillation   . Gastric ulcer, unspecified as acute or chronic, without mention of hemorrhage, perforation, or obstruction   . PAD (peripheral artery disease)     Previous right SFA stent in 2003. Directional atherectomy right SFA in 01/2013  . Carotid artery occlusion   . Heart murmur   . COPD 11/27/2007    Qualifier: Diagnosis of  By: Garen Grams    . Pneumonia 2000  . Arthritis     "shoulders" (09/22/2014)    Review of Systems  General; No feve no chills Resp: not cough no sputum CVS: no exertional chest pain GI" no masses, no liver no spleen Left leg: he is concerned about swelling. No pain.    PHYSICAL EXAMINATION:   GENERAL APPEARANCE:  The patient is not in any distress.        SKIN:   INSPECTION:  In the left anterior  leg is a blister with open areas.  This is going to need to be carefully followed.  There is, no doubt, a small area of cellulitis.    RESP: clear air entry bilaterally. No crackles CVS: HS are normal. No no sighs of CHF. No increased JVP. No coccyx edema ABD: no masses no liver no spleen.     ASSESSMENT/PLAN:                  Cellulitis, left leg, in the setting of known venous insufficiency and arterial insufficiency.  I am changing him to doxycycline, silver alginate with a Kerlix and light Coban wrap.    Renal insufficiency.   Today, his BUN is 32 and creatinine 1.48.  This is slightly better than last week.   I reduced his Lasix to 40 mg a day two days ago.    Ulceration in the left leg likely secondary to venous stasis. He will need a lite compression to control this. The answer to the edema is not excalating doses of lasix and I have discussed this with the patient   CPT CODE: 74944

## 2015-04-23 ENCOUNTER — Encounter (HOSPITAL_COMMUNITY)
Admission: RE | Admit: 2015-04-23 | Discharge: 2015-04-23 | Disposition: A | Discharge status: Home / Self Care | Payer: Medicaid Other | Source: Skilled Nursing Facility | Attending: Internal Medicine | Admitting: Internal Medicine

## 2015-04-23 ENCOUNTER — Non-Acute Institutional Stay (SKILLED_NURSING_FACILITY): Payer: Medicaid Other | Admitting: Internal Medicine

## 2015-04-23 DIAGNOSIS — I83222 Varicose veins of left lower extremity with both ulcer of calf and inflammation: Secondary | ICD-10-CM

## 2015-04-23 DIAGNOSIS — I83221 Varicose veins of left lower extremity with both ulcer of thigh and inflammation: Secondary | ICD-10-CM | POA: Diagnosis not present

## 2015-04-23 DIAGNOSIS — I83223 Varicose veins of left lower extremity with both ulcer of ankle and inflammation: Secondary | ICD-10-CM | POA: Diagnosis not present

## 2015-04-23 DIAGNOSIS — L97929 Non-pressure chronic ulcer of unspecified part of left lower leg with unspecified severity: Principal | ICD-10-CM

## 2015-04-23 DIAGNOSIS — I482 Chronic atrial fibrillation: Secondary | ICD-10-CM | POA: Diagnosis not present

## 2015-04-23 DIAGNOSIS — L0390 Cellulitis, unspecified: Secondary | ICD-10-CM | POA: Diagnosis not present

## 2015-04-23 DIAGNOSIS — I83224 Varicose veins of left lower extremity with both ulcer of heel and midfoot and inflammation: Secondary | ICD-10-CM | POA: Diagnosis not present

## 2015-04-23 DIAGNOSIS — L0291 Cutaneous abscess, unspecified: Secondary | ICD-10-CM

## 2015-04-23 DIAGNOSIS — I83229 Varicose veins of left lower extremity with both ulcer of unspecified site and inflammation: Secondary | ICD-10-CM | POA: Diagnosis not present

## 2015-04-23 DIAGNOSIS — I872 Venous insufficiency (chronic) (peripheral): Secondary | ICD-10-CM | POA: Diagnosis not present

## 2015-04-23 DIAGNOSIS — I83228 Varicose veins of left lower extremity with both ulcer of other part of lower extremity and inflammation: Secondary | ICD-10-CM

## 2015-04-23 DIAGNOSIS — I83225 Varicose veins of left lower extremity with both ulcer other part of foot and inflammation: Secondary | ICD-10-CM

## 2015-04-23 LAB — BASIC METABOLIC PANEL
ANION GAP: 7 (ref 5–15)
BUN: 29 mg/dL — ABNORMAL HIGH (ref 6–20)
CO2: 32 mmol/L (ref 22–32)
Calcium: 8.7 mg/dL — ABNORMAL LOW (ref 8.9–10.3)
Chloride: 98 mmol/L — ABNORMAL LOW (ref 101–111)
Creatinine, Ser: 1.34 mg/dL — ABNORMAL HIGH (ref 0.61–1.24)
GFR calc Af Amer: >60 mL/min (ref >60)
GFR, EST NON AFRICAN AMERICAN: 57 mL/min — AB (ref >60)
GLUCOSE: 92 mg/dL (ref 65–99)
POTASSIUM: 4.8 mmol/L (ref 3.5–5.1)
Sodium: 137 mmol/L (ref 135–145)

## 2015-04-24 ENCOUNTER — Other Ambulatory Visit: Payer: Self-pay | Admitting: *Deleted

## 2015-04-24 MED ORDER — OXYCODONE HCL 15 MG PO TABS: ORAL_TABLET | ORAL | Status: DC

## 2015-04-24 NOTE — Telephone Encounter (Signed)
[facility] 

## 2015-04-25 ENCOUNTER — Encounter: Payer: Self-pay | Admitting: Internal Medicine

## 2015-04-25 ENCOUNTER — Non-Acute Institutional Stay (SKILLED_NURSING_FACILITY): Payer: Medicaid Other | Admitting: Internal Medicine

## 2015-04-25 DIAGNOSIS — G4700 Insomnia, unspecified: Secondary | ICD-10-CM | POA: Diagnosis not present

## 2015-04-25 DIAGNOSIS — N289 Disorder of kidney and ureter, unspecified: Secondary | ICD-10-CM | POA: Diagnosis not present

## 2015-04-25 NOTE — Progress Notes (Signed)
Patient ID: Edwin [name], male   DOB: 1956-01-23, 59 y.o.   MRN: [id number]  Facility; [facility] This is an acute visit.   Chief complaint --follow-up renal insufficiency-insomnia  HPI-;  This is a patient who has been seen frequently recently for numerous issues including left lower extremity cellulitis treated with doxycycline as is followed by wound care he does have venous stasis changes as well leg is currently wrapped apparently this is stable per wound care.  He also has a history of renal insufficiency at times we have increased his Lasix secondary to increased edema-but his creatinine at times will rise which complicates matterscurrently he is on Lasix 40 mg a day edema appears to be stabilized he does have his leg wrapped  this appears to be helping.  Most recent creatinine was 1.34 BUN of 29 and CO2 level XXXII this appears to be relatively baseline and stable this was on September 26.  Patient's main complaint today is insomnia he says the Ambien 10 mg daily at bedtime is not effective and would like something else.  Otherwise he has no complaints today does not complain of increased shortness of breath chest pain or weakness-he does continue to smoke. Labs.  04/23/2015.  Sodium 137 potassium 4.8 BUN 29 creatinine 1.34 CO2 level was 32  BMP Latest Ref Rng 04/14/2015 04/12/2015 03/21/2015  Glucose 65 - 99 mg/dL 127(H) 85 88  BUN 6 - 20 mg/dL 30(H) 26(H) 20  Creatinine 0.61 - 1.24 mg/dL 1.59(H) 1.34(H) 1.06  Sodium 135 - 145 mmol/L 135 137 140  Potassium 3.5 - 5.1 mmol/L 3.6 3.9 4.4  Chloride 101 - 111 mmol/L 95(L) 94(L) 98(L)  CO2 22 - 32 mmol/L 32 34(H) 34(H)  Calcium 8.9 - 10.3 mg/dL 8.6(L) 8.8(L) 8.7(L)     Past Medical History  Diagnosis Date  . ERECTILE DYSFUNCTION 11/27/2007    Qualifier: Diagnosis of  By: Garen Grams    . HYPERLIPIDEMIA 11/27/2007    Qualifier: Diagnosis of  By: Garen Grams    . HYPERTENSION, BENIGN 05/23/2009    Qualifier:  Diagnosis of  By: Melvyn Novas MD, Christena Deem   . Coronary artery disease     Cardiac catheterization in 2008 showed 99% stenosis in proximal left circumflex which was treated with Taxus drug-eluting stent. There was mild RCA and LAD disease with normal ejection fraction.  . Tobacco use   . CHF (congestive heart failure)   . Hypertension   . Asthma   . Traumatic amputation of leg(s) (complete) (partial), unilateral, at or above knee, without mention of complication   . Pressure ulcer, lower back(707.03)   . Other severe protein-calorie malnutrition   . Anemia, unspecified   . Atrial fibrillation   . Gastric ulcer, unspecified as acute or chronic, without mention of hemorrhage, perforation, or obstruction   . PAD (peripheral artery disease)     Previous right SFA stent in 2003. Directional atherectomy right SFA in 01/2013  . Carotid artery occlusion   . Heart murmur   . COPD 11/27/2007    Qualifier: Diagnosis of  By: Garen Grams    . Pneumonia 2000  . Arthritis     "shoulders" (09/22/2014)   Past Surgical History  Procedure Laterality Date  . Nasal sinus surgery  2004  . Acne cyst removal    . Femoral artery stent Right 2003    [name] 09/22/2014  . Endarterectomy Left 08/16/2013    Procedure: Exploration of Left Neck/Fix Bleeding;  Surgeon: Elam Dutch,  MD;  Location: [facility] OR;  Service: Vascular;  Laterality: Left;  . Esophagogastroduodenoscopy N/A 09/08/2013    Procedure: ESOPHAGOGASTRODUODENOSCOPY (EGD);  Surgeon: Cleotis Nipper, MD;  Location: [facility] ENDOSCOPY;  Service: Endoscopy;  Laterality: N/A;  . Flexible sigmoidoscopy N/A 09/08/2013    Procedure: FLEXIBLE SIGMOIDOSCOPY;  Surgeon: Cleotis Nipper, MD;  Location: [facility] ENDOSCOPY;  Service: Endoscopy;  Laterality: N/A;  unprepp  . Amputation Right 09/09/2013    Procedure: AMPUTATION ABOVE KNEE;  Surgeon: Rosetta Posner, MD;  Location: [facility] OR;  Service: Vascular;  Laterality: Right;  . Esophagogastroduodenoscopy (egd) with propofol N/A  01/05/2014    Procedure: ESOPHAGOGASTRODUODENOSCOPY (EGD) WITH PROPOFOL;  Surgeon: Cleotis Nipper, MD;  Location: [facility] ENDOSCOPY;  Service: Endoscopy;  Laterality: N/A;  . Parotidectomy Right 03/29/2014    Procedure: PAROTIDECTOMY;  Surgeon: Ascencion Dike, MD;  Location: [facility];  Service: ENT;  Laterality: Right;  . Abdominal aortagram N/A 02/23/2013    Procedure: ABDOMINAL Maxcine Ham;  Surgeon: Wellington Hampshire, MD;  Location: [facility] CATH LAB;  Service: Cardiovascular;  Laterality: N/A;  . Atherectomy N/A 03/02/2013    Procedure: ATHERECTOMY;  Surgeon: Wellington Hampshire, MD;  Location: [facility] CATH LAB;  Service: Cardiovascular;  Laterality: N/A;  . Iliac artery stent Left 09/22/2014  . Abdominal aortagram N/A 09/22/2014    Procedure: ABDOMINAL Maxcine Ham;  Surgeon: Elam Dutch, MD;  Location: [facility] CATH LAB;  Service: Cardiovascular;  Laterality: N/A;     Current Outpatient Prescriptions on File Prior to Visit  Medication Sig Dispense Refill  . acetaminophen (TYLENOL) 325 MG tablet Take 2 tablets (650 mg total) by mouth every 6 (six) hours as needed for mild pain, fever or headache.    Marland Kitchen aspirin EC 81 MG tablet Take 1 tablet (81 mg total) by mouth daily. 150 tablet 2  . cloNIDine (CATAPRES) 0.1 MG tablet Take 0.1 mg by mouth 2 (two) times daily.    . clopidogrel (PLAVIX) 75 MG tablet Take 1 tablet (75 mg total) by mouth daily. 30 tablet 1  . colchicine 0.6 MG tablet Take 0.6 mg by mouth daily.     . folic acid (FOLVITE) 1 MG tablet Take 1 tablet (1 mg total) by mouth daily.    . furosemide (LASIX) 20 MG tablet Take 40 mg by mouth QD.     Marland Kitchen lisinopril (PRINIVIL,ZESTRIL) 10 MG tablet Take 40 mg by mouth daily.     . metoprolol tartrate (LOPRESSOR) 12.5 mg TABS tablet Take 12.5 mg by mouth 2 (two) times daily.    Marland Kitchen oxyCODONE (ROXICODONE) 15 MG immediate release tablet Take one tablet by mouth every 4 hours as needed for pain 180 tablet 0  . pantoprazole (PROTONIX) 40 MG tablet Take 1 tablet (40 mg total) by mouth  2 (two) times daily. (Patient not taking: Reported on 02/22/2015)    . potassium chloride SA (K-DUR,KLOR-CON) 20 MEQ tablet Take 40 mEq by mouth once. Potassium 40 mEq daily    . thiamine 100 MG tablet Take 1 tablet (100 mg total) by mouth daily.    Marland Kitchen zolpidem (AMBIEN) 10 MG tablet Take 1 tablet (10 mg total) by mouth at bedtime as needed for sleep. 30 tablet 5    Review of systems Gen. the patient feels well Respiratory no cough no sputum Cardiac no chest pain. Abdomen soft nontender no masses  GU bladder not distended Extremities; no claudication pain Skin;  History cellulitis anterior lower left leg he has responded to course of doxycycline he is  followed by nursing and Dr. Dellia Nims.  Psych is not complaining of anxiety or depression does complain of insomnia  Physical examination He is afebrile pulse is 62 respirations 19 blood pressure 142/90 Gen. the patient is not in any distress sitting comfortably in his wheelchair Respiratory has some diffuse bronchial sounds that clear somewhat with cough this is baseline Cardiac heart sounds are distant, regular rate and rhythm   Left leg;  This is currently wrapped as noted above.  Psych he is alert and oriented pleasant and appropriate  Impression/plan #1 tense left leg edema probably secondary to venous insufficiency. Appears to have Dilaudid a venous stasis ulcer that is being treated as noted above--his weight appears to be relatively stable at 204.6 most recently #2 worsening renal insufficiency; this has stabilized on current Lasix dose which is 40 mg a day creatinine of 1.34 BUN of 29 earlier this week on September 26--I note his CO2 level was 32 which is improved as well #3 -insomnia-patient says Ambien is not effective we will discontinue this and start Restoril 15 mg daily at bedtime when necessary-this was discussed with Dr. Dellia Nims via phone-  [phone] l.

## 2015-04-26 NOTE — Progress Notes (Addendum)
Patient ID: Edwin [name], male   DOB: [birth year], 59 y.o.   MRN: [id number]                PROGRESS NOTE  DATE:  04/23/2015         FACILITY: [facility]                      LEVEL OF CARE:   SNF   Acute Visit                         CHIEF COMPLAINT:  Follow up left lower extremity wound.      HISTORY OF PRESENT ILLNESS:  This is a man whom I saw last week.    He had developed lower extremity edema, probably secondary to venous insufficiency.  He has known PAD, as well, and has had a previous right above-knee amputation.    Culture of the wound on the anterior left leg grew MRSA.  He was changed from Septra to [place].  Then, when the culture showed MRSA, he was treated definitively with doxycycline.    He had been demanding escalated doses of Lasix to control the edema.  This actually pushed his creatinine higher.  I reduced this to 40 mg a day last week and I am not willing to go any higher on this.     PHYSICAL EXAMINATION:   SKIN:   INSPECTION:  Left leg:  There is still an open area here, but no evidence of infection.  He is complaining that the alginate sticks to the wound.  I will change him to silver collagen.  Ultimately, he is going to need a graded pressure stocking.  There are concerns about his arterial insufficiency, although I think he has tolerated a Kerlix/Coban quite well.      ASSESSMENT/PLAN:                  Cellulitis of the left leg in the presence of known venous insufficiency and arterial insufficiency.  He has completed his doxycycline.  I will change him to silver collagen.     Renal insufficiency.   His creatinine is down to 1.34 today, which is an improvement with less Lasix.

## 2015-05-10 ENCOUNTER — Ambulatory Visit: Payer: Self-pay | Admitting: Vascular Surgery

## 2015-05-10 ENCOUNTER — Ambulatory Visit (HOSPITAL_COMMUNITY)
Admission: RE | Admit: 2015-05-10 | Discharge: 2015-05-10 | Disposition: A | Discharge status: Home / Self Care | Payer: Medicaid Other | Source: Ambulatory Visit | Attending: Vascular Surgery | Admitting: Vascular Surgery

## 2015-05-10 ENCOUNTER — Ambulatory Visit (INDEPENDENT_AMBULATORY_CARE_PROVIDER_SITE_OTHER)
Admit: 2015-05-10 | Discharge: 2015-05-10 | Disposition: A | Discharge status: Home / Self Care | Payer: Medicaid Other | Attending: Vascular Surgery | Admitting: Vascular Surgery

## 2015-05-10 ENCOUNTER — Encounter (HOSPITAL_COMMUNITY): Payer: Self-pay

## 2015-05-10 DIAGNOSIS — Z95828 Presence of other vascular implants and grafts: Secondary | ICD-10-CM | POA: Insufficient documentation

## 2015-05-10 DIAGNOSIS — F17200 Nicotine dependence, unspecified, uncomplicated: Secondary | ICD-10-CM | POA: Diagnosis not present

## 2015-05-10 DIAGNOSIS — E785 Hyperlipidemia, unspecified: Secondary | ICD-10-CM | POA: Insufficient documentation

## 2015-05-10 DIAGNOSIS — I70202 Unspecified atherosclerosis of native arteries of extremities, left leg: Secondary | ICD-10-CM | POA: Insufficient documentation

## 2015-05-10 DIAGNOSIS — I739 Peripheral vascular disease, unspecified: Secondary | ICD-10-CM

## 2015-05-10 DIAGNOSIS — I714 Abdominal aortic aneurysm, without rupture: Secondary | ICD-10-CM | POA: Insufficient documentation

## 2015-05-10 DIAGNOSIS — I10 Essential (primary) hypertension: Secondary | ICD-10-CM | POA: Insufficient documentation

## 2015-05-10 DIAGNOSIS — I6523 Occlusion and stenosis of bilateral carotid arteries: Secondary | ICD-10-CM | POA: Insufficient documentation

## 2015-05-15 ENCOUNTER — Encounter: Payer: Self-pay | Admitting: Vascular Surgery

## 2015-05-17 ENCOUNTER — Encounter: Payer: Self-pay | Admitting: Vascular Surgery

## 2015-05-17 ENCOUNTER — Ambulatory Visit (INDEPENDENT_AMBULATORY_CARE_PROVIDER_SITE_OTHER): Payer: Medicaid Other | Admitting: Vascular Surgery

## 2015-05-17 VITALS — BP 117/75 | HR 70 | Temp 98.4°F | Resp 16 | Ht 69.0 in | Wt 202.0 lb

## 2015-05-17 DIAGNOSIS — I6523 Occlusion and stenosis of bilateral carotid arteries: Secondary | ICD-10-CM

## 2015-05-17 DIAGNOSIS — I739 Peripheral vascular disease, unspecified: Secondary | ICD-10-CM
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
## 2015-05-22 NOTE — Addendum Note (Signed)
Addended by: Dorthula Rue L on: 05/22/2015 05:19 PM   Modules accepted: Orders

## 2015-05-23 ENCOUNTER — Encounter (HOSPITAL_COMMUNITY)
Admission: AD | Admit: 2015-05-23 | Discharge: 2015-05-23 | Disposition: A | Discharge status: Home / Self Care | Payer: Medicaid Other | Source: Skilled Nursing Facility | Attending: Internal Medicine | Admitting: Internal Medicine

## 2015-05-23 DIAGNOSIS — D649 Anemia, unspecified: Secondary | ICD-10-CM | POA: Insufficient documentation

## 2015-05-23 LAB — BASIC METABOLIC PANEL
Anion gap: 9 (ref 5–15)
BUN: 30 mg/dL — ABNORMAL HIGH (ref 6–20)
CALCIUM: 9 mg/dL (ref 8.9–10.3)
CHLORIDE: 96 mmol/L — AB (ref 101–111)
CO2: 32 mmol/L (ref 22–32)
CREATININE: 1.46 mg/dL — AB (ref 0.61–1.24)
GFR, EST AFRICAN AMERICAN: 59 mL/min — AB (ref >60)
GFR, EST NON AFRICAN AMERICAN: 51 mL/min — AB (ref >60)
GLUCOSE: 117 mg/dL — AB (ref 65–99)
Potassium: 4.1 mmol/L (ref 3.5–5.1)
Sodium: 137 mmol/L (ref 135–145)

## 2015-05-23 LAB — CBC WITH DIFFERENTIAL/PLATELET
BASOS ABS: 0.1 10*3/uL (ref 0.0–0.1)
BASOS PCT: 1 %
EOS ABS: 0.2 10*3/uL (ref 0.0–0.7)
Eosinophils Relative: 3 %
HCT: 45.8 % (ref 39.0–52.0)
HEMOGLOBIN: 15.1 g/dL (ref 13.0–17.0)
Lymphocytes Relative: 31 %
Lymphs Abs: 2.2 10*3/uL (ref 0.7–4.0)
MCH: 28.1 pg (ref 26.0–34.0)
MCHC: 33 g/dL (ref 30.0–36.0)
MCV: 85.1 fL (ref 78.0–100.0)
MONOS PCT: 7 %
Monocytes Absolute: 0.5 10*3/uL (ref 0.1–1.0)
NEUTROS ABS: 4.2 10*3/uL (ref 1.7–7.7)
NEUTROS PCT: 59 %
Platelets: 156 10*3/uL (ref 150–400)
RBC: 5.38 MIL/uL (ref 4.22–5.81)
RDW: 19.1 % — ABNORMAL HIGH (ref 11.5–15.5)
WBC: 7 10*3/uL (ref 4.0–10.5)

## 2015-05-28 ENCOUNTER — Encounter (HOSPITAL_COMMUNITY)
Admission: RE | Admit: 2015-05-28 | Discharge: 2015-05-28 | Disposition: A | Discharge status: Home / Self Care | Payer: Medicaid Other | Source: Skilled Nursing Facility | Attending: Internal Medicine | Admitting: Internal Medicine

## 2015-05-28 DIAGNOSIS — D649 Anemia, unspecified: Secondary | ICD-10-CM | POA: Diagnosis not present

## 2015-05-28 LAB — BASIC METABOLIC PANEL
Anion gap: 6 (ref 5–15)
BUN: 27 mg/dL — AB (ref 6–20)
CALCIUM: 8.8 mg/dL — AB (ref 8.9–10.3)
CHLORIDE: 96 mmol/L — AB (ref 101–111)
CO2: 32 mmol/L (ref 22–32)
CREATININE: 1.43 mg/dL — AB (ref 0.61–1.24)
GFR calc non Af Amer: 53 mL/min — ABNORMAL LOW (ref >60)
GLUCOSE: 130 mg/dL — AB (ref 65–99)
Potassium: 3.9 mmol/L (ref 3.5–5.1)
Sodium: 134 mmol/L — ABNORMAL LOW (ref 135–145)

## 2015-05-30 ENCOUNTER — Other Ambulatory Visit: Payer: Self-pay | Admitting: *Deleted

## 2015-05-30 MED ORDER — OXYCODONE HCL 15 MG PO TABS: ORAL_TABLET | ORAL | Status: DC

## 2015-05-30 NOTE — Telephone Encounter (Signed)
[facility] 

## 2015-06-12 ENCOUNTER — Other Ambulatory Visit: Payer: Self-pay | Admitting: *Deleted

## 2015-06-12 MED ORDER — TEMAZEPAM 30 MG PO CAPS: ORAL_CAPSULE | ORAL | Status: DC

## 2015-06-12 NOTE — Telephone Encounter (Signed)
[facility] 

## 2015-06-27 ENCOUNTER — Other Ambulatory Visit: Payer: Self-pay | Admitting: *Deleted

## 2015-06-27 MED ORDER — OXYCODONE HCL 15 MG PO TABS: ORAL_TABLET | ORAL | Status: DC

## 2015-06-27 NOTE — Telephone Encounter (Signed)
[facility] 

## 2015-07-10 ENCOUNTER — Non-Acute Institutional Stay (SKILLED_NURSING_FACILITY): Payer: Medicaid Other | Admitting: Internal Medicine

## 2015-07-10 ENCOUNTER — Encounter: Payer: Self-pay | Admitting: Internal Medicine

## 2015-07-10 DIAGNOSIS — G8929 Other chronic pain: Secondary | ICD-10-CM

## 2015-07-10 DIAGNOSIS — I739 Peripheral vascular disease, unspecified: Secondary | ICD-10-CM | POA: Diagnosis not present

## 2015-07-10 DIAGNOSIS — R609 Edema, unspecified: Secondary | ICD-10-CM | POA: Diagnosis not present

## 2015-07-10 DIAGNOSIS — I10 Essential (primary) hypertension: Secondary | ICD-10-CM

## 2015-07-10 DIAGNOSIS — J42 Unspecified chronic bronchitis: Secondary | ICD-10-CM

## 2015-07-10 NOTE — Progress Notes (Signed)
Patient ID: Edwin [name], male   DOB: 1955-08-08, 59 y.o.   MRN: [id number]              This is a routine visit.  Level of care skilled.  Facility [facility].   Chief complaint medical management of chronic issues including peripheral vascular disease status post right below the knee amputation history of CHF diastolic-COPD-gout-HTN   .  History of present illness .  Patient is a  pleasant 59 year old male with the above diagnoses -he came here after undergoing  a right below the knee amputation secondary to infected right foot-this appears to have healed fairly unremarkable  He has recently been approved for a prosthesis a-- stump area has been well-healed for some time--he has received this but apparently does not use this much per nursing.     He also has a history of a small left great toe wound  he does have severe peripheral arterial disease  with 50% common femoral artery stenosis status post stenting of the external iliac artery this past February-- this has been followed by Dr. Dellia Nims as well as the vascular specialist-this is complicated with patient smoking history continues to smoke heavily--Dr. Bufford Spikes MD has discussed this with him in the past--the left heel wound apparently has largely resolved although speaking with patient today he still has concerns about it.  He also has a small area on his left tibia that apparently stable at this time--it is currently covered and vascular M.D. did note this during last visit suggested revisit at some point if it did not resolve-we will write an order for reconsult  I do note per review of vascular note on 05/17/2015 he does have a history as well as an abdominal aortic aneurysm most recently 3.3 cm in diameter-vascular is following this and have recommended an ultrasound in approximately one near.  The also will follow-up at next visit with ABIs.  He also has Bilat carotid stenosis which is followed by vascular  He is  on aspirin and Plavix as recommended by vascular M.D.  .    -- stay here was complicated with some history of GI bleeding this was thought to be possibly nonsteroid induced-and his hemoglobin has stabilized he did have an endoscopy done which apparently did not show any acute pathology --he continues on Protonix .  Also has had some history of edema left lower extremity-he does have a history of diastolic CHF --he is on Lasix 40 mg daily a-.    at various points we have increases slightly however his renal functioning CO2 level rose--so he has been on 40 mg for some time-in regards to the edema on his left leg this appears to have stabilized Dr. 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            he does have a history COPD but this has been a non-acute issue here for some time -does ambulate about the facility in  a wheelchair and appears to be doing quite well.--Although certainly his continue smoking puts him at increased risk  He does complain of shoulder pain at times x-rays have showed degenerative changes  As he does receive oxycodone apparently with relief.   .  We have also been following his blood pressure--this had been in issue previously we increased his lisinopril to 40 mg a day-also subsequently started clonidine 0.1 mg twice a day- His blood pressure still remained somewhat elevated and clonidine was increased up to 0.2 mg a day  this appears to have helped blood pressures have stabilized most recently 135/81-139/80 this appears to be fairly stable at this point in addition he is on metoprolol 12.5 mg twice a day.  He also has complained of insomnia we have tried various agents most recently he is on Restoril 30 mg daily at bedtime this apparently is helping some.        Family medical social history has been reviewed including admission note on 09/13/2013 previous progress notes including vascular M.D. note on 05/17/2015 .  Medications have been reviewed per [facility] These include Tylenol.  Albuterol nebulizers every 6 hours when necessary.2 Clonidine 0.1 mg twice a day.  Colchicine 0.6 mg daily.  Lasix 40 mg daily.  Metoprolol 12.5 mg twice a day  Fosinopril 40 mg daily.  Oxycodone 15 mg every 4 hours when necessary pain.  Potassium 40 mEq every morning.  Protonix 40 mg daily.  Enteric-coated aspirin 81 mg daily.  Restoril 30 mg by mouth daily at bedtime when necessary  Plavix 75 mg daily .  Review of systems  In general no complaints of fever or chills.  Skin does not complaining a rash or itching .-- History of left tibial abrasion-as well as left great toe wound as noted above  Head ears eyes nose mouth and throat-does not complain of visual changes.  Respiratory no complaints shortness breath or cough.  Cardiac no chest pain -- some baseline left foot edema  GI does not complaining of any abdominal pain nausea or vomiting.  Muscle skeletal -has chronic shoulder pain right greater than left-as noted above but does not really complain of that today.  Neurologic does not complaining of dizziness headache or syncopal type episodes.  Psych no complaint of depression no anxiety apparently depression had been an issue in the past--d .  Physical exam     He is afebrile pulse 62 respirations 20 blood pressure 135/81 last listed weight 208 this appears to gain of about 10 pounds over the past  several months again he eats very well   In general this is a pleasant middle-age male in no distress sitting comfortably in his wheelchair .  His skin is warm and dry --on left great toe there is a small somewhat scabbed  open area there is no active drainage bleeding or acute tenderness to palpation-patient still has concerns about this area although per nursing staff this is much improved  Left tibial abrasion is currently covered patient did not wish for this to be examined today  Eyes sclera and conjunctiva are clear visual acuity appears intact pupils are reactive and equal bilaterally.  Oropharynx is clear mucous membranes moist   Chest --some diffuse bronchial sounds-there is no labored breathing  .  Heart --  regular rate and rhythm -- he has some minimal edema of his left foot but this appears to be fairly baseline--compression hose is applied and this appears to be helping with the edema .  Abdomen is soft nontender with positive bowel sounds.   Muscle skeletal does have right below the knee amputation-He has some limited range of motion of his right shoulder compared to left-I moves his extremities at baseline I do not note any deformities.  .   Neurologicis grossly intact no lateralizing findings--creatinine is intact no lateralizing findings   Psych he is alert and oriented pleasant and appropriate.   Labs  05/28/2015.  Sodium 134 potassium 3.9 BUN 27 creatinine 1.43.  10/ 26 2016.  WBC 7.0 hemoglobin 15.1 platelets 156.      01/17/2015.  Sodium 138 potassium 3.9 CO2 34 BUN 18 creatinine 1.39.  WBC 5.7 hemoglobin 17.1 platelets 177.    11/24/2014.  Sodium 137 potassium 3.9 BUN 26 creatinine 1.24 CO2 34.  11/17/2014.  Hemoglobin A1c 5.6.  11/10/2014.  Liver function tests within normal limits.  WBC 6.7 hemoglobin 16.1 platelets 162.  Uric acid 10.3  11/10/2014.  Sodium 136 potassium 3.6 BUN 27 creatinine 1.56 CO2 33-of note glucose  141.  WBC 6.7 hemoglobin 16.1 platelets 162.  Uric acid level X.3.  08/05/2014.  BNP 218.6.    05/01/2014.  Cholesterol 146-triglycerides 208-HDL 29-LDL 95.  Sodium 141 potassium 4.5 BUN 25 creatinine 1.26.  Liver function tests within normal limits except albumin of 3.3.  03/13/2014-BNP 634.  TSH 3.843.    03/29/2014.  Sodium 139 potassium 4.5 BUN 27 creatinine 1.23.  WBC 5.7 hemoglobin 13.0 platelets 202.  03/13/2014.  [id number]  -TSH-3.843  01/20/2014.  WBC 4.9 hemoglobin 9.7 platelets 236.  Marland Kitchen  01/09/2014.  The WBC 5.0 hemoglobin 9.3 platelets 243.  Sodium 139 potassium 4.4 BUN 40 creatinine 1.3.  11/30/2013.  BNP 903.8  Uric acid-6.6  March l 2015.  Liver function tests showed an albumin of 1.6-   Assessment and plan .  #1- Status post partial parathyroidectomy-appears to be doing well --    -patient did have follow-up scheduled in March but apparently did not feel he needed to go-per chart review he was seen Dr. Benjamine Mola for the parathyroidectomy-and tolerated the procedure well again apparently margins were clear--- He has no complaints today--it appears arranging follow-up will be a  challenge at this point patient feels he is doing well  #2- History of shoulder pain-again this appears to be arthritic oxycodone appears to help   #3-history of GI bleed-he is on Protonix hemoglobin has shown improvement at this point will continue to monitor endoscopy did not appear to show a serious etiology-will update a CBC most recently hemoglobin 15.1in October.   #4-history peripheral vascular disease again there has been hesitancy for anticoagulation secondary to GI bleed--l this appears stable he does not complaining of pain he does receive oxycodone as needed for pain--he is followed closely by vascular--he is on aspirin as well as Plavix per a vascular recommendation--will write an order for reconsult per patient request  .  #5-hypertension-- Blood  pressure appears to have improved--he is on numerous agents including clonidine 0.2 mg twice a day metoprolol 12.5 mg twice a day lisinopril 40 mg daily           [id number] of diastolic CHF - This appears to be  fairly well compensated at this point on Lasix with potassium  we have been somewhat conservative here with his renal function being variable-nonetheless this appears stable --his weight gain I suspect is appetite related.  In regards to his edema this appears to have stabilized with the compression hose   #7-history of gout? Pseudogout-he  has responded to colchicine he is on this empirically in this at this point appears to be effective --he does have a history of elevated uric acid  #8- History of renal insufficiency this appears to be at baseline on recent lab with creatinine of 1.43 BUN of 27 we will update this    .    Marland Kitchen  #9-COPD-this is stable he is on when necessary nebulizers but apparently does not really need to use much    #10 left great toe wound  -again he is at risk with his compromised circulhatory issues-we will await vascular input-I also discussed this with wound care nurse who will look at it  #11 coronary artery disease this is been relatively asymptomatic pharmacy has left  recommendation to possibly start a statin- speaking with patient he said he did not do well with Lipitor -- he describes it just generally felt very weak and loopy - apparently had been on Crestor at one time-and did better with that.  Would appreciate vascular MDs input on this-again he has requested to see vascular again-we will obtain a lipid panel in the meantime   [id number]-of note greater than 40 minutes spent assessing patient-discussing his concerns-reviewing his chart-and coordinating formulating a plan of care for numerous diagnoses-of note greater than 50% of time spent coordinating plan of care

## 2015-07-11 ENCOUNTER — Encounter (HOSPITAL_COMMUNITY)
Admission: AD | Admit: 2015-07-11 | Discharge: 2015-07-11 | Disposition: A | Discharge status: Home / Self Care | Payer: Medicaid Other | Source: Skilled Nursing Facility | Attending: Internal Medicine | Admitting: Internal Medicine

## 2015-07-11 ENCOUNTER — Telehealth: Payer: Self-pay

## 2015-07-11 DIAGNOSIS — S88011D Complete traumatic amputation at knee level, right lower leg, subsequent encounter: Secondary | ICD-10-CM | POA: Insufficient documentation

## 2015-07-11 DIAGNOSIS — I482 Chronic atrial fibrillation: Secondary | ICD-10-CM | POA: Diagnosis not present

## 2015-07-11 LAB — CBC WITH DIFFERENTIAL/PLATELET
BASOS ABS: 0.1 10*3/uL (ref 0.0–0.1)
BASOS PCT: 1 %
EOS ABS: 0.2 10*3/uL (ref 0.0–0.7)
EOS PCT: 3 %
HCT: 45.9 % (ref 39.0–52.0)
Hemoglobin: 15.4 g/dL (ref 13.0–17.0)
Lymphocytes Relative: 36 %
Lymphs Abs: 2.4 10*3/uL (ref 0.7–4.0)
MCH: 29.7 pg (ref 26.0–34.0)
MCHC: 33.6 g/dL (ref 30.0–36.0)
MCV: 88.4 fL (ref 78.0–100.0)
MONO ABS: 0.3 10*3/uL (ref 0.1–1.0)
MONOS PCT: 5 %
Neutro Abs: 3.7 10*3/uL (ref 1.7–7.7)
Neutrophils Relative %: 56 %
PLATELETS: 167 10*3/uL (ref 150–400)
RBC: 5.19 MIL/uL (ref 4.22–5.81)
RDW: 17.2 % — AB (ref 11.5–15.5)
WBC: 6.7 10*3/uL (ref 4.0–10.5)

## 2015-07-11 LAB — LIPID PANEL
CHOL/HDL RATIO: 8.8 ratio
Cholesterol: 175 mg/dL (ref 0–200)
HDL: 20 mg/dL — ABNORMAL LOW (ref >40)
LDL CALC: 93 mg/dL (ref 0–99)
TRIGLYCERIDES: 308 mg/dL — AB (ref <150)
VLDL: 62 mg/dL — AB (ref 0–40)

## 2015-07-11 LAB — COMPREHENSIVE METABOLIC PANEL
ALBUMIN: 3.8 g/dL (ref 3.5–5.0)
ALK PHOS: 55 U/L (ref 38–126)
ALT: 13 U/L — AB (ref 17–63)
ANION GAP: 7 (ref 5–15)
AST: 15 U/L (ref 15–41)
BILIRUBIN TOTAL: 0.4 mg/dL (ref 0.3–1.2)
BUN: 30 mg/dL — AB (ref 6–20)
CALCIUM: 9.2 mg/dL (ref 8.9–10.3)
CO2: 32 mmol/L (ref 22–32)
CREATININE: 1.59 mg/dL — AB (ref 0.61–1.24)
Chloride: 96 mmol/L — ABNORMAL LOW (ref 101–111)
GFR calc Af Amer: 54 mL/min — ABNORMAL LOW (ref >60)
GFR calc non Af Amer: 46 mL/min — ABNORMAL LOW (ref >60)
GLUCOSE: 117 mg/dL — AB (ref 65–99)
Potassium: 3.9 mmol/L (ref 3.5–5.1)
Sodium: 135 mmol/L (ref 135–145)
TOTAL PROTEIN: 6.4 g/dL — AB (ref 6.5–8.1)

## 2015-07-11 NOTE — Telephone Encounter (Signed)
Phone call from nurse [place].  Reported pt. Has a nonhealing wound on left anterior Tibia.  Reported was told to make another appt., if wound hasn't healed in 6 weeks.  Nurse reported that there is no increase in size, no redness, and no fever/chills.  Reported there is a "minimal amt. of serous-sang. Drainage."  Reported the house MD and PA are monitoring the wound.  Have been applying NS gauze and Allevyn dressing qod.  Advised nurse will have a scheduler call back with appt.

## 2015-07-12 NOTE — Telephone Encounter (Signed)
Spoke with Inez Catalina who will notify mavis of pts appt, dpm

## 2015-07-13 ENCOUNTER — Encounter: Payer: Self-pay | Admitting: Family

## 2015-07-16 ENCOUNTER — Non-Acute Institutional Stay (SKILLED_NURSING_FACILITY): Payer: Medicaid Other | Admitting: Internal Medicine

## 2015-07-16 DIAGNOSIS — L97929 Non-pressure chronic ulcer of unspecified part of left lower leg with unspecified severity: Secondary | ICD-10-CM

## 2015-07-16 DIAGNOSIS — I83229 Varicose veins of left lower extremity with both ulcer of unspecified site and inflammation: Secondary | ICD-10-CM | POA: Diagnosis not present

## 2015-07-16 DIAGNOSIS — L03116 Cellulitis of left lower limb: Secondary | ICD-10-CM | POA: Diagnosis not present

## 2015-07-18 ENCOUNTER — Encounter (HOSPITAL_COMMUNITY)
Admission: AD | Admit: 2015-07-18 | Discharge: 2015-07-18 | Disposition: A | Discharge status: Home / Self Care | Payer: Medicaid Other | Source: Skilled Nursing Facility | Attending: Internal Medicine | Admitting: Internal Medicine

## 2015-07-18 DIAGNOSIS — I482 Chronic atrial fibrillation: Secondary | ICD-10-CM | POA: Diagnosis not present

## 2015-07-18 LAB — BASIC METABOLIC PANEL
Anion gap: 9 (ref 5–15)
BUN: 25 mg/dL — ABNORMAL HIGH (ref 6–20)
CO2: 32 mmol/L (ref 22–32)
Calcium: 9 mg/dL (ref 8.9–10.3)
Chloride: 94 mmol/L — ABNORMAL LOW (ref 101–111)
Creatinine, Ser: 1.81 mg/dL — ABNORMAL HIGH (ref 0.61–1.24)
GFR calc Af Amer: 46 mL/min — ABNORMAL LOW (ref >60)
GFR calc non Af Amer: 40 mL/min — ABNORMAL LOW (ref >60)
Glucose, Bld: 110 mg/dL — ABNORMAL HIGH (ref 65–99)
Potassium: 3.6 mmol/L (ref 3.5–5.1)
Sodium: 135 mmol/L (ref 135–145)

## 2015-07-19 ENCOUNTER — Other Ambulatory Visit: Payer: Self-pay

## 2015-07-19 ENCOUNTER — Encounter: Payer: Self-pay | Admitting: Family

## 2015-07-19 ENCOUNTER — Ambulatory Visit (INDEPENDENT_AMBULATORY_CARE_PROVIDER_SITE_OTHER): Payer: Medicaid Other | Admitting: Family

## 2015-07-19 VITALS — BP 122/72 | HR 62 | Temp 98.3°F | Resp 16 | Wt 207.0 lb

## 2015-07-19 DIAGNOSIS — Z9862 Peripheral vascular angioplasty status: Secondary | ICD-10-CM | POA: Insufficient documentation

## 2015-07-19 DIAGNOSIS — Z720 Tobacco use: Secondary | ICD-10-CM

## 2015-07-19 DIAGNOSIS — I70248 Atherosclerosis of native arteries of left leg with ulceration of other part of lower left leg: Secondary | ICD-10-CM

## 2015-07-19 DIAGNOSIS — I6523 Occlusion and stenosis of bilateral carotid arteries: Secondary | ICD-10-CM

## 2015-07-19 DIAGNOSIS — I70209 Unspecified atherosclerosis of native arteries of extremities, unspecified extremity: Secondary | ICD-10-CM | POA: Insufficient documentation

## 2015-07-19 DIAGNOSIS — I6529 Occlusion and stenosis of unspecified carotid artery: Secondary | ICD-10-CM | POA: Insufficient documentation

## 2015-07-19 DIAGNOSIS — F17200 Nicotine dependence, unspecified, uncomplicated: Secondary | ICD-10-CM

## 2015-07-19 DIAGNOSIS — Z95828 Presence of other vascular implants and grafts: Secondary | ICD-10-CM | POA: Insufficient documentation
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
## 2015-07-19 NOTE — Patient Instructions (Signed)

## 2015-07-20 ENCOUNTER — Encounter (HOSPITAL_COMMUNITY)
Admission: AD | Admit: 2015-07-20 | Discharge: 2015-07-20 | Disposition: A | Discharge status: Home / Self Care | Payer: Medicaid Other | Source: Skilled Nursing Facility | Attending: Internal Medicine | Admitting: Internal Medicine

## 2015-07-20 DIAGNOSIS — I482 Chronic atrial fibrillation: Secondary | ICD-10-CM | POA: Diagnosis not present

## 2015-07-20 LAB — BASIC METABOLIC PANEL
Anion gap: 8 (ref 5–15)
BUN: 26 mg/dL — AB (ref 6–20)
CALCIUM: 8.7 mg/dL — AB (ref 8.9–10.3)
CHLORIDE: 98 mmol/L — AB (ref 101–111)
CO2: 28 mmol/L (ref 22–32)
CREATININE: 1.41 mg/dL — AB (ref 0.61–1.24)
GFR calc non Af Amer: 53 mL/min — ABNORMAL LOW (ref >60)
Glucose, Bld: 123 mg/dL — ABNORMAL HIGH (ref 65–99)
Potassium: 3.8 mmol/L (ref 3.5–5.1)
Sodium: 134 mmol/L — ABNORMAL LOW (ref 135–145)

## 2015-07-22 NOTE — Progress Notes (Signed)
Patient ID: Edwin [name], male   DOB: 29-Mar-1956, 59 y.o.   MRN: [id number]                PROGRESS NOTE  DATE:  07/16/2015           FACILITY: [facility]                      LEVEL OF CARE:   SNF   Acute Visit             CHIEF COMPLAINT:  Wound on the left lower extremity.     HISTORY OF PRESENT ILLNESS:  This is a recurrent problem for this man who has a history of PAD as well as venous insufficiency.  He follows with Vascular Surgery.  He has had a wound on his leg previously which has waxed and waned, culturing MRSA in September for which I had to give him doxycycline.  He does not tolerate Septra as he develops increasing renal insufficiency.    He has apparently reopened the area last week.  There is tenderness here.  He has not been wearing any compression.  He follows with Dr. Oneida Alar of Vascular Surgery.  He had had a stent in his left external iliac artery in February 2016.  He also has a 50% left common femoral artery stenosis.       PHYSICAL EXAMINATION:   GENERAL APPEARANCE:  The patient is not in any distress.         SKIN:   INSPECTION:  He has had a previous wound on the left toe which is not open currently.  Peripheral pulses are difficult to feel.  On the left anterior lower leg, there is an open area here with surrounding erythema and tenderness suggesting some degree of cellulitis.  The wound itself does not look ominous.    ASSESSMENT/PLAN:                    Left lower extremity cellulitis with an open wound.  I am going to use silver alginate on this with a foam dressing.  Doxycycline 100 b.i.d. for seven days.  I believe he follows up with Dr. Oneida Alar this week.     CPT CODE: 91478

## 2015-07-23 ENCOUNTER — Encounter (HOSPITAL_COMMUNITY)
Admission: RE | Admit: 2015-07-23 | Discharge: 2015-07-23 | Disposition: A | Discharge status: Home / Self Care | Payer: Medicaid Other | Source: Skilled Nursing Facility | Attending: Internal Medicine | Admitting: Internal Medicine

## 2015-07-23 DIAGNOSIS — I482 Chronic atrial fibrillation: Secondary | ICD-10-CM | POA: Diagnosis not present

## 2015-07-23 LAB — BASIC METABOLIC PANEL
ANION GAP: 6 (ref 5–15)
BUN: 23 mg/dL — ABNORMAL HIGH (ref 6–20)
CHLORIDE: 98 mmol/L — AB (ref 101–111)
CO2: 32 mmol/L (ref 22–32)
CREATININE: 1.42 mg/dL — AB (ref 0.61–1.24)
Calcium: 8.8 mg/dL — ABNORMAL LOW (ref 8.9–10.3)
GFR calc non Af Amer: 53 mL/min — ABNORMAL LOW (ref >60)
Glucose, Bld: 138 mg/dL — ABNORMAL HIGH (ref 65–99)
POTASSIUM: 3.8 mmol/L (ref 3.5–5.1)
SODIUM: 136 mmol/L (ref 135–145)

## 2015-07-27 ENCOUNTER — Encounter (HOSPITAL_COMMUNITY)
Admission: AD | Admit: 2015-07-27 | Discharge: 2015-07-27 | Disposition: A | Discharge status: Home / Self Care | Payer: Medicaid Other | Source: Skilled Nursing Facility | Attending: Internal Medicine | Admitting: Internal Medicine

## 2015-07-27 DIAGNOSIS — I482 Chronic atrial fibrillation: Secondary | ICD-10-CM | POA: Diagnosis not present

## 2015-07-27 LAB — BASIC METABOLIC PANEL
Anion gap: 5 (ref 5–15)
BUN: 22 mg/dL — AB (ref 6–20)
CHLORIDE: 102 mmol/L (ref 101–111)
CO2: 30 mmol/L (ref 22–32)
CREATININE: 1.32 mg/dL — AB (ref 0.61–1.24)
Calcium: 8.7 mg/dL — ABNORMAL LOW (ref 8.9–10.3)
GFR calc non Af Amer: 58 mL/min — ABNORMAL LOW (ref >60)
Glucose, Bld: 133 mg/dL — ABNORMAL HIGH (ref 65–99)
Potassium: 3.9 mmol/L (ref 3.5–5.1)
Sodium: 137 mmol/L (ref 135–145)

## 2015-07-30 ENCOUNTER — Encounter (HOSPITAL_COMMUNITY)
Admission: RE | Admit: 2015-07-30 | Discharge: 2015-07-30 | Disposition: A | Discharge status: Home / Self Care | Payer: Medicaid Other | Source: Skilled Nursing Facility | Attending: Internal Medicine | Admitting: Internal Medicine

## 2015-07-30 DIAGNOSIS — I10 Essential (primary) hypertension: Secondary | ICD-10-CM | POA: Insufficient documentation

## 2015-07-30 DIAGNOSIS — N189 Chronic kidney disease, unspecified: Secondary | ICD-10-CM | POA: Insufficient documentation

## 2015-07-30 LAB — BASIC METABOLIC PANEL
ANION GAP: 7 (ref 5–15)
BUN: 20 mg/dL (ref 6–20)
CALCIUM: 8.8 mg/dL — AB (ref 8.9–10.3)
CO2: 33 mmol/L — AB (ref 22–32)
Chloride: 98 mmol/L — ABNORMAL LOW (ref 101–111)
Creatinine, Ser: 1.63 mg/dL — ABNORMAL HIGH (ref 0.61–1.24)
GFR calc Af Amer: 52 mL/min — ABNORMAL LOW (ref >60)
GFR calc non Af Amer: 45 mL/min — ABNORMAL LOW (ref >60)
GLUCOSE: 131 mg/dL — AB (ref 65–99)
POTASSIUM: 3.8 mmol/L (ref 3.5–5.1)
Sodium: 138 mmol/L (ref 135–145)

## 2015-07-31 LAB — HEMOGLOBIN A1C
HEMOGLOBIN A1C: 5.7 % — AB (ref 4.8–5.6)
Mean Plasma Glucose: 117 mg/dL

## 2015-08-02 ENCOUNTER — Other Ambulatory Visit: Payer: Self-pay | Admitting: *Deleted

## 2015-08-02 MED ORDER — OXYCODONE HCL 15 MG PO TABS: ORAL_TABLET | ORAL | Status: DC

## 2015-08-02 NOTE — Telephone Encounter (Signed)
[facility] 

## 2015-08-13 ENCOUNTER — Encounter (HOSPITAL_COMMUNITY)
Admission: RE | Admit: 2015-08-13 | Discharge: 2015-08-13 | Disposition: A | Discharge status: Home / Self Care | Payer: Medicaid Other | Source: Skilled Nursing Facility | Attending: Internal Medicine | Admitting: Internal Medicine

## 2015-08-13 DIAGNOSIS — N189 Chronic kidney disease, unspecified: Secondary | ICD-10-CM | POA: Diagnosis not present

## 2015-08-13 LAB — HEPATIC FUNCTION PANEL
ALBUMIN: 4.4 g/dL (ref 3.5–5.0)
ALT: 13 U/L — AB (ref 17–63)
AST: 16 U/L (ref 15–41)
Alkaline Phosphatase: 63 U/L (ref 38–126)
Bilirubin, Direct: 0.1 mg/dL (ref 0.1–0.5)
Indirect Bilirubin: 0.4 mg/dL (ref 0.3–0.9)
TOTAL PROTEIN: 7.4 g/dL (ref 6.5–8.1)
Total Bilirubin: 0.5 mg/dL (ref 0.3–1.2)

## 2015-08-13 LAB — LIPID PANEL
Cholesterol: 114 mg/dL (ref 0–200)
HDL: 27 mg/dL — ABNORMAL LOW (ref >40)
LDL CALC: 36 mg/dL (ref 0–99)
TRIGLYCERIDES: 254 mg/dL — AB (ref <150)
Total CHOL/HDL Ratio: 4.2 RATIO
VLDL: 51 mg/dL — AB (ref 0–40)

## 2015-08-16 ENCOUNTER — Encounter (HOSPITAL_COMMUNITY)
Admission: AD | Admit: 2015-08-16 | Discharge: 2015-08-16 | Disposition: A | Discharge status: Home / Self Care | Payer: Medicaid Other | Source: Skilled Nursing Facility | Attending: Internal Medicine | Admitting: Internal Medicine

## 2015-08-16 DIAGNOSIS — N189 Chronic kidney disease, unspecified: Secondary | ICD-10-CM | POA: Diagnosis not present

## 2015-08-16 LAB — BASIC METABOLIC PANEL
Anion gap: 9 (ref 5–15)
BUN: 24 mg/dL — ABNORMAL HIGH (ref 6–20)
CALCIUM: 9.1 mg/dL (ref 8.9–10.3)
CO2: 32 mmol/L (ref 22–32)
CREATININE: 1.5 mg/dL — AB (ref 0.61–1.24)
Chloride: 97 mmol/L — ABNORMAL LOW (ref 101–111)
GFR calc non Af Amer: 50 mL/min — ABNORMAL LOW (ref >60)
GFR, EST AFRICAN AMERICAN: 58 mL/min — AB (ref >60)
Glucose, Bld: 185 mg/dL — ABNORMAL HIGH (ref 65–99)
Potassium: 3.9 mmol/L (ref 3.5–5.1)
SODIUM: 138 mmol/L (ref 135–145)

## 2015-08-17 ENCOUNTER — Ambulatory Visit (HOSPITAL_COMMUNITY)
Admission: RE | Admit: 2015-08-17 | Discharge: 2015-08-17 | Disposition: A | Discharge status: Home / Self Care | Payer: Medicaid Other | Source: Ambulatory Visit | Attending: Vascular Surgery | Admitting: Vascular Surgery

## 2015-08-17 ENCOUNTER — Other Ambulatory Visit: Payer: Self-pay | Admitting: *Deleted

## 2015-08-17 ENCOUNTER — Encounter (HOSPITAL_COMMUNITY)
Admission: RE | Disposition: A | Discharge status: Home / Self Care | Payer: Self-pay | Source: Ambulatory Visit | Attending: Vascular Surgery

## 2015-08-17 ENCOUNTER — Telehealth: Payer: Self-pay | Admitting: Vascular Surgery

## 2015-08-17 DIAGNOSIS — L97829 Non-pressure chronic ulcer of other part of left lower leg with unspecified severity: Secondary | ICD-10-CM | POA: Diagnosis not present

## 2015-08-17 DIAGNOSIS — I70209 Unspecified atherosclerosis of native arteries of extremities, unspecified extremity: Secondary | ICD-10-CM

## 2015-08-17 DIAGNOSIS — I2510 Atherosclerotic heart disease of native coronary artery without angina pectoris: Secondary | ICD-10-CM | POA: Insufficient documentation

## 2015-08-17 DIAGNOSIS — D649 Anemia, unspecified: Secondary | ICD-10-CM | POA: Insufficient documentation

## 2015-08-17 DIAGNOSIS — L97529 Non-pressure chronic ulcer of other part of left foot with unspecified severity: Secondary | ICD-10-CM | POA: Insufficient documentation

## 2015-08-17 DIAGNOSIS — M19011 Primary osteoarthritis, right shoulder: Secondary | ICD-10-CM | POA: Insufficient documentation

## 2015-08-17 DIAGNOSIS — E785 Hyperlipidemia, unspecified: Secondary | ICD-10-CM | POA: Insufficient documentation

## 2015-08-17 DIAGNOSIS — Z6830 Body mass index (BMI) 30.0-30.9, adult: Secondary | ICD-10-CM | POA: Diagnosis not present

## 2015-08-17 DIAGNOSIS — I701 Atherosclerosis of renal artery: Secondary | ICD-10-CM | POA: Diagnosis not present

## 2015-08-17 DIAGNOSIS — I70245 Atherosclerosis of native arteries of left leg with ulceration of other part of foot: Secondary | ICD-10-CM | POA: Diagnosis not present

## 2015-08-17 DIAGNOSIS — Z8249 Family history of ischemic heart disease and other diseases of the circulatory system: Secondary | ICD-10-CM | POA: Diagnosis not present

## 2015-08-17 DIAGNOSIS — I714 Abdominal aortic aneurysm, without rupture: Secondary | ICD-10-CM | POA: Insufficient documentation

## 2015-08-17 DIAGNOSIS — I70248 Atherosclerosis of native arteries of left leg with ulceration of other part of lower left leg: Secondary | ICD-10-CM | POA: Insufficient documentation

## 2015-08-17 DIAGNOSIS — J449 Chronic obstructive pulmonary disease, unspecified: Secondary | ICD-10-CM | POA: Insufficient documentation

## 2015-08-17 DIAGNOSIS — J45909 Unspecified asthma, uncomplicated: Secondary | ICD-10-CM | POA: Insufficient documentation

## 2015-08-17 DIAGNOSIS — Z89611 Acquired absence of right leg above knee: Secondary | ICD-10-CM | POA: Diagnosis not present

## 2015-08-17 DIAGNOSIS — I509 Heart failure, unspecified: Secondary | ICD-10-CM | POA: Diagnosis not present

## 2015-08-17 DIAGNOSIS — M19012 Primary osteoarthritis, left shoulder: Secondary | ICD-10-CM | POA: Diagnosis not present

## 2015-08-17 DIAGNOSIS — Z01810 Encounter for preprocedural cardiovascular examination: Secondary | ICD-10-CM

## 2015-08-17 DIAGNOSIS — I4891 Unspecified atrial fibrillation: Secondary | ICD-10-CM | POA: Diagnosis not present

## 2015-08-17 DIAGNOSIS — F17210 Nicotine dependence, cigarettes, uncomplicated: Secondary | ICD-10-CM | POA: Insufficient documentation

## 2015-08-17 DIAGNOSIS — I110 Hypertensive heart disease with heart failure: Secondary | ICD-10-CM | POA: Insufficient documentation

## 2015-08-17 DIAGNOSIS — I70338 Atherosclerosis of unspecified type of bypass graft(s) of the right leg with ulceration of other part of lower leg: Secondary | ICD-10-CM | POA: Diagnosis not present

## 2015-08-17 DIAGNOSIS — Z95820 Peripheral vascular angioplasty status with implants and grafts: Secondary | ICD-10-CM | POA: Diagnosis not present

## 2015-08-17 DIAGNOSIS — I6523 Occlusion and stenosis of bilateral carotid arteries: Secondary | ICD-10-CM | POA: Diagnosis not present

## 2015-08-17 DIAGNOSIS — E669 Obesity, unspecified: Secondary | ICD-10-CM | POA: Insufficient documentation

## 2015-08-17 DIAGNOSIS — Z8614 Personal history of Methicillin resistant Staphylococcus aureus infection: Secondary | ICD-10-CM | POA: Diagnosis not present

## 2015-08-17 HISTORY — PX: PERIPHERAL VASCULAR CATHETERIZATION: SHX172C

## 2015-08-17 LAB — POCT I-STAT, CHEM 8
BUN: 27 mg/dL — ABNORMAL HIGH (ref 6–20)
Calcium, Ion: 1.19 mmol/L (ref 1.12–1.23)
Chloride: 98 mmol/L — ABNORMAL LOW (ref 101–111)
Creatinine, Ser: 1.4 mg/dL — ABNORMAL HIGH (ref 0.61–1.24)
Glucose, Bld: 117 mg/dL — ABNORMAL HIGH (ref 65–99)
HEMATOCRIT: 48 % (ref 39.0–52.0)
HEMOGLOBIN: 16.3 g/dL (ref 13.0–17.0)
Potassium: 4.1 mmol/L (ref 3.5–5.1)
SODIUM: 140 mmol/L (ref 135–145)
TCO2: 30 mmol/L (ref 0–100)

## 2015-08-17 SURGERY — ABDOMINAL AORTOGRAM
Anesthesia: LOCAL

## 2015-08-17 MED ORDER — LIDOCAINE HCL (PF) 1 % IJ SOLN
INTRAMUSCULAR | Status: DC | PRN
  Administered 2015-08-17: 10:00:00

## 2015-08-17 MED ORDER — SODIUM CHLORIDE 0.45 % IV SOLN
INTRAVENOUS | Status: DC
  Administered 2015-08-17: 75 mL/h via INTRAVENOUS

## 2015-08-17 MED ORDER — SODIUM CHLORIDE 0.9 % IV SOLN
INTRAVENOUS | Status: DC
  Administered 2015-08-17: 1000 mL via INTRAVENOUS

## 2015-08-17 MED ORDER — IODIXANOL 320 MG/ML IV SOLN
INTRAVENOUS | Status: DC | PRN
  Administered 2015-08-17: 117 mL via INTRAVENOUS

## 2015-08-17 MED ORDER — OXYCODONE HCL 5 MG PO TABS
5.0000 mg | ORAL_TABLET | ORAL | Status: DC | PRN
  Administered 2015-08-17: 5 mg via ORAL

## 2015-08-17 SURGICAL SUPPLY — 9 items
CATH ANGIO 5F PIGTAIL 65CM (CATHETERS) ×2 IMPLANT
COVER PRB 48X5XTLSCP FOLD TPE (BAG) ×1 IMPLANT
COVER PROBE 5X48 (BAG) ×1
KIT PV (KITS) ×2 IMPLANT
SHEATH PINNACLE 5F 10CM (SHEATH) ×2 IMPLANT
SYR MEDRAD MARK V 150ML (SYRINGE) ×2 IMPLANT
TRANSDUCER W/STOPCOCK (MISCELLANEOUS) ×2 IMPLANT
TRAY PV CATH (CUSTOM PROCEDURE TRAY) ×2 IMPLANT
WIRE HITORQ VERSACORE ST 145CM (WIRE) ×2 IMPLANT

## 2015-08-17 NOTE — Progress Notes (Signed)
Site area: Right groin a 5 french arterial sheath was removed  Site Prior to Removal:  Level 0  Pressure Applied For 15 MINUTES    Minutes Beginning at 1010a  Manual:   Yes.    Patient Status During Pull:  stable  Post Pull Groin Site:  Level 0  Post Pull Instructions Given:  Yes.    Post Pull Pulses Present:  Yes.    Dressing Applied:  Yes.    Comments:  VS remaibn stable during sheath pull.
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
## 2015-08-17 NOTE — Op Note (Addendum)
Procedure: Abdominal aortogram with left lower extremity runoff limited right lower extremity runoff  Preoperative diagnosis: Nonhealing wound left foot postoperative diagnosis: Same  Anesthesia: Local  Operative findings: #1 patent aortoiliac system including left external iliac artery stent #2 multi segment 70% stenosis left common femoral artery #3 patent lower extremity runoff two vessels peroneal and posterior tibial  Operative details: After obtaining informed consent, the patient is a the [place] lab. The patient was placed in supine position on the Angio table. Both groins were prepped and draped in usual sterile fashion. Local anesthesia was demonstrated over the right common femoral artery. Ultrasound was used to identify the right common femoral artery. There was some calcified plaque on the anterior wall of the right common femoral artery. I was able to cannulate this under ultrasound guidance. However the guidewire would not initially pass. With some manipulation on the left was able to get the guidewire to pass up in the abdominal aorta. Next a 5 French sheath was placed over the guidewire in the right common femoral artery. This was thoroughly flushed with heparinized saline. A 5 French pigtail catheter was then placed over the guidewire up in the abdominal aorta. Abdominal aortogram was obtained in AP projection. Right renal artery is patent. There is a 50% mid left renal artery stenosis area the infrarenal abdominal aorta is patent. The left and right common external and internal iliac arteries are patent. There is an external iliac artery stent which is widely patent. The proximal right common femoral artery is patent. There is a 70% stenosis of the left common femoral artery at the level of the inguinal ligament.  Next lower extremity runoff views obtained in the left lower extremity. Left common femoral artery has 3 segments of 70% stenosis. The left superficial femoral artery is diffusely  diseased with multiple segments of 50-25% stenosis but patent throughout its course. The popliteal artery is patent. The anterior tibial artery is occluded. There is two-vessel runoff via the peroneal and posterior tibial arteries.  At this point the pigtail catheter was pulled back over a guidewire. The 5 French sheath was thoroughly flushed with heparin saline. The patient was taken to the holding area to have the sheath pulled in the holding area. The patient tolerated procedure well and there were complications. Patient was taken to the holding area in stable condition.  Operative management: The patient will be scheduled for left common femoral endarterectomy in the near future after cardiac stress testing.  Ruta Hinds, MD [facility] [place] Office: [phone] Pager: [phone]

## 2015-08-17 NOTE — Discharge Instructions (Signed)
Angiogram, Care After °Refer to this sheet in the next few weeks. These instructions provide you with information about caring for yourself after your procedure. Your health care provider may also give you more specific instructions. Your treatment has been planned according to current medical practices, but problems sometimes occur. Call your health care provider if you have any problems or questions after your procedure. °WHAT TO EXPECT AFTER THE PROCEDURE °After your procedure, it is typical to have the following: °· Bruising at the catheter insertion site that usually fades within 1-2 weeks. °· Blood collecting in the tissue (hematoma) that may be painful to the touch. It should usually decrease in size and tenderness within 1-2 weeks. °HOME CARE INSTRUCTIONS °· Take medicines only as directed by your health care provider. °· You may shower 24-48 hours after the procedure or as directed by your health care provider. Remove the bandage (dressing) and gently wash the site with plain soap and water. Pat the area dry with a clean towel. Do not rub the site, because this may cause bleeding. °· Do not take baths, swim, or use a hot tub until your health care provider approves. °· Check your insertion site every day for redness, swelling, or drainage. °· Do not apply powder or lotion to the site. °· Do not lift over 10 lb (4.5 kg) for 5 days after your procedure or as directed by your health care provider. °· Ask your health care provider when it is okay to: °¨ Return to work or school. °¨ Resume usual physical activities or sports. °¨ Resume sexual activity. °· Do not drive home if you are discharged the same day as the procedure. Have someone else drive you. °· You may drive 24 hours after the procedure unless otherwise instructed by your health care provider. °· Do not operate machinery or power tools for 24 hours after the procedure or as directed by your health care provider. °· If your procedure was done as an  outpatient procedure, which means that you went home the same day as your procedure, a responsible adult should be with you for the first 24 hours after you arrive home. °· Keep all follow-up visits as directed by your health care provider. This is important. °SEEK MEDICAL CARE IF: °· You have a fever. °· You have chills. °· You have increased bleeding from the catheter insertion site. Hold pressure on the site. °SEEK IMMEDIATE MEDICAL CARE IF: °· You have unusual pain at the catheter insertion site. °· You have redness, warmth, or swelling at the catheter insertion site. °· You have drainage (other than a small amount of blood on the dressing) from the catheter insertion site. °· The catheter insertion site is bleeding, and the bleeding does not stop after 30 minutes of holding steady pressure on the site. °· The area near or just beyond the catheter insertion site becomes pale, cool, tingly, or numb. °  °This information is not intended to replace advice given to you by your health care provider. Make sure you discuss any questions you have with your health care provider. °  °Document Released: 01/30/2005 Document Revised: 08/04/2014 Document Reviewed: 12/15/2012 °Elsevier Interactive Patient Education ©2016 [employer] ° °

## 2015-08-17 NOTE — Telephone Encounter (Signed)
Spoke with [facility] to give information regarding stress test scheduled 08/27/15 @ 10:00am [address], [place]

## 2015-08-17 NOTE — Interval H&P Note (Signed)
History and Physical Interval Note:  08/17/2015 9:10 AM  Edwin [name]  has presented today for surgery, with the diagnosis of LLE ulcer  The various methods of treatment have been discussed with the patient and family. After consideration of risks, benefits and other options for treatment, the patient has consented to  Procedure(s): Abdominal Aortogram (N/A) as a surgical intervention .  The patient's history has been reviewed, patient examined, no change in status, stable for surgery.  I have reviewed the patient's chart and labs.  Questions were answered to the patient's satisfaction.     Ruta Hinds

## 2015-08-17 NOTE — Telephone Encounter (Signed)
-----   Message from Mena Goes, RN sent at 08/17/2015 12:14 PM EST ----- Regarding: Myoview scheduling Please schedule this Lexiscan, No cardiology referral. Thanks ----- Message -----    From: Elam Dutch, MD    Sent: 08/17/2015  11:59 AM      To: Mena Goes, RN Subject: RE: surgery schedule                           myoview ----- Message -----    From: Mena Goes, RN    Sent: 08/17/2015  11:35 AM      To: Elam Dutch, MD, Gregery Na, RN Subject: FW: surgery schedule                           Cardiology clearance referral or just send patient for Myoview with you being responsible?  ----- Message -----    From: Gregery Na, RN    Sent: 08/17/2015  10:39 AM      To: Mena Goes, RN Subject: FW: surgery schedule                             ----- Message -----    From: Mena Goes, RN    Sent: 08/17/2015  10:06 AM      To: Gregery Na, RN Subject: surgery schedule                                 ----- Message -----    From: Elam Dutch, MD    Sent: 08/17/2015   9:40 AM      To: Vvs Charge Pool  Aortogram with bilat runoff He needs a left femoral endarterectomy See if you can get it on the schedule in 2 weeks He needs a stress test prior  Ruta Hinds

## 2015-08-20 ENCOUNTER — Encounter (HOSPITAL_COMMUNITY): Payer: Self-pay | Admitting: Vascular Surgery

## 2015-08-21 ENCOUNTER — Other Ambulatory Visit: Payer: Self-pay

## 2015-08-22 ENCOUNTER — Telehealth (HOSPITAL_COMMUNITY): Payer: Self-pay | Admitting: *Deleted

## 2015-08-22 NOTE — Telephone Encounter (Signed)
Called Nursing Home to give instructions for upcoming test. Instructions faxed to [facility] @ [phone] per request. Hubbard Robinson, RN

## 2015-08-27 ENCOUNTER — Ambulatory Visit (HOSPITAL_COMMUNITY): Payer: Medicaid Other | Attending: Internal Medicine

## 2015-08-27 DIAGNOSIS — Z01810 Encounter for preprocedural cardiovascular examination: Secondary | ICD-10-CM | POA: Diagnosis not present

## 2015-08-27 DIAGNOSIS — F17200 Nicotine dependence, unspecified, uncomplicated: Secondary | ICD-10-CM | POA: Diagnosis not present

## 2015-08-27 DIAGNOSIS — R9439 Abnormal result of other cardiovascular function study: Secondary | ICD-10-CM | POA: Insufficient documentation

## 2015-08-27 DIAGNOSIS — I779 Disorder of arteries and arterioles, unspecified: Secondary | ICD-10-CM | POA: Diagnosis not present

## 2015-08-27 DIAGNOSIS — I70209 Unspecified atherosclerosis of native arteries of extremities, unspecified extremity: Secondary | ICD-10-CM | POA: Insufficient documentation

## 2015-08-27 DIAGNOSIS — I10 Essential (primary) hypertension: Secondary | ICD-10-CM | POA: Insufficient documentation

## 2015-08-27 LAB — MYOCARDIAL PERFUSION IMAGING
CHL CUP NUCLEAR SSS: 10
CSEPPHR: 67 {beats}/min
LHR: 0.34
LVDIAVOL: 159 mL
LVSYSVOL: 83 mL
NUC STRESS TID: 0.99
Rest HR: 53 {beats}/min
SDS: 6
SRS: 4

## 2015-08-27 MED ORDER — TECHNETIUM TC 99M SESTAMIBI GENERIC - CARDIOLITE
30.6000 | Freq: Once | INTRAVENOUS | Status: AC | PRN
  Administered 2015-08-27: 31 via INTRAVENOUS

## 2015-08-27 MED ORDER — TECHNETIUM TC 99M SESTAMIBI GENERIC - CARDIOLITE
10.6000 | Freq: Once | INTRAVENOUS | Status: AC | PRN
  Administered 2015-08-27: 11 via INTRAVENOUS

## 2015-08-27 MED ORDER — REGADENOSON 0.4 MG/5ML IV SOLN
0.4000 mg | Freq: Once | INTRAVENOUS | Status: AC
Start: 2015-08-27 — End: 2015-08-27
  Administered 2015-08-27: 0.4 mg via INTRAVENOUS

## 2015-08-28 NOTE — Pre-Procedure Instructions (Signed)
    Edwin [name]  08/28/2015     No Pharmacies Listed   Your procedure is scheduled on 09/03/15.  Report to [facility] Admitting at 530 A.M.  Call this number if you have problems the morning of surgery:  [phone]   Remember:  Do not eat food or drink liquids after midnight.  Take these medicines the morning of surgery with A SIP OF WATER --all inhalers,clonidine,colchicine,lopressor,oxycodone,protonix   Do not wear jewelry, make-up or nail polish.  Do not wear lotions, powders, or perfumes.  You may wear deodorant.  Do not shave 48 hours prior to surgery.  Men may shave face and neck.  Do not bring valuables to the hospital.  [facility] is not responsible for any belongings or valuables.  Contacts, dentures or bridgework may not be worn into surgery.  Leave your suitcase in the car.  After surgery it may be brought to your room.  For patients admitted to the hospital, discharge time will be determined by your treatment team.  Patients discharged the day of surgery will not be allowed to drive home.   Name and phone number of your driver:    Special instructions:    Please read over the following fact sheets that you were given. Pain Booklet, Coughing and Deep Breathing, Blood Transfusion Information, MRSA Information and Surgical Site Infection Prevention

## 2015-08-29 ENCOUNTER — Encounter (HOSPITAL_COMMUNITY)
Admission: RE | Admit: 2015-08-29 | Discharge: 2015-08-29 | Disposition: A | Discharge status: Home / Self Care | Payer: Medicaid Other | Source: Ambulatory Visit | Attending: Vascular Surgery | Admitting: Vascular Surgery

## 2015-08-29 ENCOUNTER — Encounter (HOSPITAL_COMMUNITY): Payer: Self-pay

## 2015-08-29 DIAGNOSIS — Z01812 Encounter for preprocedural laboratory examination: Secondary | ICD-10-CM | POA: Diagnosis not present

## 2015-08-29 DIAGNOSIS — Z0183 Encounter for blood typing: Secondary | ICD-10-CM | POA: Insufficient documentation

## 2015-08-29 LAB — COMPREHENSIVE METABOLIC PANEL
ALBUMIN: 3.8 g/dL (ref 3.5–5.0)
ALK PHOS: 63 U/L (ref 38–126)
ALT: 15 U/L — AB (ref 17–63)
AST: 19 U/L (ref 15–41)
Anion gap: 11 (ref 5–15)
BILIRUBIN TOTAL: 0.3 mg/dL (ref 0.3–1.2)
BUN: 19 mg/dL (ref 6–20)
CALCIUM: 9.1 mg/dL (ref 8.9–10.3)
CO2: 28 mmol/L (ref 22–32)
CREATININE: 1.56 mg/dL — AB (ref 0.61–1.24)
Chloride: 99 mmol/L — ABNORMAL LOW (ref 101–111)
GFR calc Af Amer: 55 mL/min — ABNORMAL LOW (ref >60)
GFR, EST NON AFRICAN AMERICAN: 47 mL/min — AB (ref >60)
GLUCOSE: 142 mg/dL — AB (ref 65–99)
POTASSIUM: 4.2 mmol/L (ref 3.5–5.1)
Sodium: 138 mmol/L (ref 135–145)
TOTAL PROTEIN: 6.7 g/dL (ref 6.5–8.1)

## 2015-08-29 LAB — URINE MICROSCOPIC-ADD ON
BACTERIA UA: NONE SEEN
RBC / HPF: NONE SEEN RBC/hpf (ref 0–5)

## 2015-08-29 LAB — TYPE AND SCREEN
ABO/RH(D): A POS
ANTIBODY SCREEN: NEGATIVE

## 2015-08-29 LAB — SURGICAL PCR SCREEN
MRSA, PCR: NEGATIVE
STAPHYLOCOCCUS AUREUS: NEGATIVE

## 2015-08-29 LAB — URINALYSIS, ROUTINE W REFLEX MICROSCOPIC
BILIRUBIN URINE: NEGATIVE
Glucose, UA: NEGATIVE mg/dL
Hgb urine dipstick: NEGATIVE
Ketones, ur: NEGATIVE mg/dL
LEUKOCYTES UA: NEGATIVE
NITRITE: NEGATIVE
PH: 6.5 (ref 5.0–8.0)
Protein, ur: 100 mg/dL — AB
SPECIFIC GRAVITY, URINE: 1.015 (ref 1.005–1.030)

## 2015-08-29 LAB — CBC
HEMATOCRIT: 47.4 % (ref 39.0–52.0)
HEMOGLOBIN: 15.6 g/dL (ref 13.0–17.0)
MCH: 30.5 pg (ref 26.0–34.0)
MCHC: 32.9 g/dL (ref 30.0–36.0)
MCV: 92.8 fL (ref 78.0–100.0)
Platelets: 190 10*3/uL (ref 150–400)
RBC: 5.11 MIL/uL (ref 4.22–5.81)
RDW: 14.8 % (ref 11.5–15.5)
WBC: 8.9 10*3/uL (ref 4.0–10.5)

## 2015-08-29 LAB — PROTIME-INR
INR: 1.03 (ref 0.00–1.49)
PROTHROMBIN TIME: 13.7 s (ref 11.6–15.2)

## 2015-08-29 LAB — APTT: aPTT: 30 seconds (ref 24–37)

## 2015-08-29 MED ORDER — CHLORHEXIDINE GLUCONATE CLOTH 2 % EX PADS: 6.0000 | MEDICATED_PAD | Freq: Once | CUTANEOUS | Status: DC

## 2015-08-31 ENCOUNTER — Encounter: Payer: Self-pay | Admitting: Physician Assistant

## 2015-08-31 ENCOUNTER — Encounter (HOSPITAL_COMMUNITY)
Admission: AD | Admit: 2015-08-31 | Discharge: 2015-08-31 | Disposition: A | Discharge status: Home / Self Care | Payer: Medicaid Other | Source: Skilled Nursing Facility | Attending: Internal Medicine | Admitting: Internal Medicine

## 2015-08-31 ENCOUNTER — Encounter: Payer: Self-pay | Admitting: *Deleted

## 2015-08-31 ENCOUNTER — Ambulatory Visit (INDEPENDENT_AMBULATORY_CARE_PROVIDER_SITE_OTHER): Payer: Medicaid Other | Admitting: Physician Assistant

## 2015-08-31 VITALS — BP 144/78 | HR 62 | Ht 68.0 in | Wt 206.8 lb

## 2015-08-31 DIAGNOSIS — Z89511 Acquired absence of right leg below knee: Secondary | ICD-10-CM | POA: Diagnosis not present

## 2015-08-31 DIAGNOSIS — D649 Anemia, unspecified: Secondary | ICD-10-CM | POA: Insufficient documentation

## 2015-08-31 DIAGNOSIS — Z720 Tobacco use: Secondary | ICD-10-CM | POA: Diagnosis not present

## 2015-08-31 DIAGNOSIS — J449 Chronic obstructive pulmonary disease, unspecified: Secondary | ICD-10-CM | POA: Diagnosis not present

## 2015-08-31 DIAGNOSIS — I739 Peripheral vascular disease, unspecified: Secondary | ICD-10-CM | POA: Diagnosis not present

## 2015-08-31 DIAGNOSIS — Z01810 Encounter for preprocedural cardiovascular examination: Secondary | ICD-10-CM | POA: Diagnosis not present

## 2015-08-31 DIAGNOSIS — R9439 Abnormal result of other cardiovascular function study: Secondary | ICD-10-CM | POA: Diagnosis not present

## 2015-08-31 LAB — BASIC METABOLIC PANEL
Anion gap: 10 (ref 5–15)
BUN: 22 mg/dL — AB (ref 6–20)
CHLORIDE: 97 mmol/L — AB (ref 101–111)
CO2: 33 mmol/L — ABNORMAL HIGH (ref 22–32)
CREATININE: 1.54 mg/dL — AB (ref 0.61–1.24)
Calcium: 9.6 mg/dL (ref 8.9–10.3)
GFR calc Af Amer: 56 mL/min — ABNORMAL LOW (ref >60)
GFR calc non Af Amer: 48 mL/min — ABNORMAL LOW (ref >60)
GLUCOSE: 85 mg/dL (ref 65–99)
Potassium: 3.9 mmol/L (ref 3.5–5.1)
SODIUM: 140 mmol/L (ref 135–145)

## 2015-08-31 NOTE — Patient Instructions (Signed)
Medication Instructions:  Your physician recommends that you continue on your current medications as directed. Please refer to the Current Medication list given to you today.   Labwork: NONE ORDERED  Testing/Procedures: Your physician has requested that you have a cardiac catheterization. Cardiac catheterization is used to diagnose and/or treat various heart conditions. Doctors may recommend this procedure for a number of different reasons. The most common reason is to evaluate chest pain. Chest pain can be a symptom of coronary artery disease (CAD), and cardiac catheterization can show whether plaque is narrowing or blocking your heart's arteries. This procedure is also used to evaluate the valves, as well as measure the blood flow and oxygen levels in different parts of your heart. For further information please visit [email or web address]. Please follow instruction sheet, as given.    Follow-Up: Your physician recommends that you schedule a follow-up appointment:  IT WILL BE BASED UPON YOUR HEART CATHERIZATION  Any Other Special Instructions Will Be Listed Below (If Applicable).  Coronary Angiogram A coronary angiogram, also called coronary angiography, is an X-ray procedure used to look at the arteries in the heart. In this procedure, a dye (contrast dye) is injected through a long, hollow tube (catheter). The catheter is about the size of a piece of cooked spaghetti and is inserted through your groin, wrist, or arm. The dye is injected into each artery, and X-rays are then taken to show if there is a blockage in the arteries of your heart. LET [facility] CARE PROVIDER KNOW ABOUT:  Any allergies you have, including allergies to shellfish or contrast dye.   All medicines you are taking, including vitamins, herbs, eye drops, creams, and over-the-counter medicines.   Previous problems you or members of your family have had with the use of anesthetics.   Any blood disorders you have.    Previous surgeries you have had.  History of kidney problems or failure.   Other medical conditions you have. RISKS AND COMPLICATIONS  Generally, a coronary angiogram is a safe procedure. However, problems can occur and include:  Allergic reaction to the dye.  Bleeding from the access site or other locations.  Kidney injury, especially in people with impaired kidney function.  Stroke (rare).  Heart attack (rare). BEFORE THE PROCEDURE   Do not eat or drink anything after midnight the night before the procedure or as directed by your health care provider.   Ask your health care provider about changing or stopping your regular medicines. This is especially important if you are taking diabetes medicines or blood thinners. PROCEDURE  You may be given a medicine to help you relax (sedative) before the procedure. This medicine is given through an intravenous (IV) access tube that is inserted into one of your veins.   The area where the catheter will be inserted will be washed and shaved. This is usually done in the groin but may be done in the fold of your arm (near your elbow) or in the wrist.   A medicine will be given to numb the area where the catheter will be inserted (local anesthetic).   The health care provider will insert the catheter into an artery. The catheter will be guided by using a special type of X-ray (fluoroscopy) of the blood vessel being examined.   A special dye will then be injected into the catheter, and X-rays will be taken. The dye will help to show where any narrowing or blockages are located in the heart arteries.  AFTER THE PROCEDURE  If the procedure is done through the leg, you will be kept in bed lying flat for several hours. You will be instructed to not bend or cross your legs.  The insertion site will be checked frequently.   The pulse in your feet or wrist will be checked frequently.   Additional blood tests, X-rays, and an  electrocardiogram may be done.    This information is not intended to replace advice given to you by your health care provider. Make sure you discuss any questions you have with your health care provider.   Document Released: 01/18/2003 Document Revised: 08/04/2014 Document Reviewed: 12/06/2012 Elsevier Interactive Patient Education [employer].    If you need a refill on your cardiac medications before your next appointment, please call your pharmacy.
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
## 2015-09-03 ENCOUNTER — Encounter (HOSPITAL_COMMUNITY): Payer: Self-pay | Admitting: General Practice

## 2015-09-03 ENCOUNTER — Ambulatory Visit (HOSPITAL_COMMUNITY)
Admission: RE | Admit: 2015-09-03 | Discharge: 2015-09-04 | Disposition: A | Discharge status: Home / Self Care | Payer: Medicaid Other | Source: Ambulatory Visit | Attending: Cardiology | Admitting: Cardiology

## 2015-09-03 ENCOUNTER — Encounter (HOSPITAL_COMMUNITY)
Admission: RE | Disposition: A | Discharge status: Home / Self Care | Payer: Self-pay | Source: Ambulatory Visit | Attending: Cardiology

## 2015-09-03 ENCOUNTER — Other Ambulatory Visit: Payer: Self-pay

## 2015-09-03 DIAGNOSIS — J449 Chronic obstructive pulmonary disease, unspecified: Secondary | ICD-10-CM | POA: Diagnosis not present

## 2015-09-03 DIAGNOSIS — N189 Chronic kidney disease, unspecified: Secondary | ICD-10-CM | POA: Insufficient documentation

## 2015-09-03 DIAGNOSIS — Z7951 Long term (current) use of inhaled steroids: Secondary | ICD-10-CM | POA: Diagnosis not present

## 2015-09-03 DIAGNOSIS — I2510 Atherosclerotic heart disease of native coronary artery without angina pectoris: Secondary | ICD-10-CM | POA: Diagnosis present

## 2015-09-03 DIAGNOSIS — J45909 Unspecified asthma, uncomplicated: Secondary | ICD-10-CM | POA: Diagnosis not present

## 2015-09-03 DIAGNOSIS — I2582 Chronic total occlusion of coronary artery: Secondary | ICD-10-CM | POA: Insufficient documentation

## 2015-09-03 DIAGNOSIS — Z955 Presence of coronary angioplasty implant and graft: Secondary | ICD-10-CM | POA: Insufficient documentation

## 2015-09-03 DIAGNOSIS — Z993 Dependence on wheelchair: Secondary | ICD-10-CM | POA: Insufficient documentation

## 2015-09-03 DIAGNOSIS — Z95820 Peripheral vascular angioplasty status with implants and grafts: Secondary | ICD-10-CM | POA: Diagnosis not present

## 2015-09-03 DIAGNOSIS — I252 Old myocardial infarction: Secondary | ICD-10-CM | POA: Diagnosis not present

## 2015-09-03 DIAGNOSIS — E785 Hyperlipidemia, unspecified: Secondary | ICD-10-CM | POA: Diagnosis not present

## 2015-09-03 DIAGNOSIS — I509 Heart failure, unspecified: Secondary | ICD-10-CM | POA: Diagnosis not present

## 2015-09-03 DIAGNOSIS — I6529 Occlusion and stenosis of unspecified carotid artery: Secondary | ICD-10-CM | POA: Diagnosis not present

## 2015-09-03 DIAGNOSIS — I130 Hypertensive heart and chronic kidney disease with heart failure and stage 1 through stage 4 chronic kidney disease, or unspecified chronic kidney disease: Secondary | ICD-10-CM | POA: Diagnosis not present

## 2015-09-03 DIAGNOSIS — I4891 Unspecified atrial fibrillation: Secondary | ICD-10-CM | POA: Insufficient documentation

## 2015-09-03 DIAGNOSIS — I739 Peripheral vascular disease, unspecified: Secondary | ICD-10-CM | POA: Diagnosis present

## 2015-09-03 DIAGNOSIS — Z89611 Acquired absence of right leg above knee: Secondary | ICD-10-CM | POA: Insufficient documentation

## 2015-09-03 DIAGNOSIS — F17210 Nicotine dependence, cigarettes, uncomplicated: Secondary | ICD-10-CM | POA: Diagnosis not present

## 2015-09-03 DIAGNOSIS — R9439 Abnormal result of other cardiovascular function study: Secondary | ICD-10-CM | POA: Diagnosis present

## 2015-09-03 DIAGNOSIS — D649 Anemia, unspecified: Secondary | ICD-10-CM | POA: Diagnosis not present

## 2015-09-03 DIAGNOSIS — I714 Abdominal aortic aneurysm, without rupture: Secondary | ICD-10-CM | POA: Insufficient documentation

## 2015-09-03 DIAGNOSIS — Z7902 Long term (current) use of antithrombotics/antiplatelets: Secondary | ICD-10-CM | POA: Diagnosis not present

## 2015-09-03 DIAGNOSIS — I701 Atherosclerosis of renal artery: Secondary | ICD-10-CM | POA: Diagnosis not present

## 2015-09-03 DIAGNOSIS — M19019 Primary osteoarthritis, unspecified shoulder: Secondary | ICD-10-CM | POA: Diagnosis not present

## 2015-09-03 DIAGNOSIS — I10 Essential (primary) hypertension: Secondary | ICD-10-CM | POA: Diagnosis present

## 2015-09-03 DIAGNOSIS — Z7982 Long term (current) use of aspirin: Secondary | ICD-10-CM | POA: Diagnosis not present

## 2015-09-03 DIAGNOSIS — Z9861 Coronary angioplasty status: Secondary | ICD-10-CM

## 2015-09-03 HISTORY — PX: CARDIAC CATHETERIZATION: SHX172

## 2015-09-03 HISTORY — PX: CORONARY ANGIOPLASTY: SHX604

## 2015-09-03 LAB — POCT ACTIVATED CLOTTING TIME: ACTIVATED CLOTTING TIME: 507 s

## 2015-09-03 SURGERY — LEFT HEART CATH AND CORONARY ANGIOGRAPHY
Anesthesia: LOCAL

## 2015-09-03 MED ORDER — SODIUM CHLORIDE 0.9 % IV SOLN: 250.0000 mL | INTRAVENOUS | Status: DC | PRN

## 2015-09-03 MED ORDER — SODIUM CHLORIDE 0.9% FLUSH: 3.0000 mL | Freq: Two times a day (BID) | INTRAVENOUS | Status: DC

## 2015-09-03 MED ORDER — ISOSORBIDE MONONITRATE ER 30 MG PO TB24
30.0000 mg | ORAL_TABLET | Freq: Every day | ORAL | Status: DC
  Administered 2015-09-03: 30 mg via ORAL
  Filled 2015-09-03: qty 1

## 2015-09-03 MED ORDER — VERAPAMIL HCL 2.5 MG/ML IV SOLN
INTRAVENOUS | Status: DC | PRN
  Administered 2015-09-03: 10 mL via INTRA_ARTERIAL

## 2015-09-03 MED ORDER — IOHEXOL 350 MG/ML SOLN
INTRAVENOUS | Status: DC | PRN
  Administered 2015-09-03: 190 mL via INTRA_ARTERIAL

## 2015-09-03 MED ORDER — VERAPAMIL HCL 2.5 MG/ML IV SOLN
INTRAVENOUS | Status: AC
  Filled 2015-09-03: qty 2

## 2015-09-03 MED ORDER — NITROGLYCERIN 1 MG/10 ML FOR IR/CATH LAB
INTRA_ARTERIAL | Status: DC | PRN
  Administered 2015-09-03 (×2): 200 ug via INTRACORONARY

## 2015-09-03 MED ORDER — CLOPIDOGREL BISULFATE 75 MG PO TABS
75.0000 mg | ORAL_TABLET | Freq: Every day | ORAL | Status: DC
  Administered 2015-09-04: 09:00:00 75 mg via ORAL
  Filled 2015-09-03: qty 1

## 2015-09-03 MED ORDER — HYDRALAZINE HCL 20 MG/ML IJ SOLN
INTRAMUSCULAR | Status: AC
  Filled 2015-09-03: qty 1

## 2015-09-03 MED ORDER — OXYCODONE HCL 5 MG PO TABS
5.0000 mg | ORAL_TABLET | ORAL | Status: DC | PRN
  Filled 2015-09-03: qty 1

## 2015-09-03 MED ORDER — POTASSIUM CHLORIDE CRYS ER 20 MEQ PO TBCR
20.0000 meq | EXTENDED_RELEASE_TABLET | Freq: Every day | ORAL | Status: DC
  Administered 2015-09-04: 09:00:00 20 meq via ORAL
  Filled 2015-09-03 (×2): qty 1

## 2015-09-03 MED ORDER — SODIUM CHLORIDE 0.9 % WEIGHT BASED INFUSION
3.0000 mL/kg/h | INTRAVENOUS | Status: DC
  Administered 2015-09-03: 3 mL/kg/h via INTRAVENOUS

## 2015-09-03 MED ORDER — FENTANYL CITRATE (PF) 100 MCG/2ML IJ SOLN
INTRAMUSCULAR | Status: DC | PRN
  Administered 2015-09-03 (×3): 25 ug via INTRAVENOUS

## 2015-09-03 MED ORDER — TEMAZEPAM 15 MG PO CAPS
30.0000 mg | ORAL_CAPSULE | Freq: Every evening | ORAL | Status: DC | PRN
  Administered 2015-09-04: 30 mg via ORAL
  Filled 2015-09-03: qty 2

## 2015-09-03 MED ORDER — HEPARIN (PORCINE) IN NACL 2-0.9 UNIT/ML-% IJ SOLN
INTRAMUSCULAR | Status: DC | PRN
  Administered 2015-09-03: 1000 mL

## 2015-09-03 MED ORDER — MOMETASONE FURO-FORMOTEROL FUM 100-5 MCG/ACT IN AERO
2.0000 | INHALATION_SPRAY | Freq: Two times a day (BID) | RESPIRATORY_TRACT | Status: DC
  Administered 2015-09-03 – 2015-09-04 (×2): 2 via RESPIRATORY_TRACT
  Filled 2015-09-03: qty 8.8

## 2015-09-03 MED ORDER — SODIUM CHLORIDE 0.9 % WEIGHT BASED INFUSION: 1.0000 mL/kg/h | INTRAVENOUS | Status: DC

## 2015-09-03 MED ORDER — LIDOCAINE HCL (PF) 1 % IJ SOLN
INTRAMUSCULAR | Status: AC
  Filled 2015-09-03: qty 30

## 2015-09-03 MED ORDER — OXYCODONE HCL 5 MG PO TABS
15.0000 mg | ORAL_TABLET | ORAL | Status: DC | PRN
  Administered 2015-09-03 – 2015-09-04 (×4): 15 mg via ORAL
  Filled 2015-09-03 (×4): qty 3

## 2015-09-03 MED ORDER — ASPIRIN EC 81 MG PO TBEC
81.0000 mg | DELAYED_RELEASE_TABLET | Freq: Every day | ORAL | Status: DC
  Administered 2015-09-04: 09:00:00 81 mg via ORAL
  Filled 2015-09-03: qty 1

## 2015-09-03 MED ORDER — MIDAZOLAM HCL 2 MG/2ML IJ SOLN
INTRAMUSCULAR | Status: DC | PRN
Start: 2015-09-03 — End: 2015-09-03
  Administered 2015-09-03: 2 mg via INTRAVENOUS

## 2015-09-03 MED ORDER — HEPARIN SODIUM (PORCINE) 1000 UNIT/ML IJ SOLN
INTRAMUSCULAR | Status: AC
  Filled 2015-09-03: qty 1

## 2015-09-03 MED ORDER — FENTANYL CITRATE (PF) 100 MCG/2ML IJ SOLN
INTRAMUSCULAR | Status: AC
  Filled 2015-09-03: qty 2

## 2015-09-03 MED ORDER — ASPIRIN 81 MG PO CHEW: 81.0000 mg | CHEWABLE_TABLET | ORAL | Status: DC

## 2015-09-03 MED ORDER — MORPHINE SULFATE (PF) 2 MG/ML IV SOLN: 2.0000 mg | INTRAVENOUS | Status: DC | PRN

## 2015-09-03 MED ORDER — SODIUM CHLORIDE 0.9 % IV SOLN
250.0000 mg | INTRAVENOUS | Status: DC | PRN
  Administered 2015-09-03: 1.75 mg/kg/h via INTRAVENOUS

## 2015-09-03 MED ORDER — HYDRALAZINE HCL 20 MG/ML IJ SOLN
10.0000 mg | Freq: Once | INTRAMUSCULAR | Status: AC
  Administered 2015-09-03: 10 mg via INTRAVENOUS
  Filled 2015-09-03: qty 1

## 2015-09-03 MED ORDER — LISINOPRIL 40 MG PO TABS
40.0000 mg | ORAL_TABLET | Freq: Every day | ORAL | Status: DC
  Administered 2015-09-04: 09:00:00 40 mg via ORAL
  Filled 2015-09-03 (×2): qty 1

## 2015-09-03 MED ORDER — BIVALIRUDIN BOLUS VIA INFUSION - CUPID
INTRAVENOUS | Status: DC | PRN
  Administered 2015-09-03: 70.05 mg via INTRAVENOUS

## 2015-09-03 MED ORDER — HEPARIN (PORCINE) IN NACL 2-0.9 UNIT/ML-% IJ SOLN
INTRAMUSCULAR | Status: AC
  Filled 2015-09-03: qty 1000

## 2015-09-03 MED ORDER — CLOPIDOGREL BISULFATE 300 MG PO TABS
ORAL_TABLET | ORAL | Status: AC
  Filled 2015-09-03: qty 1

## 2015-09-03 MED ORDER — NITROGLYCERIN 1 MG/10 ML FOR IR/CATH LAB
INTRA_ARTERIAL | Status: AC
  Filled 2015-09-03: qty 10

## 2015-09-03 MED ORDER — SODIUM CHLORIDE 0.9 % IV SOLN: INTRAVENOUS | Status: DC

## 2015-09-03 MED ORDER — ONDANSETRON HCL 4 MG/2ML IJ SOLN: 4.0000 mg | Freq: Four times a day (QID) | INTRAMUSCULAR | Status: DC | PRN

## 2015-09-03 MED ORDER — COLCHICINE 0.6 MG PO TABS
0.6000 mg | ORAL_TABLET | Freq: Every day | ORAL | Status: DC
  Administered 2015-09-03 – 2015-09-04 (×2): 0.6 mg via ORAL
  Filled 2015-09-03 (×2): qty 1

## 2015-09-03 MED ORDER — METOPROLOL TARTRATE 25 MG PO TABS: 25.0000 mg | ORAL_TABLET | Freq: Two times a day (BID) | ORAL | Status: DC

## 2015-09-03 MED ORDER — FUROSEMIDE 20 MG PO TABS
20.0000 mg | ORAL_TABLET | Freq: Every day | ORAL | Status: DC
  Administered 2015-09-03 – 2015-09-04 (×2): 20 mg via ORAL
  Filled 2015-09-03 (×2): qty 1

## 2015-09-03 MED ORDER — METOPROLOL TARTRATE 25 MG PO TABS
25.0000 mg | ORAL_TABLET | Freq: Two times a day (BID) | ORAL | Status: DC
  Administered 2015-09-03 – 2015-09-04 (×2): 25 mg via ORAL
  Filled 2015-09-03 (×3): qty 1

## 2015-09-03 MED ORDER — SODIUM CHLORIDE 0.9 % WEIGHT BASED INFUSION: 1.0000 mL/kg/h | INTRAVENOUS | Status: AC

## 2015-09-03 MED ORDER — MIDAZOLAM HCL 2 MG/2ML IJ SOLN
INTRAMUSCULAR | Status: AC
  Filled 2015-09-03: qty 2

## 2015-09-03 MED ORDER — ROSUVASTATIN CALCIUM 10 MG PO TABS
10.0000 mg | ORAL_TABLET | Freq: Every day | ORAL | Status: DC
  Administered 2015-09-03 – 2015-09-04 (×2): 10 mg via ORAL
  Filled 2015-09-03 (×2): qty 1

## 2015-09-03 MED ORDER — SODIUM CHLORIDE 0.9% FLUSH
3.0000 mL | Freq: Two times a day (BID) | INTRAVENOUS | Status: DC
  Administered 2015-09-03: 16:00:00 3 mL via INTRAVENOUS

## 2015-09-03 MED ORDER — HYDRALAZINE HCL 20 MG/ML IJ SOLN
INTRAMUSCULAR | Status: DC | PRN
  Administered 2015-09-03: 10 mg via INTRAVENOUS

## 2015-09-03 MED ORDER — CLONIDINE HCL 0.1 MG PO TABS
0.2000 mg | ORAL_TABLET | Freq: Two times a day (BID) | ORAL | Status: DC
  Administered 2015-09-03 – 2015-09-04 (×3): 0.2 mg via ORAL
  Filled 2015-09-03 (×3): qty 2

## 2015-09-03 MED ORDER — DEXTROSE 5 % IV SOLN: 1.5000 g | INTRAVENOUS | Status: DC

## 2015-09-03 MED ORDER — SODIUM CHLORIDE 0.9% FLUSH: 3.0000 mL | INTRAVENOUS | Status: DC | PRN

## 2015-09-03 MED ORDER — LIDOCAINE HCL (PF) 1 % IJ SOLN
INTRAMUSCULAR | Status: DC | PRN
  Administered 2015-09-03: 3 mL

## 2015-09-03 MED ORDER — PANTOPRAZOLE SODIUM 20 MG PO TBEC
20.0000 mg | DELAYED_RELEASE_TABLET | Freq: Every day | ORAL | Status: DC
  Administered 2015-09-03 – 2015-09-04 (×2): 20 mg via ORAL
  Filled 2015-09-03 (×2): qty 1

## 2015-09-03 MED ORDER — CLOPIDOGREL BISULFATE 300 MG PO TABS
ORAL_TABLET | ORAL | Status: DC | PRN
  Administered 2015-09-03: 300 mg via ORAL

## 2015-09-03 MED ORDER — HEPARIN SODIUM (PORCINE) 1000 UNIT/ML IJ SOLN
INTRAMUSCULAR | Status: DC | PRN
  Administered 2015-09-03: 5000 [IU] via INTRAVENOUS

## 2015-09-03 MED ORDER — BIVALIRUDIN 250 MG IV SOLR
INTRAVENOUS | Status: AC
  Filled 2015-09-03: qty 250

## 2015-09-03 MED ORDER — ANGIOPLASTY BOOK
Freq: Once | Status: AC
  Administered 2015-09-03
  Filled 2015-09-03: qty 1

## 2015-09-03 MED ORDER — ACETAMINOPHEN 325 MG PO TABS: 650.0000 mg | ORAL_TABLET | Freq: Four times a day (QID) | ORAL | Status: DC | PRN

## 2015-09-03 SURGICAL SUPPLY — 17 items
BALLN ANGIOSCULPT RX 2.5X10 (BALLOONS) ×2
BALLN EUPHORA RX 2.0X10 (BALLOONS) ×2
BALLN ~~LOC~~ EUPHORA RX 2.5X8 (BALLOONS) ×2
BALLOON ANGIOSCULPT RX 2.5X10 (BALLOONS) ×1 IMPLANT
BALLOON EUPHORA RX 2.0X10 (BALLOONS) ×1 IMPLANT
BALLOON ~~LOC~~ EUPHORA RX 2.5X8 (BALLOONS) ×1 IMPLANT
CATH IMPULSE 5F ANG/FL3.5 (CATHETERS) ×2 IMPLANT
CATH VISTA GUIDE 6FR XBLAD3.5 (CATHETERS) ×2 IMPLANT
DEVICE RAD COMP TR BAND LRG (VASCULAR PRODUCTS) ×2 IMPLANT
GLIDESHEATH SLEND A-KIT 6F 22G (SHEATH) ×2 IMPLANT
KIT ENCORE 26 ADVANTAGE (KITS) ×2 IMPLANT
KIT HEART LEFT (KITS) ×2 IMPLANT
PACK CARDIAC CATHETERIZATION (CUSTOM PROCEDURE TRAY) ×2 IMPLANT
TRANSDUCER W/STOPCOCK (MISCELLANEOUS) ×2 IMPLANT
TUBING CIL FLEX 10 FLL-RA (TUBING) ×2 IMPLANT
WIRE HI TORQ BMW 190CM (WIRE) ×2 IMPLANT
WIRE SAFE-T 1.5MM-J .035X260CM (WIRE) ×2 IMPLANT

## 2015-09-03 NOTE — Progress Notes (Signed)
Rt tr band d/c'd per protocol. Site is level 0. Good cms and positive palpable rt radial pulse and ulnar. Pt is aware of mobility restrictions.

## 2015-09-03 NOTE — Care Management Note (Signed)
Case Management Note  Patient Details  Name: Edwin [name] MRN: [id number] Date of Birth: [birth year]  Subjective/Objective:  Patient is from [facility], CSW aware.  Patient is on Plavix which he was previously on before.  NCM will cont to follow for dc needs.                 Action/Plan:   Expected Discharge Date:                  Expected Discharge Plan:  Skilled Nursing Facility  In-House Referral:  Clinical Social Work  Discharge planning Services  CM Consult  Post Acute Care Choice:    Choice offered to:     DME Arranged:    DME Agency:     HH Arranged:    [place] Agency:     Status of Service:  In process, will continue to follow  Medicare Important Message Given:    Date Medicare IM Given:    Medicare IM give by:    Date Additional Medicare IM Given:    Additional Medicare Important Message give by:     If discussed at [place] of Stay Meetings, dates discussed:    Additional Comments:  Zenon Mayo, RN 09/03/2015, 12:29 PM

## 2015-09-03 NOTE — Interval H&P Note (Signed)
History and Physical Interval Note:  09/03/2015 10:22 AM  Harvie Junior  has presented today for surgery, with the diagnosis of cardiac clearance - Abnormal Nuclear Stress Test/  The various methods of treatment have been discussed with the patient and family. After consideration of risks, benefits and other options for treatment, the patient has consented to  Procedure(s): Left Heart Cath and Coronary Angiography (N/A) as a surgical intervention .  The patient's history has been reviewed, patient examined, no change in status, stable for surgery.  I have reviewed the patient's chart and labs.  Questions were answered to the patient's satisfaction.    Cath Lab Visit (complete for each Cath Lab visit)  Clinical Evaluation Leading to the Procedure:   ACS: No.  Non-ACS:    Anginal Classification: No Symptoms  Anti-ischemic medical therapy: Minimal Therapy (1 class of medications)  Non-Invasive Test Results: Intermediate-risk stress test findings: cardiac mortality 1-3%/year  Prior CABG: No previous CABG    HARDING, DAVID W
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
## 2015-09-04 ENCOUNTER — Other Ambulatory Visit: Payer: Self-pay

## 2015-09-04 ENCOUNTER — Inpatient Hospital Stay (HOSPITAL_COMMUNITY): Admission: RE | Admit: 2015-09-04 | Payer: Medicaid Other | Source: Ambulatory Visit | Admitting: Vascular Surgery

## 2015-09-04 ENCOUNTER — Inpatient Hospital Stay
Admission: RE | Admit: 2015-09-04 | Discharge: 2016-01-30 | Disposition: A | Discharge status: Other Acute Inpatient Hospital | Payer: Medicaid Other | Source: Ambulatory Visit | Attending: Internal Medicine | Admitting: Internal Medicine

## 2015-09-04 ENCOUNTER — Encounter (HOSPITAL_COMMUNITY): Admission: RE | Payer: Self-pay | Source: Ambulatory Visit

## 2015-09-04 DIAGNOSIS — I130 Hypertensive heart and chronic kidney disease with heart failure and stage 1 through stage 4 chronic kidney disease, or unspecified chronic kidney disease: Secondary | ICD-10-CM | POA: Diagnosis not present

## 2015-09-04 DIAGNOSIS — I2510 Atherosclerotic heart disease of native coronary artery without angina pectoris: Secondary | ICD-10-CM | POA: Diagnosis not present

## 2015-09-04 DIAGNOSIS — I2582 Chronic total occlusion of coronary artery: Secondary | ICD-10-CM | POA: Diagnosis not present

## 2015-09-04 DIAGNOSIS — R931 Abnormal findings on diagnostic imaging of heart and coronary circulation: Secondary | ICD-10-CM | POA: Diagnosis not present

## 2015-09-04 DIAGNOSIS — J449 Chronic obstructive pulmonary disease, unspecified: Secondary | ICD-10-CM | POA: Diagnosis not present

## 2015-09-04 LAB — BASIC METABOLIC PANEL
ANION GAP: 14 (ref 5–15)
BUN: 21 mg/dL — AB (ref 6–20)
CO2: 27 mmol/L (ref 22–32)
Calcium: 9.1 mg/dL (ref 8.9–10.3)
Chloride: 97 mmol/L — ABNORMAL LOW (ref 101–111)
Creatinine, Ser: 1.5 mg/dL — ABNORMAL HIGH (ref 0.61–1.24)
GFR calc Af Amer: 58 mL/min — ABNORMAL LOW (ref >60)
GFR calc non Af Amer: 50 mL/min — ABNORMAL LOW (ref >60)
GLUCOSE: 105 mg/dL — AB (ref 65–99)
POTASSIUM: 3.8 mmol/L (ref 3.5–5.1)
Sodium: 138 mmol/L (ref 135–145)

## 2015-09-04 LAB — CBC
HEMATOCRIT: 45.7 % (ref 39.0–52.0)
HEMOGLOBIN: 15.1 g/dL (ref 13.0–17.0)
MCH: 30.4 pg (ref 26.0–34.0)
MCHC: 33 g/dL (ref 30.0–36.0)
MCV: 92 fL (ref 78.0–100.0)
Platelets: 171 10*3/uL (ref 150–400)
RBC: 4.97 MIL/uL (ref 4.22–5.81)
RDW: 15.1 % (ref 11.5–15.5)
WBC: 8.5 10*3/uL (ref 4.0–10.5)

## 2015-09-04 SURGERY — ENDARTERECTOMY, FEMORAL
Anesthesia: General | Laterality: Left

## 2015-09-04 MED ORDER — METOPROLOL TARTRATE 25 MG PO TABS: 25.0000 mg | ORAL_TABLET | Freq: Two times a day (BID) | ORAL | Status: DC

## 2015-09-04 MED ORDER — CLOPIDOGREL BISULFATE 75 MG PO TABS: 75.0000 mg | ORAL_TABLET | Freq: Every day | ORAL | Status: DC

## 2015-09-04 MED ORDER — ISOSORBIDE MONONITRATE ER 30 MG PO TB24: 30.0000 mg | ORAL_TABLET | Freq: Every day | ORAL | Status: AC

## 2015-09-04 NOTE — Progress Notes (Addendum)
D/c instructions reviewed with pt. Copy of instructions and script for plavix given. Transportation at [facility] called at pt request (he uses their service to get back and forth), they will come and pick him up. Service to call when they are at the main pt entrance and unit staff will take him down, pt in his own wheelchair and has belongings packed and ready to go.  Pt lives at [facility], SW contacted them and pt is okay to return without further forms.

## 2015-09-04 NOTE — Care Management Note (Signed)
Case Management Note  Patient Details  Name: Edwin [name] MRN: [id number] Date of Birth: 1956/03/03  Subjective/Objective:   Patient is from [facility] and will return today , via [facility] transport, [place] aware.                 Action/Plan:   Expected Discharge Date:                  Expected Discharge Plan:  Skilled Nursing Facility  In-House Referral:  Clinical Social Work  Discharge planning Services  CM Consult  Post Acute Care Choice:    Choice offered to:     DME Arranged:    DME Agency:     HH Arranged:    [place] Agency:     Status of Service:  Completed, signed off  Medicare Important Message Given:    Date Medicare IM Given:    Medicare IM give by:    Date Additional Medicare IM Given:    Additional Medicare Important Message give by:     If discussed at [place] of Stay Meetings, dates discussed:    Additional Comments:  Zenon Mayo, RN 09/04/2015, 12:02 PM

## 2015-09-04 NOTE — Clinical Social Work Note (Signed)
Patient from [facility] and discharging back to facility today. SW Lieutenant Diego at [facility] contacted and made aware of patient's discharge. FL-2 not needed due to patient's very brief stay and they can access patient's FL-2. Patient has already arranged his transportation back to facility.  Brizeida Mcmurry Givens, MSW, LCSW Licensed Clinical Social Worker [place] [phone]

## 2015-09-04 NOTE — Progress Notes (Signed)
Patient Profile: 60 y.o. male with a history of COPD, AAA (3.3cm) severe PAD s/p multiple procedures and ultimately right AKA and now severe LLE PAD, carotid artery disease, severe L renal artery stenosis, CAD s/p DES to p LCx (2008), HTN, HLD, and prolonged tobacco abuse who was admitted for [facility] in the setting of an abnormal NST that was ordered for pre-operative clearance for planned femoral endarterectomy.   Subjective: No complaints. He denies CP and dyspnea. Unable to ambulate as he is s/p right AKA. He has a prosthesis but left it at home. Here with a wheelchair. Denies any right arm/ hand pain.   Objective: Vital signs in last 24 hours: Temp:  [98 F (36.7 C)-99 F (37.2 C)] 98 F (36.7 C) (02/07 0743) Pulse Rate:  [0-145] 60 (02/07 0743) Resp:  [0-21] 15 (02/07 0743) BP: (139-200)/(69-98) 157/74 mmHg (02/07 0743) SpO2:  [0 %-99 %] 94 % (02/07 0743) Weight:  [207 lb 7.3 oz (94.1 kg)] 207 lb 7.3 oz (94.1 kg) (02/07 0448)    Intake/Output from previous day: 02/06 0701 - 02/07 0700 In: 636.7 [P.O.:360; I.V.:276.7] Out: 3750 [Urine:3750] Intake/Output this shift:    Medications Current Facility-Administered Medications  Medication Dose Route Frequency Provider Last Rate Last Dose  . 0.9 %  sodium chloride infusion  250 mL Intravenous PRN Leonie Man, MD      . acetaminophen (TYLENOL) tablet 650 mg  650 mg Oral Q6H PRN Leonie Man, MD      . aspirin EC tablet 81 mg  81 mg Oral Daily Leonie Man, MD      . cloNIDine (CATAPRES) tablet 0.2 mg  0.2 mg Oral BID Leonie Man, MD   0.2 mg at 09/03/15 2142  . clopidogrel (PLAVIX) tablet 75 mg  75 mg Oral Daily Leonie Man, MD      . colchicine tablet 0.6 mg  0.6 mg Oral Daily Leonie Man, MD   0.6 mg at 09/03/15 1307  . furosemide (LASIX) tablet 20 mg  20 mg Oral Daily Leonie Man, MD   20 mg at 09/03/15 1307  . isosorbide mononitrate (IMDUR) 24 hr tablet 30 mg  30 mg Oral Daily Leonie Man, MD   30  mg at 09/03/15 2010  . lisinopril (PRINIVIL,ZESTRIL) tablet 40 mg  40 mg Oral Daily Leonie Man, MD      . metoprolol tartrate (LOPRESSOR) tablet 25 mg  25 mg Oral BID Theone Murdoch Hammons, RPH   25 mg at 09/03/15 2142  . mometasone-formoterol (DULERA) 100-5 MCG/ACT inhaler 2 puff  2 puff Inhalation BID Leonie Man, MD   2 puff at 09/03/15 2100  . morphine 2 MG/ML injection 2 mg  2 mg Intravenous Q1H PRN Leonie Man, MD      . ondansetron [facility]) injection 4 mg  4 mg Intravenous Q6H PRN Leonie Man, MD      . oxyCODONE (Oxy IR/ROXICODONE) immediate release tablet 15 mg  15 mg Oral Q4H PRN Brett Canales, PA-C   15 mg at 09/04/15 0459  . pantoprazole (PROTONIX) EC tablet 20 mg  20 mg Oral Daily Leonie Man, MD   20 mg at 09/03/15 1307  . potassium chloride SA (K-DUR,KLOR-CON) CR tablet 20 mEq  20 mEq Oral Daily Leonie Man, MD      . rosuvastatin (CRESTOR) tablet 10 mg  10 mg Oral Daily Leonie Man, MD   10 mg at  09/03/15 1307  . sodium chloride flush (NS) 0.9 % injection 3 mL  3 mL Intravenous Q12H Leonie Man, MD   3 mL at 09/03/15 1600  . sodium chloride flush (NS) 0.9 % injection 3 mL  3 mL Intravenous PRN Leonie Man, MD      . temazepam (RESTORIL) capsule 30 mg  30 mg Oral QHS PRN Leonie Man, MD   30 mg at 09/04/15 0024    PE: General appearance: alert, cooperative and no distress Neck: no carotid bruit and no JVD Lungs: clear to auscultation bilaterally Heart: regular rate and rhythm, S1, S2 normal, no murmur, click, rub or gallop Extremities: s/p right AKA, left LEE with ulcers (covered in dressing) Pulses: s/p right AKA. decreased distal pulse on the left Skin: warm and dry Neurologic: Grossly normal  Lab Results:   Recent Labs  09/04/15 0615  WBC 8.5  HGB 15.1  HCT 45.7  PLT 171   BMET  Recent Labs  09/04/15 0615  NA 138  K 3.8  CL 97*  CO2 27  GLUCOSE 105*  BUN 21*  CREATININE 1.50*  CALCIUM 9.1     Studies/Results: Procedures    Coronary Balloon Angioplasty   Left Heart Cath and Coronary Angiography    Conclusion    1. Mid Cx lesion, 95% stenosed. Post POBA / PTCA intervention, there is a 30% residual stenosis. The lesion was previously treated with a stent (unknown type). 2. Mid RCA to Dist RCA lesion, 100% stenosed. CTO 3. Mildly reduced LV function   Patient has severe 2 vessel disease with apparent CTO of the mid RCA and 99% stenosis of the ostial AV groove circumflex coming off the stent the goes into OM1.  There was no landing zone to place a stent, therefore the decision was made to perform angioplasty only using a scoring balloon. This only reduced the lesion to 50%, therefore noncompliant balloon was used reducing the lesion to 30%.       Assessment/Plan  Principal Problem:   Abnormal nuclear stress test Active Problems:   Hyperlipidemia with target LDL less than 70   PAD (peripheral artery disease) (HCC)   Essential hypertension   CAD S/P percutaneous coronary angioplasty   Abnormal stress test   1. CAD:  LHC 09/03/15 showed severe 2 vessel disease with apparent CTO of the mid RCA and 99% stenosis of the ostial AV groove circumflex coming off the stent the goes into OM1. There was no landing zone to place a stent, therefore the decision was made to perform angioplasty only using a scoring balloon. This only reduced the lesion to 50%, therefore noncompliant balloon was used reducing the lesion to 30%. He is CP free. He is unable to ambulate due to prior leg amputation. Plan is to restart Plavix x minimum of 1 month (30 days), after which, therapy can be suspended for planned Vascular Surgery. Will need to postpone surgery for now. Imdur also added for antianginal relief & anti-spasm. Per Dr. Ellyn Hack, consider CTO PCI of mRCA following surgery.  2. Post Cath: Renal function is stable with SCr at 1.50, which is his baseline. Right radial cath site is stable.    3. PVD: given Plavix has been restarted after recent PCI, will need to post pone vascular surgery. Per Dr. Ellyn Hack, will need to continue Plavix uninterrupted x 1 month, after which therapy can be suspended for vascular surgery.   4. HTN: BP moderately elevated. Continue clonidine, Imdur, and metoprolol. His  lisinopril was initially held for cath but has been ordered to restart this morning. 40 mg ordered.    Brittainy M. Ladoris Gene 09/04/2015 7:54 AM  Patient seen, examined. Available data reviewed. Agree with findings, assessment, and plan as outlined by Lyda Jester, PA-C. Right radial site is clear on exam. Heart is rrr without murmur. Plans reinforced with patient - i.e. ASA and plavix x one month then proceed with femoral endarterectomy as planned. Consider CTO-PCI of the RCA after vascular surgery per note of Dr Ellyn Hack. Pt should be put in for consult with Dr [place] or Dr Irish Lack in approximately 8 weeks. thx  Sherren Mocha, M.D. 09/04/2015 9:46 AM

## 2015-09-04 NOTE — Discharge Summary (Signed)
Discharge Summary    Patient ID: Edwin [name],  MRN: [id number], DOB/AGE: 59-Apr-1957 59 y.o.  Admit date: 09/03/2015 Discharge date: 09/04/2015  Primary Care Provider: Webb Silversmith Primary Cardiologist: Dr. Fletcher Anon  Discharge Diagnoses    Principal Problem:   Abnormal nuclear stress test Active Problems:   Hyperlipidemia with target LDL less than 70   PAD (peripheral artery disease) ([place])   Essential hypertension   CAD S/P percutaneous coronary angioplasty   Abnormal stress test   Allergies Allergies  Allergen Reactions  . Codeine Nausea Only    Diagnostic Studies/Procedures    Procedures    Coronary Balloon Angioplasty   Left Heart Cath and Coronary Angiography    Conclusion    1. Mid Cx lesion, 95% stenosed. Post POBA / PTCA intervention, there is a 30% residual stenosis. The lesion was previously treated with a stent (unknown type). 2. Mid RCA to Dist RCA lesion, 100% stenosed. CTO 3. Mildly reduced LV function   Patient has severe 2 vessel disease with apparent CTO of the mid RCA and 99% stenosis of the ostial AV groove circumflex coming off the stent the goes into OM1.  There was no landing zone to place a stent, therefore the decision was made to perform angioplasty only using a scoring balloon. This only reduced the lesion to 50%, therefore noncompliant balloon    Left Heart    Left Ventricle The left ventricular size is normal. The left ventricular ejection fraction is 45-50% by visual estimate. There are wall motion abnormalities in the left ventricle. There is global hypokinesis of the left ventricle.   Mitral Valve There is trivial (1+) mitral regurgitation.   Aortic Valve There is no aortic valve stenosis, and no aortic valve regurgitation. The aortic valve is calcified.       History of Present Illness     60 y.o. male with a history of COPD, AAA (3.3cm) severe PAD s/p multiple procedures and ultimately right AKA and now severe LLE  PAD, carotid artery disease, severe L renal artery stenosis, CAD s/p DES to p LCx (2008), HTN, HLD, and prolonged tobacco abuse who was admitted for [facility] in the setting of an abnormal NST that was ordered for pre-operative clearance for planned femoral endarterectomy.  Stress test done on 08/27/15 showed:  4. Nuclear stress EF: 48%. 5. There was no ST segment deviation noted during stress. 6. Defect 1: There is a medium defect of moderate severity present in the basal inferior, basal inferolateral, mid inferior and mid inferolateral location. 7. The left ventricular ejection fraction is mildly decreased (45-54%). 8. Findings consistent with prior myocardial infarction with peri-infarct ischemia. 9. This is an intermediate risk study.  Hospital Course     He presented to [facility] on 09/03/15 for the planned procedure which was performed by Dr. Ellyn Hack.Access was obtained via the right radial artery. Cath showed showed severe 2 vessel disease with apparent CTO of the mid RCA and 99% stenosis of the ostial AV groove circumflex coming off the stent then goes into OM1. There was no landing zone to place a stent, therefore the decision was made to perform angioplasty only using a scoring balloon. This only reduced the lesion to 50%, therefore noncompliant balloon was used reducing the lesion to 30%. The CTO of the mRCA was not intervened on. Dr. Ellyn Hack recommended considering CTO PCI following his vascular surgery. His EF was mildly reduced at 45-50%. The patient was restarted on Plavix, for which he will need  to continue for a minimum of 1 month, after which, therapy can be suspended for planned Vascular Surgery. Will need to postpne surgery for now.   He was monitored overnight and had no post cath complications. He denied and chest pain or dyspnea (nonabulatory given his prior right leg amputation). He had no complications with his cath site. His vital signs remained stable. He was last seen and examined by Dr.  Burt Knack who determined he was stable for discharge home. He will f/u either with Dr. Fletcher Anon or APP in several weeks. Dr. Burt Knack has also recommended that he be scheduled for a CTO consult with either Dr. [place] or Dr. Irish Lack in 8 weeks.    Consultants: none  _____________  Discharge Vitals Blood pressure 157/74, pulse 60, temperature 98 F (36.7 C), temperature source Oral, resp. rate 15, height 5\' 8"  (1.727 m), weight 207 lb 7.3 oz (94.1 kg), SpO2 94 %.  Filed Weights   09/03/15 0753 09/04/15 0448  Weight: 206 lb (93.441 kg) 207 lb 7.3 oz (94.1 kg)    Labs & Radiologic Studies     CBC  Recent Labs  09/04/15 0615  WBC 8.5  HGB 15.1  HCT 45.7  MCV 92.0  PLT [id number]   Basic Metabolic Panel  Recent Labs  09/04/15 0615  NA 138  K 3.8  CL 97*  CO2 27  GLUCOSE 105*  BUN 21*  CREATININE 1.50*  CALCIUM 9.1   Liver Function Tests No results for input(s): AST, ALT, ALKPHOS, BILITOT, PROT, ALBUMIN in the last 72 hours. No results for input(s): LIPASE, AMYLASE in the last 72 hours. Cardiac Enzymes No results for input(s): CKTOTAL, CKMB, CKMBINDEX, TROPONINI in the last 72 hours. BNP Invalid input(s): POCBNP D-Dimer No results for input(s): DDIMER in the last 72 hours. Hemoglobin A1C No results for input(s): HGBA1C in the last 72 hours. Fasting Lipid Panel No results for input(s): CHOL, HDL, LDLCALC, TRIG, CHOLHDL, LDLDIRECT in the last 72 hours. Thyroid Function Tests No results for input(s): TSH, T4TOTAL, T3FREE, THYROIDAB in the last 72 hours.  Invalid input(s): FREET3  No results found.  Disposition   Pt is being discharged home today in good condition.  Follow-up Plans & Appointments    Follow-up Information    Follow up with [facility].   Specialty:  Cardiology   Why:  our office will call you with a follow-up appointment    Contact information:   [address] [place] [address] [phone]      Discharge Instructions    Diet - low sodium heart healthy    Complete by:  As directed      Increase activity slowly    Complete by:  As directed            Discharge Medications   Current Discharge Medication List    START taking these medications   Details  isosorbide mononitrate (IMDUR) 30 MG 24 hr tablet Take 1 tablet (30 mg total) by mouth daily. Qty: 30 tablet, Refills: 5      CONTINUE these medications which have CHANGED   Details  clopidogrel (PLAVIX) 75 MG tablet Take 1 tablet (75 mg total) by mouth daily. Qty: 30 tablet, Refills: 5    metoprolol tartrate (LOPRESSOR) 25 MG tablet Take 1 tablet (25 mg total) by mouth 2 (two) times daily. Qty: 60 tablet, Refills: 5      CONTINUE these medications which have NOT CHANGED   Details  aspirin EC 81 MG tablet Take 1 tablet (81 mg total) by mouth daily. Qty: 150 tablet, Refills: 2    colchicine 0.6 MG tablet Take 0.6 mg by mouth daily.     Fluticasone-Salmeterol (ADVAIR) 250-50 MCG/DOSE AEPB Inhale 1 puff into the lungs 2 (two) times daily.    furosemide (LASIX) 20 MG tablet Take 20 mg by mouth daily.     lisinopril (PRINIVIL,ZESTRIL) 40 MG tablet Take 40 mg by mouth daily.    oxyCODONE (ROXICODONE) 15 MG immediate release tablet Take one tablet by mouth every 4 hours as needed for pain Qty: 180 tablet, Refills: 0    pantoprazole (PROTONIX) 20 MG tablet Take 20 mg by mouth daily.    potassium chloride SA (K-DUR,KLOR-CON) 20 MEQ tablet Take 20 mEq by mouth daily. Potassium 30 mEq daily    rosuvastatin (CRESTOR) 10 MG tablet Take 10 mg by mouth daily.    temazepam (RESTORIL) 30 MG capsule Take one capsule by mouth at bedtime as needed for insomnia Qty: 30 capsule, Refills: 5    acetaminophen (TYLENOL) 325 MG tablet Take 2 tablets (650 mg total) by mouth every 6 (six) hours as needed for mild pain, fever or headache.    cloNIDine (CATAPRES) 0.2 MG tablet Take 0.2 mg by mouth 2 (two) times daily.          Aspirin prescribed at discharge?  Yes High Intensity Statin Prescribed? (Lipitor 40-80mg  or Crestor 20-40mg ): No, Crestor 10 mg Rx Beta Blocker Prescribed? Yes For EF 45% or less, Was ACEI/ARB Prescribed? Yes ADP Receptor Inhibitor Prescribed? (i.e. Plavix etc.-Includes Medically Managed Patients): Yes For EF <40%, Aldosterone Inhibitor Prescribed? No: EF >40 % EF 45-50% Was EF assessed during THIS hospitalization? Yes Was Cardiac Rehab II ordered? (Included Medically managed Patients): No: nonambulatory patient    Outstanding Labs/Studies   none  Duration of Discharge Encounter   Greater than 30 minutes including physician time.  Alveta Heimlich, [place] Dani NP 09/04/2015, 10:32 AM

## 2015-09-04 NOTE — Progress Notes (Signed)
CARDIAC REHAB PHASE I   Pt in bed, states he is able to perform transfers with his prosthesis (which he left at home) and has not walked since 2015. Completed PCI education with pt.  Reviewed risk factors, tobacco cessation (pt states he does not plan to quit smoking at this time), anti-platelet therapy, activity restrictions, ntg, heart healthy diet, portion control and phase 2 cardiac rehab. Pt verbalized understanding, limited receptiveness to education. Pt not appropriate for cardiac rehab exercise guidelines as pt is non-ambulatory. Pt declines phase 2 cardiac rehab referral, states he does not walk and is not interested. Left pt brochure in the event he changes his mind. Pt in bed, call bell within reach.    [id number] Lenna Sciara, RN, BSN 09/04/2015 9:33 AM

## 2015-09-05 ENCOUNTER — Telehealth: Payer: Self-pay

## 2015-09-05 ENCOUNTER — Other Ambulatory Visit: Payer: Self-pay | Admitting: *Deleted

## 2015-09-05 MED ORDER — TEMAZEPAM 30 MG PO CAPS: ORAL_CAPSULE | ORAL | Status: DC

## 2015-09-05 NOTE — Telephone Encounter (Signed)
Attempted to reach patient on primary contact phone. This phone number is contact phone for SNF in which patient resides. Spoke with patient's nurse, Inez Catalina, who stated patient is doing well. Patient will be have f/u scheduled by cardiology office per discharge summary.

## 2015-09-05 NOTE — Telephone Encounter (Signed)
[facility] 

## 2015-09-07 ENCOUNTER — Other Ambulatory Visit: Payer: Self-pay

## 2015-09-07 MED ORDER — OXYCODONE HCL 15 MG PO TABS: ORAL_TABLET | ORAL | Status: DC

## 2015-09-09 ENCOUNTER — Non-Acute Institutional Stay (SKILLED_NURSING_FACILITY): Payer: Medicaid Other | Admitting: Internal Medicine

## 2015-09-09 DIAGNOSIS — I255 Ischemic cardiomyopathy: Secondary | ICD-10-CM

## 2015-09-09 DIAGNOSIS — I739 Peripheral vascular disease, unspecified: Secondary | ICD-10-CM | POA: Diagnosis not present

## 2015-09-09 DIAGNOSIS — N183 Chronic kidney disease, stage 3 unspecified: Secondary | ICD-10-CM

## 2015-09-12 NOTE — Progress Notes (Signed)
Patient ID: Edwin [name], male   DOB: 21-Aug-1955, 60 y.o.   MRN: [id number]                PROGRESS NOTE  DATE:  09/09/2015          FACILITY: [facility]                     LEVEL OF CARE:   SNF   Acute Visit           CHIEF COMPLAINT:  Review of recent issues  HISTORY OF PRESENT ILLNESS:  Edwin [name] is a gentleman whom I am following for a left lower extremity wound.  He has already had a right above-knee amputation.     He has a history of PAD as well as venous insufficiency, and follows with Vascular Surgery.    He had a stent placed in his left external iliac artery in 2017, and also a 50% left common femoral artery stenosis.    The patient was in hospital from 09/03/2015 through 09/04/2015.  He was seen by Cardiology preoperatively in preparation for a planned femoral endarterectomy.  He underwent a non-stress test which showed an EF of 48%.  There were perfusion deficits in the basilar, inferior basal, inferolateral, mild inferior, and mid inferolateral locations.  He was therefore brought in this time for a cardiac catheterization.  There was no landing zone to place his stent.  Therefore, he had an angioplasty.  He was put on Plavix for a month and he needed to suspend his planned vascular surgery.    REVIEW OF SYSTEMS:    CHEST/RESPIRATORY:  The patient is not complaining of shortness of breath.  Still smoking about half a pack a day.      CARDIAC:  He never complains of chest pain.   SKIN:  Extremities:  States his left leg wound is still open.  I will have a look at that later on this week.    PHYSICAL EXAMINATION:   CHEST/RESPIRATORY:  Shallow air entry. Mild expiratory wheezing.  I suspect he has COPD.   CARDIOVASCULAR:   CARDIAC:  Heart sounds are normal.  There are no murmurs.   Apparently, the cardiac cath was done through his wrist.    ASSESSMENT/PLAN:                Peripheral vascular disease.  He is now on Plavix.  His vascular surgery  procedures, therefore, have been postponed.     Coronary artery disease with a presumed ischemic cardiomyopathy.  He underwent an angioplasty.  He is already on an ACE inhibitor and beta blocker.  The Lopressor has been increased from 12.5 to 25 b.i.d., and Imdur was added at 30 mg.    Chronic renal failure.  He has a creatinine of 1.5.  Stage III chronic renal failure with an estimated GFR of 50.     CPT CODE: 09811

## 2015-09-21 ENCOUNTER — Non-Acute Institutional Stay (SKILLED_NURSING_FACILITY): Payer: Medicaid Other | Admitting: Internal Medicine

## 2015-09-21 DIAGNOSIS — J42 Unspecified chronic bronchitis: Secondary | ICD-10-CM | POA: Diagnosis not present

## 2015-09-21 DIAGNOSIS — E785 Hyperlipidemia, unspecified: Secondary | ICD-10-CM

## 2015-09-21 DIAGNOSIS — I10 Essential (primary) hypertension: Secondary | ICD-10-CM | POA: Diagnosis not present

## 2015-09-21 DIAGNOSIS — I739 Peripheral vascular disease, unspecified: Secondary | ICD-10-CM | POA: Diagnosis not present

## 2015-09-21 DIAGNOSIS — G8929 Other chronic pain: Secondary | ICD-10-CM | POA: Diagnosis not present

## 2015-09-21 NOTE — Progress Notes (Signed)
Patient ID: Edwin [name], male   DOB: 03/18/1956, 60 y.o.   MRN: [id number]                 PROGRESS NOTE  DATE:  09/21/2015          FACILITY: [facility]                     LEVEL OF CARE:   SNF   Acute Visit           CHIEF COMPLAINT:  Acute visit follow-up hyperlipidemia-renal insufficiency-COPD  HISTORY OF PRESENT ILLNESS:  Edwin [name] is a gentleman with the above diagnosis.  He was recently seen by Dr. Dellia Nims  for a left lower extremity wound.  He has already had a right above-knee amputation.     He has a history of PAD as well as venous insufficiency, and follows with Vascular Surgery.    He had a stent placed in his left external iliac artery in 2017, and also a 50% left common femoral artery stenosis.    The patient was in hospital from 09/03/2015 through 09/04/2015.  He was seen by Cardiology preoperatively in preparation for a planned femoral endarterectomy.  He underwent a non-stress test which showed an EF of 48%.  There were perfusion deficits in the basilar, inferior basal, inferolateral, mild inferior, and mid inferolateral locations.  He was therefore brought in this time for a cardiac catheterization.  There was no landing zone to place his stent.  Therefore, he had an angioplasty.  He was put on Plavix for a month and he needed to suspend his planned vascular surgery.  --Apparently this has been rescheduled.  Patient also was recently restarted on a statin with a history of his vascular disease this was recommended a cardiology-he has been on Crestor which she had been on previously and tolerated well however apparently there are insurance issues and pharmacy has recommended switching to simvastatin.  Patient apparently has had difficulties with another statin Lipitor at one time he says apparently made him feel weak and loopy  And he is open to trying simvastatin  He also has a history of renal insufficiency creatinine was 1.5 on lab done  February 7 which appears to be relatively baseline for him he does have a history of lower extremity edema is on Lasix 20 mg a day this was titrated down at one point secondary to his renal issues. Is also on potassium supplementation  He also has a history of what appears to be COPD he is on Advair Diskus as well as duo nebs every 6 hours when necessary as this appears relatively stable he continues to smoke a has been encouraged to quit but apparently this continues to be a challenge for him.  Currently he has no acute complaints continues to we will about in his wheelchair and goes outside frequently.  He does have a history of hypertension this appears under somewhat better control with systolics appears more in the 130s at times he does have some spikes but this does not appear to be persistent I see listed 158/89-again patient usually requires a larger cough for accurate reading [place] suspect at times smaller cuff is used.  He continues on clonidine 0.2 mg twice a day-lisinopril 40 mg a day as well as Lopressor 25 mg twice a day  REVIEW OF SYSTEMS: Gen. no complaints of fever or chills.  Head ears eyes nose mouth and throat does not complain of  any visual changes or sore throat    CHEST/RESPIRATORY:  The patient is not complaining of shortness of breath.  Still smoking about half a pack a day.      CARDIAC: no chest pain.   SKIN:  Left leg wound apparently stable and followed closely by wound care and Dr. Dellia Nims Muscle skeletal is not complaining of joint pain he does have somewhat chronic pain complaints is on oxycodone 15 mg every 4 hours when necessary apparently takes this quite frequently .  Neurologic is not complaining of dizziness or headache.  Psych continues appears to be in good spirits does complain at times of insomnia --he is on temazepam 30 mg daily at bedtime as needed    PHYSICAL EXAMINATION:   Temperature 97.8 pulse 58 respirations 20 blood pressure 158/89-as noted above  there is variability I suspect sometimes related to the size of the cuff used- General this a pleasant middle-aged male in no distress sitting comfortably in his chair.  His skin is warm and dry-again lower extremity wound is followed by wound care and Dr. Dellia Nims.  Eyes pupils appear reactive light acuity appears intact.  Oropharynx is clear mucous membranes moist she has numerous extractions t  CHEST/RESPIRATORY:  Shallow air entry. Mild expiratory wheezing.  I suspect he has COPD.   CARDIOVASCULAR:   CARDIAC:  Heart sounds are normal.  There are no murmurs.   Apparently, the cardiac cath was done through his wrist.  Abdomen is obese soft nontender positive bowel sounds.  Muscle skeletal has dressing over his lower left leg wound-does ambulate in a wheelchair strength is quite impressive of his upper extremities I suspect from ambulating in his wheelchair-he is status post  right BKA   Labs.  09/04/2015.  Sodium 138 potassium 3.8 BUN 21 creatinine 1.5.  WBC 8.5 hemoglobin 15.1 platelets 171.  08/29/2015.  Liver function tests within normal limits except ALT of 15.  08/13/2015.  Cholesterol 114-triglycerides 254-HDL 27-LDL 36    ASSESSMENT/PLAN:                Peripheral vascular disease.  He is now on Plavix.  His vascular surgery procedures, therefore, have been postponed.  but have been rescheduled     Coronary artery disease with a presumed ischemic cardiomyopathy.  He underwent an angioplasty.  He is already on an ACE inhibitor and beta blocker.  The Lopressor has been increased from 12.5 to 25 b.i.d., and Imdur was added at 30 mg.   he is now on a statin again this has been switched as noted above to Lipitor per pharmacy recommendation-monitor for any side effects since apparently he did not tolerate Lipitor-cholesterol panel last month showed LDL 36 liver function tests essentially unremarkable on lab done February 1   Chronic renal failure.  He has a creatinine of 1.5.   this appears to be relatively stable we will update this.    Hypertension-again has somewhat variable blood pressures as noted above I suspect some of this is due to the cuff used at this point monitor with blood pressure checks daily with a log for provider review    I do note per chart review he does have a history of a partial parathyroidectomy-margins apparently were clear when this was done-there has been follow up scheduled-- patient   has refused this  feels fine and does not feel the follow-up is necessary.   COPD appears relatively stable on Advair Diskus as well as duo nebs as needed every 6  hours.--Since there is flun the facility has been started on empiric Tamiflu  Chronic pain more so of his shoulders this appears controlled currently on the oxycodone 50 mg every 4 hours when necessary  CPT-99309         CPT CODE: 29562

## 2015-09-24 ENCOUNTER — Encounter (HOSPITAL_COMMUNITY)
Admission: RE | Admit: 2015-09-24 | Discharge: 2015-09-24 | Disposition: A | Discharge status: Home / Self Care | Payer: Medicaid Other | Source: Skilled Nursing Facility | Attending: Internal Medicine | Admitting: Internal Medicine

## 2015-09-24 LAB — BASIC METABOLIC PANEL
ANION GAP: 10 (ref 5–15)
BUN: 26 mg/dL — ABNORMAL HIGH (ref 6–20)
CALCIUM: 8.7 mg/dL — AB (ref 8.9–10.3)
CHLORIDE: 95 mmol/L — AB (ref 101–111)
CO2: 31 mmol/L (ref 22–32)
Creatinine, Ser: 1.64 mg/dL — ABNORMAL HIGH (ref 0.61–1.24)
GFR calc non Af Amer: 45 mL/min — ABNORMAL LOW (ref >60)
GFR, EST AFRICAN AMERICAN: 52 mL/min — AB (ref >60)
Glucose, Bld: 124 mg/dL — ABNORMAL HIGH (ref 65–99)
POTASSIUM: 4.2 mmol/L (ref 3.5–5.1)
Sodium: 136 mmol/L (ref 135–145)

## 2015-09-24 LAB — CBC AND DIFFERENTIAL: WBC: 7.7 10*3/mL

## 2015-09-24 LAB — CBC WITH DIFFERENTIAL/PLATELET
BASOS PCT: 1 %
Basophils Absolute: 0.1 10*3/uL (ref 0.0–0.1)
Eosinophils Absolute: 0.2 10*3/uL (ref 0.0–0.7)
Eosinophils Relative: 3 %
HEMATOCRIT: 45.6 % (ref 39.0–52.0)
HEMOGLOBIN: 14.9 g/dL (ref 13.0–17.0)
LYMPHS PCT: 27 %
Lymphs Abs: 2.1 10*3/uL (ref 0.7–4.0)
MCH: 30.5 pg (ref 26.0–34.0)
MCHC: 32.7 g/dL (ref 30.0–36.0)
MCV: 93.4 fL (ref 78.0–100.0)
MONOS PCT: 8 %
Monocytes Absolute: 0.6 10*3/uL (ref 0.1–1.0)
NEUTROS ABS: 4.8 10*3/uL (ref 1.7–7.7)
NEUTROS PCT: 61 %
Platelets: 177 10*3/uL (ref 150–400)
RBC: 4.88 MIL/uL (ref 4.22–5.81)
RDW: 14.7 % (ref 11.5–15.5)
WBC: 7.7 10*3/uL (ref 4.0–10.5)

## 2015-10-02 ENCOUNTER — Non-Acute Institutional Stay (SKILLED_NURSING_FACILITY): Payer: Medicaid Other | Admitting: Internal Medicine

## 2015-10-02 DIAGNOSIS — N289 Disorder of kidney and ureter, unspecified: Secondary | ICD-10-CM | POA: Diagnosis not present

## 2015-10-02 DIAGNOSIS — I10 Essential (primary) hypertension: Secondary | ICD-10-CM

## 2015-10-02 DIAGNOSIS — R609 Edema, unspecified: Secondary | ICD-10-CM | POA: Diagnosis not present

## 2015-10-02 NOTE — Progress Notes (Signed)
Patient ID: Edwin [name], male   DOB: October 16, 1955, 60 y.o.   MRN: [id number]                  PROGRESS NOTE  This is an acute visit     FACILITY: [facility]                     LEVEL OF CARE:   SNF   Acute Visit           CHIEF COMPLAINT:  Acute visit secondary to increased edema  HISTORY OF PRESENT ILLNESS:   .    Patient is a 60 year old male who does have a history of ischemic cardiomyopathy he is on low-dose Lasix at 20 mg a day-we have been somewhat conservative here with history of renal insufficiency.  Per nursing and patient has gained some edema of his left lower leg-he is a right lower extremity amputee.  Patient would like his Lasix increased-at times we have done this in the past and then back off secondary to increased creatinine does have baseline renal insufficiency with a baseline of around 1.5.  Currently denies any increase shortness of breath she does have history COPD denies any increased cough from baseline.  l weights appears to show some weight gain of about 6 pounds over the past month.         REVIEW OF SYSTEMS: Gen. no complaints of fever or chills.  Head ears eyes nose mouth and throat does not complain of any visual changes or sore throat    CHEST/RESPIRATORY:  The patient is not complaining of shortness of breath.  Still smoking about half a pack a day.      CARDIAC: no chest pain.    Muscle skeletal is not complaining of joint pain he does have somewhat chronic pain complaints is on oxycodone 15 mg every 4 hours when necessary apparently takes this quite frequently .  Neurologic is not complaining of dizziness or headache.  Psych continues appears to be in good spirits does complain at times of insomnia --he is on temazepam 30 mg daily at bedtime as needed    PHYSICAL EXAMINATION:   He is afebrile pulse 76 respirations 18 blood pressure 130/70 weight is 212.6  General this a pleasant middle-aged male in no distress  sitting comfortably in hiswheel chair.  His skin is warm and dry- lower extremity wound is followed by wound care and Dr. Dellia Nims.  Eyes pupils appear reactive light acuity appears intact.  Oropharynx is clear mucous membranes moist she has numerous extractions t  CHEST/RESPIRATORY:  Shallow air entry. Mild expiratory wheezing which actually appears somewhat improved.  I suspect he has COPD.   CARDIOVASCULAR:   CARDIAC:  Heart sounds are normal.  There are no murmurs.  Has some mildly increased lower extremity edema on the left-suspect there is also some venous stasis here  Abdomen is obese soft nontender positive bowel sounds.  Muscle skeletal h-does ambulate in a wheelchair strength is quite impressive of his upper extremities I suspect from ambulating in his wheelchair-he is status post  right BKA   Labs.  Bili 27 2017.  Sodium 136 potassium 4.7 BUN 26 creatinine 1.64.  WBC 7.7 hemoglobin 14.9 platelets 177 09/04/2015.  Sodium 138 potassium 3.8 BUN 21 creatinine 1.5.  WBC 8.5 hemoglobin 15.1 platelets 171.  08/29/2015.  Liver function tests within normal limits except ALT of 15.  08/13/2015.  Cholesterol 114-triglycerides 254-HDL 27-LDL 36  ASSESSMENT/PLAN:  History of mildly increased edema with weight gain-this is complicated with his history of renal insufficiency-will cautiously increase his Lasix to 40 mg on Monday Wednesday Friday continue 20 mg day all other days-continue potassium supplementation-will recheck a BMP tomorrow to ensure stability as well as next week.                         Coronary artery disease with a presumed ischemic cardiomyopathy.  He underwent an angioplasty.  He is already on an ACE inhibitor and beta blocker.  The Lopressor has been increased from 12.5 to 25 b.i.d., and Imdur was added at 30 mg.   he is now on a statin again this has been switched as noted above to Lipitor per pharmacy recommendation-  -cholesterol panel recently  showed LDL 36 liver function tests essentially unremarkable on lab done February 1   Chronic renal failure.  Recent creatinine 1.64 BUN 26 again we will update this.  Hypertension-this appears stable on current medications recent blood pressures 130/70-124/80-       [id number]

## 2015-10-03 ENCOUNTER — Encounter (HOSPITAL_COMMUNITY)
Admission: RE | Admit: 2015-10-03 | Discharge: 2015-10-03 | Disposition: A | Discharge status: Home / Self Care | Payer: Medicaid Other | Source: Skilled Nursing Facility | Attending: Internal Medicine | Admitting: Internal Medicine

## 2015-10-03 DIAGNOSIS — K259 Gastric ulcer, unspecified as acute or chronic, without hemorrhage or perforation: Secondary | ICD-10-CM | POA: Insufficient documentation

## 2015-10-03 DIAGNOSIS — Z85819 Personal history of malignant neoplasm of unspecified site of lip, oral cavity, and pharynx: Secondary | ICD-10-CM | POA: Insufficient documentation

## 2015-10-03 DIAGNOSIS — J449 Chronic obstructive pulmonary disease, unspecified: Secondary | ICD-10-CM | POA: Insufficient documentation

## 2015-10-03 DIAGNOSIS — Z89511 Acquired absence of right leg below knee: Secondary | ICD-10-CM | POA: Diagnosis present

## 2015-10-03 DIAGNOSIS — Z720 Tobacco use: Secondary | ICD-10-CM | POA: Insufficient documentation

## 2015-10-03 DIAGNOSIS — M10079 Idiopathic gout, unspecified ankle and foot: Secondary | ICD-10-CM | POA: Diagnosis not present

## 2015-10-03 DIAGNOSIS — D649 Anemia, unspecified: Secondary | ICD-10-CM | POA: Insufficient documentation

## 2015-10-03 LAB — BASIC METABOLIC PANEL
Anion gap: 7 (ref 5–15)
BUN: 27 mg/dL — ABNORMAL HIGH (ref 6–20)
CALCIUM: 9.2 mg/dL (ref 8.9–10.3)
CHLORIDE: 101 mmol/L (ref 101–111)
CO2: 33 mmol/L — ABNORMAL HIGH (ref 22–32)
CREATININE: 1.42 mg/dL — AB (ref 0.61–1.24)
GFR calc non Af Amer: 53 mL/min — ABNORMAL LOW (ref >60)
Glucose, Bld: 135 mg/dL — ABNORMAL HIGH (ref 65–99)
Potassium: 3.9 mmol/L (ref 3.5–5.1)
SODIUM: 141 mmol/L (ref 135–145)

## 2015-10-07 ENCOUNTER — Encounter: Payer: Self-pay | Admitting: Internal Medicine

## 2015-10-09 ENCOUNTER — Encounter (HOSPITAL_COMMUNITY)
Admission: AD | Admit: 2015-10-09 | Discharge: 2015-10-09 | Disposition: A | Discharge status: Home / Self Care | Payer: Medicaid Other | Source: Skilled Nursing Facility | Attending: Internal Medicine | Admitting: Internal Medicine

## 2015-10-09 DIAGNOSIS — J449 Chronic obstructive pulmonary disease, unspecified: Secondary | ICD-10-CM | POA: Diagnosis not present

## 2015-10-09 LAB — BASIC METABOLIC PANEL
ANION GAP: 9 (ref 5–15)
BUN: 35 mg/dL — AB (ref 6–20)
CHLORIDE: 96 mmol/L — AB (ref 101–111)
CO2: 33 mmol/L — AB (ref 22–32)
Calcium: 9 mg/dL (ref 8.9–10.3)
Creatinine, Ser: 1.69 mg/dL — ABNORMAL HIGH (ref 0.61–1.24)
GFR calc Af Amer: 50 mL/min — ABNORMAL LOW (ref >60)
GFR calc non Af Amer: 43 mL/min — ABNORMAL LOW (ref >60)
GLUCOSE: 139 mg/dL — AB (ref 65–99)
POTASSIUM: 4.1 mmol/L (ref 3.5–5.1)
Sodium: 138 mmol/L (ref 135–145)

## 2015-10-12 ENCOUNTER — Encounter (HOSPITAL_COMMUNITY)
Admission: RE | Admit: 2015-10-12 | Discharge: 2015-10-12 | Disposition: A | Discharge status: Home / Self Care | Payer: Medicaid Other | Source: Skilled Nursing Facility | Attending: Internal Medicine | Admitting: Internal Medicine

## 2015-10-12 ENCOUNTER — Other Ambulatory Visit: Payer: Self-pay | Admitting: *Deleted

## 2015-10-12 DIAGNOSIS — J449 Chronic obstructive pulmonary disease, unspecified: Secondary | ICD-10-CM | POA: Diagnosis not present

## 2015-10-12 LAB — BASIC METABOLIC PANEL
Anion gap: 8 (ref 5–15)
BUN: 34 mg/dL — ABNORMAL HIGH (ref 6–20)
CHLORIDE: 97 mmol/L — AB (ref 101–111)
CO2: 31 mmol/L (ref 22–32)
Calcium: 8.7 mg/dL — ABNORMAL LOW (ref 8.9–10.3)
Creatinine, Ser: 1.58 mg/dL — ABNORMAL HIGH (ref 0.61–1.24)
GFR calc non Af Amer: 47 mL/min — ABNORMAL LOW (ref >60)
GFR, EST AFRICAN AMERICAN: 54 mL/min — AB (ref >60)
Glucose, Bld: 110 mg/dL — ABNORMAL HIGH (ref 65–99)
POTASSIUM: 3.6 mmol/L (ref 3.5–5.1)
SODIUM: 136 mmol/L (ref 135–145)

## 2015-10-12 MED ORDER — OXYCODONE HCL 15 MG PO TABS: ORAL_TABLET | ORAL | Status: DC

## 2015-10-12 NOTE — Telephone Encounter (Signed)
[facility]  

## 2015-10-13 LAB — HEMOGLOBIN A1C
Hgb A1c MFr Bld: 5.9 % — ABNORMAL HIGH (ref 4.8–5.6)
Mean Plasma Glucose: 123 mg/dL

## 2015-10-19 ENCOUNTER — Encounter (HOSPITAL_COMMUNITY)
Admission: RE | Admit: 2015-10-19 | Discharge: 2015-10-19 | Disposition: A | Discharge status: Home / Self Care | Payer: Medicaid Other | Source: Skilled Nursing Facility | Attending: Internal Medicine | Admitting: Internal Medicine

## 2015-10-19 DIAGNOSIS — J449 Chronic obstructive pulmonary disease, unspecified: Secondary | ICD-10-CM | POA: Diagnosis not present

## 2015-10-19 LAB — BASIC METABOLIC PANEL
Anion gap: 10 (ref 5–15)
BUN: 35 mg/dL — AB (ref 4–21)
BUN: 35 mg/dL — ABNORMAL HIGH (ref 6–20)
CALCIUM: 9 mg/dL (ref 8.9–10.3)
CO2: 32 mmol/L (ref 22–32)
CREATININE: 1.62 mg/dL — AB (ref 0.61–1.24)
Chloride: 99 mmol/L — ABNORMAL LOW (ref 101–111)
Creatinine: 1.6 mg/dL — AB (ref <=1.3)
GFR calc non Af Amer: 45 mL/min — ABNORMAL LOW (ref >60)
GFR, EST AFRICAN AMERICAN: 52 mL/min — AB (ref >60)
GLUCOSE: 127 mg/dL — AB (ref 65–99)
Glucose: 127 mg/dL
Potassium: 4.2 mmol/L (ref 3.5–5.1)
Sodium: 141 mmol/L (ref 135–145)
Sodium: 141 mmol/L (ref 137–147)

## 2015-11-06 ENCOUNTER — Ambulatory Visit (INDEPENDENT_AMBULATORY_CARE_PROVIDER_SITE_OTHER): Payer: Medicaid Other | Admitting: Cardiology

## 2015-11-06 ENCOUNTER — Encounter: Payer: Self-pay | Admitting: Cardiology

## 2015-11-06 VITALS — BP 148/92 | HR 66 | Ht 68.0 in | Wt 213.2 lb

## 2015-11-06 DIAGNOSIS — Z9861 Coronary angioplasty status: Secondary | ICD-10-CM

## 2015-11-06 DIAGNOSIS — I10 Essential (primary) hypertension: Secondary | ICD-10-CM

## 2015-11-06 DIAGNOSIS — I739 Peripheral vascular disease, unspecified: Secondary | ICD-10-CM

## 2015-11-06 DIAGNOSIS — I2510 Atherosclerotic heart disease of native coronary artery without angina pectoris: Secondary | ICD-10-CM

## 2015-11-06 NOTE — Patient Instructions (Signed)
Continue your current therapy  I will clear your for vascular surgery  We will follow up in 4 months
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
## 2015-11-07 ENCOUNTER — Other Ambulatory Visit: Payer: Self-pay

## 2015-11-07 MED ORDER — OXYCODONE HCL 15 MG PO TABS: ORAL_TABLET | ORAL | Status: DC

## 2015-11-14 ENCOUNTER — Encounter: Payer: Self-pay | Admitting: Vascular Surgery

## 2015-11-22 ENCOUNTER — Non-Acute Institutional Stay (SKILLED_NURSING_FACILITY): Payer: Medicaid Other | Admitting: Internal Medicine

## 2015-11-22 ENCOUNTER — Encounter: Payer: Self-pay | Admitting: Internal Medicine

## 2015-11-22 ENCOUNTER — Ambulatory Visit (INDEPENDENT_AMBULATORY_CARE_PROVIDER_SITE_OTHER)
Admit: 2015-11-22 | Discharge: 2015-11-22 | Disposition: A | Discharge status: Home / Self Care | Payer: Medicaid Other | Attending: Vascular Surgery | Admitting: Vascular Surgery

## 2015-11-22 ENCOUNTER — Encounter: Payer: Self-pay | Admitting: Vascular Surgery

## 2015-11-22 ENCOUNTER — Ambulatory Visit (INDEPENDENT_AMBULATORY_CARE_PROVIDER_SITE_OTHER): Payer: Medicaid Other | Admitting: Vascular Surgery

## 2015-11-22 ENCOUNTER — Ambulatory Visit (HOSPITAL_COMMUNITY)
Admission: RE | Admit: 2015-11-22 | Discharge: 2015-11-22 | Disposition: A | Discharge status: Home / Self Care | Payer: Medicaid Other | Source: Ambulatory Visit | Attending: Vascular Surgery | Admitting: Vascular Surgery

## 2015-11-22 VITALS — BP 153/80 | HR 54 | Temp 97.1°F | Ht 68.0 in | Wt 218.0 lb

## 2015-11-22 DIAGNOSIS — I6523 Occlusion and stenosis of bilateral carotid arteries: Secondary | ICD-10-CM

## 2015-11-22 DIAGNOSIS — J42 Unspecified chronic bronchitis: Secondary | ICD-10-CM | POA: Diagnosis not present

## 2015-11-22 DIAGNOSIS — R0989 Other specified symptoms and signs involving the circulatory and respiratory systems: Secondary | ICD-10-CM | POA: Diagnosis not present

## 2015-11-22 DIAGNOSIS — I110 Hypertensive heart disease with heart failure: Secondary | ICD-10-CM | POA: Diagnosis not present

## 2015-11-22 DIAGNOSIS — I739 Peripheral vascular disease, unspecified: Secondary | ICD-10-CM

## 2015-11-22 DIAGNOSIS — I999 Unspecified disorder of circulatory system: Secondary | ICD-10-CM | POA: Diagnosis not present

## 2015-11-22 DIAGNOSIS — E785 Hyperlipidemia, unspecified: Secondary | ICD-10-CM | POA: Insufficient documentation

## 2015-11-22 DIAGNOSIS — I509 Heart failure, unspecified: Secondary | ICD-10-CM | POA: Insufficient documentation

## 2015-11-22 DIAGNOSIS — Z720 Tobacco use: Secondary | ICD-10-CM

## 2015-11-22 DIAGNOSIS — R0689 Other abnormalities of breathing: Secondary | ICD-10-CM

## 2015-11-22 NOTE — Assessment & Plan Note (Signed)
Chest x-ray as last film was in December 2015

## 2015-11-22 NOTE — Assessment & Plan Note (Signed)
He will consider risks and his options and get back with me if he decides to pursue either

## 2015-11-22 NOTE — Patient Instructions (Signed)
New orders for [employer] entry.  CXray 1 view

## 2015-11-22 NOTE — Progress Notes (Signed)
   PCP: Unice Cobble, MD   This is a nursing facility follow up of chronic medical diagnoses  Interim medical record and care since last [place] visit was updated with review of diagnostic studies and change in clinical status since last visit were documented.   HPI: His incredible vasculopathic history was reviewed. The excellent summary created by Dr. Martinique, cardiologist was pasted in the problem list for continuity of care. He saw Dr.Fields today for six-month follow up of carotid vascular disease and ABI of the left lower extremity. He states he was told there was no change in the severity of the vascular disease from 6 months ago. The formal report is not complete Femoral endarterectomy is scheduled for 12/11/15  Comprehensive review of systems: Review of systems is positive for edema of the left lower extremity. Cough is productive of clear sputum with brown specks. He denies other respiratory, cardiopulmonary, GI, or GU symptoms. Constitutional: No fever, chills, significant weight change, fatigue, weakness or night sweats Eyes: No redness, discharge, pain ENT/mouth: No nasal congestion, postnasal drainage,epistaxis, purulent discharge, earache Cardiovascular: no chest pain, palpitations, racing, irregular rhythm, syncope Respiratory: No hemoptysis, paroxysmal nocturnal dyspnea, pleuritic chest pain, significant snoring, or  apnea    Gastrointestinal: No heartburn,dysphagia, nausea and vomiting,ominal pain, change in bowels, anorexia, diarrhea, significant constipation, rectal bleeding, melena,  stool incontinence or jaundice Genitourinary: No dysuria,hematuria, pyuria, frequency Dermatologic: No rash, pruritus, urticaria, or change in color or temperature of skin Neurologic: No headache, vertigo, limb weakness Psychiatric: No significant anxiety or depression Endocrine: No change in hair/skin/ nails, excessive thirst, excessive hunger, excessive urination, or  unexplained fatigue Hematologic/lymphatic: No bruising, lymphadenopathy,or  abnormal clotting Allergy/immunology: No itchy/ watery eyes, abnormal sneezing, rhinitis, urticaria ,or angioedema  Physical exam:  Pertinent or positive findings: Pattern alopecia is present. He has a full mustache and beard. Dental hygiene is extremely poor with dental staining and multiple missing teeth. A faint left carotid bruit is present. Rhonchi are noted bilaterally, louder on the right. He has a right AKA. There is dependent rubor of the left lower extremity. The left pedal pulses are absent. He has trace edema at the left ankle.   General appearance:Adequately nourished; no acute distress , increased work of breathing is present.   Lymphatic: No lymphadenopathy about the head, neck, axilla . Eyes: No conjunctival inflammation or lid edema is present. There is no scleral icterus. Ears:  External ear exam shows no significant lesions or deformities.   Nose:  External nasal examination shows no deformity or inflammation. Nasal mucosa are pink and moist without lesions ,exudates Oral exam: lips and gums are healthy appearing.There is no oropharyngeal erythema or exudate . Neck:  No thyromegaly, masses, tenderness noted.    Heart:  Normal rate and regular rhythm. S1 and S2 normal without gallop, murmur, click, rub .  Abdomen:Bowel sounds are normal. Abdomen is soft and nontender with no organomegaly, hernias,masses. I cannot auscultate periumbilical bruits GU: deferred . Extremities:  No cyanosis, clubbing,edema  Neurologic exam : Strength equal  in upper extremities Balance,Rhomberg,finger to nose testing could not be completed due to clinical state Skin: Warm & dry w/o tenting. No significant lesions or rash.    See summary under each active problem in the Problem List with associated updated therapeutic plan
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
## 2015-11-22 NOTE — Assessment & Plan Note (Signed)
Seen by Dr. Oneida Alar 11/22/15; his evaluation and recommendations are pending

## 2015-11-23 ENCOUNTER — Encounter: Payer: Self-pay | Admitting: Internal Medicine

## 2015-11-26 ENCOUNTER — Encounter (HOSPITAL_COMMUNITY)
Admission: RE | Admit: 2015-11-26 | Discharge: 2015-11-26 | Disposition: A | Discharge status: Home / Self Care | Payer: Medicaid Other | Source: Skilled Nursing Facility | Attending: Internal Medicine | Admitting: Internal Medicine

## 2015-11-26 ENCOUNTER — Other Ambulatory Visit: Payer: Self-pay

## 2015-11-26 DIAGNOSIS — Z89511 Acquired absence of right leg below knee: Secondary | ICD-10-CM | POA: Diagnosis present

## 2015-11-26 DIAGNOSIS — J449 Chronic obstructive pulmonary disease, unspecified: Secondary | ICD-10-CM | POA: Insufficient documentation

## 2015-11-26 DIAGNOSIS — K259 Gastric ulcer, unspecified as acute or chronic, without hemorrhage or perforation: Secondary | ICD-10-CM | POA: Diagnosis present

## 2015-11-26 DIAGNOSIS — M10079 Idiopathic gout, unspecified ankle and foot: Secondary | ICD-10-CM | POA: Diagnosis not present

## 2015-11-26 LAB — BASIC METABOLIC PANEL
ANION GAP: 9 (ref 5–15)
BUN: 29 mg/dL — AB (ref 4–21)
BUN: 29 mg/dL — ABNORMAL HIGH (ref 6–20)
CO2: 30 mmol/L (ref 22–32)
CREATININE: 1.5 mg/dL — AB (ref <=1.3)
Calcium: 9.1 mg/dL (ref 8.9–10.3)
Chloride: 98 mmol/L — ABNORMAL LOW (ref 101–111)
Creatinine, Ser: 1.47 mg/dL — ABNORMAL HIGH (ref 0.61–1.24)
GFR, EST AFRICAN AMERICAN: 59 mL/min — AB (ref >60)
GFR, EST NON AFRICAN AMERICAN: 50 mL/min — AB (ref >60)
GLUCOSE: 93 mg/dL (ref 65–99)
Glucose: 93 mg/dL
POTASSIUM: 4 mmol/L (ref 3.5–5.1)
Sodium: 137 mmol/L (ref 135–145)
Sodium: 137 mmol/L (ref 137–147)

## 2015-12-05 ENCOUNTER — Non-Acute Institutional Stay (SKILLED_NURSING_FACILITY): Payer: Medicaid Other | Admitting: Internal Medicine

## 2015-12-05 ENCOUNTER — Other Ambulatory Visit (HOSPITAL_COMMUNITY)
Admission: RE | Admit: 2015-12-05 | Discharge: 2015-12-05 | Disposition: A | Discharge status: Home / Self Care | Payer: Medicaid Other | Source: Skilled Nursing Facility | Attending: Internal Medicine | Admitting: Internal Medicine

## 2015-12-05 DIAGNOSIS — R609 Edema, unspecified: Secondary | ICD-10-CM

## 2015-12-05 DIAGNOSIS — N289 Disorder of kidney and ureter, unspecified: Secondary | ICD-10-CM

## 2015-12-05 DIAGNOSIS — I10 Essential (primary) hypertension: Secondary | ICD-10-CM | POA: Diagnosis not present

## 2015-12-05 DIAGNOSIS — S81802A Unspecified open wound, left lower leg, initial encounter: Secondary | ICD-10-CM | POA: Diagnosis not present

## 2015-12-05 DIAGNOSIS — R238 Other skin changes: Secondary | ICD-10-CM | POA: Diagnosis not present

## 2015-12-05 NOTE — Progress Notes (Signed)
Patient ID: Edwin [name], male   DOB: [birth year], 60 y.o.   MRN: [id number]   Location:  [facility] Room Number: 107 Place of Service:  SNF [phone]) Provider:  Leeanne Deed, MD  Patient Care Team: Hendricks Limes, MD as PCP - General (Internal Medicine)  Extended Emergency Contact Information Primary Emergency Contact: [place], [place] [address] [place] Mobile Phone: [phone] Relation: Daughter  Code Status:  Full Goals of care: Advanced Directive information Advanced Directives 12/05/2015  Does patient have an advance directive? Yes  Type of Advance Directive (No Data)  Does patient want to make changes to advanced directive? No - Patient declined  Copy of advanced directive(s) in chart? Yes  Would patient like information on creating an advanced directive? -     Chief Complaint  Patient presents with  . Acute Visit    Complains of Right lower leg scab and blister. No drainage.    HPI:  Pt is a 60 y.o. male seen today for an acute visit for  Blisters on left lower extremity-patient does have a history of peripheral vascular disease status post right above-the-knee amputation.  Apparently blisters appeared several days ago-- patient did have an order for wrapping of his legs but he refused the so this has been discontinued--  Patient does go outside quite often and I do note he has signs of sun exposure on his exposed right stump as well as his entire left leg.  At this point I do not see signs of cellulitis or drainage there is not really increased warmth from baseline.  He does have venous stasis changes of his leg.     Past Medical History  Diagnosis Date  . ERECTILE DYSFUNCTION 11/27/2007    Qualifier: Diagnosis of  By: Garen Grams    . HYPERLIPIDEMIA 11/27/2007    Qualifier: Diagnosis of  By: Garen Grams    . HYPERTENSION, BENIGN 05/23/2009    Qualifier: Diagnosis of  By: Melvyn Novas  MD, Christena Deem   . Coronary artery disease     Cardiac catheterization in 2008 showed 99% stenosis in proximal left circumflex which was treated with Taxus drug-eluting stent. There was mild RCA and LAD disease with normal ejection fraction.  . Tobacco use   . CHF (congestive heart failure) ([facility])   . Hypertension   . Asthma   . Traumatic amputation of leg(s) (complete) (partial), unilateral, at or above knee, without mention of complication   . Pressure ulcer, lower back(707.03)   . Other severe protein-calorie malnutrition   . Anemia, unspecified   . Atrial fibrillation ([place])   . Gastric ulcer, unspecified as acute or chronic, without mention of hemorrhage, perforation, or obstruction   . PAD (peripheral artery disease) ([facility])     Previous right SFA stent in 2003. Directional atherectomy right SFA in 01/2013  . Carotid artery occlusion   . Heart murmur   . COPD 11/27/2007    Qualifier: Diagnosis of  By: Garen Grams    . Pneumonia 2000  . Arthritis     "shoulders" (09/22/2014)   Past Surgical History  Procedure Laterality Date  . Nasal sinus surgery  2004  . Acne cyst removal    . Femoral artery stent Right 2003    [name] 09/22/2014  . Endarterectomy Left 08/16/2013    Procedure: Exploration of Left Neck/Fix Bleeding;  Surgeon: Elam Dutch, MD;  Location: [facility]  OR;  Service: Vascular;  Laterality: Left;  . Esophagogastroduodenoscopy N/A 09/08/2013    Procedure: ESOPHAGOGASTRODUODENOSCOPY (EGD);  Surgeon: Cleotis Nipper, MD;  Location: [facility] ENDOSCOPY;  Service: Endoscopy;  Laterality: N/A;  . Flexible sigmoidoscopy N/A 09/08/2013    Procedure: FLEXIBLE SIGMOIDOSCOPY;  Surgeon: Cleotis Nipper, MD;  Location: [facility] ENDOSCOPY;  Service: Endoscopy;  Laterality: N/A;  unprepp  . Amputation Right 09/09/2013    Procedure: AMPUTATION ABOVE KNEE;  Surgeon: Rosetta Posner, MD;  Location: [facility] OR;  Service: Vascular;  Laterality: Right;  . Esophagogastroduodenoscopy (egd) with propofol N/A 01/05/2014     Procedure: ESOPHAGOGASTRODUODENOSCOPY (EGD) WITH PROPOFOL;  Surgeon: Cleotis Nipper, MD;  Location: [facility] ENDOSCOPY;  Service: Endoscopy;  Laterality: N/A;  . Parotidectomy Right 03/29/2014    Procedure: PAROTIDECTOMY;  Surgeon: Ascencion Dike, MD;  Location: [place];  Service: ENT;  Laterality: Right;  . Abdominal aortagram N/A 02/23/2013    Procedure: ABDOMINAL Maxcine Ham;  Surgeon: Wellington Hampshire, MD;  Location: [facility] CATH LAB;  Service: Cardiovascular;  Laterality: N/A;  . Atherectomy N/A 03/02/2013    Procedure: ATHERECTOMY;  Surgeon: Wellington Hampshire, MD;  Location: [facility] CATH LAB;  Service: Cardiovascular;  Laterality: N/A;  . Iliac artery stent Left 09/22/2014  . Abdominal aortagram N/A 09/22/2014    Procedure: ABDOMINAL Maxcine Ham;  Surgeon: Elam Dutch, MD;  Location: [facility] CATH LAB;  Service: Cardiovascular;  Laterality: N/A;  . Peripheral vascular catheterization N/A 08/17/2015    Procedure: Abdominal Aortogram;  Surgeon: Elam Dutch, MD;  Location: [place] CV LAB;  Service: Cardiovascular;  Laterality: N/A;  . Coronary angioplasty  09/03/2015  . Cardiac catheterization N/A 09/03/2015    Procedure: Left Heart Cath and Coronary Angiography;  Surgeon: Leonie Man, MD;  Location: [facility] CV LAB;  Service: Cardiovascular;  Laterality: N/A;  . Cardiac catheterization N/A 09/03/2015    Procedure: Coronary Balloon Angioplasty;  Surgeon: Leonie Man, MD;  Location: [place] CV LAB;  Service: Cardiovascular;  Laterality: N/A;    Allergies  Allergen Reactions  . Codeine Nausea Only      Medication List    Notice    This visit is during an admission. Changes to the med list made in this visit will be reflected in the After Visit Summary of the admission.     Outpatient Encounter Prescriptions as of 12/05/2015  Medication Sig  . aspirin EC 81 MG tablet Take 1 tablet (81 mg total) by mouth daily.  . cloNIDine (CATAPRES) 0.2 MG tablet Take 0.2 mg by mouth 2 (two) times daily.  .  colchicine 0.6 MG tablet Take 0.6 mg by mouth daily.   . Fluticasone-Salmeterol (ADVAIR) 250-50 MCG/DOSE AEPB Inhale 1 puff into the lungs 2 (two) times daily.  . furosemide (LASIX) 20 MG tablet Take 20 mg by mouth daily. Sunday, Tuesday, Thursday, Saturday  . furosemide (LASIX) 40 MG tablet Take 40 mg by mouth daily. Monday and Wednesday  . isosorbide mononitrate (IMDUR) 30 MG 24 hr tablet Take 1 tablet (30 mg total) by mouth daily.  Marland Kitchen lisinopril (PRINIVIL,ZESTRIL) 40 MG tablet Take 40 mg by mouth daily. Reported on 11/22/2015  . metoprolol tartrate (LOPRESSOR) 25 MG tablet Take 1 tablet (25 mg total) by mouth 2 (two) times daily.  Marland Kitchen oxyCODONE (ROXICODONE) 15 MG immediate release tablet Take one tablet by mouth every 4 hours as needed for pain  . pantoprazole (PROTONIX) 40 MG tablet Take 40 mg by mouth daily.  . potassium chloride SA (  K-DUR,KLOR-CON) 20 MEQ tablet Take 20 mEq by mouth daily.   . simvastatin (ZOCOR) 40 MG tablet Take 40 mg by mouth daily.  . temazepam (RESTORIL) 30 MG capsule Take one capsule by mouth at bedtime as needed for insomnia  . [DISCONTINUED] pantoprazole (PROTONIX) 20 MG tablet Take 20 mg by mouth daily.  . [DISCONTINUED] acetaminophen (TYLENOL) 325 MG tablet Take 2 tablets (650 mg total) by mouth every 6 (six) hours as needed for mild pain, fever or headache.  . [DISCONTINUED] clopidogrel (PLAVIX) 75 MG tablet Take 1 tablet (75 mg total) by mouth daily.   No facility-administered encounter medications on file as of 12/05/2015.   Review of Systems   Denies any fever or chills in general.  Skin issues as noted above.  Head ears eyes nose mouth and throat does not complain of any sore throat visual changes.  Respiratory does not complaining shortness breath or cough.  Cardiac no chest pain has baseline lower extremity edema.  GI does not complain of nausea vomiting diarrhea constipation or abdominal discomfort.  Muscle skeletal is not currently complaining of  joint pain.  Neurologic is not complaining of dizziness headache or syncopal-type feelings.  Psych is not complaining of depression or anxiety currently  Physical exam.  Vital signs as noted below  In general this is a pleasant middle-aged male in no distress resting comfortably in bed.    Skin is warm and dry he does have small vesicular type lesions I would say approximately 4 shin area neuropathy venous stasis changes he also has blisters noted on a sliding area he says these are stable and chronic I do not see any drainage bleeding or signs of cellulitis at this time there are venous stasis changes.  Heart distant heart sounds regular rate and rhythm without murmur gallop or rub from what I can hear he has 1+ lower extremity edema again he has refused wraps in the past.  Chest is clear to auscultation there is no labored breathing.  Abdomen soft somewhat obese nontender positive bowel sounds.  Musculoskeletal he uses right above-the-knee amputation otherwise moves his extremities at baseline.  Neurologic is grossly intact to speech is clear.  Psych he is alert and oriented pleasant and appropriate   Immunization History  Administered Date(s) Administered  . DTP 05/28/2001  . Influenza-Unspecified 05/26/2013, 05/04/2014  . Tdap 12/30/2012   Pertinent  Health Maintenance Due  Topic Date Due  . COLONOSCOPY  11/24/2006  . INFLUENZA VACCINE  02/26/2016   Fall Risk  12/30/2012  Falls in the past year? No   Functional Status Survey:    Filed Vitals:   12/05/15 1000  BP: 138/65  Pulse: 66  Temp: 97.6 F (36.4 C)  TempSrc: Oral  Resp: 20  Height: 5\' 8"  (1.727 m)  Weight: 218 lb (98.884 kg)  SpO2: 100%   Body mass index is 33.15 kg/(m^2). Physical Exam Please see above  Labs reviewed:  Recent Labs  10/12/15 0705  10/19/15 0710 11/26/15 11/26/15 0600  NA 136  < > 141 137 137  K 3.6  --  4.2  --  4.0  CL 97*  --  99*  --  98*  CO2 31  --  32  --  30    GLUCOSE 110*  --  127*  --  93  BUN 34*  < > 35* 29* 29*  CREATININE 1.58*  < > 1.62* 1.5* 1.47*  CALCIUM 8.7*  --  9.0  --  9.1  < > =  values in this interval not displayed.  Recent Labs  07/11/15 0900 08/13/15 0600 08/29/15 1055  AST 15 16 19   ALT 13* 13* 15*  ALKPHOS 55 63 63  BILITOT 0.4 0.5 0.3  PROT 6.4* 7.4 6.7  ALBUMIN 3.8 4.4 3.8    Recent Labs  05/23/15 0545 07/11/15 0900  08/29/15 1055 09/04/15 0615 09/24/15 09/24/15 0300  WBC 7.0 6.7  --  8.9 8.5 7.7 7.7  NEUTROABS 4.2 3.7  --   --   --   --  4.8  HGB 15.1 15.4  < > 15.6 15.1  --  14.9  HCT 45.8 45.9  < > 47.4 45.7  --  45.6  MCV 85.1 88.4  --  92.8 92.0  --  93.4  PLT 156 167  --  190 171  --  177  < > = values in this interval not displayed. Lab Results  Component Value Date   TSH 3.881 09/13/2013   Lab Results  Component Value Date   HGBA1C 5.9* 10/12/2015   Lab Results  Component Value Date   CHOL 114 08/13/2015   HDL 27* 08/13/2015   LDLCALC 36 08/13/2015   TRIG 254* 08/13/2015   CHOLHDL 4.2 08/13/2015    Significant Diagnostic Results in last 30 days:  No results found.  Assessment/Plan #1 left lower extremity blisters-at this point will monitor this was discussed with nursing as well as wound care nurse will monitor for any drainage bleeding or sign of cellulitis this may be somewhat edema related again patient has been hesitant to use wraps in the past and thus this has been discontinued this will have to be monitored but this point I do not see an acute cellulitic infectious process.  #2 history of renal insufficiency this appears baseline with May 1 creatinine of 1.47-he does continue on Lasix but this appears to have stabilized despite diuretic use.  #3 history of edema---appears stable--- weight is relatively stable at 2:15 he was approximately 213 2 months ago at this point will monitor he is on Lasix 40 mg 2 times a week and 20 mg the other days as well as potassium  supplementation.  #4-history hypertension he is on numerous agents including lisinopril 40 mg a day Lopressor 25 mg twice a day Lasix as noted above--also on clonidine 0.2 mg twice a day Is appears to have stabilized recently pressures 126/73-138/65.  [id number]

## 2015-12-08 LAB — WOUND CULTURE

## 2015-12-10 NOTE — Progress Notes (Signed)
Edwin [name] returned my call around 4:50 PM stating that Dr. Oneida Alar would be going ahead with the surgery since the wound that is positive for MRSA is on pt's ankle and Dr. Oneida Alar will be working in the groin area. I called [place] and spoke with [facility] and she states pt is still not wanting the surgery tomorrow, she had Adonis Huguenin, shift coordinator talk to me. She states pt is alert and oriented and does not want to have surgery until he finishes the antibiotics. I told her that I understood, but I cannot cancel a surgery. Instructed her to call Dr. Oneida Alar' office and it will go to their answering service and to ask for the surgeon on call. I explained to her that it might not be Dr. Oneida Alar, but to explain to whomever calls back what is going on. I also asked her to call me back to let me know what the decision is. She states she would.

## 2015-12-10 NOTE — Progress Notes (Signed)
Pt is a resident at [facility]. Spoke with Marlene Bast, nurse for pt. She states pt just told her that he had told "someone" earlier today to call and cancel his surgery because he has MRSA in his leg. I've called and left message on voicemail at [facility] making sure that they are aware of this.

## 2015-12-10 NOTE — Progress Notes (Signed)
Adonis Huguenin from [facility] called and said she had spoken with Dr. Bridgett Larsson and he wanted the pt to have the surgery. She states the Mr. Lachman DOES NOT want to have the surgery tomorrow, wants to wait until the antibiotic is finished and is NOT coming tomorrow and wants to be rescheduled in 10 days. I told her that I would let the OR know that pt is not coming but someone from the nursing center will need to call Dr. Oneida Alar' office tomorrow for pt to be rescheduled. She voiced understanding. I notified Rhonda at the main OR desk that pt will not be having his surgery in the AM. I will call Dr. Oneida Alar in the AM to let him know.

## 2015-12-11 ENCOUNTER — Inpatient Hospital Stay (HOSPITAL_COMMUNITY): Admission: RE | Admit: 2015-12-11 | Payer: Medicaid Other | Source: Ambulatory Visit | Admitting: Vascular Surgery

## 2015-12-11 ENCOUNTER — Encounter (HOSPITAL_COMMUNITY): Admission: RE | Payer: Self-pay | Source: Ambulatory Visit

## 2015-12-11 SURGERY — ENDARTERECTOMY, FEMORAL
Anesthesia: General | Laterality: Left

## 2015-12-11 MED ORDER — FENTANYL CITRATE (PF) 250 MCG/5ML IJ SOLN
INTRAMUSCULAR | Status: AC
  Filled 2015-12-11: qty 5

## 2015-12-11 MED ORDER — PROTAMINE SULFATE 10 MG/ML IV SOLN
INTRAVENOUS | Status: AC
  Filled 2015-12-11: qty 5

## 2015-12-11 MED ORDER — PROPOFOL 10 MG/ML IV BOLUS
INTRAVENOUS | Status: AC
  Filled 2015-12-11: qty 20

## 2015-12-11 MED ORDER — MIDAZOLAM HCL 2 MG/2ML IJ SOLN
INTRAMUSCULAR | Status: AC
  Filled 2015-12-11: qty 2

## 2015-12-11 MED ORDER — ONDANSETRON HCL 4 MG/2ML IJ SOLN
INTRAMUSCULAR | Status: AC
  Filled 2015-12-11: qty 2

## 2015-12-12 ENCOUNTER — Other Ambulatory Visit: Payer: Self-pay

## 2015-12-12 MED ORDER — OXYCODONE HCL 15 MG PO TABS: ORAL_TABLET | ORAL | Status: DC

## 2015-12-12 NOTE — Telephone Encounter (Signed)
RX faxed to [facility] @ [phone]. Phone number [phone]  

## 2016-01-02 ENCOUNTER — Telehealth: Payer: Self-pay | Admitting: Vascular Surgery

## 2016-01-02 NOTE — Telephone Encounter (Signed)
-----   Message from Denman George, RN sent at 12/31/2015 11:31 AM EDT ----- Regarding: needs appt. with Dr. Oneida Alar Pt resides at [facility] @ [phone]; his surgery was cancelled for 5/16; hasn't seen Dr. Oneida Alar since 11/22/15; needs an office visit to update H & P, and reschedule surgery.  Please contact [facility] to schedule office appt.  Thanks.

## 2016-01-02 NOTE — Telephone Encounter (Signed)
Sched appt 6/15 at 11:15. Spoke to nursing hm to inform them of appt.

## 2016-01-03 ENCOUNTER — Non-Acute Institutional Stay (SKILLED_NURSING_FACILITY): Payer: Medicaid Other | Admitting: Internal Medicine

## 2016-01-03 ENCOUNTER — Encounter: Payer: Self-pay | Admitting: Internal Medicine

## 2016-01-03 ENCOUNTER — Encounter: Payer: Self-pay | Admitting: Vascular Surgery

## 2016-01-03 DIAGNOSIS — M25511 Pain in right shoulder: Secondary | ICD-10-CM | POA: Diagnosis not present

## 2016-01-03 NOTE — Patient Instructions (Signed)
Naproxen 500 mg twice a day after meals Orthopedic consultation; rule out partial rotator cuff tear

## 2016-01-03 NOTE — Progress Notes (Signed)
   Facility Location: [place] Room Number: 107-W   This is a nursing facility follow up for specific acute issue.  Interim medical record and care since last [place] visit was updated with review of diagnostic studies and change in clinical status since last visit were documented.  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 He's not had an orthopedic evaluation of his shoulder. It is described as aching and throbbing and sharp with elevation. He does have a history of gout and is on colchicine.  Review of systems: Musculoskeletal: No joint stiffness, joint swelling, weakness  Dermatologic: No rash, pruritus, change in appearance of skin @ site of pain Neurologic: No associated right upper extremity numbness , tingling  Physical exam:  Pertinent or positive findings: Strong tobacco odor . Pattern alopecia is present. He is a full beard and mustache. He has scattered low-grade rhonchi His strength in the upper extremities is good. He describes sharp pain with elevation of the right upper extremity to shoulder level. He cannot raise his arm above shoulder level. He has an AK amputation on the right. There is brawny erythema of the left lower extremity. He has tense edema of the left lower extremity. General appearance:Adequately nourished; no acute distress , increased work of breathing is present.   Lymphatic: No lymphadenopathy about the head, neck, axilla . Eyes: No conjunctival inflammation or lid edema is present. There is no scleral icterus. Neck:  No thyromegaly, masses, tenderness noted.Full ROM    Heart:  Normal rate and regular rhythm. S1 and S2 normal without gallop, murmur, click, rub . Marland Kitchen Extremities:  No cyanosis, clubbing  Neurologic  exam : Strength equal  in upper & lower extremities Balance,Rhomberg,finger to nose testing could not be completed due to clinical state Deep tendon reflexes are equal in UE Skin: Warm & dry w/o tenting. No rash.    #1 chronic  right shoulder pain; history and physical suggest partial rotator cuff tear Plan: Naprosyn bid Orthopedic consult

## 2016-01-07 ENCOUNTER — Telehealth: Payer: Self-pay | Admitting: Radiology

## 2016-01-09 ENCOUNTER — Ambulatory Visit: Payer: Self-pay | Admitting: Orthopaedic Surgery

## 2016-01-09 NOTE — Telephone Encounter (Signed)
noted 

## 2016-01-10 ENCOUNTER — Encounter: Payer: Self-pay | Admitting: Vascular Surgery

## 2016-01-10 ENCOUNTER — Ambulatory Visit: Payer: Self-pay | Admitting: Vascular Surgery

## 2016-01-16 ENCOUNTER — Non-Acute Institutional Stay (SKILLED_NURSING_FACILITY): Payer: Medicaid Other | Admitting: Internal Medicine

## 2016-01-16 ENCOUNTER — Encounter: Payer: Self-pay | Admitting: Internal Medicine

## 2016-01-16 DIAGNOSIS — I10 Essential (primary) hypertension: Secondary | ICD-10-CM | POA: Diagnosis not present

## 2016-01-16 DIAGNOSIS — I739 Peripheral vascular disease, unspecified: Secondary | ICD-10-CM

## 2016-01-16 DIAGNOSIS — J42 Unspecified chronic bronchitis: Secondary | ICD-10-CM

## 2016-01-16 DIAGNOSIS — G8929 Other chronic pain: Secondary | ICD-10-CM | POA: Diagnosis not present

## 2016-01-16 DIAGNOSIS — I2510 Atherosclerotic heart disease of native coronary artery without angina pectoris: Secondary | ICD-10-CM | POA: Diagnosis not present

## 2016-01-16 NOTE — Progress Notes (Signed)
Location:  [place] Room Number: 107/W Place of Service:  SNF [phone]) Provider:  Leeanne Deed, MD  Patient Care Team: Hendricks Limes, MD as PCP - General (Internal Medicine)  Extended Emergency Contact Information Primary Emergency Contact: [place], [place] [address] [place] Mobile Phone: [phone] Relation: Daughter  Code Status:  Full Code Goals of care: Advanced Directive information Advanced Directives 01/16/2016  Does patient have an advance directive? Yes  Type of Advance Directive -  Does patient want to make changes to advanced directive? No - Patient declined  Copy of advanced directive(s) in chart? Yes     Chief Complaint  Patient presents with  . Medical Management of Chronic Issues    Routine visit     HPI:  Pt is a 60 y.o. male seen For medical management of chronic medical issues including hypertension-renal insufficiency-history of lower extremity edema-history of coronary artery disease with ischemic cardiomyopathy-history of peripheral vascular disease.--- He appears to be at his baseline today he is followed closely by vascular physician and recently underwent a femoral endarterectomy-secondary to severe peripheral vascular disease.  He is status post right AKA  He also saw Dr. Oneida Alar previously with her history of carotid vein disease also has had ABIs of his left lower extremity which is followed by vascular.  Patient did have MRSA appreciated from a wound in his left lower extremity recently and has completed a course of doxycycline apparently this has stabilized.  Most acute issue is right shoulder discomfort which has been assessed previously by Dr. Linna Darner he's been started on Naprosyn-is also receiving oxycodone 15 mg every 4 hours as needed he takes this quite frequently he does have an orthopedic consult pending.  Patient has a history of COPD continues on Advair twice a  day-he continues to smoke although he's been advised against this patient has been adamant that he continues smoking.  Has a history of hypertension we have titrated up his blood pressure medicines this appears to show stability now he is on lisinopril 40 mg a day Lopressor 25 mg twice a day and clonidine 0.2 mg twice a day recent blood pressures 130/80-140/80 128/80 162/84 and appears after he goes out to smoke that his blood pressures jump temporarily I do not see consistent elevations however.  Patient also has a history of suspected ischemic cardiomyopathy he is on Lasix currently 20 mg a day 4 days a week and 40 mg a day 3 days a week-edema appears to be at baseline of his left lower leg he does have some chronic edema here.  This is complicated with a history of renal insufficiency but this appears to stabilize most recent creatinine was 1. or 7 on 11/26/2015 we will update this.         Past Medical History  Diagnosis Date  . ERECTILE DYSFUNCTION 11/27/2007    Qualifier: Diagnosis of  By: Garen Grams    . HYPERLIPIDEMIA 11/27/2007    Qualifier: Diagnosis of  By: Garen Grams    . HYPERTENSION, BENIGN 05/23/2009    Qualifier: Diagnosis of  By: Melvyn Novas MD, Christena Deem   . Coronary artery disease     Cardiac catheterization in 2008 showed 99% stenosis in proximal left circumflex which was treated with Taxus drug-eluting stent. There was mild RCA and LAD disease with normal ejection fraction.  . Tobacco use   . CHF (congestive heart failure) ([place])   .  Hypertension   . Asthma   . Traumatic amputation of leg(s) (complete) (partial), unilateral, at or above knee, without mention of complication   . Pressure ulcer, lower back(707.03)   . Other severe protein-calorie malnutrition   . Anemia, unspecified   . Atrial fibrillation ([place])   . Gastric ulcer, unspecified as acute or chronic, without mention of hemorrhage, perforation, or obstruction   . PAD (peripheral artery disease) ([facility])      Previous right SFA stent in 2003. Directional atherectomy right SFA in 01/2013  . Carotid artery occlusion   . Heart murmur   . COPD 11/27/2007    Qualifier: Diagnosis of  By: Garen Grams    . Pneumonia 2000  . Arthritis     "shoulders" (09/22/2014)   Past Surgical History  Procedure Laterality Date  . Nasal sinus surgery  2004  . Acne cyst removal    . Femoral artery stent Right 2003    [name] 09/22/2014  . Endarterectomy Left 08/16/2013    Procedure: Exploration of Left Neck/Fix Bleeding;  Surgeon: Elam Dutch, MD;  Location: [facility] OR;  Service: Vascular;  Laterality: Left;  . Esophagogastroduodenoscopy N/A 09/08/2013    Procedure: ESOPHAGOGASTRODUODENOSCOPY (EGD);  Surgeon: Cleotis Nipper, MD;  Location: [facility] ENDOSCOPY;  Service: Endoscopy;  Laterality: N/A;  . Flexible sigmoidoscopy N/A 09/08/2013    Procedure: FLEXIBLE SIGMOIDOSCOPY;  Surgeon: Cleotis Nipper, MD;  Location: [facility] ENDOSCOPY;  Service: Endoscopy;  Laterality: N/A;  unprepp  . Amputation Right 09/09/2013    Procedure: AMPUTATION ABOVE KNEE;  Surgeon: Rosetta Posner, MD;  Location: [facility] OR;  Service: Vascular;  Laterality: Right;  . Esophagogastroduodenoscopy (egd) with propofol N/A 01/05/2014    Procedure: ESOPHAGOGASTRODUODENOSCOPY (EGD) WITH PROPOFOL;  Surgeon: Cleotis Nipper, MD;  Location: [facility] ENDOSCOPY;  Service: Endoscopy;  Laterality: N/A;  . Parotidectomy Right 03/29/2014    Procedure: PAROTIDECTOMY;  Surgeon: Ascencion Dike, MD;  Location: [place];  Service: ENT;  Laterality: Right;  . Abdominal aortagram N/A 02/23/2013    Procedure: ABDOMINAL Maxcine Ham;  Surgeon: Wellington Hampshire, MD;  Location: [facility] CATH LAB;  Service: Cardiovascular;  Laterality: N/A;  . Atherectomy N/A 03/02/2013    Procedure: ATHERECTOMY;  Surgeon: Wellington Hampshire, MD;  Location: [facility] CATH LAB;  Service: Cardiovascular;  Laterality: N/A;  . Iliac artery stent Left 09/22/2014  . Abdominal aortagram N/A 09/22/2014    Procedure: ABDOMINAL Maxcine Ham;   Surgeon: Elam Dutch, MD;  Location: [facility] CATH LAB;  Service: Cardiovascular;  Laterality: N/A;  . Peripheral vascular catheterization N/A 08/17/2015    Procedure: Abdominal Aortogram;  Surgeon: Elam Dutch, MD;  Location: [place] CV LAB;  Service: Cardiovascular;  Laterality: N/A;  . Coronary angioplasty  09/03/2015  . Cardiac catheterization N/A 09/03/2015    Procedure: Left Heart Cath and Coronary Angiography;  Surgeon: Leonie Man, MD;  Location: [place] CV LAB;  Service: Cardiovascular;  Laterality: N/A;  . Cardiac catheterization N/A 09/03/2015    Procedure: Coronary Balloon Angioplasty;  Surgeon: Leonie Man, MD;  Location: [place] CV LAB;  Service: Cardiovascular;  Laterality: N/A;    Allergies  Allergen Reactions  . Codeine Nausea Only    Current Outpatient Prescriptions on File Prior to Visit  Medication Sig Dispense Refill  . aspirin EC 81 MG tablet Take 1 tablet (81 mg total) by mouth daily. 150 tablet 2  . cloNIDine (CATAPRES) 0.2 MG tablet Take 0.2 mg by mouth 2 (two) times daily.    Marland Kitchen  clopidogrel (PLAVIX) 75 MG tablet Take 75 mg by mouth daily. @@ 9:00 pm    . colchicine 0.6 MG tablet Take 0.6 mg by mouth daily.     . Fluticasone-Salmeterol (ADVAIR) 250-50 MCG/DOSE AEPB Inhale 1 puff into the lungs 2 (two) times daily.    . furosemide (LASIX) 20 MG tablet Take 20 mg by mouth daily. Sunday, Tuesday, Thursday, Saturday    . furosemide (LASIX) 40 MG tablet Take 40 mg by mouth 3 (three) times a week. Mon, Wed and Fri    . isosorbide mononitrate (IMDUR) 30 MG 24 hr tablet Take 1 tablet (30 mg total) by mouth daily. 30 tablet 5  . lisinopril (PRINIVIL,ZESTRIL) 40 MG tablet Take 40 mg by mouth daily. Reported on 11/22/2015    . metoprolol tartrate (LOPRESSOR) 25 MG tablet Take 1 tablet (25 mg total) by mouth 2 (two) times daily. 60 tablet 5  . oxyCODONE (ROXICODONE) 15 MG immediate release tablet Take one tablet by mouth every 4 hours as needed for pain 180  tablet 0  . pantoprazole (PROTONIX) 40 MG tablet Take 40 mg by mouth daily.    . potassium chloride SA (K-DUR,KLOR-CON) 20 MEQ tablet Take 20 mEq by mouth daily.     . simvastatin (ZOCOR) 40 MG tablet Take 40 mg by mouth daily.    . temazepam (RESTORIL) 30 MG capsule Take one capsule by mouth at bedtime as needed for insomnia 30 capsule 5   No current facility-administered medications on file prior to visit.     Review of Systems  In general is not complaining any fever or chills.  Skin does not currently complain of rashes or itching he does have brawny erythema with lower extremity and stop site again he does have significant sun exposure when he goes out to smoke.  Head ears eyes nose mouth and throat does not complaining of a nasal discharge visual changes sore throat.  Respiratory as per bronchitis COPD but is not complaining of any increased shortness of breath or increased cough.  Cardiac not complaining of chest pain has chronic lower extremity edema on the left.  GI does not complain of nausea vomiting diarrhea constipation or abdominal discomfort.  Musculoskeletal continues to complain of right shoulder pain is on oxycodone as well as Naprosyn as noted above.  Neurologic is not complaining of dizziness headache or syncopal episodes.  Psych is not complaining of overt anxiety or depression appears to be doing well with his routine that he has established here  Immunization History  Administered Date(s) Administered  . DTP 05/28/2001  . Influenza-Unspecified 05/26/2013, 05/04/2014  . Pneumococcal-Unspecified 07/28/2009  . Tdap 12/30/2012   Pertinent  Health Maintenance Due  Topic Date Due  . COLONOSCOPY  11/22/2016 (Originally 11/24/2006)  . INFLUENZA VACCINE  02/26/2016   Fall Risk  12/30/2012  Falls in the past year? No   Functional Status Survey:      Physical Exam   Currently he is afebrile pulse 60 respirations 18 blood pressure 130/80.  In general this  is a well-nourished middle-aged male in no distress sitting comfortably on the side of his bed he is brought to eat lunch area  His skin is warm and dry does have pattern alopecia  .  His skin is warm and dry he does have brawny erythema most noticeable left lower extremity also some erythema appears to be more severe sun exposure related right stump area.  Oropharynx clear mucous membranes moist.  Chest he does have some  diffuse rhonchi which appears to be baseline there is no labored breathing.  Heart is regular rate and rhythm without murmur gallop or rub he has baseline tense edema of his left lower extremity this appears to be unchanged from previous exam.  Abdomen is obese soft nontender with positive bowel sounds.  Musculoskeletal is status post right AKA-moves all other extremities at baseline-ambulates in a wheelchair.  Neurologic is grossly intact no lateralizing findings her speech is clear.  Psych he is alert and oriented pleasant and appropriate.    Labs reviewed:  Recent Labs  10/12/15 0705  10/19/15 0710 11/26/15 11/26/15 0600  NA 136  < > 141 137 137  K 3.6  --  4.2  --  4.0  CL 97*  --  99*  --  98*  CO2 31  --  32  --  30  GLUCOSE 110*  --  127*  --  93  BUN 34*  < > 35* 29* 29*  CREATININE 1.58*  < > 1.62* 1.5* 1.47*  CALCIUM 8.7*  --  9.0  --  9.1  < > = values in this interval not displayed.  Recent Labs  07/11/15 0900 08/13/15 0600 08/29/15 1055  AST 15 16 19   ALT 13* 13* 15*  ALKPHOS 55 63 63  BILITOT 0.4 0.5 0.3  PROT 6.4* 7.4 6.7  ALBUMIN 3.8 4.4 3.8    Recent Labs  05/23/15 0545 07/11/15 0900  08/29/15 1055 09/04/15 0615 09/24/15 09/24/15 0300  WBC 7.0 6.7  --  8.9 8.5 7.7 7.7  NEUTROABS 4.2 3.7  --   --   --   --  4.8  HGB 15.1 15.4  < > 15.6 15.1  --  14.9  HCT 45.8 45.9  < > 47.4 45.7  --  45.6  MCV 85.1 88.4  --  92.8 92.0  --  93.4  PLT 156 167  --  190 171  --  177  < > = values in this interval not displayed. Lab  Results  Component Value Date   TSH 3.881 09/13/2013   Lab Results  Component Value Date   HGBA1C 5.9* 10/12/2015   Lab Results  Component Value Date   CHOL 114 08/13/2015   HDL 27* 08/13/2015   LDLCALC 36 08/13/2015   TRIG 254* 08/13/2015   CHOLHDL 4.2 08/13/2015    Significant Diagnostic Results in last 30 days:  No results found.  Assessment/Plan  #1-hypertension this appears to be under better control on the clonidine lisinopril and Lopressor at this point will continue monitor occasional systolics spikes but I suspect this is more to activity in smoking again patient has been advised about his smoking but has been adamant about continuing this.  #2 history of COPD bronchitis this appears stable he continues on Advair does not complain of increased shortness of breath or increasing cough.  #3 history of peripheral vascular disease this is followed again by vascular physician as noted above-he does continue on aspirin as well as Plavix.  #4-history coronary artery disease-this appears to be stable he is on an Ace as well as anticoagulation with aspirin and Plavix continues on a statin as well -he is also on Imdur.  Lipid panel back in January 2017 showed an LDL of 36-will update lipid panel--goal is less than 70  #5-history of right shoulder pain-again this has been addressed by Dr. Linna Darner he is on Naprosyn twice a day after meals-he is also on oxycodone 15 mg every 4  hours as needed-an orthopedic consult is pending-patient still continues to complain of discomfort at times but we have been hesitant to increase his narcotic would like to give the Naprosyn more time here .  #6-history of lower extremity edema with history of ischemic cardiomyopathy again he is on Lasix 40 mg 3 times a day and 20 mg other days-this appears to be relatively stable weights have been difficult to obtain secondary to patient refusal but edema appears to be relatively baseline.  #7 renal insufficiency  creatinine most recently 1.47 appears to be Relatively Baseline As Well Will Update This As Well.  #8-history gout usually this occurs in his foot-but has been stable now for some time he is on colchicine once a day.  I'll #9-does have a history of gastric ulcer he is on a PPI this is been asymptomatic.  [id number] note greater than 40 minutes spent assessing patient-discussing his status with nursing staff-reviewing his chart-his labs-and coordinating and formulating a plan of care for numerous diagnoses-of note greater than 50% of time spent coordinating plan of care        Oralia Manis, [place]

## 2016-01-17 ENCOUNTER — Ambulatory Visit (INDEPENDENT_AMBULATORY_CARE_PROVIDER_SITE_OTHER): Payer: Medicaid Other | Admitting: Vascular Surgery

## 2016-01-17 ENCOUNTER — Encounter: Payer: Self-pay | Admitting: Vascular Surgery

## 2016-01-17 ENCOUNTER — Other Ambulatory Visit: Payer: Self-pay

## 2016-01-17 ENCOUNTER — Encounter (HOSPITAL_COMMUNITY)
Admission: RE | Admit: 2016-01-17 | Discharge: 2016-01-17 | Disposition: A | Discharge status: Home / Self Care | Payer: Medicaid Other | Source: Skilled Nursing Facility | Attending: Internal Medicine | Admitting: Internal Medicine

## 2016-01-17 VITALS — BP 128/75 | HR 62 | Temp 98.8°F | Resp 18 | Ht 68.5 in | Wt 212.0 lb

## 2016-01-17 DIAGNOSIS — Z89511 Acquired absence of right leg below knee: Secondary | ICD-10-CM | POA: Insufficient documentation

## 2016-01-17 DIAGNOSIS — I10 Essential (primary) hypertension: Secondary | ICD-10-CM | POA: Diagnosis present

## 2016-01-17 DIAGNOSIS — M10079 Idiopathic gout, unspecified ankle and foot: Secondary | ICD-10-CM | POA: Diagnosis present

## 2016-01-17 DIAGNOSIS — K259 Gastric ulcer, unspecified as acute or chronic, without hemorrhage or perforation: Secondary | ICD-10-CM | POA: Diagnosis present

## 2016-01-17 DIAGNOSIS — J449 Chronic obstructive pulmonary disease, unspecified: Secondary | ICD-10-CM | POA: Insufficient documentation

## 2016-01-17 DIAGNOSIS — I739 Peripheral vascular disease, unspecified: Secondary | ICD-10-CM

## 2016-01-17 LAB — COMPREHENSIVE METABOLIC PANEL
ALBUMIN: 4.1 g/dL (ref 3.5–5.0)
ALK PHOS: 62 U/L (ref 38–126)
ALT: 16 U/L — AB (ref 17–63)
AST: 21 U/L (ref 15–41)
Anion gap: 10 (ref 5–15)
BUN: 35 mg/dL — AB (ref 6–20)
CALCIUM: 9 mg/dL (ref 8.9–10.3)
CHLORIDE: 93 mmol/L — AB (ref 101–111)
CO2: 29 mmol/L (ref 22–32)
CREATININE: 1.79 mg/dL — AB (ref 0.61–1.24)
GFR calc non Af Amer: 40 mL/min — ABNORMAL LOW (ref >60)
GFR, EST AFRICAN AMERICAN: 46 mL/min — AB (ref >60)
GLUCOSE: 96 mg/dL (ref 65–99)
Potassium: 4 mmol/L (ref 3.5–5.1)
SODIUM: 132 mmol/L — AB (ref 135–145)
Total Bilirubin: 0.8 mg/dL (ref 0.3–1.2)
Total Protein: 7.5 g/dL (ref 6.5–8.1)

## 2016-01-17 LAB — CBC
HEMATOCRIT: 46.8 % (ref 39.0–52.0)
Hemoglobin: 15.6 g/dL (ref 13.0–17.0)
MCH: 29.5 pg (ref 26.0–34.0)
MCHC: 33.3 g/dL (ref 30.0–36.0)
MCV: 88.5 fL (ref 78.0–100.0)
PLATELETS: 234 10*3/uL (ref 150–400)
RBC: 5.29 MIL/uL (ref 4.22–5.81)
RDW: 14.6 % (ref 11.5–15.5)
WBC: 8.2 10*3/uL (ref 4.0–10.5)

## 2016-01-17 LAB — LIPID PANEL
Cholesterol: 127 mg/dL (ref 0–200)
HDL: 25 mg/dL — AB (ref >40)
LDL CALC: 67 mg/dL (ref 0–99)
Total CHOL/HDL Ratio: 5.1 RATIO
Triglycerides: 177 mg/dL — ABNORMAL HIGH (ref <150)
VLDL: 35 mg/dL (ref 0–40)
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
## 2016-01-18 ENCOUNTER — Other Ambulatory Visit: Payer: Self-pay | Admitting: *Deleted

## 2016-01-18 MED ORDER — OXYCODONE HCL 15 MG PO TABS: ORAL_TABLET | ORAL | Status: DC

## 2016-01-18 NOTE — Telephone Encounter (Signed)
[facility] 

## 2016-01-28 MED ORDER — SODIUM CHLORIDE 0.9 % IV SOLN
INTRAVENOUS | Status: DC
  Administered 2016-01-30: 09:00:00 via INTRAVENOUS

## 2016-01-28 MED ORDER — MUPIROCIN 2 % EX OINT
1.0000 "application " | TOPICAL_OINTMENT | Freq: Once | CUTANEOUS | Status: AC
  Administered 2016-01-30: 1 via TOPICAL
  Filled 2016-01-28: qty 22

## 2016-01-28 MED ORDER — DEXTROSE 5 % IV SOLN
1.5000 g | INTRAVENOUS | Status: AC
  Administered 2016-01-30: 1.5 g via INTRAVENOUS
  Filled 2016-01-28: qty 1.5
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
## 2016-01-30 ENCOUNTER — Inpatient Hospital Stay (HOSPITAL_COMMUNITY)
Admission: RE | Admit: 2016-01-30 | Discharge: 2016-01-31 | DRG: 272 | Disposition: A | Discharge status: Skilled Nursing Facility | Payer: Medicare Other | Source: Ambulatory Visit | Attending: Vascular Surgery | Admitting: Vascular Surgery

## 2016-01-30 ENCOUNTER — Encounter (HOSPITAL_COMMUNITY): Payer: Self-pay | Admitting: Anesthesiology

## 2016-01-30 ENCOUNTER — Encounter (HOSPITAL_COMMUNITY)
Admission: RE | Disposition: A | Discharge status: Skilled Nursing Facility | Payer: Self-pay | Source: Ambulatory Visit | Attending: Vascular Surgery

## 2016-01-30 ENCOUNTER — Inpatient Hospital Stay (HOSPITAL_COMMUNITY): Payer: Medicare Other | Admitting: Anesthesiology

## 2016-01-30 DIAGNOSIS — I2510 Atherosclerotic heart disease of native coronary artery without angina pectoris: Secondary | ICD-10-CM | POA: Diagnosis present

## 2016-01-30 DIAGNOSIS — B370 Candidal stomatitis: Secondary | ICD-10-CM | POA: Diagnosis not present

## 2016-01-30 DIAGNOSIS — D649 Anemia, unspecified: Secondary | ICD-10-CM | POA: Diagnosis not present

## 2016-01-30 DIAGNOSIS — I4891 Unspecified atrial fibrillation: Secondary | ICD-10-CM | POA: Diagnosis present

## 2016-01-30 DIAGNOSIS — Z955 Presence of coronary angioplasty implant and graft: Secondary | ICD-10-CM | POA: Diagnosis not present

## 2016-01-30 DIAGNOSIS — E785 Hyperlipidemia, unspecified: Secondary | ICD-10-CM | POA: Diagnosis present

## 2016-01-30 DIAGNOSIS — Z95828 Presence of other vascular implants and grafts: Secondary | ICD-10-CM | POA: Diagnosis not present

## 2016-01-30 DIAGNOSIS — I714 Abdominal aortic aneurysm, without rupture: Secondary | ICD-10-CM | POA: Diagnosis present

## 2016-01-30 DIAGNOSIS — I739 Peripheral vascular disease, unspecified: Secondary | ICD-10-CM | POA: Diagnosis present

## 2016-01-30 DIAGNOSIS — J449 Chronic obstructive pulmonary disease, unspecified: Secondary | ICD-10-CM | POA: Diagnosis present

## 2016-01-30 DIAGNOSIS — Z89611 Acquired absence of right leg above knee: Secondary | ICD-10-CM

## 2016-01-30 DIAGNOSIS — Z7902 Long term (current) use of antithrombotics/antiplatelets: Secondary | ICD-10-CM | POA: Diagnosis not present

## 2016-01-30 DIAGNOSIS — I70202 Unspecified atherosclerosis of native arteries of extremities, left leg: Secondary | ICD-10-CM | POA: Diagnosis present

## 2016-01-30 DIAGNOSIS — I10 Essential (primary) hypertension: Secondary | ICD-10-CM | POA: Diagnosis present

## 2016-01-30 DIAGNOSIS — F17210 Nicotine dependence, cigarettes, uncomplicated: Secondary | ICD-10-CM | POA: Diagnosis present

## 2016-01-30 DIAGNOSIS — I70249 Atherosclerosis of native arteries of left leg with ulceration of unspecified site: Secondary | ICD-10-CM | POA: Diagnosis not present

## 2016-01-30 DIAGNOSIS — Z720 Tobacco use: Secondary | ICD-10-CM | POA: Diagnosis not present

## 2016-01-30 DIAGNOSIS — I6523 Occlusion and stenosis of bilateral carotid arteries: Secondary | ICD-10-CM | POA: Diagnosis present

## 2016-01-30 DIAGNOSIS — Z885 Allergy status to narcotic agent status: Secondary | ICD-10-CM | POA: Diagnosis not present

## 2016-01-30 HISTORY — PX: PATCH ANGIOPLASTY: SHX6230

## 2016-01-30 HISTORY — PX: ENDARTERECTOMY FEMORAL: SHX5804

## 2016-01-30 LAB — COMPREHENSIVE METABOLIC PANEL
ALBUMIN: 3.4 g/dL — AB (ref 3.5–5.0)
ALK PHOS: 52 U/L (ref 38–126)
ALT: 12 U/L — ABNORMAL LOW (ref 17–63)
ANION GAP: 9 (ref 5–15)
AST: 18 U/L (ref 15–41)
BUN: 21 mg/dL — ABNORMAL HIGH (ref 6–20)
CO2: 29 mmol/L (ref 22–32)
Calcium: 9.5 mg/dL (ref 8.9–10.3)
Chloride: 99 mmol/L — ABNORMAL LOW (ref 101–111)
Creatinine, Ser: 1.63 mg/dL — ABNORMAL HIGH (ref 0.61–1.24)
GFR calc Af Amer: 52 mL/min — ABNORMAL LOW (ref >60)
GFR calc non Af Amer: 45 mL/min — ABNORMAL LOW (ref >60)
GLUCOSE: 110 mg/dL — AB (ref 65–99)
POTASSIUM: 4.2 mmol/L (ref 3.5–5.1)
SODIUM: 137 mmol/L (ref 135–145)
Total Bilirubin: 0.5 mg/dL (ref 0.3–1.2)
Total Protein: 6.1 g/dL — ABNORMAL LOW (ref 6.5–8.1)

## 2016-01-30 LAB — SURGICAL PCR SCREEN
MRSA, PCR: NEGATIVE
STAPHYLOCOCCUS AUREUS: NEGATIVE

## 2016-01-30 LAB — TYPE AND SCREEN
ABO/RH(D): A POS
Antibody Screen: NEGATIVE

## 2016-01-30 LAB — PROTIME-INR
INR: 1.05 (ref 0.00–1.49)
PROTHROMBIN TIME: 13.9 s (ref 11.6–15.2)

## 2016-01-30 LAB — CBC
HCT: 46.3 % (ref 39.0–52.0)
HEMOGLOBIN: 14.5 g/dL (ref 13.0–17.0)
MCH: 28.9 pg (ref 26.0–34.0)
MCHC: 31.3 g/dL (ref 30.0–36.0)
MCV: 92.4 fL (ref 78.0–100.0)
Platelets: 185 10*3/uL (ref 150–400)
RBC: 5.01 MIL/uL (ref 4.22–5.81)
RDW: 15.6 % — AB (ref 11.5–15.5)
WBC: 6.8 10*3/uL (ref 4.0–10.5)

## 2016-01-30 LAB — APTT: APTT: 31 s (ref 24–37)

## 2016-01-30 SURGERY — ENDARTERECTOMY, FEMORAL
Anesthesia: General | Site: Groin | Laterality: Left

## 2016-01-30 MED ORDER — SODIUM CHLORIDE 0.9 % IV SOLN
INTRAVENOUS | Status: DC
  Administered 2016-01-30: 14:00:00 via INTRAVENOUS

## 2016-01-30 MED ORDER — GUAIFENESIN-DM 100-10 MG/5ML PO SYRP: 15.0000 mL | ORAL_SOLUTION | ORAL | Status: DC | PRN

## 2016-01-30 MED ORDER — FUROSEMIDE 40 MG PO TABS
40.0000 mg | ORAL_TABLET | ORAL | Status: DC
Start: 2016-01-30 — End: 2016-01-31
  Administered 2016-01-30: 40 mg via ORAL
  Filled 2016-01-30: qty 1

## 2016-01-30 MED ORDER — PROTAMINE SULFATE 10 MG/ML IV SOLN
INTRAVENOUS | Status: DC | PRN
  Administered 2016-01-30 (×10): 10 mg via INTRAVENOUS

## 2016-01-30 MED ORDER — EPHEDRINE SULFATE 50 MG/ML IJ SOLN
INTRAMUSCULAR | Status: DC | PRN
  Administered 2016-01-30 (×2): 10 mg via INTRAVENOUS

## 2016-01-30 MED ORDER — HYDROMORPHONE HCL 1 MG/ML IJ SOLN
INTRAMUSCULAR | Status: AC
  Filled 2016-01-30: qty 1

## 2016-01-30 MED ORDER — PROTAMINE SULFATE 10 MG/ML IV SOLN
INTRAVENOUS | Status: AC
  Filled 2016-01-30: qty 5

## 2016-01-30 MED ORDER — ASPIRIN EC 81 MG PO TBEC
81.0000 mg | DELAYED_RELEASE_TABLET | Freq: Every day | ORAL | Status: DC
  Administered 2016-01-30 – 2016-01-31 (×2): 81 mg via ORAL
  Filled 2016-01-30 (×2): qty 1

## 2016-01-30 MED ORDER — LIDOCAINE 2% (20 MG/ML) 5 ML SYRINGE
INTRAMUSCULAR | Status: AC
  Filled 2016-01-30: qty 5

## 2016-01-30 MED ORDER — SUCCINYLCHOLINE CHLORIDE 20 MG/ML IJ SOLN
INTRAMUSCULAR | Status: DC | PRN
  Administered 2016-01-30: 110 mg via INTRAVENOUS

## 2016-01-30 MED ORDER — MIDAZOLAM HCL 2 MG/2ML IJ SOLN
INTRAMUSCULAR | Status: AC
  Filled 2016-01-30: qty 2

## 2016-01-30 MED ORDER — PROMETHAZINE HCL 25 MG/ML IJ SOLN: 6.2500 mg | INTRAMUSCULAR | Status: DC | PRN

## 2016-01-30 MED ORDER — DOCUSATE SODIUM 100 MG PO CAPS
100.0000 mg | ORAL_CAPSULE | Freq: Every day | ORAL | Status: DC
  Filled 2016-01-30: qty 1

## 2016-01-30 MED ORDER — ISOSORBIDE MONONITRATE ER 30 MG PO TB24
30.0000 mg | ORAL_TABLET | Freq: Every day | ORAL | Status: DC
  Administered 2016-01-30 – 2016-01-31 (×2): 30 mg via ORAL
  Filled 2016-01-30 (×2): qty 1

## 2016-01-30 MED ORDER — EPHEDRINE 5 MG/ML INJ
INTRAVENOUS | Status: AC
  Filled 2016-01-30: qty 10

## 2016-01-30 MED ORDER — POTASSIUM CHLORIDE CRYS ER 20 MEQ PO TBCR: 20.0000 meq | EXTENDED_RELEASE_TABLET | Freq: Every day | ORAL | Status: DC | PRN

## 2016-01-30 MED ORDER — SODIUM CHLORIDE 0.9 % IV SOLN
INTRAVENOUS | Status: DC | PRN
  Administered 2016-01-30: 08:00:00

## 2016-01-30 MED ORDER — TEMAZEPAM 15 MG PO CAPS
30.0000 mg | ORAL_CAPSULE | Freq: Every evening | ORAL | Status: DC | PRN
  Administered 2016-01-30: 30 mg via ORAL
  Filled 2016-01-30: qty 2

## 2016-01-30 MED ORDER — HYDRALAZINE HCL 20 MG/ML IJ SOLN: 5.0000 mg | INTRAMUSCULAR | Status: DC | PRN

## 2016-01-30 MED ORDER — ACETAMINOPHEN 325 MG PO TABS: 325.0000 mg | ORAL_TABLET | ORAL | Status: DC | PRN

## 2016-01-30 MED ORDER — 0.9 % SODIUM CHLORIDE (POUR BTL) OPTIME
TOPICAL | Status: DC | PRN
  Administered 2016-01-30: 1000 mL

## 2016-01-30 MED ORDER — CLONIDINE HCL 0.2 MG PO TABS
0.2000 mg | ORAL_TABLET | Freq: Two times a day (BID) | ORAL | Status: DC
  Administered 2016-01-30 – 2016-01-31 (×2): 0.2 mg via ORAL
  Filled 2016-01-30 (×2): qty 1

## 2016-01-30 MED ORDER — LABETALOL HCL 5 MG/ML IV SOLN: 10.0000 mg | INTRAVENOUS | Status: DC | PRN

## 2016-01-30 MED ORDER — PANTOPRAZOLE SODIUM 40 MG PO TBEC: 40.0000 mg | DELAYED_RELEASE_TABLET | Freq: Every day | ORAL | Status: DC

## 2016-01-30 MED ORDER — GLYCOPYRROLATE 0.2 MG/ML IJ SOLN
INTRAMUSCULAR | Status: DC | PRN
  Administered 2016-01-30: 0.6 mg via INTRAVENOUS

## 2016-01-30 MED ORDER — NEOSTIGMINE METHYLSULFATE 5 MG/5ML IV SOSY
PREFILLED_SYRINGE | INTRAVENOUS | Status: AC
  Filled 2016-01-30: qty 5

## 2016-01-30 MED ORDER — OXYCODONE HCL 5 MG PO TABS
15.0000 mg | ORAL_TABLET | ORAL | Status: DC | PRN
  Administered 2016-01-30 – 2016-01-31 (×5): 15 mg via ORAL
  Filled 2016-01-30 (×5): qty 3

## 2016-01-30 MED ORDER — COLCHICINE 0.6 MG PO TABS
0.6000 mg | ORAL_TABLET | Freq: Every day | ORAL | Status: DC
  Administered 2016-01-30 – 2016-01-31 (×2): 0.6 mg via ORAL
  Filled 2016-01-30 (×2): qty 1

## 2016-01-30 MED ORDER — GLYCOPYRROLATE 0.2 MG/ML IV SOSY
PREFILLED_SYRINGE | INTRAVENOUS | Status: AC
  Filled 2016-01-30: qty 3

## 2016-01-30 MED ORDER — SODIUM CHLORIDE 0.9 % IV SOLN: 500.0000 mL | Freq: Once | INTRAVENOUS | Status: DC | PRN

## 2016-01-30 MED ORDER — HEMOSTATIC AGENTS (NO CHARGE) OPTIME
TOPICAL | Status: DC | PRN
  Administered 2016-01-30: 1 via TOPICAL

## 2016-01-30 MED ORDER — CHLORHEXIDINE GLUCONATE CLOTH 2 % EX PADS: 6.0000 | MEDICATED_PAD | Freq: Once | CUTANEOUS | Status: DC

## 2016-01-30 MED ORDER — FENTANYL CITRATE (PF) 100 MCG/2ML IJ SOLN
INTRAMUSCULAR | Status: DC | PRN
  Administered 2016-01-30: 25 ug via INTRAVENOUS
  Administered 2016-01-30: 75 ug via INTRAVENOUS
  Administered 2016-01-30 (×2): 50 ug via INTRAVENOUS

## 2016-01-30 MED ORDER — LACTATED RINGERS IV SOLN
INTRAVENOUS | Status: DC | PRN
  Administered 2016-01-30 (×2): via INTRAVENOUS

## 2016-01-30 MED ORDER — PANTOPRAZOLE SODIUM 40 MG PO TBEC
40.0000 mg | DELAYED_RELEASE_TABLET | Freq: Every day | ORAL | Status: DC
  Administered 2016-01-30 – 2016-01-31 (×2): 40 mg via ORAL
  Filled 2016-01-30 (×2): qty 1

## 2016-01-30 MED ORDER — HEPARIN SODIUM (PORCINE) 1000 UNIT/ML IJ SOLN
INTRAMUSCULAR | Status: DC | PRN
  Administered 2016-01-30: 10000 [IU] via INTRAVENOUS

## 2016-01-30 MED ORDER — HEPARIN SODIUM (PORCINE) 1000 UNIT/ML IJ SOLN
INTRAMUSCULAR | Status: AC
  Filled 2016-01-30: qty 1

## 2016-01-30 MED ORDER — PROPOFOL 10 MG/ML IV BOLUS
INTRAVENOUS | Status: AC
  Filled 2016-01-30: qty 20

## 2016-01-30 MED ORDER — FUROSEMIDE 20 MG PO TABS
20.0000 mg | ORAL_TABLET | ORAL | Status: DC
  Administered 2016-01-31: 20 mg via ORAL
  Filled 2016-01-30 (×2): qty 1

## 2016-01-30 MED ORDER — ROCURONIUM BROMIDE 100 MG/10ML IV SOLN
INTRAVENOUS | Status: DC | PRN
  Administered 2016-01-30: 10 mg via INTRAVENOUS
  Administered 2016-01-30: 40 mg via INTRAVENOUS

## 2016-01-30 MED ORDER — ONDANSETRON HCL 4 MG/2ML IJ SOLN
INTRAMUSCULAR | Status: AC
  Filled 2016-01-30: qty 2

## 2016-01-30 MED ORDER — MORPHINE SULFATE (PF) 2 MG/ML IV SOLN
INTRAVENOUS | Status: AC
  Filled 2016-01-30: qty 1

## 2016-01-30 MED ORDER — SUCCINYLCHOLINE CHLORIDE 200 MG/10ML IV SOSY
PREFILLED_SYRINGE | INTRAVENOUS | Status: AC
  Filled 2016-01-30: qty 10

## 2016-01-30 MED ORDER — FUROSEMIDE 20 MG PO TABS: 20.0000 mg | ORAL_TABLET | Freq: Every day | ORAL | Status: DC

## 2016-01-30 MED ORDER — PROPOFOL 10 MG/ML IV BOLUS
INTRAVENOUS | Status: DC | PRN
  Administered 2016-01-30: 30 mg via INTRAVENOUS
  Administered 2016-01-30: 150 mg via INTRAVENOUS
  Administered 2016-01-30: 20 mg via INTRAVENOUS

## 2016-01-30 MED ORDER — NEOSTIGMINE METHYLSULFATE 10 MG/10ML IV SOLN
INTRAVENOUS | Status: DC | PRN
  Administered 2016-01-30: 4 mg via INTRAVENOUS

## 2016-01-30 MED ORDER — METOPROLOL TARTRATE 5 MG/5ML IV SOLN: 2.0000 mg | INTRAVENOUS | Status: DC | PRN

## 2016-01-30 MED ORDER — ONDANSETRON HCL 4 MG/2ML IJ SOLN
INTRAMUSCULAR | Status: DC | PRN
  Administered 2016-01-30: 4 mg via INTRAVENOUS

## 2016-01-30 MED ORDER — ROCURONIUM BROMIDE 50 MG/5ML IV SOLN
INTRAVENOUS | Status: AC
  Filled 2016-01-30: qty 1

## 2016-01-30 MED ORDER — DEXTROSE 5 % IV SOLN
1.5000 g | Freq: Two times a day (BID) | INTRAVENOUS | Status: AC
  Administered 2016-01-30 – 2016-01-31 (×2): 1.5 g via INTRAVENOUS
  Filled 2016-01-30 (×2): qty 1.5

## 2016-01-30 MED ORDER — ALUM & MAG HYDROXIDE-SIMETH 200-200-20 MG/5ML PO SUSP: 15.0000 mL | ORAL | Status: DC | PRN

## 2016-01-30 MED ORDER — MORPHINE SULFATE (PF) 2 MG/ML IV SOLN
2.0000 mg | INTRAVENOUS | Status: DC | PRN
  Administered 2016-01-30 (×2): 2 mg via INTRAVENOUS
  Filled 2016-01-30 (×2): qty 1

## 2016-01-30 MED ORDER — SENNOSIDES-DOCUSATE SODIUM 8.6-50 MG PO TABS: 1.0000 | ORAL_TABLET | Freq: Every evening | ORAL | Status: DC | PRN

## 2016-01-30 MED ORDER — ALBUTEROL SULFATE HFA 108 (90 BASE) MCG/ACT IN AERS
INHALATION_SPRAY | RESPIRATORY_TRACT | Status: AC
  Filled 2016-01-30: qty 6.7

## 2016-01-30 MED ORDER — PHENOL 1.4 % MT LIQD: 1.0000 | OROMUCOSAL | Status: DC | PRN

## 2016-01-30 MED ORDER — LIDOCAINE HCL (CARDIAC) 20 MG/ML IV SOLN
INTRAVENOUS | Status: DC | PRN
  Administered 2016-01-30: 100 mg via INTRAVENOUS

## 2016-01-30 MED ORDER — ALBUTEROL SULFATE HFA 108 (90 BASE) MCG/ACT IN AERS
INHALATION_SPRAY | RESPIRATORY_TRACT | Status: DC | PRN
  Administered 2016-01-30 (×2): 6 via RESPIRATORY_TRACT

## 2016-01-30 MED ORDER — METOPROLOL TARTRATE 25 MG PO TABS
25.0000 mg | ORAL_TABLET | Freq: Two times a day (BID) | ORAL | Status: DC
  Administered 2016-01-30 – 2016-01-31 (×2): 25 mg via ORAL
  Filled 2016-01-30 (×2): qty 1

## 2016-01-30 MED ORDER — FENTANYL CITRATE (PF) 250 MCG/5ML IJ SOLN
INTRAMUSCULAR | Status: AC
  Filled 2016-01-30: qty 5

## 2016-01-30 MED ORDER — ONDANSETRON HCL 4 MG/2ML IJ SOLN: 4.0000 mg | Freq: Four times a day (QID) | INTRAMUSCULAR | Status: DC | PRN

## 2016-01-30 MED ORDER — ALBUMIN HUMAN 5 % IV SOLN
INTRAVENOUS | Status: DC | PRN
  Administered 2016-01-30: 10:00:00 via INTRAVENOUS

## 2016-01-30 MED ORDER — SIMVASTATIN 40 MG PO TABS
40.0000 mg | ORAL_TABLET | Freq: Every day | ORAL | Status: DC
  Administered 2016-01-30: 40 mg via ORAL
  Filled 2016-01-30: qty 1

## 2016-01-30 MED ORDER — CLOPIDOGREL BISULFATE 75 MG PO TABS
75.0000 mg | ORAL_TABLET | Freq: Every day | ORAL | Status: DC
  Administered 2016-01-31: 75 mg via ORAL
  Filled 2016-01-30: qty 1

## 2016-01-30 MED ORDER — POTASSIUM CHLORIDE CRYS ER 20 MEQ PO TBCR
20.0000 meq | EXTENDED_RELEASE_TABLET | Freq: Every day | ORAL | Status: DC
  Administered 2016-01-31: 20 meq via ORAL
  Filled 2016-01-30: qty 1

## 2016-01-30 MED ORDER — BISACODYL 10 MG RE SUPP: 10.0000 mg | Freq: Every day | RECTAL | Status: DC | PRN

## 2016-01-30 MED ORDER — HYDROMORPHONE HCL 1 MG/ML IJ SOLN
0.2500 mg | INTRAMUSCULAR | Status: DC | PRN
  Administered 2016-01-30 (×2): 0.25 mg via INTRAVENOUS
  Administered 2016-01-30: 0.5 mg via INTRAVENOUS

## 2016-01-30 MED ORDER — MIDAZOLAM HCL 5 MG/5ML IJ SOLN
INTRAMUSCULAR | Status: DC | PRN
  Administered 2016-01-30: 1.5 mg via INTRAVENOUS
  Administered 2016-01-30: 0.5 mg via INTRAVENOUS

## 2016-01-30 MED ORDER — ACETAMINOPHEN 325 MG RE SUPP: 325.0000 mg | RECTAL | Status: DC | PRN

## 2016-01-30 MED ORDER — MOMETASONE FURO-FORMOTEROL FUM 200-5 MCG/ACT IN AERO
2.0000 | INHALATION_SPRAY | Freq: Two times a day (BID) | RESPIRATORY_TRACT | Status: DC
  Filled 2016-01-30 (×2): qty 8.8

## 2016-01-30 SURGICAL SUPPLY — 43 items
CANISTER SUCTION 2500CC (MISCELLANEOUS) ×2 IMPLANT
CANNULA VESSEL 3MM 2 BLNT TIP (CANNULA) ×4 IMPLANT
CLIP TI MEDIUM 24 (CLIP) ×2 IMPLANT
CLIP TI WIDE RED SMALL 24 (CLIP) ×2 IMPLANT
CNTNR SPEC C3OZ STD GRAD LEK (MISCELLANEOUS) ×1 IMPLANT
CONT SPEC 3OZ W/LID STRL (MISCELLANEOUS) ×1
DRAIN SNY WOU (WOUND CARE) IMPLANT
ELECT REM PT RETURN 9FT ADLT (ELECTROSURGICAL) ×2
ELECTRODE REM PT RTRN 9FT ADLT (ELECTROSURGICAL) ×1 IMPLANT
EVACUATOR SILICONE 100CC (DRAIN) IMPLANT
GAUZE SPONGE 4X4 16PLY XRAY LF (GAUZE/BANDAGES/DRESSINGS) ×2 IMPLANT
GLOVE BIO SURGEON STRL SZ 6.5 (GLOVE) ×4 IMPLANT
GLOVE BIO SURGEON STRL SZ7.5 (GLOVE) ×2 IMPLANT
GLOVE BIOGEL PI IND STRL 6.5 (GLOVE) ×2 IMPLANT
GLOVE BIOGEL PI IND STRL 7.0 (GLOVE) ×2 IMPLANT
GLOVE BIOGEL PI INDICATOR 6.5 (GLOVE) ×2
GLOVE BIOGEL PI INDICATOR 7.0 (GLOVE) ×2
GLOVE ECLIPSE 6.5 STRL STRAW (GLOVE) ×2 IMPLANT
GOWN STRL REUS W/ TWL LRG LVL3 (GOWN DISPOSABLE) ×3 IMPLANT
GOWN STRL REUS W/TWL LRG LVL3 (GOWN DISPOSABLE) ×3
HEMOSTAT SPONGE AVITENE ULTRA (HEMOSTASIS) ×2 IMPLANT
KIT BASIN OR (CUSTOM PROCEDURE TRAY) ×2 IMPLANT
KIT ROOM TURNOVER OR (KITS) ×2 IMPLANT
LIQUID BAND (GAUZE/BANDAGES/DRESSINGS) ×2 IMPLANT
LOOP VESSEL MINI RED (MISCELLANEOUS) ×2 IMPLANT
NS IRRIG 1000ML POUR BTL (IV SOLUTION) ×4 IMPLANT
PACK PERIPHERAL VASCULAR (CUSTOM PROCEDURE TRAY) ×2 IMPLANT
PAD ARMBOARD 7.5X6 YLW CONV (MISCELLANEOUS) ×4 IMPLANT
PATCH HEMASHIELD 8X75 (Vascular Products) ×2 IMPLANT
SPONGE INTESTINAL PEANUT (DISPOSABLE) ×2 IMPLANT
SPONGE SURGIFOAM ABS GEL 100 (HEMOSTASIS) IMPLANT
STAPLER VISISTAT 35W (STAPLE) IMPLANT
SUT ETHILON 3 0 PS 1 (SUTURE) IMPLANT
SUT PROLENE 5 0 C 1 24 (SUTURE) ×2 IMPLANT
SUT PROLENE 6 0 CC (SUTURE) ×2 IMPLANT
SUT VIC AB 2-0 SH 27 (SUTURE) ×1
SUT VIC AB 2-0 SH 27XBRD (SUTURE) ×1 IMPLANT
SUT VIC AB 3-0 SH 27 (SUTURE) ×3
SUT VIC AB 3-0 SH 27X BRD (SUTURE) ×3 IMPLANT
SUT VICRYL 4-0 PS2 18IN ABS (SUTURE) ×2 IMPLANT
TRAY FOLEY W/METER SILVER 16FR (SET/KITS/TRAYS/PACK) ×2 IMPLANT
UNDERPAD 30X30 INCONTINENT (UNDERPADS AND DIAPERS) ×2 IMPLANT
WATER STERILE IRR 1000ML POUR (IV SOLUTION) ×2 IMPLANT

## 2016-01-30 NOTE — Interval H&P Note (Signed)
History and Physical Interval Note:  01/30/2016 7:47 AM  Edwin [name]  has presented today for surgery, with the diagnosis of Peripheral vascular disease with left lower extremity ulcer I70.249  The various methods of treatment have been discussed with the patient and family. After consideration of risks, benefits and other options for treatment, the patient has consented to  Procedure(s): ENDARTERECTOMY FEMORAL (Left) as a surgical intervention .  The patient's history has been reviewed, patient examined, no change in status, stable for surgery.  I have reviewed the patient's chart and labs.  Questions were answered to the patient's satisfaction.     Ruta Hinds

## 2016-01-30 NOTE — Progress Notes (Signed)
  Day of Surgery Note    Subjective:  Awakes to command-no complaints  Filed Vitals:   01/30/16 1245 01/30/16 1300  BP: 134/68 134/71  Pulse: 56 59  Temp:    Resp: 13 13    Incisions:   Left groin incision is clean and dry without hematoma Extremities:  + monophasic doppler signals left DP/PT/peroneal signals Lungs:  Non labored.    Assessment/Plan:  This is a 60 y.o. male who is s/p  Left common femoral endarterectomy  -pt doing well with + monophasic doppler signals left DP/PT/peroneal signals -left groin incision clean and dry without hematoma -possibly back to SNF tomorrow-will order social work consult   Leontine Locket, PA-C 01/30/2016 1:29 PM
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
## 2016-01-30 NOTE — Progress Notes (Signed)
[place] did not give patient any medications this morning as they has been instructed.

## 2016-01-30 NOTE — Anesthesia Procedure Notes (Signed)
Procedure Name: Intubation Date/Time: 01/30/2016 8:39 AM Performed by: Terrill Mohr Pre-anesthesia Checklist: Emergency Drugs available, Patient identified, Suction available and Patient being monitored Patient Re-evaluated:Patient Re-evaluated prior to inductionOxygen Delivery Method: Circle system utilized Preoxygenation: Pre-oxygenation with 100% oxygen Intubation Type: IV induction Ventilation: Mask ventilation without difficulty Laryngoscope Size: Mac and 4 Grade View: Grade II Tube type: Oral Tube size: 7.5 mm Number of attempts: 1 Airway Equipment and Method: Stylet Placement Confirmation: ETT inserted through vocal cords under direct vision,  positive ETCO2 and breath sounds checked- equal and bilateral Secured at: 22 (cm at upper gum) cm Tube secured with: Tape Dental Injury: Teeth and Oropharynx as per pre-operative assessment

## 2016-01-30 NOTE — Op Note (Addendum)
Procedure: Left common femoral endarterectomy  Preoperative diagnosis: Nonhealing wound left leg postoperative diagnosis: Same  Anesthesia: Gen.  Assistant: Leontine Locket PA-C  Operative findings: #1 Severe calcific atherosclerotic plaque left common femoral artery #2 Dacron patch left common femoral artery extending 2 cm onto the superficial femoral artery  Operative details: After obtaining informed consent, the patient was taken to operating room. The patient was placed in supine position operating table. After induction of general anesthesia and endotracheal intubation, the patient's entire left lower extremity was prepped and draped in usual sterile fashion. A longitudinal incision was made in the left groin care down the saphenous tissues down level left common femoral artery. This was severely calcified. Dissection was carried down to level the femoral bifurcation. The superficial femoral artery was dissected free circumferentially as well as the profunda femoris artery. Both these had vessel loops placed around them. Dissection was then carried out the level of the inguinal ligament. The common femoral artery was still heavily calcified in this area. Therefore I dissected up underneath the inguinal ligament ligating several venous tributaries crossing branches with silk ties. I dissected out the circumflex iliac branches at the level of the inguinal ligament and then placed a vessel loop around the distal external iliac artery as well. The distal external iliac artery was soft and character and clamp. At this point patient was given 10,000 units of intravenous heparin. A longitudinal opening was made in the common femoral artery. There is a large amount of calcific plaque nearly occluding the common femoral artery. Found a suitable plane to begin an endarterectomy in the common femoral endarterectomy was carried proximally up into the distal external iliac artery. A plug of plaque was removed  creating a flush proximal endpoint. I then carried the endarterectomy distally down to the femoral bifurcation. A good endpoint was obtained in the proximal profunda. An eversion endarterectomy was performed in the superficial femoral artery and a good endpoint was also obtained. There was good backbleeding from the profunda and superficial femoral arteries. There was excellent arterial inflow. A Dacron patch was then brought up in the operative field and sewn on as a patch angioplasty after removing all loose debris from the artery. Just prior completion anastomosis everything was forebled backbled and thoroughly flushed. Anastomosis was secured clamps released there was pulsatile flow in the SFA and profunda immediately. The patient had dorsalis pedis Doppler flow consistent with his arteriogram. Hemostasis was obtained with protamine as well as Avitene. The groin was then closed in multiple layers of running 3-0 Vicryl suture followed by 4 0 Vicryl subcuticular stitch in the skin and Dermabond on the skin. Patient tolerated the procedure well and there were no complications.  The instrument sponge and needle count was correct at the end of the case. The patient was taken to recovery room in stable condition.  Ruta Hinds, MD [facility] [place] Office: [phone] Pager: [phone]

## 2016-01-30 NOTE — Anesthesia Postprocedure Evaluation (Signed)
Anesthesia Post Note  Patient: Edwin [name]  Procedure(s) Performed: Procedure(s) (LRB): ENDARTERECTOMY LEFT FEMORAL ARTERY (Left) LEFT FEMORAL ARTYERY PATCH ANGIOPLASTY USING HEMASHIELD PLATINUM FINESSE PATCH (Left)  Patient location during evaluation: PACU Anesthesia Type: General Level of consciousness: awake and alert Pain management: pain level controlled Vital Signs Assessment: post-procedure vital signs reviewed and stable Respiratory status: spontaneous breathing, nonlabored ventilation, respiratory function stable and patient connected to nasal cannula oxygen Cardiovascular status: blood pressure returned to baseline and stable Postop Assessment: no signs of nausea or vomiting Anesthetic complications: no    Last Vitals:  Filed Vitals:   01/30/16 1200 01/30/16 1215  BP: 138/72 134/71  Pulse: 57 58  Temp:    Resp: 15 13    Last Pain:  Filed Vitals:   01/30/16 1219  PainSc: Asleep                 Mallory Schaad,W. EDMOND

## 2016-01-30 NOTE — Transfer of Care (Signed)
Immediate Anesthesia Transfer of Care Note  Patient: Edwin [name]  Procedure(s) Performed: Procedure(s): ENDARTERECTOMY LEFT FEMORAL ARTERY (Left) LEFT FEMORAL ARTYERY PATCH ANGIOPLASTY USING HEMASHIELD PLATINUM FINESSE PATCH (Left)  Patient Location: PACU  Anesthesia Type:General  Level of Consciousness: awake, alert , oriented and patient cooperative  Airway & Oxygen Therapy: Patient Spontanous Breathing and Patient connected to face mask oxygen  Post-op Assessment: Report given to RN, Post -op Vital signs reviewed and stable and Patient moving all extremities  Post vital signs: Reviewed and stable  Last Vitals:  Filed Vitals:   01/30/16 0720  BP: 151/76  Pulse: 55  Temp: 36.9 C  Resp: 18    Last Pain:  Filed Vitals:   01/30/16 1121  PainSc: 8       Patients Stated Pain Goal: 2 ([id number])  Complications: No apparent anesthesia complications

## 2016-01-31 ENCOUNTER — Encounter (HOSPITAL_COMMUNITY): Payer: Self-pay | Admitting: Vascular Surgery

## 2016-01-31 ENCOUNTER — Encounter (HOSPITAL_COMMUNITY): Payer: Self-pay

## 2016-01-31 ENCOUNTER — Telehealth: Payer: Self-pay | Admitting: Vascular Surgery

## 2016-01-31 ENCOUNTER — Inpatient Hospital Stay
Admission: RE | Admit: 2016-01-31 | Discharge: 2018-04-01 | Disposition: A | Discharge status: Skilled Nursing Facility | Payer: Medicare Other | Source: Ambulatory Visit | Attending: Internal Medicine | Admitting: Internal Medicine

## 2016-01-31 ENCOUNTER — Non-Acute Institutional Stay (SKILLED_NURSING_FACILITY): Payer: Medicare Other | Admitting: Internal Medicine

## 2016-01-31 DIAGNOSIS — B370 Candidal stomatitis: Secondary | ICD-10-CM

## 2016-01-31 DIAGNOSIS — Z720 Tobacco use: Secondary | ICD-10-CM | POA: Diagnosis not present

## 2016-01-31 DIAGNOSIS — F17200 Nicotine dependence, unspecified, uncomplicated: Secondary | ICD-10-CM

## 2016-01-31 DIAGNOSIS — I739 Peripheral vascular disease, unspecified: Secondary | ICD-10-CM

## 2016-01-31 LAB — BASIC METABOLIC PANEL
ANION GAP: 6 (ref 5–15)
BUN: 13 mg/dL (ref 6–20)
CALCIUM: 8.8 mg/dL — AB (ref 8.9–10.3)
CO2: 32 mmol/L (ref 22–32)
CREATININE: 1.41 mg/dL — AB (ref 0.61–1.24)
Chloride: 103 mmol/L (ref 101–111)
GFR calc Af Amer: >60 mL/min (ref >60)
GFR, EST NON AFRICAN AMERICAN: 53 mL/min — AB (ref >60)
GLUCOSE: 117 mg/dL — AB (ref 65–99)
Potassium: 4.4 mmol/L (ref 3.5–5.1)
Sodium: 141 mmol/L (ref 135–145)

## 2016-01-31 LAB — CBC
HCT: 46.2 % (ref 39.0–52.0)
Hemoglobin: 13.9 g/dL (ref 13.0–17.0)
MCH: 28.3 pg (ref 26.0–34.0)
MCHC: 30.1 g/dL (ref 30.0–36.0)
MCV: 94.1 fL (ref 78.0–100.0)
PLATELETS: 181 10*3/uL (ref 150–400)
RBC: 4.91 MIL/uL (ref 4.22–5.81)
RDW: 15.6 % — AB (ref 11.5–15.5)
WBC: 7.7 10*3/uL (ref 4.0–10.5)

## 2016-01-31 NOTE — Progress Notes (Addendum)
  Progress Note    01/31/2016 7:15 AM 1 Day Post-Op  Subjective:  No complaints  Afebrile HR 60's-80's NSR 0000000 systolic 0000000 RA this morning (99% 3LO2NC earlier this morning)  Filed Vitals:   01/31/16 0337 01/31/16 0640  BP: 161/82   Pulse: 60 61  Temp: 98.3 F (36.8 C)   Resp: 10 8    Physical Exam: Cardiac:  regular Lungs:  Non labored Incisions:  Clean and dry without hematoma Extremities:  Monophasic DP/peroneal doppler signal left foot.   CBC    Component Value Date/Time   WBC 7.7 01/31/2016 0530   WBC 7.7 09/24/2015   RBC 4.91 01/31/2016 0530   RBC 2.25* 09/20/2013 0810   HGB 13.9 01/31/2016 0530   HCT 46.2 01/31/2016 0530   PLT 181 01/31/2016 0530   MCV 94.1 01/31/2016 0530   MCH 28.3 01/31/2016 0530   MCHC 30.1 01/31/2016 0530   RDW 15.6* 01/31/2016 0530   LYMPHSABS 2.1 09/24/2015 0300   MONOABS 0.6 09/24/2015 0300   EOSABS 0.2 09/24/2015 0300   BASOSABS 0.1 09/24/2015 0300    BMET    Component Value Date/Time   NA 141 01/31/2016 0530   NA 137 11/26/2015   K 4.4 01/31/2016 0530   CL 103 01/31/2016 0530   CO2 32 01/31/2016 0530   GLUCOSE 117* 01/31/2016 0530   BUN 13 01/31/2016 0530   BUN 29* 11/26/2015   CREATININE 1.41* 01/31/2016 0530   CREATININE 1.5* 11/26/2015   CALCIUM 8.8* 01/31/2016 0530   GFRNONAA 53* 01/31/2016 0530   GFRAA >60 01/31/2016 0530    INR    Component Value Date/Time   INR 1.05 01/30/2016 0739     Intake/Output Summary (Last 24 hours) at 01/31/16 0715 Last data filed at 01/31/16 0655  Gross per 24 hour  Intake 4291.25 ml  Output   4445 ml  Net -153.75 ml     Assessment:  60 y.o. [name] is s/p:  Left common femoral endarterectomy  1 Day Post-Op  Plan: -pt doing well this morning with monophasic doppler signals left DP/peroneal -incision looks good without hematoma -has not had IV pain meds since yesterday afternoon.  Receiving OxyIR 15mg  q4h.  Says it doesn't work as well as it used to -will  leave SCD off left leg for a while due to open areas on lower leg -creatinine improved this morning at 1.41 (down from 1.63). -foley out this morning-pt needs to void -will use heparin SQ for DVT prophylaxis. -plavix and aspirin to restart at 10am -discussed groin wound care with pt and expressed importance of this -possible discharge later today.   Leontine Locket, PA-C [facility] [phone] 01/31/2016 7:15 AM    Agree with above.  Incision clean. No hematoma. Pain controlled. D/c back to SNF.  Follow up 2 weeks  Ruta Hinds, MD [facility] [place] Office: [phone] Pager: [phone]

## 2016-01-31 NOTE — Progress Notes (Signed)
OT Cancellation Note  Patient Details Name: Edwin [name] MRN: [id number] DOB: [birth year]   Cancelled Treatment:    Reason Eval/Treat Not Completed: OT screened, no needs identified, will sign off - Pt from SNF with plan to discharge back to SNF today.  Will defer OT eval.   Darlina Rumpf [place], OTR/L [id number]  01/31/2016, 12:42 PM
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
## 2016-01-31 NOTE — Evaluation (Signed)
Physical Therapy Evaluation Patient Details Name: Edwin [name] MRN: [id number] DOB: September 05, 1955 Today's Date: 01/31/2016   History of Present Illness  60 y.o. male admitted to [facility] on 01/30/16 for PVD, L LE ulcer, s/p endarterectomy L femoral on 01/30/16.  Pt with significant PMHx of HTN, CAD, CHF, anemia, A-fib, PAD, COPD, and R AKA.    Clinical Impression  Pt was able to preform his usual WC transfer from maximally elevated bed.  He was most limited today in performance of ADLs.  I would recommend therapy f/u at SNF.  Will defer further therapy to SNF setting as he is due to d/c today.      Follow Up Recommendations SNF    Equipment Recommendations  None recommended by PT    Recommendations for Other Services   NA    Precautions / Restrictions Precautions Precautions: Fall Precaution Comments: due to painful left leg and AKA on right Restrictions Weight Bearing Restrictions: No      Mobility  Bed Mobility Overal bed mobility: Modified Independent                Transfers Overall transfer level: Needs assistance Equipment used: None Transfers: Sit to/from Stand;Stand Pivot Transfers Sit to Stand: From elevated surface;Supervision Stand pivot transfers: Supervision;From elevated surface       General transfer comment: close supervision due to painful to weight bear through his left surgical leg.  Pt requested maximally elevated bed.  It would be interesting to see how he will tolerate getting out of lower WC.  Ambulation/Gait             General Gait Details: non, ambulatory PTA           Wheelchair Mobility Wheelchair Mobility Wheelchair mobility: Yes Wheelchair propulsion: Both upper extremities;Left lower extremity Wheelchair parts: Independent Distance: 300 Wheelchair Assistance Details (indicate cue type and reason): mod I managing chair on level surface in crowded hallway.         Balance Overall balance assessment: Needs  assistance Sitting-balance support: Feet supported;No upper extremity supported;Single extremity supported Sitting balance-Leahy Scale: Good     Standing balance support: Bilateral upper extremity supported Standing balance-Leahy Scale: Fair                               Pertinent Vitals/Pain Pain Assessment: Faces Pain Score: 8  Pain Location: left groin, left shoulder Pain Descriptors / Indicators: Grimacing;Guarding Pain Intervention(s): Limited activity within patient's tolerance;Monitored during session;Repositioned    Home Living Family/patient expects to be discharged to:: Skilled nursing facility (from [facility])                      Prior Function Level of Independence: Independent with assistive device(s)         Comments: transfers into and out of WC independently.  Has prosthetic leg, but doesn't use it.         Extremity/Trunk Assessment   Upper Extremity Assessment: Defer to OT evaluation           Lower Extremity Assessment: RLE deficits/detail;LLE deficits/detail RLE Deficits / Details: right leg AKA, can move against gravity with good ROM LLE Deficits / Details: ill appearing left lower leg wtih fragile skin, discoloration, and wounds.  several missing toes.  Pt with decreased ability to reach foot due to left groin pain with flexion, was functionally able to support his weight with bil upper extremities supported.  Painful to WB.  Cervical / Trunk Assessment: Normal  Communication   Communication: No difficulties  Cognition Arousal/Alertness: Awake/alert Behavior During Therapy: WFL for tasks assessed/performed Overall Cognitive Status: Within Functional Limits for tasks assessed                      General Comments General comments (skin integrity, edema, etc.): Pt struggled with ADLs he seems to normally preform without assistance (donning underwear, pants and socks/shoes) due to painful hip flexion on left side.             Assessment/Plan    PT Assessment All further PT needs can be met in the next venue of care (at [facility])  PT Diagnosis Difficulty walking;Abnormality of gait;Generalized weakness;Acute pain   PT Problem List Decreased strength;Decreased range of motion;Decreased activity tolerance;Decreased balance;Decreased mobility;Pain;Impaired sensation     PT Goals (Current goals can be found in the Care Plan section) Acute Rehab PT Goals Patient Stated Goal: to go home today PT Goal Formulation: All assessment and education complete, DC therapy               End of Session   Activity Tolerance: Patient limited by pain Patient left: in chair;with call bell/phone within reach Nurse Communication: Mobility status         Time: 1200-1224 PT Time Calculation (min) (ACUTE ONLY): 24 min   Charges:   PT Evaluation $PT Eval Moderate Complexity: 1 Procedure PT Treatments $Therapeutic Activity: 8-22 mins        Tamsyn Owusu B. Darrington, [place], DPT [phone]   01/31/2016, 2:06 PM

## 2016-01-31 NOTE — Telephone Encounter (Signed)
-----   Message from Mena Goes, RN sent at 01/31/2016  9:04 AM EDT ----- Regarding: 2 weeks    ----- Message -----    From: Gabriel Earing, PA-C    Sent: 01/31/2016   7:25 AM      To: Vvs Charge Pool  S/p left femoral endarterectomy 01/30/16.  F/u with Dr. Oneida Alar in 2 weeks.  Most likely will need ABI's if not done in the hospital before discharge.  Thanks, Aldona Bar

## 2016-01-31 NOTE — Progress Notes (Signed)
  This is a nursing facility follow up for  [facility] readmission within 30 days.  Interim medical record and care since last [place] visit was updated with review of diagnostic studies and change in clinical status since last visit were documented.   HPI: The patient was admitted 01/30/16 for femoral artery endarterectomy and patch angioplasty by Dr Oneida Alar. He returned to [facility] today. As expected he describes pain at the site of the surgery. He also describes a scratchy throat which he relates to the endotracheal tube placement. He denied the throat symptoms prior to surgery.  He continues to have pain in the left shoulder. He has an orthopedic consultation scheduled within the next 10 days. I attempted to find the patient for readmission; he was outside the facility smoking.  Comprehensive review of systems:Constitutional: No fever, chills, sweats  Cardiovascular: No chest pain, palpitations,paroxysmal nocturnal dyspnea, claudication, edema  Respiratory: No cough, sputum production,hemoptysis, DOE , significant snoring,apnea   Gastrointestinal: No heartburn,dysphagia,abdominal pain, nausea / vomiting,rectal bleeding, melena,change in bowels Musculoskeletal: No joint swelling Dermatologic: No rash, pruritus, change in appearance of skin   Physical exam:  Pertinent or positive findings: he has pattern alopecia and a full mustache and beard. Ptosis is more prominent on the left. The maxilla is edentulous. He has a few remaining mandibular teeth which are in poor repair. Patchy white exudate of the posterior pharynx suggesting Candida. He has harsh rhonchi in all lung fields. He has above-the-knee amputation of the right. The operative scar in the left inguinal area reveals no evidence of erythema or purulence. He has a lesion over the left mid shin which is dressed. Pedal pulses are decreased in the left foot General appearance:Adequately nourished; no  acute distress , increased work of breathing is present.   Lymphatic: No lymphadenopathy about the head, neck, axilla . Eyes: No conjunctival inflammation or lid edema is present. There is no scleral icterus. Ears:  External ear exam shows no significant lesions or deformities.   Nose:  External nasal examination shows no deformity or inflammation. Nasal mucosa are pink and moist without lesions ,exudates Neck:  No thyromegaly, masses, tenderness noted.    Heart:  Normal rate and regular rhythm. S1 and S2 normal without gallop, murmur, click, rub. His  heart sounds are somewhat obscured by the rhonchi. Abdomen:Bowel sounds are normal. Abdomen is soft and nontender with no organomegaly, hernias,masses. GU: deferred  Extremities:  No cyanosis, clubbing,edema  Neurologic exam : Strength equal  in bilat upper & L lower extremities Balance,Rhomberg,finger to nose testing could not be completed due to clinical state Skin: Warm & dry w/o tenting. No significant lesions or rash.    #1 peripheral vascular disease; status post femoral endarterectomy and patch angioplasty;no evidence of postoperative complications. #2 pharyngitis; clinically Candida suspect. His A1c has been prediabetic. 3 tobacco addiction despite recurrent discussions of its associated risks especially in reference to vascular disease See orders

## 2016-01-31 NOTE — Clinical Social Work Note (Signed)
CSW got approval for Surveyor, quantity of social work to use [facility] transportation as long as patient can pay privately. CSW spoke with patient and he said that [facility] has taken care of it in the past. CSW called [facility] and they said that [facility] usually faxes them information to pick patient up but will find out and contact CSW if anything is different.  Edwin [name], [place]

## 2016-01-31 NOTE — Clinical Social Work Note (Signed)
CSW facilitated patient discharge including contacting facility to confirm patient discharge plans. CSW offered to call his daughter but patient declined. Clinical information faxed to facility and patient agreeable with plan. CSW arranged ambulance transport via [facility] to [facility]. RN to call report prior to discharge [phone]).  CSW will sign off for now as social work intervention is no longer needed. Please consult [place] again if new needs arise.  Dayton Scrape, [place]

## 2016-01-31 NOTE — Patient Instructions (Signed)
Chlortrimazole  10 mg troches dissolved in the throat 5 times a day for can deal pharyngitis

## 2016-01-31 NOTE — Progress Notes (Signed)
Patient to discharge back to [facility] report given to receiving nurse Karalee Height, all questions answered at this time.  Pt. VSS with no s/s of distress noted.  Patient stable at discharge.  Awaiting transport.

## 2016-01-31 NOTE — Telephone Encounter (Signed)
Sched appt 7/26 at 12:45. Spoke to nurse at rehab center.

## 2016-01-31 NOTE — NC FL2 (Signed)
[place] MEDICAID FL2 LEVEL OF CARE SCREENING TOOL     IDENTIFICATION  Patient Name: Edwin [name] Birthdate: 26-Jun-1956 Sex: male Admission Date (Current Location): 01/30/2016  [facility] and [place] Number:  [employer] and Address:  [facility] [place] [address], [place], [place] [address]      Provider Number: [id number]  Attending Physician Name and Address:  Elam Dutch, MD  Relative Name and Phone Number:       Current Level of Care: Hospital Recommended Level of Care: [place] Prior Approval Number:    Date Approved/Denied:   PASRR Number: [id number]  Discharge Plan: SNF    Current Diagnoses: Patient Active Problem List   Diagnosis Date Noted  . PAD (peripheral artery disease) ([place]) 01/30/2016  . Blisters of multiple sites 12/05/2015  . Vasculopathy 11/22/2015  . CAD S/P percutaneous coronary angioplasty 09/03/2015  . Abnormal nuclear stress test 09/03/2015    Class: Diagnosis of  . Carotid stenosis 07/19/2015  . S/P insertion of iliac artery stent 07/19/2015  . Current smoker 07/19/2015  . Essential hypertension 07/23/2014  . Wound, open, toe 07/09/2014  . H/O superficial parotidectomy 03/29/2014  . Occlusion and stenosis of carotid artery without mention of cerebral infarction 02/16/2014  . Parotid mass 01/10/2014  . CHF (congestive heart failure) ([place]) 11/24/2013  . Edema 11/24/2013  . Gout 10/26/2013  . Anemia 10/11/2013  . Hyponatremia 10/11/2013  . GI bleeding 10/08/2013  . Acute respiratory failure ([place]) 09/09/2013  . Protein-calorie malnutrition, severe ([place]) 09/09/2013  . COPD (chronic obstructive pulmonary disease) ([place]) 09/08/2013  . Hyperkalemia 09/08/2013  . Decubitus ulcer of sacral region, unstageable ([place]) 09/08/2013  . Septic shock ([place]) 09/07/2013  . Chronic pain 08/22/2013  . Suicide attempt ([place]) 08/22/2013  . Asthma   . Tobacco use 03/27/2013  . Left carotid bruit  02/15/2013  . Coronary artery disease   . Anxiety state, unspecified 01/31/2013  . Insomnia 01/31/2013  . Hyperlipidemia with target LDL less than 70 11/27/2007  . ERECTILE DYSFUNCTION 11/27/2007  . ALCOHOLISM 11/27/2007    Orientation RESPIRATION BLADDER Height & Weight     Self, Time, Situation, Place  Other (Comment), O2 (Airway 7.5 mm cuffed. Nasal canula 3 L.) Continent Weight: 223 lb 8.7 oz (101.4 kg) Height:  5\' 8"  (172.7 cm)  BEHAVIORAL SYMPTOMS/MOOD NEUROLOGICAL BOWEL NUTRITION STATUS   (None)  (None) Continent Diet (Heart healthy)  AMBULATORY STATUS COMMUNICATION OF NEEDS Skin   Total Care Verbally Surgical wounds (Left groin)                       Personal Care Assistance Level of Assistance  Bathing, Feeding, Dressing Bathing Assistance: Limited assistance Feeding assistance: Independent Dressing Assistance: Limited assistance     Functional Limitations Info  Sight, Hearing, Speech Sight Info: Adequate Hearing Info: Adequate Speech Info: Adequate    SPECIAL CARE FACTORS FREQUENCY  Blood pressure                    Contractures Contractures Info: Not present    Additional Factors Info  Code Status, Allergies, Psychotropic Code Status Info: Full Allergies Info: Codeine Psychotropic Info: Alcohol use, anxiety, history of suicide attempt         Current Medications (01/31/2016):  This is the current hospital active medication list Current Facility-Administered Medications  Medication Dose Route Frequency Provider Last Rate Last Dose  . 0.9 %  sodium chloride infusion  500  mL Intravenous Once PRN Kourosh Jablonsky J Kore Madlock, PA-C      . 0.9 %  sodium chloride infusion   Intravenous Continuous Gabriel Earing, PA-C   Stopped at 01/31/16 0630  . acetaminophen (TYLENOL) tablet 325-650 mg  325-650 mg Oral Q4H PRN Hulen Shouts Antinio Sanderfer, PA-C       Or  . acetaminophen (TYLENOL) suppository 325-650 mg  325-650 mg Rectal Q4H PRN Keithan Dileonardo J Sedric Guia, PA-C      . alum &  mag hydroxide-simeth (MAALOX/MYLANTA) 200-200-20 MG/5ML suspension 15-30 mL  15-30 mL Oral Q2H PRN Wisdom Rickey J Ludie Hudon, PA-C      . aspirin EC tablet 81 mg  81 mg Oral Daily Carrissa Taitano J Dawit Tankard, PA-C   81 mg at 01/31/16 1015  . bisacodyl (DULCOLAX) suppository 10 mg  10 mg Rectal Daily PRN Hulen Shouts Chaselyn Nanney, PA-C      . cloNIDine (CATAPRES) tablet 0.2 mg  0.2 mg Oral BID Hulen Shouts Gurleen Larrivee, PA-C   0.2 mg at 01/31/16 1015  . clopidogrel (PLAVIX) tablet 75 mg  75 mg Oral Daily Jasean Ambrosia J Brady Plant, PA-C   75 mg at 01/31/16 1015  . colchicine tablet 0.6 mg  0.6 mg Oral Daily Hulen Shouts Collins Dimaria, PA-C   0.6 mg at 01/31/16 1015  . docusate sodium (COLACE) capsule 100 mg  100 mg Oral Daily Johnmatthew Solorio J Jari Carollo, PA-C   100 mg at 01/31/16 1000  . furosemide (LASIX) tablet 20 mg  20 mg Oral Once per day on Sun Tue Thu Sat Elam Dutch, MD      . furosemide (LASIX) tablet 40 mg  40 mg Oral Once per day on Mon Wed Fri Hulen Shouts Samad Thon, PA-C   40 mg at 01/30/16 1648  . guaiFENesin-dextromethorphan (ROBITUSSIN DM) 100-10 MG/5ML syrup 15 mL  15 mL Oral Q4H PRN Serjio Deupree J Joydan Gretzinger, PA-C      . hydrALAZINE (APRESOLINE) injection 5 mg  5 mg Intravenous Q20 Min PRN Larell Baney J Jeaneane Adamec, PA-C      . isosorbide mononitrate (IMDUR) 24 hr tablet 30 mg  30 mg Oral Daily Anjeli Casad J Amonie Wisser, PA-C   30 mg at 01/31/16 1015  . labetalol (NORMODYNE,TRANDATE) injection 10 mg  10 mg Intravenous Q10 min PRN Yuri Flener J Walt Geathers, PA-C      . metoprolol (LOPRESSOR) injection 2-5 mg  2-5 mg Intravenous Q2H PRN Stevie Ertle J Kwame Ryland, PA-C      . metoprolol tartrate (LOPRESSOR) tablet 25 mg  25 mg Oral BID Khalifa Knecht J Janiya Millirons, PA-C   25 mg at 01/31/16 1016  . mometasone-formoterol (DULERA) 200-5 MCG/ACT inhaler 2 puff  2 puff Inhalation BID Hulen Shouts Braedon Sjogren, PA-C   2 puff at 01/30/16 2209  . morphine 2 MG/ML injection 2-3 mg  2-3 mg Intravenous Q2H PRN Hulen Shouts Maille Halliwell, PA-C   2 mg at 01/30/16 1647  . ondansetron (ZOFRAN) injection 4 mg  4 mg Intravenous Q6H PRN Jahmani Staup  J Link Burgeson, PA-C      . oxyCODONE (Oxy IR/ROXICODONE) immediate release tablet 15 mg  15 mg Oral Q4H PRN Hulen Shouts Jewel Mcafee, PA-C   15 mg at 01/31/16 [id number]  . pantoprazole (PROTONIX) EC tablet 40 mg  40 mg Oral Daily Christle Nolting J Jetaime Pinnix, PA-C   40 mg at 01/31/16 1015  . phenol (CHLORASEPTIC) mouth spray 1 spray  1 spray Mouth/Throat PRN Rockney Grenz J Cory Rama, PA-C      . potassium chloride SA (K-DUR,KLOR-CON) CR tablet 20 mEq  20 mEq Oral Daily Gabriel Earing, PA-C  20 mEq at 01/31/16 1016  . potassium chloride SA (K-DUR,KLOR-CON) CR tablet 20-40 mEq  20-40 mEq Oral Daily PRN Destani Wamser J Tyreak Reagle, PA-C      . senna-docusate (Senokot-S) tablet 1 tablet  1 tablet Oral QHS PRN Gregory Dowe J Staisha Winiarski, PA-C      . simvastatin (ZOCOR) tablet 40 mg  40 mg Oral QHS Hulen Shouts Kirstyn Lean, PA-C   40 mg at 01/30/16 2119  . temazepam (RESTORIL) capsule 30 mg  30 mg Oral QHS PRN Hulen Shouts Kamyah Wilhelmsen, PA-C   30 mg at 01/30/16 2223     Discharge Medications: Please see discharge summary for a list of discharge medications.  Relevant Imaging Results:  Relevant Lab Results:   Additional Information SS#: [id number]  Candie Chroman, LCSW

## 2016-01-31 NOTE — Clinical Social Work Note (Signed)
Clinical Social Work Assessment  Patient Details  Name: Edwin [name] MRN: [id number] Date of Birth: 03/07/1956  Date of referral:  01/30/16               Reason for consult:  Discharge Planning                Permission sought to share information with:  Facility Sport and exercise psychologist, Family Supports Permission granted to share information::  Yes, Verbal Permission Granted  Name::     Melina Copa  Agency::  [facility]  Relationship::  Daughter  Contact Information:  [phone]  Housing/Transportation Living arrangements for the past 2 months:  [place] of Information:  Patient, Medical Team Patient Interpreter Needed:  None Criminal Activity/Legal Involvement Pertinent to Current Situation/Hospitalization:  No - Comment as needed Significant Relationships:  Adult Children Lives with:  Facility Resident Do you feel safe going back to the place where you live?  Yes Need for family participation in patient care:  Yes (Comment)  Care giving concerns:  Patient is from [facility] and plan is to return.  Social Worker assessment / plan:  CSW met with patient. No supports at bedside. CSW introduced role and explained that discharge planning would be discussed. CSW confirmed that patient is from [facility] and plans to return. Patient will likely discharge later today. SNF social workers aware and stated that patient can return anytime today. Patient will need transport that will also take his wheelchair. No further concerns. CSW encouraged patient to contact CSW as needed. CSW will follow patient for support and facilitate discharge back to SNF later this afternoon.  Employment status:  Unemployed Forensic scientist:  Medicaid In [place] PT Recommendations:  Not assessed at this time Information / Referral to community resources:  Other (Comment Required) (None. Plan is to return to [facility].)  Patient/Family's Response  to care:  Patient agreeable to return to SNF. Patient's daughter supportive and involved in patient's care. Patient appreciated social work intervention.  Patient/Family's Understanding of and Emotional Response to Diagnosis, Current Treatment, and Prognosis:  Patient aware of need for return to SNF once discharged. Patient appears happy with treatment at hospital.  Emotional Assessment Appearance:  Appears stated age Attitude/Demeanor/Rapport:   (Pleasant) Affect (typically observed):  Accepting, Appropriate, Calm, Pleasant Orientation:  Oriented to Self, Oriented to Place, Oriented to  Time, Oriented to Situation Alcohol / Substance use:  Tobacco Use, Alcohol Use Psych involvement (Current and /or in the community):  No (Comment)  Discharge Needs  Concerns to be addressed:  Care Coordination Readmission within the last 30 days:  No Current discharge risk:  Dependent with Mobility Barriers to Discharge:  No Barriers Identified   Candie Chroman, LCSW 01/31/2016, 10:33 AM

## 2016-02-12 ENCOUNTER — Telehealth: Payer: Self-pay | Admitting: Orthopaedic Surgery

## 2016-02-12 ENCOUNTER — Ambulatory Visit: Payer: Self-pay | Admitting: Orthopaedic Surgery

## 2016-02-12 NOTE — Telephone Encounter (Signed)
Patient needs to have Xray orders entered for upcoming (re-scheduled) appointment with Dr Luna Glasgow, 02/20/16, for right shoulder pain, per phone with Mavis at [facility], ph# [phone]. Notes are in [place] regarding the shoulder problem.

## 2016-02-13 ENCOUNTER — Other Ambulatory Visit: Payer: Self-pay | Admitting: *Deleted

## 2016-02-13 MED ORDER — OXYCODONE HCL 15 MG PO TABS: ORAL_TABLET | ORAL | Status: DC

## 2016-02-13 NOTE — Telephone Encounter (Signed)
[facility]  

## 2016-02-14 ENCOUNTER — Encounter: Payer: Self-pay | Admitting: Vascular Surgery

## 2016-02-19 ENCOUNTER — Other Ambulatory Visit: Payer: Self-pay | Admitting: Radiology

## 2016-02-19 DIAGNOSIS — M25511 Pain in right shoulder: Secondary | ICD-10-CM

## 2016-02-20 ENCOUNTER — Encounter: Payer: Self-pay | Admitting: Vascular Surgery

## 2016-02-20 ENCOUNTER — Ambulatory Visit: Payer: Self-pay | Admitting: Orthopaedic Surgery

## 2016-02-20 ENCOUNTER — Ambulatory Visit (INDEPENDENT_AMBULATORY_CARE_PROVIDER_SITE_OTHER): Payer: Medicare Other | Admitting: Vascular Surgery

## 2016-02-20 VITALS — BP 131/78 | HR 60 | Temp 98.0°F | Resp 18 | Ht 68.0 in | Wt 223.0 lb

## 2016-02-20 DIAGNOSIS — I739 Peripheral vascular disease, unspecified: Secondary | ICD-10-CM
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
## 2016-02-22 ENCOUNTER — Ambulatory Visit (HOSPITAL_COMMUNITY)
Admission: RE | Admit: 2016-02-22 | Discharge: 2016-02-22 | Disposition: A | Discharge status: Home / Self Care | Payer: Medicare Other | Source: Ambulatory Visit | Attending: Orthopedic Surgery | Admitting: Orthopedic Surgery

## 2016-02-22 DIAGNOSIS — M25511 Pain in right shoulder: Secondary | ICD-10-CM

## 2016-02-22 DIAGNOSIS — M19011 Primary osteoarthritis, right shoulder: Secondary | ICD-10-CM | POA: Insufficient documentation

## 2016-02-26 ENCOUNTER — Non-Acute Institutional Stay (SKILLED_NURSING_FACILITY): Payer: Medicare Other | Admitting: Internal Medicine

## 2016-02-26 ENCOUNTER — Encounter: Payer: Self-pay | Admitting: Internal Medicine

## 2016-02-26 ENCOUNTER — Ambulatory Visit (INDEPENDENT_AMBULATORY_CARE_PROVIDER_SITE_OTHER): Payer: Medicare Other | Admitting: Orthopaedic Surgery

## 2016-02-26 ENCOUNTER — Encounter: Payer: Self-pay | Admitting: Orthopaedic Surgery

## 2016-02-26 VITALS — BP 136/84 | HR 61 | Temp 97.0°F

## 2016-02-26 DIAGNOSIS — I999 Unspecified disorder of circulatory system: Secondary | ICD-10-CM

## 2016-02-26 DIAGNOSIS — Z720 Tobacco use: Secondary | ICD-10-CM

## 2016-02-26 DIAGNOSIS — F17210 Nicotine dependence, cigarettes, uncomplicated: Secondary | ICD-10-CM | POA: Diagnosis not present

## 2016-02-26 DIAGNOSIS — M19011 Primary osteoarthritis, right shoulder: Secondary | ICD-10-CM

## 2016-02-26 DIAGNOSIS — F17200 Nicotine dependence, unspecified, uncomplicated: Secondary | ICD-10-CM

## 2016-02-26 DIAGNOSIS — N289 Disorder of kidney and ureter, unspecified: Secondary | ICD-10-CM | POA: Insufficient documentation

## 2016-02-26 DIAGNOSIS — Z89611 Acquired absence of right leg above knee: Secondary | ICD-10-CM

## 2016-02-26 DIAGNOSIS — I10 Essential (primary) hypertension: Secondary | ICD-10-CM

## 2016-02-26 DIAGNOSIS — J42 Unspecified chronic bronchitis: Secondary | ICD-10-CM

## 2016-02-26 DIAGNOSIS — I255 Ischemic cardiomyopathy: Secondary | ICD-10-CM | POA: Diagnosis not present

## 2016-02-26 NOTE — Progress Notes (Signed)
Facility Location: [place] Room Number: 107-W   This is a nursing facility follow up of chronic medical diagnoses  Interim medical record and care since last [place] visit was updated with review of diagnostic studies and change in clinical status since last visit were documented.  Note: only family and social history pertinent to this assessment are included.  HPI: He has significant vasculopathy and has had a R above-the-knee amputation as well as recent endarterectomy. He also has obstructive lung disease in the context of ongoing smoking at half pack per day. Dr. Oneida Alar saw him in postop follow-up 02/20/16 he was felt to be improved following endarterectomy. The patient states that the skin lesions on the left leg have improved as has the edema since the surgical procedure. Follow-up in 3 months is recommended. He also was seen by Dr. Luna Glasgow today for osteoarthritis of the right shoulder. This was felt to be end-stage and surgery was recommended but declined. No changes made in his therapy and follow-up was to be as needed for worsening symptoms. Blood pressures adequately controlled. He does have renal insufficiency which varies from 1.41-1.79. GFR varies from 40-53. He has had mild hyperglycemia but his A1c's have been prediabetic.  Comprehensive review of systems is negative except for easy bruising on the Plavix.he DENIES any cardiopulmonary symptoms. Constitutional: No fever,significant weight change, fatigue  Eyes: No redness, discharge, pain, vision change ENT/mouth: No nasal congestion,  purulent discharge, earache,change in hearing ,sore throat  Cardiovascular: No chest pain, palpitations,paroxysmal nocturnal dyspnea, claudication, edema  Respiratory: No cough, sputum production,hemoptysis, DOE , significant snoring,apnea  Gastrointestinal: No heartburn,dysphagia,abdominal pain, nausea / vomiting,rectal bleeding, melena,change in  bowels Genitourinary: No dysuria,hematuria, pyuria,  incontinence, nocturia Musculoskeletal: No joint stiffness, joint swelling, weakness,pain Dermatologic: No rash, pruritus, change in appearance of skin Neurologic: No dizziness,headache,syncope, seizures, numbness , tingling Psychiatric: No significant anxiety , depression, insomnia, anorexia Endocrine: No change in hair/skin/ nails, excessive thirst, excessive hunger, excessive urination  Hematologic/lymphatic: No lymphadenopathy Allergy/immunology: No itchy/ watery eyes, significant sneezing, urticaria, angioedema  Physical exam:  Pertinent or positive findings: Pattern alopecia present. He has a full mustache and beard. Dental hygiene is poor. Operative scar left neck well-healed. NO carotid bruits heard Heart sounds somewhat distant Diffuse, homogenous, harsh-musical rhonchi. Papular excoriations of the left knee. Hyperpigmentation at the left lower extremity circumferentially. Pulses LLE decreased. Trace ankle edema. R AKA. Pallor of the fingernails especially the right thumbnail.  General appearance:Adequately nourished; no acute distress , increased work of breathing is present.   Lymphatic: No lymphadenopathy about the head, neck, axilla . Eyes: No conjunctival inflammation or lid edema is present. There is no scleral icterus. Ears:  External ear exam shows no significant lesions or deformities.   Nose:  External nasal examination shows no deformity or inflammation. Nasal mucosa are pink and moist without lesions ,exudates Oral exam: lips and gums are healthy appearing.There is no oropharyngeal erythema or exudate . Neck:  No thyromegaly, masses, tenderness noted.    Heart:  Normal rate and regular rhythm. S1 and S2 normal without gallop, murmur, click, rub .  Abdomen:Bowel sounds are normal. Abdomen is soft and nontender with no organomegaly, hernias,masses. GU: deferred. Extremities:  No cyanosis, clubbing Neurologic  exam : Strength equal  in upper extremities Balance,Rhomberg,finger to nose testing could not be completed due to clinical state Deep tendon reflexes are equal in UE Skin: Warm & dry w/o tenting.  See summary under each active problem in the Problem  List with associated updated therapeutic plan

## 2016-02-26 NOTE — Patient Instructions (Signed)
BMET Smoking cessation discussed, he has no interest in discontinuing.

## 2016-02-26 NOTE — Patient Instructions (Signed)
Precautions discussed. Continue current medications.

## 2016-02-26 NOTE — Assessment & Plan Note (Signed)
Seen by Dr. Oneida Alar 02/19/17 clinically stable to improved with improvement in skin lesions and edema of the left lower extremity Follow-up in 3-4 months

## 2016-02-26 NOTE — Assessment & Plan Note (Signed)
BMET 

## 2016-02-26 NOTE — Assessment & Plan Note (Addendum)
He has no interest in discontinuing smoking Continue pulmonary toilet

## 2016-02-26 NOTE — Assessment & Plan Note (Signed)
Control adequate, no change in therapy

## 2016-02-26 NOTE — Progress Notes (Signed)
Subjective: my right shoulder hurts    Patient ID: Edwin [name], male    DOB: 27-May-1956, 60 y.o.   MRN: [id number]  HPI He has right shoulder pain for many years.  He is a resident at [facility].  He is in a wheelchair secondary to above knee amputation of the right leg several years ago post infection.  His right shoulder has become more and more painful.  He has limited motion secondary to pain.  He has no redness or numbness.  He has no trauma.  He has popping pain with motion.  He was given NSAIDs at the nursing home but had to stop because of kidney problems.  He has multiple other medical issues.  Heat and ice used to help but not now.  He does now want to consider any surgery or any injection to the shoulder at this time.  He just wants to know what is wrong.  I have shown him his x-rays and showed him the severe degenerative changes he has in the shoulder with cyst formation and bone on bone apposition.  He appears to understand.  He says he will hold off any consideration of surgery at this time.  I will see him as needed.   Review of Systems  HENT: Negative for congestion.   Respiratory: Positive for shortness of breath. Negative for cough.   Cardiovascular: Positive for chest pain. Negative for leg swelling.  Endocrine: Positive for cold intolerance.  Musculoskeletal: Positive for arthralgias.  Allergic/Immunologic: Positive for environmental allergies.   Past Medical History:  Diagnosis Date  . Anemia, unspecified   . Arthritis    "shoulders" (09/22/2014)  . Asthma   . Atrial fibrillation ([place])   . Carotid artery occlusion   . CHF (congestive heart failure) ([place])   . COPD 11/27/2007   Qualifier: Diagnosis of  By: Garen Grams    . Coronary artery disease    Cardiac catheterization in 2008 showed 99% stenosis in proximal left circumflex which was treated with Taxus drug-eluting stent. There was mild RCA and LAD disease with normal ejection fraction.  Marland Kitchen  ERECTILE DYSFUNCTION 11/27/2007   Qualifier: Diagnosis of  By: Garen Grams    . Gastric ulcer, unspecified as acute or chronic, without mention of hemorrhage, perforation, or obstruction   . Heart murmur   . HYPERLIPIDEMIA 11/27/2007   Qualifier: Diagnosis of  By: Garen Grams    . Hypertension   . HYPERTENSION, BENIGN 05/23/2009   Qualifier: Diagnosis of  By: Melvyn Novas MD, Christena Deem   . Other severe protein-calorie malnutrition   . PAD (peripheral artery disease) (HCC)    Previous right SFA stent in 2003. Directional atherectomy right SFA in 01/2013  . Pneumonia 2000  . Pressure ulcer, lower back(707.03)   . Tobacco use   . Traumatic amputation of leg(s) (complete) (partial), unilateral, at or above knee, without mention of complication     Past Surgical History:  Procedure Laterality Date  . ABDOMINAL AORTAGRAM N/A 02/23/2013   Procedure: ABDOMINAL Maxcine Ham;  Surgeon: Wellington Hampshire, MD;  Location: [place] CATH LAB;  Service: Cardiovascular;  Laterality: N/A;  . ABDOMINAL AORTAGRAM N/A 09/22/2014   Procedure: ABDOMINAL Maxcine Ham;  Surgeon: Elam Dutch, MD;  Location: [facility] CATH LAB;  Service: Cardiovascular;  Laterality: N/A;  . ACNE CYST REMOVAL    . AMPUTATION Right 09/09/2013   Procedure: AMPUTATION ABOVE KNEE;  Surgeon: Rosetta Posner, MD;  Location: [place];  Service: Vascular;  Laterality: Right;  .  ATHERECTOMY N/A 03/02/2013   Procedure: ATHERECTOMY;  Surgeon: Wellington Hampshire, MD;  Location: [facility] CATH LAB;  Service: Cardiovascular;  Laterality: N/A;  . CARDIAC CATHETERIZATION N/A 09/03/2015   Procedure: Left Heart Cath and Coronary Angiography;  Surgeon: Leonie Man, MD;  Location: [place] CV LAB;  Service: Cardiovascular;  Laterality: N/A;  . CARDIAC CATHETERIZATION N/A 09/03/2015   Procedure: Coronary Balloon Angioplasty;  Surgeon: Leonie Man, MD;  Location: [place] CV LAB;  Service: Cardiovascular;  Laterality: N/A;  . CORONARY ANGIOPLASTY  09/03/2015  .  ENDARTERECTOMY Left 08/16/2013   Procedure: Exploration of Left Neck/Fix Bleeding;  Surgeon: Elam Dutch, MD;  Location: [place];  Service: Vascular;  Laterality: Left;  . ENDARTERECTOMY FEMORAL Left 01/30/2016   Procedure: ENDARTERECTOMY LEFT FEMORAL ARTERY;  Surgeon: Elam Dutch, MD;  Location: [facility] OR;  Service: Vascular;  Laterality: Left;  . ESOPHAGOGASTRODUODENOSCOPY N/A 09/08/2013   Procedure: ESOPHAGOGASTRODUODENOSCOPY (EGD);  Surgeon: Cleotis Nipper, MD;  Location: [facility] ENDOSCOPY;  Service: Endoscopy;  Laterality: N/A;  . ESOPHAGOGASTRODUODENOSCOPY (EGD) WITH PROPOFOL N/A 01/05/2014   Procedure: ESOPHAGOGASTRODUODENOSCOPY (EGD) WITH PROPOFOL;  Surgeon: Cleotis Nipper, MD;  Location: [facility] ENDOSCOPY;  Service: Endoscopy;  Laterality: N/A;  . FEMORAL ARTERY STENT Right 2003   [name] 09/22/2014  . FLEXIBLE SIGMOIDOSCOPY N/A 09/08/2013   Procedure: FLEXIBLE SIGMOIDOSCOPY;  Surgeon: Cleotis Nipper, MD;  Location: [facility] ENDOSCOPY;  Service: Endoscopy;  Laterality: N/A;  unprepp  . ILIAC ARTERY STENT Left 09/22/2014  . NASAL SINUS SURGERY  2004  . PAROTIDECTOMY Right 03/29/2014   Procedure: PAROTIDECTOMY;  Surgeon: Ascencion Dike, MD;  Location: [place];  Service: ENT;  Laterality: Right;  . PATCH ANGIOPLASTY Left 01/30/2016   Procedure: LEFT FEMORAL ARTYERY PATCH ANGIOPLASTY USING HEMASHIELD PLATINUM FINESSE PATCH;  Surgeon: Elam Dutch, MD;  Location: [place];  Service: Vascular;  Laterality: Left;  . PERIPHERAL VASCULAR CATHETERIZATION N/A 08/17/2015   Procedure: Abdominal Aortogram;  Surgeon: Elam Dutch, MD;  Location: [place] CV LAB;  Service: Cardiovascular;  Laterality: N/A;    Current Outpatient Prescriptions on File Prior to Visit  Medication Sig Dispense Refill  . aspirin EC 81 MG tablet Take 1 tablet (81 mg total) by mouth daily. 150 tablet 2  . cloNIDine (CATAPRES) 0.2 MG tablet Take 0.2 mg by mouth 2 (two) times daily.    . clopidogrel (PLAVIX) 75 MG tablet Take 75 mg by mouth  daily. @@ 9:00 pm    . clotrimazole (MYCELEX) 10 MG troche Take 1 tablet (10 mg total) by mouth 5 (five) times daily. 35 tablet   . colchicine 0.6 MG tablet Take 0.6 mg by mouth daily.     . Fluticasone-Salmeterol (ADVAIR) 250-50 MCG/DOSE AEPB Inhale 1 puff into the lungs 2 (two) times daily.    . furosemide (LASIX) 20 MG tablet Take 20 mg by mouth daily. Sunday, Tuesday, Thursday, Saturday    . furosemide (LASIX) 40 MG tablet Take 40 mg by mouth 3 (three) times a week. Mon, Wed and Fri    . isosorbide mononitrate (IMDUR) 30 MG 24 hr tablet Take 1 tablet (30 mg total) by mouth daily. 30 tablet 5  . lisinopril (PRINIVIL,ZESTRIL) 40 MG tablet Take 40 mg by mouth daily. Reported on 11/22/2015    . metoprolol tartrate (LOPRESSOR) 25 MG tablet Take 1 tablet (25 mg total) by mouth 2 (two) times daily. 60 tablet 5  . oxyCODONE (ROXICODONE) 15 MG immediate release tablet Take one tablet by mouth  every 4 hours as needed for pain 180 tablet 0  . pantoprazole (PROTONIX) 40 MG tablet Take 40 mg by mouth daily.    . potassium chloride SA (K-DUR,KLOR-CON) 20 MEQ tablet Take 20 mEq by mouth daily.     . simvastatin (ZOCOR) 40 MG tablet Take 40 mg by mouth daily.    . temazepam (RESTORIL) 30 MG capsule Take one capsule by mouth at bedtime as needed for insomnia 30 capsule 5   No current facility-administered medications on file prior to visit.     Social History   Social History  . Marital status: Single    Spouse name: N/A  . Number of children: N/A  . Years of education: 15   Occupational History  . 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 Smoking status: Current Every Day Smoker    Packs/day: 1.00    Years: 41.00    Types: Cigarettes  . Smokeless tobacco: Never Used     Comment: stated 1/2-1 PPD  . Alcohol use No     Comment: none since moving to nursing home in March 2015  . Drug use: No  . Sexual activity: Not Currently   Other Topics Concern  . Not on file    Social History Narrative   ** Merged History Encounter **       Regular exercise-no   Caffeine Use-yes    Family History  Problem Relation Age of Onset  . Adopted: Yes  . Heart disease Maternal Grandfather   . Heart disease Paternal Grandfather     BP 136/84   Pulse 61   Temp 97 F (36.1 C)      Objective:   Physical Exam  Constitutional: He is oriented to person, place, and time. He appears well-developed and well-nourished.  HENT:  Head: Normocephalic and atraumatic.  Eyes: Conjunctivae and EOM are normal. Pupils are equal, round, and reactive to light.  Neck: Normal range of motion. Neck supple.  Cardiovascular: Normal rate, regular rhythm and intact distal pulses.   Pulmonary/Chest: Effort normal.  Abdominal: Soft.  Musculoskeletal: He exhibits tenderness (Pain of right shoulder, abduction 90, forward 100, internal 20, external 30, extension 5, abduction nearly full, crepitus, NV intact.  Left shoudler negative.  Neck full ROM.  Above knee amputation right.).  Neurological: He is alert and oriented to person, place, and time. He has normal reflexes. He displays normal reflexes. No cranial nerve deficit. He exhibits normal muscle tone. Coordination normal.  Skin: Skin is warm and dry.  Psychiatric: He has a normal mood and affect. His behavior is normal. Judgment and thought content normal.    He smokes.  I have talked to him about cutting back.  He is not willing to do that he says.      Assessment & Plan:   Encounter Diagnoses  Name Primary?  . Primary osteoarthritis of right shoulder Yes  . H/O above knee amputation, right ([place])   . Tobacco smoker within last 12 months    I will see him as needed.  Call as needed.  Electronically Signed Sanjuana Kava, MD 8/1/201712:08 PM

## 2016-02-27 ENCOUNTER — Encounter (HOSPITAL_COMMUNITY)
Admission: RE | Admit: 2016-02-27 | Discharge: 2016-02-27 | Disposition: A | Discharge status: Home / Self Care | Payer: Medicare Other | Source: Skilled Nursing Facility | Attending: Internal Medicine | Admitting: Internal Medicine

## 2016-02-27 DIAGNOSIS — D649 Anemia, unspecified: Secondary | ICD-10-CM | POA: Insufficient documentation

## 2016-02-27 DIAGNOSIS — J449 Chronic obstructive pulmonary disease, unspecified: Secondary | ICD-10-CM | POA: Diagnosis not present

## 2016-02-27 DIAGNOSIS — G4700 Insomnia, unspecified: Secondary | ICD-10-CM | POA: Diagnosis not present

## 2016-02-27 DIAGNOSIS — Z89511 Acquired absence of right leg below knee: Secondary | ICD-10-CM | POA: Insufficient documentation

## 2016-02-27 DIAGNOSIS — K259 Gastric ulcer, unspecified as acute or chronic, without hemorrhage or perforation: Secondary | ICD-10-CM | POA: Insufficient documentation

## 2016-02-27 LAB — BASIC METABOLIC PANEL
ANION GAP: 6 (ref 5–15)
BUN: 32 mg/dL — ABNORMAL HIGH (ref 6–20)
CO2: 34 mmol/L — AB (ref 22–32)
Calcium: 9.1 mg/dL (ref 8.9–10.3)
Chloride: 100 mmol/L — ABNORMAL LOW (ref 101–111)
Creatinine, Ser: 1.81 mg/dL — ABNORMAL HIGH (ref 0.61–1.24)
GFR, EST AFRICAN AMERICAN: 45 mL/min — AB (ref >60)
GFR, EST NON AFRICAN AMERICAN: 39 mL/min — AB (ref >60)
GLUCOSE: 131 mg/dL — AB (ref 65–99)
POTASSIUM: 4.2 mmol/L (ref 3.5–5.1)
Sodium: 140 mmol/L (ref 135–145)

## 2016-03-05 ENCOUNTER — Encounter (HOSPITAL_COMMUNITY)
Admission: RE | Admit: 2016-03-05 | Discharge: 2016-03-05 | Disposition: A | Discharge status: Home / Self Care | Payer: Medicare Other | Source: Skilled Nursing Facility | Attending: *Deleted | Admitting: *Deleted

## 2016-03-05 DIAGNOSIS — Z89511 Acquired absence of right leg below knee: Secondary | ICD-10-CM | POA: Diagnosis not present

## 2016-03-05 DIAGNOSIS — K259 Gastric ulcer, unspecified as acute or chronic, without hemorrhage or perforation: Secondary | ICD-10-CM | POA: Diagnosis not present

## 2016-03-05 DIAGNOSIS — D649 Anemia, unspecified: Secondary | ICD-10-CM | POA: Diagnosis not present

## 2016-03-05 DIAGNOSIS — G4700 Insomnia, unspecified: Secondary | ICD-10-CM | POA: Diagnosis not present

## 2016-03-05 DIAGNOSIS — J449 Chronic obstructive pulmonary disease, unspecified: Secondary | ICD-10-CM | POA: Diagnosis not present

## 2016-03-05 LAB — BASIC METABOLIC PANEL
ANION GAP: 6 (ref 5–15)
BUN: 34 mg/dL — ABNORMAL HIGH (ref 6–20)
CALCIUM: 8.6 mg/dL — AB (ref 8.9–10.3)
CO2: 33 mmol/L — AB (ref 22–32)
CREATININE: 1.85 mg/dL — AB (ref 0.61–1.24)
Chloride: 99 mmol/L — ABNORMAL LOW (ref 101–111)
GFR, EST AFRICAN AMERICAN: 44 mL/min — AB (ref >60)
GFR, EST NON AFRICAN AMERICAN: 38 mL/min — AB (ref >60)
Glucose, Bld: 120 mg/dL — ABNORMAL HIGH (ref 65–99)
Potassium: 3.7 mmol/L (ref 3.5–5.1)
Sodium: 138 mmol/L (ref 135–145)

## 2016-03-12 ENCOUNTER — Encounter (HOSPITAL_COMMUNITY)
Admission: RE | Admit: 2016-03-12 | Discharge: 2016-03-12 | Disposition: A | Discharge status: Home / Self Care | Payer: Medicare Other | Source: Skilled Nursing Facility | Attending: Internal Medicine | Admitting: Internal Medicine

## 2016-03-12 DIAGNOSIS — Z89511 Acquired absence of right leg below knee: Secondary | ICD-10-CM | POA: Diagnosis not present

## 2016-03-12 DIAGNOSIS — J449 Chronic obstructive pulmonary disease, unspecified: Secondary | ICD-10-CM | POA: Diagnosis not present

## 2016-03-12 DIAGNOSIS — D649 Anemia, unspecified: Secondary | ICD-10-CM | POA: Diagnosis not present

## 2016-03-12 DIAGNOSIS — K259 Gastric ulcer, unspecified as acute or chronic, without hemorrhage or perforation: Secondary | ICD-10-CM | POA: Diagnosis not present

## 2016-03-12 DIAGNOSIS — G4700 Insomnia, unspecified: Secondary | ICD-10-CM | POA: Diagnosis not present

## 2016-03-12 LAB — BASIC METABOLIC PANEL
Anion gap: 3 — ABNORMAL LOW (ref 5–15)
BUN: 28 mg/dL — AB (ref 6–20)
CHLORIDE: 100 mmol/L — AB (ref 101–111)
CO2: 31 mmol/L (ref 22–32)
Calcium: 8.7 mg/dL — ABNORMAL LOW (ref 8.9–10.3)
Creatinine, Ser: 1.76 mg/dL — ABNORMAL HIGH (ref 0.61–1.24)
GFR calc Af Amer: 47 mL/min — ABNORMAL LOW (ref >60)
GFR calc non Af Amer: 41 mL/min — ABNORMAL LOW (ref >60)
GLUCOSE: 103 mg/dL — AB (ref 65–99)
POTASSIUM: 4.1 mmol/L (ref 3.5–5.1)
Sodium: 134 mmol/L — ABNORMAL LOW (ref 135–145)

## 2016-03-20 ENCOUNTER — Other Ambulatory Visit: Payer: Self-pay

## 2016-03-20 MED ORDER — OXYCODONE HCL 15 MG PO TABS: ORAL_TABLET | ORAL | 0 refills | Status: DC

## 2016-03-20 NOTE — Telephone Encounter (Signed)
RX faxed to [facility] @ [phone]. Phone number [phone]  

## 2016-03-21 ENCOUNTER — Other Ambulatory Visit: Payer: Self-pay | Admitting: *Deleted

## 2016-03-21 MED ORDER — TEMAZEPAM 30 MG PO CAPS: ORAL_CAPSULE | ORAL | 5 refills | Status: DC

## 2016-03-21 NOTE — Telephone Encounter (Signed)
[facility] Nursing #[phone] Fax: [phone]   

## 2016-03-28 ENCOUNTER — Other Ambulatory Visit (HOSPITAL_COMMUNITY)
Admission: AD | Admit: 2016-03-28 | Discharge: 2016-03-28 | Disposition: A | Discharge status: Home / Self Care | Payer: Medicare Other | Source: Skilled Nursing Facility | Attending: Internal Medicine | Admitting: Internal Medicine

## 2016-03-28 DIAGNOSIS — K259 Gastric ulcer, unspecified as acute or chronic, without hemorrhage or perforation: Secondary | ICD-10-CM | POA: Diagnosis not present

## 2016-03-28 DIAGNOSIS — J449 Chronic obstructive pulmonary disease, unspecified: Secondary | ICD-10-CM | POA: Diagnosis not present

## 2016-03-28 LAB — BASIC METABOLIC PANEL
Anion gap: 11 (ref 5–15)
BUN: 27 mg/dL — AB (ref 6–20)
CHLORIDE: 92 mmol/L — AB (ref 101–111)
CO2: 30 mmol/L (ref 22–32)
Calcium: 9 mg/dL (ref 8.9–10.3)
Creatinine, Ser: 1.82 mg/dL — ABNORMAL HIGH (ref 0.61–1.24)
GFR calc Af Amer: 45 mL/min — ABNORMAL LOW (ref >60)
GFR calc non Af Amer: 39 mL/min — ABNORMAL LOW (ref >60)
Glucose, Bld: 109 mg/dL — ABNORMAL HIGH (ref 65–99)
POTASSIUM: 3.9 mmol/L (ref 3.5–5.1)
SODIUM: 133 mmol/L — AB (ref 135–145)

## 2016-03-29 LAB — HEMOGLOBIN A1C
HEMOGLOBIN A1C: 5.7 % — AB (ref 4.8–5.6)
Mean Plasma Glucose: 117 mg/dL

## 2016-04-08 ENCOUNTER — Encounter: Payer: Self-pay | Admitting: Internal Medicine

## 2016-04-08 ENCOUNTER — Non-Acute Institutional Stay (SKILLED_NURSING_FACILITY): Payer: Medicare Other | Admitting: Internal Medicine

## 2016-04-08 DIAGNOSIS — J42 Unspecified chronic bronchitis: Secondary | ICD-10-CM | POA: Diagnosis not present

## 2016-04-08 DIAGNOSIS — I739 Peripheral vascular disease, unspecified: Secondary | ICD-10-CM

## 2016-04-08 DIAGNOSIS — F17200 Nicotine dependence, unspecified, uncomplicated: Secondary | ICD-10-CM

## 2016-04-08 DIAGNOSIS — Z720 Tobacco use: Secondary | ICD-10-CM | POA: Diagnosis not present

## 2016-04-08 DIAGNOSIS — I10 Essential (primary) hypertension: Secondary | ICD-10-CM | POA: Diagnosis not present

## 2016-04-08 NOTE — Progress Notes (Signed)
Location:   [place] Room Number: 107/W Place of Service:  SNF [phone]) Provider:  Haze Justin, MD  Patient Care Team: Hendricks Limes, MD as PCP - General (Internal Medicine) [name], MD as Consulting Physician [facility])  Extended Emergency Contact Information Primary Emergency Contact: [place], [place] [address] [place] Mobile Phone: [phone] Relation: Daughter  Code Status:  Full Code Goals of care: Advanced Directive information Advanced Directives 04/08/2016  Does patient have an advance directive? Yes  Type of Advance Directive (No Data)  Does patient want to make changes to advanced directive? No - Patient declined  Copy of advanced directive(s) in chart? Yes  Would patient like information on creating an advanced directive? -  Pre-existing out of facility DNR order (yellow form or pink MOST form) -     Chief Complaint  Patient presents with  . Medical Management of Chronic Issues    Routine Visit    HPI:  Pt is a 60 y.o. male seen today for medical management of chronic diseases. Patient is doing well and had new complains. He said he really doesn't want to be examined. He denies having any Chest pain , SOB, Cough.. He is sleeping well and has good appetite.   Past Medical History:  Diagnosis Date  . Anemia, unspecified   . Arthritis    "shoulders" (09/22/2014)  . Asthma   . Atrial fibrillation ([place])   . Carotid artery occlusion   . CHF (congestive heart failure) ([place])   . COPD 11/27/2007   Qualifier: Diagnosis of  By: Garen Grams    . Coronary artery disease    Cardiac catheterization in 2008 showed 99% stenosis in proximal left circumflex which was treated with Taxus drug-eluting stent. There was mild RCA and LAD disease with normal ejection fraction.  Marland Kitchen ERECTILE DYSFUNCTION 11/27/2007   Qualifier: Diagnosis of  By: Garen Grams    . Gastric ulcer, unspecified as  acute or chronic, without mention of hemorrhage, perforation, or obstruction   . Heart murmur   . HYPERLIPIDEMIA 11/27/2007   Qualifier: Diagnosis of  By: Garen Grams    . Hypertension   . HYPERTENSION, BENIGN 05/23/2009   Qualifier: Diagnosis of  By: Melvyn Novas MD, Christena Deem   . Other severe protein-calorie malnutrition   . PAD (peripheral artery disease) (HCC)    Previous right SFA stent in 2003. Directional atherectomy right SFA in 01/2013  . Pneumonia 2000  . Pressure ulcer, lower back(707.03)   . Tobacco use   . Traumatic amputation of leg(s) (complete) (partial), unilateral, at or above knee, without mention of complication    Past Surgical History:  Procedure Laterality Date  . ABDOMINAL AORTAGRAM N/A 02/23/2013   Procedure: ABDOMINAL Maxcine Ham;  Surgeon: Wellington Hampshire, MD;  Location: [place] CATH LAB;  Service: Cardiovascular;  Laterality: N/A;  . ABDOMINAL AORTAGRAM N/A 09/22/2014   Procedure: ABDOMINAL Maxcine Ham;  Surgeon: Elam Dutch, MD;  Location: [facility] CATH LAB;  Service: Cardiovascular;  Laterality: N/A;  . ACNE CYST REMOVAL    . AMPUTATION Right 09/09/2013   Procedure: AMPUTATION ABOVE KNEE;  Surgeon: Rosetta Posner, MD;  Location: [place];  Service: Vascular;  Laterality: Right;  . ATHERECTOMY N/A 03/02/2013   Procedure: ATHERECTOMY;  Surgeon: Wellington Hampshire, MD;  Location: [place] CATH LAB;  Service: Cardiovascular;  Laterality: N/A;  . CARDIAC CATHETERIZATION N/A 09/03/2015   Procedure: Left Heart Cath  and Coronary Angiography;  Surgeon: Leonie Man, MD;  Location: [place] CV LAB;  Service: Cardiovascular;  Laterality: N/A;  . CARDIAC CATHETERIZATION N/A 09/03/2015   Procedure: Coronary Balloon Angioplasty;  Surgeon: Leonie Man, MD;  Location: [place] CV LAB;  Service: Cardiovascular;  Laterality: N/A;  . CORONARY ANGIOPLASTY  09/03/2015  . ENDARTERECTOMY Left 08/16/2013   Procedure: Exploration of Left Neck/Fix Bleeding;  Surgeon: Elam Dutch, MD;  Location: [place];  Service: Vascular;  Laterality: Left;  . ENDARTERECTOMY FEMORAL Left 01/30/2016   Procedure: ENDARTERECTOMY LEFT FEMORAL ARTERY;  Surgeon: Elam Dutch, MD;  [facility] OR;  Service: Vascular;  Laterality: Left;  . ESOPHAGOGASTRODUODENOSCOPY N/A 09/08/2013   Procedure: ESOPHAGOGASTRODUODENOSCOPY (EGD);  Surgeon: Cleotis Nipper, MD;  Location: [facility] ENDOSCOPY;  Service: Endoscopy;  Laterality: N/A;  . ESOPHAGOGASTRODUODENOSCOPY (EGD) WITH PROPOFOL N/A 01/05/2014   Procedure: ESOPHAGOGASTRODUODENOSCOPY (EGD) WITH PROPOFOL;  Surgeon: Cleotis Nipper, MD;  Location: [facility] ENDOSCOPY;  Service: Endoscopy;  Laterality: N/A;  . FEMORAL ARTERY STENT Right 2003   [name] 09/22/2014  . FLEXIBLE SIGMOIDOSCOPY N/A 09/08/2013   Procedure: FLEXIBLE SIGMOIDOSCOPY;  Surgeon: Cleotis Nipper, MD;  Location: [facility] ENDOSCOPY;  Service: Endoscopy;  Laterality: N/A;  unprepp  . ILIAC ARTERY STENT Left 09/22/2014  . NASAL SINUS SURGERY  2004  . PAROTIDECTOMY Right 03/29/2014   Procedure: PAROTIDECTOMY;  Surgeon: Ascencion Dike, MD;  Location: [place];  Service: ENT;  Laterality: Right;  . PATCH ANGIOPLASTY Left 01/30/2016   Procedure: LEFT FEMORAL ARTYERY PATCH ANGIOPLASTY USING HEMASHIELD PLATINUM FINESSE PATCH;  Surgeon: Elam Dutch, MD;  Location: [place];  Service: Vascular;  Laterality: Left;  . PERIPHERAL VASCULAR CATHETERIZATION N/A 08/17/2015   Procedure: Abdominal Aortogram;  Surgeon: Elam Dutch, MD;  Location: [place] CV LAB;  Service: Cardiovascular;  Laterality: N/A;    Allergies  Allergen Reactions  . Codeine Nausea Only      Medication List       Accurate as of 04/08/16  1:48 PM. Always use your most recent med list.          aspirin EC 81 MG tablet Take 1 tablet (81 mg total) by mouth daily.   cloNIDine 0.2 MG tablet Commonly known as:  CATAPRES Take 0.2 mg by mouth 2 (two) times daily.   clopidogrel 75 MG tablet Commonly known as:  PLAVIX Take 75 mg by mouth daily. @@ 9:00 pm     colchicine 0.6 MG tablet Take 0.6 mg by mouth daily.   Fluticasone-Salmeterol 250-50 MCG/DOSE Aepb Commonly known as:  ADVAIR Inhale 1 puff into the lungs 2 (two) times daily.   furosemide 20 MG tablet Commonly known as:  LASIX Take 20 mg by mouth daily. Sunday, Tuesday, Thursday, Saturday   furosemide 40 MG tablet Commonly known as:  LASIX Take 40 mg by mouth 3 (three) times a week. Mon, Wed and Fri   isosorbide mononitrate 30 MG 24 hr tablet Commonly known as:  IMDUR Take 1 tablet (30 mg total) by mouth daily.   lisinopril 40 MG tablet Commonly known as:  PRINIVIL,ZESTRIL Take 40 mg by mouth daily. Reported on 11/22/2015   metoprolol tartrate 25 MG tablet Commonly known as:  LOPRESSOR Take 1 tablet (25 mg total) by mouth 2 (two) times daily.   oxyCODONE 15 MG immediate release tablet Commonly known as:  ROXICODONE Take one tablet by mouth every 4 hours as needed for pain   pantoprazole 40 MG tablet Commonly known  as:  PROTONIX Take 40 mg by mouth daily.   potassium chloride SA 20 MEQ tablet Commonly known as:  K-DUR,KLOR-CON Take 20 mEq by mouth daily.   simvastatin 40 MG tablet Commonly known as:  ZOCOR Take 40 mg by mouth daily.   temazepam 30 MG capsule Commonly known as:  RESTORIL Take one capsule by mouth at bedtime as needed for insomnia       Review of Systems  Constitutional: Negative.   HENT: Negative.   Respiratory: Negative.   Cardiovascular: Negative.   Neurological: Negative.     Immunization History  Administered Date(s) Administered  . DTP 05/28/2001  . Influenza-Unspecified 05/26/2013, 05/04/2014  . Pneumococcal-Unspecified 07/28/2009  . Tdap 12/30/2012   Pertinent  Health Maintenance Due  Topic Date Due  . INFLUENZA VACCINE  06/27/2016 (Originally 02/26/2016)  . COLONOSCOPY  11/22/2016 (Originally 11/24/2006)   Fall Risk  12/30/2012  Falls in the past year? No   Functional Status Survey:    Vitals:   04/08/16 1014  BP: (!)  184/80 repeat done manually was 130/85  Pulse: (!) 56  Resp: 20  Temp: 98.7 F (37.1 C)  TempSrc: Oral  SpO2: 93%   There is no height or weight on file to calculate BMI. Physical Exam  Constitutional: He is oriented to person, place, and time. He appears well-developed and well-nourished.  HENT:  Head: Normocephalic.  Mouth/Throat: Oropharynx is clear and moist.  Poor dental hygeine  Cardiovascular: Normal rate, regular rhythm and normal heart sounds.   Pulmonary/Chest: No respiratory distress. He has no rales.  B/L Expiratory wheezing.  Abdominal: Bowel sounds are normal. He exhibits no distension. There is no tenderness.  Neurological: He is alert and oriented to person, place, and time.  Normal strength in all extremities.  Skin:  Edema and chronic Venous changes in Left LE.    Labs reviewed:  Recent Labs  03/05/16 0710 03/12/16 0705 03/28/16 0715  NA 138 134* 133*  K 3.7 4.1 3.9  CL 99* 100* 92*  CO2 33* 31 30  GLUCOSE 120* 103* 109*  BUN 34* 28* 27*  CREATININE 1.85* 1.76* 1.82*  CALCIUM 8.6* 8.7* 9.0    Recent Labs  08/29/15 1055 01/17/16 0145 01/30/16 0739  AST 19 21 18   ALT 15* 16* 12*  ALKPHOS 63 62 52  BILITOT 0.3 0.8 0.5  PROT 6.7 7.5 6.1*  ALBUMIN 3.8 4.1 3.4*    Recent Labs  05/23/15 0545 07/11/15 0900  09/24/15 0300 01/17/16 0145 01/30/16 0739 01/31/16 0530  WBC 7.0 6.7  < > 7.7 8.2 6.8 7.7  NEUTROABS 4.2 3.7  --  4.8  --   --   --   HGB 15.1 15.4  < > 14.9 15.6 14.5 13.9  HCT 45.8 45.9  < > 45.6 46.8 46.3 46.2  MCV 85.1 88.4  < > 93.4 88.5 92.4 94.1  PLT 156 167  < > 177 234 185 181  < > = values in this interval not displayed. Lab Results  Component Value Date   TSH 3.881 09/13/2013   Lab Results  Component Value Date   HGBA1C 5.7 (H) 03/28/2016   Lab Results  Component Value Date   CHOL 127 01/17/2016   HDL 25 (L) 01/17/2016   LDLCALC 67 01/17/2016   TRIG 177 (H) 01/17/2016   CHOLHDL 5.1 01/17/2016    Significant  Diagnostic Results in last 30 days:  No results found.  Assessment/Plan  PAD (peripheral artery disease)  Continue on Aspirin  and Plavix. Patient has Follow up appointment with Vascular surgeon.   Essential hypertension  BP is  Stable. Because of his Peripheral arterial disese it needs to be checked manually. Continue Lisinopril and Lopressor.  Chronic bronchitis  Continue his inhalers. Patient is not interested in smoking cessation.  Current smoker No interested in smoking cessation  Chronic renal insufficiency Creat stable at 1.8 Hyperlipidemia Last LDL was 67 in 06/17 Continue Zocor.  Patient is due for his screening colonoscopy next year.    Family/ staff Communication:   Labs/tests ordered:

## 2016-04-16 ENCOUNTER — Other Ambulatory Visit: Payer: Self-pay | Admitting: *Deleted

## 2016-04-16 MED ORDER — OXYCODONE HCL 15 MG PO TABS: ORAL_TABLET | ORAL | 0 refills | Status: DC

## 2016-04-16 NOTE — Telephone Encounter (Signed)
[facility] Nursing #[phone] Fax: [phone]   

## 2016-05-08 ENCOUNTER — Non-Acute Institutional Stay (SKILLED_NURSING_FACILITY): Payer: Medicare Other | Admitting: Internal Medicine

## 2016-05-08 ENCOUNTER — Encounter: Payer: Self-pay | Admitting: Internal Medicine

## 2016-05-08 DIAGNOSIS — I10 Essential (primary) hypertension: Secondary | ICD-10-CM | POA: Diagnosis not present

## 2016-05-08 DIAGNOSIS — I2510 Atherosclerotic heart disease of native coronary artery without angina pectoris: Secondary | ICD-10-CM

## 2016-05-08 DIAGNOSIS — I739 Peripheral vascular disease, unspecified: Secondary | ICD-10-CM | POA: Diagnosis not present

## 2016-05-08 DIAGNOSIS — M19011 Primary osteoarthritis, right shoulder: Secondary | ICD-10-CM | POA: Diagnosis not present

## 2016-05-08 DIAGNOSIS — J42 Unspecified chronic bronchitis: Secondary | ICD-10-CM

## 2016-05-08 DIAGNOSIS — E785 Hyperlipidemia, unspecified: Secondary | ICD-10-CM | POA: Diagnosis not present

## 2016-05-08 NOTE — Progress Notes (Signed)
Location:   [place] Room Number: 107/W Place of Service:  SNF (31) Provider:  Arlo,Lassen  No primary care provider on file.  Patient Care Team: [name], MD as Consulting Physician (Geriatric Medicine)  Extended Emergency Contact Information Primary Emergency Contact: [place], [place] [address] [place] Mobile Phone: [phone] Relation: Daughter  Code Status:  Full Code Goals of care: Advanced Directive information Advanced Directives 05/08/2016  Does patient have an advance directive? Yes  Type of Advance Directive (No Data)  Does patient want to make changes to advanced directive? No - Patient declined  Copy of advanced directive(s) in chart? Yes  Would patient like information on creating an advanced directive? -  Pre-existing out of facility DNR order (yellow form or pink MOST form) -     Chief Complaint  Patient presents with  . Medical Management of Chronic Issues    Routine Visit  Medical management of chronic medical issues including hypertension-per 4-0 disease-history COPD bronchitis-chronic renal insufficiency-hyperlipidemia-the dural joint disease with right shoulder discomfort  HPI:  Pt is a 60 y.o. male seen today for medical management of chronic diseases.  As noted above-she continues to be quite stable He does not have any acute complaints today he does have chronic complaints of shoulder discomfort more so on his right ear has seen orthopedics and was thought to have severe degenerative joint disease.  He is on oxycodone 15 mg every 4 hours which she takes fairly regularly.  He does have a history of peripheral arterial disease is followed by vascular he is status post left femoral endarterectomy.  Also has a history of carotid stenosis which so far is been asymptomatic again he is followed by vascular discharge.  He does have a history of chronic renal insufficiency most recent  creatinine 1.8 appears to be more on the upper end of his baseline we will recheck this.  Also has a history of COPD bronchitis continues on inhalers he continues to smoke and repeatedly has expressed an desire to continue to smoke.  Last yearr patient did have a partial parathyroidectomy he tolerated procedure well. 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 with clear margins appreciated after the procedure-he did have follow-up scheduled but patient did not feel this was necessary  In regards to hypertension he is on lisinopril and Lopressor as well as clonidine--- this appears to be stable recent blood pressures 130/73-139/77 occasionally he has systolic spikes but I suspect this is related at times to his physical activity in the wheelchair and going out to smoke which I suspect raises his blood pressure at times  He does have some history of a GI bleed that was thought possibly nonsteroid induced hemoglobin has been stable now for sometimes most recently 13.9 on lab done in July-he is on protonix his NSAIDs have been discontinued     Past Medical History:  Diagnosis Date  . Anemia, unspecified   . Arthritis    "shoulders" (09/22/2014)  . Asthma   . Atrial fibrillation ([place])   . Carotid artery occlusion   . CHF (congestive heart failure) ([place])   . COPD 11/27/2007   Qualifier: Diagnosis of  By: Garen Grams    . Coronary artery disease    Cardiac catheterization in 2008 showed 99% stenosis in proximal left circumflex which was treated with Taxus drug-eluting stent. There was mild RCA and LAD disease with normal ejection fraction.  Marland Kitchen  ERECTILE DYSFUNCTION 11/27/2007   Qualifier: Diagnosis of  By: Garen Grams    . Gastric ulcer, unspecified as acute or chronic, without mention of hemorrhage, perforation, or obstruction   . Heart murmur   . HYPERLIPIDEMIA 11/27/2007   Qualifier: Diagnosis of  By: Garen Grams    . Hypertension   . HYPERTENSION, BENIGN 05/23/2009   Qualifier:  Diagnosis of  By: Melvyn Novas MD, Christena Deem   . Other severe protein-calorie malnutrition   . PAD (peripheral artery disease) (H[facility])    Previous right SFA stent in 2003. Directional atherectomy right SFA in 01/2013  . Pneumonia 2000  . Pressure ulcer, lower back(707.03)   . Tobacco use   . Traumatic amputation of leg(s) (complete) (partial), unilateral, at or above knee, without mention of complication    Past Surgical History:  Procedure Laterality Date  . ABDOMINAL AORTAGRAM N/A 02/23/2013   Procedure: ABDOMINAL Maxcine Ham;  Surgeon: Wellington Hampshire, MD;  Location: [facility] CATH LAB;  Service: Cardiovascular;  Laterality: N/A;  . ABDOMINAL AORTAGRAM N/A 09/22/2014   Procedure: ABDOMINAL Maxcine Ham;  Surgeon: Elam Dutch, MD;  Location: [facility] CATH LAB;  Service: Cardiovascular;  Laterality: N/A;  . ACNE CYST REMOVAL    . AMPUTATION Right 09/09/2013   Procedure: AMPUTATION ABOVE KNEE;  Surgeon: Rosetta Posner, MD;  Location: [facility];  Service: Vascular;  Laterality: Right;  . ATHERECTOMY N/A 03/02/2013   Procedure: ATHERECTOMY;  Surgeon: Wellington Hampshire, MD;  Location: [facility] CATH LAB;  Service: Cardiovascular;  Laterality: N/A;  . CARDIAC CATHETERIZATION N/A 09/03/2015   Procedure: Left Heart Cath and Coronary Angiography;  Surgeon: Leonie Man, MD;  Location: [facility] CV LAB;  Service: Cardiovascular;  Laterality: N/A;  . CARDIAC CATHETERIZATION N/A 09/03/2015   Procedure: Coronary Balloon Angioplasty;  Surgeon: Leonie Man, MD;  Location: [place] CV LAB;  Service: Cardiovascular;  Laterality: N/A;  . CORONARY ANGIOPLASTY  09/03/2015  . ENDARTERECTOMY Left 08/16/2013   Procedure: Exploration of Left Neck/Fix Bleeding;  Surgeon: Elam Dutch, MD;  Location: [facility];  Service: Vascular;  Laterality: Left;  . ENDARTERECTOMY FEMORAL Left 01/30/2016   Procedure: ENDARTERECTOMY LEFT FEMORAL ARTERY;  Surgeon: Elam Dutch, MD;  Location: [facility] OR;  Service: Vascular;  Laterality: Left;  .  ESOPHAGOGASTRODUODENOSCOPY N/A 09/08/2013   Procedure: ESOPHAGOGASTRODUODENOSCOPY (EGD);  Surgeon: Cleotis Nipper, MD;  Location: [facility] ENDOSCOPY;  Service: Endoscopy;  Laterality: N/A;  . ESOPHAGOGASTRODUODENOSCOPY (EGD) WITH PROPOFOL N/A 01/05/2014   Procedure: ESOPHAGOGASTRODUODENOSCOPY (EGD) WITH PROPOFOL;  Surgeon: Cleotis Nipper, MD;  Location: [facility] ENDOSCOPY;  Service: Endoscopy;  Laterality: N/A;  . FEMORAL ARTERY STENT Right 2003   [name] 09/22/2014  . FLEXIBLE SIGMOIDOSCOPY N/A 09/08/2013   Procedure: FLEXIBLE SIGMOIDOSCOPY;  Surgeon: Cleotis Nipper, MD;  Location: [facility] ENDOSCOPY;  Service: Endoscopy;  Laterality: N/A;  unprepp  . ILIAC ARTERY STENT Left 09/22/2014  . NASAL SINUS SURGERY  2004  . PAROTIDECTOMY Right 03/29/2014   Procedure: PAROTIDECTOMY;  Surgeon: Ascencion Dike, MD;  Location: [facility];  Service: ENT;  Laterality: Right;  . PATCH ANGIOPLASTY Left 01/30/2016   Procedure: LEFT FEMORAL ARTYERY PATCH ANGIOPLASTY USING HEMASHIELD PLATINUM FINESSE PATCH;  Surgeon: Elam Dutch, MD;  Location: [place];  Service: Vascular;  Laterality: Left;  . PERIPHERAL VASCULAR CATHETERIZATION N/A 08/17/2015   Procedure: Abdominal Aortogram;  Surgeon: Elam Dutch, MD;  Location: [place] CV LAB;  Service: Cardiovascular;  Laterality: N/A;    Allergies  Allergen Reactions  . Codeine  Nausea Only      Medication List       Accurate as of 05/08/16  1:31 PM. Always use your most recent med list.          aspirin EC 81 MG tablet Take 1 tablet (81 mg total) by mouth daily.   cloNIDine 0.2 MG tablet Commonly known as:  CATAPRES Take 0.2 mg by mouth 2 (two) times daily.   clopidogrel 75 MG tablet Commonly known as:  PLAVIX Take 75 mg by mouth daily. @@ 9:00 pm   colchicine 0.6 MG tablet Take 0.3 mg by mouth daily.   Fluticasone-Salmeterol 250-50 MCG/DOSE Aepb Commonly known as:  ADVAIR Inhale 1 puff into the lungs 2 (two) times daily.   furosemide 20 MG tablet Commonly known  as:  LASIX Take 20 mg by mouth daily. Sunday, Tuesday, Thursday, Saturday   furosemide 40 MG tablet Commonly known as:  LASIX Take 40 mg by mouth 3 (three) times a week. Mon, Wed and Fri   isosorbide mononitrate 30 MG 24 hr tablet Commonly known as:  IMDUR Take 1 tablet (30 mg total) by mouth daily.   lisinopril 40 MG tablet Commonly known as:  PRINIVIL,ZESTRIL Take 40 mg by mouth daily. Reported on 11/22/2015   metoprolol tartrate 25 MG tablet Commonly known as:  LOPRESSOR Take 1 tablet (25 mg total) by mouth 2 (two) times daily.   oxyCODONE 15 MG immediate release tablet Commonly known as:  ROXICODONE Take one tablet by mouth every 4 hours as needed for pain   pantoprazole 40 MG tablet Commonly known as:  PROTONIX Take 40 mg by mouth daily.   potassium chloride SA 20 MEQ tablet Commonly known as:  K-DUR,KLOR-CON Take 20 mEq by mouth daily.   simvastatin 40 MG tablet Commonly known as:  ZOCOR Take 40 mg by mouth daily.   temazepam 30 MG capsule Commonly known as:  RESTORIL Take one capsule by mouth at bedtime as needed for insomnia       Review of Systems   In general is not complaining any fever or chills.  Skin does not currently complain of rashes or itching -- he does have significant sun exposure when he goes out to smoke.  Head ears eyes nose mouth and throat does not complaining of a nasal discharge visual changes sore throat.  Respiratory --hx  bronchitis COPD but is not complaining of any increased shortness of breath or increased cough.  Cardiac not complaining of chest pain has chronic lower extremity edema on the left.  GI does not complain of nausea vomiting diarrhea constipation or abdominal discomfort.  Musculoskeletal  Says he has somewhat chronic right shoulder pain has been seen by a pediatric as noted above oxycodone he says helped some  Neurologic is not complaining of dizziness headache or syncopal episodes.  Psych is not  complaining of overt anxiety or depression appears to be doing well with his routine that he has established here   Immunization History  Administered Date(s) Administered  . DTP 05/28/2001  . Influenza-Unspecified 05/26/2013, 05/04/2014, 04/29/2016  . Pneumococcal-Unspecified 07/28/2009  . Tdap 12/30/2012   Pertinent  Health Maintenance Due  Topic Date Due  . COLONOSCOPY  11/22/2016 (Originally 11/24/2006)  . INFLUENZA VACCINE  Completed   Fall Risk  12/30/2012  Falls in the past year? No   Functional Status Survey:    Vitals:   05/08/16 1225  BP: 130/73  Pulse: 62  Resp: 20  Temp: 97.9 F (36.6 C)  TempSrc: Oral  SpO2: 97%  Weight: 218 lb 3.2 oz (99 kg)  Height: 5\' 8"  (1.727 m)   Body mass index is 33.18 kg/m. Physical Exam  In general this is a well-nourished middle-aged male in no distress sitting comfortably in his wheelchair  His skin is warm and dry does have pattern alopecia  .  His skin is warm and dry Has lower extremity venous stasis changes on his left lower extremity also has significant sun exposure mainly face and arms  Oropharynx clear mucous membranes moist.  Chest he does have some diffuse rhonchi which appears to be baseline there is no labored breathing.  Heart is regular rate and rhythm without murmur gallop or rub he has baseline  edema of his left lower extremity this appears to be unchanged from previous exam Continues to have venous stasis changes as well.  Abdomen is obese soft nontender with positive bowel sounds.  Musculoskeletal is status post right AKA-moves all other extremities at baseline-ambulates in a wheelchair. Is able to push wheelchair with his arms bilaterally actually has significant strength upper extremities bilaterally does complain of right shoulder discomfort I do not note any overt deformity  Neurologic is grossly intact no lateralizing findings her speech is clear.  Psych he is alert and oriented pleasant  and appropriate.   Labs reviewed:  Recent Labs  03/05/16 0710 03/12/16 0705 03/28/16 0715  NA 138 134* 133*  K 3.7 4.1 3.9  CL 99* 100* 92*  CO2 33* 31 30  GLUCOSE 120* 103* 109*  BUN 34* 28* 27*  CREATININE 1.85* 1.76* 1.82*  CALCIUM 8.6* 8.7* 9.0    Recent Labs  08/29/15 1055 01/17/16 0145 01/30/16 0739  AST 19 21 18   ALT 15* 16* 12*  ALKPHOS 63 62 52  BILITOT 0.3 0.8 0.5  PROT 6.7 7.5 6.1*  ALBUMIN 3.8 4.1 3.4*    Recent Labs  05/23/15 0545 07/11/15 0900  09/24/15 0300 01/17/16 0145 01/30/16 0739 01/31/16 0530  WBC 7.0 6.7  < > 7.7 8.2 6.8 7.7  NEUTROABS 4.2 3.7  --  4.8  --   --   --   HGB 15.1 15.4  < > 14.9 15.6 14.5 13.9  HCT 45.8 45.9  < > 45.6 46.8 46.3 46.2  MCV 85.1 88.4  < > 93.4 88.5 92.4 94.1  PLT 156 167  < > 177 234 185 181  < > = values in this interval not displayed. Lab Results  Component Value Date   TSH 3.881 09/13/2013   Lab Results  Component Value Date   HGBA1C 5.7 (H) 03/28/2016   Lab Results  Component Value Date   CHOL 127 01/17/2016   HDL 25 (L) 01/17/2016   LDLCALC 67 01/17/2016   TRIG 177 (H) 01/17/2016   CHOLHDL 5.1 01/17/2016    Significant Diagnostic Results in last 30 days:  No results found.  Assessment/Plan  :   #1-hypertension this appears to be under better control on the clonidine lisinopril and Lopressor at this point will continue monitor occasional systolics spikes but I suspect this is more to activity and smoking related-again patient prefers to keep smoking despite advisement to try cessation  .  #2 history of COPD bronchitis this appears stable he continues on Advair does not complain of increased shortness of breath or increasing cough.  #3 history of peripheral vascular disease this is followed again by vascular physician as noted above-he does continue on aspirin as well as Plavix.  #4-history coronary  artery disease-this appears to be stable he is on an Ace as well as anticoagulation  with aspirin and Plavix continues on a statin as well -he is also on Imdur.  Lipid panel back I June 2017 showed a LDL of 67-goal is to keep LDL less than 70 will monitor at periodic intervals total cholesterol is 127  #5-history of right shoulder pain- WITH degenerative joint disease he is on oxycodone 15 mg every 4 hours which he takes frequently .At this point will monitor this was with discussed with Dr. Nyra Market continues to ambulate in his wheelchair appears to be doing well with this but will have to monitor pain although he appears to be doing well with his ADLs and activities including going outside frequently  #6-history of lower extremity edema with history of ischemic cardiomyopathy again he is on Lasix 40 mg 3 times a day and 20 mg other days-this appears to be relatively stable weight appears stable at 218  #7 renal insufficiency creatinine most recently 1.82 on lab done in early September this appears baseline with August lab as well previous baseline of been more in the mid ones we will update this.  #8-history gout usually this occurs in his foot-but has been stable now for some time he is on colchicine   I #9-does have a history of gastric ulce--GI bleed--r he is on a PPI this is been asymptomatic--will update a CBC to ensure stability  #10 history of parathyroidectomy again patient has deferred follow-up nonetheless he appears stable in this regard with no complaints  [id number]-of note greater than 35 minutes spent assessing patient-discussing his status with nursing staff-reviewing his chart-his labs-consultation notes-and coordinating formulating plan of care for numerous diagnoses-of note greater than 50% of time spent coordinating plan of care    .  [id number]

## 2016-05-09 ENCOUNTER — Encounter (HOSPITAL_COMMUNITY)
Admission: RE | Admit: 2016-05-09 | Discharge: 2016-05-09 | Disposition: A | Discharge status: Home / Self Care | Payer: Medicare Other | Source: Skilled Nursing Facility | Attending: Internal Medicine | Admitting: Internal Medicine

## 2016-05-09 DIAGNOSIS — J449 Chronic obstructive pulmonary disease, unspecified: Secondary | ICD-10-CM | POA: Diagnosis not present

## 2016-05-09 DIAGNOSIS — Z79899 Other long term (current) drug therapy: Secondary | ICD-10-CM | POA: Diagnosis not present

## 2016-05-09 DIAGNOSIS — Z89511 Acquired absence of right leg below knee: Secondary | ICD-10-CM | POA: Diagnosis not present

## 2016-05-09 DIAGNOSIS — D649 Anemia, unspecified: Secondary | ICD-10-CM | POA: Insufficient documentation

## 2016-05-09 LAB — CBC
HCT: 43.4 % (ref 39.0–52.0)
Hemoglobin: 14.3 g/dL (ref 13.0–17.0)
MCH: 30 pg (ref 26.0–34.0)
MCHC: 32.9 g/dL (ref 30.0–36.0)
MCV: 91 fL (ref 78.0–100.0)
Platelets: 179 10*3/uL (ref 150–400)
RBC: 4.77 MIL/uL (ref 4.22–5.81)
RDW: 15 % (ref 11.5–15.5)
WBC: 6.5 10*3/uL (ref 4.0–10.5)

## 2016-05-09 LAB — BASIC METABOLIC PANEL
Anion gap: 8 (ref 5–15)
BUN: 25 mg/dL — ABNORMAL HIGH (ref 6–20)
CO2: 30 mmol/L (ref 22–32)
CREATININE: 1.72 mg/dL — AB (ref 0.61–1.24)
Calcium: 8.7 mg/dL — ABNORMAL LOW (ref 8.9–10.3)
Chloride: 93 mmol/L — ABNORMAL LOW (ref 101–111)
GFR, EST AFRICAN AMERICAN: 48 mL/min — AB (ref >60)
GFR, EST NON AFRICAN AMERICAN: 42 mL/min — AB (ref >60)
Glucose, Bld: 123 mg/dL — ABNORMAL HIGH (ref 65–99)
Potassium: 4.2 mmol/L (ref 3.5–5.1)
SODIUM: 131 mmol/L — AB (ref 135–145)

## 2016-05-09 IMAGING — NM NM MISC PROCEDURE
3 series · 18 of 18 positions shown · non-contrast
Comparison: none

[Series 1: wbr_s-proj_st stress_(id)_sa · 6.5mm · 6.51mm/px · 6 of 512 frames shown (1 of 2)]
[frame 43/512]
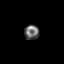
[frame 128/512]
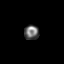
[frame 214/512]
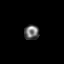
[frame 299/512]
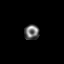
[frame 384/512]
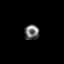
[frame 470/512]
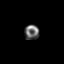

[Series 1: wbr_r-proj_st rest_(id)_sa · 6.5mm · 6.51mm/px · 6 of 64 frames shown]
[frame 6/64]
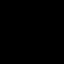
[frame 16/64]
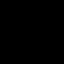
[frame 27/64]
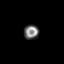
[frame 38/64]
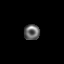
[frame 48/64]
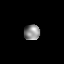
[frame 59/64]
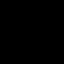

[Series 1: wbr_s-proj_st stress_(id)_sa · 6.5mm · 6.51mm/px · 6 of 64 frames shown (2 of 2)]
[frame 6/64]
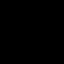
[frame 16/64]
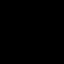
[frame 27/64]
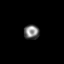
[frame 38/64]
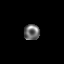
[frame 48/64]
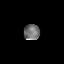
[frame 59/64]
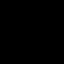

[18 of 18 positions shown; findings below may reference images not displayed]

Canned report from images found in remote index.

Refer to host system for actual result text.

## 2016-05-20 ENCOUNTER — Encounter (HOSPITAL_COMMUNITY)
Admission: AD | Admit: 2016-05-20 | Discharge: 2016-05-20 | Disposition: A | Discharge status: Home / Self Care | Payer: Medicare Other | Source: Skilled Nursing Facility

## 2016-05-20 ENCOUNTER — Encounter: Payer: Self-pay | Admitting: Vascular Surgery

## 2016-05-20 DIAGNOSIS — J449 Chronic obstructive pulmonary disease, unspecified: Secondary | ICD-10-CM | POA: Diagnosis not present

## 2016-05-20 DIAGNOSIS — Z89511 Acquired absence of right leg below knee: Secondary | ICD-10-CM | POA: Diagnosis not present

## 2016-05-20 DIAGNOSIS — D649 Anemia, unspecified: Secondary | ICD-10-CM | POA: Diagnosis not present

## 2016-05-20 DIAGNOSIS — Z79899 Other long term (current) drug therapy: Secondary | ICD-10-CM | POA: Diagnosis not present

## 2016-05-20 LAB — URINALYSIS, ROUTINE W REFLEX MICROSCOPIC
BILIRUBIN URINE: NEGATIVE
Glucose, UA: NEGATIVE mg/dL
Hgb urine dipstick: NEGATIVE
Ketones, ur: NEGATIVE mg/dL
Leukocytes, UA: NEGATIVE
NITRITE: POSITIVE — AB
Protein, ur: NEGATIVE mg/dL
Specific Gravity, Urine: <1.005 — ABNORMAL LOW (ref 1.005–1.030)
pH: 6 (ref 5.0–8.0)

## 2016-05-20 LAB — URINE MICROSCOPIC-ADD ON
SQUAMOUS EPITHELIAL / LPF: NONE SEEN
WBC UA: NONE SEEN WBC/hpf (ref 0–5)

## 2016-05-21 ENCOUNTER — Other Ambulatory Visit: Payer: Self-pay | Admitting: *Deleted

## 2016-05-21 MED ORDER — OXYCODONE HCL 15 MG PO TABS: ORAL_TABLET | ORAL | 0 refills | Status: DC

## 2016-05-21 NOTE — Telephone Encounter (Signed)
[facility] Nursing #[phone] Fax: [phone]   

## 2016-05-22 ENCOUNTER — Encounter: Payer: Self-pay | Admitting: Vascular Surgery

## 2016-05-22 ENCOUNTER — Ambulatory Visit (HOSPITAL_COMMUNITY)
Admission: RE | Admit: 2016-05-22 | Discharge: 2016-05-22 | Disposition: A | Discharge status: Home / Self Care | Payer: Medicare Other | Source: Ambulatory Visit | Attending: Vascular Surgery | Admitting: Vascular Surgery

## 2016-05-22 ENCOUNTER — Ambulatory Visit (INDEPENDENT_AMBULATORY_CARE_PROVIDER_SITE_OTHER): Payer: Medicare Other | Admitting: Vascular Surgery

## 2016-05-22 VITALS — BP 113/74 | HR 59 | Temp 98.9°F | Resp 18 | Ht 68.0 in | Wt 213.0 lb

## 2016-05-22 DIAGNOSIS — I255 Ischemic cardiomyopathy: Secondary | ICD-10-CM

## 2016-05-22 DIAGNOSIS — I739 Peripheral vascular disease, unspecified: Secondary | ICD-10-CM | POA: Diagnosis not present

## 2016-05-22 DIAGNOSIS — L97521 Non-pressure chronic ulcer of other part of left foot limited to breakdown of skin: Secondary | ICD-10-CM | POA: Diagnosis not present

## 2016-05-22 DIAGNOSIS — I6523 Occlusion and stenosis of bilateral carotid arteries: Secondary | ICD-10-CM | POA: Diagnosis not present

## 2016-05-22 DIAGNOSIS — R0989 Other specified symptoms and signs involving the circulatory and respiratory systems: Secondary | ICD-10-CM | POA: Diagnosis present

## 2016-05-22 LAB — URINE CULTURE
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
## 2016-05-30 ENCOUNTER — Non-Acute Institutional Stay (SKILLED_NURSING_FACILITY): Payer: Medicare Other | Admitting: Internal Medicine

## 2016-05-30 ENCOUNTER — Encounter: Payer: Self-pay | Admitting: Internal Medicine

## 2016-05-30 DIAGNOSIS — R238 Other skin changes: Secondary | ICD-10-CM | POA: Diagnosis not present

## 2016-05-30 DIAGNOSIS — I10 Essential (primary) hypertension: Secondary | ICD-10-CM

## 2016-05-30 DIAGNOSIS — N289 Disorder of kidney and ureter, unspecified: Secondary | ICD-10-CM

## 2016-05-30 NOTE — Progress Notes (Signed)
Location:   [place] Room Number: 107/W Place of Service:  SNF [phone]) Provider:  [place]an[place] Lewis  [name], MD  Patient Care Team: [name], MD as PCP - General (Internal Medicine)  Extended Emergency Contact Information Primary Emergency Contact: [place], [place] [address] [place] Mobile Phone: [phone] Relation: Daughter  Code Status:  Full Code Goals of care: Advanced Directive information Advanced Directives 05/30/2016  Does patient have an advance directive? Yes  Type of Advance Directive (No Data)  Does patient want to make changes to advanced directive? No - Patient declined  Copy of advanced directive(s) in chart? Yes  Would patient like information on creating an advanced directive? -  Pre-existing out of facility DNR order (yellow form or pink MOST form) -     Chief Complaint  Patient presents with  . Acute Visit    Blister on Left Leg    HPI:  Pt is a 60 y.o. male seen today for an acute visit for Some blisters on his left lower leg.  Patient does have a previous history of this and they appear to resolve spontaneously usually it's 1 or 2 blisters there appears to be a group this time.  They appear to be fluid filled they are not warm not tender at this point do not appear to be a cellulitic process  He does have a history of a left femoral endarterectomy with a history of peripheral arterial disease and previous right above-the-knee amputation.  He appears to be doing well in this regards he actually recently saw a vascular surgeon and thought to be doing well.       Past Medical History:  Diagnosis Date  . Anemia, unspecified   . Arthritis    "shoulders" (09/22/2014)  . Asthma   . Atrial fibrillation ([place])   . Carotid artery occlusion   . CHF (congestive heart failure) ([place])   . COPD 11/27/2007   Qualifier: Diagnosis of  By: Garen Grams    . Coronary artery disease    Cardiac catheterization in 2008 showed 99% stenosis in proximal left circumflex which was treated with Taxus drug-eluting stent. There was mild RCA and LAD disease with normal ejection fraction.  Marland Kitchen ERECTILE DYSFUNCTION 11/27/2007   Qualifier: Diagnosis of  By: Garen Grams    . Gastric ulcer, unspecified as acute or chronic, without mention of hemorrhage, perforation, or obstruction   . Heart murmur   . HYPERLIPIDEMIA 11/27/2007   Qualifier: Diagnosis of  By: Garen Grams    . Hypertension   . HYPERTENSION, BENIGN 05/23/2009   Qualifier: Diagnosis of  By: Melvyn Novas MD, Christena Deem   . Other severe protein-calorie malnutrition   . PAD (peripheral artery disease) (HCC)    Previous right SFA stent in 2003. Directional atherectomy right SFA in 01/2013  . Pneumonia 2000  . Pressure ulcer, lower back(707.03)   . Tobacco use   . Traumatic amputation of leg(s) (complete) (partial), unilateral, at or above knee, without mention of complication    Past Surgical History:  Procedure Laterality Date  . ABDOMINAL AORTAGRAM N/A 02/23/2013   Procedure: ABDOMINAL Maxcine Ham;  Surgeon: Wellington Hampshire, MD;  Location: [place] CATH LAB;  Service: Cardiovascular;  Laterality: N/A;  . ABDOMINAL AORTAGRAM N/A 09/22/2014   Procedure: ABDOMINAL Maxcine Ham;  Surgeon: Elam Dutch, MD;  Location: [facility] CATH LAB;  Service: Cardiovascular;  Laterality: N/A;  . ACNE CYST REMOVAL    .  AMPUTATION Right 09/09/2013   Procedure: AMPUTATION ABOVE KNEE;  Surgeon: Rosetta Posner, MD;  Location: [place];  Service: Vascular;  Laterality: Right;  . ATHERECTOMY N/A 03/02/2013   Procedure: ATHERECTOMY;  Surgeon: Wellington Hampshire, MD;  Location: [facility] CATH LAB;  Service: Cardiovascular;  Laterality: N/A;  . CARDIAC CATHETERIZATION N/A 09/03/2015   Procedure: Left Heart Cath and Coronary Angiography;  Surgeon: Leonie Man, MD;  Location: [place] CV LAB;  Service: Cardiovascular;  Laterality: N/A;  . CARDIAC CATHETERIZATION N/A 09/03/2015    Procedure: Coronary Balloon Angioplasty;  Surgeon: Leonie Man, MD;  Location: [place] CV LAB;  Service: Cardiovascular;  Laterality: N/A;  . CORONARY ANGIOPLASTY  09/03/2015  . ENDARTERECTOMY Left 08/16/2013   Procedure: Exploration of Left Neck/Fix Bleeding;  Surgeon: Elam Dutch, MD;  Location: [place];  Service: Vascular;  Laterality: Left;  . ENDARTERECTOMY FEMORAL Left 01/30/2016   Procedure: ENDARTERECTOMY LEFT FEMORAL ARTERY;  Surgeon: Elam Dutch, MD;  Location: [facility] OR;  Service: Vascular;  Laterality: Left;  . ESOPHAGOGASTRODUODENOSCOPY N/A 09/08/2013   Procedure: ESOPHAGOGASTRODUODENOSCOPY (EGD);  Surgeon: Cleotis Nipper, MD;  Location: [facility] ENDOSCOPY;  Service: Endoscopy;  Laterality: N/A;  . ESOPHAGOGASTRODUODENOSCOPY (EGD) WITH PROPOFOL N/A 01/05/2014   Procedure: ESOPHAGOGASTRODUODENOSCOPY (EGD) WITH PROPOFOL;  Surgeon: Cleotis Nipper, MD;  Location: [facility] ENDOSCOPY;  Service: Endoscopy;  Laterality: N/A;  . FEMORAL ARTERY STENT Right 2003   [name] 09/22/2014  . FLEXIBLE SIGMOIDOSCOPY N/A 09/08/2013   Procedure: FLEXIBLE SIGMOIDOSCOPY;  Surgeon: Cleotis Nipper, MD;  Location: [facility] ENDOSCOPY;  Service: Endoscopy;  Laterality: N/A;  unprepp  . ILIAC ARTERY STENT Left 09/22/2014  . NASAL SINUS SURGERY  2004  . PAROTIDECTOMY Right 03/29/2014   Procedure: PAROTIDECTOMY;  Surgeon: Ascencion Dike, MD;  Location: [place];  Service: ENT;  Laterality: Right;  . PATCH ANGIOPLASTY Left 01/30/2016   Procedure: LEFT FEMORAL ARTYERY PATCH ANGIOPLASTY USING HEMASHIELD PLATINUM FINESSE PATCH;  Surgeon: Elam Dutch, MD;  Location: [place];  Service: Vascular;  Laterality: Left;  . PERIPHERAL VASCULAR CATHETERIZATION N/A 08/17/2015   Procedure: Abdominal Aortogram;  Surgeon: Elam Dutch, MD;  Location: [place] CV LAB;  Service: Cardiovascular;  Laterality: N/A;    Allergies  Allergen Reactions  . Codeine Nausea Only      Medication List       Accurate as of 05/30/16  1:10 PM.  Always use your most recent med list.          aspirin EC 81 MG tablet Take 1 tablet (81 mg total) by mouth daily.   cloNIDine 0.2 MG tablet Commonly known as:  CATAPRES Take 0.2 mg by mouth 2 (two) times daily.   clopidogrel 75 MG tablet Commonly known as:  PLAVIX Take 75 mg by mouth daily. @@ 9:00 pm   colchicine 0.6 MG tablet Take 0.3 mg by mouth daily.   Fluticasone-Salmeterol 250-50 MCG/DOSE Aepb Commonly known as:  ADVAIR Inhale 1 puff into the lungs 2 (two) times daily.   furosemide 20 MG tablet Commonly known as:  LASIX Take 20 mg by mouth daily. Sunday, Tuesday, Thursday, Saturday   furosemide 40 MG tablet Commonly known as:  LASIX Take 40 mg by mouth daily. Mon,Wed,Fri   isosorbide mononitrate 30 MG 24 hr tablet Commonly known as:  IMDUR Take 1 tablet (30 mg total) by mouth daily.   lisinopril 40 MG tablet Commonly known as:  PRINIVIL,ZESTRIL Take 40 mg by mouth daily. Reported on 11/22/2015  metoprolol tartrate 25 MG tablet Commonly known as:  LOPRESSOR Take 1 tablet (25 mg total) by mouth 2 (two) times daily.   oxyCODONE 15 MG immediate release tablet Commonly known as:  ROXICODONE Take one tablet by mouth every 4 hours as needed for pain   pantoprazole 40 MG tablet Commonly known as:  PROTONIX Take 40 mg by mouth daily.   potassium chloride SA 20 MEQ tablet Commonly known as:  K-DUR,KLOR-CON Take 20 mEq by mouth daily.   simvastatin 40 MG tablet Commonly known as:  ZOCOR Take 40 mg by mouth daily.   temazepam 30 MG capsule Commonly known as:  RESTORIL Take one capsule by mouth at bedtime as needed for insomnia       Review of Systems    In general is not complaining any fever or chills.  Skin Blisters on lower left leg as noted above  Head ears eyes nose mouth and throat does not complaining of a nasal discharge visual changes sore throat.  Respiratory --hx  bronchitis COPD but is not complaining of any increased shortness of  breath or increased cough.  Cardiac not complaining of chest pain has chronic lower extremity edema on the left. This appears stable  GI does not complain of nausea vomiting diarrhea constipation or abdominal discomfort.  Musculoskeletal  Says he has somewhat chronic right shoulder pain   Neurologic is not complaining of dizziness headache or syncopal episodes.  Psych is not complaining of overt anxiety or depression appears to be doing well with his routine that he has established here    Immunization History  Administered Date(s) Administered  . DTP 05/28/2001  . Influenza-Unspecified 05/26/2013, 05/04/2014, 04/29/2016  . Pneumococcal-Unspecified 07/28/2009, 05/06/2016  . Tdap 12/30/2012   Pertinent  Health Maintenance Due  Topic Date Due  . COLONOSCOPY  11/22/2016 (Originally 11/24/2006)  . INFLUENZA VACCINE  Completed   Fall Risk  12/30/2012  Falls in the past year? No   Functional Status Survey:      Physical Exam He is afebrile pulse is 60 respirations 18 blood pressures recently 144/85-124/72 continues to have some variability but I do not see consistent elevations  In general this is a well-nourished middle-aged male in no distress sitting comfortably in his wheelchair  His skin is warm and dry does have pattern alopecia  On the back side of his lower left leg there are several fluid filled blisters these do not appear to be overtly erythematous cellulitic-there is not really any tenderness to palpation or significant warmth.   also has significant sun exposure mainly face and arms   Chest he does have some diffuse rhonchi which appears to be baseline there is no labored breathing.  Heart is regular rate and rhythm without murmur gallop or rub he has baseline  edema of his left lower extremity  Continues to have venous stasis changes as well.  Abdomen is obese soft nontender with positive bowel sounds.  Musculoskeletal is status post right  AKA-moves all other extremities at baseline-ambulates in a wheelchair. Is able to push wheelchair with his arms bilaterally actually has significant strength upper extremities bilat  Neurologic is grossly intact no lateralizing findings her speech is clear.  Psych he is alert and oriented pleasant and appropriate.   Labs reviewed:  Recent Labs  03/12/16 0705 03/28/16 0715 05/09/16 0500  NA 134* 133* 131*  K 4.1 3.9 4.2  CL 100* 92* 93*  CO2 31 30 30   GLUCOSE 103* 109* 123*  BUN 28*  27* 25*  CREATININE 1.76* 1.82* 1.72*  CALCIUM 8.7* 9.0 8.7*    Recent Labs  08/29/15 1055 01/17/16 0145 01/30/16 0739  AST 19 21 18   ALT 15* 16* 12*  ALKPHOS 63 62 52  BILITOT 0.3 0.8 0.5  PROT 6.7 7.5 6.1*  ALBUMIN 3.8 4.1 3.4*    Recent Labs  07/11/15 0900  09/24/15 0300  01/30/16 0739 01/31/16 0530 05/09/16 0500  WBC 6.7  < > 7.7  < > 6.8 7.7 6.5  NEUTROABS 3.7  --  4.8  --   --   --   --   HGB 15.4  < > 14.9  < > 14.5 13.9 14.3  HCT 45.9  < > 45.6  < > 46.3 46.2 43.4  MCV 88.4  < > 93.4  < > 92.4 94.1 91.0  PLT 167  < > 177  < > 185 181 179  < > = values in this interval not displayed. Lab Results  Component Value Date   TSH 3.881 09/13/2013   Lab Results  Component Value Date   HGBA1C 5.7 (H) 03/28/2016   Lab Results  Component Value Date   CHOL 127 01/17/2016   HDL 25 (L) 01/17/2016   LDLCALC 67 01/17/2016   TRIG 177 (H) 01/17/2016   CHOLHDL 5.1 01/17/2016    Significant Diagnostic Results in last 30 days:  No results found.  Assessment/Plan  #1 lower extremity blisters-it appears these have come and gone in the past however he has several this time-at this point does not really give a cellulitic appearance-will start Betadine topical daily to see if this will help with drying these out continue to monitor for any changes that might indicate to progressing situation cellulitis etc. Such as increased warmth tenderness or erythema  #2 history renal  insufficiency recent creatinine of 1.72 BUN of 25 appears to be his recent baseline Will update this to ensure stability.  HTN--as noted above some variation but no consistent elevations...will monitor for now  Continue clonidine-lisinopril-lopressor  [place], [place], [place]

## 2016-06-02 ENCOUNTER — Encounter (HOSPITAL_COMMUNITY)
Admission: RE | Admit: 2016-06-02 | Discharge: 2016-06-02 | Disposition: A | Discharge status: Home / Self Care | Payer: Medicare Other | Source: Skilled Nursing Facility | Attending: Internal Medicine | Admitting: Internal Medicine

## 2016-06-02 DIAGNOSIS — D649 Anemia, unspecified: Secondary | ICD-10-CM | POA: Insufficient documentation

## 2016-06-02 DIAGNOSIS — Z89511 Acquired absence of right leg below knee: Secondary | ICD-10-CM | POA: Diagnosis not present

## 2016-06-02 DIAGNOSIS — J449 Chronic obstructive pulmonary disease, unspecified: Secondary | ICD-10-CM | POA: Insufficient documentation

## 2016-06-02 DIAGNOSIS — G4700 Insomnia, unspecified: Secondary | ICD-10-CM | POA: Diagnosis not present

## 2016-06-02 DIAGNOSIS — K259 Gastric ulcer, unspecified as acute or chronic, without hemorrhage or perforation: Secondary | ICD-10-CM | POA: Diagnosis not present

## 2016-06-02 LAB — BASIC METABOLIC PANEL
Anion gap: 9 (ref 5–15)
BUN: 31 mg/dL — AB (ref 6–20)
CHLORIDE: 96 mmol/L — AB (ref 101–111)
CO2: 30 mmol/L (ref 22–32)
CREATININE: 1.71 mg/dL — AB (ref 0.61–1.24)
Calcium: 8.8 mg/dL — ABNORMAL LOW (ref 8.9–10.3)
GFR, EST AFRICAN AMERICAN: 49 mL/min — AB (ref >60)
GFR, EST NON AFRICAN AMERICAN: 42 mL/min — AB (ref >60)
Glucose, Bld: 120 mg/dL — ABNORMAL HIGH (ref 65–99)
POTASSIUM: 3.9 mmol/L (ref 3.5–5.1)
SODIUM: 135 mmol/L (ref 135–145)

## 2016-06-04 ENCOUNTER — Other Ambulatory Visit: Payer: Self-pay | Admitting: *Deleted

## 2016-06-04 MED ORDER — TEMAZEPAM 30 MG PO CAPS: ORAL_CAPSULE | ORAL | 5 refills | Status: DC

## 2016-06-04 NOTE — Telephone Encounter (Signed)
[facility] Nursing #[phone] Fax: [phone]   

## 2016-06-10 NOTE — Addendum Note (Signed)
Addended by: Thresa Ross C on: 06/10/2016 02:00 PM   Modules accepted: Orders

## 2016-06-17 ENCOUNTER — Other Ambulatory Visit: Payer: Self-pay

## 2016-06-17 MED ORDER — OXYCODONE HCL 15 MG PO TABS: ORAL_TABLET | ORAL | 0 refills | Status: DC

## 2016-06-17 NOTE — Telephone Encounter (Signed)
RX faxed to [facility] @ [phone]. Phone number [phone]  

## 2016-06-30 ENCOUNTER — Non-Acute Institutional Stay (SKILLED_NURSING_FACILITY): Payer: Medicare Other | Admitting: Internal Medicine

## 2016-06-30 ENCOUNTER — Encounter: Payer: Self-pay | Admitting: Internal Medicine

## 2016-06-30 DIAGNOSIS — L03116 Cellulitis of left lower limb: Secondary | ICD-10-CM | POA: Diagnosis not present

## 2016-06-30 DIAGNOSIS — I739 Peripheral vascular disease, unspecified: Secondary | ICD-10-CM

## 2016-06-30 DIAGNOSIS — I10 Essential (primary) hypertension: Secondary | ICD-10-CM

## 2016-06-30 DIAGNOSIS — J42 Unspecified chronic bronchitis: Secondary | ICD-10-CM

## 2016-06-30 DIAGNOSIS — I5032 Chronic diastolic (congestive) heart failure: Secondary | ICD-10-CM | POA: Diagnosis not present

## 2016-06-30 NOTE — Progress Notes (Signed)
Location:   [place] Room Number: 107/W Place of Service:  SNF [phone]) Provider:  Clydene Fake, MD  Patient Care Team: [name], MD as PCP - General (Internal Medicine)  Extended Emergency Contact Information Primary Emergency Contact: [place], [place] [address] [place] Mobile Phone: [phone] Relation: Daughter  Code Status: Full Code Goals of care: Advanced Directive information Advanced Directives 06/30/2016  Does Patient Have a Medical Advance Directive? Yes  Type of Advance Directive (No Data)  Does patient want to make changes to medical advance directive? No - Patient declined  Copy of [place] in Chart? -  Would patient like information on creating a medical advance directive? -  Pre-existing out of facility DNR order (yellow form or pink MOST form) -     Chief Complaint  Patient presents with  . Medical Management of Chronic Issues    Routine Visit  . Acute Visit    Skin rash and blisters.    HPI:  Pt is a 60 y.o. male seen today for medical management of chronic diseases.   Patient has H/O Severe Peripheral Artery disease,S/P Right AKA. S/P Left femoral Endarterectomy 02/09/16    , Hypertension , CAD s/p PTCA, CHF, Hyperlipidemia, COPD, Continuous to smoke, Asymptomatic Carotis stenosis. And Arthritis on Chronic Narcotics.  Patient was seen today for routine visit. Patient was c/o rash and blisters in his Left Lower leg. He says that for past few months he keeps breaking out in this rash with blisters in his lower leg. He denies any pain but increased redness in the leg. No fever or chills. His blisters do respond to steroid cream but then he breaks out again and they drain. No Itching.  Otherwise patient is doing well. He saw his Vascular surgeon for follow up and his ABI showed mild occlusive disease. He also has cough but no productive sputum. He continues to  smoke and refuses to quit. His weight is stable at 215.8 Lbs.   Past Medical History:  Diagnosis Date  . Anemia, unspecified   . Arthritis    "shoulders" (09/22/2014)  . Asthma   . Atrial fibrillation ([place])   . Carotid artery occlusion   . CHF (congestive heart failure) ([place])   . COPD 11/27/2007   Qualifier: Diagnosis of  By: Garen Grams    . Coronary artery disease    Cardiac catheterization in 2008 showed 99% stenosis in proximal left circumflex which was treated with Taxus drug-eluting stent. There was mild RCA and LAD disease with normal ejection fraction.  Marland Kitchen ERECTILE DYSFUNCTION 11/27/2007   Qualifier: Diagnosis of  By: Garen Grams    . Gastric ulcer, unspecified as acute or chronic, without mention of hemorrhage, perforation, or obstruction   . Heart murmur   . HYPERLIPIDEMIA 11/27/2007   Qualifier: Diagnosis of  By: Garen Grams    . Hypertension   . HYPERTENSION, BENIGN 05/23/2009   Qualifier: Diagnosis of  By: Melvyn Novas MD, Christena Deem   . Other severe protein-calorie malnutrition   . PAD (peripheral artery disease) (HCC)    Previous right SFA stent in 2003. Directional atherectomy right SFA in 01/2013  . Pneumonia 2000  . Pressure ulcer, lower back(707.03)   . Tobacco use   . Traumatic amputation of leg(s) (complete) (partial), unilateral, at or above knee, without mention of complication    Past Surgical History:  Procedure Laterality Date  .  ABDOMINAL AORTAGRAM N/A 02/23/2013   Procedure: ABDOMINAL Maxcine Ham;  Surgeon: Wellington Hampshire, MD;  Location: [place] CATH LAB;  Service: Cardiovascular;  Laterality: N/A;  . ABDOMINAL AORTAGRAM N/A 09/22/2014   Procedure: ABDOMINAL Maxcine Ham;  Surgeon: Elam Dutch, MD;  Location: [facility] CATH LAB;  Service: Cardiovascular;  Laterality: N/A;  . ACNE CYST REMOVAL    . AMPUTATION Right 09/09/2013   Procedure: AMPUTATION ABOVE KNEE;  Surgeon: Rosetta Posner, MD;  Location: [place];  Service: Vascular;  Laterality: Right;  . ATHERECTOMY  N/A 03/02/2013   Procedure: ATHERECTOMY;  Surgeon: Wellington Hampshire, MD;  Location: [facility] CATH LAB;  Service: Cardiovascular;  Laterality: N/A;  . CARDIAC CATHETERIZATION N/A 09/03/2015   Procedure: Left Heart Cath and Coronary Angiography;  Surgeon: Leonie Man, MD;  Location: [place] CV LAB;  Service: Cardiovascular;  Laterality: N/A;  . CARDIAC CATHETERIZATION N/A 09/03/2015   Procedure: Coronary Balloon Angioplasty;  Surgeon: Leonie Man, MD;  Location: [place] CV LAB;  Service: Cardiovascular;  Laterality: N/A;  . CORONARY ANGIOPLASTY  09/03/2015  . ENDARTERECTOMY Left 08/16/2013   Procedure: Exploration of Left Neck/Fix Bleeding;  Surgeon: Elam Dutch, MD;  Location: [place];  Service: Vascular;  Laterality: Left;  . ENDARTERECTOMY FEMORAL Left 01/30/2016   Procedure: ENDARTERECTOMY LEFT FEMORAL ARTERY;  Surgeon: Elam Dutch, MD;  Location: [facility] OR;  Service: Vascular;  Laterality: Left;  . ESOPHAGOGASTRODUODENOSCOPY N/A 09/08/2013   Procedure: ESOPHAGOGASTRODUODENOSCOPY (EGD);  Surgeon: Cleotis Nipper, MD;  Location: [facility] ENDOSCOPY;  Service: Endoscopy;  Laterality: N/A;  . ESOPHAGOGASTRODUODENOSCOPY (EGD) WITH PROPOFOL N/A 01/05/2014   Procedure: ESOPHAGOGASTRODUODENOSCOPY (EGD) WITH PROPOFOL;  Surgeon: Cleotis Nipper, MD;  Location: [facility] ENDOSCOPY;  Service: Endoscopy;  Laterality: N/A;  . FEMORAL ARTERY STENT Right 2003   [name] 09/22/2014  . FLEXIBLE SIGMOIDOSCOPY N/A 09/08/2013   Procedure: FLEXIBLE SIGMOIDOSCOPY;  Surgeon: Cleotis Nipper, MD;  Location: [facility] ENDOSCOPY;  Service: Endoscopy;  Laterality: N/A;  unprepp  . ILIAC ARTERY STENT Left 09/22/2014  . NASAL SINUS SURGERY  2004  . PAROTIDECTOMY Right 03/29/2014   Procedure: PAROTIDECTOMY;  Surgeon: Ascencion Dike, MD;  Location: [place];  Service: ENT;  Laterality: Right;  . PATCH ANGIOPLASTY Left 01/30/2016   Procedure: LEFT FEMORAL ARTYERY PATCH ANGIOPLASTY USING HEMASHIELD PLATINUM FINESSE PATCH;  Surgeon: Elam Dutch, MD;   Location: [place];  Service: Vascular;  Laterality: Left;  . PERIPHERAL VASCULAR CATHETERIZATION N/A 08/17/2015   Procedure: Abdominal Aortogram;  Surgeon: Elam Dutch, MD;  Location: [place] CV LAB;  Service: Cardiovascular;  Laterality: N/A;    Allergies  Allergen Reactions  . Codeine Nausea Only      Medication List       Accurate as of 06/30/16  3:06 PM. Always use your most recent med list.          aspirin EC 81 MG tablet Take 1 tablet (81 mg total) by mouth daily.   cloNIDine 0.2 MG tablet Commonly known as:  CATAPRES Take 0.2 mg by mouth 2 (two) times daily.   clopidogrel 75 MG tablet Commonly known as:  PLAVIX Take 75 mg by mouth daily. @@ 9:00 pm   colchicine 0.6 MG tablet Take 0.3 mg by mouth daily.   Fluticasone-Salmeterol 250-50 MCG/DOSE Aepb Commonly known as:  ADVAIR Inhale 1 puff into the lungs 2 (two) times daily.   furosemide 20 MG tablet Commonly known as:  LASIX Take 20 mg by mouth daily. Sunday, Tuesday, Thursday, Saturday  furosemide 40 MG tablet Commonly known as:  LASIX Take 40 mg by mouth daily. Mon,Wed,Fri   isosorbide mononitrate 30 MG 24 hr tablet Commonly known as:  IMDUR Take 1 tablet (30 mg total) by mouth daily.   lisinopril 40 MG tablet Commonly known as:  PRINIVIL,ZESTRIL Take 40 mg by mouth daily. Reported on 11/22/2015   metoprolol tartrate 25 MG tablet Commonly known as:  LOPRESSOR Take 1 tablet (25 mg total) by mouth 2 (two) times daily.   oxyCODONE 15 MG immediate release tablet Commonly known as:  ROXICODONE Take one tablet by mouth every 4 hours as needed for pain   pantoprazole 40 MG tablet Commonly known as:  PROTONIX Take 40 mg by mouth daily.   potassium chloride SA 20 MEQ tablet Commonly known as:  K-DUR,KLOR-CON Take 20 mEq by mouth daily.   simvastatin 40 MG tablet Commonly known as:  ZOCOR Take 40 mg by mouth daily.   temazepam 30 MG capsule Commonly known as:  RESTORIL Take one capsule  by mouth at bedtime as needed for insomnia       Review of Systems  Constitutional: Negative for activity change, appetite change, chills, fatigue and fever.  HENT: Negative for postnasal drip, rhinorrhea, sinus pain, sinus pressure and sore throat.   Respiratory: Positive for cough and wheezing. Negative for apnea, chest tightness and shortness of breath.   Cardiovascular: Positive for leg swelling. Negative for chest pain and palpitations.  Gastrointestinal: Negative for abdominal distention, abdominal pain, constipation, diarrhea and nausea.  Neurological: Negative for dizziness, weakness and light-headedness.  Psychiatric/Behavioral: Negative for agitation and decreased concentration.    Immunization History  Administered Date(s) Administered  . DTP 05/28/2001  . Influenza-Unspecified 05/26/2013, 05/04/2014, 04/29/2016  . Pneumococcal-Unspecified 07/28/2009, 05/06/2016  . Tdap 12/30/2012   Pertinent  Health Maintenance Due  Topic Date Due  . COLONOSCOPY  11/22/2016 (Originally 11/24/2006)  . INFLUENZA VACCINE  Completed   Fall Risk  12/30/2012  Falls in the past year? No   Functional Status Survey:    Vitals:   06/30/16 1458  BP: (!) 162/83 repeated manually and it was 170/100  Pulse: (!) 55  Resp: 20  Temp: 98.1 F (36.7 C)  TempSrc: Oral  SpO2: 100%   There is no height or weight on file to calculate BMI. Physical Exam  Constitutional: He is oriented to person, place, and time. He appears well-developed and well-nourished.  HENT:  Head: Normocephalic.  Mouth/Throat: Oropharynx is clear and moist.  Cardiovascular: Normal rate, regular rhythm and normal heart sounds.   Pulmonary/Chest: Effort normal.  Patient has B/L Expiratory Wheezing.  Abdominal: Soft. Bowel sounds are normal. He exhibits no distension. There is no tenderness.  Neurological: He is alert and oriented to person, place, and time.  Skin:  Left Lower leg has some closed blisters which are healing  but has redness around the skin which is warm.    Labs reviewed:  Recent Labs  03/28/16 0715 05/09/16 0500 06/02/16 0600  NA 133* 131* 135  K 3.9 4.2 3.9  CL 92* 93* 96*  CO2 30 30 30   GLUCOSE 109* 123* 120*  BUN 27* 25* 31*  CREATININE 1.82* 1.72* 1.71*  CALCIUM 9.0 8.7* 8.8*    Recent Labs  08/29/15 1055 01/17/16 0145 01/30/16 0739  AST 19 21 18   ALT 15* 16* 12*  ALKPHOS 63 62 52  BILITOT 0.3 0.8 0.5  PROT 6.7 7.5 6.1*  ALBUMIN 3.8 4.1 3.4*    Recent Labs  07/11/15 0900  09/24/15 0300  01/30/16 0739 01/31/16 0530 05/09/16 0500  WBC 6.7  < > 7.7  < > 6.8 7.7 6.5  NEUTROABS 3.7  --  4.8  --   --   --   --   HGB 15.4  < > 14.9  < > 14.5 13.9 14.3  HCT 45.9  < > 45.6  < > 46.3 46.2 43.4  MCV 88.4  < > 93.4  < > 92.4 94.1 91.0  PLT 167  < > 177  < > 185 181 179  < > = values in this interval not displayed. Lab Results  Component Value Date   TSH 3.881 09/13/2013   Lab Results  Component Value Date   HGBA1C 5.7 (H) 03/28/2016   Lab Results  Component Value Date   CHOL 127 01/17/2016   HDL 25 (L) 01/17/2016   LDLCALC 67 01/17/2016   TRIG 177 (H) 01/17/2016   CHOLHDL 5.1 01/17/2016    Significant Diagnostic Results in last 30 days:  No results found.  Assessment/Plan  Cellulitis of left lower extremity Will redness around the blisters will start him on Keflex . Discontinue steroid cream. Follow up. For his blisters he will need dermatologist consult.  PAD (peripheral artery disease)   Continue on Aspirin and Plavix. Also on statin with LDL of 67.  Essential hypertension BP today manually was high but patient was little stressed out due to his rash. But usually his BP has been well controlled. Will keep him on same medicines. Continue to monitor.Patient on Lisinopril , Metoprolol, and lasix and clonidine.  Chronic diastolic congestive heart failure  Patient stable on Lasix. His Echo in 02/15 was 55 %  Chronic bronchitis Patient continues  to smoke and has chronic cough with wheezing. He refuses for any nebs. He said I am fine and do not want any more meds.  Chronic Kidney disease.Stage 3 Creat stable at 1.71  Spent more then 35 min in assessment.  Family/ staff Communication:  Labs/tests ordered:

## 2016-07-15 ENCOUNTER — Other Ambulatory Visit (HOSPITAL_COMMUNITY)
Admission: RE | Admit: 2016-07-15 | Discharge: 2016-07-15 | Disposition: A | Discharge status: Home / Self Care | Payer: Medicare Other | Source: Skilled Nursing Facility | Attending: Internal Medicine | Admitting: Internal Medicine

## 2016-07-15 DIAGNOSIS — S81802A Unspecified open wound, left lower leg, initial encounter: Secondary | ICD-10-CM | POA: Insufficient documentation

## 2016-07-15 DIAGNOSIS — X58XXXA Exposure to other specified factors, initial encounter: Secondary | ICD-10-CM | POA: Insufficient documentation

## 2016-07-18 ENCOUNTER — Non-Acute Institutional Stay (SKILLED_NURSING_FACILITY): Payer: Medicare Other | Admitting: Internal Medicine

## 2016-07-18 DIAGNOSIS — N289 Disorder of kidney and ureter, unspecified: Secondary | ICD-10-CM

## 2016-07-18 DIAGNOSIS — L03116 Cellulitis of left lower limb: Secondary | ICD-10-CM

## 2016-07-18 LAB — AEROBIC CULTURE W GRAM STAIN (SUPERFICIAL SPECIMEN)

## 2016-07-18 LAB — AEROBIC CULTURE  (SUPERFICIAL SPECIMEN)

## 2016-07-22 ENCOUNTER — Encounter (HOSPITAL_COMMUNITY)
Admission: RE | Admit: 2016-07-22 | Discharge: 2016-07-22 | Disposition: A | Discharge status: Home / Self Care | Payer: Medicare Other | Source: Skilled Nursing Facility | Attending: Internal Medicine | Admitting: Internal Medicine

## 2016-07-22 DIAGNOSIS — J449 Chronic obstructive pulmonary disease, unspecified: Secondary | ICD-10-CM | POA: Insufficient documentation

## 2016-07-22 DIAGNOSIS — K259 Gastric ulcer, unspecified as acute or chronic, without hemorrhage or perforation: Secondary | ICD-10-CM | POA: Diagnosis not present

## 2016-07-22 DIAGNOSIS — D649 Anemia, unspecified: Secondary | ICD-10-CM | POA: Insufficient documentation

## 2016-07-22 DIAGNOSIS — I739 Peripheral vascular disease, unspecified: Secondary | ICD-10-CM | POA: Insufficient documentation

## 2016-07-22 DIAGNOSIS — G4700 Insomnia, unspecified: Secondary | ICD-10-CM | POA: Insufficient documentation

## 2016-07-22 DIAGNOSIS — Z89511 Acquired absence of right leg below knee: Secondary | ICD-10-CM | POA: Insufficient documentation

## 2016-07-22 LAB — BASIC METABOLIC PANEL
Anion gap: 9 (ref 5–15)
BUN: 33 mg/dL — AB (ref 6–20)
CALCIUM: 8.9 mg/dL (ref 8.9–10.3)
CHLORIDE: 96 mmol/L — AB (ref 101–111)
CO2: 30 mmol/L (ref 22–32)
CREATININE: 1.73 mg/dL — AB (ref 0.61–1.24)
GFR calc non Af Amer: 41 mL/min — ABNORMAL LOW (ref >60)
GFR, EST AFRICAN AMERICAN: 48 mL/min — AB (ref >60)
Glucose, Bld: 144 mg/dL — ABNORMAL HIGH (ref 65–99)
Potassium: 3.7 mmol/L (ref 3.5–5.1)
SODIUM: 135 mmol/L (ref 135–145)

## 2016-07-22 LAB — CBC WITH DIFFERENTIAL/PLATELET
BASOS PCT: 1 %
Basophils Absolute: 0.1 10*3/uL (ref 0.0–0.1)
EOS ABS: 0.2 10*3/uL (ref 0.0–0.7)
EOS PCT: 2 %
HCT: 46.6 % (ref 39.0–52.0)
HEMOGLOBIN: 15.1 g/dL (ref 13.0–17.0)
LYMPHS ABS: 1.8 10*3/uL (ref 0.7–4.0)
Lymphocytes Relative: 28 %
MCH: 29.4 pg (ref 26.0–34.0)
MCHC: 32.4 g/dL (ref 30.0–36.0)
MCV: 90.8 fL (ref 78.0–100.0)
MONO ABS: 0.5 10*3/uL (ref 0.1–1.0)
MONOS PCT: 8 %
Neutro Abs: 3.8 10*3/uL (ref 1.7–7.7)
Neutrophils Relative %: 61 %
PLATELETS: 154 10*3/uL (ref 150–400)
RBC: 5.13 MIL/uL (ref 4.22–5.81)
RDW: 14.5 % (ref 11.5–15.5)
WBC: 6.4 10*3/uL (ref 4.0–10.5)

## 2016-07-23 ENCOUNTER — Encounter: Payer: Self-pay | Admitting: Internal Medicine

## 2016-07-23 NOTE — Progress Notes (Signed)
This is an acute visit.  Level care skilled.  Facility [facility].  Chief complaint acute visit secondary to left leg blisters.  History of present illness.  Patient is a pleasant middle-aged male who has a history of rashes blisters in his left lower leg.  Denies any increased significant pain but is concerned about the blisters which every appeared.  Apparently they come and go and have responded to steroid cream in the past.  He was started on Keflex-but the blisters apparently are persisting-Dr. Lyndel Safe says he will need a dermatology consult and we have ordered one.  We were able to culture some slight drainage from one of the wound beds and it actually has grown out MRSA-  Past Medical History:  Diagnosis Date  . Anemia, unspecified   . Arthritis    "shoulders" (09/22/2014)  . Asthma   . Atrial fibrillation ([place])   . Carotid artery occlusion   . CHF (congestive heart failure) ([place])   . COPD 11/27/2007   Qualifier: Diagnosis of  By: Garen Grams    . Coronary artery disease    Cardiac catheterization in 2008 showed 99% stenosis in proximal left circumflex which was treated with Taxus drug-eluting stent. There was mild RCA and LAD disease with normal ejection fraction.  Marland Kitchen ERECTILE DYSFUNCTION 11/27/2007   Qualifier: Diagnosis of  By: Garen Grams    . Gastric ulcer, unspecified as acute or chronic, without mention of hemorrhage, perforation, or obstruction   . Heart murmur   . HYPERLIPIDEMIA 11/27/2007   Qualifier: Diagnosis of  By: Garen Grams    . Hypertension   . HYPERTENSION, BENIGN 05/23/2009   Qualifier: Diagnosis of  By: Melvyn Novas MD, Christena Deem   . Other severe protein-calorie malnutrition   . PAD (peripheral artery disease) (HCC)    Previous right SFA stent in 2003. Directional atherectomy right SFA in 01/2013  . Pneumonia 2000  . Pressure ulcer, lower back(707.03)   . Tobacco use   . Traumatic amputation of leg(s) (complete)  (partial), unilateral, at or above knee, without mention of complication         Past Surgical History:  Procedure Laterality Date  . ABDOMINAL AORTAGRAM N/A 02/23/2013   Procedure: ABDOMINAL Maxcine Ham;  Surgeon: Wellington Hampshire, MD;  Location: [facility] CATH LAB;  Service: Cardiovascular;  Laterality: N/A;  . ABDOMINAL AORTAGRAM N/A 09/22/2014   Procedure: ABDOMINAL Maxcine Ham;  Surgeon: Elam Dutch, MD;  Location: [facility] CATH LAB;  Service: Cardiovascular;  Laterality: N/A;  . ACNE CYST REMOVAL    . AMPUTATION Right 09/09/2013   Procedure: AMPUTATION ABOVE KNEE;  Surgeon: Rosetta Posner, MD;  Location: [place];  Service: Vascular;  Laterality: Right;  . ATHERECTOMY N/A 03/02/2013   Procedure: ATHERECTOMY;  Surgeon: Wellington Hampshire, MD;  Location: [facility] CATH LAB;  Service: Cardiovascular;  Laterality: N/A;  . CARDIAC CATHETERIZATION N/A 09/03/2015   Procedure: Left Heart Cath and Coronary Angiography;  Surgeon: Leonie Man, MD;  Location: [place] CV LAB;  Service: Cardiovascular;  Laterality: N/A;  . CARDIAC CATHETERIZATION N/A 09/03/2015   Procedure: Coronary Balloon Angioplasty;  Surgeon: Leonie Man, MD;  Location: [place] CV LAB;  Service: Cardiovascular;  Laterality: N/A;  . CORONARY ANGIOPLASTY  09/03/2015  . ENDARTERECTOMY Left 08/16/2013   Procedure: Exploration of Left Neck/Fix Bleeding;  Surgeon: Elam Dutch, MD;  Location: [place];  Service: Vascular;  Laterality: Left;  . ENDARTERECTOMY FEMORAL Left 01/30/2016   Procedure: ENDARTERECTOMY LEFT FEMORAL ARTERY;  Surgeon: Elam Dutch, MD;  Location: [place];  Service: Vascular;  Laterality: Left;  . ESOPHAGOGASTRODUODENOSCOPY N/A 09/08/2013   Procedure: ESOPHAGOGASTRODUODENOSCOPY (EGD);  Surgeon: Cleotis Nipper, MD;  Location: [facility] ENDOSCOPY;  Service: Endoscopy;  Laterality: N/A;  . ESOPHAGOGASTRODUODENOSCOPY (EGD) WITH PROPOFOL N/A 01/05/2014   Procedure: ESOPHAGOGASTRODUODENOSCOPY (EGD) WITH PROPOFOL;  Surgeon: Cleotis Nipper, MD;  Location: [facility] ENDOSCOPY;  Service: Endoscopy;  Laterality: N/A;  . FEMORAL ARTERY STENT Right 2003   [name] 09/22/2014  . FLEXIBLE SIGMOIDOSCOPY N/A 09/08/2013   Procedure: FLEXIBLE SIGMOIDOSCOPY;  Surgeon: Cleotis Nipper, MD;  Location: [facility] ENDOSCOPY;  Service: Endoscopy;  Laterality: N/A;  unprepp  . ILIAC ARTERY STENT Left 09/22/2014  . NASAL SINUS SURGERY  2004  . PAROTIDECTOMY Right 03/29/2014   Procedure: PAROTIDECTOMY;  Surgeon: Ascencion Dike, MD;  Location: [place];  Service: ENT;  Laterality: Right;  . PATCH ANGIOPLASTY Left 01/30/2016   Procedure: LEFT FEMORAL ARTYERY PATCH ANGIOPLASTY USING HEMASHIELD PLATINUM FINESSE PATCH;  Surgeon: Elam Dutch, MD;  Location: [place];  Service: Vascular;  Laterality: Left;  . PERIPHERAL VASCULAR CATHETERIZATION N/A 08/17/2015   Procedure: Abdominal Aortogram;  Surgeon: Elam Dutch, MD;  Location: [place] CV LAB;  Service: Cardiovascular;  Laterality: N/A;        Allergies  Allergen Reactions  . Codeine Nausea Only          Medication List           Accurate as of 06/30/16  3:06 PM. Always use your most recent med list.           aspirin EC 81 MG tablet Take 1 tablet (81 mg total) by mouth daily.  cloNIDine 0.2 MG tablet Commonly known as:  CATAPRES Take 0.2 mg by mouth 2 (two) times daily.  clopidogrel 75 MG tablet Commonly known as:  PLAVIX Take 75 mg by mouth daily. @@ 9:00 pm  colchicine 0.6 MG tablet Take 0.3 mg by mouth daily.  Fluticasone-Salmeterol 250-50 MCG/DOSE Aepb Commonly known as:  ADVAIR Inhale 1 puff into the lungs 2 (two) times daily.  furosemide 20 MG tablet Commonly known as:  LASIX Take 20 mg by mouth daily. Sunday, Tuesday, Thursday, Saturday  furosemide 40 MG tablet Commonly known as:  LASIX Take 40 mg by mouth daily. Mon,Wed,Fri  isosorbide mononitrate 30 MG 24 hr tablet Commonly known as:  IMDUR Take 1 tablet (30 mg total) by mouth daily.  lisinopril 40 MG  tablet Commonly known as:  PRINIVIL,ZESTRIL Take 40 mg by mouth daily. Reported on 11/22/2015  metoprolol tartrate 25 MG tablet Commonly known as:  LOPRESSOR Take 1 tablet (25 mg total) by mouth 2 (two) times daily.  oxyCODONE 15 MG immediate release tablet Commonly known as:  ROXICODONE Take one tablet by mouth every 4 hours as needed for pain  pantoprazole 40 MG tablet Commonly known as:  PROTONIX Take 40 mg by mouth daily.  potassium chloride SA 20 MEQ tablet Commonly known as:  K-DUR,KLOR-CON Take 20 mEq by mouth daily.  simvastatin 40 MG tablet Commonly known as:  ZOCOR Take 40 mg by mouth daily.  temazepam 30 MG capsule Commonly known as:  RESTORIL Take one capsule by mouth at bedtime as needed for insomnia      Review of Systems  Constitutional: Negative for activity change, appetite change, chills, fatigue and fever.   Skin-history of rash blisters left lower leg as noted above HENT: Negative for postnasal drip, rhinorrhea, sinus pain, sinus pressure  and sore throat.   Respiratory: Does not report any increased cough or wheezing beyond baseline he does continue to smoke. Negative for apnea, chest tightness and shortness of breath.   Cardiovascular: Positive for leg swelling. Negative for chest pain and palpitations.  Gastrointestinal: Negative for abdominal distention, abdominal pain, constipation, diarrhea and nausea.  Neurological: Negative for dizziness, weakness and light-headedness.  Psychiatric/Behavioral: Negative for agitation and decreased concentration.        Immunization History  Administered Date(s) Administered  . DTP 05/28/2001  . Influenza-Unspecified 05/26/2013, 05/04/2014, 04/29/2016  . Pneumococcal-Unspecified 07/28/2009, 05/06/2016  . Tdap 12/30/2012       Pertinent  Health Maintenance Due  Topic Date Due  . COLONOSCOPY  11/22/2016 (Originally 11/24/2006)  . INFLUENZA VACCINE  Completed   Fall Risk  12/30/2012  Falls in the past year?  No   Functional Status Survey:    He is afebrile pulse 80 respirations 20 blood pressure variable 130/62-160/90 recently has quite a bit of variation There is no height or weight on file to calculate BMI. Physical Exam  Constitutional: He is oriented to person, place, and time. He appears well-developed and well-nourished.  HENT:  Head: Normocephalic.  Mouth/Throat: Oropharynx is clear and moist.  Cardiovascular: Normal rate, regular rhythm and normal heart sounds.   Pulmonary/Chest: Effort normal.  Patient has B/L rhonchi which is baseline with previous exams Abdominal: Soft. Bowel sounds are normal. He exhibits no distension. There is no tenderness.  Neurological: He is alert and oriented to person, place, and time.  Skin:   left lower leg He does have some blisters with some mild erythema around them there is some slight warmth   scant drainage   Labs reviewed:  Recent Labs (within last 365 days)   Recent Labs  03/28/16 0715 05/09/16 0500 06/02/16 0600  NA 133* 131* 135  K 3.9 4.2 3.9  CL 92* 93* 96*  CO2 30 30 30   GLUCOSE 109* 123* 120*  BUN 27* 25* 31*  CREATININE 1.82* 1.72* 1.71*  CALCIUM 9.0 8.7* 8.8*      Recent Labs (within last 365 days)   Recent Labs  08/29/15 1055 01/17/16 0145 01/30/16 0739  AST 19 21 18   ALT 15* 16* 12*  ALKPHOS 63 62 52  BILITOT 0.3 0.8 0.5  PROT 6.7 7.5 6.1*  ALBUMIN 3.8 4.1 3.4*      Recent Labs (within last 365 days)   Recent Labs  07/11/15 0900  09/24/15 0300  01/30/16 0739 01/31/16 0530 05/09/16 0500  WBC 6.7  < > 7.7  < > 6.8 7.7 6.5  NEUTROABS 3.7  --  4.8  --   --   --   --   HGB 15.4  < > 14.9  < > 14.5 13.9 14.3  HCT 45.9  < > 45.6  < > 46.3 46.2 43.4  MCV 88.4  < > 93.4  < > 92.4 94.1 91.0  PLT 167  < > 177  < > 185 181 179  < > = values in this interval not displayed.   Recent Labs       Lab Results  Component Value Date   TSH 3.881 09/13/2013     Recent Labs       Lab  Results  Component Value Date   HGBA1C 5.7 (H) 03/28/2016     Recent Labs       Lab Results  Component Value Date   CHOL 127 01/17/2016   HDL 25 (L)  01/17/2016   LDLCALC 67 01/17/2016   TRIG 177 (H) 01/17/2016   CHOLHDL 5.1 01/17/2016      Significant Diagnostic Results in last 30 days:  Imaging Results   Assessment and plan.  Left lower extremity blisters with some drainage positive for MRSA-I have reviewed sensitivities-he does go out in the sun quite a bit so would be hesitant to use doxycycline-Will start clindamycin 300 mg every 6 hours for 10 days-monitor for any diarrhea also will start a probiotic twice a day for 14 days--this was discussed with Dr. group to be a phone-also will await dermatology follow-up.  #2 renal insufficiency baseline creatinine appears to be in the 1.7 area recently Will update this to ensure stability also will update a CBC next lab day to evaluate  for any  elevated white count  [id number]

## 2016-07-29 ENCOUNTER — Non-Acute Institutional Stay (SKILLED_NURSING_FACILITY): Payer: Medicare Other | Admitting: Internal Medicine

## 2016-07-29 ENCOUNTER — Encounter: Payer: Self-pay | Admitting: Internal Medicine

## 2016-07-29 DIAGNOSIS — L03116 Cellulitis of left lower limb: Secondary | ICD-10-CM | POA: Diagnosis not present

## 2016-07-29 NOTE — Progress Notes (Signed)
Location:   [facility] Room Number: 107/W Place of Service:  SNF [phone]) Provider:  [place] Lewis  [name], MD  Patient Care Team: [name], MD as PCP - General (Internal Medicine)  Extended Emergency Contact Information Primary Emergency Contact: [place], [place] [address] [place] Mobile Phone: [phone] Relation: Daughter  Code Status:  Full Code Goals of care: Advanced Directive information Advanced Directives 06/30/2016  Does Patient Have a Medical Advance Directive? Yes  Type of Advance Directive (No Data)  Does patient want to make changes to medical advance directive? No - Patient declined  Copy of [place] in Chart? -  Would patient like information on creating a medical advance directive? -  Pre-existing out of facility DNR order (yellow form or pink MOST form) -     Chief Complaint  Patient presents with  . Acute Visit    Blisters on left leg    HPI:  Pt is a 61 y.o. male seen today for an acute visit for Follow-up of blisters on his left leg. Patient does have a history of these apparently at times they come and go and responded to steroid cream in the past.  This is previously been assessed by Dr.Gupta and a dermatology consult has been ordered.  He did have some slight drainage we cultured it and it did grow out MRSA and he has completed a 10 day course of clindamycin.  The area does appear to be somewhat improved--he feels the clindamycin has helped-but he feels he would benefit from a longer course.  Staff is working on arranging a dermatology consult apparently there have been issues with getting this lined up quickly because of financial issues.       Past Medical History:  Diagnosis Date  . Anemia, unspecified   . Arthritis    "shoulders" (09/22/2014)  . Asthma   . Atrial fibrillation ([place])   . Carotid artery occlusion   . CHF (congestive heart failure)  ([place])   . COPD 11/27/2007   Qualifier: Diagnosis of  By: Garen Grams    . Coronary artery disease    Cardiac catheterization in 2008 showed 99% stenosis in proximal left circumflex which was treated with Taxus drug-eluting stent. There was mild RCA and LAD disease with normal ejection fraction.  Marland Kitchen ERECTILE DYSFUNCTION 11/27/2007   Qualifier: Diagnosis of  By: Garen Grams    . Gastric ulcer, unspecified as acute or chronic, without mention of hemorrhage, perforation, or obstruction   . Heart murmur   . HYPERLIPIDEMIA 11/27/2007   Qualifier: Diagnosis of  By: Garen Grams    . Hypertension   . HYPERTENSION, BENIGN 05/23/2009   Qualifier: Diagnosis of  By: Melvyn Novas MD, Christena Deem   . Other severe protein-calorie malnutrition   . PAD (peripheral artery disease) (HCC)    Previous right SFA stent in 2003. Directional atherectomy right SFA in 01/2013  . Pneumonia 2000  . Pressure ulcer, lower back(707.03)   . Tobacco use   . Traumatic amputation of leg(s) (complete) (partial), unilateral, at or above knee, without mention of complication    Past Surgical History:  Procedure Laterality Date  . ABDOMINAL AORTAGRAM N/A 02/23/2013   Procedure: ABDOMINAL Maxcine Ham;  Surgeon: Wellington Hampshire, MD;  Location: [place] CATH LAB;  Service: Cardiovascular;  Laterality: N/A;  . ABDOMINAL AORTAGRAM N/A 09/22/2014   Procedure: ABDOMINAL Maxcine Ham;  Surgeon: Elam Dutch,  MD;  Location: [place] CATH LAB;  Service: Cardiovascular;  Laterality: N/A;  . ACNE CYST REMOVAL    . AMPUTATION Right 09/09/2013   Procedure: AMPUTATION ABOVE KNEE;  Surgeon: Rosetta Posner, MD;  Location: [place];  Service: Vascular;  Laterality: Right;  . ATHERECTOMY N/A 03/02/2013   Procedure: ATHERECTOMY;  Surgeon: Wellington Hampshire, MD;  Location: [facility] CATH LAB;  Service: Cardiovascular;  Laterality: N/A;  . CARDIAC CATHETERIZATION N/A 09/03/2015   Procedure: Left Heart Cath and Coronary Angiography;  Surgeon: Leonie Man, MD;  Location: [place] CV LAB;  Service: Cardiovascular;  Laterality: N/A;  . CARDIAC CATHETERIZATION N/A 09/03/2015   Procedure: Coronary Balloon Angioplasty;  Surgeon: Leonie Man, MD;  Location: [place] CV LAB;  Service: Cardiovascular;  Laterality: N/A;  . CORONARY ANGIOPLASTY  09/03/2015  . ENDARTERECTOMY Left 08/16/2013   Procedure: Exploration of Left Neck/Fix Bleeding;  Surgeon: Elam Dutch, MD;  Location: [place];  Service: Vascular;  Laterality: Left;  . ENDARTERECTOMY FEMORAL Left 01/30/2016   Procedure: ENDARTERECTOMY LEFT FEMORAL ARTERY;  Surgeon: Elam Dutch, MD;  Location: [facility] OR;  Service: Vascular;  Laterality: Left;  . ESOPHAGOGASTRODUODENOSCOPY N/A 09/08/2013   Procedure: ESOPHAGOGASTRODUODENOSCOPY (EGD);  Surgeon: Cleotis Nipper, MD;  Location: [facility] ENDOSCOPY;  Service: Endoscopy;  Laterality: N/A;  . ESOPHAGOGASTRODUODENOSCOPY (EGD) WITH PROPOFOL N/A 01/05/2014   Procedure: ESOPHAGOGASTRODUODENOSCOPY (EGD) WITH PROPOFOL;  Surgeon: Cleotis Nipper, MD;  Location: [facility] ENDOSCOPY;  Service: Endoscopy;  Laterality: N/A;  . FEMORAL ARTERY STENT Right 2003   [name] 09/22/2014  . FLEXIBLE SIGMOIDOSCOPY N/A 09/08/2013   Procedure: FLEXIBLE SIGMOIDOSCOPY;  Surgeon: Cleotis Nipper, MD;  Location: [facility] ENDOSCOPY;  Service: Endoscopy;  Laterality: N/A;  unprepp  . ILIAC ARTERY STENT Left 09/22/2014  . NASAL SINUS SURGERY  2004  . PAROTIDECTOMY Right 03/29/2014   Procedure: PAROTIDECTOMY;  Surgeon: Ascencion Dike, MD;  Location: [place];  Service: ENT;  Laterality: Right;  . PATCH ANGIOPLASTY Left 01/30/2016   Procedure: LEFT FEMORAL ARTYERY PATCH ANGIOPLASTY USING HEMASHIELD PLATINUM FINESSE PATCH;  Surgeon: Elam Dutch, MD;  Location: [place];  Service: Vascular;  Laterality: Left;  . PERIPHERAL VASCULAR CATHETERIZATION N/A 08/17/2015   Procedure: Abdominal Aortogram;  Surgeon: Elam Dutch, MD;  Location: [place] CV LAB;  Service: Cardiovascular;  Laterality: N/A;    Allergies  Allergen  Reactions  . Codeine Nausea Only    Allergies as of 07/29/2016      Reactions   Codeine Nausea Only      Medication List       Accurate as of 07/29/16  4:16 PM. Always use your most recent med list.          aspirin EC 81 MG tablet Take 1 tablet (81 mg total) by mouth daily.   cloNIDine 0.2 MG tablet Commonly known as:  CATAPRES Take 0.2 mg by mouth 2 (two) times daily.   clopidogrel 75 MG tablet Commonly known as:  PLAVIX Take 75 mg by mouth daily. @@ 9:00 pm   colchicine 0.6 MG tablet Take 0.3 mg by mouth daily.   [employer] PROBIOTIC (LACTOBACILLUS) Caps Give 1 tablet by mouth twice a day until 08/01/16   Fluticasone-Salmeterol 250-50 MCG/DOSE Aepb Commonly known as:  ADVAIR Inhale 1 puff into the lungs 2 (two) times daily.   furosemide 20 MG tablet Commonly known as:  LASIX Take 20 mg by mouth daily. Sunday, Tuesday, Thursday, Saturday   furosemide 40 MG tablet Commonly known  as:  LASIX Take 40 mg by mouth daily. Mon,Wed,Fri   isosorbide mononitrate 30 MG 24 hr tablet Commonly known as:  IMDUR Take 1 tablet (30 mg total) by mouth daily.   lisinopril 40 MG tablet Commonly known as:  PRINIVIL,ZESTRIL Take 40 mg by mouth daily. Reported on 11/22/2015   metoprolol tartrate 25 MG tablet Commonly known as:  LOPRESSOR Take 1 tablet (25 mg total) by mouth 2 (two) times daily.   oxyCODONE 15 MG immediate release tablet Commonly known as:  ROXICODONE Take one tablet by mouth every 4 hours as needed for pain   pantoprazole 40 MG tablet Commonly known as:  PROTONIX Take 40 mg by mouth daily.   potassium chloride SA 20 MEQ tablet Commonly known as:  K-DUR,KLOR-CON Take 20 mEq by mouth daily.   simvastatin 40 MG tablet Commonly known as:  ZOCOR Take 40 mg by mouth daily.   temazepam 30 MG capsule Commonly known as:  RESTORIL Take one capsule by mouth at bedtime as needed for insomnia       Review of Systems   Constitutional: Negative for activity change,  appetite change, chills, fatigueand fever.   Skin-history of rash blisters left lower leg as noted above-these appear to be improving HENT: Negative for postnasal drip, rhinorrhea, sinus pain, sinus pressureand sore throat.  Respiratory: Does not report any increased cough or wheezing beyond baseline he does continue to smoke. Negative for apnea, chest tightnessand shortness of breath.  Cardiovascular: Positive for leg swelling. Negative for chest painand palpitations.  Gastrointestinal: Negative for abdominal distention, abdominal pain, constipation, diarrheaand nausea.  Neurological: Negative for dizziness, weaknessand light-headedness.  Psychiatric/Behavioral: Negative for agitationand decreased concentration.           Immunization History  Administered Date(s) Administered  . DTP 05/28/2001  . Influenza-Unspecified 05/26/2013, 05/04/2014, 04/29/2016  . Pneumococcal-Unspecified 07/28/2009, 05/06/2016  . Tdap 12/30/2012   Pertinent  Health Maintenance Due  Topic Date Due  . COLONOSCOPY  11/22/2016 (Originally 11/24/2006)  . INFLUENZA VACCINE  Completed   Fall Risk  12/30/2012  Falls in the past year? No   Functional Status Survey:    Vitals:   07/29/16 1600  BP: 120/68  Pulse: 62  Resp: 19  Temp: 98.5 F (36.9 C)  TempSrc: Oral  SpO2: 100%  Weight: 213 lb 6.4 oz (96.8 kg)  Height: 5\' 8"  (1.727 m)    Physical Exam   In general this is a well-nourished middle-aged male in no distress sitting comfortably in his wheelchair.  His skin is warm and dry-the blisters and mild erythema around them appear to be improved on the lower left leg-wound beds do have a small bit of moisture drainage-the areas were covered before assessment The blisters appear to be resolved although again as noted there are wound beds remaining--and some erythema.  Musculoskeletal he ambulates in a wheelchair this appears to be at baseline has good upper extremity strength-is status  post right  above the knee amputatio  Neurologic is grossly intact speech is clear.-No lateralizing findings continues to ambulate and go outside frequently in his wheelchair  Psych he is alert and oriented pleasant and appropriate n       Labs reviewed:  Recent Labs  05/09/16 0500 06/02/16 0600 07/22/16 0700  NA 131* 135 135  K 4.2 3.9 3.7  CL 93* 96* 96*  CO2 30 30 30   GLUCOSE 123* 120* 144*  BUN 25* 31* 33*  CREATININE 1.72* 1.71* 1.73*  CALCIUM 8.7* 8.8* 8.9  Recent Labs  08/29/15 1055 01/17/16 0145 01/30/16 0739  AST 19 21 18   ALT 15* 16* 12*  ALKPHOS 63 62 52  BILITOT 0.3 0.8 0.5  PROT 6.7 7.5 6.1*  ALBUMIN 3.8 4.1 3.4*    Recent Labs  09/24/15 0300  01/31/16 0530 05/09/16 0500 07/22/16 0700  WBC 7.7  < > 7.7 6.5 6.4  NEUTROABS 4.8  --   --   --  3.8  HGB 14.9  < > 13.9 14.3 15.1  HCT 45.6  < > 46.2 43.4 46.6  MCV 93.4  < > 94.1 91.0 90.8  PLT 177  < > 181 179 154  < > = values in this interval not displayed. Lab Results  Component Value Date   TSH 3.881 09/13/2013   Lab Results  Component Value Date   HGBA1C 5.7 (H) 03/28/2016   Lab Results  Component Value Date   CHOL 127 01/17/2016   HDL 25 (L) 01/17/2016   LDLCALC 67 01/17/2016   TRIG 177 (H) 01/17/2016   CHOLHDL 5.1 01/17/2016    Significant Diagnostic Results in last 30 days:  No results found.  Assessment/Plan  History of blisters with MRSA-this appears to be improved on the clindamycin will extend clindamycin for additional days to complete a 14 day course-also continues on a probiotic-dermatology consult is being arranged as noted above Blood work is unremarkable his white count is not elevated on labs done 07/22/2016 which is reassuring-renal function appears relatively stable with a creatinine of 1.73-BUN of [address]       Oralia Manis, [place]

## 2016-08-01 ENCOUNTER — Encounter: Payer: Self-pay | Admitting: Internal Medicine

## 2016-08-01 ENCOUNTER — Telehealth: Payer: Self-pay

## 2016-08-01 ENCOUNTER — Non-Acute Institutional Stay (SKILLED_NURSING_FACILITY): Payer: Medicare Other | Admitting: Internal Medicine

## 2016-08-01 DIAGNOSIS — L03116 Cellulitis of left lower limb: Secondary | ICD-10-CM | POA: Diagnosis not present

## 2016-08-01 DIAGNOSIS — I739 Peripheral vascular disease, unspecified: Secondary | ICD-10-CM

## 2016-08-01 DIAGNOSIS — M79605 Pain in left leg: Secondary | ICD-10-CM

## 2016-08-01 NOTE — Telephone Encounter (Signed)
Rec'd call from nurse at [facility].  Reported pt. c/o pain in left LE with "erythema and edema in the soft tissues."  Reported no open sores at this time. Reported erythema located below (L) knee, in posterior calf, and in the lower calf/ ankle area.  Stated the lower portion "appears like a cellulitis."   Reported the pt. was evaluated by the PA at [facility] and has been referred to Dermatology for Dermatitis.  Is also requesting evaluation of the circulation (L) LE, by Dr. Oneida Alar.  Advised will call back to [facility] with appt. information.

## 2016-08-01 NOTE — Telephone Encounter (Signed)
Pt on the schedule for 1/11 at 2 pm Juliann Pulse to scan and 3:15 with NP

## 2016-08-01 NOTE — Progress Notes (Signed)
Location:   [facility] Room Number: 107/W Place of Service:  SNF [phone]) Provider:  [place] Lewis  Edwin Dad, MD  Patient Care Team: Edwin Dad, MD as PCP - General (Internal Medicine)  Extended Emergency Contact Information Primary Emergency Contact: [place], [place] [address] [place] Mobile Phone: [phone] Relation: Daughter  Code Status:  Full Code  Goals of care: Advanced Directive information Advanced Directives 08/01/2016  Does Patient Have a Medical Advance Directive? Yes  Type of Advance Directive (No Data)  Does patient want to make changes to medical advance directive? No - Patient declined  Copy of [place] in Chart? -  Would patient like information on creating a medical advance directive? -  Pre-existing out of facility DNR order (yellow form or pink MOST form) -     Chief Complaint  Patient presents with  . Acute Visit     F/U on blisters on left leg    HPI:  Pt is a 61 y.o. [name] seen today for an acute visit for Follow-up of blisters on his left leg.  Patient does have a history of these apparently at times they come and go and responded to steroid cream in the past.  This is previously been assessed by Dr.Gupta and a dermatology consult has been ordered.  He did have some slight drainage we cultured it and it did grow out MRSA and he is completing a 14 day course  Blisters appear to be resolving however he still has some erythema and tenderness here--  He is afebrile does not complaining any fever or chills does complain of some soreness to the area however which is somewhat chronic     Past Medical History:  Diagnosis Date  . Anemia, unspecified   . Arthritis    "shoulders" (09/22/2014)  . Asthma   . Atrial fibrillation ([place])   . Carotid artery occlusion   . CHF (congestive heart failure) ([place])   . COPD 11/27/2007   Qualifier: Diagnosis of  By: Garen Grams    . Coronary artery disease    Cardiac catheterization in 2008 showed 99% stenosis in proximal left circumflex which was treated with Taxus drug-eluting stent. There was mild RCA and LAD disease with normal ejection fraction.  Marland Kitchen ERECTILE DYSFUNCTION 11/27/2007   Qualifier: Diagnosis of  By: Garen Grams    . Gastric ulcer, unspecified as acute or chronic, without mention of hemorrhage, perforation, or obstruction   . Heart murmur   . HYPERLIPIDEMIA 11/27/2007   Qualifier: Diagnosis of  By: Garen Grams    . Hypertension   . HYPERTENSION, BENIGN 05/23/2009   Qualifier: Diagnosis of  By: Melvyn Novas MD, Christena Deem   . Other severe protein-calorie malnutrition   . PAD (peripheral artery disease) (HCC)    Previous right SFA stent in 2003. Directional atherectomy right SFA in 01/2013  . Pneumonia 2000  . Pressure ulcer, lower back(707.03)   . Tobacco use   . Traumatic amputation of leg(s) (complete) (partial), unilateral, at or above knee, without mention of complication    Past Surgical History:  Procedure Laterality Date  . ABDOMINAL AORTAGRAM N/A 02/23/2013   Procedure: ABDOMINAL Maxcine Ham;  Surgeon: Wellington Hampshire, MD;  Location: [facility] CATH LAB;  Service: Cardiovascular;  Laterality: N/A;  . ABDOMINAL AORTAGRAM N/A 09/22/2014   Procedure: ABDOMINAL Maxcine Ham;  Surgeon: Elam Dutch, MD;  Location: [facility] CATH LAB;  Service: Cardiovascular;  Laterality: N/A;  . ACNE CYST REMOVAL    . AMPUTATION Right 09/09/2013   Procedure: AMPUTATION ABOVE KNEE;  Surgeon: Rosetta Posner, MD;  Location: [facility];  Service: Vascular;  Laterality: Right;  . ATHERECTOMY N/A 03/02/2013   Procedure: ATHERECTOMY;  Surgeon: Wellington Hampshire, MD;  Location: [facility] CATH LAB;  Service: Cardiovascular;  Laterality: N/A;  . CARDIAC CATHETERIZATION N/A 09/03/2015   Procedure: Left Heart Cath and Coronary Angiography;  Surgeon: Leonie Man, MD;  Location: [facility] CV LAB;  Service: Cardiovascular;  Laterality: N/A;  .  CARDIAC CATHETERIZATION N/A 09/03/2015   Procedure: Coronary Balloon Angioplasty;  Surgeon: Leonie Man, MD;  Location: [facility] CV LAB;  Service: Cardiovascular;  Laterality: N/A;  . CORONARY ANGIOPLASTY  09/03/2015  . ENDARTERECTOMY Left 08/16/2013   Procedure: Exploration of Left Neck/Fix Bleeding;  Surgeon: Elam Dutch, MD;  Location: [facility];  Service: Vascular;  Laterality: Left;  . ENDARTERECTOMY FEMORAL Left 01/30/2016   Procedure: ENDARTERECTOMY LEFT FEMORAL ARTERY;  Surgeon: Elam Dutch, MD;  Location: [facility] OR;  Service: Vascular;  Laterality: Left;  . ESOPHAGOGASTRODUODENOSCOPY N/A 09/08/2013   Procedure: ESOPHAGOGASTRODUODENOSCOPY (EGD);  Surgeon: Cleotis Nipper, MD;  Location: [facility] ENDOSCOPY;  Service: Endoscopy;  Laterality: N/A;  . ESOPHAGOGASTRODUODENOSCOPY (EGD) WITH PROPOFOL N/A 01/05/2014   Procedure: ESOPHAGOGASTRODUODENOSCOPY (EGD) WITH PROPOFOL;  Surgeon: Cleotis Nipper, MD;  Location: [facility] ENDOSCOPY;  Service: Endoscopy;  Laterality: N/A;  . FEMORAL ARTERY STENT Right 2003   [name] 09/22/2014  . FLEXIBLE SIGMOIDOSCOPY N/A 09/08/2013   Procedure: FLEXIBLE SIGMOIDOSCOPY;  Surgeon: Cleotis Nipper, MD;  Location[facility] ENDOSCOPY;  Service: Endoscopy;  Laterality: N/A;  unprepp  . ILIAC ARTERY STENT Left 09/22/2014  . NASAL SINUS SURGERY  2004  . PAROTIDECTOMY Right 03/29/2014   Procedure: PAROTIDECTOMY;  Surgeon: Ascencion Dike, MD;  Location: [facility];  Service: ENT;  Laterality: Right;  . PATCH ANGIOPLASTY Left 01/30/2016   Procedure: LEFT FEMORAL ARTYERY PATCH ANGIOPLASTY USING HEMASHIELD PLATINUM FINESSE PATCH;  Surgeon: Elam Dutch, MD;  Location: [facility];  Service: Vascular;  Laterality: Left;  . PERIPHERAL VASCULAR CATHETERIZATION N/A 08/17/2015   Procedure: Abdominal Aortogram;  Surgeon: Elam Dutch, MD;  Location: [facility] CV LAB;  Service: Cardiovascular;  Laterality: N/A;    Allergies  Allergen Reactions  . Codeine Nausea Only    Allergies as of 08/01/2016        Reactions   Codeine Nausea Only      Medication List       Accurate as of 08/01/16  3:55 PM. Always use your most recent med list.          aspirin EC 81 MG tablet Take 1 tablet (81 mg total) by mouth daily.   clindamycin 300 MG capsule Commonly known as:  CLEOCIN Take 300 mg by mouth every 6 (six) hours. Stop date 08/02/16   cloNIDine 0.2 MG tablet Commonly known as:  CATAPRES Take 0.2 mg by mouth 2 (two) times daily.   clopidogrel 75 MG tablet Commonly known as:  PLAVIX Take 75 mg by mouth daily. @@ 9:00 pm   colchicine 0.6 MG tablet Take 0.3 mg by mouth daily.   [employer] PROBIOTIC (LACTOBACILLUS) Caps Give 1 tablet by mouth twice a day until 08/01/16   Fluticasone-Salmeterol 250-50 MCG/DOSE Aepb Commonly known as:  ADVAIR Inhale 1 puff into the lungs 2 (two) times daily.   furosemide 20 MG tablet Commonly known as:  LASIX Take  20 mg by mouth daily. Sunday, Tuesday, Thursday, Saturday   furosemide 40 MG tablet Commonly known as:  LASIX Take 40 mg by mouth daily. Mon,Wed,Fri   isosorbide mononitrate 30 MG 24 hr tablet Commonly known as:  IMDUR Take 1 tablet (30 mg total) by mouth daily.   lisinopril 40 MG tablet Commonly known as:  PRINIVIL,ZESTRIL Take 40 mg by mouth daily. Reported on 11/22/2015   metoprolol tartrate 25 MG tablet Commonly known as:  LOPRESSOR Take 1 tablet (25 mg total) by mouth 2 (two) times daily.   oxyCODONE 15 MG immediate release tablet Commonly known as:  ROXICODONE Take one tablet by mouth every 4 hours as needed for pain   pantoprazole 40 MG tablet Commonly known as:  PROTONIX Take 40 mg by mouth daily.   potassium chloride SA 20 MEQ tablet Commonly known as:  K-DUR,KLOR-CON Take 20 mEq by mouth daily.   simvastatin 40 MG tablet Commonly known as:  ZOCOR Take 40 mg by mouth daily.   temazepam 30 MG capsule Commonly known as:  RESTORIL Take one capsule by mouth at bedtime as needed for insomnia       Review of Systems    Constitutional: Negative for activity change, appetite change, chills, fatigueand fever.   Skin-history of rash blisters left lower leg as noted above-has some erythema and soreness HENT: Negative for postnasal drip, rhinorrhea, sinus pain, sinus pressureand sore throat.  Respiratory: Does not report any increased cough or wheezing beyond baseline he does continue to smoke. Negative for apnea, chest tightnessand shortness of breath.  Cardiovascular: Positive for leg swelling. Negative for chest painand palpitations.  Gastrointestinal: Negative for abdominal distention, abdominal pain, constipation, diarrheaand nausea.  Neurological: Negative for dizziness, weaknessand light-headedness.  Psychiatric/Behavioral: Negative for agitationand decreased concentration.    Immunization History  Administered Date(s) Administered  . DTP 05/28/2001  . Influenza-Unspecified 05/26/2013, 05/04/2014, 04/29/2016  . Pneumococcal-Unspecified 07/28/2009, 05/06/2016  . Tdap 12/30/2012   Pertinent  Health Maintenance Due  Topic Date Due  . COLONOSCOPY  11/22/2016 (Originally 11/24/2006)  . INFLUENZA VACCINE  Completed   Fall Risk  12/30/2012  Falls in the past year? No   Functional Status Survey:    Vitals:   08/01/16 1547  BP: 121/70  Pulse: 66  Resp: (!) 24  Temp: 98.4 F (36.9 C)  TempSrc: Oral  SpO2: 95%    Physical Exam   In general this is a well-nourished middle-aged [name] in no distress sitting comfortably in his wheelchair.    His skin is warm and dry- Left lower leg area there continues to be some residual erythema and residual blister beds-I do not see any active drainage be still has however some warmth and erythema in these areas  Heart is regular rate and rhythm without murmur gallop or rub.  Chest he does have some diffuse rhonchi this is fairly baseline this clears somewhat with cough--there is no labored breathing-  Musculoskeletal he ambulates in a  wheelchair this appears to be at baseline has good upper extremity strength-is status post right  above the knee amputatio  Neurologic is grossly intact speech is clear.-No lateralizing findings continues to ambulate and go outside frequently in his wheelchair  Psych he is alert and oriented pleasant and appropriate n      Labs reviewed:  Recent Labs  05/09/16 0500 06/02/16 0600 07/22/16 0700  NA 131* 135 135  K 4.2 3.9 3.7  CL 93* 96* 96*  CO2 30 30 30   GLUCOSE 123*  120* 144*  BUN 25* 31* 33*  CREATININE 1.72* 1.71* 1.73*  CALCIUM 8.7* 8.8* 8.9    Recent Labs  08/29/15 1055 01/17/16 0145 01/30/16 0739  AST 19 21 18   ALT 15* 16* 12*  ALKPHOS 63 62 52  BILITOT 0.3 0.8 0.5  PROT 6.7 7.5 6.1*  ALBUMIN 3.8 4.1 3.4*    Recent Labs  09/24/15 0300  01/31/16 0530 05/09/16 0500 07/22/16 0700  WBC 7.7  < > 7.7 6.5 6.4  NEUTROABS 4.8  --   --   --  3.8  HGB 14.9  < > 13.9 14.3 15.1  HCT 45.6  < > 46.2 43.4 46.6  MCV 93.4  < > 94.1 91.0 90.8  PLT 177  < > 181 179 154  < > = values in this interval not displayed. Lab Results  Component Value Date   TSH 3.881 09/13/2013   Lab Results  Component Value Date   HGBA1C 5.7 (H) 03/28/2016   Lab Results  Component Value Date   CHOL 127 01/17/2016   HDL 25 (L) 01/17/2016   LDLCALC 67 01/17/2016   TRIG 177 (H) 01/17/2016   CHOLHDL 5.1 01/17/2016    Significant Diagnostic Results in last 30 days:  No results found.  Assessment/Plan  Assessment/Plan  History of blisters with MRSA-the blisters appear to have improved but he still has some erythema and warmth and tenderness in the lower left leg area-dermatology consult is trying to be arranged-nursing also spoken with vascular about follow-up on this which I feel is a good idea.  this has been arranged Will extend clindamycin through the weekend with reevaluation early next week-   [place], [place], [place]

## 2016-08-04 ENCOUNTER — Encounter: Payer: Self-pay | Admitting: Family

## 2016-08-05 ENCOUNTER — Encounter: Payer: Self-pay | Admitting: Internal Medicine

## 2016-08-05 NOTE — Progress Notes (Signed)
Location:   [facility] Room Number: 107W/ Place of Service:  SNF [phone]) Provider:  Freddi Starr, MD  Patient Care Team: [name], MD as PCP - General (Internal Medicine)  Extended Emergency Contact Information Primary Emergency Contact: [place], [place] [address] [place] Mobile Phone: [phone] Relation: Daughter  Code Status:  Full Code Goals of care: Advanced Directive information Advanced Directives 08/05/2016  Does Patient Have a Medical Advance Directive? Yes  Type of Advance Directive (No Data)  Does patient want to make changes to medical advance directive? No - Patient declined  Copy of [place] in Chart? -  Would patient like information on creating a medical advance directive? -  Pre-existing out of facility DNR order (yellow form or pink MOST form) -     Chief Complaint  Patient presents with  . Medical Management of Chronic Issues    Routine Visit    HPI:  Pt is a 61 y.o. male seen today for medical management of chronic diseases.     Past Medical History:  Diagnosis Date  . Anemia, unspecified   . Arthritis    "shoulders" (09/22/2014)  . Asthma   . Atrial fibrillation ([place])   . Carotid artery occlusion   . CHF (congestive heart failure) ([place])   . COPD 11/27/2007   Qualifier: Diagnosis of  By: Garen Grams    . Coronary artery disease    Cardiac catheterization in 2008 showed 99% stenosis in proximal left circumflex which was treated with Taxus drug-eluting stent. There was mild RCA and LAD disease with normal ejection fraction.  Marland Kitchen ERECTILE DYSFUNCTION 11/27/2007   Qualifier: Diagnosis of  By: Garen Grams    . Gastric ulcer, unspecified as acute or chronic, without mention of hemorrhage, perforation, or obstruction   . Heart murmur   . HYPERLIPIDEMIA 11/27/2007   Qualifier: Diagnosis of  By: Garen Grams    . Hypertension   . HYPERTENSION,  BENIGN 05/23/2009   Qualifier: Diagnosis of  By: Melvyn Novas MD, Christena Deem   . Other severe protein-calorie malnutrition   . PAD (peripheral artery disease) (HCC)    Previous right SFA stent in 2003. Directional atherectomy right SFA in 01/2013  . Pneumonia 2000  . Pressure ulcer, lower back(707.03)   . Tobacco use   . Traumatic amputation of leg(s) (complete) (partial), unilateral, at or above knee, without mention of complication    Past Surgical History:  Procedure Laterality Date  . ABDOMINAL AORTAGRAM N/A 02/23/2013   Procedure: ABDOMINAL Maxcine Ham;  Surgeon: Wellington Hampshire, MD;  Location: [facility] CATH LAB;  Service: Cardiovascular;  Laterality: N/A;  . ABDOMINAL AORTAGRAM N/A 09/22/2014   Procedure: ABDOMINAL Maxcine Ham;  Surgeon: Elam Dutch, MD;  Location: [facility] CATH LAB;  Service: Cardiovascular;  Laterality: N/A;  . ACNE CYST REMOVAL    . AMPUTATION Right 09/09/2013   Procedure: AMPUTATION ABOVE KNEE;  Surgeon: Rosetta Posner, MD;  Location: [facility];  Service: Vascular;  Laterality: Right;  . ATHERECTOMY N/A 03/02/2013   Procedure: ATHERECTOMY;  Surgeon: Wellington Hampshire, MD;  Location: [facility] CATH LAB;  Service: Cardiovascular;  Laterality: N/A;  . CARDIAC CATHETERIZATION N/A 09/03/2015   Procedure: Left Heart Cath and Coronary Angiography;  Surgeon: Leonie Man, MD;  Location: [facility] CV LAB;  Service: Cardiovascular;  Laterality: N/A;  . CARDIAC CATHETERIZATION N/A 09/03/2015   Procedure: Coronary Balloon Angioplasty;  Surgeon: Leonie Man, MD;  Location: [facility] CV LAB;  Service: Cardiovascular;  Laterality: N/A;  . CORONARY ANGIOPLASTY  09/03/2015  . ENDARTERECTOMY Left 08/16/2013   Procedure: Exploration of Left Neck/Fix Bleeding;  Surgeon: Elam Dutch, MD;  Location: [facility];  Service: Vascular;  Laterality: Left;  . ENDARTERECTOMY FEMORAL Left 01/30/2016   Procedure: ENDARTERECTOMY LEFT FEMORAL ARTERY;  Surgeon: Elam Dutch, MD;  Location: [facility] OR;  Service: Vascular;   Laterality: Left;  . ESOPHAGOGASTRODUODENOSCOPY N/A 09/08/2013   Procedure: ESOPHAGOGASTRODUODENOSCOPY (EGD);  Surgeon: Cleotis Nipper, MD;  Location: [facility] ENDOSCOPY;  Service: Endoscopy;  Laterality: N/A;  . ESOPHAGOGASTRODUODENOSCOPY (EGD) WITH PROPOFOL N/A 01/05/2014   Procedure: ESOPHAGOGASTRODUODENOSCOPY (EGD) WITH PROPOFOL;  Surgeon: Cleotis Nipper, MD;  Location: [facility] ENDOSCOPY;  Service: Endoscopy;  Laterality: N/A;  . FEMORAL ARTERY STENT Right 2003   [name] 09/22/2014  . FLEXIBLE SIGMOIDOSCOPY N/A 09/08/2013   Procedure: FLEXIBLE SIGMOIDOSCOPY;  Surgeon: Cleotis Nipper, MD;  Location[facility] ENDOSCOPY;  Service: Endoscopy;  Laterality: N/A;  unprepp  . ILIAC ARTERY STENT Left 09/22/2014  . NASAL SINUS SURGERY  2004  . PAROTIDECTOMY Right 03/29/2014   Procedure: PAROTIDECTOMY;  Surgeon: Ascencion Dike, MD;  Location: [facility];  Service: ENT;  Laterality: Right;  . PATCH ANGIOPLASTY Left 01/30/2016   Procedure: LEFT FEMORAL ARTYERY PATCH ANGIOPLASTY USING HEMASHIELD PLATINUM FINESSE PATCH;  Surgeon: Elam Dutch, MD;  Location: [facility];  Service: Vascular;  Laterality: Left;  . PERIPHERAL VASCULAR CATHETERIZATION N/A 08/17/2015   Procedure: Abdominal Aortogram;  Surgeon: Elam Dutch, MD;  Location: [facility] CV LAB;  Service: Cardiovascular;  Laterality: N/A;    Allergies  Allergen Reactions  . Codeine Nausea Only    Allergies as of 08/05/2016      Reactions   Codeine Nausea Only      Medication List       Accurate as of 08/05/16  2:19 PM. Always use your most recent med list.          aspirin EC 81 MG tablet Take 1 tablet (81 mg total) by mouth daily.   cloNIDine 0.2 MG tablet Commonly known as:  CATAPRES Take 0.2 mg by mouth 2 (two) times daily.   clopidogrel 75 MG tablet Commonly known as:  PLAVIX Take 75 mg by mouth daily. @@ 9:00 pm   colchicine 0.6 MG tablet Take 0.3 mg by mouth daily.   Fluticasone-Salmeterol 250-50 MCG/DOSE Aepb Commonly known as:  ADVAIR Inhale  1 puff into the lungs 2 (two) times daily.   furosemide 20 MG tablet Commonly known as:  LASIX Take 20 mg by mouth daily. Sunday, Tuesday, Thursday, Saturday   furosemide 40 MG tablet Commonly known as:  LASIX Take 40 mg by mouth daily. Mon,Wed,Fri   isosorbide mononitrate 30 MG 24 hr tablet Commonly known as:  IMDUR Take 1 tablet (30 mg total) by mouth daily.   lisinopril 40 MG tablet Commonly known as:  PRINIVIL,ZESTRIL Take 40 mg by mouth daily. Reported on 11/22/2015   metoprolol tartrate 25 MG tablet Commonly known as:  LOPRESSOR Take 1 tablet (25 mg total) by mouth 2 (two) times daily.   oxyCODONE 15 MG immediate release tablet Commonly known as:  ROXICODONE Take one tablet by mouth every 4 hours as needed for pain   pantoprazole 40 MG tablet Commonly known as:  PROTONIX Take 40 mg by mouth daily.   potassium chloride SA 20 MEQ tablet Commonly known as:  K-DUR,KLOR-CON Take 20  mEq by mouth daily.   simvastatin 40 MG tablet Commonly known as:  ZOCOR Take 40 mg by mouth daily.   temazepam 30 MG capsule Commonly known as:  RESTORIL Take one capsule by mouth at bedtime as needed for insomnia       Review of Systems  Immunization History  Administered Date(s) Administered  . DTP 05/28/2001  . Influenza-Unspecified 05/26/2013, 05/04/2014, 04/29/2016  . Pneumococcal-Unspecified 07/28/2009, 05/06/2016  . Tdap 12/30/2012   Pertinent  Health Maintenance Due  Topic Date Due  . COLONOSCOPY  11/22/2016 (Originally 11/24/2006)  . INFLUENZA VACCINE  Completed   Fall Risk  12/30/2012  Falls in the past year? No   Functional Status Survey:    There were no vitals filed for this visit. There is no height or weight on file to calculate BMI. Physical Exam  Labs reviewed:  Recent Labs  05/09/16 0500 06/02/16 0600 07/22/16 0700  NA 131* 135 135  K 4.2 3.9 3.7  CL 93* 96* 96*  CO2 30 30 30   GLUCOSE 123* 120* 144*  BUN 25* 31* 33*  CREATININE 1.72* 1.71*  1.73*  CALCIUM 8.7* 8.8* 8.9    Recent Labs  08/29/15 1055 01/17/16 0145 01/30/16 0739  AST 19 21 18   ALT 15* 16* 12*  ALKPHOS 63 62 52  BILITOT 0.3 0.8 0.5  PROT 6.7 7.5 6.1*  ALBUMIN 3.8 4.1 3.4*    Recent Labs  09/24/15 0300  01/31/16 0530 05/09/16 0500 07/22/16 0700  WBC 7.7  < > 7.7 6.5 6.4  NEUTROABS 4.8  --   --   --  3.8  HGB 14.9  < > 13.9 14.3 15.1  HCT 45.6  < > 46.2 43.4 46.6  MCV 93.4  < > 94.1 91.0 90.8  PLT 177  < > 181 179 154  < > = values in this interval not displayed. Lab Results  Component Value Date   TSH 3.881 09/13/2013   Lab Results  Component Value Date   HGBA1C 5.7 (H) 03/28/2016   Lab Results  Component Value Date   CHOL 127 01/17/2016   HDL 25 (L) 01/17/2016   LDLCALC 67 01/17/2016   TRIG 177 (H) 01/17/2016   CHOLHDL 5.1 01/17/2016    Significant Diagnostic Results in last 30 days:  No results found.  Assessment/Plan There are no diagnoses linked to this encounter.   Family/ staff Communication:   Labs/tests ordered:       This encounter was created in error - please disregard.

## 2016-08-07 ENCOUNTER — Ambulatory Visit (HOSPITAL_COMMUNITY)
Admission: RE | Admit: 2016-08-07 | Discharge: 2016-08-07 | Disposition: A | Discharge status: Home / Self Care | Payer: Medicare Other | Source: Ambulatory Visit | Attending: Family | Admitting: Family

## 2016-08-07 ENCOUNTER — Encounter: Payer: Self-pay | Admitting: Family

## 2016-08-07 ENCOUNTER — Ambulatory Visit (INDEPENDENT_AMBULATORY_CARE_PROVIDER_SITE_OTHER): Payer: Medicare Other | Admitting: Family

## 2016-08-07 ENCOUNTER — Other Ambulatory Visit: Payer: Self-pay | Admitting: *Deleted

## 2016-08-07 VITALS — BP 134/77 | HR 59 | Temp 98.8°F | Resp 18 | Ht 68.0 in | Wt 213.0 lb

## 2016-08-07 DIAGNOSIS — F17200 Nicotine dependence, unspecified, uncomplicated: Secondary | ICD-10-CM

## 2016-08-07 DIAGNOSIS — I872 Venous insufficiency (chronic) (peripheral): Secondary | ICD-10-CM

## 2016-08-07 DIAGNOSIS — I6523 Occlusion and stenosis of bilateral carotid arteries: Secondary | ICD-10-CM | POA: Diagnosis not present

## 2016-08-07 DIAGNOSIS — M79605 Pain in left leg: Secondary | ICD-10-CM | POA: Insufficient documentation

## 2016-08-07 DIAGNOSIS — L97211 Non-pressure chronic ulcer of right calf limited to breakdown of skin: Secondary | ICD-10-CM

## 2016-08-07 DIAGNOSIS — I739 Peripheral vascular disease, unspecified: Secondary | ICD-10-CM | POA: Diagnosis not present

## 2016-08-07 DIAGNOSIS — Z89611 Acquired absence of right leg above knee: Secondary | ICD-10-CM | POA: Diagnosis not present

## 2016-08-07 DIAGNOSIS — I10 Essential (primary) hypertension: Secondary | ICD-10-CM | POA: Diagnosis not present

## 2016-08-07 MED ORDER — OXYCODONE HCL 15 MG PO TABS: ORAL_TABLET | ORAL | 0 refills | Status: DC
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
## 2016-08-07 NOTE — Patient Instructions (Addendum)
Venous Ulcer Introduction A venous ulcer is a shallow sore on your lower leg that is caused by poor circulation in your veins. This condition used to be called stasis ulcer. Veins have valves that help return blood to the heart. If these valves do not work properly, it can cause blood to flow backward and to back up into the veins near the skin. When that happens, blood can pool in your lower legs. The blood can then leak out of your veins, which can irritate your skin. This may cause a break in your skin that becomes a venous ulcer. Venous ulcer is the most common type of lower leg ulcer. You may have venous ulcers on one leg or on both legs. The area where this condition most commonly develops is around the ankles. A venous ulcer may last for a long time (chronic ulcer) or it may return repeatedly (recurrent ulcer). What are the causes? Any condition that causes poor circulation to your legs can lead to a venous ulcer. What increases the risk? This condition is more likely to develop in:  People who are 48 years of age or older.  People who are overweight.  People who are not active.  People who have had a leg ulcer in the past.  People who have clots in their lower leg veins (deep vein thrombosis).  People who have inflammation of their leg veins (phlebitis).  Women who have given birth.  People who smoke. What are the signs or symptoms? The most common symptom of this condition is an open sore near your ankle. Other symptoms may include:  Swelling.  Thickening of the skin.  Fluid leaking from the ulcer.  Bleeding.  Itching.  Pain and swelling that gets worse when you stand up and feels better when you raise your leg.  Blotchy skin.  Darkening of the skin. How is this diagnosed? Your health care provider may suspect a venous ulcer based on your medical history and your risk factors. Your health care provider will check the skin on your legs. Other tests may be done to  learn more about the ulcer and to determine the best way to treat it. Tests that may be done include:  Measuring the blood pressure in your arms and legs.  Using sound waves (ultrasound) to measure the blood flow in your leg veins. How is this treated? You may need to try several different types of treatment to get your venous ulcer to heal. Healing may take a long time. Treatment may include:  Keeping your leg raised (elevated).  Wearing a type of bandage or stocking to compress the veins of your leg (compression therapy). Venous wounds are not likely to heal or to stay healed without compression.  Taking medicines to improve blood flow.  Taking antibiotic medicines to treat infection.  Cleaning your ulcer and removing any dead tissue from the wound (debridement).  Placing various types of medicated bandage (dressings) or medicated wraps on your ulcer. This helps the ulcer to heal and helps to prevent infection. Surgery is sometimes needed to close the wound using a piece of skin taken from another area of your body (graft). You may need surgery if other treatments are not working or if your ulcer is very deep. Follow these instructions at home: Wound care  Follow instructions from your health care provider about:  How to take care of your wound.  When and how you should change your bandage (dressing).  When you should remove your dressing.  If your dressing is dry and sticks to your leg when you try to remove it, moisten or wet the dressing with saline solution or water so that the dressing can be removed without harming your skin or wound tissue.  Check your wound every day for signs of infection. Have a caregiver do this for you if you are not able to do it yourself. Check for:  More redness, swelling, or pain.  More fluid or blood.  Pus, warmth, or a bad smell. Medicines  Take over-the-counter and prescription medicines only as told by your health care provider.  If you  were prescribed an antibiotic medicine, take it or apply it as told by your health care provider. Do not stop taking or using the antibiotic even if your condition improves. Activity  Do not stand or sit in one position for a long period of time. Rest with your legs raised during the day. If possible, keep your legs above your heart for 30 minutes, 3-4 times a day, or as told by your health care provider.  Do not sit with your legs crossed.  Walk often to increase the blood flow in your legs.Ask your health care provider what level of activity is safe for you.  If you are taking a long ride in a car or plane, take a break to walk around at least once every two hours, or as often as your health care provider recommends. Ask your health care provider if you should take aspirin before long trips. General instructions  Wear elastic stockings, compression stockings, or support hose as told by your health care provider. This is very important.  Raise the foot of your bed as told by your health care provider.  Do not smoke.  Keep all follow-up visits as told by your health care provider. This is important. Contact a health care provider if:  You have a fever.  Your ulcer is getting larger or is not healing.  Your pain gets worse.  You have more redness or swelling around your ulcer.  You have more fluid, blood, or pus coming from your ulcer after it has been cleaned by you or your health care provider.  You have warmth or a bad smell coming from your ulcer. This information is not intended to replace advice given to you by your health care provider. Make sure you discuss any questions you have with your health care provider. Document Released: 04/08/2001 Document Revised: 12/20/2015 Document Reviewed: 11/22/2014  2017 Elsevier      Peripheral Vascular Disease Peripheral vascular disease (PVD) is a disease of the blood vessels that are not part of your heart and brain. A simple  term for PVD is poor circulation. In most cases, PVD narrows the blood vessels that carry blood from your heart to the rest of your body. This can result in a decreased supply of blood to your arms, legs, and internal organs, like your stomach or kidneys. However, it most often affects a person's lower legs and feet. There are two types of PVD.  Organic PVD. This is the more common type. It is caused by damage to the structure of blood vessels.  Functional PVD. This is caused by conditions that make blood vessels contract and tighten (spasm). Without treatment, PVD tends to get worse over time. PVD can also lead to acute ischemic limb. This is when an arm or limb suddenly has trouble getting enough blood. This is a medical emergency. Follow these instructions at home:  Take medicines only as told by your doctor.  Do not use any tobacco products, including cigarettes, chewing tobacco, or electronic cigarettes. If you need help quitting, ask your doctor.  Lose weight if you are overweight, and maintain a healthy weight as told by your doctor.  Eat a diet that is low in fat and cholesterol. If you need help, ask your doctor.  Exercise regularly. Ask your doctor for some good activities for you.  Take good care of your feet.  Wear comfortable shoes that fit well.  Check your feet often for any cuts or sores. Contact a doctor if:  You have cramps in your legs while walking.  You have leg pain when you are at rest.  You have coldness in a leg or foot.  Your skin changes.  You are unable to get or have an erection (erectile dysfunction).  You have cuts or sores on your feet that are not healing. Get help right away if:  Your arm or leg turns cold and blue.  Your arms or legs become red, warm, swollen, painful, or numb.  You have chest pain or trouble breathing.  You suddenly have weakness in your face, arm, or leg.  You become very confused or you cannot speak.  You  suddenly have a very bad headache.  You suddenly cannot see. This information is not intended to replace advice given to you by your health care provider. Make sure you discuss any questions you have with your health care provider. Document Released: 10/08/2009 Document Revised: 12/20/2015 Document Reviewed: 12/22/2013 Elsevier Interactive Patient Education  2017 [employer].      Stroke Prevention Some medical conditions and behaviors are associated with an increased chance of having a stroke. You may prevent a stroke by making healthy choices and managing medical conditions. How can I reduce my risk of having a stroke?  Stay physically active. Get at least 30 minutes of activity on most or all days.  Do not smoke. It may also be helpful to avoid exposure to secondhand smoke.  Limit alcohol use. Moderate alcohol use is considered to be:  No more than 2 drinks per day for men.  No more than 1 drink per day for nonpregnant women.  Eat healthy foods. This involves:  Eating 5 or more servings of fruits and vegetables a day.  Making dietary changes that address high blood pressure (hypertension), high cholesterol, diabetes, or obesity.  Manage your cholesterol levels.  Making food choices that are high in fiber and low in saturated fat, trans fat, and cholesterol may control cholesterol levels.  Take any prescribed medicines to control cholesterol as directed by your health care provider.  Manage your diabetes.  Controlling your carbohydrate and sugar intake is recommended to manage diabetes.  Take any prescribed medicines to control diabetes as directed by your health care provider.  Control your hypertension.  Making food choices that are low in salt (sodium), saturated fat, trans fat, and cholesterol is recommended to manage hypertension.  Ask your health care provider if you need treatment to lower your blood pressure. Take any prescribed medicines to control  hypertension as directed by your health care provider.  If you are 96-47 years of age, have your blood pressure checked every 3-5 years. If you are 57 years of age or older, have your blood pressure checked every year.  Maintain a healthy weight.  Reducing calorie intake and making food choices that are low in sodium, saturated fat, trans fat, and  cholesterol are recommended to manage weight.  Stop drug abuse.  Avoid taking birth control pills.  Talk to your health care provider about the risks of taking birth control pills if you are over 43 years old, smoke, get migraines, or have ever had a blood clot.  Get evaluated for sleep disorders (sleep apnea).  Talk to your health care provider about getting a sleep evaluation if you snore a lot or have excessive sleepiness.  Take medicines only as directed by your health care provider.  For some people, aspirin or blood thinners (anticoagulants) are helpful in reducing the risk of forming abnormal blood clots that can lead to stroke. If you have the irregular heart rhythm of atrial fibrillation, you should be on a blood thinner unless there is a good reason you cannot take them.  Understand all your medicine instructions.  Make sure that other conditions (such as anemia or atherosclerosis) are addressed. Get help right away if:  You have sudden weakness or numbness of the face, arm, or leg, especially on one side of the body.  Your face or eyelid droops to one side.  You have sudden confusion.  You have trouble speaking (aphasia) or understanding.  You have sudden trouble seeing in one or both eyes.  You have sudden trouble walking.  You have dizziness.  You have a loss of balance or coordination.  You have a sudden, severe headache with no known cause.  You have new chest pain or an irregular heartbeat. Any of these symptoms may represent a serious problem that is an emergency. Do not wait to see if the symptoms will go  away. Get medical help at once. Call your local emergency services (911 in [place] Do not drive yourself to the hospital.  This information is not intended to replace advice given to you by your health care provider. Make sure you discuss any questions you have with your health care provider. Document Released: 08/21/2004 Document Revised: 12/20/2015 Document Reviewed: 01/14/2013 Elsevier Interactive Patient Education  2017 [employer].       Steps to Quit Smoking Smoking tobacco can be bad for your health. It can also affect almost every organ in your body. Smoking puts you and people around you at risk for many serious long-lasting (chronic) diseases. Quitting smoking is hard, but it is one of the best things that you can do for your health. It is never too late to quit. What are the benefits of quitting smoking? When you quit smoking, you lower your risk for getting serious diseases and conditions. They can include:  Lung cancer or lung disease.  Heart disease.  Stroke.  Heart attack.  Not being able to have children (infertility).  Weak bones (osteoporosis) and broken bones (fractures). If you have coughing, wheezing, and shortness of breath, those symptoms may get better when you quit. You may also get sick less often. If you are pregnant, quitting smoking can help to lower your chances of having a baby of low birth weight. What can I do to help me quit smoking? Talk with your doctor about what can help you quit smoking. Some things you can do (strategies) include:  Quitting smoking totally, instead of slowly cutting back how much you smoke over a period of time.  Going to in-person counseling. You are more likely to quit if you go to many counseling sessions.  Using resources and support systems, such as:  Online chats with a Social worker.  Phone quitlines.  Printed Furniture conservator/restorer.  Support groups or group counseling.  Text messaging programs.  Mobile phone apps  or applications.  Taking medicines. Some of these medicines may have nicotine in them. If you are pregnant or breastfeeding, do not take any medicines to quit smoking unless your doctor says it is okay. Talk with your doctor about counseling or other things that can help you. Talk with your doctor about using more than one strategy at the same time, such as taking medicines while you are also going to in-person counseling. This can help make quitting easier. What things can I do to make it easier to quit? Quitting smoking might feel very hard at first, but there is a lot that you can do to make it easier. Take these steps:  Talk to your family and friends. Ask them to support and encourage you.  Call phone quitlines, reach out to support groups, or work with a Social worker.  Ask people who smoke to not smoke around you.  Avoid places that make you want (trigger) to smoke, such as:  Bars.  Parties.  Smoke-break areas at work.  Spend time with people who do not smoke.  Lower the stress in your life. Stress can make you want to smoke. Try these things to help your stress:  Getting regular exercise.  Deep-breathing exercises.  Yoga.  Meditating.  Doing a body scan. To do this, close your eyes, focus on one area of your body at a time from head to toe, and notice which parts of your body are tense. Try to relax the muscles in those areas.  Download or buy apps on your mobile phone or tablet that can help you stick to your quit plan. There are many free apps, such as QuitGuide from the [employer] manager for Disease Control and Prevention). You can find more support from smokefree.gov and other websites. This information is not intended to replace advice given to you by your health care provider. Make sure you discuss any questions you have with your health care provider. Document Released: 05/10/2009 Document Revised: 03/11/2016 Document Reviewed: 11/28/2014 Elsevier Interactive Patient  Education  2017 [employer].

## 2016-08-15 ENCOUNTER — Non-Acute Institutional Stay (SKILLED_NURSING_FACILITY): Payer: Medicare Other | Admitting: Internal Medicine

## 2016-08-15 ENCOUNTER — Encounter: Payer: Self-pay | Admitting: Internal Medicine

## 2016-08-15 DIAGNOSIS — G8929 Other chronic pain: Secondary | ICD-10-CM | POA: Diagnosis not present

## 2016-08-15 DIAGNOSIS — N289 Disorder of kidney and ureter, unspecified: Secondary | ICD-10-CM

## 2016-08-15 DIAGNOSIS — I739 Peripheral vascular disease, unspecified: Secondary | ICD-10-CM | POA: Diagnosis not present

## 2016-08-15 DIAGNOSIS — I5032 Chronic diastolic (congestive) heart failure: Secondary | ICD-10-CM

## 2016-08-15 DIAGNOSIS — I10 Essential (primary) hypertension: Secondary | ICD-10-CM

## 2016-08-15 DIAGNOSIS — L03116 Cellulitis of left lower limb: Secondary | ICD-10-CM | POA: Diagnosis not present

## 2016-08-15 NOTE — Progress Notes (Signed)
Location:   [facility] Room Number: 107/W Place of Service:  SNF [phone]) Provider:  Freddi Starr, MD  Patient Care Team: [name], MD as PCP - General (Internal Medicine)  Extended Emergency Contact Information Primary Emergency Contact: [place], [place] [address] [place] Mobile Phone: [phone] Relation: Daughter  Code Status:  Full Code Goals of care: Advanced Directive information Advanced Directives 08/15/2016  Does Patient Have a Medical Advance Directive? Yes  Type of Advance Directive (No Data)  Does patient want to make changes to medical advance directive? No - Patient declined  Copy of [place] in Chart? -  Would patient like information on creating a medical advance directive? -  Pre-existing out of facility DNR order (yellow form or pink MOST form) -     Chief Complaint  Patient presents with  . Medical Management of Chronic Issues    Routine Visit   For medical management of chronic medical conditions including peripheral arterial disease hypertension-chronic diastolic CHF-chronic kidney disease stage III-hypertension-history of cellulitis  HPI:  Pt is a 61 y.o. male seen today for medical management of chronic diseases.  As noted above.  Most recent acute issue has been cellulitis of the left lower leg-that did grow out MRSA he has finished a course of clindamycin-she is also seen vascular with recommendation for Unna boots and this appears to have helped significantly this appears significantly improved with no further drainage the area appears less erythematous and dryer.  He does have vascular follow-up.  His other medical issues appear to be stable he does have a history of hypertension he is on lisinopril metoprolol Lasix and clonidine recent blood pressures 121/70-108/60.  In regards to peripheral artery disease he is on aspirin and Plavix as well as a  statin most recent LDL was 67.  He does have a history of chronic kidney disease creatinine of 1.73 appears to be relatively baseline here for the past several months with a BUN of 33.  He also has chronic bronchitis however this is complicated because he continues to smoke and has a chronic cough with wheezing.  This appears to be relatively stable he does not report any increase shortness of breath continues to have chronic cough however with some wheezing   He does have some history of a GI bleed that was thought possibly nonsteroid induced hemoglobin has been stable now for sometimes most recently 15.1  on lab done in December-he is on protonix his NSAIDs have been discontinued   He does have a history of a  partial parathyroidectomy he tolerated procedure well. 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 Anemia, unspecified   . Arthritis    "shoulders" (09/22/2014)  . Asthma   . Atrial fibrillation ([facility])   . Carotid artery occlusion   . CHF (congestive heart failure) ([facility])   . COPD 11/27/2007   Qualifier: Diagnosis of  By: Garen Grams    . Coronary artery disease    Cardiac catheterization in 2008 showed 99% stenosis in proximal left circumflex which was treated with Taxus drug-eluting stent. There was mild RCA and LAD disease with normal ejection fraction.  Marland Kitchen ERECTILE DYSFUNCTION 11/27/2007   Qualifier: Diagnosis of  By: Mellody Dance,  Tiffany    . Gastric ulcer, unspecified as acute or chronic, without mention of hemorrhage, perforation, or obstruction   . Heart murmur   . HYPERLIPIDEMIA 11/27/2007   Qualifier: Diagnosis of  By: Garen Grams    . Hypertension   . HYPERTENSION, BENIGN 05/23/2009   Qualifier: Diagnosis of  By: Melvyn Novas MD, Christena Deem   . Other severe protein-calorie malnutrition   . PAD  (peripheral artery disease) ([facility])    Previous right SFA stent in 2003. Directional atherectomy right SFA in 01/2013  . Pneumonia 2000  . Pressure ulcer, lower back(707.03)   . Tobacco use   . Traumatic amputation of leg(s) (complete) (partial), unilateral, at or above knee, without mention of complication    Past Surgical History:  Procedure Laterality Date  . ABDOMINAL AORTAGRAM N/A 02/23/2013   Procedure: ABDOMINAL Maxcine Ham;  Surgeon: Wellington Hampshire, MD;  Location: [facility] CATH LAB;  Service: Cardiovascular;  Laterality: N/A;  . ABDOMINAL AORTAGRAM N/A 09/22/2014   Procedure: ABDOMINAL Maxcine Ham;  Surgeon: Elam Dutch, MD;  Location: [facility] CATH LAB;  Service: Cardiovascular;  Laterality: N/A;  . ACNE CYST REMOVAL    . AMPUTATION Right 09/09/2013   Procedure: AMPUTATION ABOVE KNEE;  Surgeon: Rosetta Posner, MD;  Location: [facility];  Service: Vascular;  Laterality: Right;  . ATHERECTOMY N/A 03/02/2013   Procedure: ATHERECTOMY;  Surgeon: Wellington Hampshire, MD;  Location: [facility] CATH LAB;  Service: Cardiovascular;  Laterality: N/A;  . CARDIAC CATHETERIZATION N/A 09/03/2015   Procedure: Left Heart Cath and Coronary Angiography;  Surgeon: Leonie Man, MD;  Location: [facility] CV LAB;  Service: Cardiovascular;  Laterality: N/A;  . CARDIAC CATHETERIZATION N/A 09/03/2015   Procedure: Coronary Balloon Angioplasty;  Surgeon: Leonie Man, MD;  Location: [place] CV LAB;  Service: Cardiovascular;  Laterality: N/A;  . CORONARY ANGIOPLASTY  09/03/2015  . ENDARTERECTOMY Left 08/16/2013   Procedure: Exploration of Left Neck/Fix Bleeding;  Surgeon: Elam Dutch, MD;  Location: [facility];  Service: Vascular;  Laterality: Left;  . ENDARTERECTOMY FEMORAL Left 01/30/2016   Procedure: ENDARTERECTOMY LEFT FEMORAL ARTERY;  Surgeon: Elam Dutch, MD;  Location: [facility] OR;  Service: Vascular;  Laterality: Left;  . ESOPHAGOGASTRODUODENOSCOPY N/A 09/08/2013   Procedure: ESOPHAGOGASTRODUODENOSCOPY (EGD);  Surgeon: Cleotis Nipper, MD;  Location[facility] ENDOSCOPY;  Service: Endoscopy;  Laterality: N/A;  . ESOPHAGOGASTRODUODENOSCOPY (EGD) WITH PROPOFOL N/A 01/05/2014   Procedure: ESOPHAGOGASTRODUODENOSCOPY (EGD) WITH PROPOFOL;  Surgeon: Cleotis Nipper, MD;  Location: [facility] ENDOSCOPY;  Service: Endoscopy;  Laterality: N/A;  . FEMORAL ARTERY STENT Right 2003   [name] 09/22/2014  . FLEXIBLE SIGMOIDOSCOPY N/A 09/08/2013   Procedure: FLEXIBLE SIGMOIDOSCOPY;  Surgeon: Cleotis Nipper, MD;  Location: [facility] ENDOSCOPY;  Service: Endoscopy;  Laterality: N/A;  unprepp  . ILIAC ARTERY STENT Left 09/22/2014  . NASAL SINUS SURGERY  2004  . PAROTIDECTOMY Right 03/29/2014   Procedure: PAROTIDECTOMY;  Surgeon: Ascencion Dike, MD;  Location: [facility];  Service: ENT;  Laterality: Right;  . PATCH ANGIOPLASTY Left 01/30/2016   Procedure: LEFT FEMORAL ARTYERY PATCH ANGIOPLASTY USING HEMASHIELD PLATINUM FINESSE PATCH;  Surgeon: Elam Dutch, MD;  Location: [facility];  Service: Vascular;  Laterality: Left;  . PERIPHERAL VASCULAR CATHETERIZATION N/A 08/17/2015   Procedure: Abdominal Aortogram;  Surgeon: Elam Dutch, MD;  Location: [facility] CV LAB;  Service: Cardiovascular;  Laterality: N/A;    Allergies  Allergen Reactions  . Codeine Nausea Only    Allergies as of 08/15/2016  Reactions   Codeine Nausea Only      Medication List       Accurate as of 08/15/16 10:19 AM. Always use your most recent med list.          aspirin EC 81 MG tablet Take 1 tablet (81 mg total) by mouth daily.   cloNIDine 0.2 MG tablet Commonly known as:  CATAPRES Take 0.2 mg by mouth 2 (two) times daily.   clopidogrel 75 MG tablet Commonly known as:  PLAVIX Take 75 mg by mouth daily. @@ 9:00 pm   colchicine 0.6 MG tablet Take 0.3 mg by mouth daily.   Fluticasone-Salmeterol 250-50 MCG/DOSE Aepb Commonly known as:  ADVAIR Inhale 1 puff into the lungs 2 (two) times daily.   furosemide 20 MG tablet Commonly known as:  LASIX Take 20 mg by mouth daily.  Sunday, Tuesday, Thursday, Saturday   furosemide 40 MG tablet Commonly known as:  LASIX Take 40 mg by mouth daily. Mon,Wed,Fri   isosorbide mononitrate 30 MG 24 hr tablet Commonly known as:  IMDUR Take 1 tablet (30 mg total) by mouth daily.   lisinopril 40 MG tablet Commonly known as:  PRINIVIL,ZESTRIL Take 40 mg by mouth daily. Reported on 11/22/2015   metoprolol tartrate 25 MG tablet Commonly known as:  LOPRESSOR Take 1 tablet (25 mg total) by mouth 2 (two) times daily.   oxyCODONE 15 MG immediate release tablet Commonly known as:  ROXICODONE Take one tablet by mouth every 4 hours as needed for pain   pantoprazole 40 MG tablet Commonly known as:  PROTONIX Take 40 mg by mouth daily.   potassium chloride SA 20 MEQ tablet Commonly known as:  K-DUR,KLOR-CON Take 20 mEq by mouth daily.   simvastatin 40 MG tablet Commonly known as:  ZOCOR Take 40 mg by mouth daily.   temazepam 30 MG capsule Commonly known as:  RESTORIL Take one capsule by mouth at bedtime as needed for insomnia       Review of Systems  Gen. has no complaints of fever or chills.  Skin as noted above does a history of left leg cellulitis this appears improved as noted above.  Head ears eyes nose mouth and throat does not complaining of any sore throat or visual changes.  Respiratory has history of bronchitis with some chronic cough and wheezing but this has not progressed he continues to smoke does not complain of shortness of breath.  Cardiac does not complain of chest pain does have some right lower extremity edema which appears baseline.  GI does not complain of nausea vomiting diarrhea constipation or abdominal pain.  Musculoskeletal does have chronic pain more so of his shoulders he is receiving oxycodone when necessary which she takes quite regularly.  Neurologic is not complaining of dizziness headache or syncope or numbness.  Psych does not complain of overt anxiety or  depression   Immunization History  Administered Date(s) Administered  . DTP 05/28/2001  . Influenza-Unspecified 05/26/2013, 05/04/2014, 04/29/2016  . Pneumococcal-Unspecified 07/28/2009, 05/06/2016  . Tdap 12/30/2012   Pertinent  Health Maintenance Due  Topic Date Due  . COLONOSCOPY  11/22/2016 (Originally 11/24/2006)  . INFLUENZA VACCINE  Completed   Fall Risk  12/30/2012  Falls in the past year? No   Functional Status Survey:    Vitals:   08/15/16 0951  BP: 121/70  Pulse: 66  Resp: (!) 24  Temp: 98.4 F (36.9 C)  TempSrc: Oral  SpO2: 100%  Weight: 213 lb 6.4 oz (96.8 kg)  Height: 5\' 8"  (1.727 m)   Body mass index is 32.45 kg/m. Physical Exam   In general this is a well-nourished middle-aged male in no distress.  His skin is warm and dry-left lower leg erythema appears significantly improved I do not really see any drainage were the area cellulitis was-he does have some small blisters in the  knee area which appear to be chronic and unchanged  I do note on top of his scalp there is a crusted area mid scalp area-.  Oropharynx is clear mucous membranes moist.  Chest he has some diffuse rhonchi wheezing on expiration there is no labored breathing this is relatively baseline but previous exams.  Heart is regular rate and rhythm without murmur gallop or rub he has some mild lower extremity edema which appears baseline.  Abdomen is somewhat obese soft nontender with positive bowel sounds.  Musculoskeletal is status post  A right above-the-knee amputation -- strength appears preserved in all other extremities.--Continue stimulate in wheelchair-actually has gained significant upper extremity strength during his stay here.  Neurologic is grossly intact no lateralizing findings her speech is clear.  Psych he is alert and oriented pleasant and appropriate.      Labs reviewed:  Recent Labs  05/09/16 0500 06/02/16 0600 07/22/16 0700  NA 131* 135 135  K 4.2 3.9  3.7  CL 93* 96* 96*  CO2 30 30 30   GLUCOSE 123* 120* 144*  BUN 25* 31* 33*  CREATININE 1.72* 1.71* 1.73*  CALCIUM 8.7* 8.8* 8.9    Recent Labs  08/29/15 1055 01/17/16 0145 01/30/16 0739  AST 19 21 18   ALT 15* 16* 12*  ALKPHOS 63 62 52  BILITOT 0.3 0.8 0.5  PROT 6.7 7.5 6.1*  ALBUMIN 3.8 4.1 3.4*    Recent Labs  09/24/15 0300  01/31/16 0530 05/09/16 0500 07/22/16 0700  WBC 7.7  < > 7.7 6.5 6.4  NEUTROABS 4.8  --   --   --  3.8  HGB 14.9  < > 13.9 14.3 15.1  HCT 45.6  < > 46.2 43.4 46.6  MCV 93.4  < > 94.1 91.0 90.8  PLT 177  < > 181 179 154  < > = values in this interval not displayed. Lab Results  Component Value Date   TSH 3.881 09/13/2013   Lab Results  Component Value Date   HGBA1C 5.7 (H) 03/28/2016   Lab Results  Component Value Date   CHOL 127 01/17/2016   HDL 25 (L) 01/17/2016   LDLCALC 67 01/17/2016   TRIG 177 (H) 01/17/2016   CHOLHDL 5.1 01/17/2016    Significant Diagnostic Results in last 30 days:  No results found.  Assessment/Plan    Cellulitis of left lower extremity-this appears improved status post clindamycin has also had uterine dressing and this appears to be helping significantly at this point will monitor-we did have a dermatology consult scheduled but this is been canceled secondary to improvement in clinical presentation-.  I do note he doesn't area on the top of the scalp that will have to be monitored however-I did suggest possibly following up with dermatology but patient would like to defer for now.  At this point will monitor for changes in the scalp lesion.  #2 history of PAD-she continues on aspirin and Plavix as well as a statin last LDL was 67 he is followed by vascular as noted above.  #3 hypertension this appears to be stable on clonidine Lasix metoprolol and lisinopril.  #4 history chronicity  diastolic CHF this appears to be stable as echo in February 2015 was 55%-he is on alternating doses of 40 and 20 mg of  Lasix-this is complicated with his history of renal insufficiency-his weight appears relatively stable at 213  #5 chronic bronchitis again he does continue to smoke continues with some baseline chest congestion and coughing but this does not appear to be progressing-he has expressed desires for no aggressive intervention here.--He is on Advair Diskus twice a day  #6 history of chronic kidney disease stage III this appears stable with most recent creatinine of 1.73 in late December 2017 will update this next week  #7-history of chronic pain osteoarthritis-she does receive oxycodone 15 mg every 4 hours when necessary he takes this fairly regularly this appears to be stable and he is not really complaining of pain today but has been an issue in the past.  #8 history of GI bleed-again he is on proton aches hemoglobin has shown stability to improvement will update this next week as well.  #9 history of suspected gout flare in the past he is on low-dose colchicine once a day he appears to be stable he does not report any side effects diarrhea  CPT 99310--of note greater than 35 minutes spent assessing patient-discussing his status with nursing-reviewing his chart-reviewing his labs-and coordinating and formulating a plan of care for numerous diagnoses-of note greater than 50% of time spent coordinating a plan of care     .

## 2016-08-29 ENCOUNTER — Encounter: Payer: Self-pay | Admitting: Family

## 2016-09-03 ENCOUNTER — Other Ambulatory Visit: Payer: Self-pay | Admitting: *Deleted

## 2016-09-03 MED ORDER — OXYCODONE HCL 15 MG PO TABS: ORAL_TABLET | ORAL | 0 refills | Status: DC

## 2016-09-03 NOTE — Telephone Encounter (Signed)
[facility] Nursing #[phone] Fax: [phone]   

## 2016-09-05 ENCOUNTER — Ambulatory Visit (INDEPENDENT_AMBULATORY_CARE_PROVIDER_SITE_OTHER): Payer: Medicare Other | Admitting: Family

## 2016-09-05 ENCOUNTER — Encounter: Payer: Self-pay | Admitting: Family

## 2016-09-05 VITALS — BP 125/71 | HR 59 | Temp 97.1°F | Resp 20 | Ht 68.0 in | Wt 213.0 lb

## 2016-09-05 DIAGNOSIS — Z89611 Acquired absence of right leg above knee: Secondary | ICD-10-CM

## 2016-09-05 DIAGNOSIS — L97211 Non-pressure chronic ulcer of right calf limited to breakdown of skin: Secondary | ICD-10-CM | POA: Diagnosis not present

## 2016-09-05 DIAGNOSIS — I872 Venous insufficiency (chronic) (peripheral): Secondary | ICD-10-CM

## 2016-09-05 DIAGNOSIS — I739 Peripheral vascular disease, unspecified: Secondary | ICD-10-CM | POA: Diagnosis not present

## 2016-09-05 DIAGNOSIS — Z9862 Peripheral vascular angioplasty status: Secondary | ICD-10-CM | POA: Diagnosis not present

## 2016-09-05 DIAGNOSIS — I6523 Occlusion and stenosis of bilateral carotid arteries: Secondary | ICD-10-CM | POA: Diagnosis not present

## 2016-09-05 DIAGNOSIS — Z95828 Presence of other vascular implants and grafts: Secondary | ICD-10-CM

## 2016-09-05 DIAGNOSIS — F17200 Nicotine dependence, unspecified, uncomplicated: Secondary | ICD-10-CM | POA: Diagnosis not present
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
## 2016-09-05 NOTE — Patient Instructions (Signed)
Peripheral Vascular Disease Peripheral vascular disease (PVD) is a disease of the blood vessels that are not part of your heart and brain. A simple term for PVD is poor circulation. In most cases, PVD narrows the blood vessels that carry blood from your heart to the rest of your body. This can result in a decreased supply of blood to your arms, legs, and internal organs, like your stomach or kidneys. However, it most often affects a person's lower legs and feet. There are two types of PVD.  Organic PVD. This is the more common type. It is caused by damage to the structure of blood vessels.  Functional PVD. This is caused by conditions that make blood vessels contract and tighten (spasm). Without treatment, PVD tends to get worse over time. PVD can also lead to acute ischemic limb. This is when an arm or limb suddenly has trouble getting enough blood. This is a medical emergency. Follow these instructions at home:  Take medicines only as told by your doctor.  Do not use any tobacco products, including cigarettes, chewing tobacco, or electronic cigarettes. If you need help quitting, ask your doctor.  Lose weight if you are overweight, and maintain a healthy weight as told by your doctor.  Eat a diet that is low in fat and cholesterol. If you need help, ask your doctor.  Exercise regularly. Ask your doctor for some good activities for you.  Take good care of your feet.  Wear comfortable shoes that fit well.  Check your feet often for any cuts or sores. Contact a doctor if:  You have cramps in your legs while walking.  You have leg pain when you are at rest.  You have coldness in a leg or foot.  Your skin changes.  You are unable to get or have an erection (erectile dysfunction).  You have cuts or sores on your feet that are not healing. Get help right away if:  Your arm or leg turns cold and blue.  Your arms or legs become red, warm, swollen, painful, or numb.  You have  chest pain or trouble breathing.  You suddenly have weakness in your face, arm, or leg.  You become very confused or you cannot speak.  You suddenly have a very bad headache.  You suddenly cannot see. This information is not intended to replace advice given to you by your health care provider. Make sure you discuss any questions you have with your health care provider. Document Released: 10/08/2009 Document Revised: 12/20/2015 Document Reviewed: 12/22/2013 Elsevier Interactive Patient Education  2017 Elsevier Inc.      Venous Ulcer Introduction A venous ulcer is a shallow sore on your lower leg. It is caused by poor circulation in your veins. Venous ulcer is the most common type of lower leg ulcer. You may have venous ulcers on one leg or on both legs. This condition most often develops around your ankles. This type of ulcer may last for a long time (chronic ulcer) or it may return often (recurrent ulcer). Follow these instructions at home: Wound care  Follow instructions from your doctor about:  How to take care of your wound.  When and how you should change your bandage (dressing).  When you should remove your bandage. If your bandage is dry and gets stuck to your leg when you try to remove it, moisten or wet the bandage with saline solution or water. This helps you to remove it without harming your skin or wound.  Check  your wound every day for signs of infection. Have a caregiver do this for you if you are not able to do it yourself. Watch for:  More redness, swelling, or pain.  More fluid or blood.  Pus, warmth, or a bad smell. Medicines  Take over-the-counter and prescription medicines only as told by your doctor.  If you were prescribed an antibiotic medicine, take it or apply it as told by your doctor. Do not stop taking or using the antibiotic even if your condition improves. Activity  Do not stand or sit in one position for a long period of time. Rest with your  legs raised during the day. If possible, keep your legs above your heart for 30 minutes, 3-4 times a day, or as told by your doctor.  Do not sit with your legs crossed.  Walk often to increase the blood flow in your legs. Ask your doctor what level of activity is safe for you.  If you are taking a long ride in a car or plane, take a break to walk around at least once every two hours, or as told by your doctor. Ask your doctor if you should take aspirin before long trips. General instructions  Wear elastic stockings, compression stockings, or support hose as told by your doctor. This is very important.  Raise the foot of your bed as told by your doctor.  Do not smoke.  Keep all follow-up visits as told by your doctor. This is important. Contact a doctor if:  You have a fever.  Your ulcer is getting larger or is not healing.  Your pain gets worse.  You have more redness or swelling around your ulcer.  You have more fluid, blood, or pus coming from your ulcer after it has been cleaned by you or your doctor.  You have warmth or a bad smell coming from your ulcer. This information is not intended to replace advice given to you by your health care provider. Make sure you discuss any questions you have with your health care provider. Document Released: 08/21/2004 Document Revised: 12/20/2015 Document Reviewed: 11/22/2014  2017 Elsevier

## 2016-09-09 ENCOUNTER — Encounter: Payer: Self-pay | Admitting: Family

## 2016-09-11 ENCOUNTER — Encounter: Payer: Medicare Other | Admitting: Family

## 2016-09-18 ENCOUNTER — Ambulatory Visit: Payer: Medicare Other | Admitting: Family

## 2016-09-18 DIAGNOSIS — L97221 Non-pressure chronic ulcer of left calf limited to breakdown of skin: Principal | ICD-10-CM

## 2016-09-18 DIAGNOSIS — I83022 Varicose veins of left lower extremity with ulcer of calf: Secondary | ICD-10-CM

## 2016-09-18 NOTE — Progress Notes (Signed)
Pt came  In for an [employer] change to his left leg.  He has small venous stasis ulcer (0.5 cm x 1.0 cm) on the posterior of his left calf.  He denies pain at this time and says the wound is looking much better each week.  Mr. Edwin [name] tolerated the [employer] change well, without complaints.  He will come in to our office for weekly Unna Boot/dressing changes and will contact our office if he has any other problems.  Raymundo Rout E., LPN.

## 2016-09-19 ENCOUNTER — Encounter: Payer: Self-pay | Admitting: Family

## 2016-09-19 ENCOUNTER — Other Ambulatory Visit: Payer: Self-pay | Admitting: *Deleted

## 2016-09-19 MED ORDER — TEMAZEPAM 15 MG PO CAPS: ORAL_CAPSULE | ORAL | 0 refills | Status: DC

## 2016-09-19 NOTE — Telephone Encounter (Signed)
[facility] Nursing #[phone] Fax: [phone]   

## 2016-09-25 ENCOUNTER — Encounter: Payer: Medicare Other | Admitting: Family

## 2016-09-25 ENCOUNTER — Encounter: Payer: Self-pay | Admitting: Family

## 2016-10-02 ENCOUNTER — Ambulatory Visit: Payer: Medicare Other | Admitting: Family

## 2016-10-08 ENCOUNTER — Other Ambulatory Visit: Payer: Self-pay | Admitting: *Deleted

## 2016-10-08 MED ORDER — OXYCODONE HCL 15 MG PO TABS: ORAL_TABLET | ORAL | 0 refills | Status: DC

## 2016-10-08 NOTE — Telephone Encounter (Signed)
[facility] Nursing #[phone] Fax: [phone]   

## 2016-10-09 ENCOUNTER — Ambulatory Visit (INDEPENDENT_AMBULATORY_CARE_PROVIDER_SITE_OTHER): Payer: Medicare Other | Admitting: Vascular Surgery

## 2016-10-09 VITALS — BP 130/78 | HR 60 | Temp 98.8°F | Resp 18 | Ht 68.0 in | Wt 211.0 lb

## 2016-10-09 DIAGNOSIS — I83022 Varicose veins of left lower extremity with ulcer of calf: Secondary | ICD-10-CM | POA: Diagnosis not present

## 2016-10-09 DIAGNOSIS — I6523 Occlusion and stenosis of bilateral carotid arteries: Secondary | ICD-10-CM

## 2016-10-09 DIAGNOSIS — L97221 Non-pressure chronic ulcer of left calf limited to breakdown of skin: Secondary | ICD-10-CM

## 2016-10-09 NOTE — Progress Notes (Signed)
[facility] and [facility]in Specialist of [place]  Patient name: Edwin [name] MRN: [id number] DOB: 07/01/1956 Sex: male  REASON FOR VISIT: unna boot follow-up  HPI: Edwin [name] is a 61 y.o. male who presents for evaluation of his left venous stasis ulcer. He has completed 4 weeks of unna boot therapy. He is s/p left femoral endarterectomy by Dr. Oneida Alar on 01/30/16 and previous right AKA. We also follow him for asymptomatic carotid stenosis.   He has been doing well. He uses his wheelchair to mobilize because his right leg prosthesis "slows me down."   Past Medical History:  Diagnosis Date  . Anemia, unspecified   . Arthritis    "shoulders" (09/22/2014)  . Asthma   . Atrial fibrillation ([place])   . Carotid artery occlusion   . CHF (congestive heart failure) ([place])   . COPD 11/27/2007   Qualifier: Diagnosis of  By: Garen Grams    . Coronary artery disease    Cardiac catheterization in 2008 showed 99% stenosis in proximal left circumflex which was treated with Taxus drug-eluting stent. There was mild RCA and LAD disease with normal ejection fraction.  Marland Kitchen ERECTILE DYSFUNCTION 11/27/2007   Qualifier: Diagnosis of  By: Garen Grams    . Gastric ulcer, unspecified as acute or chronic, without mention of hemorrhage, perforation, or obstruction   . Heart murmur   . HYPERLIPIDEMIA 11/27/2007   Qualifier: Diagnosis of  By: Garen Grams    . Hypertension   . HYPERTENSION, BENIGN 05/23/2009   Qualifier: Diagnosis of  By: Melvyn Novas MD, Christena Deem   . Other severe protein-calorie malnutrition   . PAD (peripheral artery disease) (HCC)    Previous right SFA stent in 2003. Directional atherectomy right SFA in 01/2013  . Pneumonia 2000  . Pressure ulcer, lower back(707.03)   . Tobacco use   . Traumatic amputation of leg(s) (complete) (partial), unilateral, at or above knee, without mention of complication     Family History  Problem Relation Age of Onset  . Adopted: Yes  . Heart disease  Maternal Grandfather   . Heart disease Paternal Grandfather     SOCIAL HISTORY: Social History  Substance Use Topics  . Smoking status: Current Every Day Smoker    Packs/day: 1.00    Years: 41.00    Types: Cigarettes  . Smokeless tobacco: Never Used     Comment: stated 1/2-3/4 ppd  . Alcohol use No     Comment: none since moving to nursing home in March 2015    Allergies  Allergen Reactions  . Codeine Nausea Only    Current Outpatient Prescriptions  Medication Sig Dispense Refill  . aspirin EC 81 MG tablet Take 1 tablet (81 mg total) by mouth daily. 150 tablet 2  . cloNIDine (CATAPRES) 0.2 MG tablet Take 0.2 mg by mouth 2 (two) times daily.    . clopidogrel (PLAVIX) 75 MG tablet Take 75 mg by mouth daily. @@ 9:00 pm    . colchicine 0.6 MG tablet Take 0.3 mg by mouth daily.     . Fluticasone-Salmeterol (ADVAIR) 250-50 MCG/DOSE AEPB Inhale 1 puff into the lungs 2 (two) times daily.    . furosemide (LASIX) 20 MG tablet Take 20 mg by mouth daily. Sunday, Tuesday, Thursday, Saturday    . furosemide (LASIX) 40 MG tablet Take 40 mg by mouth daily. Mon,Wed,Fri    . isosorbide mononitrate (IMDUR) 30 MG 24 hr tablet Take 1 tablet (30 mg total) by mouth daily. 30 tablet 5  .  lisinopril (PRINIVIL,ZESTRIL) 40 MG tablet Take 40 mg by mouth daily. Reported on 11/22/2015    . metoprolol tartrate (LOPRESSOR) 25 MG tablet Take 1 tablet (25 mg total) by mouth 2 (two) times daily. 60 tablet 5  . oxyCODONE (ROXICODONE) 15 MG immediate release tablet Take one tablet by mouth every 4 hours as needed for pain 180 tablet 0  . pantoprazole (PROTONIX) 40 MG tablet Take 40 mg by mouth daily.    . potassium chloride SA (K-DUR,KLOR-CON) 20 MEQ tablet Take 20 mEq by mouth daily.     . simvastatin (ZOCOR) 40 MG tablet Take 40 mg by mouth daily.    . temazepam (RESTORIL) 15 MG capsule Take one capsule by mouth at bedtime as needed for insomnia. Do not give after midnight 30 capsule 0  . temazepam (RESTORIL) 30  MG capsule Take one capsule by mouth at bedtime as needed for insomnia (Patient taking differently: Take 15 mg by mouth. Take one capsule by mouth at bedtime as needed for insomnia) 30 capsule 5   No current facility-administered medications for this visit.     REVIEW OF SYSTEMS:  [X]  denotes positive finding, [ ]  denotes negative finding Cardiac  Comments:  Chest pain or chest pressure:    Shortness of breath upon exertion:    Short of breath when lying flat:    Irregular heart rhythm:        Vascular    Pain in calf, thigh, or hip brought on by ambulation:    Pain in feet at night that wakes you up from your sleep:     Blood clot in your veins:    Leg swelling:         Pulmonary    Oxygen at home:    Productive cough:     Wheezing:         Neurologic    Sudden weakness in arms or legs:     Sudden numbness in arms or legs:     Sudden onset of difficulty speaking or slurred speech:    Temporary loss of vision in one eye:     Problems with dizziness:         Gastrointestinal    Blood in stool:     Vomited blood:         Genitourinary    Burning when urinating:     Blood in urine:        Psychiatric    Major depression:         Hematologic    Bleeding problems:    Problems with blood clotting too easily:        Skin    Rashes or ulcers:        Constitutional    Fever or chills:      PHYSICAL EXAM: Vitals:   10/09/16 1525  BP: 130/78  Pulse: 60  Resp: 18  Temp: 98.8 F (37.1 C)  SpO2: 97%  Weight: 211 lb (95.7 kg)  Height: 5\' 8"  (1.727 m)    GENERAL: The patient is a well-nourished male, in no acute distress. The vital signs are documented above. VASCULAR: Left leg venous stasis ulcer healed. Left leg with dependent rubor. No other wounds seen. Non palpable left pedal pulses. Right AKA.  PULMONARY: Non labored respiratory effort.  MUSCULOSKELETAL: Right AKA NEUROLOGIC: No focal deficits.  SKIN: There are no ulcers or rashes noted. PSYCHIATRIC: The  patient has a normal affect.  MEDICAL ISSUES: Chronic venous insufficiency  The patient's  left venous stasis ulcer has now healed after 4 weeks of unna boot therapy. Will place him in compression stocking today and advised him to elevate his leg when sitting. He has an appointment in May with [facility] to follow up on carotid stenosis and ABIs. He knows to call us before then if he has any issues.   Virgina Jock, PA-C [facility] Ve[facility] Specialists of [facility] MD: Oneida Alar

## 2016-10-23 ENCOUNTER — Encounter: Payer: Self-pay | Admitting: Internal Medicine

## 2016-10-23 ENCOUNTER — Non-Acute Institutional Stay (SKILLED_NURSING_FACILITY): Payer: Medicare Other | Admitting: Internal Medicine

## 2016-10-23 DIAGNOSIS — G4700 Insomnia, unspecified: Secondary | ICD-10-CM | POA: Diagnosis not present

## 2016-10-23 DIAGNOSIS — I2510 Atherosclerotic heart disease of native coronary artery without angina pectoris: Secondary | ICD-10-CM

## 2016-10-23 DIAGNOSIS — F17200 Nicotine dependence, unspecified, uncomplicated: Secondary | ICD-10-CM | POA: Diagnosis not present

## 2016-10-23 DIAGNOSIS — I739 Peripheral vascular disease, unspecified: Secondary | ICD-10-CM | POA: Diagnosis not present

## 2016-10-23 DIAGNOSIS — I10 Essential (primary) hypertension: Secondary | ICD-10-CM

## 2016-10-23 DIAGNOSIS — J42 Unspecified chronic bronchitis: Secondary | ICD-10-CM

## 2016-10-23 DIAGNOSIS — E785 Hyperlipidemia, unspecified: Secondary | ICD-10-CM | POA: Diagnosis not present

## 2016-10-23 NOTE — Progress Notes (Signed)
Location:   [place] Room Number: 107/W Place of Service:  SNF [phone]) Provider:  Clydene Fake, MD  Patient Care Team: [name], MD as PCP - General (Internal Medicine)  Extended Emergency Contact Information Primary Emergency Contact: [place], [place] [address] [place] Mobile Phone: [phone] Relation: Daughter  Code Status:  Full Code Goals of care: Advanced Directive information Advanced Directives 10/23/2016  Does Patient Have a Medical Advance Directive? Yes  Type of Advance Directive (No Data)  Does patient want to make changes to medical advance directive? No - Patient declined  Copy of [place] in Chart? -  Would patient like information on creating a medical advance directive? -  Pre-existing out of facility DNR order (yellow form or pink MOST form) -     Chief Complaint  Patient presents with  . Medical Management of Chronic Issues    Routine Visit    HPI:  Pt is a 61 y.o. male seen today for medical management of chronic diseases.    Patient has H/O Severe Peripheral Artery disease,S/P Right AKA. S/P Left femoral Endarterectomy 02/09/16    , Hypertension , CAD s/p PTCA, CHF, Hyperlipidemia, COPD, Continuous to smoke, Carotid stenosis. And Arthritis on Chronic Narcotics.  Patient had been following up with Vascular surgeon for Venous stasis Ulcer . He was placed on [employer] and he has done well with Ulcer completely healed. He is not in [employer] anymore. He continues to follow with them for his PAD. Both ABI and Carotid Stenosis. He Continues to have chronic Cough but no Sputum. No SOB Fever or chills. Patient continues to smoke and refuses to quit. He also refuses Nebs. Patient gets around with his wheel chair. His only complain is that he is not sleeping since they have reduced his Restoril to 15 mg. His weight is stable at 211 lbs.    Past Medical  History:  Diagnosis Date  . Anemia, unspecified   . Arthritis    "shoulders" (09/22/2014)  . Asthma   . Atrial fibrillation ([place])   . Carotid artery occlusion   . CHF (congestive heart failure) ([place])   . COPD 11/27/2007   Qualifier: Diagnosis of  By: Garen Grams    . Coronary artery disease    Cardiac catheterization in 2008 showed 99% stenosis in proximal left circumflex which was treated with Taxus drug-eluting stent. There was mild RCA and LAD disease with normal ejection fraction.  Marland Kitchen ERECTILE DYSFUNCTION 11/27/2007   Qualifier: Diagnosis of  By: Garen Grams    . Gastric ulcer, unspecified as acute or chronic, without mention of hemorrhage, perforation, or obstruction   . Heart murmur   . HYPERLIPIDEMIA 11/27/2007   Qualifier: Diagnosis of  By: Garen Grams    . Hypertension   . HYPERTENSION, BENIGN 05/23/2009   Qualifier: Diagnosis of  By: Melvyn Novas MD, Christena Deem   . Other severe protein-calorie malnutrition   . PAD (peripheral artery disease) (HCC)    Previous right SFA stent in 2003. Directional atherectomy right SFA in 01/2013  . Pneumonia 2000  . Pressure ulcer, lower back(707.03)   . Tobacco use   . Traumatic amputation of leg(s) (complete) (partial), unilateral, at or above knee, without mention of complication    Past Surgical History:  Procedure Laterality Date  . ABDOMINAL AORTAGRAM N/A 02/23/2013   Procedure: ABDOMINAL Maxcine Ham;  Surgeon: Mertie Clause  Fletcher Anon, MD;  Location: [facility] CATH LAB;  Service: Cardiovascular;  Laterality: N/A;  . ABDOMINAL AORTAGRAM N/A 09/22/2014   Procedure: ABDOMINAL Maxcine Ham;  Surgeon: Elam Dutch, MD;  Location: [facility] CATH LAB;  Service: Cardiovascular;  Laterality: N/A;  . ACNE CYST REMOVAL    . AMPUTATION Right 09/09/2013   Procedure: AMPUTATION ABOVE KNEE;  Surgeon: Rosetta Posner, MD;  Location: [place];  Service: Vascular;  Laterality: Right;  . ATHERECTOMY N/A 03/02/2013   Procedure: ATHERECTOMY;  Surgeon: Wellington Hampshire, MD;  Location:  [facility] CATH LAB;  Service: Cardiovascular;  Laterality: N/A;  . CARDIAC CATHETERIZATION N/A 09/03/2015   Procedure: Left Heart Cath and Coronary Angiography;  Surgeon: Leonie Man, MD;  Location: [place] CV LAB;  Service: Cardiovascular;  Laterality: N/A;  . CARDIAC CATHETERIZATION N/A 09/03/2015   Procedure: Coronary Balloon Angioplasty;  Surgeon: Leonie Man, MD;  Location: [place] CV LAB;  Service: Cardiovascular;  Laterality: N/A;  . CORONARY ANGIOPLASTY  09/03/2015  . ENDARTERECTOMY Left 08/16/2013   Procedure: Exploration of Left Neck/Fix Bleeding;  Surgeon: Elam Dutch, MD;  Location: [place];  Service: Vascular;  Laterality: Left;  . ENDARTERECTOMY FEMORAL Left 01/30/2016   Procedure: ENDARTERECTOMY LEFT FEMORAL ARTERY;  Surgeon: Elam Dutch, MD;  Location[facility] OR;  Service: Vascular;  Laterality: Left;  . ESOPHAGOGASTRODUODENOSCOPY N/A 09/08/2013   Procedure: ESOPHAGOGASTRODUODENOSCOPY (EGD);  Surgeon: Cleotis Nipper, MD;  Location: [facility] ENDOSCOPY;  Service: Endoscopy;  Laterality: N/A;  . ESOPHAGOGASTRODUODENOSCOPY (EGD) WITH PROPOFOL N/A 01/05/2014   Procedure: ESOPHAGOGASTRODUODENOSCOPY (EGD) WITH PROPOFOL;  Surgeon: Cleotis Nipper, MD;  Location: [facility] ENDOSCOPY;  Service: Endoscopy;  Laterality: N/A;  . FEMORAL ARTERY STENT Right 2003   [name] 09/22/2014  . FLEXIBLE SIGMOIDOSCOPY N/A 09/08/2013   Procedure: FLEXIBLE SIGMOIDOSCOPY;  Surgeon: Cleotis Nipper, MD;  Location: [facility] ENDOSCOPY;  Service: Endoscopy;  Laterality: N/A;  unprepp  . ILIAC ARTERY STENT Left 09/22/2014  . NASAL SINUS SURGERY  2004  . PAROTIDECTOMY Right 03/29/2014   Procedure: PAROTIDECTOMY;  Surgeon: Ascencion Dike, MD;  Location: [place];  Service: ENT;  Laterality: Right;  . PATCH ANGIOPLASTY Left 01/30/2016   Procedure: LEFT FEMORAL ARTYERY PATCH ANGIOPLASTY USING HEMASHIELD PLATINUM FINESSE PATCH;  Surgeon: Elam Dutch, MD;  Location: [place];  Service: Vascular;  Laterality: Left;  . PERIPHERAL VASCULAR  CATHETERIZATION N/A 08/17/2015   Procedure: Abdominal Aortogram;  Surgeon: Elam Dutch, MD;  Location: [place] CV LAB;  Service: Cardiovascular;  Laterality: N/A;    Allergies  Allergen Reactions  . Codeine Nausea Only    Allergies as of 10/23/2016      Reactions   Codeine Nausea Only      Medication List       Accurate as of 10/23/16  1:01 PM. Always use your most recent med list.          aspirin EC 81 MG tablet Take 1 tablet (81 mg total) by mouth daily.   cloNIDine 0.2 MG tablet Commonly known as:  CATAPRES Take 0.2 mg by mouth 2 (two) times daily.   clopidogrel 75 MG tablet Commonly known as:  PLAVIX Take 75 mg by mouth daily. @@ 9:00 pm   colchicine 0.6 MG tablet Take 0.3 mg by mouth daily.   Fluticasone-Salmeterol 250-50 MCG/DOSE Aepb Commonly known as:  ADVAIR Inhale 1 puff into the lungs 2 (two) times daily.   furosemide 20 MG tablet Commonly known as:  LASIX Take 20 mg by mouth  daily. Sunday, Tuesday, Thursday, Saturday   furosemide 40 MG tablet Commonly known as:  LASIX Take 40 mg by mouth daily. Mon,Wed,Fri   isosorbide mononitrate 30 MG 24 hr tablet Commonly known as:  IMDUR Take 1 tablet (30 mg total) by mouth daily.   lisinopril 40 MG tablet Commonly known as:  PRINIVIL,ZESTRIL Take 40 mg by mouth daily. Reported on 11/22/2015   metoprolol tartrate 25 MG tablet Commonly known as:  LOPRESSOR Take 1 tablet (25 mg total) by mouth 2 (two) times daily.   oxyCODONE 15 MG immediate release tablet Commonly known as:  ROXICODONE Take one tablet by mouth every 4 hours as needed for pain   pantoprazole 40 MG tablet Commonly known as:  PROTONIX Take 40 mg by mouth daily.   potassium chloride SA 20 MEQ tablet Commonly known as:  K-DUR,KLOR-CON Take 20 mEq by mouth daily.   simvastatin 40 MG tablet Commonly known as:  ZOCOR Take 40 mg by mouth daily.   temazepam 15 MG capsule Commonly known as:  RESTORIL Take one capsule by mouth at  bedtime as needed for insomnia. Do not give after midnight       Review of Systems  Review of Systems  Constitutional: Negative for activity change, appetite change, chills, diaphoresis, fatigue and fever.  HENT: Negative for mouth sores, postnasal drip, rhinorrhea, sinus pain and sore throat.   Respiratory: Negative for apnea,  chest tightness, shortness of breath and wheezing.   Cardiovascular: Negative for chest pain, palpitations and leg swelling.  Gastrointestinal: Negative for abdominal distention, abdominal pain, constipation, diarrhea, nausea and vomiting.  Genitourinary: Negative for dysuria and frequency.  Musculoskeletal: Negative for arthralgias, joint swelling and myalgias.  Skin: Negative for rash.  Neurological: Negative for dizziness, syncope, weakness, light-headedness and numbness.  Psychiatric/Behavioral: Negative for behavioral problems, confusion   Immunization History  Administered Date(s) Administered  . DTP 05/28/2001  . Influenza-Unspecified 05/26/2013, 05/04/2014, 04/29/2016  . Pneumococcal-Unspecified 07/28/2009, 05/06/2016  . Tdap 12/30/2012   Pertinent  Health Maintenance Due  Topic Date Due  . COLONOSCOPY  11/22/2016 (Originally 11/24/2006)  . INFLUENZA VACCINE  Completed   Fall Risk  12/30/2012  Falls in the past year? No   Functional Status Survey:    Vitals:   10/23/16 1251  BP: 108/64  Pulse: (!) 51  Resp: 20  Temp: 97.8 F (36.6 C)  TempSrc: Oral  SpO2: 97%   There is no height or weight on file to calculate BMI. Physical Exam  Constitutional: He is oriented to person, place, and time. He appears well-developed and well-nourished.  HENT:  Head: Normocephalic.  Mouth/Throat: Oropharynx is clear and moist.  Eyes: Pupils are equal, round, and reactive to light.  Neck: Neck supple.  Cardiovascular: Normal rate and regular rhythm.   Pulmonary/Chest: Effort normal. He has wheezes.  Has expiratory Wheezing B/L  Abdominal: Soft. Bowel  sounds are normal. He exhibits no distension. There is no tenderness. There is no rebound.  Neurological: He is alert and oriented to person, place, and time.  Skin: Skin is warm and dry.  His Leg looks much better with healed Ulcer and Blisters almost disappear  Psychiatric: He has a normal mood and affect. His behavior is normal. Thought content normal.    Labs reviewed:  Recent Labs  05/09/16 0500 06/02/16 0600 07/22/16 0700  NA 131* 135 135  K 4.2 3.9 3.7  CL 93* 96* 96*  CO2 30 30 30   GLUCOSE 123* 120* 144*  BUN 25* 31* 33*  CREATININE 1.72* 1.71* 1.73*  CALCIUM 8.7* 8.8* 8.9    Recent Labs  01/17/16 0145 01/30/16 0739  AST 21 18  ALT 16* 12*  ALKPHOS 62 52  BILITOT 0.8 0.5  PROT 7.5 6.1*  ALBUMIN 4.1 3.4*    Recent Labs  01/31/16 0530 05/09/16 0500 07/22/16 0700  WBC 7.7 6.5 6.4  NEUTROABS  --   --  3.8  HGB 13.9 14.3 15.1  HCT 46.2 43.4 46.6  MCV 94.1 91.0 90.8  PLT 181 179 154   Lab Results  Component Value Date   TSH 3.881 09/13/2013   Lab Results  Component Value Date   HGBA1C 5.7 (H) 03/28/2016   Lab Results  Component Value Date   CHOL 127 01/17/2016   HDL 25 (L) 01/17/2016   LDLCALC 67 01/17/2016   TRIG 177 (H) 01/17/2016   CHOLHDL 5.1 01/17/2016    Significant Diagnostic Results in last 30 days:  No results found.  Assessment/Plan  PAD (peripheral artery disease)  Continue on Aspirin and Plavix. Also on statin with LDL of 67 Follow up with Vascular surgery for follow up.  Chronic bronchitis, Patient continues to smoke and has chronic cough with wheezing. He refuses for any nebs. He said I am fine and do not want any more meds  Essential hypertension Patient BP has been running on lower side. Also has Bradycardia mostly eith HR less then 55. Will Change his Metoprolol to 25 mg QD. He is on Lisinopril, Clonidine, and Lasix.  Hyperlipidemia  Continue on Statin. Repeat Labs  Insomnia Will d/w pharmacy to see if we can use  Restoril 30 mg  Current smoker Continues to refuse to quit.  Coronary artery disease  Continue on aspirin and isosorbide.  Chronic Kidney disease.Stage 3 Creat stable at 7.35 Chronic diastolic congestive heart failure  Patient stable on Lasix. His Echo in 02/15 was 55 %  Family/ staff Communication:   Labs/tests ordered:  CBC, CMP,TSH,Fasting Lipid Profile.

## 2016-10-24 ENCOUNTER — Other Ambulatory Visit (HOSPITAL_COMMUNITY)
Admission: AD | Admit: 2016-10-24 | Discharge: 2016-10-24 | Disposition: A | Discharge status: Home / Self Care | Payer: Medicare Other | Source: Skilled Nursing Facility | Attending: Internal Medicine | Admitting: Internal Medicine

## 2016-10-24 DIAGNOSIS — I739 Peripheral vascular disease, unspecified: Secondary | ICD-10-CM | POA: Diagnosis not present

## 2016-10-24 DIAGNOSIS — I10 Essential (primary) hypertension: Secondary | ICD-10-CM | POA: Insufficient documentation

## 2016-10-24 DIAGNOSIS — K259 Gastric ulcer, unspecified as acute or chronic, without hemorrhage or perforation: Secondary | ICD-10-CM | POA: Diagnosis not present

## 2016-10-24 DIAGNOSIS — Z89511 Acquired absence of right leg below knee: Secondary | ICD-10-CM | POA: Diagnosis not present

## 2016-10-24 DIAGNOSIS — G4700 Insomnia, unspecified: Secondary | ICD-10-CM | POA: Insufficient documentation

## 2016-10-24 DIAGNOSIS — J449 Chronic obstructive pulmonary disease, unspecified: Secondary | ICD-10-CM | POA: Diagnosis not present

## 2016-10-24 LAB — CBC
HCT: 43.9 % (ref 39.0–52.0)
Hemoglobin: 14.6 g/dL (ref 13.0–17.0)
MCH: 29.7 pg (ref 26.0–34.0)
MCHC: 33.3 g/dL (ref 30.0–36.0)
MCV: 89.4 fL (ref 78.0–100.0)
Platelets: 135 10*3/uL — ABNORMAL LOW (ref 150–400)
RBC: 4.91 MIL/uL (ref 4.22–5.81)
RDW: 15.3 % (ref 11.5–15.5)
WBC: 7.9 10*3/uL (ref 4.0–10.5)

## 2016-10-24 LAB — COMPREHENSIVE METABOLIC PANEL
ALBUMIN: 4 g/dL (ref 3.5–5.0)
ALT: 13 U/L — ABNORMAL LOW (ref 17–63)
AST: 18 U/L (ref 15–41)
Alkaline Phosphatase: 51 U/L (ref 38–126)
Anion gap: 8 (ref 5–15)
BUN: 31 mg/dL — AB (ref 6–20)
CHLORIDE: 96 mmol/L — AB (ref 101–111)
CO2: 28 mmol/L (ref 22–32)
Calcium: 9.1 mg/dL (ref 8.9–10.3)
Creatinine, Ser: 1.64 mg/dL — ABNORMAL HIGH (ref 0.61–1.24)
GFR calc Af Amer: 51 mL/min — ABNORMAL LOW (ref >60)
GFR, EST NON AFRICAN AMERICAN: 44 mL/min — AB (ref >60)
GLUCOSE: 127 mg/dL — AB (ref 65–99)
POTASSIUM: 4.3 mmol/L (ref 3.5–5.1)
Sodium: 132 mmol/L — ABNORMAL LOW (ref 135–145)
Total Bilirubin: 0.7 mg/dL (ref 0.3–1.2)
Total Protein: 6.8 g/dL (ref 6.5–8.1)

## 2016-10-24 LAB — TSH: TSH: 4.446 u[IU]/mL (ref 0.350–4.500)

## 2016-10-24 LAB — LIPID PANEL
Cholesterol: 135 mg/dL (ref 0–200)
HDL: 29 mg/dL — ABNORMAL LOW (ref >40)
LDL Cholesterol: 69 mg/dL (ref 0–99)
Total CHOL/HDL Ratio: 4.7 RATIO
Triglycerides: 186 mg/dL — ABNORMAL HIGH (ref <150)
VLDL: 37 mg/dL (ref 0–40)

## 2016-10-25 LAB — HEMOGLOBIN A1C
HEMOGLOBIN A1C: 5.6 % (ref 4.8–5.6)
MEAN PLASMA GLUCOSE: 114 mg/dL

## 2016-10-27 ENCOUNTER — Other Ambulatory Visit: Payer: Self-pay | Admitting: *Deleted

## 2016-10-27 MED ORDER — TEMAZEPAM 15 MG PO CAPS: ORAL_CAPSULE | ORAL | 0 refills | Status: DC

## 2016-10-27 NOTE — Telephone Encounter (Signed)
[facility] Nursing #[phone] Fax: [phone]   

## 2016-11-04 ENCOUNTER — Encounter (HOSPITAL_COMMUNITY)
Admission: AD | Admit: 2016-11-04 | Discharge: 2016-11-04 | Disposition: A | Discharge status: Home / Self Care | Payer: Medicare Other | Source: Skilled Nursing Facility | Attending: Internal Medicine | Admitting: Internal Medicine

## 2016-11-04 DIAGNOSIS — K259 Gastric ulcer, unspecified as acute or chronic, without hemorrhage or perforation: Secondary | ICD-10-CM | POA: Diagnosis not present

## 2016-11-04 DIAGNOSIS — I739 Peripheral vascular disease, unspecified: Secondary | ICD-10-CM | POA: Diagnosis not present

## 2016-11-04 DIAGNOSIS — Z89511 Acquired absence of right leg below knee: Secondary | ICD-10-CM | POA: Insufficient documentation

## 2016-11-04 DIAGNOSIS — J449 Chronic obstructive pulmonary disease, unspecified: Secondary | ICD-10-CM | POA: Diagnosis not present

## 2016-11-04 DIAGNOSIS — G4700 Insomnia, unspecified: Secondary | ICD-10-CM | POA: Insufficient documentation

## 2016-11-04 LAB — BASIC METABOLIC PANEL
Anion gap: 7 (ref 5–15)
BUN: 25 mg/dL — ABNORMAL HIGH (ref 6–20)
CALCIUM: 9.9 mg/dL (ref 8.9–10.3)
CHLORIDE: 97 mmol/L — AB (ref 101–111)
CO2: 34 mmol/L — AB (ref 22–32)
CREATININE: 1.62 mg/dL — AB (ref 0.61–1.24)
GFR calc non Af Amer: 45 mL/min — ABNORMAL LOW (ref >60)
GFR, EST AFRICAN AMERICAN: 52 mL/min — AB (ref >60)
Glucose, Bld: 105 mg/dL — ABNORMAL HIGH (ref 65–99)
Potassium: 4.9 mmol/L (ref 3.5–5.1)
Sodium: 138 mmol/L (ref 135–145)

## 2016-11-11 ENCOUNTER — Encounter (HOSPITAL_COMMUNITY)
Admission: RE | Admit: 2016-11-11 | Discharge: 2016-11-11 | Disposition: A | Discharge status: Home / Self Care | Payer: Medicare Other | Source: Skilled Nursing Facility | Attending: Internal Medicine | Admitting: Internal Medicine

## 2016-11-11 DIAGNOSIS — K259 Gastric ulcer, unspecified as acute or chronic, without hemorrhage or perforation: Secondary | ICD-10-CM | POA: Insufficient documentation

## 2016-11-11 DIAGNOSIS — J449 Chronic obstructive pulmonary disease, unspecified: Secondary | ICD-10-CM | POA: Diagnosis not present

## 2016-11-11 DIAGNOSIS — I739 Peripheral vascular disease, unspecified: Secondary | ICD-10-CM | POA: Insufficient documentation

## 2016-11-11 DIAGNOSIS — G4700 Insomnia, unspecified: Secondary | ICD-10-CM | POA: Diagnosis not present

## 2016-11-11 DIAGNOSIS — Z89511 Acquired absence of right leg below knee: Secondary | ICD-10-CM | POA: Insufficient documentation

## 2016-11-11 LAB — CBC WITH DIFFERENTIAL/PLATELET
Basophils Absolute: 0.1 10*3/uL (ref 0.0–0.1)
Basophils Relative: 1 %
EOS PCT: 2 %
Eosinophils Absolute: 0.2 10*3/uL (ref 0.0–0.7)
HCT: 48.6 % (ref 39.0–52.0)
Hemoglobin: 16.2 g/dL (ref 13.0–17.0)
LYMPHS ABS: 2.3 10*3/uL (ref 0.7–4.0)
LYMPHS PCT: 33 %
MCH: 30.2 pg (ref 26.0–34.0)
MCHC: 33.3 g/dL (ref 30.0–36.0)
MCV: 90.7 fL (ref 78.0–100.0)
MONO ABS: 0.5 10*3/uL (ref 0.1–1.0)
Monocytes Relative: 7 %
Neutro Abs: 4 10*3/uL (ref 1.7–7.7)
Neutrophils Relative %: 57 %
PLATELETS: 170 10*3/uL (ref 150–400)
RBC: 5.36 MIL/uL (ref 4.22–5.81)
RDW: 15.2 % (ref 11.5–15.5)
WBC: 7 10*3/uL (ref 4.0–10.5)

## 2016-11-11 LAB — BASIC METABOLIC PANEL
Anion gap: 9 (ref 5–15)
BUN: 29 mg/dL — AB (ref 6–20)
CO2: 28 mmol/L (ref 22–32)
Calcium: 9 mg/dL (ref 8.9–10.3)
Chloride: 96 mmol/L — ABNORMAL LOW (ref 101–111)
Creatinine, Ser: 1.6 mg/dL — ABNORMAL HIGH (ref 0.61–1.24)
GFR calc Af Amer: 53 mL/min — ABNORMAL LOW (ref >60)
GFR calc non Af Amer: 46 mL/min — ABNORMAL LOW (ref >60)
GLUCOSE: 79 mg/dL (ref 65–99)
Potassium: 4 mmol/L (ref 3.5–5.1)
Sodium: 133 mmol/L — ABNORMAL LOW (ref 135–145)

## 2016-11-19 ENCOUNTER — Other Ambulatory Visit: Payer: Self-pay

## 2016-11-19 MED ORDER — OXYCODONE HCL 15 MG PO TABS
ORAL_TABLET | ORAL | 0 refills | Status: DC
Start: 2016-11-19 — End: 2016-11-24

## 2016-11-19 NOTE — Telephone Encounter (Signed)
Rx faxed to [facility] at [phone].   [address] [place], [place] [address]  Phone #: [phone] 

## 2016-11-20 ENCOUNTER — Encounter: Payer: Self-pay | Admitting: Family

## 2016-11-24 ENCOUNTER — Other Ambulatory Visit: Payer: Self-pay

## 2016-11-24 ENCOUNTER — Non-Acute Institutional Stay (SKILLED_NURSING_FACILITY): Payer: Medicare Other | Admitting: Internal Medicine

## 2016-11-24 ENCOUNTER — Encounter: Payer: Self-pay | Admitting: Internal Medicine

## 2016-11-24 DIAGNOSIS — M19011 Primary osteoarthritis, right shoulder: Secondary | ICD-10-CM | POA: Diagnosis not present

## 2016-11-24 DIAGNOSIS — I739 Peripheral vascular disease, unspecified: Secondary | ICD-10-CM | POA: Diagnosis not present

## 2016-11-24 DIAGNOSIS — I10 Essential (primary) hypertension: Secondary | ICD-10-CM | POA: Diagnosis not present

## 2016-11-24 DIAGNOSIS — I5032 Chronic diastolic (congestive) heart failure: Secondary | ICD-10-CM | POA: Diagnosis not present

## 2016-11-24 DIAGNOSIS — J42 Unspecified chronic bronchitis: Secondary | ICD-10-CM | POA: Diagnosis not present

## 2016-11-24 DIAGNOSIS — N289 Disorder of kidney and ureter, unspecified: Secondary | ICD-10-CM | POA: Diagnosis not present

## 2016-11-24 MED ORDER — OXYCODONE HCL 15 MG PO TABS: ORAL_TABLET | ORAL | 0 refills | Status: DC

## 2016-11-24 NOTE — Progress Notes (Signed)
Location:   [facility] Room Number: 107/W Place of Service:  SNF (31) Provider:  Freddi Starr, MD  Patient Care Team: [name], MD as PCP - General (Internal Medicine)  Extended Emergency Contact Information Primary Emergency Contact: [place], [place] [address] [place] Mobile Phone: [phone] Relation: Daughter  Code Status:  Full Code Goals of care: Advanced Directive information Advanced Directives 11/24/2016  Does Patient Have a Medical Advance Directive? Yes  Type of Advance Directive (No Data)  Does patient want to make changes to medical advance directive? No - Patient declined  Copy of [place] in Chart? -  Would patient like information on creating a medical advance directive? -  Pre-existing out of facility DNR order (yellow form or pink MOST form) -     Chief Complaint  Patient presents with  . Medical Management of Chronic Issues    Routine Visit  Medical management of chronic medical issues including peripheral arterial disease-hypertension-CHF-COPD-osteoarthritis-runny kidney disease-gout-  HPI:  Pt is a 61 y.o. male seen today for medical management of chronic diseases. As noted above  He appears to have a. If stability.  He has been followed with vascular surgeon for venous stasis ulcer that appears to have resolved status post Unna boot.  He continues on aspirin and Plavix as well as a statin he is status post right above-the-knee amputation.  He does have a history hypertension but it appears current medications including clonidine lisinopril and Lopressor have led daily. If stability here recent blood pressures 140/72-136/80-118/80-the highest one that I see is 166/90 and this is quite rare.  Does have a history of COPD continues to smoke -he continues on Advair has some chronic congested breath sounds with these appear to be stable he does not complain  of any increased shortness of breath.  In regards to history CHF he is on Lasix alternating 20 and 40 mg a day with potassium supplementation he has gained about 5-10 pounds over the past month or 2 there are variable weights here-he says this is from his appetite he says he eats "too well"-edema appears to be at baseline he does not complain of any increased cough beyond baseline-or any shortness of breath  Note he does have a distant history of a GI bleed thought possibly secondary to NSAID use he is on Protonix is less has been stable for some time hemoglobin in mid April 2018 was 16.2 which show stability.  He also has a history of a partial parathyroidectomy in the past thought to be an adenoma-with clear margins-follow-up was arranged but patient stated he did not think he needed follow up  Clinically he does appear to be stable in this regards.  Regards chronic kidney disease recent creatinine 1.6 appears to be baseline this has been somewhat challenging with his history of edema with Lasix use but nonetheless appears to have stabilized.  He continues on Restoril 30 mg daily at bedtime for insomnia this was recently increased by Dr. Lyndel Safe secondary to patient's concerns that he really needed that dose to be effective-there's been no daytime sedation noted.  Regards to history carotid stenosis he continues on aspirin.  Currently he is sitting in his wheelchair comfortably continues to be pleasant and appropriate he does not really have any complaints today.  Does have a history of a left knee rash which has been persistent for a  while-apparently at improves and then reoccurs-she did have a dermatology consult but apparently there were issues with private pay for this-phe has deferred for now.     Past Medical History:  Diagnosis Date  . Anemia, unspecified   . Arthritis    "shoulders" (09/22/2014)  . Asthma   . Atrial fibrillation ([facility])   . Carotid artery occlusion   . CHF  (congestive heart failure) ([facility])   . COPD 11/27/2007   Qualifier: Diagnosis of  By: Garen Grams    . Coronary artery disease    Cardiac catheterization in 2008 showed 99% stenosis in proximal left circumflex which was treated with Taxus drug-eluting stent. There was mild RCA and LAD disease with normal ejection fraction.  Marland Kitchen ERECTILE DYSFUNCTION 11/27/2007   Qualifier: Diagnosis of  By: Garen Grams    . Gastric ulcer, unspecified as acute or chronic, without mention of hemorrhage, perforation, or obstruction   . Heart murmur   . HYPERLIPIDEMIA 11/27/2007   Qualifier: Diagnosis of  By: Garen Grams    . Hypertension   . HYPERTENSION, BENIGN 05/23/2009   Qualifier: Diagnosis of  By: Melvyn Novas MD, Christena Deem   . Other severe protein-calorie malnutrition   . PAD (peripheral artery disease) (H[facility])    Previous right SFA stent in 2003. Directional atherectomy right SFA in 01/2013  . Pneumonia 2000  . Pressure ulcer, lower back(707.03)   . Tobacco use   . Traumatic amputation of leg(s) (complete) (partial), unilateral, at or above knee, without mention of complication    Past Surgical History:  Procedure Laterality Date  . ABDOMINAL AORTAGRAM N/A 02/23/2013   Procedure: ABDOMINAL Maxcine Ham;  Surgeon: Wellington Hampshire, MD;  Location: [facility] CATH LAB;  Service: Cardiovascular;  Laterality: N/A;  . ABDOMINAL AORTAGRAM N/A 09/22/2014   Procedure: ABDOMINAL Maxcine Ham;  Surgeon: Elam Dutch, MD;  Location: [facility] CATH LAB;  Service: Cardiovascular;  Laterality: N/A;  . ACNE CYST REMOVAL    . AMPUTATION Right 09/09/2013   Procedure: AMPUTATION ABOVE KNEE;  Surgeon: Rosetta Posner, MD;  Location: [facility];  Service: Vascular;  Laterality: Right;  . ATHERECTOMY N/A 03/02/2013   Procedure: ATHERECTOMY;  Surgeon: Wellington Hampshire, MD;  Location: [facility] CATH LAB;  Service: Cardiovascular;  Laterality: N/A;  . CARDIAC CATHETERIZATION N/A 09/03/2015   Procedure: Left Heart Cath and Coronary Angiography;  Surgeon: Leonie Man, MD;  Location: [facility] CV LAB;  Service: Cardiovascular;  Laterality: N/A;  . CARDIAC CATHETERIZATION N/A 09/03/2015   Procedure: Coronary Balloon Angioplasty;  Surgeon: Leonie Man, MD;  Location: [facility] CV LAB;  Service: Cardiovascular;  Laterality: N/A;  . CORONARY ANGIOPLASTY  09/03/2015  . ENDARTERECTOMY Left 08/16/2013   Procedure: Exploration of Left Neck/Fix Bleeding;  Surgeon: Elam Dutch, MD;  Location: [facility];  Service: Vascular;  Laterality: Left;  . ENDARTERECTOMY FEMORAL Left 01/30/2016   Procedure: ENDARTERECTOMY LEFT FEMORAL ARTERY;  Surgeon: Elam Dutch, MD;  Location: [facility] OR;  Service: Vascular;  Laterality: Left;  . ESOPHAGOGASTRODUODENOSCOPY N/A 09/08/2013   Procedure: ESOPHAGOGASTRODUODENOSCOPY (EGD);  Surgeon: Cleotis Nipper, MD;  Location: [facility] ENDOSCOPY;  Service: Endoscopy;  Laterality: N/A;  . ESOPHAGOGASTRODUODENOSCOPY (EGD) WITH PROPOFOL N/A 01/05/2014   Procedure: ESOPHAGOGASTRODUODENOSCOPY (EGD) WITH PROPOFOL;  Surgeon: Cleotis Nipper, MD;  Location: [facility] ENDOSCOPY;  Service: Endoscopy;  Laterality: N/A;  . FEMORAL ARTERY STENT Right 2003   [name] 09/22/2014  . FLEXIBLE SIGMOIDOSCOPY N/A 09/08/2013   Procedure: FLEXIBLE SIGMOIDOSCOPY;  Surgeon: Cleotis Nipper, MD;  Location: [facility] ENDOSCOPY;  Service: Endoscopy;  Laterality: N/A;  unprepp  . ILIAC ARTERY STENT Left 09/22/2014  . NASAL SINUS SURGERY  2004  . PAROTIDECTOMY Right 03/29/2014   Procedure: PAROTIDECTOMY;  Surgeon: Ascencion Dike, MD;  Location: [facility];  Service: ENT;  Laterality: Right;  . PATCH ANGIOPLASTY Left 01/30/2016   Procedure: LEFT FEMORAL ARTYERY PATCH ANGIOPLASTY USING HEMASHIELD PLATINUM FINESSE PATCH;  Surgeon: Elam Dutch, MD;  Location: [facility];  Service: Vascular;  Laterality: Left;  . PERIPHERAL VASCULAR CATHETERIZATION N/A 08/17/2015   Procedure: Abdominal Aortogram;  Surgeon: Elam Dutch, MD;  Location: [facility] CV LAB;  Service: Cardiovascular;  Laterality: N/A;      Allergies  Allergen Reactions  . Codeine Nausea Only    Allergies as of 11/24/2016      Reactions   Codeine Nausea Only      Medication List       Accurate as of 11/24/16  3:31 PM. Always use your most recent med list.          aspirin EC 81 MG tablet Take 1 tablet (81 mg total) by mouth daily.   cloNIDine 0.2 MG tablet Commonly known as:  CATAPRES Take 0.2 mg by mouth 2 (two) times daily.   clopidogrel 75 MG tablet Commonly known as:  PLAVIX Take 75 mg by mouth daily. @@ 9:00 pm   colchicine 0.6 MG tablet Take 0.3 mg by mouth daily.   Fluticasone-Salmeterol 250-50 MCG/DOSE Aepb Commonly known as:  ADVAIR Inhale 1 puff into the lungs 2 (two) times daily.   furosemide 20 MG tablet Commonly known as:  LASIX Take 20 mg by mouth daily. Sunday, Tuesday, Thursday, Saturday   furosemide 40 MG tablet Commonly known as:  LASIX Take 40 mg by mouth daily. Mon,Wed,Fri   isosorbide mononitrate 30 MG 24 hr tablet Commonly known as:  IMDUR Take 1 tablet (30 mg total) by mouth daily.   lisinopril 40 MG tablet Commonly known as:  PRINIVIL,ZESTRIL Take 40 mg by mouth daily. Reported on 11/22/2015   metoprolol tartrate 25 MG tablet Commonly known as:  LOPRESSOR Take 1 tablet (25 mg total) by mouth 2 (two) times daily.   oxyCODONE 15 MG immediate release tablet Commonly known as:  ROXICODONE Take one tablet by mouth every 4 hours as needed for pain HOLD for sedation, resp depression   pantoprazole 40 MG tablet Commonly known as:  PROTONIX Take 40 mg by mouth daily.   potassium chloride SA 20 MEQ tablet Commonly known as:  K-DUR,KLOR-CON Take 20 mEq by mouth daily.   simvastatin 40 MG tablet Commonly known as:  ZOCOR Take 40 mg by mouth daily.   temazepam 30 MG capsule Commonly known as:  RESTORIL Take 30 mg by mouth at bedtime as needed for sleep.       Review of Systems   In general is not complaining fever or chills appears to have gained some  weight.  Skin is not complaining of itching however does have a somewhat recurrent rash on his left knee.  Head ears eyes nose mouth and throat-does not complain of sore throat visual changes.  Respiratory has somewhat of a chronic cough does continue to smoke he is not complaining of increased shortness of breath from baseline.  Cardiac does not complaining of chest pain has some chronic lower extremity edema has Cozaar on this appears to be relatively stable.  GI is not complaining of abdominal discomfort nausea vomiting diarrhea constipation.  GU  does not complain of dysuria.  Muscle skeletal has chronic osteoarthritic pain of the joints shoulder-this appears relatively baseline.  Neurologic is not complaining of dizziness headache or numbness at this point.  Psych does not complain of depression or anxiety      Immunization History  Administered Date(s) Administered  . DTP 05/28/2001  . Influenza-Unspecified 05/26/2013, 05/04/2014, 04/29/2016  . Pneumococcal-Unspecified 07/28/2009, 05/06/2016  . Tdap 12/30/2012   Pertinent  Health Maintenance Due  Topic Date Due  . COLONOSCOPY  11/24/2006  . INFLUENZA VACCINE  02/25/2017   Fall Risk  12/30/2012  Falls in the past year? No   Functional Status Survey:    Vitals:   11/24/16 1530  BP: 140/72  Pulse: 78  Resp: 20  Temp: 98.7 F (37.1 C)  TempSrc: Oral  Weight is 220 pounds  Physical Exam In general this is a well-nourished middle-aged male in no distress.  His skin is warm and dry-left lower leg patient appears to have somewhat of a rash on his left knee area-although patient does have extensive sun exposure and there could be an element of this as well   Eyes pupils appear reactive light sclera and conjunctiva are clear visual acuity appears grossly intact  Oropharynx is clear mucous membranes moist.  Chest he has some diffuse rhonchi wheezing on expiration there is no labored breathing this is  relatively baseline with  previous exams.  Heart is regular rate and rhythm without murmur gallop or rub he has some mild lower extremity edema which appears baseline. He has compression hose on  Abdomen is  obese soft nontender with positive bowel sounds.  Musculoskeletal is status post  A right above-the-knee amputation -- strength appears preserved in all other extremities.--Continues to ambulate  in wheelchair-actually has gained significant upper extremity strength during his stay here.  Neurologic is grossly intact no lateralizing findings her speech is clear.  Psych he is alert and oriented pleasant and appropriate.  Labs reviewed:  Recent Labs  10/24/16 0212 11/04/16 0700 11/11/16 0730  NA 132* 138 133*  K 4.3 4.9 4.0  CL 96* 97* 96*  CO2 28 34* 28  GLUCOSE 127* 105* 79  BUN 31* 25* 29*  CREATININE 1.64* 1.62* 1.60*  CALCIUM 9.1 9.9 9.0    Recent Labs  01/17/16 0145 01/30/16 0739 10/24/16 0212  AST 21 18 18   ALT 16* 12* 13*  ALKPHOS 62 52 51  BILITOT 0.8 0.5 0.7  PROT 7.5 6.1* 6.8  ALBUMIN 4.1 3.4* 4.0    Recent Labs  07/22/16 0700 10/24/16 0212 11/11/16 0730  WBC 6.4 7.9 7.0  NEUTROABS 3.8  --  4.0  HGB 15.1 14.6 16.2  HCT 46.6 43.9 48.6  MCV 90.8 89.4 90.7  PLT 154 135* 170   Lab Results  Component Value Date   TSH 4.446 10/24/2016   Lab Results  Component Value Date   HGBA1C 5.6 10/24/2016   Lab Results  Component Value Date   CHOL 135 10/24/2016   HDL 29 (L) 10/24/2016   LDLCALC 69 10/24/2016   TRIG 186 (H) 10/24/2016   CHOLHDL 4.7 10/24/2016    Significant Diagnostic Results in last 30 days:  No results found.  Assessment/Plan  1 history of peripheral arterial disease-continues on aspirin Plavix and a statin he is followed by vascular his wound on the left leg appears to have healed-.  #2 hypertension this appears stable as noted above continues on clonidine 0.2 mg twice a day lisinopril 40 mg a  day as well as  Lopressor 12.5 mg twice a day.  CHF with echo in February 2015 showing ejection fraction 87-86%--[facility] 1 diastolic dysfunction --he has gained weight but this does not appear to be edema related he's eating very well he feels he is eating well-she has variable weights at this point will monitor continue current Lasix dose of 40 alternating with 20 mg a day with potassium supplementation metabolic panel on 94/70/9628 showed stability with a creatinine of 1.6. We will update a BNP  #4-COPD patient continues to smoke despite advisement to try to quit-he is on Advair at this point appears stable although certainly would be to his benefit to stop smoking.  #5-history of osteoarthritis with shoulder pain-she is on oxycodone 15 mg every 4 hours when necessary he takes this quite frequently. He does have a history of extensive degenerative changes of the right shoulder.  #6-history of chronic kidney disease as noted above creatinine most recently stable at 1.6 will monitor a periodic intervals.  #7 coronary artery disease at this point appears asymptomatic he is on aspirin Plavix Imdur as well as a statin.  Delorise Jackson-- a history hyperlipidemia continues on a statin LDL was 69 on lab done in 10/24/2016  #9-history of gout he is on colchicine once a day for prophylaxis this appears to have stabilized.  #10 history of insomnia he is on temazepam 30 mg daily at bedtime is quite insistent that he does need this to help him sleep.  #11 history of GI bleed again he is on Protonix of [place] GI bleed may be due to NSAIDs hemoglobin consistently has been stable recently most recent hemoglobin was 16.2 on lab done 11/11/2016.  CPT-is 993[address]-of note greater than 40 minutes spent assessing patient-reviewing his chart-reviewing his labs-and coordinating and formulating a plan of care for numerous diagnoses-of note greater than 50% of time spent coordinating plan of care

## 2016-11-25 ENCOUNTER — Encounter (HOSPITAL_COMMUNITY)
Admission: RE | Admit: 2016-11-25 | Discharge: 2016-11-25 | Disposition: A | Discharge status: Home / Self Care | Payer: Medicare Other | Source: Skilled Nursing Facility | Attending: Internal Medicine | Admitting: Internal Medicine

## 2016-11-25 DIAGNOSIS — J449 Chronic obstructive pulmonary disease, unspecified: Secondary | ICD-10-CM | POA: Insufficient documentation

## 2016-11-25 DIAGNOSIS — Z89511 Acquired absence of right leg below knee: Secondary | ICD-10-CM | POA: Diagnosis not present

## 2016-11-25 DIAGNOSIS — I739 Peripheral vascular disease, unspecified: Secondary | ICD-10-CM | POA: Diagnosis not present

## 2016-11-25 DIAGNOSIS — G4700 Insomnia, unspecified: Secondary | ICD-10-CM | POA: Insufficient documentation

## 2016-11-25 DIAGNOSIS — K259 Gastric ulcer, unspecified as acute or chronic, without hemorrhage or perforation: Secondary | ICD-10-CM | POA: Diagnosis not present

## 2016-11-25 LAB — BRAIN NATRIURETIC PEPTIDE: B NATRIURETIC PEPTIDE 5: 91 pg/mL (ref 0.0–100.0)

## 2016-11-27 ENCOUNTER — Ambulatory Visit (HOSPITAL_COMMUNITY)
Admission: RE | Admit: 2016-11-27 | Discharge: 2016-11-27 | Disposition: A | Discharge status: Home / Self Care | Payer: Medicare Other | Source: Ambulatory Visit | Attending: Family | Admitting: Family

## 2016-11-27 ENCOUNTER — Ambulatory Visit: Payer: Self-pay | Admitting: Family

## 2016-11-27 ENCOUNTER — Ambulatory Visit (INDEPENDENT_AMBULATORY_CARE_PROVIDER_SITE_OTHER)
Admission: RE | Admit: 2016-11-27 | Discharge: 2016-11-27 | Disposition: A | Discharge status: Home / Self Care | Payer: Medicare Other | Source: Ambulatory Visit | Attending: Vascular Surgery | Admitting: Vascular Surgery

## 2016-11-27 DIAGNOSIS — I6523 Occlusion and stenosis of bilateral carotid arteries: Secondary | ICD-10-CM | POA: Insufficient documentation

## 2016-11-27 DIAGNOSIS — I70202 Unspecified atherosclerosis of native arteries of extremities, left leg: Secondary | ICD-10-CM | POA: Diagnosis not present

## 2016-11-27 DIAGNOSIS — I739 Peripheral vascular disease, unspecified: Secondary | ICD-10-CM | POA: Diagnosis not present

## 2016-12-03 ENCOUNTER — Ambulatory Visit (INDEPENDENT_AMBULATORY_CARE_PROVIDER_SITE_OTHER): Payer: Medicare Other | Admitting: Family

## 2016-12-03 ENCOUNTER — Encounter: Payer: Self-pay | Admitting: Family

## 2016-12-03 VITALS — BP 124/76 | HR 69 | Temp 97.0°F | Resp 18 | Ht 68.0 in | Wt 214.0 lb

## 2016-12-03 DIAGNOSIS — Z89611 Acquired absence of right leg above knee: Secondary | ICD-10-CM | POA: Diagnosis not present

## 2016-12-03 DIAGNOSIS — Z9862 Peripheral vascular angioplasty status: Secondary | ICD-10-CM

## 2016-12-03 DIAGNOSIS — F17200 Nicotine dependence, unspecified, uncomplicated: Secondary | ICD-10-CM

## 2016-12-03 DIAGNOSIS — I6523 Occlusion and stenosis of bilateral carotid arteries: Secondary | ICD-10-CM | POA: Diagnosis not present

## 2016-12-03 DIAGNOSIS — I83022 Varicose veins of left lower extremity with ulcer of calf: Secondary | ICD-10-CM | POA: Diagnosis not present

## 2016-12-03 DIAGNOSIS — I739 Peripheral vascular disease, unspecified: Secondary | ICD-10-CM

## 2016-12-03 DIAGNOSIS — L97221 Non-pressure chronic ulcer of left calf limited to breakdown of skin: Secondary | ICD-10-CM

## 2016-12-03 DIAGNOSIS — Z95828 Presence of other vascular implants and grafts: Secondary | ICD-10-CM | POA: Diagnosis not present
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
## 2016-12-03 NOTE — Patient Instructions (Signed)
Peripheral Vascular Disease Peripheral vascular disease (PVD) is a disease of the blood vessels that are not part of your heart and brain. A simple term for PVD is poor circulation. In most cases, PVD narrows the blood vessels that carry blood from your heart to the rest of your body. This can result in a decreased supply of blood to your arms, legs, and internal organs, like your stomach or kidneys. However, it most often affects a person's lower legs and feet. There are two types of PVD.  Organic PVD. This is the more common type. It is caused by damage to the structure of blood vessels.  Functional PVD. This is caused by conditions that make blood vessels contract and tighten (spasm). Without treatment, PVD tends to get worse over time. PVD can also lead to acute ischemic limb. This is when an arm or limb suddenly has trouble getting enough blood. This is a medical emergency. Follow these instructions at home:  Take medicines only as told by your doctor.  Do not use any tobacco products, including cigarettes, chewing tobacco, or electronic cigarettes. If you need help quitting, ask your doctor.  Lose weight if you are overweight, and maintain a healthy weight as told by your doctor.  Eat a diet that is low in fat and cholesterol. If you need help, ask your doctor.  Exercise regularly. Ask your doctor for some good activities for you.  Take good care of your feet.  Wear comfortable shoes that fit well.  Check your feet often for any cuts or sores. Contact a doctor if:  You have cramps in your legs while walking.  You have leg pain when you are at rest.  You have coldness in a leg or foot.  Your skin changes.  You are unable to get or have an erection (erectile dysfunction).  You have cuts or sores on your feet that are not healing. Get help right away if:  Your arm or leg turns cold and blue.  Your arms or legs become red, warm, swollen, painful, or numb.  You have  chest pain or trouble breathing.  You suddenly have weakness in your face, arm, or leg.  You become very confused or you cannot speak.  You suddenly have a very bad headache.  You suddenly cannot see. This information is not intended to replace advice given to you by your health care provider. Make sure you discuss any questions you have with your health care provider. Document Released: 10/08/2009 Document Revised: 12/20/2015 Document Reviewed: 12/22/2013 Elsevier Interactive Patient Education  2017 Elsevier Inc.    Stroke Prevention Some medical conditions and behaviors are associated with an increased chance of having a stroke. You may prevent a stroke by making healthy choices and managing medical conditions. How can I reduce my risk of having a stroke?  Stay physically active. Get at least 30 minutes of activity on most or all days.  Do not smoke. It may also be helpful to avoid exposure to secondhand smoke.  Limit alcohol use. Moderate alcohol use is considered to be:  No more than 2 drinks per day for men.  No more than 1 drink per day for nonpregnant women.  Eat healthy foods. This involves:  Eating 5 or more servings of fruits and vegetables a day.  Making dietary changes that address high blood pressure (hypertension), high cholesterol, diabetes, or obesity.  Manage your cholesterol levels.  Making food choices that are high in fiber and low in saturated fat,   trans fat, and cholesterol may control cholesterol levels.  Take any prescribed medicines to control cholesterol as directed by your health care provider.  Manage your diabetes.  Controlling your carbohydrate and sugar intake is recommended to manage diabetes.  Take any prescribed medicines to control diabetes as directed by your health care provider.  Control your hypertension.  Making food choices that are low in salt (sodium), saturated fat, trans fat, and cholesterol is recommended to manage  hypertension.  Ask your health care provider if you need treatment to lower your blood pressure. Take any prescribed medicines to control hypertension as directed by your health care provider.  If you are 18-39 years of age, have your blood pressure checked every 3-5 years. If you are 40 years of age or older, have your blood pressure checked every year.  Maintain a healthy weight.  Reducing calorie intake and making food choices that are low in sodium, saturated fat, trans fat, and cholesterol are recommended to manage weight.  Stop drug abuse.  Avoid taking birth control pills.  Talk to your health care provider about the risks of taking birth control pills if you are over 35 years old, smoke, get migraines, or have ever had a blood clot.  Get evaluated for sleep disorders (sleep apnea).  Talk to your health care provider about getting a sleep evaluation if you snore a lot or have excessive sleepiness.  Take medicines only as directed by your health care provider.  For some people, aspirin or blood thinners (anticoagulants) are helpful in reducing the risk of forming abnormal blood clots that can lead to stroke. If you have the irregular heart rhythm of atrial fibrillation, you should be on a blood thinner unless there is a good reason you cannot take them.  Understand all your medicine instructions.  Make sure that other conditions (such as anemia or atherosclerosis) are addressed. Get help right away if:  You have sudden weakness or numbness of the face, arm, or leg, especially on one side of the body.  Your face or eyelid droops to one side.  You have sudden confusion.  You have trouble speaking (aphasia) or understanding.  You have sudden trouble seeing in one or both eyes.  You have sudden trouble walking.  You have dizziness.  You have a loss of balance or coordination.  You have a sudden, severe headache with no known cause.  You have new chest pain or an  irregular heartbeat. Any of these symptoms may represent a serious problem that is an emergency. Do not wait to see if the symptoms will go away. Get medical help at once. Call your local emergency services (911 in [place] Do not drive yourself to the hospital. This information is not intended to replace advice given to you by your health care provider. Make sure you discuss any questions you have with your health care provider. Document Released: 08/21/2004 Document Revised: 12/20/2015 Document Reviewed: 01/14/2013 Elsevier Interactive Patient Education  2017 Elsevier Inc.   

## 2016-12-12 NOTE — Addendum Note (Signed)
Addended by: Lianne Cure A on: 12/12/2016 03:57 PM   Modules accepted: Orders

## 2016-12-15 ENCOUNTER — Non-Acute Institutional Stay (SKILLED_NURSING_FACILITY): Payer: Medicare Other | Admitting: Internal Medicine

## 2016-12-15 DIAGNOSIS — N289 Disorder of kidney and ureter, unspecified: Secondary | ICD-10-CM | POA: Diagnosis not present

## 2016-12-15 DIAGNOSIS — L03116 Cellulitis of left lower limb: Secondary | ICD-10-CM | POA: Diagnosis not present

## 2016-12-15 NOTE — Progress Notes (Signed)
This is an acute visit.  Level care skilled.  Facility is [facility].  Chief complaint-acute visit secondary to possible cellulitis left lower leg.  History of present illness.  Patient is a pleasant 61 year old male with a history of peripheral arterial disease-hypertension-CHF-COPD-and chronic kidney disease.  He has been followed with vascular surgeon for venous stasis ulcer that appears to have resolved status post Unna boot.  He continues on aspirin and Plavix as well as a statin he is status post right above-the-knee amputation. He was treated recently treated for cellulitis with blisters of his left lower leg and did complete a course of clindamycin.  He is s/p left femoral endarterectomy by Dr. 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 He had a left EIA stent placed by Dr. Oneida Alar in 2016   Patient is concerned feeling he has some increased erythema with warmth and tenderness of his left lower leg at the site of the previous cellulitis.  He is afebrile he is not complaining of any fever or chills.  Past Medical History:  Diagnosis Date  . Anemia, unspecified   . Arthritis    "shoulders" (09/22/2014)  . Asthma   . Atrial fibrillation ([place])   . Carotid artery occlusion   . CHF (congestive heart failure) ([place])   . COPD 11/27/2007   Qualifier: Diagnosis of  By: Garen Grams    . Coronary artery disease    Cardiac catheterization in 2008 showed 99% stenosis in proximal left circumflex which was treated with Taxus drug-eluting stent. There was mild RCA and LAD disease with normal ejection fraction.  Marland Kitchen ERECTILE DYSFUNCTION 11/27/2007   Qualifier: Diagnosis of  By: Garen Grams    . Gastric ulcer, unspecified as acute or chronic, without mention of hemorrhage, perforation, or obstruction   . Heart murmur   . HYPERLIPIDEMIA 11/27/2007   Qualifier: Diagnosis of  By: Garen Grams    .  Hypertension   . HYPERTENSION, BENIGN 05/23/2009   Qualifier: Diagnosis of  By: Melvyn Novas MD, Christena Deem   . Other severe protein-calorie malnutrition   . PAD (peripheral artery disease) ([facility])    Previous right SFA stent in 2003. Directional atherectomy right SFA in 01/2013  . Pneumonia 2000  . Pressure ulcer, lower back(707.03)   . Tobacco use   . Traumatic amputation of leg(s) (complete) (partial), unilateral, at or above knee, without mention of complication         Past Surgical History:  Procedure Laterality Date  . ABDOMINAL AORTAGRAM N/A 02/23/2013   Procedure: ABDOMINAL Maxcine Ham;  Surgeon: Wellington Hampshire, MD;  Location: [facility] CATH LAB;  Service: Cardiovascular;  Laterality: N/A;  . ABDOMINAL AORTAGRAM N/A 09/22/2014   Procedure: ABDOMINAL Maxcine Ham;  Surgeon: Elam Dutch, MD;  Location: [facility] CATH LAB;  Service: Cardiovascular;  Laterality: N/A;  . ACNE CYST REMOVAL    . AMPUTATION Right 09/09/2013   Procedure: AMPUTATION ABOVE KNEE;  Surgeon: Rosetta Posner, MD;  Location: [facility];  Service: Vascular;  Laterality: Right;  . ATHERECTOMY N/A 03/02/2013   Procedure: ATHERECTOMY;  Surgeon: Wellington Hampshire, MD;  Location: [facility] CATH LAB;  Service: Cardiovascular;  Laterality: N/A;  . CARDIAC CATHETERIZATION N/A 09/03/2015   Procedure: Left Heart Cath and Coronary Angiography;  Surgeon: Leonie Man, MD;  Location: [facility] CV LAB;  Service: Cardiovascular;  Laterality: N/A;  . CARDIAC CATHETERIZATION N/A 09/03/2015   Procedure: Coronary Balloon Angioplasty;  Surgeon: Leonie Man,  MD;  Location: [place] CV LAB;  Service: Cardiovascular;  Laterality: N/A;  . CORONARY ANGIOPLASTY  09/03/2015  . ENDARTERECTOMY Left 08/16/2013   Procedure: Exploration of Left Neck/Fix Bleeding;  Surgeon: Elam Dutch, MD;  Location: [place];  Service: Vascular;  Laterality: Left;  . ENDARTERECTOMY FEMORAL Left 01/30/2016   Procedure: ENDARTERECTOMY LEFT FEMORAL ARTERY;  Surgeon: Elam Dutch, MD;  Location: [facility] OR;  Service: Vascular;  Laterality: Left;  . ESOPHAGOGASTRODUODENOSCOPY N/A 09/08/2013   Procedure: ESOPHAGOGASTRODUODENOSCOPY (EGD);  Surgeon: Cleotis Nipper, MD;  Location: [facility] ENDOSCOPY;  Service: Endoscopy;  Laterality: N/A;  . ESOPHAGOGASTRODUODENOSCOPY (EGD) WITH PROPOFOL N/A 01/05/2014   Procedure: ESOPHAGOGASTRODUODENOSCOPY (EGD) WITH PROPOFOL;  Surgeon: Cleotis Nipper, MD;  Location: [facility] ENDOSCOPY;  Service: Endoscopy;  Laterality: N/A;  . FEMORAL ARTERY STENT Right 2003   [name] 09/22/2014  . FLEXIBLE SIGMOIDOSCOPY N/A 09/08/2013   Procedure: FLEXIBLE SIGMOIDOSCOPY;  Surgeon: Cleotis Nipper, MD;  Location: [facility] ENDOSCOPY;  Service: Endoscopy;  Laterality: N/A;  unprepp  . ILIAC ARTERY STENT Left 09/22/2014  . NASAL SINUS SURGERY  2004  . PAROTIDECTOMY Right 03/29/2014   Procedure: PAROTIDECTOMY;  Surgeon: Ascencion Dike, MD;  Location: [place];  Service: ENT;  Laterality: Right;  . PATCH ANGIOPLASTY Left 01/30/2016   Procedure: LEFT FEMORAL ARTYERY PATCH ANGIOPLASTY USING HEMASHIELD PLATINUM FINESSE PATCH;  Surgeon: Elam Dutch, MD;  Location: [place];  Service: Vascular;  Laterality: Left;  . PERIPHERAL VASCULAR CATHETERIZATION N/A 08/17/2015   Procedure: Abdominal Aortogram;  Surgeon: Elam Dutch, MD;  Location: [place] CV LAB;  Service: Cardiovascular;  Laterality: N/A;        Allergies  Allergen Reactions  . Codeine Nausea Only        Allergies as of 11/24/2016      Reactions   Codeine Nausea Only               Medication List                     aspirin EC 81 MG tablet Take 1 tablet (81 mg total) by mouth daily.   cloNIDine 0.2 MG tablet Commonly known as:  CATAPRES Take 0.2 mg by mouth 2 (two) times daily.   clopidogrel 75 MG tablet Commonly known as:  PLAVIX Take 75 mg by mouth daily. @@ 9:00 pm   colchicine 0.6 MG tablet Take 0.3 mg by mouth daily.   Fluticasone-Salmeterol 250-50 MCG/DOSE  Aepb Commonly known as:  ADVAIR Inhale 1 puff into the lungs 2 (two) times daily.   furosemide 20 MG tablet Commonly known as:  LASIX Take 20 mg by mouth daily. Sunday, Tuesday, Thursday, Saturday   furosemide 40 MG tablet Commonly known as:  LASIX Take 40 mg by mouth daily. Mon,Wed,Fri   isosorbide mononitrate 30 MG 24 hr tablet Commonly known as:  IMDUR Take 1 tablet (30 mg total) by mouth daily.   lisinopril 40 MG tablet Commonly known as:  PRINIVIL,ZESTRIL Take 40 mg by mouth daily. Reported on 11/22/2015   metoprolol tartrate 25 MG tablet Commonly known as:  LOPRESSOR Take 1 tablet (25 mg total) by mouth 2 (two) times daily.   oxyCODONE 15 MG immediate release tablet Commonly known as:  ROXICODONE Take one tablet by mouth every 4 hours as needed for pain HOLD for sedation, resp depression   pantoprazole 40 MG tablet Commonly known as:  PROTONIX Take 40 mg by mouth daily.   potassium chloride SA 20  MEQ tablet Commonly known as:  K-DUR,KLOR-CON Take 20 mEq by mouth daily.   simvastatin 40 MG tablet Commonly known as:  ZOCOR Take 40 mg by mouth daily.   temazepam 30 MG capsule Commonly known as:  RESTORIL Take 30 mg by mouth at bedtime as needed for sleep.       Review of Systems   In general is not complaining fever or chills .  Skin is not complaining of itching however does Feel he's had a developing cellulitis  of his left lower leg.  Head ears eyes nose mouth and throat-does not complain of sore throat visual changes.  Respiratory has somewhat of a chronic cough does continue to smoke he is not complaining of increased shortness of breath from baseline.  Cardiac does not complaining of chest pain has some chronic lower extremity edema  GI is not complaining of abdominal discomfort nausea vomiting diarrhea constipation.  GU does not complain of dysuria.  Muscle skeletal has chronic osteoarthritic pain of the joints  shoulder-this appears relatively baseline.  Neurologic is not complaining of dizziness headache or numbness at this point.  Psych does not complain of depression or anxiety          Immunization History  Administered Date(s) Administered  . DTP 05/28/2001  . Influenza-Unspecified 05/26/2013, 05/04/2014, 04/29/2016  . Pneumococcal-Unspecified 07/28/2009, 05/06/2016  . Tdap 12/30/2012   Pertinent  Health Maintenance Due  Topic Date Due  . COLONOSCOPY  11/24/2006  . INFLUENZA VACCINE  02/25/2017   Fall Risk  12/30/2012  Falls in the past year? No   Functional Status Survey:      Temperature is 98.4 pulse 64 respirations 17 blood pressure 131/76 O2 saturation 96% on room air  Physical Exam In general this is a well-nourished middle-aged male in no distress.  His skin is warm and dry-l On lateral portion of the left lower leg below the knee there is a small area of possible increased erythema with scant drainage it is somewhat warm to touch and tender to palpation he has chronic venous stasis changes diffuse   Eyes pupils appear reactive light sclera and conjunctiva are clear visual acuity appears grossly intact  Oropharynx is clear mucous membranes moist.  Chest he has some diffuse rhonchi wheezing on expiration there is no labored breathing this is relatively baseline with  previous exams.  Heart is regular rate and rhythm without murmur gallop or rub he has some mild lower extremity edema which appears baseline. He has compression hose on  Abdomen is  obese soft nontender with positive bowel sounds.  Musculoskeletal is status post  A right above-the-knee amputation --strength appears preserved in all other extremities.--Continues to ambulate  in wheelchair-  Neurologic is grossly intact no lateralizing findings her speech is clear.  Psych he is alert and oriented pleasant and appropriate.  Labs  reviewed:  RecentLabs(withinlast365days)   Recent Labs  10/24/16 0212 11/04/16 0700 11/11/16 0730  NA 132* 138 133*  K 4.3 4.9 4.0  CL 96* 97* 96*  CO2 28 34* 28  GLUCOSE 127* 105* 79  BUN 31* 25* 29*  CREATININE 1.64* 1.62* 1.60*  CALCIUM 9.1 9.9 9.0      RecentLabs(withinlast365days)   Recent Labs  01/17/16 0145 01/30/16 0739 10/24/16 0212  AST 21 18 18   ALT 16* 12* 13*  ALKPHOS 62 52 51  BILITOT 0.8 0.5 0.7  PROT 7.5 6.1* 6.8  ALBUMIN 4.1 3.4* 4.0      RecentLabs(withinlast365days)   Recent Labs  07/22/16 0700 10/24/16 0212 11/11/16 0730  WBC 6.4 7.9 7.0  NEUTROABS 3.8  --  4.0  HGB 15.1 14.6 16.2  HCT 46.6 43.9 48.6  MCV 90.8 89.4 90.7  PLT 154 135* 170     RecentLabs       Lab Results  Component Value Date   TSH 4.446 10/24/2016     RecentLabs       Lab Results  Component Value Date   HGBA1C 5.6 10/24/2016     RecentLabs       Lab Results  Component Value Date   CHOL 135 10/24/2016   HDL 29 (L) 10/24/2016   LDLCALC 69 10/24/2016   TRIG 186 (H) 10/24/2016   CHOLHDL 4.7 10/24/2016     Assessment plan.  #1-question recurrent cellulitis left lower extremity- Will start doxycycline 100 mg twice a day for 7 days he will need follow-up by wound care as well as vascular surgery which nursing is arranging.  Also culture any drainage that can be extracted from the site  Also will start a probiotic twice a day for 10 days.  This was discussed with Dr. Lyndel Safe  #2 history renal insufficiency this appears relatively baseline with a recent creatinine 1.6 on lab done a month ago will update this   [phone]

## 2016-12-16 ENCOUNTER — Encounter (HOSPITAL_COMMUNITY)
Admission: RE | Admit: 2016-12-16 | Discharge: 2016-12-16 | Disposition: A | Discharge status: Home / Self Care | Payer: Medicare Other | Source: Skilled Nursing Facility | Attending: Internal Medicine | Admitting: Internal Medicine

## 2016-12-16 DIAGNOSIS — K259 Gastric ulcer, unspecified as acute or chronic, without hemorrhage or perforation: Secondary | ICD-10-CM | POA: Diagnosis not present

## 2016-12-16 DIAGNOSIS — I739 Peripheral vascular disease, unspecified: Secondary | ICD-10-CM | POA: Diagnosis not present

## 2016-12-16 DIAGNOSIS — G4700 Insomnia, unspecified: Secondary | ICD-10-CM | POA: Insufficient documentation

## 2016-12-16 DIAGNOSIS — Z89511 Acquired absence of right leg below knee: Secondary | ICD-10-CM | POA: Diagnosis not present

## 2016-12-16 DIAGNOSIS — J449 Chronic obstructive pulmonary disease, unspecified: Secondary | ICD-10-CM | POA: Insufficient documentation

## 2016-12-16 LAB — CBC WITH DIFFERENTIAL/PLATELET
BASOS ABS: 0.1 10*3/uL (ref 0.0–0.1)
BASOS PCT: 1 %
Eosinophils Absolute: 0.3 10*3/uL (ref 0.0–0.7)
Eosinophils Relative: 4 %
HEMATOCRIT: 47.6 % (ref 39.0–52.0)
Hemoglobin: 15.5 g/dL (ref 13.0–17.0)
Lymphocytes Relative: 26 %
Lymphs Abs: 2.1 10*3/uL (ref 0.7–4.0)
MCH: 30 pg (ref 26.0–34.0)
MCHC: 32.6 g/dL (ref 30.0–36.0)
MCV: 92.2 fL (ref 78.0–100.0)
Monocytes Absolute: 0.4 10*3/uL (ref 0.1–1.0)
Monocytes Relative: 6 %
NEUTROS ABS: 4.9 10*3/uL (ref 1.7–7.7)
NEUTROS PCT: 63 %
Platelets: 180 10*3/uL (ref 150–400)
RBC: 5.16 MIL/uL (ref 4.22–5.81)
RDW: 14.8 % (ref 11.5–15.5)
WBC: 7.8 10*3/uL (ref 4.0–10.5)

## 2016-12-16 LAB — BASIC METABOLIC PANEL
Anion gap: 12 (ref 5–15)
BUN: 29 mg/dL — ABNORMAL HIGH (ref 6–20)
CALCIUM: 9.6 mg/dL (ref 8.9–10.3)
CO2: 32 mmol/L (ref 22–32)
CREATININE: 1.52 mg/dL — AB (ref 0.61–1.24)
Chloride: 93 mmol/L — ABNORMAL LOW (ref 101–111)
GFR calc Af Amer: 56 mL/min — ABNORMAL LOW (ref >60)
GFR calc non Af Amer: 48 mL/min — ABNORMAL LOW (ref >60)
GLUCOSE: 91 mg/dL (ref 65–99)
Potassium: 3.8 mmol/L (ref 3.5–5.1)
Sodium: 137 mmol/L (ref 135–145)

## 2016-12-17 ENCOUNTER — Encounter: Payer: Self-pay | Admitting: Family

## 2016-12-17 ENCOUNTER — Other Ambulatory Visit: Payer: Self-pay | Admitting: *Deleted

## 2016-12-17 MED ORDER — OXYCODONE HCL 15 MG PO TABS
ORAL_TABLET | ORAL | 0 refills | Status: DC
Start: 2016-12-17 — End: 2017-01-22

## 2016-12-17 NOTE — Telephone Encounter (Signed)
[facility] Nursing #[phone] Fax: [phone]   

## 2016-12-18 ENCOUNTER — Ambulatory Visit: Payer: Medicare Other | Admitting: Family

## 2016-12-18 ENCOUNTER — Ambulatory Visit (INDEPENDENT_AMBULATORY_CARE_PROVIDER_SITE_OTHER): Payer: Medicare Other | Admitting: Vascular Surgery

## 2016-12-18 ENCOUNTER — Encounter: Payer: Self-pay | Admitting: Vascular Surgery

## 2016-12-18 VITALS — BP 115/70 | HR 59 | Temp 98.5°F | Resp 20 | Ht 68.0 in | Wt 214.0 lb

## 2016-12-18 DIAGNOSIS — L97229 Non-pressure chronic ulcer of left calf with unspecified severity: Secondary | ICD-10-CM

## 2016-12-18 DIAGNOSIS — I6523 Occlusion and stenosis of bilateral carotid arteries: Secondary | ICD-10-CM

## 2016-12-18 DIAGNOSIS — I83022 Varicose veins of left lower extremity with ulcer of calf: Secondary | ICD-10-CM

## 2016-12-18 DIAGNOSIS — L97221 Non-pressure chronic ulcer of left calf limited to breakdown of skin: Secondary | ICD-10-CM

## 2016-12-18 DIAGNOSIS — L97929 Non-pressure chronic ulcer of unspecified part of left lower leg with unspecified severity: Secondary | ICD-10-CM | POA: Diagnosis not present

## 2016-12-18 DIAGNOSIS — I83029 Varicose veins of left lower extremity with ulcer of unspecified site: Secondary | ICD-10-CM

## 2016-12-18 NOTE — Progress Notes (Signed)
Patient is a 61 year old male who returns for follow-up today. We have followed him for peripheral arterial disease in the past. He has previously had a left common femoral endarterectomy. He has also had episodes of venous stasis ulcers. He was last seen for this March 2018. He was treated with Unna boots. The ulcer did heal with Unna boot therapy. He presents today with recurrence of ulceration in the left leg. He states that he has been compliant with his compression stockings. However he did remove the stockings recently after the ulcer developed. He has previously had a right below-knee amputation. He also has a history of moderate carotid disease which we follow with serial ultrasound. This has remained asymptomatic. He is on a statin and Plavix.  Past Medical History:  Diagnosis Date  . Anemia, unspecified   . Arthritis    "shoulders" (09/22/2014)  . Asthma   . Atrial fibrillation ([place])   . Carotid artery occlusion   . CHF (congestive heart failure) ([place])   . COPD 11/27/2007   Qualifier: Diagnosis of  By: Garen Grams    . Coronary artery disease    Cardiac catheterization in 2008 showed 99% stenosis in proximal left circumflex which was treated with Taxus drug-eluting stent. There was mild RCA and LAD disease with normal ejection fraction.  Marland Kitchen ERECTILE DYSFUNCTION 11/27/2007   Qualifier: Diagnosis of  By: Garen Grams    . Gastric ulcer, unspecified as acute or chronic, without mention of hemorrhage, perforation, or obstruction   . Heart murmur   . HYPERLIPIDEMIA 11/27/2007   Qualifier: Diagnosis of  By: Garen Grams    . Hypertension   . HYPERTENSION, BENIGN 05/23/2009   Qualifier: Diagnosis of  By: Melvyn Novas MD, Christena Deem   . Other severe protein-calorie malnutrition   . PAD (peripheral artery disease) (H[facility])    Previous right SFA stent in 2003. Directional atherectomy right SFA in 01/2013  . Pneumonia 2000  . Pressure ulcer, lower back(707.03)   . Tobacco use   . Traumatic  amputation of leg(s) (complete) (partial), unilateral, at or above knee, without mention of complication      Current Outpatient Prescriptions on File Prior to Visit  Medication Sig Dispense Refill  . aspirin EC 81 MG tablet Take 1 tablet (81 mg total) by mouth daily. 150 tablet 2  . cloNIDine (CATAPRES) 0.2 MG tablet Take 0.2 mg by mouth 2 (two) times daily.    . clopidogrel (PLAVIX) 75 MG tablet Take 75 mg by mouth daily. @@ 9:00 pm    . colchicine 0.6 MG tablet Take 0.3 mg by mouth daily.     . Fluticasone-Salmeterol (ADVAIR) 250-50 MCG/DOSE AEPB Inhale 1 puff into the lungs 2 (two) times daily.    . furosemide (LASIX) 20 MG tablet Take 20 mg by mouth daily. Sunday, Tuesday, Thursday, Saturday    . furosemide (LASIX) 40 MG tablet Take 40 mg by mouth daily. Mon,Wed,Fri    . isosorbide mononitrate (IMDUR) 30 MG 24 hr tablet Take 1 tablet (30 mg total) by mouth daily. 30 tablet 5  . lisinopril (PRINIVIL,ZESTRIL) 40 MG tablet Take 40 mg by mouth daily. Reported on 11/22/2015    . metoprolol tartrate (LOPRESSOR) 25 MG tablet Take 1 tablet (25 mg total) by mouth 2 (two) times daily. 60 tablet 5  . oxyCODONE (ROXICODONE) 15 MG immediate release tablet Take one tablet by mouth every 4 hours as needed for pain HOLD for sedation, resp depression 180 tablet 0  . pantoprazole (PROTONIX) 40  MG tablet Take 40 mg by mouth daily.    . potassium chloride SA (K-DUR,KLOR-CON) 20 MEQ tablet Take 20 mEq by mouth daily.     . simvastatin (ZOCOR) 40 MG tablet Take 40 mg by mouth daily.    . temazepam (RESTORIL) 30 MG capsule Take 30 mg by mouth at bedtime as needed for sleep.     No current facility-administered medications on file prior to visit.    Review of systems: He denies prior history of DVT. He does have a prior history of venous stasis ulcers. He denies claudication symptoms but does not really ambulate much. He is in a wheelchair most of the time.  Physical exam:  Vitals:   12/18/16 0942  BP:  115/70  Pulse: (!) 59  Resp: 20  Temp: 98.5 F (36.9 C)  TempSrc: Oral  SpO2: 97%  Weight: 214 lb (97.1 kg)  Height: 5\' 8"  (1.727 m)    Extremities: Left lower extremity palpable left femoral pulse absent pedal pulses foot is warm pink well-perfused no ulcerations on the foot itself knee. Skin: There is a 3 x 2 cm ulceration left lateral calf which is less than 1 mm in depth. The entire leg is edematous with a chronic type appearance extending from the knee down to the foot.  Data: Patient had bilateral ABIs performed a few weeks ago I reviewed this today which showed an ABI on the left side of 0.7.  Assessment: Patient with recurrent venous stasis ulcer left leg. We will place him in 4 weeks worth Unna boot therapy again today. I do not feel he is a good candidate for consideration of laser ablation of the saphenous vein as he has peripheral arterial disease and may require that vein at some point in the future. Some of the swelling may have a component of lymphedema from his recent groin operation.  Plan: Unna boot therapy weekly 4 weeks. He will see our nurse practitioner or PA at the end of that 4 weeks to assess for wound healing. He will need to return to full-time compression stockings after the wound is completely healed.  Ruta Hinds, MD [facility] Office: [phone] Pager: [phone]

## 2016-12-23 ENCOUNTER — Other Ambulatory Visit: Payer: Self-pay | Admitting: *Deleted

## 2016-12-23 MED ORDER — TEMAZEPAM 30 MG PO CAPS: ORAL_CAPSULE | ORAL | 0 refills | Status: DC

## 2016-12-23 NOTE — Telephone Encounter (Signed)
[facility] Nursing #[phone] Fax: [phone]   

## 2016-12-25 ENCOUNTER — Ambulatory Visit (INDEPENDENT_AMBULATORY_CARE_PROVIDER_SITE_OTHER): Payer: Medicare Other | Admitting: Family

## 2016-12-25 DIAGNOSIS — I739 Peripheral vascular disease, unspecified: Secondary | ICD-10-CM

## 2016-12-25 NOTE — Progress Notes (Signed)
Removed unna boot, cleansed leg, no drainage noted. Reapplied new unna boot.

## 2016-12-29 ENCOUNTER — Encounter: Payer: Self-pay | Admitting: Family

## 2017-01-01 ENCOUNTER — Encounter: Payer: Self-pay | Admitting: Family

## 2017-01-01 ENCOUNTER — Ambulatory Visit (INDEPENDENT_AMBULATORY_CARE_PROVIDER_SITE_OTHER): Payer: Medicare Other | Admitting: Family

## 2017-01-01 ENCOUNTER — Encounter: Payer: Medicare Other | Admitting: Family

## 2017-01-01 VITALS — BP 110/69 | HR 61 | Resp 20 | Ht 68.0 in | Wt 221.0 lb

## 2017-01-01 DIAGNOSIS — L97929 Non-pressure chronic ulcer of unspecified part of left lower leg with unspecified severity: Secondary | ICD-10-CM | POA: Diagnosis not present

## 2017-01-01 DIAGNOSIS — I83029 Varicose veins of left lower extremity with ulcer of unspecified site: Secondary | ICD-10-CM

## 2017-01-01 NOTE — Progress Notes (Signed)
Unna boot change by Psychologist, counselling. Return in 1 week for repeat unna boot change.

## 2017-01-05 ENCOUNTER — Encounter: Payer: Self-pay | Admitting: Family

## 2017-01-08 ENCOUNTER — Ambulatory Visit (INDEPENDENT_AMBULATORY_CARE_PROVIDER_SITE_OTHER): Payer: Medicare Other | Admitting: Family

## 2017-01-08 DIAGNOSIS — I83029 Varicose veins of left lower extremity with ulcer of unspecified site: Secondary | ICD-10-CM | POA: Diagnosis not present

## 2017-01-08 DIAGNOSIS — L97929 Non-pressure chronic ulcer of unspecified part of left lower leg with unspecified severity: Secondary | ICD-10-CM | POA: Diagnosis not present

## 2017-01-08 NOTE — Progress Notes (Signed)
Patient came in for a weekly Unna Boot change.  Patient thinks his leg is looking much better each week.  There is no drainage and the skin is in tact.  Mr. Haack has a follow up appt with Clemon Chambers, NP next week.  He will contact the office if he has any  Concerns prior to his appointment.    Thurston Hole., LPN

## 2017-01-13 ENCOUNTER — Encounter: Payer: Self-pay | Admitting: Internal Medicine

## 2017-01-13 NOTE — Progress Notes (Signed)
Location:   [facility] Room Number: 107/W Place of Service:  SNF [phone]) Provider:  Kyung Rudd, Rene Kocher, MD  Patient Care Team: [name], MD as PCP - General (Internal Medicine)  Extended Emergency Contact Information Primary Emergency Contact: Harvel Quale, [place] [address] [place] Mobile Phone: [phone] Relation: Daughter  Code Status:  Full Code Goals of care: Advanced Directive information Advanced Directives 01/13/2017  Does Patient Have a Medical Advance Directive? Yes  Type of Advance Directive (No Data)  Does patient want to make changes to medical advance directive? No - Patient declined  Copy of [place] in Chart? -  Would patient like information on creating a medical advance directive? No - Patient declined  Pre-existing out of facility DNR order (yellow form or pink MOST form) -     Chief Complaint  Patient presents with  . Medical Management of Chronic Issues    Routine Visit    HPI:  Pt is a 61 y.o. male seen today for medical management of chronic diseases.     Past Medical History:  Diagnosis Date  . Anemia, unspecified   . Arthritis    "shoulders" (09/22/2014)  . Asthma   . Atrial fibrillation ([place])   . Carotid artery occlusion   . CHF (congestive heart failure) ([place])   . COPD 11/27/2007   Qualifier: Diagnosis of  By: Garen Grams    . Coronary artery disease    Cardiac catheterization in 2008 showed 99% stenosis in proximal left circumflex which was treated with Taxus drug-eluting stent. There was mild RCA and LAD disease with normal ejection fraction.  Marland Kitchen ERECTILE DYSFUNCTION 11/27/2007   Qualifier: Diagnosis of  By: Garen Grams    . Gastric ulcer, unspecified as acute or chronic, without mention of hemorrhage, perforation, or obstruction   . Heart murmur   . HYPERLIPIDEMIA 11/27/2007   Qualifier: Diagnosis of  By: Garen Grams    . Hypertension    . HYPERTENSION, BENIGN 05/23/2009   Qualifier: Diagnosis of  By: Melvyn Novas MD, Christena Deem   . Other severe protein-calorie malnutrition   . PAD (peripheral artery disease) (HCC)    Previous right SFA stent in 2003. Directional atherectomy right SFA in 01/2013  . Pneumonia 2000  . Pressure ulcer, lower back(707.03)   . Tobacco use   . Traumatic amputation of leg(s) (complete) (partial), unilateral, at or above knee, without mention of complication    Past Surgical History:  Procedure Laterality Date  . ABDOMINAL AORTAGRAM N/A 02/23/2013   Procedure: ABDOMINAL Maxcine Ham;  Surgeon: Wellington Hampshire, MD;  Location: [facility] CATH LAB;  Service: Cardiovascular;  Laterality: N/A;  . ABDOMINAL AORTAGRAM N/A 09/22/2014   Procedure: ABDOMINAL Maxcine Ham;  Surgeon: Elam Dutch, MD;  Location: [facility] CATH LAB;  Service: Cardiovascular;  Laterality: N/A;  . ACNE CYST REMOVAL    . AMPUTATION Right 09/09/2013   Procedure: AMPUTATION ABOVE KNEE;  Surgeon: Rosetta Posner, MD;  Location: [facility];  Service: Vascular;  Laterality: Right;  . ATHERECTOMY N/A 03/02/2013   Procedure: ATHERECTOMY;  Surgeon: Wellington Hampshire, MD;  Location: [facility] LAB;  Service: Cardiovascular;  Laterality: N/A;  . CARDIAC CATHETERIZATION N/A 09/03/2015   Procedure: Left Heart Cath and Coronary Angiography;  Surgeon: Leonie Man, MD;  Location: [facility] CV LAB;  Service: Cardiovascular;  Laterality: N/A;  . CARDIAC CATHETERIZATION N/A 09/03/2015   Procedure: Coronary  Balloon Angioplasty;  Surgeon: Leonie Man, MD;  Location: [facility] CV LAB;  Service: Cardiovascular;  Laterality: N/A;  . CORONARY ANGIOPLASTY  09/03/2015  . ENDARTERECTOMY Left 08/16/2013   Procedure: Exploration of Left Neck/Fix Bleeding;  Surgeon: Elam Dutch, MD;  Location: [facility];  Service: Vascular;  Laterality: Left;  . ENDARTERECTOMY FEMORAL Left 01/30/2016   Procedure: ENDARTERECTOMY LEFT FEMORAL ARTERY;  Surgeon: Elam Dutch, MD;  Location: [facility] OR;  Service:  Vascular;  Laterality: Left;  . ESOPHAGOGASTRODUODENOSCOPY N/A 09/08/2013   Procedure: ESOPHAGOGASTRODUODENOSCOPY (EGD);  Surgeon: Cleotis Nipper, MD;  Location: [facility] ENDOSCOPY;  Service: Endoscopy;  Laterality: N/A;  . ESOPHAGOGASTRODUODENOSCOPY (EGD) WITH PROPOFOL N/A 01/05/2014   Procedure: ESOPHAGOGASTRODUODENOSCOPY (EGD) WITH PROPOFOL;  Surgeon: Cleotis Nipper, MD;  Location: [facility] ENDOSCOPY;  Service: Endoscopy;  Laterality: N/A;  . FEMORAL ARTERY STENT Right 2003   [name] 09/22/2014  . FLEXIBLE SIGMOIDOSCOPY N/A 09/08/2013   Procedure: FLEXIBLE SIGMOIDOSCOPY;  Surgeon: Cleotis Nipper, MD;  Location: [facility] ENDOSCOPY;  Service: Endoscopy;  Laterality: N/A;  unprepp  . ILIAC ARTERY STENT Left 09/22/2014  . NASAL SINUS SURGERY  2004  . PAROTIDECTOMY Right 03/29/2014   Procedure: PAROTIDECTOMY;  Surgeon: Ascencion Dike, MD;  Location: [facility];  Service: ENT;  Laterality: Right;  . PATCH ANGIOPLASTY Left 01/30/2016   Procedure: LEFT FEMORAL ARTYERY PATCH ANGIOPLASTY USING HEMASHIELD PLATINUM FINESSE PATCH;  Surgeon: Elam Dutch, MD;  Location: [facility];  Service: Vascular;  Laterality: Left;  . PERIPHERAL VASCULAR CATHETERIZATION N/A 08/17/2015   Procedure: Abdominal Aortogram;  Surgeon: Elam Dutch, MD;  Location: [facility] CV LAB;  Service: Cardiovascular;  Laterality: N/A;    Allergies  Allergen Reactions  . Codeine Nausea Only    Outpatient Encounter Prescriptions as of 01/13/2017  Medication Sig  . aspirin EC 81 MG tablet Take 1 tablet (81 mg total) by mouth daily.  . cloNIDine (CATAPRES) 0.2 MG tablet Take 0.2 mg by mouth 2 (two) times daily.  . clopidogrel (PLAVIX) 75 MG tablet Take 75 mg by mouth daily. @@ 9:00 pm  . colchicine 0.6 MG tablet Take 0.3 mg by mouth daily.   . Fluticasone-Salmeterol (ADVAIR) 250-50 MCG/DOSE AEPB Inhale 1 puff into the lungs 2 (two) times daily.  . furosemide (LASIX) 20 MG tablet Take 20 mg by mouth daily. Sunday, Tuesday, Thursday, Saturday  . furosemide  (LASIX) 40 MG tablet Take 40 mg by mouth daily. Mon,Wed,Fri  . isosorbide mononitrate (IMDUR) 30 MG 24 hr tablet Take 1 tablet (30 mg total) by mouth daily.  Marland Kitchen lisinopril (PRINIVIL,ZESTRIL) 40 MG tablet Take 40 mg by mouth daily. Reported on 11/22/2015  . metoprolol tartrate (LOPRESSOR) 25 MG tablet Take 12.5 mg by mouth 2 (two) times daily.  Marland Kitchen oxyCODONE (ROXICODONE) 15 MG immediate release tablet Take one tablet by mouth every 4 hours as needed for pain HOLD for sedation, resp depression  . pantoprazole (PROTONIX) 40 MG tablet Take 40 mg by mouth daily.  . potassium chloride SA (K-DUR,KLOR-CON) 20 MEQ tablet Take 20 mEq by mouth daily.   . simvastatin (ZOCOR) 40 MG tablet Take 40 mg by mouth daily.  . temazepam (RESTORIL) 30 MG capsule Take one capsule by mouth at bedtime for rest. Hold for sedation or respiratory depression  . [DISCONTINUED] doxycycline (DORYX) 100 MG EC tablet Take 100 mg by mouth 2 (two) times daily.  . [DISCONTINUED] Lactobacillus Rhamnosus, GG, ([employer] PROBIOTIC, LACTOBACILLUS, PO) Take by mouth.  . [DISCONTINUED] metoprolol tartrate (LOPRESSOR) 25  MG tablet Take 1 tablet (25 mg total) by mouth 2 (two) times daily. (Patient taking differently: Take 12.5 mg by mouth 2 (two) times daily. )   No facility-administered encounter medications on file as of 01/13/2017.     Review of Systems  Immunization History  Administered Date(s) Administered  . DTP 05/28/2001  . Influenza-Unspecified 05/26/2013, 05/04/2014, 04/29/2016  . Pneumococcal-Unspecified 07/28/2009, 05/06/2016  . Tdap 12/30/2012   Pertinent  Health Maintenance Due  Topic Date Due  . COLONOSCOPY  11/24/2006  . INFLUENZA VACCINE  02/25/2017   Fall Risk  12/30/2012  Falls in the past year? No   Functional Status Survey:    Vitals:   01/04/17 0948  BP: 129/67  Pulse: 63  Resp: (!) 24  Temp: 98.7 F (37.1 C)  TempSrc: Oral  Weight: 222 lb 3.2 oz (100.8 kg)  Height: 5\' 8"  (1.727 m)   Body mass index is  33.79 kg/m. Physical Exam  Labs reviewed:  Recent Labs  11/04/16 0700 11/11/16 0730 12/16/16 0731  NA 138 133* 137  K 4.9 4.0 3.8  CL 97* 96* 93*  CO2 34* 28 32  GLUCOSE 105* 79 91  BUN 25* 29* 29*  CREATININE 1.62* 1.60* 1.52*  CALCIUM 9.9 9.0 9.6    Recent Labs  01/17/16 0145 01/30/16 0739 10/24/16 0212  AST 21 18 18   ALT 16* 12* 13*  ALKPHOS 62 52 51  BILITOT 0.8 0.5 0.7  PROT 7.5 6.1* 6.8  ALBUMIN 4.1 3.4* 4.0    Recent Labs  07/22/16 0700 10/24/16 0212 11/11/16 0730 12/16/16 0731  WBC 6.4 7.9 7.0 7.8  NEUTROABS 3.8  --  4.0 4.9  HGB 15.1 14.6 16.2 15.5  HCT 46.6 43.9 48.6 47.6  MCV 90.8 89.4 90.7 92.2  PLT 154 135* 170 180   Lab Results  Component Value Date   TSH 4.446 10/24/2016   Lab Results  Component Value Date   HGBA1C 5.6 10/24/2016   Lab Results  Component Value Date   CHOL 135 10/24/2016   HDL 29 (L) 10/24/2016   LDLCALC 69 10/24/2016   TRIG 186 (H) 10/24/2016   CHOLHDL 4.7 10/24/2016    Significant Diagnostic Results in last 30 days:  No results found.  Assessment/Plan There are no diagnoses linked to this encounter.   Family/ staff Communication:   Labs/tests ordered:

## 2017-01-14 ENCOUNTER — Ambulatory Visit (INDEPENDENT_AMBULATORY_CARE_PROVIDER_SITE_OTHER): Payer: Medicare Other | Admitting: Family

## 2017-01-14 ENCOUNTER — Encounter: Payer: Self-pay | Admitting: Family

## 2017-01-14 VITALS — BP 148/77 | HR 74 | Temp 97.5°F | Resp 20 | Ht 68.0 in | Wt 222.0 lb

## 2017-01-14 DIAGNOSIS — Z95828 Presence of other vascular implants and grafts: Secondary | ICD-10-CM | POA: Diagnosis not present

## 2017-01-14 DIAGNOSIS — I739 Peripheral vascular disease, unspecified: Secondary | ICD-10-CM

## 2017-01-14 DIAGNOSIS — L97929 Non-pressure chronic ulcer of unspecified part of left lower leg with unspecified severity: Secondary | ICD-10-CM

## 2017-01-14 DIAGNOSIS — I83029 Varicose veins of left lower extremity with ulcer of unspecified site: Secondary | ICD-10-CM | POA: Diagnosis not present

## 2017-01-14 DIAGNOSIS — Z89611 Acquired absence of right leg above knee: Secondary | ICD-10-CM

## 2017-01-14 DIAGNOSIS — I6523 Occlusion and stenosis of bilateral carotid arteries: Secondary | ICD-10-CM

## 2017-01-14 DIAGNOSIS — F17200 Nicotine dependence, unspecified, uncomplicated: Secondary | ICD-10-CM | POA: Diagnosis not present
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
## 2017-01-14 NOTE — Patient Instructions (Addendum)
Steps to Quit Smoking Smoking tobacco can be bad for your health. It can also affect almost every organ in your body. Smoking puts you and people around you at risk for many serious long-lasting (chronic) diseases. Quitting smoking is hard, but it is one of the best things that you can do for your health. It is never too late to quit. What are the benefits of quitting smoking? When you quit smoking, you lower your risk for getting serious diseases and conditions. They can include:  Lung cancer or lung disease.  Heart disease.  Stroke.  Heart attack.  Not being able to have children (infertility).  Weak bones (osteoporosis) and broken bones (fractures).  If you have coughing, wheezing, and shortness of breath, those symptoms may get better when you quit. You may also get sick less often. If you are pregnant, quitting smoking can help to lower your chances of having a baby of low birth weight. What can I do to help me quit smoking? Talk with your doctor about what can help you quit smoking. Some things you can do (strategies) include:  Quitting smoking totally, instead of slowly cutting back how much you smoke over a period of time.  Going to in-person counseling. You are more likely to quit if you go to many counseling sessions.  Using resources and support systems, such as: ? Online chats with a counselor. ? Phone quitlines. ? Printed self-help materials. ? Support groups or group counseling. ? Text messaging programs. ? Mobile phone apps or applications.  Taking medicines. Some of these medicines may have nicotine in them. If you are pregnant or breastfeeding, do not take any medicines to quit smoking unless your doctor says it is okay. Talk with your doctor about counseling or other things that can help you.  Talk with your doctor about using more than one strategy at the same time, such as taking medicines while you are also going to in-person counseling. This can help make  quitting easier. What things can I do to make it easier to quit? Quitting smoking might feel very hard at first, but there is a lot that you can do to make it easier. Take these steps:  Talk to your family and friends. Ask them to support and encourage you.  Call phone quitlines, reach out to support groups, or work with a counselor.  Ask people who smoke to not smoke around you.  Avoid places that make you want (trigger) to smoke, such as: ? Bars. ? Parties. ? Smoke-break areas at work.  Spend time with people who do not smoke.  Lower the stress in your life. Stress can make you want to smoke. Try these things to help your stress: ? Getting regular exercise. ? Deep-breathing exercises. ? Yoga. ? Meditating. ? Doing a body scan. To do this, close your eyes, focus on one area of your body at a time from head to toe, and notice which parts of your body are tense. Try to relax the muscles in those areas.  Download or buy apps on your mobile phone or tablet that can help you stick to your quit plan. There are many free apps, such as QuitGuide from the CDC (Centers for Disease Control and Prevention). You can find more support from smokefree.gov and other websites.  This information is not intended to replace advice given to you by your health care provider. Make sure you discuss any questions you have with your health care provider. Document Released: 05/10/2009 Document   Revised: 03/11/2016 Document Reviewed: 11/28/2014 Elsevier Interactive Patient Education  2018 [employer].      Venous Ulcer A venous ulcer is a shallow sore on your lower leg. It is caused by poor circulation in your veins. Venous ulcer is the most common type of lower leg ulcer. You may have venous ulcers on one leg or on both legs. This condition most often develops around your ankles. This type of ulcer may last for a long time (chronic ulcer) or it may return often (recurrent ulcer). Follow these instructions  at home: Wound care  Follow instructions from your doctor about: ? How to take care of your wound. ? When and how you should change your bandage (dressing). ? When you should remove your bandage. If your bandage is dry and gets stuck to your leg when you try to remove it, moisten or wet the bandage with saline solution or water. This helps you to remove it without harming your skin or wound.  Check your wound every day for signs of infection. Have a caregiver do this for you if you are not able to do it yourself. Watch for: ? More redness, swelling, or pain. ? More fluid or blood. ? Pus, warmth, or a bad smell. Medicines  Take over-the-counter and prescription medicines only as told by your doctor.  If you were prescribed an antibiotic medicine, take it or apply it as told by your doctor. Do not stop taking or using the antibiotic even if your condition improves. Activity  Do not stand or sit in one position for a long period of time. Rest with your legs raised during the day. If possible, keep your legs above your heart for 30 minutes, 3-4 times a day, or as told by your doctor.  Do not sit with your legs crossed.  Walk often to increase the blood flow in your legs. Ask your doctor what level of activity is safe for you.  If you are taking a long ride in a car or plane, take a break to walk around at least once every two hours, or as told by your doctor. Ask your doctor if you should take aspirin before long trips. General instructions   Wear elastic stockings, compression stockings, or support hose as told by your doctor. This is very important.  Raise the foot of your bed as told by your doctor.  Do not smoke.  Keep all follow-up visits as told by your doctor. This is important. Contact a doctor if:  You have a fever.  Your ulcer is getting larger or is not healing.  Your pain gets worse.  You have more redness or swelling around your ulcer.  You have more fluid,  blood, or pus coming from your ulcer after it has been cleaned by you or your doctor.  You have warmth or a bad smell coming from your ulcer. This information is not intended to replace advice given to you by your health care provider. Make sure you discuss any questions you have with your health care provider. Document Released: 08/21/2004 Document Revised: 12/20/2015 Document Reviewed: 11/22/2014 Elsevier Interactive Patient Education  2018 [place].      Peripheral Vascular Disease Peripheral vascular disease (PVD) is a disease of the blood vessels that are not part of your heart and brain. A simple term for PVD is poor circulation. In most cases, PVD narrows the blood vessels that carry blood from your heart to the rest of your body. This can result  in a decreased supply of blood to your arms, legs, and internal organs, like your stomach or kidneys. However, it most often affects a person's lower legs and feet. There are two types of PVD.  Organic PVD. This is the more common type. It is caused by damage to the structure of blood vessels.  Functional PVD. This is caused by conditions that make blood vessels contract and tighten (spasm).  Without treatment, PVD tends to get worse over time. PVD can also lead to acute ischemic limb. This is when an arm or limb suddenly has trouble getting enough blood. This is a medical emergency. Follow these instructions at home:  Take medicines only as told by your doctor.  Do not use any tobacco products, including cigarettes, chewing tobacco, or electronic cigarettes. If you need help quitting, ask your doctor.  Lose weight if you are overweight, and maintain a healthy weight as told by your doctor.  Eat a diet that is low in fat and cholesterol. If you need help, ask your doctor.  Exercise regularly. Ask your doctor for some good activities for you.  Take good care of your feet. ? Wear comfortable shoes that fit well. ? Check your feet  often for any cuts or sores. Contact a doctor if:  You have cramps in your legs while walking.  You have leg pain when you are at rest.  You have coldness in a leg or foot.  Your skin changes.  You are unable to get or have an erection (erectile dysfunction).  You have cuts or sores on your feet that are not healing. Get help right away if:  Your arm or leg turns cold and blue.  Your arms or legs become red, warm, swollen, painful, or numb.  You have chest pain or trouble breathing.  You suddenly have weakness in your face, arm, or leg.  You become very confused or you cannot speak.  You suddenly have a very bad headache.  You suddenly cannot see. This information is not intended to replace advice given to you by your health care provider. Make sure you discuss any questions you have with your health care provider. Document Released: 10/08/2009 Document Revised: 12/20/2015 Document Reviewed: 12/22/2013 Elsevier Interactive Patient Education  2017 [employer].    Stroke Prevention Some medical conditions and behaviors are associated with an increased chance of having a stroke. You may prevent a stroke by making healthy choices and managing medical conditions. How can I reduce my risk of having a stroke? Stay physically active. Get at least 30 minutes of activity on most or all days. Do not smoke. It may also be helpful to avoid exposure to secondhand smoke. Limit alcohol use. Moderate alcohol use is considered to be: No more than 2 drinks per day for men. No more than 1 drink per day for nonpregnant women. Eat healthy foods. This involves: Eating 5 or more servings of fruits and vegetables a day. Making dietary changes that address high blood pressure (hypertension), high cholesterol, diabetes, or obesity. Manage your cholesterol levels. Making food choices that are high in fiber and low in saturated fat, trans fat, and cholesterol may control cholesterol levels. Take  any prescribed medicines to control cholesterol as directed by your health care provider. Manage your diabetes. Controlling your carbohydrate and sugar intake is recommended to manage diabetes. Take any prescribed medicines to control diabetes as directed by your health care provider. Control your hypertension. Making food choices that are low in salt (sodium),  saturated fat, trans fat, and cholesterol is recommended to manage hypertension. Ask your health care provider if you need treatment to lower your blood pressure. Take any prescribed medicines to control hypertension as directed by your health care provider. If you are 47-37 years of age, have your blood pressure checked every 3-5 years. If you are 53 years of age or older, have your blood pressure checked every year. Maintain a healthy weight. Reducing calorie intake and making food choices that are low in sodium, saturated fat, trans fat, and cholesterol are recommended to manage weight. Stop drug abuse. Avoid taking birth control pills. Talk to your health care provider about the risks of taking birth control pills if you are over 26 years old, smoke, get migraines, or have ever had a blood clot. Get evaluated for sleep disorders (sleep apnea). Talk to your health care provider about getting a sleep evaluation if you snore a lot or have excessive sleepiness. Take medicines only as directed by your health care provider. For some people, aspirin or blood thinners (anticoagulants) are helpful in reducing the risk of forming abnormal blood clots that can lead to stroke. If you have the irregular heart rhythm of atrial fibrillation, you should be on a blood thinner unless there is a good reason you cannot take them. Understand all your medicine instructions. Make sure that other conditions (such as anemia or atherosclerosis) are addressed. Get help right away if: You have sudden weakness or numbness of the face, arm, or leg, especially on  one side of the body. Your face or eyelid droops to one side. You have sudden confusion. You have trouble speaking (aphasia) or understanding. You have sudden trouble seeing in one or both eyes. You have sudden trouble walking. You have dizziness. You have a loss of balance or coordination. You have a sudden, severe headache with no known cause. You have new chest pain or an irregular heartbeat. Any of these symptoms may represent a serious problem that is an emergency. Do not wait to see if the symptoms will go away. Get medical help at once. Call your local emergency services (911 in [place] Do not drive yourself to the hospital. This information is not intended to replace advice given to you by your health care provider. Make sure you discuss any questions you have with your health care provider. Document Released: 08/21/2004 Document Revised: 12/20/2015 Document Reviewed: 01/14/2013 Elsevier Interactive Patient Education  2017 [place].      Peripheral Vascular Disease Peripheral vascular disease (PVD) is a disease of the blood vessels that are not part of your heart and brain. A simple term for PVD is poor circulation. In most cases, PVD narrows the blood vessels that carry blood from your heart to the rest of your body. This can result in a decreased supply of blood to your arms, legs, and internal organs, like your stomach or kidneys. However, it most often affects a person's lower legs and feet. There are two types of PVD.  Organic PVD. This is the more common type. It is caused by damage to the structure of blood vessels.  Functional PVD. This is caused by conditions that make blood vessels contract and tighten (spasm).  Without treatment, PVD tends to get worse over time. PVD can also lead to acute ischemic limb. This is when an arm or limb suddenly has trouble getting enough blood. This is a medical emergency. Follow these instructions at home:  Take medicines only as  told by your  doctor.  Do not use any tobacco products, including cigarettes, chewing tobacco, or electronic cigarettes. If you need help quitting, ask your doctor.  Lose weight if you are overweight, and maintain a healthy weight as told by your doctor.  Eat a diet that is low in fat and cholesterol. If you need help, ask your doctor.  Exercise regularly. Ask your doctor for some good activities for you.  Take good care of your feet. ? Wear comfortable shoes that fit well. ? Check your feet often for any cuts or sores. Contact a doctor if:  You have cramps in your legs while walking.  You have leg pain when you are at rest.  You have coldness in a leg or foot.  Your skin changes.  You are unable to get or have an erection (erectile dysfunction).  You have cuts or sores on your feet that are not healing. Get help right away if:  Your arm or leg turns cold and blue.  Your arms or legs become red, warm, swollen, painful, or numb.  You have chest pain or trouble breathing.  You suddenly have weakness in your face, arm, or leg.  You become very confused or you cannot speak.  You suddenly have a very bad headache.  You suddenly cannot see. This information is not intended to replace advice given to you by your health care provider. Make sure you discuss any questions you have with your health care provider. Document Released: 10/08/2009 Document Revised: 12/20/2015 Document Reviewed: 12/22/2013 Elsevier Interactive Patient Education  2017 [employer].

## 2017-01-15 ENCOUNTER — Encounter: Payer: Self-pay | Admitting: Family

## 2017-01-15 ENCOUNTER — Ambulatory Visit: Payer: Medicare Other | Admitting: Family

## 2017-01-22 ENCOUNTER — Other Ambulatory Visit: Payer: Self-pay | Admitting: *Deleted

## 2017-01-22 ENCOUNTER — Ambulatory Visit (INDEPENDENT_AMBULATORY_CARE_PROVIDER_SITE_OTHER): Payer: Medicare Other | Admitting: Family

## 2017-01-22 DIAGNOSIS — L97929 Non-pressure chronic ulcer of unspecified part of left lower leg with unspecified severity: Secondary | ICD-10-CM | POA: Diagnosis not present

## 2017-01-22 DIAGNOSIS — I83029 Varicose veins of left lower extremity with ulcer of unspecified site: Secondary | ICD-10-CM

## 2017-01-22 MED ORDER — OXYCODONE HCL 15 MG PO TABS
ORAL_TABLET | ORAL | 0 refills | Status: DC
Start: 2017-01-22 — End: 2017-03-11

## 2017-01-22 MED ORDER — TEMAZEPAM 30 MG PO CAPS: ORAL_CAPSULE | ORAL | 0 refills | Status: DC

## 2017-01-22 NOTE — Progress Notes (Signed)
Mr. Broughton came in for a weekly Unna Boot change.   He does not report having pain at this time.  Pt says his L leg "is looking better week after week".  His skin was intact.  I applied a new Unna Boot to his L leg, which he tolerated well. He will contact the office if he has any concerns.    Ashleigh E., LPN.

## 2017-01-22 NOTE — Telephone Encounter (Signed)
[facility] Nursing #[phone] Fax: [phone]   

## 2017-01-29 ENCOUNTER — Ambulatory Visit (INDEPENDENT_AMBULATORY_CARE_PROVIDER_SITE_OTHER): Payer: Medicare Other | Admitting: Family

## 2017-01-29 ENCOUNTER — Encounter: Payer: Self-pay | Admitting: Family

## 2017-01-29 VITALS — BP 105/61 | HR 63 | Temp 98.2°F | Resp 18 | Ht 68.0 in | Wt 220.0 lb

## 2017-01-29 DIAGNOSIS — I6523 Occlusion and stenosis of bilateral carotid arteries: Secondary | ICD-10-CM | POA: Diagnosis not present

## 2017-01-29 DIAGNOSIS — Z89611 Acquired absence of right leg above knee: Secondary | ICD-10-CM

## 2017-01-29 DIAGNOSIS — L97929 Non-pressure chronic ulcer of unspecified part of left lower leg with unspecified severity: Secondary | ICD-10-CM | POA: Diagnosis not present

## 2017-01-29 DIAGNOSIS — Z9862 Peripheral vascular angioplasty status: Secondary | ICD-10-CM

## 2017-01-29 DIAGNOSIS — R21 Rash and other nonspecific skin eruption: Secondary | ICD-10-CM

## 2017-01-29 DIAGNOSIS — I83029 Varicose veins of left lower extremity with ulcer of unspecified site: Secondary | ICD-10-CM

## 2017-01-29 DIAGNOSIS — Z95828 Presence of other vascular implants and grafts: Secondary | ICD-10-CM | POA: Diagnosis not present

## 2017-01-29 DIAGNOSIS — I739 Peripheral vascular disease, unspecified: Secondary | ICD-10-CM | POA: Diagnosis not present

## 2017-01-29 DIAGNOSIS — F17200 Nicotine dependence, unspecified, uncomplicated: Secondary | ICD-10-CM
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
## 2017-01-29 NOTE — Patient Instructions (Addendum)
Steps to Quit Smoking Smoking tobacco can be bad for your health. It can also affect almost every organ in your body. Smoking puts you and people around you at risk for many serious long-lasting (chronic) diseases. Quitting smoking is hard, but it is one of the best things that you can do for your health. It is never too late to quit. What are the benefits of quitting smoking? When you quit smoking, you lower your risk for getting serious diseases and conditions. They can include:  Lung cancer or lung disease.  Heart disease.  Stroke.  Heart attack.  Not being able to have children (infertility).  Weak bones (osteoporosis) and broken bones (fractures).  If you have coughing, wheezing, and shortness of breath, those symptoms may get better when you quit. You may also get sick less often. If you are pregnant, quitting smoking can help to lower your chances of having a baby of low birth weight. What can I do to help me quit smoking? Talk with your doctor about what can help you quit smoking. Some things you can do (strategies) include:  Quitting smoking totally, instead of slowly cutting back how much you smoke over a period of time.  Going to in-person counseling. You are more likely to quit if you go to many counseling sessions.  Using resources and support systems, such as: ? Online chats with a counselor. ? Phone quitlines. ? Printed self-help materials. ? Support groups or group counseling. ? Text messaging programs. ? Mobile phone apps or applications.  Taking medicines. Some of these medicines may have nicotine in them. If you are pregnant or breastfeeding, do not take any medicines to quit smoking unless your doctor says it is okay. Talk with your doctor about counseling or other things that can help you.  Talk with your doctor about using more than one strategy at the same time, such as taking medicines while you are also going to in-person counseling. This can help make  quitting easier. What things can I do to make it easier to quit? Quitting smoking might feel very hard at first, but there is a lot that you can do to make it easier. Take these steps:  Talk to your family and friends. Ask them to support and encourage you.  Call phone quitlines, reach out to support groups, or work with a counselor.  Ask people who smoke to not smoke around you.  Avoid places that make you want (trigger) to smoke, such as: ? Bars. ? Parties. ? Smoke-break areas at work.  Spend time with people who do not smoke.  Lower the stress in your life. Stress can make you want to smoke. Try these things to help your stress: ? Getting regular exercise. ? Deep-breathing exercises. ? Yoga. ? Meditating. ? Doing a body scan. To do this, close your eyes, focus on one area of your body at a time from head to toe, and notice which parts of your body are tense. Try to relax the muscles in those areas.  Download or buy apps on your mobile phone or tablet that can help you stick to your quit plan. There are many free apps, such as QuitGuide from the CDC (Centers for Disease Control and Prevention). You can find more support from smokefree.gov and other websites.  This information is not intended to replace advice given to you by your health care provider. Make sure you discuss any questions you have with your health care provider. Document Released: 05/10/2009 Document   Revised: 03/11/2016 Document Reviewed: 11/28/2014 Elsevier Interactive Patient Education  2018 [employer].      Peripheral Vascular Disease Peripheral vascular disease (PVD) is a disease of the blood vessels that are not part of your heart and brain. A simple term for PVD is poor circulation. In most cases, PVD narrows the blood vessels that carry blood from your heart to the rest of your body. This can result in a decreased supply of blood to your arms, legs, and internal organs, like your stomach or kidneys.  However, it most often affects a person's lower legs and feet. There are two types of PVD.  Organic PVD. This is the more common type. It is caused by damage to the structure of blood vessels.  Functional PVD. This is caused by conditions that make blood vessels contract and tighten (spasm).  Without treatment, PVD tends to get worse over time. PVD can also lead to acute ischemic limb. This is when an arm or limb suddenly has trouble getting enough blood. This is a medical emergency. Follow these instructions at home:  Take medicines only as told by your doctor.  Do not use any tobacco products, including cigarettes, chewing tobacco, or electronic cigarettes. If you need help quitting, ask your doctor.  Lose weight if you are overweight, and maintain a healthy weight as told by your doctor.  Eat a diet that is low in fat and cholesterol. If you need help, ask your doctor.  Exercise regularly. Ask your doctor for some good activities for you.  Take good care of your feet. ? Wear comfortable shoes that fit well. ? Check your feet often for any cuts or sores. Contact a doctor if:  You have cramps in your legs while walking.  You have leg pain when you are at rest.  You have coldness in a leg or foot.  Your skin changes.  You are unable to get or have an erection (erectile dysfunction).  You have cuts or sores on your feet that are not healing. Get help right away if:  Your arm or leg turns cold and blue.  Your arms or legs become red, warm, swollen, painful, or numb.  You have chest pain or trouble breathing.  You suddenly have weakness in your face, arm, or leg.  You become very confused or you cannot speak.  You suddenly have a very bad headache.  You suddenly cannot see. This information is not intended to replace advice given to you by your health care provider. Make sure you discuss any questions you have with your health care provider. Document Released:  10/08/2009 Document Revised: 12/20/2015 Document Reviewed: 12/22/2013 Elsevier Interactive Patient Education  2017 [employer] Inc.     Venous Ulcer A venous ulcer is a shallow sore on your lower leg. It is caused by poor circulation in your veins. Venous ulcer is the most common type of lower leg ulcer. You may have venous ulcers on one leg or on both legs. This condition most often develops around your ankles. This type of ulcer may last for a long time (chronic ulcer) or it may return often (recurrent ulcer). Follow these instructions at home: Wound care  Follow instructions from your doctor about: ? How to take care of your wound. ? When and how you should change your bandage (dressing). ? When you should remove your bandage. If your bandage is dry and gets stuck to your leg when you try to remove it, moisten or wet the bandage with  saline solution or water. This helps you to remove it without harming your skin or wound.  Check your wound every day for signs of infection. Have a caregiver do this for you if you are not able to do it yourself. Watch for: ? More redness, swelling, or pain. ? More fluid or blood. ? Pus, warmth, or a bad smell. Medicines  Take over-the-counter and prescription medicines only as told by your doctor.  If you were prescribed an antibiotic medicine, take it or apply it as told by your doctor. Do not stop taking or using the antibiotic even if your condition improves. Activity  Do not stand or sit in one position for a long period of time. Rest with your legs raised during the day. If possible, keep your legs above your heart for 30 minutes, 3-4 times a day, or as told by your doctor.  Do not sit with your legs crossed.  Walk often to increase the blood flow in your legs. Ask your doctor what level of activity is safe for you.  If you are taking a long ride in a car or plane, take a break to walk around at least once every two hours, or as told by your  doctor. Ask your doctor if you should take aspirin before long trips. General instructions   Wear elastic stockings, compression stockings, or support hose as told by your doctor. This is very important.  Raise the foot of your bed as told by your doctor.  Do not smoke.  Keep all follow-up visits as told by your doctor. This is important. Contact a doctor if:  You have a fever.  Your ulcer is getting larger or is not healing.  Your pain gets worse.  You have more redness or swelling around your ulcer.  You have more fluid, blood, or pus coming from your ulcer after it has been cleaned by you or your doctor.  You have warmth or a bad smell coming from your ulcer. This information is not intended to replace advice given to you by your health care provider. Make sure you discuss any questions you have with your health care provider. Document Released: 08/21/2004 Document Revised: 12/20/2015 Document Reviewed: 11/22/2014 Elsevier Interactive Patient Education  2018 [employer].   To measure for knee high compression hose: Measure the length of calf, largest circumference of calf, and ankle circumference first thing in the morning before your legs have a chance to swell.  Take these 3 measurements with you to obtain 20-30 mm mercury graduated knee high compression hose.  Put the stockings on in the morning, remove at bedtime.

## 2017-01-30 ENCOUNTER — Telehealth: Payer: Self-pay | Admitting: Family

## 2017-01-30 NOTE — Telephone Encounter (Signed)
Per instructions from Clemon Chambers, NP on 01/29/17 I scheduled an appointment for the patient to see Dr.Whitworth @ [facility] on 03/25/17 at 10:20am. The patient needs to arrive at 10:05 am and also bring a copy of his insurance cards. I notified Shontelle,RN at [facility] of this appt time/date. She stated she would relay the message to the transportation team at their facility. [facility]  [address] [phone] phone [phone] fax I also faxed office notes,demographics to their office as well. awt

## 2017-02-03 NOTE — Progress Notes (Signed)
This encounter was created in error - please disregard.

## 2017-02-10 ENCOUNTER — Non-Acute Institutional Stay (SKILLED_NURSING_FACILITY): Payer: Medicare Other | Admitting: Internal Medicine

## 2017-02-10 ENCOUNTER — Encounter: Payer: Self-pay | Admitting: Internal Medicine

## 2017-02-10 DIAGNOSIS — I5032 Chronic diastolic (congestive) heart failure: Secondary | ICD-10-CM | POA: Diagnosis not present

## 2017-02-10 DIAGNOSIS — I2510 Atherosclerotic heart disease of native coronary artery without angina pectoris: Secondary | ICD-10-CM

## 2017-02-10 DIAGNOSIS — I739 Peripheral vascular disease, unspecified: Secondary | ICD-10-CM | POA: Diagnosis not present

## 2017-02-10 DIAGNOSIS — J42 Unspecified chronic bronchitis: Secondary | ICD-10-CM

## 2017-02-10 DIAGNOSIS — I10 Essential (primary) hypertension: Secondary | ICD-10-CM | POA: Diagnosis not present

## 2017-02-10 DIAGNOSIS — Z9861 Coronary angioplasty status: Secondary | ICD-10-CM

## 2017-02-10 NOTE — Progress Notes (Signed)
Location:    [place] Room Number: 107/W Place of Service:  SNF [phone]) Provider:  Kyung Rudd, Rene Kocher, MD  Patient Care Team: [name], MD as PCP - General (Internal Medicine)  Extended Emergency Contact Information Primary Emergency Contact: Harvel Quale, [place] [address] [place] Mobile Phone: [phone] Relation: Daughter  Code Status:  Full Code Goals of care: Advanced Directive information Advanced Directives 02/10/2017  Does Patient Have a Medical Advance Directive? Yes  Type of Advance Directive (No Data)  Does patient want to make changes to medical advance directive? No - Patient declined  Copy of [place] in Chart? -  Would patient like information on creating a medical advance directive? No - Patient declined  Pre-existing out of facility DNR order (yellow form or pink MOST form) -     Chief Complaint  Patient presents with  . Medical Management of Chronic Issues    Routine Visit    HPI:  Pt is a 61 y.o. male seen today for medical management of chronic diseases.    Patient has H/O Severe Peripheral Artery disease,S/P Right AKA. S/P Left femoral Endarterectomy 02/09/16 , Hypertension , CAD s/p PTCA, CHF, Hyperlipidemia, COPD, Continuous to smoke, Carotid stenosis. And Arthritis on Chronic Narcotics.  Patient had been following up with Vascular surgeon for Venous stasis Ulcer . He was placed on [employer] and he has done well with Ulcer completely healed. He is not in [employer] anymore. He continues to follow with them for his PAD. Both ABI and Carotid Stenosis. He Continues to have chronic Cough but no Sputum. No SOB Fever or chills. Patient continues to smoke and refuses to quit. He also refuses Nebs. Patient gets around with his wheel chair. He has some papules on his Left knee which he wants to follow up with Dermatologist His weight is Increased at 220 Lbs. He weigh  214 lbs in May   Past Medical History:  Diagnosis Date  . Anemia, unspecified   . Arthritis    "shoulders" (09/22/2014)  . Asthma   . Atrial fibrillation ([place])   . Carotid artery occlusion   . CHF (congestive heart failure) ([place])   . COPD 11/27/2007   Qualifier: Diagnosis of  By: Garen Grams    . Coronary artery disease    Cardiac catheterization in 2008 showed 99% stenosis in proximal left circumflex which was treated with Taxus drug-eluting stent. There was mild RCA and LAD disease with normal ejection fraction.  Marland Kitchen ERECTILE DYSFUNCTION 11/27/2007   Qualifier: Diagnosis of  By: Garen Grams    . Gastric ulcer, unspecified as acute or chronic, without mention of hemorrhage, perforation, or obstruction   . Heart murmur   . HYPERLIPIDEMIA 11/27/2007   Qualifier: Diagnosis of  By: Garen Grams    . Hypertension   . HYPERTENSION, BENIGN 05/23/2009   Qualifier: Diagnosis of  By: Melvyn Novas MD, Christena Deem   . Other severe protein-calorie malnutrition   . PAD (peripheral artery disease) (HCC)    Previous right SFA stent in 2003. Directional atherectomy right SFA in 01/2013  . Pneumonia 2000  . Pressure ulcer, lower back(707.03)   . Tobacco use   . Traumatic amputation of leg(s) (complete) (partial), unilateral, at or above knee, without mention of complication    Past Surgical History:  Procedure Laterality Date  . ABDOMINAL AORTAGRAM N/A 02/23/2013   Procedure: ABDOMINAL AORTAGRAM;  Surgeon: Wellington Hampshire, MD;  Location: [facility] CATH LAB;  Service: Cardiovascular;  Laterality: N/A;  . ABDOMINAL AORTAGRAM N/A 09/22/2014   Procedure: ABDOMINAL Maxcine Ham;  Surgeon: Elam Dutch, MD;  Location: [facility] CATH LAB;  Service: Cardiovascular;  Laterality: N/A;  . ACNE CYST REMOVAL    . AMPUTATION Right 09/09/2013   Procedure: AMPUTATION ABOVE KNEE;  Surgeon: Rosetta Posner, MD;  Location: [place];  Service: Vascular;  Laterality: Right;  . ATHERECTOMY N/A 03/02/2013   Procedure: ATHERECTOMY;  Surgeon:  Wellington Hampshire, MD;  Location: [facility] CATH LAB;  Service: Cardiovascular;  Laterality: N/A;  . CARDIAC CATHETERIZATION N/A 09/03/2015   Procedure: Left Heart Cath and Coronary Angiography;  Surgeon: Leonie Man, MD;  Location: [place] CV LAB;  Service: Cardiovascular;  Laterality: N/A;  . CARDIAC CATHETERIZATION N/A 09/03/2015   Procedure: Coronary Balloon Angioplasty;  Surgeon: Leonie Man, MD;  Location: [place] CV LAB;  Service: Cardiovascular;  Laterality: N/A;  . CORONARY ANGIOPLASTY  09/03/2015  . ENDARTERECTOMY Left 08/16/2013   Procedure: Exploration of Left Neck/Fix Bleeding;  Surgeon: Elam Dutch, MD;  Location: [place];  Service: Vascular;  Laterality: Left;  . ENDARTERECTOMY FEMORAL Left 01/30/2016   Procedure: ENDARTERECTOMY LEFT FEMORAL ARTERY;  Surgeon: Elam Dutch, MD;  Location: [facility] OR;  Service: Vascular;  Laterality: Left;  . ESOPHAGOGASTRODUODENOSCOPY N/A 09/08/2013   Procedure: ESOPHAGOGASTRODUODENOSCOPY (EGD);  Surgeon: Cleotis Nipper, MD;  Location: [facility] ENDOSCOPY;  Service: Endoscopy;  Laterality: N/A;  . ESOPHAGOGASTRODUODENOSCOPY (EGD) WITH PROPOFOL N/A 01/05/2014   Procedure: ESOPHAGOGASTRODUODENOSCOPY (EGD) WITH PROPOFOL;  Surgeon: Cleotis Nipper, MD;  Location: [facility] ENDOSCOPY;  Service: Endoscopy;  Laterality: N/A;  . FEMORAL ARTERY STENT Right 2003   [name] 09/22/2014  . FLEXIBLE SIGMOIDOSCOPY N/A 09/08/2013   Procedure: FLEXIBLE SIGMOIDOSCOPY;  Surgeon: Cleotis Nipper, MD;  Location: [facility] ENDOSCOPY;  Service: Endoscopy;  Laterality: N/A;  unprepp  . ILIAC ARTERY STENT Left 09/22/2014  . NASAL SINUS SURGERY  2004  . PAROTIDECTOMY Right 03/29/2014   Procedure: PAROTIDECTOMY;  Surgeon: Ascencion Dike, MD;  Location: [place];  Service: ENT;  Laterality: Right;  . PATCH ANGIOPLASTY Left 01/30/2016   Procedure: LEFT FEMORAL ARTYERY PATCH ANGIOPLASTY USING HEMASHIELD PLATINUM FINESSE PATCH;  Surgeon: Elam Dutch, MD;  Location: [place];  Service: Vascular;   Laterality: Left;  . PERIPHERAL VASCULAR CATHETERIZATION N/A 08/17/2015   Procedure: Abdominal Aortogram;  Surgeon: Elam Dutch, MD;  Location: [place] CV LAB;  Service: Cardiovascular;  Laterality: N/A;    Allergies  Allergen Reactions  . Codeine Nausea Only    Outpatient Encounter Prescriptions as of 02/10/2017  Medication Sig  . aspirin EC 81 MG tablet Take 1 tablet (81 mg total) by mouth daily.  . cloNIDine (CATAPRES) 0.2 MG tablet Take 0.2 mg by mouth 2 (two) times daily.  . clopidogrel (PLAVIX) 75 MG tablet Take 75 mg by mouth daily. @@ 9:00 pm  . colchicine 0.6 MG tablet Take 0.3 mg by mouth daily.   . Fluticasone-Salmeterol (ADVAIR) 250-50 MCG/DOSE AEPB Inhale 1 puff into the lungs 2 (two) times daily.  . furosemide (LASIX) 20 MG tablet Take 20 mg by mouth daily. Sunday, Tuesday, Thursday, Saturday  . furosemide (LASIX) 40 MG tablet Take 40 mg by mouth daily. Mon,Wed,Fri  . isosorbide mononitrate (IMDUR) 30 MG 24 hr tablet Take 1 tablet (30 mg total) by mouth daily.  Marland Kitchen lisinopril (PRINIVIL,ZESTRIL) 40 MG tablet Take 40 mg by mouth daily. Reported  on 11/22/2015  . metoprolol tartrate (LOPRESSOR) 25 MG tablet Take 12.5 mg by mouth 2 (two) times daily.  Marland Kitchen oxyCODONE (ROXICODONE) 15 MG immediate release tablet Take one tablet by mouth every 4 hours as needed for pain HOLD for sedation, resp depression  . pantoprazole (PROTONIX) 40 MG tablet Take 40 mg by mouth daily.  . potassium chloride SA (K-DUR,KLOR-CON) 20 MEQ tablet Take 20 mEq by mouth daily.   . simvastatin (ZOCOR) 40 MG tablet Take 40 mg by mouth daily.  . temazepam (RESTORIL) 30 MG capsule Take one capsule by mouth at bedtime for rest. Hold for sedation or respiratory depression   No facility-administered encounter medications on file as of 02/10/2017.      Review of Systems  Review of Systems  Constitutional: Negative for activity change, appetite change, chills, diaphoresis, fatigue and fever.  HENT: Negative  for mouth sores, postnasal drip, rhinorrhea, sinus pain and sore throat.   Respiratory: Negative for apnea,  chest tightness, shortness of breath and wheezing.  Positive for Cough Cardiovascular: Negative for chest pain, palpitations and Positive for leg swelling.  Gastrointestinal: Negative for abdominal distention, abdominal pain, constipation, diarrhea, nausea and vomiting.  Genitourinary: Negative for dysuria and frequency.  Musculoskeletal: Negative for arthralgias, joint swelling and myalgias.  Skin: Negative for rash. Positive for some papules around the Knee Neurological: Negative for dizziness, syncope, weakness, light-headedness and numbness.  Psychiatric/Behavioral: Negative for behavioral problems, confusion and sleep disturbance.     Immunization History  Administered Date(s) Administered  . DTP 05/28/2001  . Influenza-Unspecified 05/26/2013, 05/04/2014, 04/29/2016  . Pneumococcal-Unspecified 07/28/2009, 05/06/2016  . Tdap 12/30/2012   Pertinent  Health Maintenance Due  Topic Date Due  . COLONOSCOPY  11/24/2006  . INFLUENZA VACCINE  02/25/2017   Fall Risk  12/30/2012  Falls in the past year? No   Functional Status Survey:    Vitals:   02/10/17 0857  BP: 122/79  Pulse: 60  Resp: 20  Temp: 97.9 F (36.6 C)  TempSrc: Oral  Weight: 220 lb (99.8 kg)  Height: 5\' 8"  (1.727 m)   Body mass index is 33.45 kg/m. Physical Exam  Constitutional: He is oriented to person, place, and time. He appears well-developed and well-nourished.  HENT:  Head: Normocephalic.  Mouth/Throat: Oropharynx is clear and moist.  Eyes: Pupils are equal, round, and reactive to light.  Neck: Neck supple.  Cardiovascular: Normal rate and regular rhythm.   Pulmonary/Chest: Effort normal. He has wheezes.  Has expiratory Wheezing B/L  Abdominal: Soft. Bowel sounds are normal. He exhibits no distension. There is no tenderness. There is no rebound.  Neurological: He is alert and oriented to  person, place, and time.  Skin: Skin is warm and dry.  His Leg looks much better with healed Ulcer and Blisters almost disappear  Psychiatric: He has a normal mood and affect. His behavior is normal. Thought content normal.    Labs reviewed:  Recent Labs  11/04/16 0700 11/11/16 0730 12/16/16 0731  NA 138 133* 137  K 4.9 4.0 3.8  CL 97* 96* 93*  CO2 34* 28 32  GLUCOSE 105* 79 91  BUN 25* 29* 29*  CREATININE 1.62* 1.60* 1.52*  CALCIUM 9.9 9.0 9.6    Recent Labs  10/24/16 0212  AST 18  ALT 13*  ALKPHOS 51  BILITOT 0.7  PROT 6.8  ALBUMIN 4.0    Recent Labs  07/22/16 0700 10/24/16 0212 11/11/16 0730 12/16/16 0731  WBC 6.4 7.9 7.0 7.8  NEUTROABS 3.8  --  4.0 4.9  HGB 15.1 14.6 16.2 15.5  HCT 46.6 43.9 48.6 47.6  MCV 90.8 89.4 90.7 92.2  PLT 154 135* 170 180   Lab Results  Component Value Date   TSH 4.446 10/24/2016   Lab Results  Component Value Date   HGBA1C 5.6 10/24/2016   Lab Results  Component Value Date   CHOL 135 10/24/2016   HDL 29 (L) 10/24/2016   LDLCALC 69 10/24/2016   TRIG 186 (H) 10/24/2016   CHOLHDL 4.7 10/24/2016    Significant Diagnostic Results in last 30 days:  No results found.  Assessment/Plan PAD (peripheral artery disease)  Continue on Aspirin and Plavix. Also on statin with LDL of 67 Follow up with Vascular surgery  Chronic bronchitis, Patient continues to smoke and has chronic cough with wheezing. He refuses for any nebs. Will start him On Spiriva. He has finally agreed for that. Continue Advair  Essential hypertension Patient BP is stable He is on metoprolol, Lisinopril, Lasix and clonidine.  Hyperlipidemia  Continue on Statin. LDL 69 in 03/18 Will repeat Fasting Profile  Insomnia Continue Restoril 30 mg Current smoker Continues to refuse to quit.  Coronary artery disease  Continue on aspirin and isosorbide.  Chronic Kidney disease.Stage 3 Creat stable at 1.52  Skin Blisters Patient has  appointment with Dermatologist  Chronic diastolic congestive heart failure  Patient stable on Lasix. His Echo in 02/15 was 55 % Weight Gain Will D/W Pharmacy about his portion control Family/ staff Communication:   Labs/tests ordered:   CBC, CMP, Fasting lipid  Total time spent in this patient care encounter was 25_ minutes; greater than 50% of the visit spent counseling patient and coordinating care for problems addressed at this encounter.

## 2017-02-19 ENCOUNTER — Encounter: Payer: Self-pay | Admitting: Internal Medicine

## 2017-02-19 ENCOUNTER — Non-Acute Institutional Stay (SKILLED_NURSING_FACILITY): Payer: Medicare Other | Admitting: Internal Medicine

## 2017-02-19 DIAGNOSIS — G4700 Insomnia, unspecified: Secondary | ICD-10-CM | POA: Diagnosis not present

## 2017-02-19 DIAGNOSIS — J42 Unspecified chronic bronchitis: Secondary | ICD-10-CM | POA: Diagnosis not present

## 2017-02-19 NOTE — Progress Notes (Signed)
Location:   [place] Room Number: 107/W Place of Service:  SNF [phone]) Provider:  Freddi Starr, MD   Patient Care Team: [name], MD as PCP - General (Internal Medicine)  Extended Emergency Contact Information Primary Emergency Contact: [place], [place] [address] [place] Mobile Phone: [phone] Relation: Daughter  Code Status:  Full Code Goals of care: Advanced Directive information Advanced Directives 02/19/2017  Does Patient Have a Medical Advance Directive? Yes  Type of Advance Directive (No Data)  Does patient want to make changes to medical advance directive? No - Patient declined  Copy of [place] in Chart? -  Would patient like information on creating a medical advance directive? No - Patient declined  Pre-existing out of facility DNR order (yellow form or pink MOST form) -     Chief Complaint  Patient presents with  . Acute Visit    Insomnia  Follow-up COPD  HPI:  Pt is a 61 y.o. male seen today for an acute visit for insomnia control also followed for COPD.   Patient was recently seen by Dr. Lyndel Safe for routine visit-he does have a history COPD but continues to smoke and has a chronic cough does not complain of any fever chills or shortness of breath.  He has refused to quit and also his nebulizers he refuses. However Dr. Steve Rattler did start himIncruse Ellipta which apparently he is receiving every morning.  He also has a history of persistent insomnia has been on temazepam which was increased up to 30 mg daily at bedtime-however apparently his insurance will not pay for this anymore and we are looking for alternatives.  I did discuss this with him and said at one point he had been on Valium also on point had been on Xanax Xanax dose was 2 mg-he says both of these appear to be fairly effective.         Past Medical History:  Diagnosis Date  . Anemia,  unspecified   . Arthritis    "shoulders" (09/22/2014)  . Asthma   . Atrial fibrillation ([place])   . Carotid artery occlusion   . CHF (congestive heart failure) ([place])   . COPD 11/27/2007   Qualifier: Diagnosis of  By: Garen Grams    . Coronary artery disease    Cardiac catheterization in 2008 showed 99% stenosis in proximal left circumflex which was treated with Taxus drug-eluting stent. There was mild RCA and LAD disease with normal ejection fraction.  Marland Kitchen ERECTILE DYSFUNCTION 11/27/2007   Qualifier: Diagnosis of  By: Garen Grams    . Gastric ulcer, unspecified as acute or chronic, without mention of hemorrhage, perforation, or obstruction   . Heart murmur   . HYPERLIPIDEMIA 11/27/2007   Qualifier: Diagnosis of  By: Garen Grams    . Hypertension   . HYPERTENSION, BENIGN 05/23/2009   Qualifier: Diagnosis of  By: Melvyn Novas MD, Christena Deem   . Other severe protein-calorie malnutrition   . PAD (peripheral artery disease) (HCC)    Previous right SFA stent in 2003. Directional atherectomy right SFA in 01/2013  . Pneumonia 2000  . Pressure ulcer, lower back(707.03)   . Tobacco use   . Traumatic amputation of leg(s) (complete) (partial), unilateral, at or above knee, without mention of complication    Past Surgical History:  Procedure Laterality Date  . ABDOMINAL AORTAGRAM N/A 02/23/2013   Procedure: ABDOMINAL AORTAGRAM;  Surgeon: Wellington Hampshire, MD;  Location: [facility] CATH LAB;  Service: Cardiovascular;  Laterality: N/A;  . ABDOMINAL AORTAGRAM N/A 09/22/2014   Procedure: ABDOMINAL Maxcine Ham;  Surgeon: Elam Dutch, MD;  Location: [facility] CATH LAB;  Service: Cardiovascular;  Laterality: N/A;  . ACNE CYST REMOVAL    . AMPUTATION Right 09/09/2013   Procedure: AMPUTATION ABOVE KNEE;  Surgeon: Rosetta Posner, MD;  Location: [place];  Service: Vascular;  Laterality: Right;  . ATHERECTOMY N/A 03/02/2013   Procedure: ATHERECTOMY;  Surgeon: Wellington Hampshire, MD;  Location: [facility] CATH LAB;  Service:  Cardiovascular;  Laterality: N/A;  . CARDIAC CATHETERIZATION N/A 09/03/2015   Procedure: Left Heart Cath and Coronary Angiography;  Surgeon: Leonie Man, MD;  Location: [place] CV LAB;  Service: Cardiovascular;  Laterality: N/A;  . CARDIAC CATHETERIZATION N/A 09/03/2015   Procedure: Coronary Balloon Angioplasty;  Surgeon: Leonie Man, MD;  Location: [place] CV LAB;  Service: Cardiovascular;  Laterality: N/A;  . CORONARY ANGIOPLASTY  09/03/2015  . ENDARTERECTOMY Left 08/16/2013   Procedure: Exploration of Left Neck/Fix Bleeding;  Surgeon: Elam Dutch, MD;  Location: [place];  Service: Vascular;  Laterality: Left;  . ENDARTERECTOMY FEMORAL Left 01/30/2016   Procedure: ENDARTERECTOMY LEFT FEMORAL ARTERY;  Surgeon: Elam Dutch, MD;  Location: [facility] OR;  Service: Vascular;  Laterality: Left;  . ESOPHAGOGASTRODUODENOSCOPY N/A 09/08/2013   Procedure: ESOPHAGOGASTRODUODENOSCOPY (EGD);  Surgeon: Cleotis Nipper, MD;  Location: [facility] ENDOSCOPY;  Service: Endoscopy;  Laterality: N/A;  . ESOPHAGOGASTRODUODENOSCOPY (EGD) WITH PROPOFOL N/A 01/05/2014   Procedure: ESOPHAGOGASTRODUODENOSCOPY (EGD) WITH PROPOFOL;  Surgeon: Cleotis Nipper, MD;  Location: [facility] ENDOSCOPY;  Service: Endoscopy;  Laterality: N/A;  . FEMORAL ARTERY STENT Right 2003   [name] 09/22/2014  . FLEXIBLE SIGMOIDOSCOPY N/A 09/08/2013   Procedure: FLEXIBLE SIGMOIDOSCOPY;  Surgeon: Cleotis Nipper, MD;  Location: [facility] ENDOSCOPY;  Service: Endoscopy;  Laterality: N/A;  unprepp  . ILIAC ARTERY STENT Left 09/22/2014  . NASAL SINUS SURGERY  2004  . PAROTIDECTOMY Right 03/29/2014   Procedure: PAROTIDECTOMY;  Surgeon: Ascencion Dike, MD;  Location: [place];  Service: ENT;  Laterality: Right;  . PATCH ANGIOPLASTY Left 01/30/2016   Procedure: LEFT FEMORAL ARTYERY PATCH ANGIOPLASTY USING HEMASHIELD PLATINUM FINESSE PATCH;  Surgeon: Elam Dutch, MD;  Location: [place];  Service: Vascular;  Laterality: Left;  . PERIPHERAL VASCULAR CATHETERIZATION N/A  08/17/2015   Procedure: Abdominal Aortogram;  Surgeon: Elam Dutch, MD;  Location: [place] CV LAB;  Service: Cardiovascular;  Laterality: N/A;    Allergies  Allergen Reactions  . Codeine Nausea Only    Outpatient Encounter Prescriptions as of 02/19/2017  Medication Sig  . aspirin EC 81 MG tablet Take 1 tablet (81 mg total) by mouth daily.  . cloNIDine (CATAPRES) 0.2 MG tablet Take 0.2 mg by mouth 2 (two) times daily.  . clopidogrel (PLAVIX) 75 MG tablet Take 75 mg by mouth daily. @@ 9:00 pm  . colchicine 0.6 MG tablet Take 0.3 mg by mouth daily.   . furosemide (LASIX) 20 MG tablet Take 20 mg by mouth daily. Sunday, Tuesday, Thursday, Saturday  . furosemide (LASIX) 40 MG tablet Take 40 mg by mouth daily. Mon,Wed,Fri  . isosorbide mononitrate (IMDUR) 30 MG 24 hr tablet Take 1 tablet (30 mg total) by mouth daily.  Marland Kitchen lisinopril (PRINIVIL,ZESTRIL) 40 MG tablet Take 40 mg by mouth daily. Reported on 11/22/2015  . metoprolol tartrate (LOPRESSOR) 25 MG tablet Take 12.5 mg by mouth 2 (two)  times daily.  Marland Kitchen oxyCODONE (ROXICODONE) 15 MG immediate release tablet Take one tablet by mouth every 4 hours as needed for pain HOLD for sedation, resp depression  . pantoprazole (PROTONIX) 40 MG tablet Take 40 mg by mouth daily.  . potassium chloride SA (K-DUR,KLOR-CON) 20 MEQ tablet Take 20 mEq by mouth daily.   . simvastatin (ZOCOR) 40 MG tablet Take 40 mg by mouth daily.  . temazepam (RESTORIL) 30 MG capsule Take one capsule by mouth at bedtime for rest. Hold for sedation or respiratory depression  . umeclidinium bromide (INCRUSE ELLIPTA) 62.5 MCG/INH AEPB Inhale 1 puff into the lungs daily.  . [DISCONTINUED] Fluticasone-Salmeterol (ADVAIR) 250-50 MCG/DOSE AEPB Inhale 1 puff into the lungs 2 (two) times daily.   No facility-administered encounter medications on file as of 02/19/2017.     Review of Systems   General is not complaining of any fever or chills.  Respiratory does have a somewhat  chronic cough does not complain of any shortness of breath however.  Cardiac does not complaining of any chest pain lower extremity edema actually appears to be improved he is wearing hose.  GI is not complaining of any nausea vomiting diarrhea constipation or abdominal discomfort.  Muscle skeletal does have a history of chronic diffuse osteoarthritic pain and takes oxycodone frequently apparently with relief.  Neurologic does not complain of any dizziness headache or syncopal episodes.  Psych history of persistent insomnia day says is controlled currently on the temazepam 30 mg.    Immunization History  Administered Date(s) Administered  . DTP 05/28/2001  . Influenza-Unspecified 05/26/2013, 05/04/2014, 04/29/2016  . Pneumococcal-Unspecified 07/28/2009, 05/06/2016  . Tdap 12/30/2012   Pertinent  Health Maintenance Due  Topic Date Due  . COLONOSCOPY  03/20/2017 (Originally 11/24/2006)  . INFLUENZA VACCINE  02/25/2017   Fall Risk  12/30/2012  Falls in the past year? No   Functional Status Survey:    Temperature is 98.1 pulse 62 respirations 20 blood pressure 139/75.   Physical Exam   In general this is a pleasant middle-aged male in no distress sitting comfortably in his wheelchair.  His skin is warm and dry-does have a history of left leg blisters and also which apparently have almost resolved areas currently covered with hose  Oropharynx is clear mucous membranes moist.  Chest he has shallow air entry with some scattered wheezing there is no labored breathing.  Heart is regular rate and rhythm somewhat distant heart sounds without murmur gallop or rub edema appears to be improved on the left lower extremity.  Abdomen somewhat obese soft nontender positive bowel sounds.  Muscle skeletal is status post right above-the-knee amputation-again left leg currently has hose applied edema appears to be somewhat improved strength appears intact upper extremities and left lower  extremity.  Neurologic associated tachycardia speech is clear no lateralizing findings.  Psych he is alert and oriented pleasant and appropriate   Labs reviewed:  Recent Labs  11/04/16 0700 11/11/16 0730 12/16/16 0731  NA 138 133* 137  K 4.9 4.0 3.8  CL 97* 96* 93*  CO2 34* 28 32  GLUCOSE 105* 79 91  BUN 25* 29* 29*  CREATININE 1.62* 1.60* 1.52*  CALCIUM 9.9 9.0 9.6    Recent Labs  10/24/16 0212  AST 18  ALT 13*  ALKPHOS 51  BILITOT 0.7  PROT 6.8  ALBUMIN 4.0    Recent Labs  07/22/16 0700 10/24/16 0212 11/11/16 0730 12/16/16 0731  WBC 6.4 7.9 7.0 7.8  NEUTROABS 3.8  --  4.0 4.9  HGB 15.1 14.6 16.2 15.5  HCT 46.6 43.9 48.6 47.6  MCV 90.8 89.4 90.7 92.2  PLT 154 135* 170 180   Lab Results  Component Value Date   TSH 4.446 10/24/2016   Lab Results  Component Value Date   HGBA1C 5.6 10/24/2016   Lab Results  Component Value Date   CHOL 135 10/24/2016   HDL 29 (L) 10/24/2016   LDLCALC 69 10/24/2016   TRIG 186 (H) 10/24/2016   CHOLHDL 4.7 10/24/2016    Significant Diagnostic Results in last 30 days:  No results found.  Assessment/Plan  #1-history of COPD again he is now taking Incruse Ellipta--he appears to be essentially at baseline still has some wheezing which I suspect will be chronic he has refusesd  nebulizersnonetheless he is not complaining of shortness of breath or any instability  He continues to smoke.  #2-history of insomnia again insurance will not pay for current dose of temazepam I discussed this fairly extensively at bedside and he is open to trying Xanax or Valium-he would prefer Xanax at 2 mg a day but we will restart 1 mg a day and monitor and he expressed understanding and approval-.  [id number]

## 2017-02-24 ENCOUNTER — Encounter (HOSPITAL_COMMUNITY)
Admission: RE | Admit: 2017-02-24 | Discharge: 2017-02-24 | Disposition: A | Discharge status: Home / Self Care | Payer: Medicare Other | Source: Skilled Nursing Facility | Attending: Internal Medicine | Admitting: Internal Medicine

## 2017-02-24 DIAGNOSIS — J449 Chronic obstructive pulmonary disease, unspecified: Secondary | ICD-10-CM | POA: Diagnosis not present

## 2017-02-24 DIAGNOSIS — K259 Gastric ulcer, unspecified as acute or chronic, without hemorrhage or perforation: Secondary | ICD-10-CM | POA: Insufficient documentation

## 2017-02-24 DIAGNOSIS — Z89511 Acquired absence of right leg below knee: Secondary | ICD-10-CM | POA: Insufficient documentation

## 2017-02-24 DIAGNOSIS — G4700 Insomnia, unspecified: Secondary | ICD-10-CM | POA: Diagnosis not present

## 2017-02-24 DIAGNOSIS — E785 Hyperlipidemia, unspecified: Secondary | ICD-10-CM | POA: Insufficient documentation

## 2017-02-24 DIAGNOSIS — I739 Peripheral vascular disease, unspecified: Secondary | ICD-10-CM | POA: Insufficient documentation

## 2017-02-24 LAB — COMPREHENSIVE METABOLIC PANEL
ALK PHOS: 50 U/L (ref 38–126)
ALT: 13 U/L — AB (ref 17–63)
AST: 14 U/L — ABNORMAL LOW (ref 15–41)
Albumin: 3.9 g/dL (ref 3.5–5.0)
Anion gap: 9 (ref 5–15)
BILIRUBIN TOTAL: 0.6 mg/dL (ref 0.3–1.2)
BUN: 30 mg/dL — ABNORMAL HIGH (ref 6–20)
CALCIUM: 8.9 mg/dL (ref 8.9–10.3)
CO2: 31 mmol/L (ref 22–32)
CREATININE: 1.43 mg/dL — AB (ref 0.61–1.24)
Chloride: 95 mmol/L — ABNORMAL LOW (ref 101–111)
GFR calc non Af Amer: 52 mL/min — ABNORMAL LOW (ref >60)
GLUCOSE: 108 mg/dL — AB (ref 65–99)
Potassium: 4.2 mmol/L (ref 3.5–5.1)
Sodium: 135 mmol/L (ref 135–145)
TOTAL PROTEIN: 7 g/dL (ref 6.5–8.1)

## 2017-02-24 LAB — CBC WITH DIFFERENTIAL/PLATELET
Basophils Absolute: 0 10*3/uL (ref 0.0–0.1)
Basophils Relative: 1 %
Eosinophils Absolute: 0.2 10*3/uL (ref 0.0–0.7)
Eosinophils Relative: 2 %
HEMATOCRIT: 47.7 % (ref 39.0–52.0)
HEMOGLOBIN: 15.7 g/dL (ref 13.0–17.0)
LYMPHS ABS: 1.7 10*3/uL (ref 0.7–4.0)
LYMPHS PCT: 24 %
MCH: 29.8 pg (ref 26.0–34.0)
MCHC: 32.9 g/dL (ref 30.0–36.0)
MCV: 90.7 fL (ref 78.0–100.0)
MONOS PCT: 8 %
Monocytes Absolute: 0.6 10*3/uL (ref 0.1–1.0)
NEUTROS ABS: 4.5 10*3/uL (ref 1.7–7.7)
NEUTROS PCT: 65 %
Platelets: 149 10*3/uL — ABNORMAL LOW (ref 150–400)
RBC: 5.26 MIL/uL (ref 4.22–5.81)
RDW: 15 % (ref 11.5–15.5)
WBC: 6.9 10*3/uL (ref 4.0–10.5)

## 2017-02-24 LAB — LIPID PANEL
CHOL/HDL RATIO: 4.4 ratio
Cholesterol: 131 mg/dL (ref 0–200)
HDL: 30 mg/dL — ABNORMAL LOW (ref >40)
LDL Cholesterol: 53 mg/dL (ref 0–99)
Triglycerides: 241 mg/dL — ABNORMAL HIGH (ref <150)
VLDL: 48 mg/dL — ABNORMAL HIGH (ref 0–40)

## 2017-03-09 ENCOUNTER — Encounter: Payer: Self-pay | Admitting: Internal Medicine

## 2017-03-09 ENCOUNTER — Non-Acute Institutional Stay (SKILLED_NURSING_FACILITY): Payer: Medicare Other | Admitting: Internal Medicine

## 2017-03-09 DIAGNOSIS — I2510 Atherosclerotic heart disease of native coronary artery without angina pectoris: Secondary | ICD-10-CM

## 2017-03-09 DIAGNOSIS — I10 Essential (primary) hypertension: Secondary | ICD-10-CM | POA: Diagnosis not present

## 2017-03-09 DIAGNOSIS — Z9861 Coronary angioplasty status: Secondary | ICD-10-CM

## 2017-03-09 DIAGNOSIS — G4700 Insomnia, unspecified: Secondary | ICD-10-CM

## 2017-03-09 DIAGNOSIS — J42 Unspecified chronic bronchitis: Secondary | ICD-10-CM | POA: Diagnosis not present

## 2017-03-09 DIAGNOSIS — M19011 Primary osteoarthritis, right shoulder: Secondary | ICD-10-CM

## 2017-03-09 DIAGNOSIS — I739 Peripheral vascular disease, unspecified: Secondary | ICD-10-CM | POA: Diagnosis not present

## 2017-03-09 DIAGNOSIS — N289 Disorder of kidney and ureter, unspecified: Secondary | ICD-10-CM

## 2017-03-09 NOTE — Progress Notes (Signed)
Location:   [facility] Room Number: 107/W Place of Service:  SNF [phone]) Provider:  Gardiner Fanti, Rene Kocher, MD  Patient Care Team: [name], MD as PCP - General (Internal Medicine)  Extended Emergency Contact Information Primary Emergency Contact: [place], [place] [address] [place] Mobile Phone: [phone] Relation: Daughter  Code Status:  Full Code Goals of care: Advanced Directive information Advanced Directives 03/09/2017  Does Patient Have a Medical Advance Directive? -  Type of Advance Directive (No Data)  Does patient want to make changes to medical advance directive? No - Patient declined  Copy of [place] in Chart? -  Would patient like information on creating a medical advance directive? No - Patient declined  Pre-existing out of facility DNR order (yellow form or pink MOST form) -     Chief Complaint  Patient presents with  . Medical Management of Chronic Issues    Routine Visit  Medical management of chronic medical conditions including peripheral arterial disease status post right AKA-hypertension-coronary artery disease-CHF-hyperlipidemia-COPD-osteoarthritis-insomnia-chronic kidney disease-  HPI:  Pt is a 61 y.o. male seen today for medical management of chronic diseases.  As noted above.  He appears to be doing well-initially has had significant weight gain but this appears to have stabilized-current weight is 216 prior weight several range up to 220.  He does eat well.  He also has a history of COPD but has decided to still continue to smoke-he is on a Incruse Elloipta but apparently refuses nebulizers nonetheless sees says his respiratory status is stable does not really complain of shortness of breath does have somewhat of a chronic cough.  He has a significant history of peripheral arterial disease status post right AKA-also status post left femoral endarterectomy-he  is followed by vascular.  He has a carotid duplex as well as a left iliac artery stent duplex and left ABIs do in November.  He is currently not complaining of significant pain.  He does have some edema and his left leg he does have close applied and this appears to be helping he is also on Lasix with a history of congestive heart failure.  This appears to be stabilized.  In regards osteoarthritis he continues on oxycodone 15 mg every 4 hours he takes these regularly and is quite insistent he needs this.  He also has a history of partial parathyroidectomy procedure did show clear margins he has refused follow-up saying he does not feel he really needs this.  Regards to hyperlipidemia he is on a statin LDL was 67 on most recent lab.  His main complaint has been insomnia recently his Restoril was discontinued secondary to insurance coverage not covering his 30 mg of Restoril.  He has been on Xanax previously he was started on Xanax 1 mg but he says this is not effective-he has requested 2 mg I did discuss this with Dr. Lyndel Safe and instead we'll continue him on the lower dose of Xanax 1 mg and add low-dose temazepam 7.5 mg daily at bedtime both these medications are when necessary he apparently takes it quite regularly.     Past Medical History:  Diagnosis Date  . Anemia, unspecified   . Arthritis    "shoulders" (09/22/2014)  . Asthma   . Atrial fibrillation ([facility])   . Carotid artery occlusion   . CHF (congestive heart failure) ([facility])   . COPD 11/27/2007   Qualifier:  Diagnosis of  By: Garen Grams    . Coronary artery disease    Cardiac catheterization in 2008 showed 99% stenosis in proximal left circumflex which was treated with Taxus drug-eluting stent. There was mild RCA and LAD disease with normal ejection fraction.  Marland Kitchen ERECTILE DYSFUNCTION 11/27/2007   Qualifier: Diagnosis of  By: Garen Grams    . Gastric ulcer, unspecified as acute or chronic, without mention of hemorrhage,  perforation, or obstruction   . Heart murmur   . HYPERLIPIDEMIA 11/27/2007   Qualifier: Diagnosis of  By: Garen Grams    . Hypertension   . HYPERTENSION, BENIGN 05/23/2009   Qualifier: Diagnosis of  By: Melvyn Novas MD, Christena Deem   . Other severe protein-calorie malnutrition   . PAD (peripheral artery disease) (H[facility])    Previous right SFA stent in 2003. Directional atherectomy right SFA in 01/2013  . Pneumonia 2000  . Pressure ulcer, lower back(707.03)   . Tobacco use   . Traumatic amputation of leg(s) (complete) (partial), unilateral, at or above knee, without mention of complication    Past Surgical History:  Procedure Laterality Date  . ABDOMINAL AORTAGRAM N/A 02/23/2013   Procedure: ABDOMINAL Maxcine Ham;  Surgeon: Wellington Hampshire, MD;  Location: [facility] CATH LAB;  Service: Cardiovascular;  Laterality: N/A;  . ABDOMINAL AORTAGRAM N/A 09/22/2014   Procedure: ABDOMINAL Maxcine Ham;  Surgeon: Elam Dutch, MD;  Location: [facility] CATH LAB;  Service: Cardiovascular;  Laterality: N/A;  . ACNE CYST REMOVAL    . AMPUTATION Right 09/09/2013   Procedure: AMPUTATION ABOVE KNEE;  Surgeon: Rosetta Posner, MD;  Location: [facility];  Service: Vascular;  Laterality: Right;  . ATHERECTOMY N/A 03/02/2013   Procedure: ATHERECTOMY;  Surgeon: Wellington Hampshire, MD;  Location: [facility] CATH LAB;  Service: Cardiovascular;  Laterality: N/A;  . CARDIAC CATHETERIZATION N/A 09/03/2015   Procedure: Left Heart Cath and Coronary Angiography;  Surgeon: Leonie Man, MD;  Location: [facility] CV LAB;  Service: Cardiovascular;  Laterality: N/A;  . CARDIAC CATHETERIZATION N/A 09/03/2015   Procedure: Coronary Balloon Angioplasty;  Surgeon: Leonie Man, MD;  Location: [facility] CV LAB;  Service: Cardiovascular;  Laterality: N/A;  . CORONARY ANGIOPLASTY  09/03/2015  . ENDARTERECTOMY Left 08/16/2013   Procedure: Exploration of Left Neck/Fix Bleeding;  Surgeon: Elam Dutch, MD;  Location: [facility];  Service: Vascular;  Laterality: Left;  .  ENDARTERECTOMY FEMORAL Left 01/30/2016   Procedure: ENDARTERECTOMY LEFT FEMORAL ARTERY;  Surgeon: Elam Dutch, MD;  Location: [facility] OR;  Service: Vascular;  Laterality: Left;  . ESOPHAGOGASTRODUODENOSCOPY N/A 09/08/2013   Procedure: ESOPHAGOGASTRODUODENOSCOPY (EGD);  Surgeon: Cleotis Nipper, MD;  Location: [facility] ENDOSCOPY;  Service: Endoscopy;  Laterality: N/A;  . ESOPHAGOGASTRODUODENOSCOPY (EGD) WITH PROPOFOL N/A 01/05/2014   Procedure: ESOPHAGOGASTRODUODENOSCOPY (EGD) WITH PROPOFOL;  Surgeon: Cleotis Nipper, MD;  Location: [facility] ENDOSCOPY;  Service: Endoscopy;  Laterality: N/A;  . FEMORAL ARTERY STENT Right 2003   [name] 09/22/2014  . FLEXIBLE SIGMOIDOSCOPY N/A 09/08/2013   Procedure: FLEXIBLE SIGMOIDOSCOPY;  Surgeon: Cleotis Nipper, MD;  Location: [facility] ENDOSCOPY;  Service: Endoscopy;  Laterality: N/A;  unprepp  . ILIAC ARTERY STENT Left 09/22/2014  . NASAL SINUS SURGERY  2004  . PAROTIDECTOMY Right 03/29/2014   Procedure: PAROTIDECTOMY;  Surgeon: Ascencion Dike, MD;  Location: [facility];  Service: ENT;  Laterality: Right;  . PATCH ANGIOPLASTY Left 01/30/2016   Procedure: LEFT FEMORAL ARTYERY PATCH ANGIOPLASTY USING HEMASHIELD PLATINUM FINESSE PATCH;  Surgeon: Elam Dutch, MD;  Location: [facility]  OR;  Service: Vascular;  Laterality: Left;  . PERIPHERAL VASCULAR CATHETERIZATION N/A 08/17/2015   Procedure: Abdominal Aortogram;  Surgeon: Elam Dutch, MD;  Location: [facility] CV LAB;  Service: Cardiovascular;  Laterality: N/A;    Allergies  Allergen Reactions  . Codeine Nausea Only    Outpatient Encounter Prescriptions as of 03/09/2017  Medication Sig  . ALPRAZolam (XANAX) 1 MG tablet Take 1 mg by mouth at bedtime as needed for anxiety.  Marland Kitchen aspirin EC 81 MG tablet Take 1 tablet (81 mg total) by mouth daily.  . cloNIDine (CATAPRES) 0.2 MG tablet Take 0.2 mg by mouth 2 (two) times daily.  . clopidogrel (PLAVIX) 75 MG tablet Take 75 mg by mouth daily. @@ 9:00 pm  . colchicine 0.6 MG tablet Take 0.3 mg by  mouth daily.   . furosemide (LASIX) 20 MG tablet Take 20 mg by mouth daily. Sunday, Tuesday, Thursday, Saturday  . furosemide (LASIX) 40 MG tablet Take 40 mg by mouth daily. Mon,Wed,Fri  . isosorbide mononitrate (IMDUR) 30 MG 24 hr tablet Take 1 tablet (30 mg total) by mouth daily.  Marland Kitchen lisinopril (PRINIVIL,ZESTRIL) 40 MG tablet Take 40 mg by mouth daily. Reported on 11/22/2015  . metoprolol tartrate (LOPRESSOR) 25 MG tablet Take 12.5 mg by mouth 2 (two) times daily.  Marland Kitchen oxyCODONE (ROXICODONE) 15 MG immediate release tablet Take one tablet by mouth every 4 hours as needed for pain HOLD for sedation, resp depression  . pantoprazole (PROTONIX) 40 MG tablet Take 40 mg by mouth daily.  . potassium chloride SA (K-DUR,KLOR-CON) 20 MEQ tablet Take 20 mEq by mouth daily.   . simvastatin (ZOCOR) 40 MG tablet Take 40 mg by mouth daily.  Marland Kitchen umeclidinium bromide (INCRUSE ELLIPTA) 62.5 MCG/INH AEPB Inhale 1 puff into the lungs daily.  . [DISCONTINUED] temazepam (RESTORIL) 30 MG capsule Take one capsule by mouth at bedtime for rest. Hold for sedation or respiratory depression   No facility-administered encounter medications on file as of 03/09/2017.      Review of Systems General is not complaining of any fever or chills. Weight appears to be stabilized.  Skin does have a history of blisters on his left leg dermatology consult is pending these tend to come and go  Head ears eyes nose mouth and throat does not complaining of any visual changes or sore throat  Respiratory does have a somewhat chronic cough does not complain of any shortness of breath however.  Cardiac does not complaining of any chest pain lower extremity edema actually appears to be improved he is wearing hose.  GI is not complaining of any nausea vomiting diarrhea constipation or abdominal discomfort.  Muscle skeletal does have a history of chronic diffuse osteoarthritic pain and takes oxycodone frequently apparently with  relief.  Neurologic does not complain of any dizziness headache or syncopal episodes or numbness.  Psych history of persistent insomnia does not complain of anxiety or depression.    Immunization History  Administered Date(s) Administered  . DTP 05/28/2001  . Influenza-Unspecified 05/26/2013, 05/04/2014, 04/29/2016  . Pneumococcal-Unspecified 07/28/2009, 05/06/2016  . Tdap 12/30/2012   Pertinent  Health Maintenance Due  Topic Date Due  . COLONOSCOPY  03/20/2017 (Originally 11/24/2006)  . INFLUENZA VACCINE  06/27/2017 (Originally 02/25/2017)   Fall Risk  12/30/2012  Falls in the past year? No   Functional Status Survey:    Vitals:   03/09/17 1624  BP: (!) 150/69  Pulse: 73  Resp: 20  Temp: (!) 97.4 F (36.3 C)  TempSrc: Oral  SpO2: 100%   Of note manual blood pressure was 140/70-weight is 216.8 Physical Exam In general this is a pleasant middle-aged male in no distress sitting comfortably in his wheelchair.  His skin is warm and dry-does have a history of left leg blisters appears to have a few of these on his mid left leg  Oropharynx is clear mucous membranes moist.  Chest he has shallow air entry with some scattered wheezing there is no labored breathing.  Heart is regular rate and rhythm somewhat distant heart sounds without murmur gallop or rub edema appears to be improved on the left lower extremity secondary to the hose   Abdomen somewhat obese soft nontender positive bowel sounds.  Muscle skeletal is status post right above-the-knee amputation-again left leg currently has hose applied edema appears to be somewhat improved strength appears intact upper extremities and left lower extremity.  Neurologic associated tachycardia speech is clear no lateralizing findings.  Psych he is alert and oriented pleasant and appropriate Labs reviewed:  Recent Labs  11/11/16 0730 12/16/16 0731 02/24/17 0727  NA 133* 137 135  K 4.0 3.8 4.2  CL 96* 93* 95*   CO2 28 32 31  GLUCOSE 79 91 108*  BUN 29* 29* 30*  CREATININE 1.60* 1.52* 1.43*  CALCIUM 9.0 9.6 8.9    Recent Labs  10/24/16 0212 02/24/17 0727  AST 18 14*  ALT 13* 13*  ALKPHOS 51 50  BILITOT 0.7 0.6  PROT 6.8 7.0  ALBUMIN 4.0 3.9    Recent Labs  11/11/16 0730 12/16/16 0731 02/24/17 0727  WBC 7.0 7.8 6.9  NEUTROABS 4.0 4.9 4.5  HGB 16.2 15.5 15.7  HCT 48.6 47.6 47.7  MCV 90.7 92.2 90.7  PLT 170 180 149*   Lab Results  Component Value Date   TSH 4.446 10/24/2016   Lab Results  Component Value Date   HGBA1C 5.6 10/24/2016   Lab Results  Component Value Date   CHOL 131 02/24/2017   HDL 30 (L) 02/24/2017   LDLCALC 53 02/24/2017   TRIG 241 (H) 02/24/2017   CHOLHDL 4.4 02/24/2017    Significant Diagnostic Results in last 30 days:  No results found.  Assessment/Plan  History of peripheral arterial disease status post right AKA-as well as status post left femoral endarterectomy- He does have close follow-up with vascular as noted above-he continues on aspirin as well as Plavix this appears to be relatively stable although certainly will have to be monitored.  #2-history of hypertension continues on metoprolol lisinopril Lasix and clonidine it appears he has somewhat variable systolic blood pressures ranging from the 130s to the 150s I do not see consistent elevations but this will have to be watched-it appears when he is outside and returns from smoking i and moving about in his wheelchair blood pressure tends to go up--we'll monitor for now apparently the elevations are not consistent.  #3 history coronary artery disease he continues again on aspirin Plavix and statin LDL was 67 on most recent lab.  #4-history of congestive heart failure he continues on Lasix echo in 2015 showed ejection fraction of 55% weight and edema appears relatively stable will monitor he does not appear to be overtly symptomatic.  #5 history of hyperlipidemia as noted above he's on a  statin LDL was 67 on most recent lab.  #6 history of COPD this is complicated with patient still smoking at this point will monitor he does continue on Incruse Ellipta  #7-history of carotid stenosis  he continues on aspirin and Imdur with associated coronary artery disease he does have a duplex ultrasound scheduled in November by vascular.  #8 history of osteoarthritis which chronic right shoulder pain and he is on oxycodone regularly as noted above apparently this is helping he does take this again with regularity-he does not report any sedation. It's with this nursing staff does not report this either.  #9 history chronic kidney disease this appears stable with a creatinine of 1.43 on lab done in late July 2018.  #10 history of leg blisters these, and go appears to have a small amount currently he does have dermatology follow-up scheduled.  Number 11- does have a history of GI bleed in the past BE NSAID related his hemoglobin has been stable for some time most recently 15.7 on lab done in late July 2018.  #12 history of partial parathyroidectomy-again workup did show clear margins he has declined follow-up.  #13 insomnia as noted above will continue the Xanax 1 mg daily at bedtime when necessary and add low-dose temazepam 7.5 mg daily at bedtime and monitor this was discussed with [name] he would prefer the increased dose of Xanax but hasn't into that and this was discussed with Dr. Lyndel Safe is well.  [id number] note greater than 40 minutes spent assessing patient-reviewing his chart-reviewing his labs-discussing concerns with the patient-and coordinating and formulating a plan of care for numerous diagnoses-of note greater than 50% of time spent coordinating plan of care

## 2017-03-11 ENCOUNTER — Other Ambulatory Visit: Payer: Self-pay

## 2017-03-11 MED ORDER — OXYCODONE HCL 15 MG PO TABS: ORAL_TABLET | ORAL | 0 refills | Status: DC

## 2017-03-11 NOTE — Telephone Encounter (Signed)
This encounter was created in error - please disregard.

## 2017-03-11 NOTE — Telephone Encounter (Signed)
RX Fax for [facility]@ [phone]  

## 2017-03-16 ENCOUNTER — Non-Acute Institutional Stay (SKILLED_NURSING_FACILITY): Payer: Medicare Other | Admitting: Internal Medicine

## 2017-03-16 DIAGNOSIS — G4700 Insomnia, unspecified: Secondary | ICD-10-CM | POA: Diagnosis not present

## 2017-03-16 DIAGNOSIS — R5383 Other fatigue: Secondary | ICD-10-CM | POA: Diagnosis not present

## 2017-03-16 DIAGNOSIS — I10 Essential (primary) hypertension: Secondary | ICD-10-CM

## 2017-03-16 NOTE — Progress Notes (Addendum)
This is an acute visit.  Level care skilled.  Facility is [facility].  Chief complaint-acute visit follow-up question lethargy-insomnia-hypertension  History of present illness.  Patient is a 61 year old male who is a long-term resident of the facility with a history of peripheral arterial disease status post right AKA-hypertension-coronary artery disease CHF hyperlipidemia COPD osteoarthritis chronic kidney disease-and insomnia.  Continues to be quite stable most recent complaints have centered around insomnia-we had made medication changes and appeared trazodone at 30 mg daily at bedtime when necessary was fairly effective however insurance would not pay for this anymore-this was discussed with patient as well as with Dr. Lyndel Safe and he was started on Xanax which he's been on before but at 1 mg when necessary he said previously he had been on 2 mg-we also continued Trazadone  at a lower dose of 7.5 mg daily at bedtime when necessary  over the weekend there is a nursing note stating that patient was somewhat lethargic 1 early morning-although this appears to be quite rare.  I did speak with patient he stated that he had been so sleepy and not sleeping well the last 2 nights previous  so that he slept quite well that night but he denies feeling sedated or lethargic said he was just sleeping well  Patient also is on oxycodone 15 mg every 4 hours as needed for arthritic pain shoulder pain he is quite insistent he needs this and has been on this long-term.  Currently he is at his baseline [place] about facility and goes outside to smoke on a regular basis he does have a history of COPD but continues to insist on smoking.  He does have a history of hypertension and occasionally a pressure systolically are somewhat elevated  in the 150s-I got 130/80 manually today-she is on numerous medications including clonidine 0.2 mg twice a day-lisinopril 40 mg a day and Lopressor 12.5 mg twice a day he is  also on Lasix daily-  Past Medical History:  Diagnosis Date  . Anemia, unspecified   . Arthritis    "shoulders" (09/22/2014)  . Asthma   . Atrial fibrillation ([place])   . Carotid artery occlusion   . CHF (congestive heart failure) ([place])   . COPD 11/27/2007   Qualifier: Diagnosis of  By: Garen Grams    . Coronary artery disease    Cardiac catheterization in 2008 showed 99% stenosis in proximal left circumflex which was treated with Taxus drug-eluting stent. There was mild RCA and LAD disease with normal ejection fraction.  Marland Kitchen ERECTILE DYSFUNCTION 11/27/2007   Qualifier: Diagnosis of  By: Garen Grams    . Gastric ulcer, unspecified as acute or chronic, without mention of hemorrhage, perforation, or obstruction   . Heart murmur   . HYPERLIPIDEMIA 11/27/2007   Qualifier: Diagnosis of  By: Garen Grams    . Hypertension   . HYPERTENSION, BENIGN 05/23/2009   Qualifier: Diagnosis of  By: Melvyn Novas MD, Christena Deem   . Other severe protein-calorie malnutrition   . PAD (peripheral artery disease) (HCC)    Previous right SFA stent in 2003. Directional atherectomy right SFA in 01/2013  . Pneumonia 2000  . Pressure ulcer, lower back(707.03)   . Tobacco use   . Traumatic amputation of leg(s) (complete) (partial), unilateral, at or above knee, without mention of complication         Past Surgical History:  Procedure Laterality Date  . ABDOMINAL AORTAGRAM N/A 02/23/2013   Procedure: ABDOMINAL Maxcine Ham;  Surgeon: Mertie Clause  Fletcher Anon, MD;  Location: [place] CATH LAB;  Service: Cardiovascular;  Laterality: N/A;  . ABDOMINAL AORTAGRAM N/A 09/22/2014   Procedure: ABDOMINAL Maxcine Ham;  Surgeon: Elam Dutch, MD;  Location: [facility] CATH LAB;  Service: Cardiovascular;  Laterality: N/A;  . ACNE CYST REMOVAL    . AMPUTATION Right 09/09/2013   Procedure: AMPUTATION ABOVE KNEE;  Surgeon: Rosetta Posner, MD;  Location: [place];  Service: Vascular;  Laterality: Right;  . ATHERECTOMY N/A  03/02/2013   Procedure: ATHERECTOMY;  Surgeon: Wellington Hampshire, MD;  Location: [facility] CATH LAB;  Service: Cardiovascular;  Laterality: N/A;  . CARDIAC CATHETERIZATION N/A 09/03/2015   Procedure: Left Heart Cath and Coronary Angiography;  Surgeon: Leonie Man, MD;  Location: [place] CV LAB;  Service: Cardiovascular;  Laterality: N/A;  . CARDIAC CATHETERIZATION N/A 09/03/2015   Procedure: Coronary Balloon Angioplasty;  Surgeon: Leonie Man, MD;  Location: [place] CV LAB;  Service: Cardiovascular;  Laterality: N/A;  . CORONARY ANGIOPLASTY  09/03/2015  . ENDARTERECTOMY Left 08/16/2013   Procedure: Exploration of Left Neck/Fix Bleeding;  Surgeon: Elam Dutch, MD;  Location: [place];  Service: Vascular;  Laterality: Left;  . ENDARTERECTOMY FEMORAL Left 01/30/2016   Procedure: ENDARTERECTOMY LEFT FEMORAL ARTERY;  Surgeon: Elam Dutch, MD;  Location: [facility] OR;  Service: Vascular;  Laterality: Left;  . ESOPHAGOGASTRODUODENOSCOPY N/A 09/08/2013   Procedure: ESOPHAGOGASTRODUODENOSCOPY (EGD);  Surgeon: Cleotis Nipper, MD;  Location: [facility] ENDOSCOPY;  Service: Endoscopy;  Laterality: N/A;  . ESOPHAGOGASTRODUODENOSCOPY (EGD) WITH PROPOFOL N/A 01/05/2014   Procedure: ESOPHAGOGASTRODUODENOSCOPY (EGD) WITH PROPOFOL;  Surgeon: Cleotis Nipper, MD;  Location: [facility] ENDOSCOPY;  Service: Endoscopy;  Laterality: N/A;  . FEMORAL ARTERY STENT Right 2003   [name] 09/22/2014  . FLEXIBLE SIGMOIDOSCOPY N/A 09/08/2013   Procedure: FLEXIBLE SIGMOIDOSCOPY;  Surgeon: Cleotis Nipper, MD;  Location: [facility] ENDOSCOPY;  Service: Endoscopy;  Laterality: N/A;  unprepp  . ILIAC ARTERY STENT Left 09/22/2014  . NASAL SINUS SURGERY  2004  . PAROTIDECTOMY Right 03/29/2014   Procedure: PAROTIDECTOMY;  Surgeon: Ascencion Dike, MD;  Location: [place];  Service: ENT;  Laterality: Right;  . PATCH ANGIOPLASTY Left 01/30/2016   Procedure: LEFT FEMORAL ARTYERY PATCH ANGIOPLASTY USING HEMASHIELD PLATINUM FINESSE PATCH;  Surgeon: Elam Dutch, MD;  Location: [place];  Service: Vascular;  Laterality: Left;  . PERIPHERAL VASCULAR CATHETERIZATION N/A 08/17/2015   Procedure: Abdominal Aortogram;  Surgeon: Elam Dutch, MD;  Location: [place] CV LAB;  Service: Cardiovascular;  Laterality: N/A;        Allergies  Allergen Reactions  . Codeine Nausea Only        Outpatient Encounter Prescriptions as of 03/09/2017  Medication Sig  . ALPRAZolam (XANAX) 1 MG tablet Take 1 mg by mouth at bedtime as needed for anxiety.  Marland Kitchen aspirin EC 81 MG tablet Take 1 tablet (81 mg total) by mouth daily.  . cloNIDine (CATAPRES) 0.2 MG tablet Take 0.2 mg by mouth 2 (two) times daily.  . clopidogrel (PLAVIX) 75 MG tablet Take 75 mg by mouth daily. @@ 9:00 pm  . colchicine 0.6 MG tablet Take 0.3 mg by mouth daily.   . furosemide (LASIX) 20 MG tablet Take 20 mg by mouth daily. Sunday, Tuesday, Thursday, Saturday  . furosemide (LASIX) 40 MG tablet Take 40 mg by mouth daily. Mon,Wed,Fri  . isosorbide mononitrate (IMDUR) 30 MG 24 hr tablet Take 1 tablet (30 mg total) by mouth daily.  Marland Kitchen lisinopril (PRINIVIL,ZESTRIL) 40 MG tablet Take  40 mg by mouth daily. Reported on 11/22/2015  . metoprolol tartrate (LOPRESSOR) 25 MG tablet Take 12.5 mg by mouth 2 (two) times daily.  Marland Kitchen oxyCODONE (ROXICODONE) 15 MG immediate release tablet Take one tablet by mouth every 4 hours as needed for pain HOLD for sedation, resp depression  . pantoprazole (PROTONIX) 40 MG tablet Take 40 mg by mouth daily.  . potassium chloride SA (K-DUR,KLOR-CON) 20 MEQ tablet Take 20 mEq by mouth daily.   . simvastatin (ZOCOR) 40 MG tablet Take 40 mg by mouth daily.  Marland Kitchen umeclidinium bromide (INCRUSE ELLIPTA) 62.5 MCG/INH AEPB Inhale 1 puff into the lungs daily.  . [DISCONTINUED] temazepam (RESTORIL) 30 MG capsule Take one capsule by mouth at bedtime for rest. Hold for sedation or respiratory depression   No facility-administered encounter medications on file as of 03/09/2017.       Review of Systems General is not complaining of any fever or chills  Skin does have a history of blisters on his left leg dermatology consult is pending these tend to come and go  Head ears eyes nose mouth and throat does not complaining of any visual changes or sore throat  Respiratory does have a somewhat chronic cough does not complain of any shortness of breath however. Continues to smoke  Cardiac does not complaining of any chest pain lower extremity edema actually appears to be improved he is wearing hose.  GI is not complaining of any nausea vomiting diarrhea constipation or abdominal discomfort.  Muscle skeletal does have a history of chronic diffuse osteoarthritic pain and takes oxycodone frequently apparently with relief. And has insisted that he needs this  Neurologic does not complain of any dizziness headache or syncopal episodes or numbness. Denies lethargy  Psych history of persistent insomnia does not complain of anxiety or depression.        Immunization History  Administered Date(s) Administered  . DTP 05/28/2001  . Influenza-Unspecified 05/26/2013, 05/04/2014, 04/29/2016  . Pneumococcal-Unspecified 07/28/2009, 05/06/2016  . Tdap 12/30/2012       Pertinent  Health Maintenance Due  Topic Date Due  . COLONOSCOPY  03/20/2017 (Originally 11/24/2006)  . INFLUENZA VACCINE  06/27/2017 (Originally 02/25/2017)   Fall Risk  12/30/2012  Falls in the past year? No   Functional Status Survey:     Vitals:    He is afebrile pulses 70 respirations of 17 and blood pressure 130/80 taken manually  Physical Exam In general this is a pleasant middle-aged male in no distress sitting comfortably in his wheelchair.  His skin is warm and dry-does have a history of left leg blisters appears to have a few of these on his mid left leg Relatively unchanged from previous exam  Oropharynx is clear mucous membranes moist.  Chest he has shallow air entry  with some scattered wheezing there is no labored breathing.  Heart is regular rate and rhythm somewhat distant heart sounds without murmur gallop or rub--edema appears relatively baseline he is wearing compression device  Abdomen somewhat obese soft nontender positive bowel sounds.  Muscle skeletal is status post right above-the-knee amputation-again left leg currently has hose applied edema appears to be stabilized-- strength appears intact upper extremities and left lower extremity.  Neurologic  Lateralizing findings her speech is clear cranial nerves intact right lower.  Psych he is alert and oriented pleasant and appropriate Labs reviewed:  RecentLabs(withinlast365days)   Recent Labs  11/11/16 0730 12/16/16 0731 02/24/17 0727  NA 133* 137 135  K 4.0 3.8 4.2  CL  96* 93* 95*  CO2 28 32 31  GLUCOSE 79 91 108*  BUN 29* 29* 30*  CREATININE 1.60* 1.52* 1.43*  CALCIUM 9.0 9.6 8.9      RecentLabs(withinlast365days)   Recent Labs  10/24/16 0212 02/24/17 0727  AST 18 14*  ALT 13* 13*  ALKPHOS 51 50  BILITOT 0.7 0.6  PROT 6.8 7.0  ALBUMIN 4.0 3.9      RecentLabs(withinlast365days)   Recent Labs  11/11/16 0730 12/16/16 0731 02/24/17 0727  WBC 7.0 7.8 6.9  NEUTROABS 4.0 4.9 4.5  HGB 16.2 15.5 15.7  HCT 48.6 47.6 47.7  MCV 90.7 92.2 90.7  PLT 170 180 149*      Assessment plan.  #1-lethargy-patient denies this as noted above he does not really have a history of lethargy-he does have a history of significant persistent insomnia complaints this was discussed with Dr. Lyndel Safe at this point will maintain the Xanax 1 mg as well as Trazadone 7.5 mg daily at bedtime both when necessary and monitor  #2 hypertension continues to have variable systolic readings that suspect some of this is related to blood pressure taken after smoking and ambulating in his wheelchair outside-at this point will monitor continue current medications including  clonidine and lisinopril and Lopressor.  #3 insomnia as noted above this is a challenging situation patient actually would like to Xanax increased but at this point will continue at 1 mg when necessary as well as low-dose trazodone-this was discussed with Dr. Lyndel Safe  [phone] note greater than 25 minutes spent assessing patient reviewing his chart reviewing his labs discussing his status with nursing as well as with Dr. Corrinne Eagle coordinating plan of care-of note greater than 50% of time spent coordinating plan of care with Dr. Steve Rattler input as well as discussion with patient    Addendum-patient is not on trazodone he is onTEMAZEPM  the dosage is correct but the medication should be temazepam and not trazodone he is currently on temazepam 7.5 mg daily at bedtime when necessary he is not on the trazodone

## 2017-03-25 ENCOUNTER — Non-Acute Institutional Stay (SKILLED_NURSING_FACILITY): Payer: Medicare Other | Admitting: Internal Medicine

## 2017-03-25 DIAGNOSIS — R5383 Other fatigue: Secondary | ICD-10-CM

## 2017-03-25 DIAGNOSIS — L821 Other seborrheic keratosis: Secondary | ICD-10-CM | POA: Diagnosis not present

## 2017-03-25 DIAGNOSIS — G4700 Insomnia, unspecified: Secondary | ICD-10-CM

## 2017-03-25 DIAGNOSIS — L280 Lichen simplex chronicus: Secondary | ICD-10-CM | POA: Diagnosis not present

## 2017-03-25 NOTE — Progress Notes (Signed)
This is an acute visit.  Level care skilled.  Facility is [facility].  Chief  complaint-acute visit follow-up lethargy-insomnia  History of present illness.  Patient is a pleasant 61 year old male long-term resident of the facility with a history of peripheral arterial disease status post right AKA hypertension coronary artery disease CHF hyperlipidemia COPD he also has osteoarthritis chronic kidney disease as well as persistent insomnia per his report.  He continues to complain at times of insomnia --L1 point he was on temazepam 30 mg daily at bedtime apparently this did help but insurance will no longer pay for this I spoke with the pharmacist today and apparently since he is now 89 there is concern about that dosage and so insurance will not reimburse.  I did discuss this with Dr. Lyndel Safe previously and he's been started on low-dose Xanax at 1 mg a day and low-dose temazepam 7.5 mg daily at bedtime.  This 14 day supply has run out of the temazepam and Xanax so he is asking for this to be reordered.  I did discuss this with the consulting pharmacist who is in the facility today and she is concerned about coexistent temazepam and Xanax.  I did discuss this with the patient at bedside and he is open to discontinuing the temazepam saying it never helped much anyway at that low dose at this point would like to stick with Xanax  he would like to Xanax increase up to 2 mg he expressed this desire previously as well as said he did well with this-but I did hesitate to do this and will address this with Dr. Lyndel Safe tomorrow.   He relates he still has difficulty sleeping and is up often at night-however I did speak with nursing as well who is concerned that occasionally he may have some lethargy although patient adamantly denies this  Past Medical History:  Diagnosis Date  . Anemia, unspecified   . Arthritis    "shoulders" (09/22/2014)  . Asthma   . Atrial fibrillation ([place])   .  Carotid artery occlusion   . CHF (congestive heart failure) ([place])   . COPD 11/27/2007   Qualifier: Diagnosis of By: Garen Grams   . Coronary artery disease    Cardiac catheterization in 2008 showed 99% stenosis in proximal left circumflex which was treated with Taxus drug-eluting stent. There was mild RCA and LAD disease with normal ejection fraction.  Marland Kitchen ERECTILE DYSFUNCTION 11/27/2007   Qualifier: Diagnosis of By: Garen Grams   . Gastric ulcer, unspecified as acute or chronic, without mention of hemorrhage, perforation, or obstruction   . Heart murmur   . HYPERLIPIDEMIA 11/27/2007   Qualifier: Diagnosis of By: Garen Grams   . Hypertension   . HYPERTENSION, BENIGN 05/23/2009   Qualifier: Diagnosis of By: Melvyn Novas MD, Christena Deem   . Other severe protein-calorie malnutrition   . PAD (peripheral artery disease) (HCC)    Previous right SFA stent in 2003. Directional atherectomy right SFA in 01/2013  . Pneumonia 2000  . Pressure ulcer, lower back(707.03)   . Tobacco use   . Traumatic amputation of leg(s) (complete) (partial), unilateral, at or above knee, without mention of complication         Past Surgical History:  Procedure Laterality Date  . ABDOMINAL AORTAGRAM N/A 02/23/2013   Procedure: ABDOMINAL Maxcine Ham; Surgeon: Wellington Hampshire, MD; Location: [place] CATH LAB; Service: Cardiovascular; Laterality: N/A;  . ABDOMINAL AORTAGRAM N/A 09/22/2014   Procedure: ABDOMINAL Maxcine Ham; Surgeon: Elam Dutch, MD; Location: [facility]  CATH LAB; Service: Cardiovascular; Laterality: N/A;  . ACNE CYST REMOVAL    . AMPUTATION Right 09/09/2013   Procedure: AMPUTATION ABOVE KNEE; Surgeon: Rosetta Posner, MD; Location: [place]; Service: Vascular; Laterality: Right;  . ATHERECTOMY N/A 03/02/2013   Procedure: ATHERECTOMY; Surgeon: Wellington Hampshire, MD; Location: [facility] CATH LAB; Service: Cardiovascular; Laterality: N/A;  . CARDIAC CATHETERIZATION N/A 09/03/2015    Procedure: Left Heart Cath and Coronary Angiography; Surgeon: Leonie Man, MD; Location: [place] CV LAB; Service: Cardiovascular; Laterality: N/A;  . CARDIAC CATHETERIZATION N/A 09/03/2015   Procedure: Coronary Balloon Angioplasty; Surgeon: Leonie Man, MD; Location: [place] CV LAB; Service: Cardiovascular; Laterality: N/A;  . CORONARY ANGIOPLASTY  09/03/2015  . ENDARTERECTOMY Left 08/16/2013   Procedure: Exploration of Left Neck/Fix Bleeding; Surgeon: Elam Dutch, MD; Location: [place]; Service: Vascular; Laterality: Left;  . ENDARTERECTOMY FEMORAL Left 01/30/2016   Procedure: ENDARTERECTOMY LEFT FEMORAL ARTERY; Surgeon: Elam Dutch, MD; Location: [facility] OR; Service: Vascular; Laterality: Left;  . ESOPHAGOGASTRODUODENOSCOPY N/A 09/08/2013   Procedure: ESOPHAGOGASTRODUODENOSCOPY (EGD); Surgeon: Cleotis Nipper, MD; Location: [facility] ENDOSCOPY; Service: Endoscopy; Laterality: N/A;  . ESOPHAGOGASTRODUODENOSCOPY (EGD) WITH PROPOFOL N/A 01/05/2014   Procedure: ESOPHAGOGASTRODUODENOSCOPY (EGD) WITH PROPOFOL; Surgeon: Cleotis Nipper, MD; Location: [facility] ENDOSCOPY; Service: Endoscopy; Laterality: N/A;  . FEMORAL ARTERY STENT Right 2003   [name] 09/22/2014  . FLEXIBLE SIGMOIDOSCOPY N/A 09/08/2013   Procedure: FLEXIBLE SIGMOIDOSCOPY; Surgeon: Cleotis Nipper, MD; Location: [facility] ENDOSCOPY; Service: Endoscopy; Laterality: N/A; unprepp  . ILIAC ARTERY STENT Left 09/22/2014  . NASAL SINUS SURGERY  2004  . PAROTIDECTOMY Right 03/29/2014   Procedure: PAROTIDECTOMY; Surgeon: Ascencion Dike, MD; Location: [place]; Service: ENT; Laterality: Right;  . PATCH ANGIOPLASTY Left 01/30/2016   Procedure: LEFT FEMORAL ARTYERY PATCH ANGIOPLASTY USING HEMASHIELD PLATINUM FINESSE PATCH; Surgeon: Elam Dutch, MD; Location: [place]; Service: Vascular; Laterality: Left;  . PERIPHERAL VASCULAR CATHETERIZATION N/A 08/17/2015   Procedure: Abdominal Aortogram; Surgeon: Elam Dutch, MD; Location: [place] CV LAB; Service: Cardiovascular; Laterality: N/A;        Allergies  Allergen Reactions  . Codeine Nausea Only        Outpatient Encounter Prescriptions as of 03/09/2017  Medication Sig  . ALPRAZolam (XANAX) 1 MG tablet Take 1 mg by mouth at bedtime as needed for anxiety.  Marland Kitchen aspirin EC 81 MG tablet Take 1 tablet (81 mg total) by mouth daily.  . cloNIDine (CATAPRES) 0.2 MG tablet Take 0.2 mg by mouth 2 (two) times daily.  . clopidogrel (PLAVIX) 75 MG tablet Take 75 mg by mouth daily. @@ 9:00 pm  . colchicine 0.6 MG tablet Take 0.3 mg by mouth daily.   . furosemide (LASIX) 20 MG tablet Take 20 mg by mouth daily. Sunday, Tuesday, Thursday, Saturday  . furosemide (LASIX) 40 MG tablet Take 40 mg by mouth daily. Mon,Wed,Fri  . isosorbide mononitrate (IMDUR) 30 MG 24 hr tablet Take 1 tablet (30 mg total) by mouth daily.  Marland Kitchen lisinopril (PRINIVIL,ZESTRIL) 40 MG tablet Take 40 mg by mouth daily. Reported on 11/22/2015  . metoprolol tartrate (LOPRESSOR) 25 MG tablet Take 12.5 mg by mouth 2 (two) times daily.  Marland Kitchen oxyCODONE (ROXICODONE) 15 MG immediate release tablet Take one tablet by mouth every 4 hours as needed for pain HOLD for sedation, resp depression  . pantoprazole (PROTONIX) 40 MG tablet Take 40 mg by mouth daily.  . potassium chloride SA (K-DUR,KLOR-CON) 20 MEQ tablet Take 20 mEq by mouth daily.   . simvastatin (ZOCOR)  40 MG tablet Take 40 mg by mouth daily.  Marland Kitchen umeclidinium bromide (INCRUSE ELLIPTA) 62.5 MCG/INH AEPB Inhale 1 puff into the lungs daily.  . [DISCONTINUED] temazepam (RESTORIL) 30 MG capsule Take one capsule by mouth at bedtime for rest. Hold for sedation or respiratory depression   No facility-administered encounter medications on file as of 03/09/2017.      Review of systems.  In general he is denying any fever or chills.  Skin denies any diaphoresis does have a rash blisters lower left leg he actually seen dermatology for  this and they have biopsied the area.  Head ears eyes nose mouth and throat does not complain of any visual changes or sore throat.  Respiratory denies shortness of breath or cough beyond baseline he does have COPD changes but continues to smoke despite encouragement to quit.  Cardiac does not complaining any chest pain edema lower left extremity appears to be baseline.  Muscle skeletal has a history of chronic pain he is on oxycodone every 4 hours and quite insistent  he needs this  Neurologic does not complaining of numbness tingling syncope--denies feeling lethargic   Physical exam.  He is afebrile pulse of 72 respirations of 19. Blood pressure 130/70   In general this is a pleasant middle-aged male in no distress sitting comfortably in his wheelchair.  His skin is warm and dry continues to have some blistering of his left lower extremity he has had a biopsy of this this appears relatively unchanged.  Chest he has shallow air entry with some scattered bronchial sounds and wheezing this does not appear changed from baseline there is no labored breathing.  Heart is distant heart sounds regular rate and rhythm I could not really appreciate murmur gallop or rub again sounds were distant edema appears relatively baseline left lower extremity he has compression device on.  Neurologic is grossly intact his speech is clear no lateralizing findings he is bright and alert.  Musculoskeletal is status post right above-the-knee amputation-moves all extremities at baseline strength-ambulates very well in wheelchair as a compression device on his left lower extremity  Psych he is alert and oriented 3 pleasant and appropriate  Labs.  02/24/2017.  Sodium 135 potassium 4.2 BUN 30 creatinine 1.43.  Liver function tests essentially within normal limits.  WBC 6.9 hemoglobin 15.7 platelets 149,000.   Assessment and plan.  Lethargy-insomnia-patient vehemently denies he is lethargic says  sometimes he does sleep well but denies any over sedation-I did speak with the consulting pharmacist  who was concerned about receiving the temazepam as well as Xanax I also discussed this with [name] at bedside he is open to discontinuing the temazepam which we will do at this point will continue the Xanax 1 mg daily at bedtime when necessary he would like it increased up to 2 mg but will hold back on this for now pending Dr. Steve Rattler input and Irene did express understanding of that.  He also continues on oxycodone 15 mg every 4 hours for generalized arthritic pain more so of his shoulder-he is insistent he needs this as well.  Will monitor but will discontinue  the temazepam 7.5 daily at bedtime when necessary.  CPT 99309--of note greater than 25 minutes spent assessing patient reviewing his chart review and his labs discussing his medications with the clinical consulting pharmacist-and coordinating a plan of care of note greater than 50% of time spent coordinating plan of care including consultation with consulting pharmacist

## 2017-04-06 ENCOUNTER — Other Ambulatory Visit (HOSPITAL_COMMUNITY)
Admission: AD | Admit: 2017-04-06 | Discharge: 2017-04-06 | Disposition: A | Discharge status: Home / Self Care | Payer: Medicare Other | Source: Skilled Nursing Facility | Attending: Internal Medicine | Admitting: Internal Medicine

## 2017-04-06 ENCOUNTER — Encounter: Payer: Self-pay | Admitting: Internal Medicine

## 2017-04-06 ENCOUNTER — Non-Acute Institutional Stay (SKILLED_NURSING_FACILITY): Payer: Medicare Other

## 2017-04-06 ENCOUNTER — Non-Acute Institutional Stay (SKILLED_NURSING_FACILITY): Payer: Medicare Other | Admitting: Internal Medicine

## 2017-04-06 DIAGNOSIS — G4700 Insomnia, unspecified: Secondary | ICD-10-CM | POA: Diagnosis not present

## 2017-04-06 DIAGNOSIS — N390 Urinary tract infection, site not specified: Secondary | ICD-10-CM | POA: Diagnosis not present

## 2017-04-06 DIAGNOSIS — R5383 Other fatigue: Secondary | ICD-10-CM | POA: Diagnosis not present

## 2017-04-06 DIAGNOSIS — Z0000 Encounter for general adult medical examination without abnormal findings: Secondary | ICD-10-CM | POA: Diagnosis not present

## 2017-04-06 LAB — URINALYSIS, ROUTINE W REFLEX MICROSCOPIC
Bilirubin Urine: NEGATIVE
Glucose, UA: NEGATIVE mg/dL
Hgb urine dipstick: NEGATIVE
Ketones, ur: NEGATIVE mg/dL
Leukocytes, UA: NEGATIVE
NITRITE: NEGATIVE
PH: 6 (ref 5.0–8.0)
Protein, ur: NEGATIVE mg/dL
Specific Gravity, Urine: 1.008 (ref 1.005–1.030)

## 2017-04-06 NOTE — Patient Instructions (Signed)
Edwin [name] , Thank you for taking time to come for your Medicare Wellness Visit. I appreciate your ongoing commitment to your health goals. Please review the following plan we discussed and let me know if I can assist you in the future.   Screening recommendations/referrals: Colonoscopy excluded, pt is long term Recommended yearly ophthalmology/optometry visit for glaucoma screening and checkup Recommended yearly dental visit for hygiene and checkup  Vaccinations: Influenza vaccine due Pneumococcal vaccine up to date Tdap vaccine up to date. Due 12/31/22 Shingles vaccine not in records  Advanced directives: Need a copy for chart   Conditions/risks identified: None  Next appointment: Dr. Lyndel Safe makes rounds  Preventive Care 40-64 Years, Male Preventive care refers to lifestyle choices and visits with your health care provider that can promote health and wellness. What does preventive care include?  A yearly physical exam. This is also called an annual well check.  Dental exams once or twice a year.  Routine eye exams. Ask your health care provider how often you should have your eyes checked.  Personal lifestyle choices, including:  Daily care of your teeth and gums.  Regular physical activity.  Eating a healthy diet.  Avoiding tobacco and drug use.  Limiting alcohol use.  Practicing safe sex.  Taking low-dose aspirin every day starting at age 75. What happens during an annual well check? The services and screenings done by your health care provider during your annual well check will depend on your age, overall health, lifestyle risk factors, and family history of disease. Counseling  Your health care provider may ask you questions about your:  Alcohol use.  Tobacco use.  Drug use.  Emotional well-being.  Home and relationship well-being.  Sexual activity.  Eating habits.  Work and work Statistician. Screening  You may have the following tests or  measurements:  Height, weight, and BMI.  Blood pressure.  Lipid and cholesterol levels. These may be checked every 5 years, or more frequently if you are over 52 years old.  Skin check.  Lung cancer screening. You may have this screening every year starting at age 68 if you have a 30-pack-year history of smoking and currently smoke or have quit within the past 15 years.  Fecal occult blood test (FOBT) of the stool. You may have this test every year starting at age 41.  Flexible sigmoidoscopy or colonoscopy. You may have a sigmoidoscopy every 5 years or a colonoscopy every 10 years starting at age 62.  Prostate cancer screening. Recommendations will vary depending on your family history and other risks.  Hepatitis C blood test.  Hepatitis B blood test.  Sexually transmitted disease (STD) testing.  Diabetes screening. This is done by checking your blood sugar (glucose) after you have not eaten for a while (fasting). You may have this done every 1-3 years. Discuss your test results, treatment options, and if necessary, the need for more tests with your health care provider. Vaccines  Your health care provider may recommend certain vaccines, such as:  Influenza vaccine. This is recommended every year.  Tetanus, diphtheria, and acellular pertussis (Tdap, Td) vaccine. You may need a Td booster every 10 years.  Zoster vaccine. You may need this after age 17.  Pneumococcal 13-valent conjugate (PCV13) vaccine. You may need this if you have certain conditions and have not been vaccinated.  Pneumococcal polysaccharide (PPSV23) vaccine. You may need one or two doses if you smoke cigarettes or if you have certain conditions. Talk to your health care provider about  which screenings and vaccines you need and how often you need them. This information is not intended to replace advice given to you by your health care provider. Make sure you discuss any questions you have with your health care  provider. Document Released: 08/10/2015 Document Revised: 04/02/2016 Document Reviewed: 05/15/2015 Elsevier Interactive Patient Education  2017 [place] Prevention in the Home Falls can cause injuries. They can happen to people of all ages. There are many things you can do to make your home safe and to help prevent falls. What can I do on the outside of my home?  Regularly fix the edges of walkways and driveways and fix any cracks.  Remove anything that might make you trip as you walk through a door, such as a raised step or threshold.  Trim any bushes or trees on the path to your home.  Use bright outdoor lighting.  Clear any walking paths of anything that might make someone trip, such as rocks or tools.  Regularly check to see if handrails are loose or broken. Make sure that both sides of any steps have handrails.  Any raised decks and porches should have guardrails on the edges.  Have any leaves, snow, or ice cleared regularly.  Use sand or salt on walking paths during winter.  Clean up any spills in your garage right away. This includes oil or grease spills. What can I do in the bathroom?  Use night lights.  Install grab bars by the toilet and in the tub and shower. Do not use towel bars as grab bars.  Use non-skid mats or decals in the tub or shower.  If you need to sit down in the shower, use a plastic, non-slip stool.  Keep the floor dry. Clean up any water that spills on the floor as soon as it happens.  Remove soap buildup in the tub or shower regularly.  Attach bath mats securely with double-sided non-slip rug tape.  Do not have throw rugs and other things on the floor that can make you trip. What can I do in the bedroom?  Use night lights.  Make sure that you have a light by your bed that is easy to reach.  Do not use any sheets or blankets that are too big for your bed. They should not hang down onto the floor.  Have a firm chair that has  side arms. You can use this for support while you get dressed.  Do not have throw rugs and other things on the floor that can make you trip. What can I do in the kitchen?  Clean up any spills right away.  Avoid walking on wet floors.  Keep items that you use a lot in easy-to-reach places.  If you need to reach something above you, use a strong step stool that has a grab bar.  Keep electrical cords out of the way.  Do not use floor polish or wax that makes floors slippery. If you must use wax, use non-skid floor wax.  Do not have throw rugs and other things on the floor that can make you trip. What can I do with my stairs?  Do not leave any items on the stairs.  Make sure that there are handrails on both sides of the stairs and use them. Fix handrails that are broken or loose. Make sure that handrails are as long as the stairways.  Check any carpeting to make sure that it is firmly attached to the  stairs. Fix any carpet that is loose or worn.  Avoid having throw rugs at the top or bottom of the stairs. If you do have throw rugs, attach them to the floor with carpet tape.  Make sure that you have a light switch at the top of the stairs and the bottom of the stairs. If you do not have them, ask someone to add them for you. What else can I do to help prevent falls?  Wear shoes that:  Do not have high heels.  Have rubber bottoms.  Are comfortable and fit you well.  Are closed at the toe. Do not wear sandals.  If you use a stepladder:  Make sure that it is fully opened. Do not climb a closed stepladder.  Make sure that both sides of the stepladder are locked into place.  Ask someone to hold it for you, if possible.  Clearly mark and make sure that you can see:  Any grab bars or handrails.  First and last steps.  Where the edge of each step is.  Use tools that help you move around (mobility aids) if they are needed. These  include:  Canes.  Walkers.  Scooters.  Crutches.  Turn on the lights when you go into a dark area. Replace any light bulbs as soon as they burn out.  Set up your furniture so you have a clear path. Avoid moving your furniture around.  If any of your floors are uneven, fix them.  If there are any pets around you, be aware of where they are.  Review your medicines with your doctor. Some medicines can make you feel dizzy. This can increase your chance of falling. Ask your doctor what other things that you can do to help prevent falls. This information is not intended to replace advice given to you by your health care provider. Make sure you discuss any questions you have with your health care provider. Document Released: 05/10/2009 Document Revised: 12/20/2015 Document Reviewed: 08/18/2014 Elsevier Interactive Patient Education  2017 [employer].

## 2017-04-06 NOTE — Progress Notes (Signed)
This encounter was created in error - please disregard.

## 2017-04-06 NOTE — Progress Notes (Signed)
This is an acute visit.  Level care skilled.  Facility is [facility].  Chief complaint-acute visit follow-up possible lethargy.  History of present illness.  Patient is a 61 year old male with long-term resident of the facility has a history of peripheral arterial disease with a right AKA-hypertension-coronary artery disease-COPD-continues to smoke-CHF-osteoarthritis-chronic kidney disease.  He does have a history of complaints of insomnia at one point was on temazepam 30 mg daily at bedtime but insurance would not reimburse at that dose.  So this was reduced down to 7.5 mg as well as Xanax 1 mg when necessary at night.  Temazepam has been discontinued secondary patient says  it does not really help any-at that point wished stay with the Xanax 1 mg.  There were concerns by nursing staff of lethargy and these concerns persist per nursing at times it will find him sleeping in his wheelchair or at the computer terminal in the day room.  However he says he is not overly sleepy and actually does not sleep [id number]-he is quite adamant about this-actually would like Xanax increased.  I did discuss this with Dr. Lyndel Safe is well  with recommendation to try to decrease his Xanax  down to 0.5 mg a day.  Patient says he is agreeable to do this although he is not very happy about  It--.  Patient does have a history of osteoarthritis chronic pain he is on oxycodone as well 15 mg every 4 hours routine and has been quite adamant that he needs this   Past Medical History:  Diagnosis Date  . Anemia, unspecified   . Arthritis    "shoulders" (09/22/2014)  . Asthma   . Atrial fibrillation ([place])   . Carotid artery occlusion   . CHF (congestive heart failure) ([place])   . COPD 11/27/2007   Qualifier: Diagnosis of By: Garen Grams   . Coronary artery disease    Cardiac catheterization in 2008 showed 99% stenosis in proximal left circumflex which was treated with Taxus drug-eluting  stent. There was mild RCA and LAD disease with normal ejection fraction.  Marland Kitchen ERECTILE DYSFUNCTION 11/27/2007   Qualifier: Diagnosis of By: Garen Grams   . Gastric ulcer, unspecified as acute or chronic, without mention of hemorrhage, perforation, or obstruction   . Heart murmur   . HYPERLIPIDEMIA 11/27/2007   Qualifier: Diagnosis of By: Garen Grams   . Hypertension   . HYPERTENSION, BENIGN 05/23/2009   Qualifier: Diagnosis of By: Melvyn Novas MD, Christena Deem   . Other severe protein-calorie malnutrition   . PAD (peripheral artery disease) (H[facility])    Previous right SFA stent in 2003. Directional atherectomy right SFA in 01/2013  . Pneumonia 2000  . Pressure ulcer, lower back(707.03)   . Tobacco use   . Traumatic amputation of leg(s) (complete) (partial), unilateral, at or above knee, without mention of complication         Past Surgical History:  Procedure Laterality Date  . ABDOMINAL AORTAGRAM N/A 02/23/2013   Procedure: ABDOMINAL Maxcine Ham; Surgeon: Wellington Hampshire, MD; Location: [facility] CATH LAB; Service: Cardiovascular; Laterality: N/A;  . ABDOMINAL AORTAGRAM N/A 09/22/2014   Procedure: ABDOMINAL Maxcine Ham; Surgeon: Elam Dutch, MD; Location: [facility] CATH LAB; Service: Cardiovascular; Laterality: N/A;  . ACNE CYST REMOVAL    . AMPUTATION Right 09/09/2013   Procedure: AMPUTATION ABOVE KNEE; Surgeon: Rosetta Posner, MD; Location: [place]; Service: Vascular; Laterality: Right;  . ATHERECTOMY N/A 03/02/2013   Procedure: ATHERECTOMY; Surgeon: Wellington Hampshire, MD; Location: [facility] CATH LAB; Service:  Cardiovascular; Laterality: N/A;  . CARDIAC CATHETERIZATION N/A 09/03/2015   Procedure: Left Heart Cath and Coronary Angiography; Surgeon: Leonie Man, MD; Location: [place] CV LAB; Service: Cardiovascular; Laterality: N/A;  . CARDIAC CATHETERIZATION N/A 09/03/2015   Procedure: Coronary Balloon Angioplasty; Surgeon: Leonie Man, MD; Location: [place]  CV LAB; Service: Cardiovascular; Laterality: N/A;  . CORONARY ANGIOPLASTY  09/03/2015  . ENDARTERECTOMY Left 08/16/2013   Procedure: Exploration of Left Neck/Fix Bleeding; Surgeon: Elam Dutch, MD; Location: [place]; Service: Vascular; Laterality: Left;  . ENDARTERECTOMY FEMORAL Left 01/30/2016   Procedure: ENDARTERECTOMY LEFT FEMORAL ARTERY; Surgeon: Elam Dutch, MD; Location: [facility] OR; Service: Vascular; Laterality: Left;  . ESOPHAGOGASTRODUODENOSCOPY N/A 09/08/2013   Procedure: ESOPHAGOGASTRODUODENOSCOPY (EGD); Surgeon: Cleotis Nipper, MD; Location: [facility] ENDOSCOPY; Service: Endoscopy; Laterality: N/A;  . ESOPHAGOGASTRODUODENOSCOPY (EGD) WITH PROPOFOL N/A 01/05/2014   Procedure: ESOPHAGOGASTRODUODENOSCOPY (EGD) WITH PROPOFOL; Surgeon: Cleotis Nipper, MD; Location: [facility] ENDOSCOPY; Service: Endoscopy; Laterality: N/A;  . FEMORAL ARTERY STENT Right 2003   [name] 09/22/2014  . FLEXIBLE SIGMOIDOSCOPY N/A 09/08/2013   Procedure: FLEXIBLE SIGMOIDOSCOPY; Surgeon: Cleotis Nipper, MD; Location: [facility] ENDOSCOPY; Service: Endoscopy; Laterality: N/A; unprepp  . ILIAC ARTERY STENT Left 09/22/2014  . NASAL SINUS SURGERY  2004  . PAROTIDECTOMY Right 03/29/2014   Procedure: PAROTIDECTOMY; Surgeon: Ascencion Dike, MD; Location: [place]; Service: ENT; Laterality: Right;  . PATCH ANGIOPLASTY Left 01/30/2016   Procedure: LEFT FEMORAL ARTYERY PATCH ANGIOPLASTY USING HEMASHIELD PLATINUM FINESSE PATCH; Surgeon: Elam Dutch, MD; Location: [place]; Service: Vascular; Laterality: Left;  . PERIPHERAL VASCULAR CATHETERIZATION N/A 08/17/2015   Procedure: Abdominal Aortogram; Surgeon: Elam Dutch, MD; Location: [place] CV LAB; Service: Cardiovascular; Laterality: N/A;        Allergies  Allergen Reactions  . Codeine Nausea Only           Medication Sig  . ALPRAZolam (XANAX) 1 MG tablet Take 1 mg by mouth at bedtime as needed for anxiety.  Marland Kitchen aspirin EC 81 MG  tablet Take 1 tablet (81 mg total) by mouth daily.  . cloNIDine (CATAPRES) 0.2 MG tablet Take 0.2 mg by mouth 2 (two) times daily.  . clopidogrel (PLAVIX) 75 MG tablet Take 75 mg by mouth daily. @@ 9:00 pm  . colchicine 0.6 MG tablet Take 0.3 mg by mouth daily.   . furosemide (LASIX) 20 MG tablet Take 20 mg by mouth daily. Sunday, Tuesday, Thursday, Saturday  . furosemide (LASIX) 40 MG tablet Take 40 mg by mouth daily. Mon,Wed,Fri  . isosorbide mononitrate (IMDUR) 30 MG 24 hr tablet Take 1 tablet (30 mg total) by mouth daily.  Marland Kitchen lisinopril (PRINIVIL,ZESTRIL) 40 MG tablet Take 40 mg by mouth daily. Reported on 11/22/2015  . metoprolol tartrate (LOPRESSOR) 25 MG tablet Take 12.5 mg by mouth 2 (two) times daily.  Marland Kitchen oxyCODONE (ROXICODONE) 15 MG immediate release tablet Take one tablet by mouth every 4 hours as needed for pain HOLD for sedation, resp depression  . pantoprazole (PROTONIX) 40 MG tablet Take 40 mg by mouth daily.  . potassium chloride SA (K-DUR,KLOR-CON) 20 MEQ tablet Take 20 mEq by mouth daily.   . simvastatin (ZOCOR) 40 MG tablet Take 40 mg by mouth daily.  Marland Kitchen umeclidinium bromide (INCRUSE ELLIPTA) 62.5 MCG/INH AEPB Inhale 1 puff into the lungs daily.  .           Review of systems.  In general is not complaining any fever or chills.  Skin no rashes or itching blisters left lower  leg appear to be improving he has seen dermatology.  Head ears eyes nose mouth and throat not complaining of any visual changes or sore throat.  Respiratory history COPD continues to smoke but does not complaining of any shortness of breath.  Cardiac does not complaining any chest pain or palpitations as some left lower extremity edema which is well-controlled with his compression device.  GI is not complaining of any nausea vomiting diarrhea constipation or abdominal discomfort.  GU does not complain of dysuria.  Musculoskeletal has complaints of diffuse joint pain most prominently of his  shoulders with chronic osteoarthritis.  Neurologic is not complaining of dizziness headache or numbness at this time or syncope.  Psych does not complain of overt anxiety or depression says he has persistent insomnia-but denies sleeping too much" says he doesn't sleep enough   Physical exam.  Temperature is 98.0 pulse 60 respirations 20 blood pressure 123/68 weight is 223 pounds.  In general this is a well-nourished borderline obese middle-aged male in no distress.  His skin is warm and dry blistering of his left lower extremity appears to be improving somewhat this is more so eyes up her leg.  Chest continues with shallow air entry scattered bronchial sounds and wheezing which is not new this is baseline there is no labored breathing heart is regular rate and rhythm without murmur gallop or rub edema appears to be well-controlled left lower extremity with the compression device.  Abdomen is soft somewhat obese nontender with positive bowel sounds.  Muscle skeletal is status post right above-the-knee amputation-moves other extremities at baseline ambulates quite well in his wheelchair upper extremity strength is well-preserved.  Neurologic is grossly intact no lateralizing findings speech is clear.  Psych he is alert and oriented pleasant and appropriate somewhat agitated however saying he would actually like his Xanax increased  Labs.  02/24/2017.  Sodium 135 potassium 4.2 BUN 30 creatinine 1.43.  WBC 6.9 hemoglobin 15.7 platelets 149.  Assessment plan.  #1-somnolence? This is complicated with patient's persistent complaint that he has insomnia-challenging situation-however nursing staff is quite concerning with  him sleeping at times in his wheelchair-apparently he does have somewhat unstructured sleep pattern which I suspect is contributing to this.  Again I did discuss this with him fairly extensively at bedside-and he is agreeable to reducing his Xanax down to 0.5 mg a day  although actually he would prefer to be increased.--I did discuss the possibility of melatonin but he says that did not help  Also per discussion with Dr. Lyndel Safe will obtain a urinalysis and culture as well as TSH as well as CBC and comprehensive metabolic panel to make sure there is not a metabolic or infectious etiology contributing to possible somnolence.  Clinically he appears stable and at his baseline this morning.  [id number]

## 2017-04-06 NOTE — Progress Notes (Signed)
Subjective:   Edwin [name] is a 61 y.o. male who presents for an Initial Medicare Annual Wellness Visit at [facility]   Objective:    Today's Vitals   04/06/17 1218  BP: 130/80  Pulse: (!) 59  Temp: 98 F (36.7 C)  TempSrc: Oral  SpO2: 92%  Weight: 220 lb (99.8 kg)  Height: 5\' 8"  (1.727 m)   Body mass index is 33.45 kg/m.  Current Medications (verified) Outpatient Encounter Prescriptions as of 04/06/2017  Medication Sig  . ALPRAZolam (XANAX) 1 MG tablet Take 1 mg by mouth at bedtime as needed for anxiety.  Marland Kitchen aspirin EC 81 MG tablet Take 1 tablet (81 mg total) by mouth daily.  . cloNIDine (CATAPRES) 0.2 MG tablet Take 0.2 mg by mouth 2 (two) times daily.  . clopidogrel (PLAVIX) 75 MG tablet Take 75 mg by mouth daily. @@ 9:00 pm  . colchicine 0.6 MG tablet Take 0.3 mg by mouth daily.   . furosemide (LASIX) 20 MG tablet Take 20 mg by mouth daily. Sunday, Tuesday, Thursday, Saturday  . furosemide (LASIX) 40 MG tablet Take 40 mg by mouth daily. Mon,Wed,Fri  . isosorbide mononitrate (IMDUR) 30 MG 24 hr tablet Take 1 tablet (30 mg total) by mouth daily.  Marland Kitchen lisinopril (PRINIVIL,ZESTRIL) 40 MG tablet Take 40 mg by mouth daily. Reported on 11/22/2015  . metoprolol tartrate (LOPRESSOR) 25 MG tablet Take 12.5 mg by mouth 2 (two) times daily.  Marland Kitchen oxyCODONE (ROXICODONE) 15 MG immediate release tablet Take one tablet by mouth every 4 hours as needed for pain HOLD for sedation, resp depression (Patient taking differently: 1 mg. Take one tablet by mouth every 4 hours HOLD for sedation, resp depression)  . pantoprazole (PROTONIX) 40 MG tablet Take 40 mg by mouth daily.  . potassium chloride SA (K-DUR,KLOR-CON) 20 MEQ tablet Take 20 mEq by mouth daily.   . simvastatin (ZOCOR) 40 MG tablet Take 40 mg by mouth daily.  Marland Kitchen umeclidinium bromide (INCRUSE ELLIPTA) 62.5 MCG/INH AEPB Inhale 1 puff into the lungs daily.   No facility-administered encounter medications on file as  of 04/06/2017.     Allergies (verified) Codeine   History: Past Medical History:  Diagnosis Date  . Anemia, unspecified   . Arthritis    "shoulders" (09/22/2014)  . Asthma   . Atrial fibrillation ([place])   . Carotid artery occlusion   . CHF (congestive heart failure) ([place])   . COPD 11/27/2007   Qualifier: Diagnosis of  By: Garen Grams    . Coronary artery disease    Cardiac catheterization in 2008 showed 99% stenosis in proximal left circumflex which was treated with Taxus drug-eluting stent. There was mild RCA and LAD disease with normal ejection fraction.  Marland Kitchen ERECTILE DYSFUNCTION 11/27/2007   Qualifier: Diagnosis of  By: Garen Grams    . Gastric ulcer, unspecified as acute or chronic, without mention of hemorrhage, perforation, or obstruction   . Heart murmur   . HYPERLIPIDEMIA 11/27/2007   Qualifier: Diagnosis of  By: Garen Grams    . Hypertension   . HYPERTENSION, BENIGN 05/23/2009   Qualifier: Diagnosis of  By: Melvyn Novas MD, Christena Deem   . Other severe protein-calorie malnutrition   . PAD (peripheral artery disease) (HCC)    Previous right SFA stent in 2003. Directional atherectomy right SFA in 01/2013  . Pneumonia 2000  . Pressure ulcer, lower back(707.03)   . Tobacco use   . Traumatic amputation of leg(s) (complete) (partial),  unilateral, at or above knee, without mention of complication    Past Surgical History:  Procedure Laterality Date  . ABDOMINAL AORTAGRAM N/A 02/23/2013   Procedure: ABDOMINAL Maxcine Ham;  Surgeon: Wellington Hampshire, MD;  Location: [place] CATH LAB;  Service: Cardiovascular;  Laterality: N/A;  . ABDOMINAL AORTAGRAM N/A 09/22/2014   Procedure: ABDOMINAL Maxcine Ham;  Surgeon: Elam Dutch, MD;  Location: [facility] CATH LAB;  Service: Cardiovascular;  Laterality: N/A;  . ACNE CYST REMOVAL    . AMPUTATION Right 09/09/2013   Procedure: AMPUTATION ABOVE KNEE;  Surgeon: Rosetta Posner, MD;  Location: [place];  Service: Vascular;  Laterality: Right;  . ATHERECTOMY N/A  03/02/2013   Procedure: ATHERECTOMY;  Surgeon: Wellington Hampshire, MD;  Location: [facility] CATH LAB;  Service: Cardiovascular;  Laterality: N/A;  . CARDIAC CATHETERIZATION N/A 09/03/2015   Procedure: Left Heart Cath and Coronary Angiography;  Surgeon: Leonie Man, MD;  Location: [place] CV LAB;  Service: Cardiovascular;  Laterality: N/A;  . CARDIAC CATHETERIZATION N/A 09/03/2015   Procedure: Coronary Balloon Angioplasty;  Surgeon: Leonie Man, MD;  Location: [place] CV LAB;  Service: Cardiovascular;  Laterality: N/A;  . CORONARY ANGIOPLASTY  09/03/2015  . ENDARTERECTOMY Left 08/16/2013   Procedure: Exploration of Left Neck/Fix Bleeding;  Surgeon: Elam Dutch, MD;  Location: [place];  Service: Vascular;  Laterality: Left;  . ENDARTERECTOMY FEMORAL Left 01/30/2016   Procedure: ENDARTERECTOMY LEFT FEMORAL ARTERY;  Surgeon: Elam Dutch, MD;  Location: [facility] OR;  Service: Vascular;  Laterality: Left;  . ESOPHAGOGASTRODUODENOSCOPY N/A 09/08/2013   Procedure: ESOPHAGOGASTRODUODENOSCOPY (EGD);  Surgeon: Cleotis Nipper, MD;  Location[facility] ENDOSCOPY;  Service: Endoscopy;  Laterality: N/A;  . ESOPHAGOGASTRODUODENOSCOPY (EGD) WITH PROPOFOL N/A 01/05/2014   Procedure: ESOPHAGOGASTRODUODENOSCOPY (EGD) WITH PROPOFOL;  Surgeon: Cleotis Nipper, MD;  Location: [facility] ENDOSCOPY;  Service: Endoscopy;  Laterality: N/A;  . FEMORAL ARTERY STENT Right 2003   [name] 09/22/2014  . FLEXIBLE SIGMOIDOSCOPY N/A 09/08/2013   Procedure: FLEXIBLE SIGMOIDOSCOPY;  Surgeon: Cleotis Nipper, MD;  Location: [facility] ENDOSCOPY;  Service: Endoscopy;  Laterality: N/A;  unprepp  . ILIAC ARTERY STENT Left 09/22/2014  . NASAL SINUS SURGERY  2004  . PAROTIDECTOMY Right 03/29/2014   Procedure: PAROTIDECTOMY;  Surgeon: Ascencion Dike, MD;  Location: [place];  Service: ENT;  Laterality: Right;  . PATCH ANGIOPLASTY Left 01/30/2016   Procedure: LEFT FEMORAL ARTYERY PATCH ANGIOPLASTY USING HEMASHIELD PLATINUM FINESSE PATCH;  Surgeon: Elam Dutch, MD;   Location: [place];  Service: Vascular;  Laterality: Left;  . PERIPHERAL VASCULAR CATHETERIZATION N/A 08/17/2015   Procedure: Abdominal Aortogram;  Surgeon: Elam Dutch, MD;  Location: [place] CV LAB;  Service: Cardiovascular;  Laterality: N/A;   Family History  Problem Relation Age of Onset  . Adopted: Yes  . Heart disease Maternal Grandfather   . 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 Smoking status: Current Every Day Smoker    Packs/day: 5.00    Years: 41.00    Types: Cigarettes  . Smokeless tobacco: Never Used     Comment: stated 1/2-3/4 ppd  . Alcohol use No     Comment: none since moving to nursing home in March 2015  . Drug use: No  . Sexual activity: Not Currently   Tobacco Counseling Ready to quit: Not Answered Counseling given: Not Answered   Activities of Daily Living In  your present state of health, do you have any difficulty performing the following activities: 04/06/2017  Hearing? N  Vision? N  Difficulty concentrating or making decisions? N  Walking or climbing stairs? Y  Dressing or bathing? N  Doing errands, shopping? Y  Preparing Food and eating ? Y  Using the Toilet? N  In the past six months, have you accidently leaked urine? N  Do you have problems with loss of bowel control? N  Managing your Medications? Y  Managing your Finances? N  Housekeeping or managing your Housekeeping? N  Some recent data might be hidden    Immunizations and Health Maintenance Immunization History  Administered Date(s) Administered  . DTP 05/28/2001  . Influenza-Unspecified 05/26/2013, 05/04/2014, 04/29/2016  . Pneumococcal-Unspecified 07/28/2009, 05/06/2016  . Tdap 12/30/2012   Health Maintenance Due  Topic Date Due  . Hepatitis C Screening  1956/05/06  . HIV Screening  11/24/1971  . COLONOSCOPY  11/24/2006    Patient Care Team: [name], MD  as PCP - General (Internal Medicine)  Indicate any recent Medical Services you may have received from other than [facility] providers in the past year (date may be approximate).    Assessment:   This is a routine wellness examination for Eryn.   Hearing/Vision screen No exam data present  Dietary issues and exercise activities discussed: Current Exercise Habits: The patient does not participate in regular exercise at present, Exercise limited by: orthopedic condition(s)  Goals    None     Depression Screen PHQ 2/9 Scores 04/06/2017 12/30/2012  PHQ - 2 Score 0 0    Fall Risk Fall Risk  04/06/2017 12/30/2012  Falls in the past year? No No    Cognitive Function:     6CIT Screen 04/06/2017  What Year? 0 points  What month? 0 points  What time? 0 points  Count back from 20 0 points  Months in reverse 0 points  Repeat phrase 2 points  Total Score 2    Screening Tests Health Maintenance  Topic Date Due  . Hepatitis C Screening  [birth year]  . HIV Screening  11/24/1971  . COLONOSCOPY  11/24/2006  . INFLUENZA VACCINE  06/27/2017 (Originally 02/25/2017)  . TETANUS/TDAP  12/31/2022        Plan:    I have personally reviewed and addressed the Medicare Annual Wellness questionnaire and have noted the following in the patient's chart:  A. Medical and social history B. Use of alcohol, tobacco or illicit drugs  C. Current medications and supplements D. Functional ability and status E.  Nutritional status F.  Physical activity G. Advance directives H. List of other physicians I.  Hospitalizations, surgeries, and ER visits in previous 12 months J.  [place] to include hearing, vision, cognitive, depression L. Referrals and appointments - none  In addition, I have reviewed and discussed with patient certain preventive protocols, quality metrics, and best practice recommendations. A written personalized care plan for preventive services as well as general preventive health  recommendations were provided to patient.  See attached scanned questionnaire for additional information.   Signed,   Rich Reining, RN Nurse Health Advisor   Quick Notes   Health Maintenance: Hep C screen and HIV screen due     Abnormal Screen: None     Patient Concerns: C/o being sleep deprived.     Nurse Concerns: None

## 2017-04-07 ENCOUNTER — Encounter (HOSPITAL_COMMUNITY)
Admission: RE | Admit: 2017-04-07 | Discharge: 2017-04-07 | Disposition: A | Discharge status: Home / Self Care | Payer: Medicare Other | Source: Skilled Nursing Facility | Attending: Internal Medicine | Admitting: Internal Medicine

## 2017-04-07 DIAGNOSIS — J449 Chronic obstructive pulmonary disease, unspecified: Secondary | ICD-10-CM | POA: Insufficient documentation

## 2017-04-07 DIAGNOSIS — Z89511 Acquired absence of right leg below knee: Secondary | ICD-10-CM | POA: Insufficient documentation

## 2017-04-07 DIAGNOSIS — G4700 Insomnia, unspecified: Secondary | ICD-10-CM | POA: Diagnosis not present

## 2017-04-07 DIAGNOSIS — I739 Peripheral vascular disease, unspecified: Secondary | ICD-10-CM | POA: Diagnosis not present

## 2017-04-07 DIAGNOSIS — K259 Gastric ulcer, unspecified as acute or chronic, without hemorrhage or perforation: Secondary | ICD-10-CM | POA: Insufficient documentation

## 2017-04-07 DIAGNOSIS — D649 Anemia, unspecified: Secondary | ICD-10-CM | POA: Insufficient documentation

## 2017-04-07 LAB — COMPREHENSIVE METABOLIC PANEL
ALBUMIN: 4 g/dL (ref 3.5–5.0)
ALT: 12 U/L — ABNORMAL LOW (ref 17–63)
ANION GAP: 9 (ref 5–15)
AST: 15 U/L (ref 15–41)
Alkaline Phosphatase: 52 U/L (ref 38–126)
BILIRUBIN TOTAL: 0.4 mg/dL (ref 0.3–1.2)
BUN: 34 mg/dL — ABNORMAL HIGH (ref 6–20)
CALCIUM: 9.4 mg/dL (ref 8.9–10.3)
CO2: 33 mmol/L — ABNORMAL HIGH (ref 22–32)
Chloride: 92 mmol/L — ABNORMAL LOW (ref 101–111)
Creatinine, Ser: 1.51 mg/dL — ABNORMAL HIGH (ref 0.61–1.24)
GFR calc non Af Amer: 49 mL/min — ABNORMAL LOW (ref >60)
GFR, EST AFRICAN AMERICAN: 56 mL/min — AB (ref >60)
GLUCOSE: 107 mg/dL — AB (ref 65–99)
POTASSIUM: 4.3 mmol/L (ref 3.5–5.1)
SODIUM: 134 mmol/L — AB (ref 135–145)
TOTAL PROTEIN: 7.4 g/dL (ref 6.5–8.1)

## 2017-04-07 LAB — CBC WITH DIFFERENTIAL/PLATELET
BASOS ABS: 0.1 10*3/uL (ref 0.0–0.1)
BASOS PCT: 1 %
EOS PCT: 4 %
Eosinophils Absolute: 0.2 10*3/uL (ref 0.0–0.7)
HCT: 48.1 % (ref 39.0–52.0)
Hemoglobin: 15.4 g/dL (ref 13.0–17.0)
Lymphocytes Relative: 28 %
Lymphs Abs: 1.8 10*3/uL (ref 0.7–4.0)
MCH: 29.1 pg (ref 26.0–34.0)
MCHC: 32 g/dL (ref 30.0–36.0)
MCV: 90.9 fL (ref 78.0–100.0)
MONO ABS: 0.3 10*3/uL (ref 0.1–1.0)
MONOS PCT: 5 %
Neutro Abs: 4 10*3/uL (ref 1.7–7.7)
Neutrophils Relative %: 62 %
PLATELETS: 161 10*3/uL (ref 150–400)
RBC: 5.29 MIL/uL (ref 4.22–5.81)
RDW: 15.4 % (ref 11.5–15.5)
WBC: 6.4 10*3/uL (ref 4.0–10.5)

## 2017-04-07 LAB — TSH: TSH: 3.404 u[IU]/mL (ref 0.350–4.500)

## 2017-04-08 ENCOUNTER — Encounter (HOSPITAL_COMMUNITY)
Admission: RE | Admit: 2017-04-08 | Discharge: 2017-04-08 | Disposition: A | Discharge status: Home / Self Care | Payer: Medicare Other | Source: Skilled Nursing Facility | Attending: Internal Medicine | Admitting: Internal Medicine

## 2017-04-08 DIAGNOSIS — Z89511 Acquired absence of right leg below knee: Secondary | ICD-10-CM | POA: Insufficient documentation

## 2017-04-08 DIAGNOSIS — G4700 Insomnia, unspecified: Secondary | ICD-10-CM | POA: Diagnosis not present

## 2017-04-08 DIAGNOSIS — I739 Peripheral vascular disease, unspecified: Secondary | ICD-10-CM | POA: Insufficient documentation

## 2017-04-08 DIAGNOSIS — J449 Chronic obstructive pulmonary disease, unspecified: Secondary | ICD-10-CM | POA: Diagnosis not present

## 2017-04-08 DIAGNOSIS — D649 Anemia, unspecified: Secondary | ICD-10-CM | POA: Diagnosis not present

## 2017-04-08 DIAGNOSIS — K259 Gastric ulcer, unspecified as acute or chronic, without hemorrhage or perforation: Secondary | ICD-10-CM | POA: Diagnosis not present

## 2017-04-08 LAB — URINE CULTURE: Culture: NO GROWTH

## 2017-04-09 LAB — HEPATITIS C ANTIBODY: HCV Ab: <0.1 s/co ratio (ref 0.0–0.9)

## 2017-04-09 LAB — HIV ANTIBODY (ROUTINE TESTING W REFLEX): HIV Screen 4th Generation wRfx: NONREACTIVE

## 2017-05-04 ENCOUNTER — Encounter: Payer: Self-pay | Admitting: Internal Medicine

## 2017-05-04 ENCOUNTER — Non-Acute Institutional Stay (SKILLED_NURSING_FACILITY): Payer: Medicare Other | Admitting: Internal Medicine

## 2017-05-04 DIAGNOSIS — N289 Disorder of kidney and ureter, unspecified: Secondary | ICD-10-CM | POA: Diagnosis not present

## 2017-05-04 DIAGNOSIS — J42 Unspecified chronic bronchitis: Secondary | ICD-10-CM | POA: Diagnosis not present

## 2017-05-04 DIAGNOSIS — R062 Wheezing: Secondary | ICD-10-CM | POA: Diagnosis not present

## 2017-05-04 DIAGNOSIS — R0989 Other specified symptoms and signs involving the circulatory and respiratory systems: Secondary | ICD-10-CM | POA: Diagnosis not present

## 2017-05-04 DIAGNOSIS — R05 Cough: Secondary | ICD-10-CM | POA: Diagnosis not present

## 2017-05-04 NOTE — Progress Notes (Signed)
Location:   [place] Room Number: 107/W Place of Service:  SNF [phone]) Provider:  Freddi Starr, MD  Patient Care Team: [name], MD as PCP - General (Internal Medicine)  Extended Emergency Contact Information Primary Emergency Contact: [place], [place] [address] [place] Mobile Phone: [phone] Relation: Daughter  Code Status:  Full Code Goals of care: Advanced Directive information Advanced Directives 05/04/2017  Does Patient Have a Medical Advance Directive? Yes  Type of Advance Directive (No Data)  Does patient want to make changes to medical advance directive? No - Patient declined  Copy of [place] in Chart? -  Would patient like information on creating a medical advance directive? No - Patient declined  Pre-existing out of facility DNR order (yellow form or pink MOST form) -     Acute visit secondary to chest congestion cough  HPI:  Pt is a 61 y.o. male seen today for an acute visit for complaints of cough and chest congestion this weekend.  Chest x-ray is pending.  He does have a history of COPD-and continues onIncruse Ellipta routinely-he continues to smoke as well.  Also has a history of congestive heart failure he is on Lasix a--ECHO 2015 showed ejection fraction of 55%.  This is been stable     Apparently  this weekend he developed some increased cough and congestion- feels this has gotten better today  Vital signs are stable today.          Past Medical History:  Diagnosis Date  . Anemia, unspecified   . Arthritis    "shoulders" (09/22/2014)  . Asthma   . Atrial fibrillation ([place])   . Carotid artery occlusion   . CHF (congestive heart failure) ([place])   . COPD 11/27/2007   Qualifier: Diagnosis of  By: Garen Grams    . Coronary artery disease    Cardiac catheterization in 2008 showed 99% stenosis in proximal left circumflex which was  treated with Taxus drug-eluting stent. There was mild RCA and LAD disease with normal ejection fraction.  Marland Kitchen ERECTILE DYSFUNCTION 11/27/2007   Qualifier: Diagnosis of  By: Garen Grams    . Gastric ulcer, unspecified as acute or chronic, without mention of hemorrhage, perforation, or obstruction   . Heart murmur   . HYPERLIPIDEMIA 11/27/2007   Qualifier: Diagnosis of  By: Garen Grams    . Hypertension   . HYPERTENSION, BENIGN 05/23/2009   Qualifier: Diagnosis of  By: Melvyn Novas MD, Christena Deem   . Other severe protein-calorie malnutrition   . PAD (peripheral artery disease) (HCC)    Previous right SFA stent in 2003. Directional atherectomy right SFA in 01/2013  . Pneumonia 2000  . Pressure ulcer, lower back(707.03)   . Tobacco use   . Traumatic amputation of leg(s) (complete) (partial), unilateral, at or above knee, without mention of complication    Past Surgical History:  Procedure Laterality Date  . ABDOMINAL AORTAGRAM N/A 02/23/2013   Procedure: ABDOMINAL Maxcine Ham;  Surgeon: Wellington Hampshire, MD;  Location: [place] CATH LAB;  Service: Cardiovascular;  Laterality: N/A;  . ABDOMINAL AORTAGRAM N/A 09/22/2014   Procedure: ABDOMINAL Maxcine Ham;  Surgeon: Elam Dutch, MD;  Location[facility] CATH LAB;  Service: Cardiovascular;  Laterality: N/A;  . ACNE CYST REMOVAL    . AMPUTATION Right 09/09/2013   Procedure: AMPUTATION ABOVE KNEE;  Surgeon: Rosetta Posner, MD;  Location: [place];  Service: Vascular;  Laterality: Right;  . ATHERECTOMY N/A 03/02/2013   Procedure: ATHERECTOMY;  Surgeon: Wellington Hampshire, MD;  Location: [facility] CATH LAB;  Service: Cardiovascular;  Laterality: N/A;  . CARDIAC CATHETERIZATION N/A 09/03/2015   Procedure: Left Heart Cath and Coronary Angiography;  Surgeon: Leonie Man, MD;  Location: [place] CV LAB;  Service: Cardiovascular;  Laterality: N/A;  . CARDIAC CATHETERIZATION N/A 09/03/2015   Procedure: Coronary Balloon Angioplasty;  Surgeon: Leonie Man, MD;  Location: [place]  CV LAB;  Service: Cardiovascular;  Laterality: N/A;  . CORONARY ANGIOPLASTY  09/03/2015  . ENDARTERECTOMY Left 08/16/2013   Procedure: Exploration of Left Neck/Fix Bleeding;  Surgeon: Elam Dutch, MD;  Location: [place];  Service: Vascular;  Laterality: Left;  . ENDARTERECTOMY FEMORAL Left 01/30/2016   Procedure: ENDARTERECTOMY LEFT FEMORAL ARTERY;  Surgeon: Elam Dutch, MD;  Location: [facility] OR;  Service: Vascular;  Laterality: Left;  . ESOPHAGOGASTRODUODENOSCOPY N/A 09/08/2013   Procedure: ESOPHAGOGASTRODUODENOSCOPY (EGD);  Surgeon: Cleotis Nipper, MD;  Location[facility] ENDOSCOPY;  Service: Endoscopy;  Laterality: N/A;  . ESOPHAGOGASTRODUODENOSCOPY (EGD) WITH PROPOFOL N/A 01/05/2014   Procedure: ESOPHAGOGASTRODUODENOSCOPY (EGD) WITH PROPOFOL;  Surgeon: Cleotis Nipper, MD;  Location: [facility] ENDOSCOPY;  Service: Endoscopy;  Laterality: N/A;  . FEMORAL ARTERY STENT Right 2003   [name] 09/22/2014  . FLEXIBLE SIGMOIDOSCOPY N/A 09/08/2013   Procedure: FLEXIBLE SIGMOIDOSCOPY;  Surgeon: Cleotis Nipper, MD;  Location[facility] ENDOSCOPY;  Service: Endoscopy;  Laterality: N/A;  unprepp  . ILIAC ARTERY STENT Left 09/22/2014  . NASAL SINUS SURGERY  2004  . PAROTIDECTOMY Right 03/29/2014   Procedure: PAROTIDECTOMY;  Surgeon: Ascencion Dike, MD;  Location: [place];  Service: ENT;  Laterality: Right;  . PATCH ANGIOPLASTY Left 01/30/2016   Procedure: LEFT FEMORAL ARTYERY PATCH ANGIOPLASTY USING HEMASHIELD PLATINUM FINESSE PATCH;  Surgeon: Elam Dutch, MD;  Location: [place];  Service: Vascular;  Laterality: Left;  . PERIPHERAL VASCULAR CATHETERIZATION N/A 08/17/2015   Procedure: Abdominal Aortogram;  Surgeon: Elam Dutch, MD;  Location: [place] CV LAB;  Service: Cardiovascular;  Laterality: N/A;    Allergies  Allergen Reactions  . Codeine Nausea Only    Outpatient Encounter Prescriptions as of 05/04/2017  Medication Sig  . ALPRAZolam (XANAX) 0.5 MG tablet Take 0.5 mg by mouth at bedtime as needed for anxiety.    Marland Kitchen aspirin EC 81 MG tablet Take 1 tablet (81 mg total) by mouth daily.  Marland Kitchen augmented betamethasone dipropionate (DIPROLENE-AF) 0.05 % cream Apply 1 application topically 2 (two) times daily.  . cloNIDine (CATAPRES) 0.2 MG tablet Take 0.2 mg by mouth 2 (two) times daily.  . clopidogrel (PLAVIX) 75 MG tablet Take 75 mg by mouth daily. @@ 9:00 pm  . colchicine 0.6 MG tablet Take 0.3 mg by mouth daily.   . furosemide (LASIX) 20 MG tablet Take 20 mg by mouth daily. Sunday, Tuesday, Thursday, Saturday  . furosemide (LASIX) 40 MG tablet Take 40 mg by mouth daily. Mon,Wed,Fri  . isosorbide mononitrate (IMDUR) 30 MG 24 hr tablet Take 1 tablet (30 mg total) by mouth daily.  Marland Kitchen lisinopril (PRINIVIL,ZESTRIL) 40 MG tablet Take 40 mg by mouth daily. Reported on 11/22/2015  . metoprolol tartrate (LOPRESSOR) 25 MG tablet Take 12.5 mg by mouth 2 (two) times daily.  Marland Kitchen oxyCODONE (ROXICODONE) 15 MG immediate release tablet Take one tablet by mouth every 4 hours as needed for pain HOLD for sedation, resp depression  . pantoprazole (PROTONIX) 40 MG tablet Take 40  mg by mouth daily.  . potassium chloride SA (K-DUR,KLOR-CON) 20 MEQ tablet Take 20 mEq by mouth daily.   . simvastatin (ZOCOR) 40 MG tablet Take 40 mg by mouth daily.  Marland Kitchen umeclidinium bromide (INCRUSE ELLIPTA) 62.5 MCG/INH AEPB Inhale 1 puff into the lungs daily.   No facility-administered encounter medications on file as of 05/04/2017.     Review of Systems  In general is not complaining of any fever or chills.  Skin does not complain of any diaphoresis or itching.  Head ears eyes nose mouth and throat does not complain of nasal or eye discharge or sore throat or visual changes or headache.  Respiratory says his breathing is better feels chest congestion and cough also has improved somewhat compared to this weekend area  Cardiac denies chest pain or increased lower extremity edema from baseline.  GI does not complaining of any abdominal discomfort  nausea vomiting diarrhea constipation continues to have a good appetite.  Musculoskeletal has a history of shoulder discomfort is on oxycodone routinely otherwise does not complain of joint pain e.  Neurologic does not complain of dizziness headache or numbness.   Psych does not complain of being anxious or depressed     Immunization History  Administered Date(s) Administered  . DTP 05/28/2001  . Influenza-Unspecified 05/26/2013, 05/04/2014, 04/29/2016  . Pneumococcal-Unspecified 07/28/2009, 05/06/2016  . Tdap 12/30/2012   Pertinent  Health Maintenance Due  Topic Date Due  . COLONOSCOPY  11/24/2006  . INFLUENZA VACCINE  06/27/2017 (Originally 02/25/2017)   Fall Risk  04/06/2017 12/30/2012  Falls in the past year? No No   Functional Status Survey:    Vitals:   05/04/17 1115  BP: 128/74  Pulse: 84  Resp: 20  Temp: 97.6 F (36.4 C)  TempSrc: Oral    Physical Exam  In general this is a pleasant middle-aged male in no distress   Skin is warm and dry.  Oropharynx is clear mucous membranes moist.  Eyes could not really appreciate any drainage visual acuity appears grossly intact.  Chest he has diffuse rhonchi in all lung fields this is relatively baseline there is no labored breathing continues to have chronic congested sounds however.  Heart is regular rate and rhythm without murmur gallop or rub he has compression device on his left lower leg he is status post right above-the-knee amputation.  Abdomen is soft obese nontender with positive bowel sounds.  Muscle skeletal moves extremities at baseline again is status post right above-the-knee amputation-continues to ambulate without difficulty and wheelchair.  Neurologic is grossly intact to speech is clear no lateralizing findings   psych he is alert and oriented pleasant and appropriate   Labs reviewed:  Recent Labs  12/16/16 0731 02/24/17 0727 04/07/17 0700  NA 137 135 134*  K 3.8 4.2 4.3  CL 93* 95* 92*    CO2 32 31 33*  GLUCOSE 91 108* 107*  BUN 29* 30* 34*  CREATININE 1.52* 1.43* 1.51*  CALCIUM 9.6 8.9 9.4    Recent Labs  10/24/16 0212 02/24/17 0727 04/07/17 0700  AST 18 14* 15  ALT 13* 13* 12*  ALKPHOS 51 50 52  BILITOT 0.7 0.6 0.4  PROT 6.8 7.0 7.4  ALBUMIN 4.0 3.9 4.0    Recent Labs  12/16/16 0731 02/24/17 0727 04/07/17 0700  WBC 7.8 6.9 6.4  NEUTROABS 4.9 4.5 4.0  HGB 15.5 15.7 15.4  HCT 47.6 47.7 48.1  MCV 92.2 90.7 90.9  PLT 180 149* 161   Lab Results  Component Value Date   TSH 3.404 04/07/2017   Lab Results  Component Value Date   HGBA1C 5.6 10/24/2016   Lab Results  Component Value Date   CHOL 131 02/24/2017   HDL 30 (L) 02/24/2017   LDLCALC 53 02/24/2017   TRIG 241 (H) 02/24/2017   CHOLHDL 4.4 02/24/2017    Significant Diagnostic Results in last 30 days:  No results found.  Assessment/Plan  #1-cough congestion-will await chest x-ray results also will order duo nebs every 6 hours when necessary for 7 days-also will start Mucinex 600 mg twice a day for 7 days.  Monitor vital signs with pulse ox every shift for the next 48 hours.  #2-history renal insufficiency creatinine 1.51 a BUN of 34 lab done September appears relatively baseline Will update this 9 on electrolytes and renal function.  Update we have obtain results of the chest x-ray which shows mild CHF-will continue orders as noted above also will order a BNP.  Continue to monitor clinical status.   [id number]

## 2017-05-05 ENCOUNTER — Non-Acute Institutional Stay (SKILLED_NURSING_FACILITY): Payer: Medicare Other | Admitting: Internal Medicine

## 2017-05-05 ENCOUNTER — Encounter: Payer: Self-pay | Admitting: Internal Medicine

## 2017-05-05 ENCOUNTER — Encounter (HOSPITAL_COMMUNITY)
Admission: RE | Admit: 2017-05-05 | Discharge: 2017-05-05 | Disposition: A | Discharge status: Home / Self Care | Payer: Medicare Other | Source: Skilled Nursing Facility | Attending: Internal Medicine | Admitting: Internal Medicine

## 2017-05-05 DIAGNOSIS — Z89511 Acquired absence of right leg below knee: Secondary | ICD-10-CM | POA: Diagnosis not present

## 2017-05-05 DIAGNOSIS — K259 Gastric ulcer, unspecified as acute or chronic, without hemorrhage or perforation: Secondary | ICD-10-CM | POA: Insufficient documentation

## 2017-05-05 DIAGNOSIS — F1310 Sedative, hypnotic or anxiolytic abuse, uncomplicated: Secondary | ICD-10-CM | POA: Diagnosis not present

## 2017-05-05 DIAGNOSIS — J441 Chronic obstructive pulmonary disease with (acute) exacerbation: Secondary | ICD-10-CM

## 2017-05-05 DIAGNOSIS — E785 Hyperlipidemia, unspecified: Secondary | ICD-10-CM | POA: Diagnosis not present

## 2017-05-05 DIAGNOSIS — J449 Chronic obstructive pulmonary disease, unspecified: Secondary | ICD-10-CM | POA: Diagnosis not present

## 2017-05-05 DIAGNOSIS — I739 Peripheral vascular disease, unspecified: Secondary | ICD-10-CM | POA: Diagnosis not present

## 2017-05-05 DIAGNOSIS — G4700 Insomnia, unspecified: Secondary | ICD-10-CM | POA: Diagnosis not present

## 2017-05-05 LAB — BASIC METABOLIC PANEL
ANION GAP: 12 (ref 5–15)
BUN: 26 mg/dL — ABNORMAL HIGH (ref 6–20)
CALCIUM: 8.9 mg/dL (ref 8.9–10.3)
CO2: 33 mmol/L — ABNORMAL HIGH (ref 22–32)
Chloride: 89 mmol/L — ABNORMAL LOW (ref 101–111)
Creatinine, Ser: 1.68 mg/dL — ABNORMAL HIGH (ref 0.61–1.24)
GFR calc Af Amer: 49 mL/min — ABNORMAL LOW (ref >60)
GFR, EST NON AFRICAN AMERICAN: 43 mL/min — AB (ref >60)
GLUCOSE: 136 mg/dL — AB (ref 65–99)
Potassium: 4 mmol/L (ref 3.5–5.1)
SODIUM: 134 mmol/L — AB (ref 135–145)

## 2017-05-05 NOTE — Progress Notes (Signed)
Location:   [facility] Room Number: 107/W Place of Service:  SNF [phone]) Provider:  Kyung Rudd, Rene Kocher, MD  Patient Care Team: [name], MD as PCP - General (Internal Medicine)  Extended Emergency Contact Information Primary Emergency Contact: Harvel Quale, [place] [address] [place] Mobile Phone: [phone] Relation: Daughter  Code Status:  Full Code Goals of care: Advanced Directive information Advanced Directives 05/05/2017  Does Patient Have a Medical Advance Directive? Yes  Type of Advance Directive (No Data)  Does patient want to make changes to medical advance directive? No - Patient declined  Copy of [place] in Chart? No - copy requested  Would patient like information on creating a medical advance directive? No - Patient declined  Pre-existing out of facility DNR order (yellow form or pink MOST form) -     Chief Complaint  Patient presents with  . Acute Visit    Acute Visit    HPI:  Pt is a 61 y.o. male seen today for an acute visit for Cough and SOB  Patient has H/O Severe Peripheral Artery disease,S/P Right AKA. S/P Left femoral Endarterectomy 02/09/16 , Hypertension , CAD s/p PTCA, CHF, Hyperlipidemia, COPD, Continuous to smoke, Carotidstenosis. And Arthritis on Chronic Narcotics.  Patient has been c/o Productive Cough with greenish sputum and SOB. He was evaluated yesterday and was started on Mucinex and Bronchodilators. He also had Chest Xray done which showed mild Pulmonary congestion but no infiltrate. Patient continues to have wheezing SOB and Cough. No Chest pain or fever. He says he just feel bad. Patient does continue to go out of facility and smoke. He also wants to know if we can increase his xanax as he is unable to sleep at night. Patient dose was reduced as he was having increase daytime drowziness.    Past Medical History:  Diagnosis Date  . Anemia,  unspecified   . Arthritis    "shoulders" (09/22/2014)  . Asthma   . Atrial fibrillation ([place])   . Carotid artery occlusion   . CHF (congestive heart failure) ([place])   . COPD 11/27/2007   Qualifier: Diagnosis of  By: Garen Grams    . Coronary artery disease    Cardiac catheterization in 2008 showed 99% stenosis in proximal left circumflex which was treated with Taxus drug-eluting stent. There was mild RCA and LAD disease with normal ejection fraction.  Marland Kitchen ERECTILE DYSFUNCTION 11/27/2007   Qualifier: Diagnosis of  By: Garen Grams    . Gastric ulcer, unspecified as acute or chronic, without mention of hemorrhage, perforation, or obstruction   . Heart murmur   . HYPERLIPIDEMIA 11/27/2007   Qualifier: Diagnosis of  By: Garen Grams    . Hypertension   . HYPERTENSION, BENIGN 05/23/2009   Qualifier: Diagnosis of  By: Melvyn Novas MD, Christena Deem   . Other severe protein-calorie malnutrition   . PAD (peripheral artery disease) (HCC)    Previous right SFA stent in 2003. Directional atherectomy right SFA in 01/2013  . Pneumonia 2000  . Pressure ulcer, lower back(707.03)   . Tobacco use   . Traumatic amputation of leg(s) (complete) (partial), unilateral, at or above knee, without mention of complication    Past Surgical History:  Procedure Laterality Date  . ABDOMINAL AORTAGRAM N/A 02/23/2013   Procedure: ABDOMINAL Maxcine Ham;  Surgeon: Wellington Hampshire, MD;  Location: [facility] CATH LAB;  Service: Cardiovascular;  Laterality: N/A;  .  ABDOMINAL AORTAGRAM N/A 09/22/2014   Procedure: ABDOMINAL Maxcine Ham;  Surgeon: Elam Dutch, MD;  Location: [facility] CATH LAB;  Service: Cardiovascular;  Laterality: N/A;  . ACNE CYST REMOVAL    . AMPUTATION Right 09/09/2013   Procedure: AMPUTATION ABOVE KNEE;  Surgeon: Rosetta Posner, MD;  Location: [facility];  Service: Vascular;  Laterality: Right;  . ATHERECTOMY N/A 03/02/2013   Procedure: ATHERECTOMY;  Surgeon: Wellington Hampshire, MD;  Location: [facility] CATH LAB;  Service:  Cardiovascular;  Laterality: N/A;  . CARDIAC CATHETERIZATION N/A 09/03/2015   Procedure: Left Heart Cath and Coronary Angiography;  Surgeon: Leonie Man, MD;  Location: [facility] CV LAB;  Service: Cardiovascular;  Laterality: N/A;  . CARDIAC CATHETERIZATION N/A 09/03/2015   Procedure: Coronary Balloon Angioplasty;  Surgeon: Leonie Man, MD;  Location: [facility] CV LAB;  Service: Cardiovascular;  Laterality: N/A;  . CORONARY ANGIOPLASTY  09/03/2015  . ENDARTERECTOMY Left 08/16/2013   Procedure: Exploration of Left Neck/Fix Bleeding;  Surgeon: Elam Dutch, MD;  Location: [facility];  Service: Vascular;  Laterality: Left;  . ENDARTERECTOMY FEMORAL Left 01/30/2016   Procedure: ENDARTERECTOMY LEFT FEMORAL ARTERY;  Surgeon: Elam Dutch, MD;  Location: [facility] OR;  Service: Vascular;  Laterality: Left;  . ESOPHAGOGASTRODUODENOSCOPY N/A 09/08/2013   Procedure: ESOPHAGOGASTRODUODENOSCOPY (EGD);  Surgeon: Cleotis Nipper, MD;  Location: [facility] ENDOSCOPY;  Service: Endoscopy;  Laterality: N/A;  . ESOPHAGOGASTRODUODENOSCOPY (EGD) WITH PROPOFOL N/A 01/05/2014   Procedure: ESOPHAGOGASTRODUODENOSCOPY (EGD) WITH PROPOFOL;  Surgeon: Cleotis Nipper, MD;  Location: [facility] ENDOSCOPY;  Service: Endoscopy;  Laterality: N/A;  . FEMORAL ARTERY STENT Right 2003   [name] 09/22/2014  . FLEXIBLE SIGMOIDOSCOPY N/A 09/08/2013   Procedure: FLEXIBLE SIGMOIDOSCOPY;  Surgeon: Cleotis Nipper, MD;  Location: [facility] ENDOSCOPY;  Service: Endoscopy;  Laterality: N/A;  unprepp  . ILIAC ARTERY STENT Left 09/22/2014  . NASAL SINUS SURGERY  2004  . PAROTIDECTOMY Right 03/29/2014   Procedure: PAROTIDECTOMY;  Surgeon: Ascencion Dike, MD;  Location: [facility];  Service: ENT;  Laterality: Right;  . PATCH ANGIOPLASTY Left 01/30/2016   Procedure: LEFT FEMORAL ARTYERY PATCH ANGIOPLASTY USING HEMASHIELD PLATINUM FINESSE PATCH;  Surgeon: Elam Dutch, MD;  Location: [facility];  Service: Vascular;  Laterality: Left;  . PERIPHERAL VASCULAR CATHETERIZATION N/A  08/17/2015   Procedure: Abdominal Aortogram;  Surgeon: Elam Dutch, MD;  Location: [facility] CV LAB;  Service: Cardiovascular;  Laterality: N/A;    Allergies  Allergen Reactions  . Codeine Nausea Only    Outpatient Encounter Prescriptions as of 05/05/2017  Medication Sig  . ALPRAZolam (XANAX) 0.5 MG tablet Take 0.5 mg by mouth at bedtime as needed for anxiety.  Marland Kitchen aspirin EC 81 MG tablet Take 1 tablet (81 mg total) by mouth daily.  Marland Kitchen augmented betamethasone dipropionate (DIPROLENE-AF) 0.05 % cream Apply 1 application topically 2 (two) times daily.  . cloNIDine (CATAPRES) 0.2 MG tablet Take 0.2 mg by mouth 2 (two) times daily.  . clopidogrel (PLAVIX) 75 MG tablet Take 75 mg by mouth daily. @@ 9:00 pm  . colchicine 0.6 MG tablet Take 0.3 mg by mouth daily.   . furosemide (LASIX) 20 MG tablet Take 20 mg by mouth daily. Sunday, Tuesday, Thursday, Saturday  . furosemide (LASIX) 40 MG tablet Take 40 mg by mouth daily. Mon,Wed,Fri  . guaiFENesin (MUCINEX) 600 MG 12 hr tablet Take 600 mg by mouth 2 (two) times daily as needed. Times 7 days  . ipratropium-albuterol (DUONEB) 0.5-2.5 (3) MG/3ML SOLN Take 3  mLs by nebulization every 6 (six) hours as needed. Give times 7 days  . isosorbide mononitrate (IMDUR) 30 MG 24 hr tablet Take 1 tablet (30 mg total) by mouth daily.  Marland Kitchen lisinopril (PRINIVIL,ZESTRIL) 40 MG tablet Take 40 mg by mouth daily. Reported on 11/22/2015  . metoprolol tartrate (LOPRESSOR) 25 MG tablet Take 12.5 mg by mouth 2 (two) times daily.  Marland Kitchen oxyCODONE (ROXICODONE) 15 MG immediate release tablet Take one tablet by mouth every 4 hours as needed for pain HOLD for sedation, resp depression  . pantoprazole (PROTONIX) 40 MG tablet Take 40 mg by mouth daily.  . potassium chloride SA (K-DUR,KLOR-CON) 20 MEQ tablet Take 20 mEq by mouth daily.   . simvastatin (ZOCOR) 40 MG tablet Take 40 mg by mouth daily.  Marland Kitchen umeclidinium bromide (INCRUSE ELLIPTA) 62.5 MCG/INH AEPB Inhale 1 puff into the  lungs daily.   No facility-administered encounter medications on file as of 05/05/2017.      Review of Systems  Constitutional: Positive for activity change. Negative for fever.  HENT: Positive for congestion.   Respiratory: Positive for cough, shortness of breath and wheezing.   Cardiovascular: Negative for chest pain, palpitations and leg swelling.  Gastrointestinal: Negative.   Genitourinary: Negative.   Musculoskeletal: Negative.   Psychiatric/Behavioral: Positive for sleep disturbance.  All other systems reviewed and are negative.   Immunization History  Administered Date(s) Administered  . DTP 05/28/2001  . Influenza-Unspecified 05/26/2013, 05/04/2014, 04/29/2016  . Pneumococcal-Unspecified 07/28/2009, 05/06/2016  . Tdap 12/30/2012   Pertinent  Health Maintenance Due  Topic Date Due  . COLONOSCOPY  11/24/2006  . INFLUENZA VACCINE  06/27/2017 (Originally 02/25/2017)   Fall Risk  04/06/2017 12/30/2012  Falls in the past year? No No   Functional Status Survey:    Vitals:   05/05/17 1401  BP: (!) 149/79  Pulse: 97  Resp: 17  Temp: 98.3 F (36.8 C)  TempSrc: Oral  SpO2: 91%   There is no height or weight on file to calculate BMI. Physical Exam  Constitutional: He is oriented to person, place, and time. He appears well-developed and well-nourished.  HENT:  Head: Normocephalic.  Mouth/Throat: Oropharynx is clear and moist.  Eyes: Pupils are equal, round, and reactive to light.  Neck: Neck supple.  Cardiovascular: Normal rate and normal heart sounds.   Pulmonary/Chest: Effort normal.  Patient has both Inspiratory and Expiratory Wheezin Bilateral  Abdominal: Soft. Bowel sounds are normal. He exhibits no distension. There is no tenderness. There is no rebound.  Musculoskeletal:  Trace edema in Left leg. Has compression stockings  Neurological: He is alert and oriented to person, place, and time.  Skin: Skin is warm and dry.  Psychiatric: He has a normal mood and  affect. His behavior is normal.    Labs reviewed:  Recent Labs  02/24/17 0727 04/07/17 0700 05/05/17 0715  NA 135 134* 134*  K 4.2 4.3 4.0  CL 95* 92* 89*  CO2 31 33* 33*  GLUCOSE 108* 107* 136*  BUN 30* 34* 26*  CREATININE 1.43* 1.51* 1.68*  CALCIUM 8.9 9.4 8.9    Recent Labs  10/24/16 0212 02/24/17 0727 04/07/17 0700  AST 18 14* 15  ALT 13* 13* 12*  ALKPHOS 51 50 52  BILITOT 0.7 0.6 0.4  PROT 6.8 7.0 7.4  ALBUMIN 4.0 3.9 4.0    Recent Labs  12/16/16 0731 02/24/17 0727 04/07/17 0700  WBC 7.8 6.9 6.4  NEUTROABS 4.9 4.5 4.0  HGB 15.5 15.7 15.4  HCT 47.6  47.7 48.1  MCV 92.2 90.7 90.9  PLT 180 149* 161   Lab Results  Component Value Date   TSH 3.404 04/07/2017   Lab Results  Component Value Date   HGBA1C 5.6 10/24/2016   Lab Results  Component Value Date   CHOL 131 02/24/2017   HDL 30 (L) 02/24/2017   LDLCALC 53 02/24/2017   TRIG 241 (H) 02/24/2017   CHOLHDL 4.4 02/24/2017    Significant Diagnostic Results in last 30 days:  No results found.  Assessment/Plan  COPD Exacerbation Start Patient on Prednisone 40 mg for 5 days Continue Duo Nebs Also start him on Zithromax. Continue Mucinex. Patient needs to avoid smoking. POX QD supplement with Oxygen if needed.  Chronic use of Narcotics and xanax D/W patient again that would not increase the dose as it causes him to have excessive Drowsiness. patient again insisting he is unable to sleep. No Changes made to his dose.  Family/ staff Communication:   Labs/tests ordered:

## 2017-05-19 ENCOUNTER — Encounter: Payer: Self-pay | Admitting: Internal Medicine

## 2017-05-19 NOTE — Progress Notes (Signed)
Location:   [facility] Room Number: 107/W Place of Service:  SNF [phone]) Provider:  Kyung Rudd, Rene Kocher, MD  Patient Care Team: [name], MD as PCP - General (Internal Medicine)  Extended Emergency Contact Information Primary Emergency Contact: Harvel Quale, [place] [address] [place] Mobile Phone: [phone] Relation: Daughter  Code Status:  Full Code Goals of care: Advanced Directive information Advanced Directives 05/19/2017  Does Patient Have a Medical Advance Directive? Yes  Type of Advance Directive (No Data)  Does patient want to make changes to medical advance directive? No - Patient declined  Copy of [place] in Chart? No - copy requested  Would patient like information on creating a medical advance directive? No - Patient declined  Pre-existing out of facility DNR order (yellow form or pink MOST form) -     Chief Complaint  Patient presents with  . Medical Management of Chronic Issues    Routine Visit    HPI:  Pt is a 61 y.o. male seen today for medical management of chronic diseases.     Past Medical History:  Diagnosis Date  . Anemia, unspecified   . Arthritis    "shoulders" (09/22/2014)  . Asthma   . Atrial fibrillation ([place])   . Carotid artery occlusion   . CHF (congestive heart failure) ([place])   . COPD 11/27/2007   Qualifier: Diagnosis of  By: Garen Grams    . Coronary artery disease    Cardiac catheterization in 2008 showed 99% stenosis in proximal left circumflex which was treated with Taxus drug-eluting stent. There was mild RCA and LAD disease with normal ejection fraction.  Marland Kitchen ERECTILE DYSFUNCTION 11/27/2007   Qualifier: Diagnosis of  By: Garen Grams    . Gastric ulcer, unspecified as acute or chronic, without mention of hemorrhage, perforation, or obstruction   . Heart murmur   . HYPERLIPIDEMIA 11/27/2007   Qualifier: Diagnosis of  By: Garen Grams     . Hypertension   . HYPERTENSION, BENIGN 05/23/2009   Qualifier: Diagnosis of  By: Melvyn Novas MD, Christena Deem   . Other severe protein-calorie malnutrition   . PAD (peripheral artery disease) (HCC)    Previous right SFA stent in 2003. Directional atherectomy right SFA in 01/2013  . Pneumonia 2000  . Pressure ulcer, lower back(707.03)   . Tobacco use   . Traumatic amputation of leg(s) (complete) (partial), unilateral, at or above knee, without mention of complication    Past Surgical History:  Procedure Laterality Date  . ABDOMINAL AORTAGRAM N/A 02/23/2013   Procedure: ABDOMINAL Maxcine Ham;  Surgeon: Wellington Hampshire, MD;  Location: [facility] CATH LAB;  Service: Cardiovascular;  Laterality: N/A;  . ABDOMINAL AORTAGRAM N/A 09/22/2014   Procedure: ABDOMINAL Maxcine Ham;  Surgeon: Elam Dutch, MD;  Location: [facility] CATH LAB;  Service: Cardiovascular;  Laterality: N/A;  . ACNE CYST REMOVAL    . AMPUTATION Right 09/09/2013   Procedure: AMPUTATION ABOVE KNEE;  Surgeon: Rosetta Posner, MD;  Location: [facility];  Service: Vascular;  Laterality: Right;  . ATHERECTOMY N/A 03/02/2013   Procedure: ATHERECTOMY;  Surgeon: Wellington Hampshire, MD;  Location: [facility] CATH LAB;  Service: Cardiovascular;  Laterality: N/A;  . CARDIAC CATHETERIZATION N/A 09/03/2015   Procedure: Left Heart Cath and Coronary Angiography;  Surgeon: Leonie Man, MD;  Location: [facility] CV LAB;  Service: Cardiovascular;  Laterality: N/A;  . CARDIAC CATHETERIZATION N/A 09/03/2015  Procedure: Coronary Balloon Angioplasty;  Surgeon: Leonie Man, MD;  Location: [place] CV LAB;  Service: Cardiovascular;  Laterality: N/A;  . CORONARY ANGIOPLASTY  09/03/2015  . ENDARTERECTOMY Left 08/16/2013   Procedure: Exploration of Left Neck/Fix Bleeding;  Surgeon: Elam Dutch, MD;  Location: [facility];  Service: Vascular;  Laterality: Left;  . ENDARTERECTOMY FEMORAL Left 01/30/2016   Procedure: ENDARTERECTOMY LEFT FEMORAL ARTERY;  Surgeon: Elam Dutch, MD;   Location: [facility] OR;  Service: Vascular;  Laterality: Left;  . ESOPHAGOGASTRODUODENOSCOPY N/A 09/08/2013   Procedure: ESOPHAGOGASTRODUODENOSCOPY (EGD);  Surgeon: Cleotis Nipper, MD;  Location: [facility] ENDOSCOPY;  Service: Endoscopy;  Laterality: N/A;  . ESOPHAGOGASTRODUODENOSCOPY (EGD) WITH PROPOFOL N/A 01/05/2014   Procedure: ESOPHAGOGASTRODUODENOSCOPY (EGD) WITH PROPOFOL;  Surgeon: Cleotis Nipper, MD;  Location: [facility] ENDOSCOPY;  Service: Endoscopy;  Laterality: N/A;  . FEMORAL ARTERY STENT Right 2003   [name] 09/22/2014  . FLEXIBLE SIGMOIDOSCOPY N/A 09/08/2013   Procedure: FLEXIBLE SIGMOIDOSCOPY;  Surgeon: Cleotis Nipper, MD;  Location: [facility] ENDOSCOPY;  Service: Endoscopy;  Laterality: N/A;  unprepp  . ILIAC ARTERY STENT Left 09/22/2014  . NASAL SINUS SURGERY  2004  . PAROTIDECTOMY Right 03/29/2014   Procedure: PAROTIDECTOMY;  Surgeon: Ascencion Dike, MD;  Location: [facility];  Service: ENT;  Laterality: Right;  . PATCH ANGIOPLASTY Left 01/30/2016   Procedure: LEFT FEMORAL ARTYERY PATCH ANGIOPLASTY USING HEMASHIELD PLATINUM FINESSE PATCH;  Surgeon: Elam Dutch, MD;  Location: [facility];  Service: Vascular;  Laterality: Left;  . PERIPHERAL VASCULAR CATHETERIZATION N/A 08/17/2015   Procedure: Abdominal Aortogram;  Surgeon: Elam Dutch, MD;  Location: [place] [facility] CV LAB;  Service: Cardiovascular;  Laterality: N/A;    Allergies  Allergen Reactions  . Codeine Nausea Only    Outpatient Encounter Prescriptions as of 05/19/2017  Medication Sig  . ALPRAZolam (XANAX) 0.5 MG tablet Take 0.5 mg by mouth at bedtime as needed for anxiety.  Marland Kitchen aspirin EC 81 MG tablet Take 1 tablet (81 mg total) by mouth daily.  . cloNIDine (CATAPRES) 0.2 MG tablet Take 0.2 mg by mouth 2 (two) times daily.  . clopidogrel (PLAVIX) 75 MG tablet Take 75 mg by mouth daily. @@ 9:00 pm  . colchicine 0.6 MG tablet Take 0.3 mg by mouth daily.   . furosemide (LASIX) 20 MG tablet Take 20 mg by mouth daily. Sunday, Tuesday, Thursday, Saturday  .  furosemide (LASIX) 40 MG tablet Take 40 mg by mouth daily. Mon,Wed,Fri  . isosorbide mononitrate (IMDUR) 30 MG 24 hr tablet Take 1 tablet (30 mg total) by mouth daily.  Marland Kitchen lisinopril (PRINIVIL,ZESTRIL) 40 MG tablet Take 40 mg by mouth daily. Reported on 11/22/2015  . metoprolol tartrate (LOPRESSOR) 25 MG tablet Take 12.5 mg by mouth 2 (two) times daily.  Marland Kitchen oxyCODONE (ROXICODONE) 15 MG immediate release tablet Take one tablet by mouth every 4 hours as needed for pain HOLD for sedation, resp depression  . pantoprazole (PROTONIX) 40 MG tablet Take 40 mg by mouth daily.  . potassium chloride SA (K-DUR,KLOR-CON) 20 MEQ tablet Take 20 mEq by mouth daily.   . simvastatin (ZOCOR) 40 MG tablet Take 40 mg by mouth daily.  Marland Kitchen umeclidinium bromide (INCRUSE ELLIPTA) 62.5 MCG/INH AEPB Inhale 1 puff into the lungs daily.  . [DISCONTINUED] augmented betamethasone dipropionate (DIPROLENE-AF) 0.05 % cream Apply 1 application topically 2 (two) times daily.  . [DISCONTINUED] guaiFENesin (MUCINEX) 600 MG 12 hr tablet Take 600 mg by mouth 2 (two) times daily as needed. Times 7  days  . [DISCONTINUED] ipratropium-albuterol (DUONEB) 0.5-2.5 (3) MG/3ML SOLN Take 3 mLs by nebulization every 6 (six) hours as needed. Give times 7 days   No facility-administered encounter medications on file as of 05/19/2017.      Review of Systems  Immunization History  Administered Date(s) Administered  . DTP 05/28/2001  . Influenza Split 04/28/2017  . Influenza-Unspecified 05/26/2013, 05/04/2014, 04/29/2016  . Pneumococcal-Unspecified 07/28/2009, 05/06/2016  . Tdap 12/30/2012   Pertinent  Health Maintenance Due  Topic Date Due  . COLONOSCOPY  11/24/2006  . INFLUENZA VACCINE  Completed   Fall Risk  04/06/2017 12/30/2012  Falls in the past year? No No   Functional Status Survey:    Vitals:   05/19/17 1240  BP: 138/75  Pulse: 85  Resp: 20  Temp: (!) 97 F (36.1 C)  TempSrc: Oral  SpO2: 94%  Weight: 225 lb 12.8 oz (102.4  kg)  Height: 5\' 8"  (1.727 m)   Body mass index is 34.33 kg/m. Physical Exam  Labs reviewed:  Recent Labs  02/24/17 0727 04/07/17 0700 05/05/17 0715  NA 135 134* 134*  K 4.2 4.3 4.0  CL 95* 92* 89*  CO2 31 33* 33*  GLUCOSE 108* 107* 136*  BUN 30* 34* 26*  CREATININE 1.43* 1.51* 1.68*  CALCIUM 8.9 9.4 8.9    Recent Labs  10/24/16 0212 02/24/17 0727 04/07/17 0700  AST 18 14* 15  ALT 13* 13* 12*  ALKPHOS 51 50 52  BILITOT 0.7 0.6 0.4  PROT 6.8 7.0 7.4  ALBUMIN 4.0 3.9 4.0    Recent Labs  12/16/16 0731 02/24/17 0727 04/07/17 0700  WBC 7.8 6.9 6.4  NEUTROABS 4.9 4.5 4.0  HGB 15.5 15.7 15.4  HCT 47.6 47.7 48.1  MCV 92.2 90.7 90.9  PLT 180 149* 161   Lab Results  Component Value Date   TSH 3.404 04/07/2017   Lab Results  Component Value Date   HGBA1C 5.6 10/24/2016   Lab Results  Component Value Date   CHOL 131 02/24/2017   HDL 30 (L) 02/24/2017   LDLCALC 53 02/24/2017   TRIG 241 (H) 02/24/2017   CHOLHDL 4.4 02/24/2017    Significant Diagnostic Results in last 30 days:  No results found.  Assessment/Plan There are no diagnoses linked to this encounter.   Family/ staff Communication:   Labs/tests ordered:       This encounter was created in error - please disregard.

## 2017-05-21 ENCOUNTER — Non-Acute Institutional Stay (SKILLED_NURSING_FACILITY): Payer: Medicare Other | Admitting: Internal Medicine

## 2017-05-21 ENCOUNTER — Encounter: Payer: Self-pay | Admitting: Internal Medicine

## 2017-05-21 DIAGNOSIS — G4700 Insomnia, unspecified: Secondary | ICD-10-CM

## 2017-05-21 DIAGNOSIS — I739 Peripheral vascular disease, unspecified: Secondary | ICD-10-CM

## 2017-05-21 DIAGNOSIS — I5032 Chronic diastolic (congestive) heart failure: Secondary | ICD-10-CM

## 2017-05-21 DIAGNOSIS — I10 Essential (primary) hypertension: Secondary | ICD-10-CM

## 2017-05-21 DIAGNOSIS — J42 Unspecified chronic bronchitis: Secondary | ICD-10-CM

## 2017-05-21 DIAGNOSIS — Z720 Tobacco use: Secondary | ICD-10-CM | POA: Diagnosis not present

## 2017-05-21 DIAGNOSIS — G8929 Other chronic pain: Secondary | ICD-10-CM

## 2017-05-21 DIAGNOSIS — E785 Hyperlipidemia, unspecified: Secondary | ICD-10-CM | POA: Diagnosis not present

## 2017-05-21 DIAGNOSIS — I2510 Atherosclerotic heart disease of native coronary artery without angina pectoris: Secondary | ICD-10-CM | POA: Diagnosis not present

## 2017-05-21 DIAGNOSIS — Z9861 Coronary angioplasty status: Secondary | ICD-10-CM

## 2017-05-21 NOTE — Progress Notes (Signed)
Location:   [facility] Room Number: 107/W Place of Service:  SNF [phone]) Provider:  Kyung Rudd, Rene Kocher, MD  Patient Care Team: [name], MD as PCP - General (Internal Medicine)  Extended Emergency Contact Information Primary Emergency Contact: Harvel Quale, [place] [address] [place] Mobile Phone: [phone] Relation: Daughter  Code Status:  Full Code Goals of care: Advanced Directive information Advanced Directives 05/21/2017  Does Patient Have a Medical Advance Directive? Yes  Type of Advance Directive (No Data)  Does patient want to make changes to medical advance directive? No - Patient declined  Copy of [place] in Chart? No - copy requested  Would patient like information on creating a medical advance directive? No - Patient declined  Pre-existing out of facility DNR order (yellow form or pink MOST form) -     Chief Complaint  Patient presents with  . Medical Management of Chronic Issues    Routine Visit    HPI:  Pt is a 61 y.o. male seen today for medical management of chronic diseases.    Patient has H/O Severe Peripheral Artery disease,S/P Right AKA. S/P Left femoral Endarterectomy 02/09/16 , Hypertension , CAD s/p PTCA, CHF, Hyperlipidemia, COPD, Continuous to smoke, Carotidstenosis. And Arthritis on Chronic Narcotics.  Patient continues to have Cough. He continues to go outside in the cold and smoke. His other complain inability to sleep at night. Otherwise he is doing. Well > his apetite is good and he has gained 5 more pounds since  July and is now up to 225 lbs. He says he has not made any changes to his diet as discussed. His legs have healed well and he follows with Vascular. He has Compression Stockings on.   Past Medical History:  Diagnosis Date  . Anemia, unspecified   . Arthritis    "shoulders" (09/22/2014)  . Asthma   . Atrial fibrillation ([place])   .  Carotid artery occlusion   . CHF (congestive heart failure) ([place])   . COPD 11/27/2007   Qualifier: Diagnosis of  By: Garen Grams    . Coronary artery disease    Cardiac catheterization in 2008 showed 99% stenosis in proximal left circumflex which was treated with Taxus drug-eluting stent. There was mild RCA and LAD disease with normal ejection fraction.  Marland Kitchen ERECTILE DYSFUNCTION 11/27/2007   Qualifier: Diagnosis of  By: Garen Grams    . Gastric ulcer, unspecified as acute or chronic, without mention of hemorrhage, perforation, or obstruction   . Heart murmur   . HYPERLIPIDEMIA 11/27/2007   Qualifier: Diagnosis of  By: Garen Grams    . Hypertension   . HYPERTENSION, BENIGN 05/23/2009   Qualifier: Diagnosis of  By: Melvyn Novas MD, Christena Deem   . Other severe protein-calorie malnutrition   . PAD (peripheral artery disease) ([facility])    Previous right SFA stent in 2003. Directional atherectomy right SFA in 01/2013  . Pneumonia 2000  . Pressure ulcer, lower back(707.03)   . Tobacco use   . Traumatic amputation of leg(s) (complete) (partial), unilateral, at or above knee, without mention of complication    Past Surgical History:  Procedure Laterality Date  . ABDOMINAL AORTAGRAM N/A 02/23/2013   Procedure: ABDOMINAL Maxcine Ham;  Surgeon: Wellington Hampshire, MD;  Location: [facility] CATH LAB;  Service: Cardiovascular;  Laterality: N/A;  . ABDOMINAL AORTAGRAM N/A 09/22/2014   Procedure: ABDOMINAL Maxcine Ham;  Surgeon: Elam Dutch,  MD;  Location: [facility] CATH LAB;  Service: Cardiovascular;  Laterality: N/A;  . ACNE CYST REMOVAL    . AMPUTATION Right 09/09/2013   Procedure: AMPUTATION ABOVE KNEE;  Surgeon: Rosetta Posner, MD;  Location: [facility];  Service: Vascular;  Laterality: Right;  . ATHERECTOMY N/A 03/02/2013   Procedure: ATHERECTOMY;  Surgeon: Wellington Hampshire, MD;  Location: [facility] CATH LAB;  Service: Cardiovascular;  Laterality: N/A;  . CARDIAC CATHETERIZATION N/A 09/03/2015   Procedure: Left Heart Cath and  Coronary Angiography;  Surgeon: Leonie Man, MD;  Location: [facility] CV LAB;  Service: Cardiovascular;  Laterality: N/A;  . CARDIAC CATHETERIZATION N/A 09/03/2015   Procedure: Coronary Balloon Angioplasty;  Surgeon: Leonie Man, MD;  Location: [facility] CV LAB;  Service: Cardiovascular;  Laterality: N/A;  . CORONARY ANGIOPLASTY  09/03/2015  . ENDARTERECTOMY Left 08/16/2013   Procedure: Exploration of Left Neck/Fix Bleeding;  Surgeon: Elam Dutch, MD;  Location: [facility];  Service: Vascular;  Laterality: Left;  . ENDARTERECTOMY FEMORAL Left 01/30/2016   Procedure: ENDARTERECTOMY LEFT FEMORAL ARTERY;  Surgeon: Elam Dutch, MD;  Location: [facility] OR;  Service: Vascular;  Laterality: Left;  . ESOPHAGOGASTRODUODENOSCOPY N/A 09/08/2013   Procedure: ESOPHAGOGASTRODUODENOSCOPY (EGD);  Surgeon: Cleotis Nipper, MD;  Location[facility] ENDOSCOPY;  Service: Endoscopy;  Laterality: N/A;  . ESOPHAGOGASTRODUODENOSCOPY (EGD) WITH PROPOFOL N/A 01/05/2014   Procedure: ESOPHAGOGASTRODUODENOSCOPY (EGD) WITH PROPOFOL;  Surgeon: Cleotis Nipper, MD;  Location: [facility] ENDOSCOPY;  Service: Endoscopy;  Laterality: N/A;  . FEMORAL ARTERY STENT Right 2003   [name] 09/22/2014  . FLEXIBLE SIGMOIDOSCOPY N/A 09/08/2013   Procedure: FLEXIBLE SIGMOIDOSCOPY;  Surgeon: Cleotis Nipper, MD;  Location: [facility] ENDOSCOPY;  Service: Endoscopy;  Laterality: N/A;  unprepp  . ILIAC ARTERY STENT Left 09/22/2014  . NASAL SINUS SURGERY  2004  . PAROTIDECTOMY Right 03/29/2014   Procedure: PAROTIDECTOMY;  Surgeon: Ascencion Dike, MD;  Location: [place] [facility];  Service: ENT;  Laterality: Right;  . PATCH ANGIOPLASTY Left 01/30/2016   Procedure: LEFT FEMORAL ARTYERY PATCH ANGIOPLASTY USING HEMASHIELD PLATINUM FINESSE PATCH;  Surgeon: Elam Dutch, MD;  Location: [place];  Service: Vascular;  Laterality: Left;  . PERIPHERAL VASCULAR CATHETERIZATION N/A 08/17/2015   Procedure: Abdominal Aortogram;  Surgeon: Elam Dutch, MD;  Location: [facility] CV LAB;   Service: Cardiovascular;  Laterality: N/A;    Allergies  Allergen Reactions  . Codeine Nausea Only    Outpatient Encounter Prescriptions as of 05/21/2017  Medication Sig  . ALPRAZolam (XANAX) 0.5 MG tablet Take 0.5 mg by mouth at bedtime as needed for anxiety.  Marland Kitchen aspirin EC 81 MG tablet Take 1 tablet (81 mg total) by mouth daily.  . cloNIDine (CATAPRES) 0.2 MG tablet Take 0.2 mg by mouth 2 (two) times daily.  . clopidogrel (PLAVIX) 75 MG tablet Take 75 mg by mouth daily. @@ 9:00 pm  . colchicine 0.6 MG tablet Take 0.3 mg by mouth daily.   . furosemide (LASIX) 20 MG tablet Take 20 mg by mouth daily. Sunday, Tuesday, Thursday, Saturday  . furosemide (LASIX) 40 MG tablet Take 40 mg by mouth daily. Mon,Wed,Fri  . isosorbide mononitrate (IMDUR) 30 MG 24 hr tablet Take 1 tablet (30 mg total) by mouth daily.  Marland Kitchen lisinopril (PRINIVIL,ZESTRIL) 40 MG tablet Take 40 mg by mouth daily. Reported on 11/22/2015  . metoprolol tartrate (LOPRESSOR) 25 MG tablet Take 12.5 mg by mouth 2 (two) times daily.  Marland Kitchen oxyCODONE (ROXICODONE) 15 MG immediate release tablet Take one tablet  by mouth every 4 hours as needed for pain HOLD for sedation, resp depression  . pantoprazole (PROTONIX) 40 MG tablet Take 40 mg by mouth daily.  . potassium chloride SA (K-DUR,KLOR-CON) 20 MEQ tablet Take 20 mEq by mouth daily.   . simvastatin (ZOCOR) 40 MG tablet Take 40 mg by mouth daily.  Marland Kitchen umeclidinium bromide (INCRUSE ELLIPTA) 62.5 MCG/INH AEPB Inhale 1 puff into the lungs daily.   No facility-administered encounter medications on file as of 05/21/2017.      Review of Systems  Review of Systems  Constitutional: Negative for activity change, appetite change, chills, diaphoresis, fatigue and fever.  HENT: Negative for mouth sores, postnasal drip, rhinorrhea, sinus pain and sore throat.   Respiratory: Negative for apnea, positive for Cough And SOB  Cardiovascular: Negative for chest pain, palpitations and leg swelling.    Gastrointestinal: Negative for abdominal distention, abdominal pain, constipation, diarrhea, nausea and vomiting.  Genitourinary: Negative for dysuria and frequency.  Musculoskeletal: Negative for arthralgias, joint swelling and myalgias.  Skin: Negative for rash.  Neurological: Negative for dizziness, syncope, weakness, light-headedness and numbness.  Psychiatric/Behavioral: Negative for behavioral problems, confusion and positive for sleep disturbance.     Immunization History  Administered Date(s) Administered  . DTP 05/28/2001  . Influenza Split 04/28/2017  . Influenza-Unspecified 05/26/2013, 05/04/2014, 04/29/2016  . Pneumococcal-Unspecified 07/28/2009, 05/06/2016  . Tdap 12/30/2012   Pertinent  Health Maintenance Due  Topic Date Due  . COLONOSCOPY  06/27/2017 (Originally 11/24/2006)  . INFLUENZA VACCINE  Completed   Fall Risk  04/06/2017 12/30/2012  Falls in the past year? No No   Functional Status Survey:    Vitals:   05/21/17 0833  BP: 138/75  Pulse: 85  Resp: 20  Temp: (!) 97 F (36.1 C)  TempSrc: Oral  SpO2: 94%  Weight: 225 lb 12.8 oz (102.4 kg)  Height: 5\' 8"  (1.727 m)   Body mass index is 34.33 kg/m. Physical Exam  Constitutional: He is oriented to person, place, and time. He appears well-developed and well-nourished.  HENT:  Head: Normocephalic.  Mouth/Throat: Oropharynx is clear and moist.  Eyes: Pupils are equal, round, and reactive to light.  Neck: Neck supple.  Cardiovascular: Normal rate and normal heart sounds.   Pulmonary/Chest: Effort normal.  Continues to  have Bilateral Expiratory wheezing.  Abdominal: Soft. Bowel sounds are normal. He exhibits no distension. There is no tenderness. There is no rebound.  Musculoskeletal: He exhibits no edema.  Neurological: He is alert and oriented to person, place, and time.  Skin: Skin is warm and dry.  Psychiatric: He has a normal mood and affect. His behavior is normal.    Labs reviewed:  Recent  Labs  02/24/17 0727 04/07/17 0700 05/05/17 0715  NA 135 134* 134*  K 4.2 4.3 4.0  CL 95* 92* 89*  CO2 31 33* 33*  GLUCOSE 108* 107* 136*  BUN 30* 34* 26*  CREATININE 1.43* 1.51* 1.68*  CALCIUM 8.9 9.4 8.9    Recent Labs  10/24/16 0212 02/24/17 0727 04/07/17 0700  AST 18 14* 15  ALT 13* 13* 12*  ALKPHOS 51 50 52  BILITOT 0.7 0.6 0.4  PROT 6.8 7.0 7.4  ALBUMIN 4.0 3.9 4.0    Recent Labs  12/16/16 0731 02/24/17 0727 04/07/17 0700  WBC 7.8 6.9 6.4  NEUTROABS 4.9 4.5 4.0  HGB 15.5 15.7 15.4  HCT 47.6 47.7 48.1  MCV 92.2 90.7 90.9  PLT 180 149* 161   Lab Results  Component Value Date  TSH 3.404 04/07/2017   Lab Results  Component Value Date   HGBA1C 5.6 10/24/2016   Lab Results  Component Value Date   CHOL 131 02/24/2017   HDL 30 (L) 02/24/2017   LDLCALC 53 02/24/2017   TRIG 241 (H) 02/24/2017   CHOLHDL 4.4 02/24/2017    Significant Diagnostic Results in last 30 days:  No results found.  Assessment/Plan  COPD. He was recently treated with Steroids and Antibiotics But his continues smoking outside in this weather is not helping him. We discussed again but patient is not interested in quitting. Will start him on Symbicort. Already on Incruse Ellipta. Also  POX QD. Cannot start Oxygen as he smokes every few hours. Continue Duo Nebs  PAD (peripheral artery disease)  Continues on Aspirin and Plavix Also on statin Follow with Vascular.  Chronic diastolic congestive heart failure  Echo 55% EF in 2015 Stable on Lasix Weight Gain seem to be related to Eating more.  CAD S/P percutaneous coronary angioplasty On Aspirin and statin ANd Beta blocker and Imdur  Essential hypertension BP Controlled on Clondine, Lasix, Lisinopril and Meotoprool   chronic pain Due to arthritis also Narcotic abuse On Chronic  Oxycodone Chronic Kidney disease.Stage 3 Creat stable at 1.68   Hyperlipidemia with target LDL less than 70 LDL 53 in 07/18  Tobacco  use Has refused to quit  Insomnia Unfortunately cannot use lot of meds as not covered. Will increase his Xanax to 7.5 mg prn  Gout On colchicine stable  Family/ staff Communication:   Labs/tests ordered:    Total time spent in this patient care encounter was 45_ minutes; greater than 50% of the visit spent counseling patient, reviewing records , Labs and coordinating care for problems addressed at this encounter.

## 2017-06-09 ENCOUNTER — Encounter (HOSPITAL_COMMUNITY): Payer: Self-pay

## 2017-06-09 ENCOUNTER — Ambulatory Visit: Payer: Self-pay | Admitting: Family

## 2017-06-17 ENCOUNTER — Other Ambulatory Visit: Payer: Self-pay

## 2017-06-17 MED ORDER — OXYCODONE HCL 15 MG PO TABS: ORAL_TABLET | ORAL | 0 refills | Status: DC

## 2017-06-17 NOTE — Telephone Encounter (Signed)
RX Fax for [facility]@ [phone]  

## 2017-06-30 ENCOUNTER — Other Ambulatory Visit: Payer: Self-pay

## 2017-06-30 DIAGNOSIS — I739 Peripheral vascular disease, unspecified: Secondary | ICD-10-CM

## 2017-07-03 ENCOUNTER — Encounter: Payer: Self-pay | Admitting: Internal Medicine

## 2017-07-03 ENCOUNTER — Non-Acute Institutional Stay (SKILLED_NURSING_FACILITY): Payer: Medicare Other | Admitting: Internal Medicine

## 2017-07-03 DIAGNOSIS — M19011 Primary osteoarthritis, right shoulder: Secondary | ICD-10-CM | POA: Diagnosis not present

## 2017-07-03 DIAGNOSIS — J42 Unspecified chronic bronchitis: Secondary | ICD-10-CM

## 2017-07-03 DIAGNOSIS — N289 Disorder of kidney and ureter, unspecified: Secondary | ICD-10-CM

## 2017-07-03 DIAGNOSIS — E785 Hyperlipidemia, unspecified: Secondary | ICD-10-CM

## 2017-07-03 DIAGNOSIS — I739 Peripheral vascular disease, unspecified: Secondary | ICD-10-CM | POA: Diagnosis not present

## 2017-07-03 DIAGNOSIS — I5032 Chronic diastolic (congestive) heart failure: Secondary | ICD-10-CM

## 2017-07-03 NOTE — Progress Notes (Signed)
Location:   [place] Room Number: 107/W Place of Service:  SNF [phone]) Provider:  Gardiner Fanti, Rene Kocher, MD  Patient Care Team: [name], MD as PCP - General (Internal Medicine)  Extended Emergency Contact Information Primary Emergency Contact: [place], [place] [address] [place] Mobile Phone: [phone] Relation: Daughter  Code Status:  Full Code Goals of care: Advanced Directive information Advanced Directives 05/21/2017  Does Patient Have a Medical Advance Directive? Yes  Type of Advance Directive (No Data)  Does patient want to make changes to medical advance directive? No - Patient declined  Copy of [place] in Chart? No - copy requested  Would patient like information on creating a medical advance directive? No - Patient declined  Pre-existing out of facility DNR order (yellow form or pink MOST form) -  Chief complaint---- acute visit follow-up I chronic medical conditions including arterial disease-COPD-coronary artery disease-CHF- osteoarthritic pain- hypertension-hyperlipidemia-chronic kidney disease    HPI:  Pt is a 61 y.o. male seen today for medical management of chronic diseases.  As noted above.  Patient continues to be stable he has no acute complaints today.  He does continue to complain of some inability to sleep at night does receive Xanax at at bedtime and also later in the evening if he can still not get to sleep we have but hesitate to increase this secondary to sedation concerns currently has 0.5 mg of Xanax at bedtime as needed and if still awake at 2 AM s can have an additional 0.25 mg at 2 AM  He also continues on narcotics for history of osteoarthritis he does receive oxycodone 5 mg every 4 hours.  He does have a history of peripheral arterial disease he does have aortic iliac Doppler scheduled for later this month he is status post right above-the-knee  amputation status post left femoral endarterectomy back in July 2017  He does continue on Plavix as well as aspirin.  His weight is stable at 225 pounds he does have a history of CHF is on Lasix-this is complicated with a history of chronic kidney disease but creatinine has been relatively stable around 1.6 recently we will update this.  Regards her hypertension this appears to be well controlled on numerous medications including lisinopril Lopressor Lasix and clonidine systolics appear to be mainly in the 120s-140s-with occasional spikes higher but these are transitory.  Patient does have a history of COPD continues on Incruse Ellipta as well as Symbicort-he continues to smoke has consistently expressed desire to continue this despite the risks.  .       Past Medical History:  Diagnosis Date  . Anemia, unspecified   . Arthritis    "shoulders" (09/22/2014)  . Asthma   . Atrial fibrillation ([place])   . Carotid artery occlusion   . CHF (congestive heart failure) ([place])   . COPD 11/27/2007   Qualifier: Diagnosis of  By: Garen Grams    . Coronary artery disease    Cardiac catheterization in 2008 showed 99% stenosis in proximal left circumflex which was treated with Taxus drug-eluting stent. There was mild RCA and LAD disease with normal ejection fraction.  Marland Kitchen ERECTILE DYSFUNCTION 11/27/2007   Qualifier: Diagnosis of  By: Garen Grams    . Gastric ulcer, unspecified as acute or chronic, without mention of hemorrhage, perforation, or obstruction   . Heart murmur   . HYPERLIPIDEMIA 11/27/2007  Qualifier: Diagnosis of  By: Garen Grams    . Hypertension   . HYPERTENSION, BENIGN 05/23/2009   Qualifier: Diagnosis of  By: Melvyn Novas MD, Christena Deem   . Other severe protein-calorie malnutrition   . PAD (peripheral artery disease) ([facility])    Previous right SFA stent in 2003. Directional atherectomy right SFA in 01/2013  . Pneumonia 2000  . Pressure ulcer, lower back(707.03)   . Tobacco use     . Traumatic amputation of leg(s) (complete) (partial), unilateral, at or above knee, without mention of complication    Past Surgical History:  Procedure Laterality Date  . ABDOMINAL AORTAGRAM N/A 02/23/2013   Procedure: ABDOMINAL Maxcine Ham;  Surgeon: [place] Hampshire, MD;  Location: [facility] CATH LAB;  Service: Cardiovascular;  Laterality: N/A;  . ABDOMINAL AORTAGRAM N/A 09/22/2014   Procedure: ABDOMINAL Maxcine Ham;  Surgeon: Elam Dutch, MD;  Location: [facility] CATH LAB;  Service: Cardiovascular;  Laterality: N/A;  . ACNE CYST REMOVAL    . AMPUTATION Right 09/09/2013   Procedure: AMPUTATION ABOVE KNEE;  Surgeon: Rosetta Posner, MD;  Location: [place];  Service: Vascular;  Laterality: Right;  . ATHERECTOMY N/A 03/02/2013   Procedure: ATHERECTOMY;  Surgeon: [place] Hampshire, MD;  Location: [facility] CATH LAB;  Service: Cardiovascular;  Laterality: N/A;  . CARDIAC CATHETERIZATION N/A 09/03/2015   Procedure: Left Heart Cath and Coronary Angiography;  Surgeon: Leonie Man, MD;  Location: [place] CV LAB;  Service: Cardiovascular;  Laterality: N/A;  . CARDIAC CATHETERIZATION N/A 09/03/2015   Procedure: Coronary Balloon Angioplasty;  Surgeon: Leonie Man, MD;  Location: [place] CV LAB;  Service: Cardiovascular;  Laterality: N/A;  . CORONARY ANGIOPLASTY  09/03/2015  . ENDARTERECTOMY Left 08/16/2013   Procedure: Exploration of Left Neck/Fix Bleeding;  Surgeon: Elam Dutch, MD;  Location: [facility];  Service: Vascular;  Laterality: Left;  . ENDARTERECTOMY FEMORAL Left 01/30/2016   Procedure: ENDARTERECTOMY LEFT FEMORAL ARTERY;  Surgeon: Elam Dutch, MD;  Location: [facility] OR;  Service: Vascular;  Laterality: Left;  . ESOPHAGOGASTRODUODENOSCOPY N/A 09/08/2013   Procedure: ESOPHAGOGASTRODUODENOSCOPY (EGD);  Surgeon: Cleotis Nipper, MD;  Location: [facility] ENDOSCOPY;  Service: Endoscopy;  Laterality: N/A;  . ESOPHAGOGASTRODUODENOSCOPY (EGD) WITH PROPOFOL N/A 01/05/2014   Procedure: ESOPHAGOGASTRODUODENOSCOPY (EGD) WITH  PROPOFOL;  Surgeon: Cleotis Nipper, MD;  Location: [facility] ENDOSCOPY;  Service: Endoscopy;  Laterality: N/A;  . FEMORAL ARTERY STENT Right 2003   [name] 09/22/2014  . FLEXIBLE SIGMOIDOSCOPY N/A 09/08/2013   Procedure: FLEXIBLE SIGMOIDOSCOPY;  Surgeon: Cleotis Nipper, MD;  Location[facility] ENDOSCOPY;  Service: Endoscopy;  Laterality: N/A;  unprepp  . ILIAC ARTERY STENT Left 09/22/2014  . NASAL SINUS SURGERY  2004  . PAROTIDECTOMY Right 03/29/2014   Procedure: PAROTIDECTOMY;  Surgeon: Ascencion Dike, MD;  Location: [place];  Service: ENT;  Laterality: Right;  . PATCH ANGIOPLASTY Left 01/30/2016   Procedure: LEFT FEMORAL ARTYERY PATCH ANGIOPLASTY USING HEMASHIELD PLATINUM FINESSE PATCH;  Surgeon: Elam Dutch, MD;  Location: [place];  Service: Vascular;  Laterality: Left;  . PERIPHERAL VASCULAR CATHETERIZATION N/A 08/17/2015   Procedure: Abdominal Aortogram;  Surgeon: Elam Dutch, MD;  Location: [place] CV LAB;  Service: Cardiovascular;  Laterality: N/A;    Allergies  Allergen Reactions  . Codeine Nausea Only    Outpatient Encounter Medications as of 07/03/2017  Medication Sig  . ALPRAZolam (XANAX) 0.25 MG tablet Take 0.25 mg by mouth as needed for anxiety. From 06/22/2017-09/22/2016  . ALPRAZolam (XANAX) 0.5 MG tablet Take 0.5  mg by mouth at bedtime as needed for anxiety.  Marland Kitchen aspirin EC 81 MG tablet Take 1 tablet (81 mg total) by mouth daily.  . budesonide-formoterol (SYMBICORT) 80-4.5 MCG/ACT inhaler Inhale 2 puffs into the lungs 2 (two) times daily.  . cloNIDine (CATAPRES) 0.2 MG tablet Take 0.2 mg by mouth 2 (two) times daily.  . clopidogrel (PLAVIX) 75 MG tablet Take 75 mg by mouth daily. @@ 9:00 pm  . colchicine 0.6 MG tablet Take 0.3 mg by mouth daily.   . furosemide (LASIX) 20 MG tablet Take 20 mg by mouth daily. Sunday, Tuesday, Thursday, Saturday  . furosemide (LASIX) 40 MG tablet Take 40 mg by mouth daily. Mon,Wed,Fri  . isosorbide mononitrate (IMDUR) 30 MG 24 hr tablet Take 1 tablet (30  mg total) by mouth daily.  Marland Kitchen lisinopril (PRINIVIL,ZESTRIL) 40 MG tablet Take 40 mg by mouth daily. Reported on 11/22/2015  . metoprolol tartrate (LOPRESSOR) 25 MG tablet Take 12.5 mg by mouth 2 (two) times daily.  Marland Kitchen oxyCODONE (ROXICODONE) 15 MG immediate release tablet Take one tablet by mouth every 4 hours as needed for pain HOLD for sedation, resp depression  . pantoprazole (PROTONIX) 40 MG tablet Take 40 mg by mouth daily.  . potassium chloride SA (K-DUR,KLOR-CON) 20 MEQ tablet Take 20 mEq by mouth daily.   . simvastatin (ZOCOR) 40 MG tablet Take 40 mg by mouth daily.  Marland Kitchen umeclidinium bromide (INCRUSE ELLIPTA) 62.5 MCG/INH AEPB Inhale 1 puff into the lungs daily.  . [DISCONTINUED] ALPRAZolam (XANAX) 0.5 MG tablet Take 0.5 mg by mouth at bedtime as needed for anxiety.   No facility-administered encounter medications on file as of 07/03/2017.      Review of Systems  Is is is in general is not complaining of any fever or chills weight is been stable.  Skin does not complain of rashes or itching does have some history of blisters on his left leg which appears to have largely resolved.  Head ears eyes nose mouth and throat is not complaining of any sore throat or visual changes.  Respiratory does not complain of shortness of breath does have somewhat of a chronic cough with COPD he does continue to smoke.  Cardiac does not complain of any chest pain or palpitations has some mild lower extremity edema but compression device appears to help.  She is not complaining of dysuria.  Musculoskeletal continues to complain of osteoarthritic pain mainly of his shoulders- does take oxycodone regularly and this apparently is effective.  Neurologic does not complain of dizziness headache or syncope.  Psych does not complain of overt anxiety or depression continues to be pleasant.    Immunization History  Administered Date(s) Administered  . DTP 05/28/2001  . Influenza Split 04/28/2017  .  Influenza-Unspecified 05/26/2013, 05/04/2014, 04/29/2016  . Pneumococcal-Unspecified 07/28/2009, 05/06/2016  . Tdap 12/30/2012   Pertinent  Health Maintenance Due  Topic Date Due  . COLONOSCOPY  08/03/2017 (Originally 11/24/2006)  . INFLUENZA VACCINE  Completed   Fall Risk  04/06/2017 12/30/2012  Falls in the past year? No No   Functional Status Survey:    Vitals:   07/03/17 1449  BP: 130/76  Pulse: 70  Resp: 19  Temp: 98.6 F (37 C)  TempSrc: Oral  SpO2: 98%  Weight: 225 lb 3.2 oz (102.2 kg)  Height: 5\' 8"  (1.727 m)   Body mass index is 34.24 kg/m. Physical Exam   In general this is a pleasant middle-age male in no distress sitting comfortably in his  wheelchair.  His skin is warm and dry.  Eyes sclera and conjunctive are clear visual acuity appears grossly intact.  Oropharynx is clear mucous membranes moist.  Chest he does have shallow air entry and some baseline expiratory wheezing which is baseline this actually appears to be improved however from previous exam.  Heart is regular rate and rhythm without murmur gallop or rub he has quite mild lower extremity edema on the left he has compression device on.  Abdomen is soft nontender with positive bowel sounds.  Musculoskeletal is status post right above-the-knee amputation ambulates very well in his wheelchair strength appears to be intact every other extremities.  Neurologic is grossly intact his speech is clear no lateralizing findings.  Psych he is alert and oriented pleasant and appropriate.    Labs reviewed: Recent Labs    02/24/17 0727 04/07/17 0700 05/05/17 0715  NA 135 134* 134*  K 4.2 4.3 4.0  CL 95* 92* 89*  CO2 31 33* 33*  GLUCOSE 108* 107* 136*  BUN 30* 34* 26*  CREATININE 1.43* 1.51* 1.68*  CALCIUM 8.9 9.4 8.9   Recent Labs    10/24/16 0212 02/24/17 0727 04/07/17 0700  AST 18 14* 15  ALT 13* 13* 12*  ALKPHOS 51 50 52  BILITOT 0.7 0.6 0.4  PROT 6.8 7.0 7.4  ALBUMIN 4.0 3.9 4.0    Recent Labs    12/16/16 0731 02/24/17 0727 04/07/17 0700  WBC 7.8 6.9 6.4  NEUTROABS 4.9 4.5 4.0  HGB 15.5 15.7 15.4  HCT 47.6 47.7 48.1  MCV 92.2 90.7 90.9  PLT 180 149* 161   Lab Results  Component Value Date   TSH 3.404 04/07/2017   Lab Results  Component Value Date   HGBA1C 5.6 10/24/2016   Lab Results  Component Value Date   CHOL 131 02/24/2017   HDL 30 (L) 02/24/2017   LDLCALC 53 02/24/2017   TRIG 241 (H) 02/24/2017   CHOLHDL 4.4 02/24/2017    Significant Diagnostic Results in last 30 days:  No results found.  Assessment/Plan  #1-history of peripheral arterial disease again he does have an aortic iliac Doppler scheduled for later this month he is followed closely by vascular he is status post right AKA-continues on Plavix as well as aspirin and a statin.  2.-History COPD- patient still continues to smoke continues on Incruse Ellipta and Symbicort this appears relatively stable he appears to be benefiting from his medications although obviously quitting smoking would be of great benefit as well patient has expressed desire to continue to smoke.  3.-History of congestive heart failure continues on Lasix weight has been stable edema appears to be stabilized as well he appears to be relatively asymptomatic.  4.  History of hypertension this appears relatively stable with occasional systolic spikes he is on numerous medications including lisinopril Lopressor Lasix clonidine   #5  History of  osteoarthritis continues on oxycodone routinely at this point will monitor--he says he is benefiting from the current medications.  6.  History of insomnia this is been somewhat of a challenging issue we have been trying to minimize Xanax use he is on 0.5 nightly as needed and can receive an additional 0.25 mg of Xanax at 2 AM if still awake.  7.-History of chronic kidney disease this appears stable with a creatinine of 1.68 on lab done in October will update this especially  since he is on Lasix.  8.-History of hyperlipidemia continues on a statin LDL was 53--on recent lab July  2018.  9. History of partial parathyroidectomy- apparently biopsy did show clear margins- patient was scheduled for follow-up but has deferred this.  #10-history of carotid stenosis again continues on Plavix and aspirin he is followed by vascular   Again will update a metabolic panel keep an eye on his renal function also a CBC for updated values  Again we will update a BMP to keep an eye on his renal function as well as a CBC secondary to high risk meds   [phone]

## 2017-07-05 ENCOUNTER — Encounter: Payer: Self-pay | Admitting: Internal Medicine

## 2017-07-07 ENCOUNTER — Encounter (HOSPITAL_COMMUNITY)
Admission: RE | Admit: 2017-07-07 | Discharge: 2017-07-07 | Disposition: A | Discharge status: Home / Self Care | Payer: Medicare Other | Source: Skilled Nursing Facility | Attending: Internal Medicine | Admitting: Internal Medicine

## 2017-07-07 DIAGNOSIS — D649 Anemia, unspecified: Secondary | ICD-10-CM | POA: Diagnosis not present

## 2017-07-07 DIAGNOSIS — K259 Gastric ulcer, unspecified as acute or chronic, without hemorrhage or perforation: Secondary | ICD-10-CM | POA: Insufficient documentation

## 2017-07-07 DIAGNOSIS — Z89511 Acquired absence of right leg below knee: Secondary | ICD-10-CM | POA: Diagnosis not present

## 2017-07-07 DIAGNOSIS — J449 Chronic obstructive pulmonary disease, unspecified: Secondary | ICD-10-CM | POA: Diagnosis not present

## 2017-07-07 DIAGNOSIS — G4700 Insomnia, unspecified: Secondary | ICD-10-CM | POA: Diagnosis not present

## 2017-07-07 DIAGNOSIS — I739 Peripheral vascular disease, unspecified: Secondary | ICD-10-CM | POA: Diagnosis not present

## 2017-07-07 LAB — BASIC METABOLIC PANEL
Anion gap: 9 (ref 5–15)
BUN: 28 mg/dL — AB (ref 6–20)
CHLORIDE: 93 mmol/L — AB (ref 101–111)
CO2: 34 mmol/L — ABNORMAL HIGH (ref 22–32)
CREATININE: 1.54 mg/dL — AB (ref 0.61–1.24)
Calcium: 9.1 mg/dL (ref 8.9–10.3)
GFR calc Af Amer: 55 mL/min — ABNORMAL LOW (ref >60)
GFR calc non Af Amer: 47 mL/min — ABNORMAL LOW (ref >60)
GLUCOSE: 103 mg/dL — AB (ref 65–99)
POTASSIUM: 3.9 mmol/L (ref 3.5–5.1)
SODIUM: 136 mmol/L (ref 135–145)

## 2017-07-07 LAB — CBC WITH DIFFERENTIAL/PLATELET
Basophils Absolute: 0.1 10*3/uL (ref 0.0–0.1)
Basophils Relative: 1 %
EOS ABS: 0.2 10*3/uL (ref 0.0–0.7)
EOS PCT: 3 %
HCT: 46.2 % (ref 39.0–52.0)
HEMOGLOBIN: 13.7 g/dL (ref 13.0–17.0)
LYMPHS ABS: 1.6 10*3/uL (ref 0.7–4.0)
LYMPHS PCT: 23 %
MCH: 24.4 pg — AB (ref 26.0–34.0)
MCHC: 29.7 g/dL — AB (ref 30.0–36.0)
MCV: 82.2 fL (ref 78.0–100.0)
MONOS PCT: 9 %
Monocytes Absolute: 0.7 10*3/uL (ref 0.1–1.0)
NEUTROS PCT: 64 %
Neutro Abs: 4.6 10*3/uL (ref 1.7–7.7)
Platelets: 211 10*3/uL (ref 150–400)
RBC: 5.62 MIL/uL (ref 4.22–5.81)
RDW: 19.7 % — ABNORMAL HIGH (ref 11.5–15.5)
WBC: 7.2 10*3/uL (ref 4.0–10.5)

## 2017-07-15 ENCOUNTER — Other Ambulatory Visit: Payer: Self-pay

## 2017-07-15 MED ORDER — OXYCODONE HCL 15 MG PO TABS: ORAL_TABLET | ORAL | 0 refills | Status: DC

## 2017-07-23 ENCOUNTER — Inpatient Hospital Stay (HOSPITAL_COMMUNITY)
Admit: 2017-07-23 | Discharge: 2017-07-23 | Disposition: A | Discharge status: Home / Self Care | Payer: Medicare Other | Attending: Family | Admitting: Family

## 2017-07-23 ENCOUNTER — Ambulatory Visit: Payer: Self-pay | Admitting: Family

## 2017-07-23 ENCOUNTER — Inpatient Hospital Stay (HOSPITAL_COMMUNITY)
Admit: 2017-07-23 | Discharge: 2017-07-23 | Disposition: A | Discharge status: Home / Self Care | Payer: Medicare Other | Attending: Vascular Surgery | Admitting: Vascular Surgery

## 2017-07-23 DIAGNOSIS — I6523 Occlusion and stenosis of bilateral carotid arteries: Secondary | ICD-10-CM

## 2017-07-23 DIAGNOSIS — I739 Peripheral vascular disease, unspecified: Secondary | ICD-10-CM

## 2017-07-23 DIAGNOSIS — Z95828 Presence of other vascular implants and grafts: Secondary | ICD-10-CM

## 2017-07-23 LAB — VAS US CAROTID
LCCADSYS: 62 cm/s
LEFT ECA DIAS: -76 cm/s
LEFT VERTEBRAL DIAS: 20 cm/s
LICADSYS: -185 cm/s
LICAPDIAS: -71 cm/s
LICAPSYS: -182 cm/s
Left CCA dist dias: 19 cm/s
Left CCA prox dias: 22 cm/s
Left CCA prox sys: 86 cm/s
Left ICA dist dias: -63 cm/s
RCCAPDIAS: 17 cm/s
RIGHT CCA MID DIAS: 16 cm/s
RIGHT ECA DIAS: -9 cm/s
RIGHT VERTEBRAL DIAS: 15 cm/s
Right CCA prox sys: 101 cm/s
Right cca dist sys: -119 cm/s

## 2017-07-27 ENCOUNTER — Other Ambulatory Visit: Payer: Self-pay

## 2017-07-27 MED ORDER — ALPRAZOLAM 0.25 MG PO TABS: 0.2500 mg | ORAL_TABLET | ORAL | 0 refills | Status: DC | PRN

## 2017-07-27 NOTE — Telephone Encounter (Signed)
RX Fax for [facility]@ [phone]  

## 2017-08-11 ENCOUNTER — Encounter: Payer: Self-pay | Admitting: Family

## 2017-08-11 ENCOUNTER — Ambulatory Visit (INDEPENDENT_AMBULATORY_CARE_PROVIDER_SITE_OTHER): Payer: Medicare Other | Admitting: Family

## 2017-08-11 VITALS — BP 144/77 | HR 73 | Temp 98.0°F | Resp 20 | Ht 68.0 in | Wt 225.0 lb

## 2017-08-11 DIAGNOSIS — I6523 Occlusion and stenosis of bilateral carotid arteries: Secondary | ICD-10-CM

## 2017-08-11 DIAGNOSIS — I83022 Varicose veins of left lower extremity with ulcer of calf: Secondary | ICD-10-CM

## 2017-08-11 DIAGNOSIS — Z89611 Acquired absence of right leg above knee: Secondary | ICD-10-CM | POA: Diagnosis not present

## 2017-08-11 DIAGNOSIS — Z95828 Presence of other vascular implants and grafts: Secondary | ICD-10-CM | POA: Diagnosis not present

## 2017-08-11 DIAGNOSIS — L97221 Non-pressure chronic ulcer of left calf limited to breakdown of skin: Secondary | ICD-10-CM

## 2017-08-11 DIAGNOSIS — Z9862 Peripheral vascular angioplasty status: Secondary | ICD-10-CM | POA: Diagnosis not present

## 2017-08-11 DIAGNOSIS — F17200 Nicotine dependence, unspecified, uncomplicated: Secondary | ICD-10-CM | POA: Diagnosis not present

## 2017-08-11 DIAGNOSIS — I779 Disorder of arteries and arterioles, unspecified: Secondary | ICD-10-CM | POA: Diagnosis not present

## 2017-08-11 NOTE — Patient Instructions (Addendum)
Steps to Quit Smoking Smoking tobacco can be bad for your health. It can also affect almost every organ in your body. Smoking puts you and people around you at risk for many serious long-lasting (chronic) diseases. Quitting smoking is hard, but it is one of the best things that you can do for your health. It is never too late to quit. What are the benefits of quitting smoking? When you quit smoking, you lower your risk for getting serious diseases and conditions. They can include:  Lung cancer or lung disease.  Heart disease.  Stroke.  Heart attack.  Not being able to have children (infertility).  Weak bones (osteoporosis) and broken bones (fractures).  If you have coughing, wheezing, and shortness of breath, those symptoms may get better when you quit. You may also get sick less often. If you are pregnant, quitting smoking can help to lower your chances of having a baby of low birth weight. What can I do to help me quit smoking? Talk with your doctor about what can help you quit smoking. Some things you can do (strategies) include:  Quitting smoking totally, instead of slowly cutting back how much you smoke over a period of time.  Going to in-person counseling. You are more likely to quit if you go to many counseling sessions.  Using resources and support systems, such as: ? Online chats with a counselor. ? Phone quitlines. ? Printed self-help materials. ? Support groups or group counseling. ? Text messaging programs. ? Mobile phone apps or applications.  Taking medicines. Some of these medicines may have nicotine in them. If you are pregnant or breastfeeding, do not take any medicines to quit smoking unless your doctor says it is okay. Talk with your doctor about counseling or other things that can help you.  Talk with your doctor about using more than one strategy at the same time, such as taking medicines while you are also going to in-person counseling. This can help make  quitting easier. What things can I do to make it easier to quit? Quitting smoking might feel very hard at first, but there is a lot that you can do to make it easier. Take these steps:  Talk to your family and friends. Ask them to support and encourage you.  Call phone quitlines, reach out to support groups, or work with a counselor.  Ask people who smoke to not smoke around you.  Avoid places that make you want (trigger) to smoke, such as: ? Bars. ? Parties. ? Smoke-break areas at work.  Spend time with people who do not smoke.  Lower the stress in your life. Stress can make you want to smoke. Try these things to help your stress: ? Getting regular exercise. ? Deep-breathing exercises. ? Yoga. ? Meditating. ? Doing a body scan. To do this, close your eyes, focus on one area of your body at a time from head to toe, and notice which parts of your body are tense. Try to relax the muscles in those areas.  Download or buy apps on your mobile phone or tablet that can help you stick to your quit plan. There are many free apps, such as QuitGuide from the CDC (Centers for Disease Control and Prevention). You can find more support from smokefree.gov and other websites.  This information is not intended to replace advice given to you by your health care provider. Make sure you discuss any questions you have with your health care provider. Document Released: 05/10/2009 Document   Revised: 03/11/2016 Document Reviewed: 11/28/2014 Elsevier Interactive Patient Education  2018 [employer].     Venous Ulcer A venous ulcer is a shallow sore on your lower leg. It is caused by poor circulation in your veins. Venous ulcer is the most common type of lower leg ulcer. You may have venous ulcers on one leg or on both legs. This condition most often develops around your ankles. This type of ulcer may last for a long time (chronic ulcer) or it may return often (recurrent ulcer). Follow these instructions at  home: Wound care  Follow instructions from your doctor about: ? How to take care of your wound. ? When and how you should change your bandage (dressing). ? When you should remove your bandage. If your bandage is dry and gets stuck to your leg when you try to remove it, moisten or wet the bandage with saline solution or water. This helps you to remove it without harming your skin or wound.  Check your wound every day for signs of infection. Have a caregiver do this for you if you are not able to do it yourself. Watch for: ? More redness, swelling, or pain. ? More fluid or blood. ? Pus, warmth, or a bad smell. Medicines  Take over-the-counter and prescription medicines only as told by your doctor.  If you were prescribed an antibiotic medicine, take it or apply it as told by your doctor. Do not stop taking or using the antibiotic even if your condition improves. Activity  Do not stand or sit in one position for a long period of time. Rest with your legs raised during the day. If possible, keep your legs above your heart for 30 minutes, 3-4 times a day, or as told by your doctor.  Do not sit with your legs crossed.  Walk often to increase the blood flow in your legs. Ask your doctor what level of activity is safe for you.  If you are taking a long ride in a car or plane, take a break to walk around at least once every two hours, or as told by your doctor. Ask your doctor if you should take aspirin before long trips. General instructions   Wear elastic stockings, compression stockings, or support hose as told by your doctor. This is very important.  Raise the foot of your bed as told by your doctor.  Do not smoke.  Keep all follow-up visits as told by your doctor. This is important. Contact a doctor if:  You have a fever.  Your ulcer is getting larger or is not healing.  Your pain gets worse.  You have more redness or swelling around your ulcer.  You have more fluid, blood,  or pus coming from your ulcer after it has been cleaned by you or your doctor.  You have warmth or a bad smell coming from your ulcer. This information is not intended to replace advice given to you by your health care provider. Make sure you discuss any questions you have with your health care provider. Document Released: 08/21/2004 Document Revised: 12/20/2015 Document Reviewed: 11/22/2014 Elsevier Interactive Patient Education  2018 [employer].    To decrease swelling in your foot and leg: Elevate feet above slightly bent knees, feet above heart, overnight and 3-4 times per day for 20 minutes.      Peripheral Vascular Disease Peripheral vascular disease (PVD) is a disease of the blood vessels that are not part of your heart and brain. A simple term  for PVD is poor circulation. In most cases, PVD narrows the blood vessels that carry blood from your heart to the rest of your body. This can result in a decreased supply of blood to your arms, legs, and internal organs, like your stomach or kidneys. However, it most often affects a person's lower legs and feet. There are two types of PVD.  Organic PVD. This is the more common type. It is caused by damage to the structure of blood vessels.  Functional PVD. This is caused by conditions that make blood vessels contract and tighten (spasm).  Without treatment, PVD tends to get worse over time. PVD can also lead to acute ischemic limb. This is when an arm or limb suddenly has trouble getting enough blood. This is a medical emergency. Follow these instructions at home:  Take medicines only as told by your doctor.  Do not use any tobacco products, including cigarettes, chewing tobacco, or electronic cigarettes. If you need help quitting, ask your doctor.  Lose weight if you are overweight, and maintain a healthy weight as told by your doctor.  Eat a diet that is low in fat and cholesterol. If you need help, ask your doctor.  Exercise  regularly. Ask your doctor for some good activities for you.  Take good care of your feet. ? Wear comfortable shoes that fit well. ? Check your feet often for any cuts or sores. Contact a doctor if:  You have cramps in your legs while walking.  You have leg pain when you are at rest.  You have coldness in a leg or foot.  Your skin changes.  You are unable to get or have an erection (erectile dysfunction).  You have cuts or sores on your feet that are not healing. Get help right away if:  Your arm or leg turns cold and blue.  Your arms or legs become red, warm, swollen, painful, or numb.  You have chest pain or trouble breathing.  You suddenly have weakness in your face, arm, or leg.  You become very confused or you cannot speak.  You suddenly have a very bad headache.  You suddenly cannot see. This information is not intended to replace advice given to you by your health care provider. Make sure you discuss any questions you have with your health care provider. Document Released: 10/08/2009 Document Revised: 12/20/2015 Document Reviewed: 12/22/2013 Elsevier Interactive Patient Education  2017 [employer].
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
## 2017-08-13 ENCOUNTER — Non-Acute Institutional Stay (SKILLED_NURSING_FACILITY): Payer: Medicare Other | Admitting: Internal Medicine

## 2017-08-13 ENCOUNTER — Encounter: Payer: Self-pay | Admitting: Internal Medicine

## 2017-08-13 DIAGNOSIS — I2510 Atherosclerotic heart disease of native coronary artery without angina pectoris: Secondary | ICD-10-CM | POA: Diagnosis not present

## 2017-08-13 DIAGNOSIS — I10 Essential (primary) hypertension: Secondary | ICD-10-CM

## 2017-08-13 DIAGNOSIS — M109 Gout, unspecified: Secondary | ICD-10-CM | POA: Diagnosis not present

## 2017-08-13 DIAGNOSIS — G8929 Other chronic pain: Secondary | ICD-10-CM

## 2017-08-13 DIAGNOSIS — N289 Disorder of kidney and ureter, unspecified: Secondary | ICD-10-CM | POA: Diagnosis not present

## 2017-08-13 DIAGNOSIS — G4700 Insomnia, unspecified: Secondary | ICD-10-CM | POA: Diagnosis not present

## 2017-08-13 DIAGNOSIS — J42 Unspecified chronic bronchitis: Secondary | ICD-10-CM | POA: Diagnosis not present

## 2017-08-13 DIAGNOSIS — Z9861 Coronary angioplasty status: Secondary | ICD-10-CM | POA: Diagnosis not present

## 2017-08-13 DIAGNOSIS — Z720 Tobacco use: Secondary | ICD-10-CM | POA: Diagnosis not present

## 2017-08-13 DIAGNOSIS — I739 Peripheral vascular disease, unspecified: Secondary | ICD-10-CM

## 2017-08-13 DIAGNOSIS — E785 Hyperlipidemia, unspecified: Secondary | ICD-10-CM | POA: Diagnosis not present

## 2017-08-13 DIAGNOSIS — I5032 Chronic diastolic (congestive) heart failure: Secondary | ICD-10-CM | POA: Diagnosis not present

## 2017-08-13 NOTE — Progress Notes (Signed)
Location:   [facility] Room Number: 107/W Place of Service:  SNF (31) Provider:  Clydene Fake, MD  Patient Care Team: [name], MD as PCP - General (Internal Medicine)  Extended Emergency Contact Information Primary Emergency Contact: Harvel Quale, [place] [address] [place] Mobile Phone: [phone] Relation: Daughter  Code Status:  Full Code Goals of care: Advanced Directive information Advanced Directives 08/13/2017  Does Patient Have a Medical Advance Directive? Yes  Type of Advance Directive (No Data)  Does patient want to make changes to medical advance directive? No - Patient declined  Copy of [place] in Chart? No - copy requested  Would patient like information on creating a medical advance directive? No - Patient declined  Pre-existing out of facility DNR order (yellow form or pink MOST form) -     Chief Complaint  Patient presents with  . Medical Management of Chronic Issues    Routine Visit, Due Colonoscopy    HPI:  Pt is a 62 y.o. male seen today for medical management of chronic diseases.   Patient has H/O Severe Peripheral Artery disease,S/P Right AKA. S/P Left femoral Endarterectomy 02/09/16 , Hypertension , CAD s/p PTCA, CHF, Hyperlipidemia, COPD, Continuous to smoke, Carotidstenosis. And Arthritis on Chronic Narcotics.  Patient was recently seen by [facility]  Since he had his developed small Venous Ulcer.  They had put Unna boot on his Left Leg.  Follow with them every week for next 4weeks. Patient continues to have cough but states it is much better since Symbicort was started.  He continues to smoke and go outside in cold weather.  Patient has also gained almost 10 lbs and his weight is now 235 lbs. He says that he ate a lot over the Christmas break. Patient also keeps complaining about insomnia.  He wants his Xanax dose to be increased.  As  discussed with the nurses before he sleeps soundly an increased dose has made him sleepy during the daytime. He continues to be on oxycodone for chronic pain control   Past Medical History:  Diagnosis Date  . Anemia, unspecified   . Arthritis    "shoulders" (09/22/2014)  . Asthma   . Atrial fibrillation ([place])   . Carotid artery occlusion   . CHF (congestive heart failure) ([place])   . COPD 11/27/2007   Qualifier: Diagnosis of  By: Garen Grams    . Coronary artery disease    Cardiac catheterization in 2008 showed 99% stenosis in proximal left circumflex which was treated with Taxus drug-eluting stent. There was mild RCA and LAD disease with normal ejection fraction.  Marland Kitchen ERECTILE DYSFUNCTION 11/27/2007   Qualifier: Diagnosis of  By: Garen Grams    . Gastric ulcer, unspecified as acute or chronic, without mention of hemorrhage, perforation, or obstruction   . Heart murmur   . HYPERLIPIDEMIA 11/27/2007   Qualifier: Diagnosis of  By: Garen Grams    . Hypertension   . HYPERTENSION, BENIGN 05/23/2009   Qualifier: Diagnosis of  By: Melvyn Novas MD, Christena Deem   . Other severe protein-calorie malnutrition   . PAD (peripheral artery disease) (H[facility])    Previous right SFA stent in 2003. Directional atherectomy right SFA in 01/2013  . Pneumonia 2000  . Pressure ulcer, lower back(707.03)   . Tobacco use   . Traumatic amputation of leg(s) (complete) (partial), unilateral, at or above knee, without mention  of complication    Past Surgical History:  Procedure Laterality Date  . ABDOMINAL AORTAGRAM N/A 02/23/2013   Procedure: ABDOMINAL Maxcine Ham;  Surgeon: Wellington Hampshire, MD;  Location: [facility] CATH LAB;  Service: Cardiovascular;  Laterality: N/A;  . ABDOMINAL AORTAGRAM N/A 09/22/2014   Procedure: ABDOMINAL Maxcine Ham;  Surgeon: Elam Dutch, MD;  Location[facility] C[facility] LAB;  Service: Cardiovascular;  Laterality: N/A;  . ACNE CYST REMOVAL    . AMPUTATION Right 09/09/2013   Procedure: AMPUTATION ABOVE KNEE;   Surgeon: Rosetta Posner, MD;  Location: [place];  Service: Vascular;  Laterality: Right;  . ATHERECTOMY N/A 03/02/2013   Procedure: ATHERECTOMY;  Surgeon: Wellington Hampshire, MD;  Location: [facility] CATH LAB;  Service: Cardiovascular;  Laterality: N/A;  . CARDIAC CATHETERIZATION N/A 09/03/2015   Procedure: Left Heart Cath and Coronary Angiography;  Surgeon: Leonie Man, MD;  Location: [place] CV LAB;  Service: Cardiovascular;  Laterality: N/A;  . CARDIAC CATHETERIZATION N/A 09/03/2015   Procedure: Coronary Balloon Angioplasty;  Surgeon: Leonie Man, MD;  Location: [place] CV LAB;  Service: Cardiovascular;  Laterality: N/A;  . CORONARY ANGIOPLASTY  09/03/2015  . ENDARTERECTOMY Left 08/16/2013   Procedure: Exploration of Left Neck/Fix Bleeding;  Surgeon: Elam Dutch, MD;  Location: [place];  Service: Vascular;  Laterality: Left;  . ENDARTERECTOMY FEMORAL Left 01/30/2016   Procedure: ENDARTERECTOMY LEFT FEMORAL ARTERY;  Surgeon: Elam Dutch, MD;  Location: [facility] OR;  Service: Vascular;  Laterality: Left;  . ESOPHAGOGASTRODUODENOSCOPY N/A 09/08/2013   Procedure: ESOPHAGOGASTRODUODENOSCOPY (EGD);  Surgeon: Cleotis Nipper, MD;  Location[facility] ENDOSCOPY;  Service: Endoscopy;  Laterality: N/A;  . ESOPHAGOGASTRODUODENOSCOPY (EGD) WITH PROPOFOL N/A 01/05/2014   Procedure: ESOPHAGOGASTRODUODENOSCOPY (EGD) WITH PROPOFOL;  Surgeon: Cleotis Nipper, MD;  Location: [facility] ENDOSCOPY;  Service: Endoscopy;  Laterality: N/A;  . FEMORAL ARTERY STENT Right 2003   [name] 09/22/2014  . FLEXIBLE SIGMOIDOSCOPY N/A 09/08/2013   Procedure: FLEXIBLE SIGMOIDOSCOPY;  Surgeon: Cleotis Nipper, MD;  Location: [facility] ENDOSCOPY;  Service: Endoscopy;  Laterality: N/A;  unprepp  . ILIAC ARTERY STENT Left 09/22/2014  . NASAL SINUS SURGERY  2004  . PAROTIDECTOMY Right 03/29/2014   Procedure: PAROTIDECTOMY;  Surgeon: Ascencion Dike, MD;  Location: [place];  Service: ENT;  Laterality: Right;  . PATCH ANGIOPLASTY Left 01/30/2016   Procedure: LEFT  FEMORAL ARTYERY PATCH ANGIOPLASTY USING HEMASHIELD PLATINUM FINESSE PATCH;  Surgeon: Elam Dutch, MD;  Location: [place];  Service: Vascular;  Laterality: Left;  . PERIPHERAL VASCULAR CATHETERIZATION N/A 08/17/2015   Procedure: Abdominal Aortogram;  Surgeon: Elam Dutch, MD;  Location: [place] CV LAB;  Service: Cardiovascular;  Laterality: N/A;    Allergies  Allergen Reactions  . Codeine Nausea Only    Outpatient Encounter Medications as of 08/13/2017  Medication Sig  . ALPRAZolam (XANAX) 0.25 MG tablet Take 1 tablet (0.25 mg total) by mouth as needed for anxiety. From 06/22/2017-09/22/2016  . ALPRAZolam (XANAX) 0.5 MG tablet Take 0.5 mg by mouth at bedtime as needed for anxiety.  Marland Kitchen aspirin EC 81 MG tablet Take 1 tablet (81 mg total) by mouth daily.  . budesonide-formoterol (SYMBICORT) 80-4.5 MCG/ACT inhaler Inhale 2 puffs into the lungs 2 (two) times daily.  . cloNIDine (CATAPRES) 0.2 MG tablet Take 0.2 mg by mouth 2 (two) times daily.  . clopidogrel (PLAVIX) 75 MG tablet Take 75 mg by mouth daily. @@ 9:00 pm  . colchicine 0.6 MG tablet Take 0.3 mg by mouth daily.   Marland Kitchen  furosemide (LASIX) 20 MG tablet Take 20 mg by mouth daily. Sunday, Tuesday, Thursday, Saturday  . furosemide (LASIX) 40 MG tablet Take 40 mg by mouth daily. Mon,Wed,Fri  . isosorbide mononitrate (IMDUR) 30 MG 24 hr tablet Take 1 tablet (30 mg total) by mouth daily.  Marland Kitchen lisinopril (PRINIVIL,ZESTRIL) 40 MG tablet Take 40 mg by mouth daily. Reported on 11/22/2015  . metoprolol tartrate (LOPRESSOR) 25 MG tablet Take 12.5 mg by mouth 2 (two) times daily.  Marland Kitchen oxyCODONE (ROXICODONE) 15 MG immediate release tablet Take one tablet by mouth every 4 hours as needed for pain HOLD for sedation, resp depression  . pantoprazole (PROTONIX) 40 MG tablet Take 40 mg by mouth daily.  . potassium chloride SA (K-DUR,KLOR-CON) 20 MEQ tablet Take 20 mEq by mouth daily.   . simvastatin (ZOCOR) 40 MG tablet Take 40 mg by mouth daily.  Marland Kitchen  umeclidinium bromide (INCRUSE ELLIPTA) 62.5 MCG/INH AEPB Inhale 1 puff into the lungs daily.   No facility-administered encounter medications on file as of 08/13/2017.      Review of Systems  Review of Systems  Constitutional: Negative for activity change, appetite change, chills, diaphoresis, fatigue and fever.  HENT: Negative for mouth sores, postnasal drip, rhinorrhea, sinus pain and sore throat.   Respiratory: Negative for apnea, chest tightness, shortness of breath and wheezing.   Cardiovascular: Negative for chest pain, palpitations and leg swelling.  Gastrointestinal: Negative for abdominal distention, abdominal pain, constipation, diarrhea, nausea and vomiting.  Genitourinary: Negative for dysuria and frequency.  Musculoskeletal: Negative for arthralgias, joint swelling and myalgias.  Skin: Negative for rash.  Neurological: Negative for dizziness, syncope, weakness, light-headedness and numbness.  Psychiatric/Behavioral: Negative for behavioral problems, confusion    Immunization History  Administered Date(s) Administered  . DTP 05/28/2001  . Influenza Split 04/28/2017  . Influenza-Unspecified 05/26/2013, 05/04/2014, 04/29/2016  . Pneumococcal-Unspecified 07/28/2009, 05/06/2016  . Tdap 12/30/2012   Pertinent  Health Maintenance Due  Topic Date Due  . COLONOSCOPY  09/13/2017 (Originally 11/24/2006)  . INFLUENZA VACCINE  Completed   Fall Risk  04/06/2017 12/30/2012  Falls in the past year? No No   Functional Status Survey:    Vitals:   08/13/17 1350  BP: 130/80  Pulse: 84  Resp: 20  Temp: (!) 97.4 F (36.3 C)  TempSrc: Oral  SpO2: 98%  Weight: 235 lb 12.8 oz (107 kg)  Height: 5\' 8"  (1.727 m)   Body mass index is 35.85 kg/m. Physical Exam  Constitutional: He is oriented to person, place, and time. He appears well-developed and well-nourished.  HENT:  Head: Normocephalic.  Mouth/Throat: Oropharynx is clear and moist.  Eyes: Pupils are equal, round, and  reactive to light.  Neck: Neck supple.  Cardiovascular: Normal rate and normal heart sounds.  No murmur heard. Pulmonary/Chest: Effort normal.  Continues to have occasional  expiratory Wheezing Bilateral.  Abdominal: Soft. Bowel sounds are normal. He exhibits no distension. There is no tenderness. There is no rebound.  Musculoskeletal:  Mild Edema in Left LE.  Lymphadenopathy:    He has no cervical adenopathy.  Neurological: He is alert and oriented to person, place, and time.    Labs reviewed: Recent Labs    04/07/17 0700 05/05/17 0715 07/07/17 0723  NA 134* 134* 136  K 4.3 4.0 3.9  CL 92* 89* 93*  CO2 33* 33* 34*  GLUCOSE 107* 136* 103*  BUN 34* 26* 28*  CREATININE 1.51* 1.68* 1.54*  CALCIUM 9.4 8.9 9.1   Recent Labs  10/24/16 0212 02/24/17 0727 04/07/17 0700  AST 18 14* 15  ALT 13* 13* 12*  ALKPHOS 51 50 52  BILITOT 0.7 0.6 0.4  PROT 6.8 7.0 7.4  ALBUMIN 4.0 3.9 4.0   Recent Labs    02/24/17 0727 04/07/17 0700 07/07/17 0723  WBC 6.9 6.4 7.2  NEUTROABS 4.5 4.0 4.6  HGB 15.7 15.4 13.7  HCT 47.7 48.1 46.2  MCV 90.7 90.9 82.2  PLT 149* 161 211   Lab Results  Component Value Date   TSH 3.404 04/07/2017   Lab Results  Component Value Date   HGBA1C 5.6 10/24/2016   Lab Results  Component Value Date   CHOL 131 02/24/2017   HDL 30 (L) 02/24/2017   LDLCALC 53 02/24/2017   TRIG 241 (H) 02/24/2017   CHOLHDL 4.4 02/24/2017    Significant Diagnostic Results in last 30 days:  No results found.  Assessment/Plan COPD and Chronic Bronchitis Patient  is doing well on Symbicort and Ellipta.  He does not want to quit smoking has been discussed with him many times.  He also does not like to use duo nebs.  PAD  Continue on aspirin and Plavix Also on statin Repeat fasting lipid profile Follows with vascular Chronic diastolic CHF EF 55% in 7322 The patient has gained a lot of weight recently most likely due to eating more but with his expiratory  wheezing will check BNP. CAD S/P PTCA On aspirin and statin Continue on beta-blocker and Imdur Essential hypertension Blood pressure controlled on clonidine and Lasix ,lisinopril and metoprolol  Chronic pain due to arthritis and also narcotic abuse Patient continues to be on chronic oxycodone  Chronic kidney disease stage III Creatinine stable at 1.5  Hyperlipidemia with target LDL less than 70  Repeat fasting lipid profile Tobacco use continues to smoke  Insomnia Unfortunately cannot use lot of meds as his not covered by pharmacy Will continue Xanax as needed  Gout On colchicine and stable Family/ staff Communication:   Labs/tests ordered:

## 2017-08-18 ENCOUNTER — Encounter: Payer: Medicare Other | Admitting: Family

## 2017-08-18 ENCOUNTER — Encounter: Payer: Self-pay | Admitting: Family

## 2017-08-18 DIAGNOSIS — I83022 Varicose veins of left lower extremity with ulcer of calf: Secondary | ICD-10-CM

## 2017-08-18 DIAGNOSIS — L97221 Non-pressure chronic ulcer of left calf limited to breakdown of skin: Principal | ICD-10-CM

## 2017-08-18 NOTE — Progress Notes (Signed)
Nurse visit only for unna boot change

## 2017-08-19 ENCOUNTER — Encounter (HOSPITAL_COMMUNITY)
Admission: RE | Admit: 2017-08-19 | Discharge: 2017-08-19 | Disposition: A | Discharge status: Home / Self Care | Payer: Medicare Other | Source: Skilled Nursing Facility | Attending: Internal Medicine | Admitting: Internal Medicine

## 2017-08-19 DIAGNOSIS — Z89511 Acquired absence of right leg below knee: Secondary | ICD-10-CM | POA: Diagnosis not present

## 2017-08-19 DIAGNOSIS — E785 Hyperlipidemia, unspecified: Secondary | ICD-10-CM | POA: Insufficient documentation

## 2017-08-19 DIAGNOSIS — K259 Gastric ulcer, unspecified as acute or chronic, without hemorrhage or perforation: Secondary | ICD-10-CM | POA: Insufficient documentation

## 2017-08-19 DIAGNOSIS — J449 Chronic obstructive pulmonary disease, unspecified: Secondary | ICD-10-CM | POA: Insufficient documentation

## 2017-08-19 DIAGNOSIS — I739 Peripheral vascular disease, unspecified: Secondary | ICD-10-CM | POA: Diagnosis not present

## 2017-08-19 DIAGNOSIS — I509 Heart failure, unspecified: Secondary | ICD-10-CM | POA: Diagnosis not present

## 2017-08-19 DIAGNOSIS — I110 Hypertensive heart disease with heart failure: Secondary | ICD-10-CM | POA: Diagnosis not present

## 2017-08-19 DIAGNOSIS — G4700 Insomnia, unspecified: Secondary | ICD-10-CM | POA: Diagnosis not present

## 2017-08-19 LAB — CBC
HEMATOCRIT: 52.8 % — AB (ref 39.0–52.0)
HEMOGLOBIN: 15.4 g/dL (ref 13.0–17.0)
MCH: 24.1 pg — AB (ref 26.0–34.0)
MCHC: 29.2 g/dL — AB (ref 30.0–36.0)
MCV: 82.5 fL (ref 78.0–100.0)
Platelets: 217 10*3/uL (ref 150–400)
RBC: 6.4 MIL/uL — ABNORMAL HIGH (ref 4.22–5.81)
RDW: 20.1 % — AB (ref 11.5–15.5)
WBC: 7.5 10*3/uL (ref 4.0–10.5)

## 2017-08-19 LAB — LIPID PANEL
Cholesterol: 135 mg/dL (ref 0–200)
HDL: 31 mg/dL — ABNORMAL LOW (ref >40)
LDL CALC: 74 mg/dL (ref 0–99)
Total CHOL/HDL Ratio: 4.4 RATIO
Triglycerides: 150 mg/dL — ABNORMAL HIGH (ref <150)
VLDL: 30 mg/dL (ref 0–40)

## 2017-08-19 LAB — BASIC METABOLIC PANEL
Anion gap: 13 (ref 5–15)
BUN: 24 mg/dL — AB (ref 6–20)
CALCIUM: 9.4 mg/dL (ref 8.9–10.3)
CHLORIDE: 92 mmol/L — AB (ref 101–111)
CO2: 34 mmol/L — AB (ref 22–32)
CREATININE: 1.47 mg/dL — AB (ref 0.61–1.24)
GFR calc non Af Amer: 50 mL/min — ABNORMAL LOW (ref >60)
GFR, EST AFRICAN AMERICAN: 58 mL/min — AB (ref >60)
Glucose, Bld: 98 mg/dL (ref 65–99)
Potassium: 4.5 mmol/L (ref 3.5–5.1)
SODIUM: 139 mmol/L (ref 135–145)

## 2017-08-19 LAB — BRAIN NATRIURETIC PEPTIDE: B Natriuretic Peptide: 696 pg/mL — ABNORMAL HIGH (ref 0.0–100.0)

## 2017-08-20 ENCOUNTER — Other Ambulatory Visit: Payer: Self-pay

## 2017-08-20 MED ORDER — OXYCODONE HCL 15 MG PO TABS: ORAL_TABLET | ORAL | 0 refills | Status: DC

## 2017-08-20 NOTE — Telephone Encounter (Signed)
RX Fax for [facility]@ [phone]  

## 2017-08-21 ENCOUNTER — Non-Acute Institutional Stay (SKILLED_NURSING_FACILITY): Payer: Medicare Other | Admitting: Internal Medicine

## 2017-08-21 ENCOUNTER — Encounter: Payer: Self-pay | Admitting: Internal Medicine

## 2017-08-21 DIAGNOSIS — J42 Unspecified chronic bronchitis: Secondary | ICD-10-CM

## 2017-08-21 DIAGNOSIS — I5032 Chronic diastolic (congestive) heart failure: Secondary | ICD-10-CM | POA: Diagnosis not present

## 2017-08-21 DIAGNOSIS — IMO0002 Reserved for concepts with insufficient information to code with codable children: Secondary | ICD-10-CM | POA: Insufficient documentation

## 2017-08-21 DIAGNOSIS — I10 Essential (primary) hypertension: Secondary | ICD-10-CM

## 2017-08-21 DIAGNOSIS — I739 Peripheral vascular disease, unspecified: Secondary | ICD-10-CM | POA: Diagnosis not present

## 2017-08-21 NOTE — Progress Notes (Signed)
Location:   [facility] Room Number: 107/W Place of Service:  SNF (31) Provider:  Clydene Fake, MD  Patient Care Team: [name], MD as PCP - General (Internal Medicine)  Extended Emergency Contact Information Primary Emergency Contact: Harvel Quale, [place] [address] [place] Mobile Phone: [phone] Relation: Daughter  Code Status:  Full Code Goals of care: Advanced Directive information Advanced Directives 08/21/2017  Does Patient Have a Medical Advance Directive? Yes  Type of Advance Directive (No Data)  Does patient want to make changes to medical advance directive? No - Patient declined  Copy of Swartz in Chart? No - copy requested  Would patient like information on creating a medical advance directive? No - Patient declined  Pre-existing out of facility DNR order (yellow form or pink MOST form) -     Chief Complaint  Patient presents with  . Acute Visit    Weight Gain (L E Edema)    HPI:  Pt is a 61 y.o. male seen today for an acute visit for Weight Gain and Worsening LE edema.  Patient has H/O Severe Peripheral Artery disease,S/P Right AKA. S/P Left femoral Endarterectomy 02/09/16 , Hypertension , CAD s/p PTCA, CHF, Hyperlipidemia, COPD, Continuous to smoke, Carotidstenosis. And Arthritis on Chronic Narcotics.  I had recently see patient for routine and Noticed that patient has gained almost  10 lbs. He says that he was eating more during Christmas. But his Leg has more edema and he has rales again on exam. So had ordered BNP and his BNP is elevated more then 600.  He Continues to have Chronic Cough.He follows with Vascular for Venous ulcer. He denies Chest Pian or Fever. Past Medical History:  Diagnosis Date  . Anemia, unspecified   . Arthritis    "shoulders" (09/22/2014)  . Asthma   . Atrial fibrillation ([facility])   . Carotid artery occlusion   . CHF  (congestive heart failure) ([facility])   . COPD 11/27/2007   Qualifier: Diagnosis of  By: Garen Grams    . Coronary artery disease    Cardiac catheterization in 2008 showed 99% stenosis in proximal left circumflex which was treated with Taxus drug-eluting stent. There was mild RCA and LAD disease with normal ejection fraction.  Marland Kitchen ERECTILE DYSFUNCTION 11/27/2007   Qualifier: Diagnosis of  By: Garen Grams    . Gastric ulcer, unspecified as acute or chronic, without mention of hemorrhage, perforation, or obstruction   . Heart murmur   . HYPERLIPIDEMIA 11/27/2007   Qualifier: Diagnosis of  By: Garen Grams    . Hypertension   . HYPERTENSION, BENIGN 05/23/2009   Qualifier: Diagnosis of  By: Melvyn Novas MD, Christena Deem   . Other severe protein-calorie malnutrition   . PAD (peripheral artery disease) (H[facility])    Previous right SFA stent in 2003. Directional atherectomy right SFA in 01/2013  . Pneumonia 2000  . Pressure ulcer, lower back(707.03)   . Tobacco use   . Traumatic amputation of leg(s) (complete) (partial), unilateral, at or above knee, without mention of complication    Past Surgical History:  Procedure Laterality Date  . ABDOMINAL AORTAGRAM N/A 02/23/2013   Procedure: ABDOMINAL Maxcine Ham;  Surgeon: Wellington Hampshire, MD;  Location: [facility] CATH LAB;  Service: Cardiovascular;  Laterality: N/A;  . ABDOMINAL AORTAGRAM N/A 09/22/2014   Procedure: ABDOMINAL Maxcine Ham;  Surgeon: Elam Dutch, MD;  Location: [facility] CATH LAB;  Service: Cardiovascular;  Laterality: N/A;  . ACNE CYST REMOVAL    . AMPUTATION Right 09/09/2013   Procedure: AMPUTATION ABOVE KNEE;  Surgeon: Rosetta Posner, MD;  Location: [facility];  Service: Vascular;  Laterality: Right;  . ATHERECTOMY N/A 03/02/2013   Procedure: ATHERECTOMY;  Surgeon: Wellington Hampshire, MD;  Location: [facility] CATH LAB;  Service: Cardiovascular;  Laterality: N/A;  . CARDIAC CATHETERIZATION N/A 09/03/2015   Procedure: Left Heart Cath and Coronary Angiography;  Surgeon: Leonie Man, MD;  Location: [facility] CV LAB;  Service: Cardiovascular;  Laterality: N/A;  . CARDIAC CATHETERIZATION N/A 09/03/2015   Procedure: Coronary Balloon Angioplasty;  Surgeon: Leonie Man, MD;  Location: [facility] CV LAB;  Service: Cardiovascular;  Laterality: N/A;  . CORONARY ANGIOPLASTY  09/03/2015  . ENDARTERECTOMY Left 08/16/2013   Procedure: Exploration of Left Neck/Fix Bleeding;  Surgeon: Elam Dutch, MD;  Location: [facility];  Service: Vascular;  Laterality: Left;  . ENDARTERECTOMY FEMORAL Left 01/30/2016   Procedure: ENDARTERECTOMY LEFT FEMORAL ARTERY;  Surgeon: Elam Dutch, MD;  Location: [facility] OR;  Service: Vascular;  Laterality: Left;  . ESOPHAGOGASTRODUODENOSCOPY N/A 09/08/2013   Procedure: ESOPHAGOGASTRODUODENOSCOPY (EGD);  Surgeon: Cleotis Nipper, MD;  Location: [facility] ENDOSCOPY;  Service: Endoscopy;  Laterality: N/A;  . ESOPHAGOGASTRODUODENOSCOPY (EGD) WITH PROPOFOL N/A 01/05/2014   Procedure: ESOPHAGOGASTRODUODENOSCOPY (EGD) WITH PROPOFOL;  Surgeon: Cleotis Nipper, MD;  Location: [facility] ENDOSCOPY;  Service: Endoscopy;  Laterality: N/A;  . FEMORAL ARTERY STENT Right 2003   [name] 09/22/2014  . FLEXIBLE SIGMOIDOSCOPY N/A 09/08/2013   Procedure: FLEXIBLE SIGMOIDOSCOPY;  Surgeon: Cleotis Nipper, MD;  Location: [facility] ENDOSCOPY;  Service: Endoscopy;  Laterality: N/A;  unprepp  . ILIAC ARTERY STENT Left 09/22/2014  . NASAL SINUS SURGERY  2004  . PAROTIDECTOMY Right 03/29/2014   Procedure: PAROTIDECTOMY;  Surgeon: Ascencion Dike, MD;  Location: [facility];  Service: ENT;  Laterality: Right;  . PATCH ANGIOPLASTY Left 01/30/2016   Procedure: LEFT FEMORAL ARTYERY PATCH ANGIOPLASTY USING HEMASHIELD PLATINUM FINESSE PATCH;  Surgeon: Elam Dutch, MD;  Location: [facility];  Service: Vascular;  Laterality: Left;  . PERIPHERAL VASCULAR CATHETERIZATION N/A 08/17/2015   Procedure: Abdominal Aortogram;  Surgeon: Elam Dutch, MD;  Location: [facility] CV LAB;  Service: Cardiovascular;  Laterality: N/A;      Allergies  Allergen Reactions  . Codeine Nausea Only    Outpatient Encounter Medications as of 08/21/2017  Medication Sig  . ALPRAZolam (XANAX) 0.25 MG tablet Take 1 tablet (0.25 mg total) by mouth as needed for anxiety. From 06/22/2017-09/22/2016  . ALPRAZolam (XANAX) 0.5 MG tablet Take 0.5 mg by mouth at bedtime as needed for anxiety.  Marland Kitchen aspirin EC 81 MG tablet Take 1 tablet (81 mg total) by mouth daily.  . budesonide-formoterol (SYMBICORT) 80-4.5 MCG/ACT inhaler Inhale 2 puffs into the lungs 2 (two) times daily.  . cloNIDine (CATAPRES) 0.2 MG tablet Take 0.2 mg by mouth 2 (two) times daily.  . clopidogrel (PLAVIX) 75 MG tablet Take 75 mg by mouth daily. @@ 9:00 pm  . colchicine 0.6 MG tablet Take 0.3 mg by mouth daily.   . furosemide (LASIX) 20 MG tablet Take 20 mg by mouth daily. Sunday, Tuesday, Thursday, Saturday  . furosemide (LASIX) 40 MG tablet Take 40 mg by mouth daily. Mon,Wed,Fri  . isosorbide mononitrate (IMDUR) 30 MG 24 hr tablet Take 1 tablet (30 mg total) by mouth daily.  Marland Kitchen lisinopril (PRINIVIL,ZESTRIL) 40 MG tablet Take 40 mg by mouth daily.  Reported on 11/22/2015  . metoprolol tartrate (LOPRESSOR) 25 MG tablet Take 12.5 mg by mouth 2 (two) times daily.  Marland Kitchen oxyCODONE (ROXICODONE) 15 MG immediate release tablet Take one tablet by mouth every 4 hours as needed for pain HOLD for sedation, resp depression  . pantoprazole (PROTONIX) 40 MG tablet Take 40 mg by mouth daily.  . potassium chloride SA (K-DUR,KLOR-CON) 20 MEQ tablet Take 20 mEq by mouth daily.   . simvastatin (ZOCOR) 40 MG tablet Take 40 mg by mouth daily.  Marland Kitchen umeclidinium bromide (INCRUSE ELLIPTA) 62.5 MCG/INH AEPB Inhale 1 puff into the lungs daily.   No facility-administered encounter medications on file as of 08/21/2017.      Review of Systems  Review of Systems  Constitutional: Negative for activity change, appetite change, chills, diaphoresis, fatigue and fever.  HENT: Negative for mouth sores, postnasal  drip, rhinorrhea, sinus pain and sore throat.   Respiratory: Negative for apnea,  chest tightness, shortness of breath and wheezing.   Cardiovascular: Negative for chest pain, palpitations  Gastrointestinal: Negative for abdominal distention, abdominal pain, constipation, diarrhea, nausea and vomiting.  Genitourinary: Negative for dysuria and frequency.  Musculoskeletal: Negative for arthralgias, joint swelling and myalgias.  Skin: Negative for rash.  Neurological: Negative for dizziness, syncope, weakness, light-headedness and numbness.  Psychiatric/Behavioral: Negative for behavioral problems, confusion and sleep disturbance.     Immunization History  Administered Date(s) Administered  . DTP 05/28/2001  . Influenza Split 04/28/2017  . Influenza-Unspecified 05/26/2013, 05/04/2014, 04/29/2016  . Pneumococcal-Unspecified 07/28/2009, 05/06/2016  . Tdap 12/30/2012   Pertinent  Health Maintenance Due  Topic Date Due  . COLONOSCOPY  09/13/2017 (Originally 11/24/2006)  . INFLUENZA VACCINE  Completed   Fall Risk  04/06/2017 12/30/2012  Falls in the past year? No No   Functional Status Survey:    Vitals:   08/21/17 0911  BP: 140/79  Pulse: 72  Resp: 19  Temp: 98.6 F (37 C)  TempSrc: Oral  SpO2: 97%   There is no height or weight on file to calculate BMI. Physical Exam  Constitutional: He is oriented to person, place, and time. He appears well-developed and well-nourished.  HENT:  Head: Normocephalic.  Mouth/Throat: Oropharynx is clear and moist.  Eyes: Pupils are equal, round, and reactive to light.  Neck: Neck supple.  Cardiovascular: Normal rate and normal heart sounds.  Pulmonary/Chest: Effort normal. He has wheezes. He has rales.  Expiratory Wheezing  Abdominal: Soft. Bowel sounds are normal. He exhibits no distension. There is no tenderness. There is no rebound.  Musculoskeletal:  Unna Boot on Left LE. Right LE above Knee amputation  Lymphadenopathy:    He has no  cervical adenopathy.  Neurological: He is alert and oriented to person, place, and time.    Labs reviewed: Recent Labs    05/05/17 0715 07/07/17 0723 08/19/17 0702  NA 134* 136 139  K 4.0 3.9 4.5  CL 89* 93* 92*  CO2 33* 34* 34*  GLUCOSE 136* 103* 98  BUN 26* 28* 24*  CREATININE 1.68* 1.54* 1.47*  CALCIUM 8.9 9.1 9.4   Recent Labs    10/24/16 0212 02/24/17 0727 04/07/17 0700  AST 18 14* 15  ALT 13* 13* 12*  ALKPHOS 51 50 52  BILITOT 0.7 0.6 0.4  PROT 6.8 7.0 7.4  ALBUMIN 4.0 3.9 4.0   Recent Labs    02/24/17 0727 04/07/17 0700 07/07/17 0723 08/19/17 0702  WBC 6.9 6.4 7.2 7.5  NEUTROABS 4.5 4.0 4.6  --   HGB  15.7 15.4 13.7 15.4  HCT 47.7 48.1 46.2 52.8*  MCV 90.7 90.9 82.2 82.5  PLT 149* 161 211 217   Lab Results  Component Value Date   TSH 3.404 04/07/2017   Lab Results  Component Value Date   HGBA1C 5.6 10/24/2016   Lab Results  Component Value Date   CHOL 135 08/19/2017   HDL 31 (L) 08/19/2017   LDLCALC 74 08/19/2017   TRIG 150 (H) 08/19/2017   CHOLHDL 4.4 08/19/2017    Significant Diagnostic Results in last 30 days:  No results found.  Assessment/Plan  LE edema  With weight gain Chronic Diastolic CHF. Will increase the Lasix to 40 mg BID Patient says he was on that dose before . Will also Schedule him For 2 D Echo His last stress test was in 07/2015 with EF of 45 % Repeat BMP in 1 week. Weights 3 /Week PAD  Continue on aspirin and Plavix Also on statin LDL 74 in 01/19 Follows with vascular CAD S/P PTCA On aspirin and statin Continue on beta-blocker and Imdur Essential hypertension Blood pressure controlled on clonidine and Lasix ,lisinopril and metoprolol  Chronic pain due to arthritis and also narcotic abuse Patient continues to be on chronic oxycodone  Chronic kidney disease stage III Creatinine stable at 1.5 Repeat BMP in 1 week on High Dose of Lasix  Hyperlipidemia with target LDL less than 70  Repeat fasting lipid  profile Tobacco use continues to smoke  Insomnia Unfortunately cannot use lot of meds as his not covered by pharmacy Will continue Xanax as needed  Gout On colchicine and stable  Family/ staff Communication:   Labs/tests ordered:  BMP Total time spent in this patient care encounter was _25 minutes; greater than 50% of the visit spent counseling patient, reviewing records , Labs and coordinating care for problems addressed at this encounter.

## 2017-08-25 ENCOUNTER — Other Ambulatory Visit: Payer: Self-pay

## 2017-08-25 ENCOUNTER — Encounter: Payer: Self-pay | Admitting: Family

## 2017-08-25 ENCOUNTER — Encounter: Payer: Medicare Other | Admitting: Family

## 2017-08-25 VITALS — BP 115/68 | HR 64 | Temp 97.1°F | Resp 20 | Ht 68.0 in | Wt 225.0 lb

## 2017-08-25 DIAGNOSIS — L97221 Non-pressure chronic ulcer of left calf limited to breakdown of skin: Principal | ICD-10-CM

## 2017-08-25 DIAGNOSIS — I83022 Varicose veins of left lower extremity with ulcer of calf: Secondary | ICD-10-CM

## 2017-08-25 NOTE — Progress Notes (Signed)
Nurse visit only for unna boot change

## 2017-08-27 DIAGNOSIS — I2510 Atherosclerotic heart disease of native coronary artery without angina pectoris: Secondary | ICD-10-CM | POA: Diagnosis not present

## 2017-08-27 DIAGNOSIS — R931 Abnormal findings on diagnostic imaging of heart and coronary circulation: Secondary | ICD-10-CM | POA: Diagnosis not present

## 2017-08-28 ENCOUNTER — Non-Acute Institutional Stay (SKILLED_NURSING_FACILITY): Payer: Medicare Other | Admitting: Internal Medicine

## 2017-08-28 ENCOUNTER — Encounter: Payer: Self-pay | Admitting: Internal Medicine

## 2017-08-28 ENCOUNTER — Encounter (HOSPITAL_COMMUNITY)
Admission: RE | Admit: 2017-08-28 | Discharge: 2017-08-28 | Disposition: A | Discharge status: Home / Self Care | Payer: Medicare Other | Source: Skilled Nursing Facility | Attending: Internal Medicine | Admitting: Internal Medicine

## 2017-08-28 DIAGNOSIS — N289 Disorder of kidney and ureter, unspecified: Secondary | ICD-10-CM

## 2017-08-28 DIAGNOSIS — G4700 Insomnia, unspecified: Secondary | ICD-10-CM | POA: Insufficient documentation

## 2017-08-28 DIAGNOSIS — D649 Anemia, unspecified: Secondary | ICD-10-CM | POA: Insufficient documentation

## 2017-08-28 DIAGNOSIS — Z89511 Acquired absence of right leg below knee: Secondary | ICD-10-CM | POA: Diagnosis not present

## 2017-08-28 DIAGNOSIS — I10 Essential (primary) hypertension: Secondary | ICD-10-CM | POA: Diagnosis not present

## 2017-08-28 DIAGNOSIS — R635 Abnormal weight gain: Secondary | ICD-10-CM | POA: Diagnosis not present

## 2017-08-28 DIAGNOSIS — J449 Chronic obstructive pulmonary disease, unspecified: Secondary | ICD-10-CM | POA: Diagnosis not present

## 2017-08-28 DIAGNOSIS — I5032 Chronic diastolic (congestive) heart failure: Secondary | ICD-10-CM

## 2017-08-28 DIAGNOSIS — I739 Peripheral vascular disease, unspecified: Secondary | ICD-10-CM | POA: Diagnosis not present

## 2017-08-28 DIAGNOSIS — K259 Gastric ulcer, unspecified as acute or chronic, without hemorrhage or perforation: Secondary | ICD-10-CM | POA: Diagnosis not present

## 2017-08-28 LAB — BASIC METABOLIC PANEL
Anion gap: 14 (ref 5–15)
BUN: 36 mg/dL — ABNORMAL HIGH (ref 6–20)
CHLORIDE: 88 mmol/L — AB (ref 101–111)
CO2: 32 mmol/L (ref 22–32)
CREATININE: 1.81 mg/dL — AB (ref 0.61–1.24)
Calcium: 8.6 mg/dL — ABNORMAL LOW (ref 8.9–10.3)
GFR calc non Af Amer: 39 mL/min — ABNORMAL LOW (ref >60)
GFR, EST AFRICAN AMERICAN: 45 mL/min — AB (ref >60)
Glucose, Bld: 146 mg/dL — ABNORMAL HIGH (ref 65–99)
Potassium: 3.3 mmol/L — ABNORMAL LOW (ref 3.5–5.1)
Sodium: 134 mmol/L — ABNORMAL LOW (ref 135–145)

## 2017-08-28 NOTE — Progress Notes (Signed)
Location:    Nursing Home Room Number: 107/W Place of Service:  SNF (31) Provider:  Freddi Starr, MD  Patient Care Team: [name], MD as PCP - General (Internal Medicine)  Extended Emergency Contact Information Primary Emergency Contact: [place], [place] [address] [place] Mobile Phone: [phone] Relation: Daughter  Goals of care: Advanced Directive information Advanced Directives 08/28/2017  Does Patient Have a Medical Advance Directive? Yes  Type of Advance Directive (No Data)  Does patient want to make changes to medical advance directive? No - Patient declined  Copy of [place] in Chart? No - copy requested  Would patient like information on creating a medical advance directive? No - Patient declined  Pre-existing out of facility DNR order (yellow form or pink MOST form) -     Chief complaint acute visit to follow-up renal function and weight gain with history of diastolic CHF  HPI:  Pt is a 62 y.o. male seen today for an acute visit for follow-up of weight gain with increased edema with history of diastolic CHF.  Patient has a history of multiple medical issues including severe peripheral arterial disease status post right AKA and status post left femoral endarterectomy back in July 2017.  Also a history of hypertension coronary artery disease as well as diastolic CHF hyperlipidemia COPD he continues to smoke.  Complicated with carotid stenosis as well as arthritis on chronic narcotics.  During a recent routine visit was noted he gained about 10 pounds-patient attributed this to eating over the holidays but leg was noted to have some increased edema and he had rales on exam.  Dr. Lyndel Safe did order a BNP which was elevated above his baseline at more than 600.  His Lasix was increased to  40 mg twice daily and he is scheduled for a 2D echo as well. His last stress in January 2017 showed  ejection fraction of 45%.  His weights have been updated and appears he is lost about 7 pounds since the beginning of the month he is now 228 pounds.  Repeat metabolic panel was also obtained today which shows his creatinine has gone up somewhat to 1.81 previously had been around 1.5.    Potassium also was slightly low at 3.3.   He is not complaining of shortness of breath today or decreased energy continues to go out side and smoke does not report any significant respiratory decline or chest pain.            Past Medical History:  Diagnosis Date  . Anemia, unspecified   . Arthritis    "shoulders" (09/22/2014)  . Asthma   . Atrial fibrillation ([facility])   . Carotid artery occlusion   . CHF (congestive heart failure) ([facility])   . COPD 11/27/2007   Qualifier: Diagnosis of  By: Garen Grams    . Coronary artery disease    Cardiac catheterization in 2008 showed 99% stenosis in proximal left circumflex which was treated with Taxus drug-eluting stent. There was mild RCA and LAD disease with normal ejection fraction.  Marland Kitchen ERECTILE DYSFUNCTION 11/27/2007   Qualifier: Diagnosis of  By: Garen Grams    . Gastric ulcer, unspecified as acute or chronic, without mention of hemorrhage, perforation, or obstruction   . Heart murmur   . HYPERLIPIDEMIA 11/27/2007   Qualifier: Diagnosis of  By: Garen Grams    . Hypertension   . HYPERTENSION, BENIGN  05/23/2009   Qualifier: Diagnosis of  By: Melvyn Novas MD, Christena Deem   . Other severe protein-calorie malnutrition   . PAD (peripheral artery disease) ([facility])    Previous right SFA stent in 2003. Directional atherectomy right SFA in 01/2013  . Pneumonia 2000  . Pressure ulcer, lower back(707.03)   . Tobacco use   . Traumatic amputation of leg(s) (complete) (partial), unilateral, at or above knee, without mention of complication    Past Surgical History:  Procedure Laterality Date  . ABDOMINAL AORTAGRAM N/A 02/23/2013   Procedure: ABDOMINAL  Maxcine Ham;  Surgeon: Wellington Hampshire, MD;  Location: [facility] CATH LAB;  Service: Cardiovascular;  Laterality: N/A;  . ABDOMINAL AORTAGRAM N/A 09/22/2014   Procedure: ABDOMINAL Maxcine Ham;  Surgeon: Elam Dutch, MD;  Location: [facility] CATH LAB;  Service: Cardiovascular;  Laterality: N/A;  . ACNE CYST REMOVAL    . AMPUTATION Right 09/09/2013   Procedure: AMPUTATION ABOVE KNEE;  Surgeon: Rosetta Posner, MD;  Location: [facility];  Service: Vascular;  Laterality: Right;  . ATHERECTOMY N/A 03/02/2013   Procedure: ATHERECTOMY;  Surgeon: Wellington Hampshire, MD;  Location: [facility] CATH LAB;  Service: Cardiovascular;  Laterality: N/A;  . CARDIAC CATHETERIZATION N/A 09/03/2015   Procedure: Left Heart Cath and Coronary Angiography;  Surgeon: Leonie Man, MD;  Location: [facility] CV LAB;  Service: Cardiovascular;  Laterality: N/A;  . CARDIAC CATHETERIZATION N/A 09/03/2015   Procedure: Coronary Balloon Angioplasty;  Surgeon: Leonie Man, MD;  Location: [facility] CV LAB;  Service: Cardiovascular;  Laterality: N/A;  . CORONARY ANGIOPLASTY  09/03/2015  . ENDARTERECTOMY Left 08/16/2013   Procedure: Exploration of Left Neck/Fix Bleeding;  Surgeon: Elam Dutch, MD;  Location: [facility];  Service: Vascular;  Laterality: Left;  . ENDARTERECTOMY FEMORAL Left 01/30/2016   Procedure: ENDARTERECTOMY LEFT FEMORAL ARTERY;  Surgeon: Elam Dutch, MD;  Location: [facility] OR;  Service: Vascular;  Laterality: Left;  . ESOPHAGOGASTRODUODENOSCOPY N/A 09/08/2013   Procedure: ESOPHAGOGASTRODUODENOSCOPY (EGD);  Surgeon: Cleotis Nipper, MD;  Location: [facility] ENDOSCOPY;  Service: Endoscopy;  Laterality: N/A;  . ESOPHAGOGASTRODUODENOSCOPY (EGD) WITH PROPOFOL N/A 01/05/2014   Procedure: ESOPHAGOGASTRODUODENOSCOPY (EGD) WITH PROPOFOL;  Surgeon: Cleotis Nipper, MD;  Location: [facility] ENDOSCOPY;  Service: Endoscopy;  Laterality: N/A;  . FEMORAL ARTERY STENT Right 2003   [name] 09/22/2014  . FLEXIBLE SIGMOIDOSCOPY N/A 09/08/2013   Procedure: FLEXIBLE  SIGMOIDOSCOPY;  Surgeon: Cleotis Nipper, MD;  Location: [facility] ENDOSCOPY;  Service: Endoscopy;  Laterality: N/A;  unprepp  . ILIAC ARTERY STENT Left 09/22/2014  . NASAL SINUS SURGERY  2004  . PAROTIDECTOMY Right 03/29/2014   Procedure: PAROTIDECTOMY;  Surgeon: Ascencion Dike, MD;  Location: [facility];  Service: ENT;  Laterality: Right;  . PATCH ANGIOPLASTY Left 01/30/2016   Procedure: LEFT FEMORAL ARTYERY PATCH ANGIOPLASTY USING HEMASHIELD PLATINUM FINESSE PATCH;  Surgeon: Elam Dutch, MD;  Location: [facility];  Service: Vascular;  Laterality: Left;  . PERIPHERAL VASCULAR CATHETERIZATION N/A 08/17/2015   Procedure: Abdominal Aortogram;  Surgeon: Elam Dutch, MD;  Location: [facility] CV LAB;  Service: Cardiovascular;  Laterality: N/A;    Allergies  Allergen Reactions  . Codeine Nausea Only    Outpatient Encounter Medications as of 08/28/2017  Medication Sig  . ALPRAZolam (XANAX) 0.25 MG tablet Take 1 tablet (0.25 mg total) by mouth as needed for anxiety. From 06/22/2017-09/22/2016  . ALPRAZolam (XANAX) 0.5 MG tablet Take 0.5 mg by mouth at bedtime as needed for anxiety.  Marland Kitchen aspirin EC 81 MG  tablet Take 1 tablet (81 mg total) by mouth daily.  . budesonide-formoterol (SYMBICORT) 80-4.5 MCG/ACT inhaler Inhale 2 puffs into the lungs 2 (two) times daily.  . cloNIDine (CATAPRES) 0.2 MG tablet Take 0.2 mg by mouth 2 (two) times daily.  . clopidogrel (PLAVIX) 75 MG tablet Take 75 mg by mouth daily. @@ 9:00 pm  . colchicine 0.6 MG tablet Take 0.3 mg by mouth daily.   . furosemide (LASIX) 40 MG tablet Take 40 mg by mouth 2 (two) times daily.   . isosorbide mononitrate (IMDUR) 30 MG 24 hr tablet Take 1 tablet (30 mg total) by mouth daily.  Marland Kitchen lisinopril (PRINIVIL,ZESTRIL) 40 MG tablet Take 40 mg by mouth daily. Reported on 11/22/2015  . metoprolol tartrate (LOPRESSOR) 25 MG tablet Take 12.5 mg by mouth 2 (two) times daily.  Marland Kitchen oxyCODONE (ROXICODONE) 15 MG immediate release tablet Take one tablet by mouth every 4  hours as needed for pain HOLD for sedation, resp depression  . pantoprazole (PROTONIX) 40 MG tablet Take 40 mg by mouth daily.  . potassium chloride SA (K-DUR,KLOR-CON) 20 MEQ tablet Take 20 mEq by mouth daily.   . simvastatin (ZOCOR) 40 MG tablet Take 40 mg by mouth daily.  Marland Kitchen umeclidinium bromide (INCRUSE ELLIPTA) 62.5 MCG/INH AEPB Inhale 1 puff into the lungs daily.  . [DISCONTINUED] furosemide (LASIX) 20 MG tablet Take 20 mg by mouth daily. Sunday, Tuesday, Thursday, Saturday   No facility-administered encounter medications on file as of 08/28/2017.     Review of Systems   In general is not complaining of any fever chills his weight appears to have had a slow decline.  Skin is not complain of rashes or itching at this point.  Does have a history of blisters on left lower leg which has improved.  Head ears eyes nose mouth and throat does not complain of visual changes or sore throat.  Respiratory does not complain of increased cough beyond baseline for shortness of breath.  Cardiac does not complain of chest pain does have some left lower extremity edema currently has a compression device on.  GI is not complaining of abdominal discomfort nausea vomiting diarrhea constipation.  Musculoskeletal is status post right below the knee amputation is not complaining of joint pain currently but has have routine oxycodone for arthritic pain especially in his shoulders.  Neurologic does not complain of dizziness headache or numbness or syncope.  Psych does not complain of overt depression or anxiety at this time he is quite adamant he needs his Xanax at at bedtime however.    Immunization History  Administered Date(s) Administered  . DTP 05/28/2001  . Influenza Split 04/28/2017  . Influenza-Unspecified 05/26/2013, 05/04/2014, 04/29/2016  . Pneumococcal-Unspecified 07/28/2009, 05/06/2016  . Tdap 12/30/2012   Pertinent  Health Maintenance Due  Topic Date Due  . COLONOSCOPY  09/13/2017  (Originally 11/24/2006)  . INFLUENZA VACCINE  Completed   Fall Risk  04/06/2017 12/30/2012  Falls in the past year? No No   Functional Status Survey:    Vitals:   08/28/17 1416  BP: (!) 159/78  Pulse: 88  Resp: 20  Temp: 98.7 F (37.1 C)  TempSrc: Oral  SpO2: 97%  Weight is 228 pounds Of note manual blood pressure was 134/80 Physical Exam   In general this is a well-developed middle-aged male in no distress sitting comfortably in his wheelchair.  His skin is warm and dry.  Oropharynx clear mucous membranes moist.  Chest he continues to have some scattered rails  but no labored breathing this is relatively baseline with previous exams.  Heart is somewhat distant heart sounds regular rate and rhythm without murmur gallop or rub.  He has compression device on the left lower extremity-appears to have relatively baseline edema here.  Abdomen is soft nontender obese with positive bowel sounds.  Musculoskeletal does ambulate in wheelchair again does have a compression device-boot on left lower extremity has a right above-the-knee amputation.  Neurologic is grossly intact no lateralizing findings his speech is clear.  Psych he is alert and oriented pleasant and appropriate  Labs reviewed: Recent Labs    07/07/17 0723 08/19/17 0702 08/28/17 0700  NA 136 139 134*  K 3.9 4.5 3.3*  CL 93* 92* 88*  CO2 34* 34* 32  GLUCOSE 103* 98 146*  BUN 28* 24* 36*  CREATININE 1.54* 1.47* 1.81*  CALCIUM 9.1 9.4 8.6*   Recent Labs    10/24/16 0212 02/24/17 0727 04/07/17 0700  AST 18 14* 15  ALT 13* 13* 12*  ALKPHOS 51 50 52  BILITOT 0.7 0.6 0.4  PROT 6.8 7.0 7.4  ALBUMIN 4.0 3.9 4.0   Recent Labs    02/24/17 0727 04/07/17 0700 07/07/17 0723 08/19/17 0702  WBC 6.9 6.4 7.2 7.5  NEUTROABS 4.5 4.0 4.6  --   HGB 15.7 15.4 13.7 15.4  HCT 47.7 48.1 46.2 52.8*  MCV 90.7 90.9 82.2 82.5  PLT 149* 161 211 217   Lab Results  Component Value Date   TSH 3.404 04/07/2017   Lab  Results  Component Value Date   HGBA1C 5.6 10/24/2016   Lab Results  Component Value Date   CHOL 135 08/19/2017   HDL 31 (L) 08/19/2017   LDLCALC 74 08/19/2017   TRIG 150 (H) 08/19/2017   CHOLHDL 4.4 08/19/2017    Significant Diagnostic Results in last 30 days:  No results found.  Assessment/Plan #1 lower extremity edema with weight gain and chronic diastolic CHF-he has had the increased dose of Lasix now for 1 week-weight appears to be trending down although there is some variability- he has a 2D echo scheduled.  Update a metabolic panel today shows creatinine has gone up somewhat from his baseline at 1.81-Laithan is fairly adamant he would really like to keep on this dose of Lasix since he feels he is benefiting from it and has been on this before at this point will need to keep a close eye on this and update metabolic panel next week This also was discussed with Dr.Gupta  via phone  #2 renal insufficiency as noted above creatinine has risen somewhat to 1.81 with a BUN of 36 baseline is around 1.5 creatinine this will need updating early next week to keep an eye on renal function-.  3.  Hypokalemia he is on potassium 20 mEq a day we will increase this up to 20 mEq twice daily and update a metabolic panel early next week.   #4 hypertension continues on numerous agents--including clonidine lisinopril metoprolol and Lasix which was recently increased at this point will monitor I suspect some of the higher readings occasionally come from a smaller cuff and he actually requires a larger manual cuff   [id number]   e

## 2017-08-31 ENCOUNTER — Encounter (HOSPITAL_COMMUNITY)
Admission: RE | Admit: 2017-08-31 | Discharge: 2017-08-31 | Disposition: A | Discharge status: Home / Self Care | Payer: Medicare Other | Source: Skilled Nursing Facility | Attending: *Deleted | Admitting: *Deleted

## 2017-08-31 DIAGNOSIS — D649 Anemia, unspecified: Secondary | ICD-10-CM | POA: Diagnosis not present

## 2017-08-31 DIAGNOSIS — J449 Chronic obstructive pulmonary disease, unspecified: Secondary | ICD-10-CM | POA: Diagnosis not present

## 2017-08-31 DIAGNOSIS — I739 Peripheral vascular disease, unspecified: Secondary | ICD-10-CM | POA: Diagnosis not present

## 2017-08-31 DIAGNOSIS — Z89511 Acquired absence of right leg below knee: Secondary | ICD-10-CM | POA: Diagnosis not present

## 2017-08-31 DIAGNOSIS — G4700 Insomnia, unspecified: Secondary | ICD-10-CM | POA: Diagnosis not present

## 2017-08-31 DIAGNOSIS — K259 Gastric ulcer, unspecified as acute or chronic, without hemorrhage or perforation: Secondary | ICD-10-CM | POA: Diagnosis not present

## 2017-08-31 LAB — BASIC METABOLIC PANEL
Anion gap: 14 (ref 5–15)
BUN: 35 mg/dL — ABNORMAL HIGH (ref 6–20)
CALCIUM: 9 mg/dL (ref 8.9–10.3)
CHLORIDE: 89 mmol/L — AB (ref 101–111)
CO2: 34 mmol/L — ABNORMAL HIGH (ref 22–32)
CREATININE: 1.82 mg/dL — AB (ref 0.61–1.24)
GFR calc non Af Amer: 39 mL/min — ABNORMAL LOW (ref >60)
GFR, EST AFRICAN AMERICAN: 45 mL/min — AB (ref >60)
Glucose, Bld: 140 mg/dL — ABNORMAL HIGH (ref 65–99)
Potassium: 4 mmol/L (ref 3.5–5.1)
SODIUM: 137 mmol/L (ref 135–145)

## 2017-09-01 ENCOUNTER — Ambulatory Visit: Payer: Medicare Other | Admitting: Family

## 2017-09-01 DIAGNOSIS — F17200 Nicotine dependence, unspecified, uncomplicated: Secondary | ICD-10-CM

## 2017-09-01 DIAGNOSIS — Z89611 Acquired absence of right leg above knee: Secondary | ICD-10-CM

## 2017-09-01 DIAGNOSIS — I83022 Varicose veins of left lower extremity with ulcer of calf: Secondary | ICD-10-CM

## 2017-09-01 DIAGNOSIS — L97221 Non-pressure chronic ulcer of left calf limited to breakdown of skin: Principal | ICD-10-CM

## 2017-09-01 NOTE — Progress Notes (Signed)
Patient came in for an [place] change to his left lower leg.  He denied having pain.  Pt says his leg is "looking much better".  There was no drainage or redness at ulcer site.  I cleaned patient's leg and re-applied a new Unna Boot without complications.  Pt has a follow up visit on 09/08/17.  Thurston Hole., LPN

## 2017-09-02 ENCOUNTER — Other Ambulatory Visit: Payer: Self-pay

## 2017-09-02 MED ORDER — ALPRAZOLAM 0.25 MG PO TABS: 0.2500 mg | ORAL_TABLET | ORAL | 0 refills | Status: DC | PRN

## 2017-09-02 MED ORDER — OXYCODONE HCL 15 MG PO TABS: ORAL_TABLET | ORAL | 0 refills | Status: DC

## 2017-09-02 NOTE — Telephone Encounter (Signed)
RX Fax for [facility]@ [phone]  

## 2017-09-07 ENCOUNTER — Encounter (HOSPITAL_COMMUNITY)
Admission: RE | Admit: 2017-09-07 | Discharge: 2017-09-07 | Disposition: A | Discharge status: Home / Self Care | Payer: Medicare Other | Source: Skilled Nursing Facility | Attending: *Deleted | Admitting: *Deleted

## 2017-09-07 DIAGNOSIS — D649 Anemia, unspecified: Secondary | ICD-10-CM | POA: Diagnosis not present

## 2017-09-07 DIAGNOSIS — G4700 Insomnia, unspecified: Secondary | ICD-10-CM | POA: Diagnosis not present

## 2017-09-07 DIAGNOSIS — I739 Peripheral vascular disease, unspecified: Secondary | ICD-10-CM | POA: Diagnosis not present

## 2017-09-07 DIAGNOSIS — Z89511 Acquired absence of right leg below knee: Secondary | ICD-10-CM | POA: Diagnosis not present

## 2017-09-07 DIAGNOSIS — J449 Chronic obstructive pulmonary disease, unspecified: Secondary | ICD-10-CM | POA: Diagnosis not present

## 2017-09-07 DIAGNOSIS — K259 Gastric ulcer, unspecified as acute or chronic, without hemorrhage or perforation: Secondary | ICD-10-CM | POA: Diagnosis not present

## 2017-09-07 LAB — BASIC METABOLIC PANEL
Anion gap: 12 (ref 5–15)
BUN: 36 mg/dL — AB (ref 6–20)
CO2: 34 mmol/L — ABNORMAL HIGH (ref 22–32)
CREATININE: 1.81 mg/dL — AB (ref 0.61–1.24)
Calcium: 9.4 mg/dL (ref 8.9–10.3)
Chloride: 92 mmol/L — ABNORMAL LOW (ref 101–111)
GFR, EST AFRICAN AMERICAN: 45 mL/min — AB (ref >60)
GFR, EST NON AFRICAN AMERICAN: 39 mL/min — AB (ref >60)
Glucose, Bld: 99 mg/dL (ref 65–99)
Potassium: 4.7 mmol/L (ref 3.5–5.1)
SODIUM: 138 mmol/L (ref 135–145)

## 2017-09-08 ENCOUNTER — Encounter: Payer: Self-pay | Admitting: Family

## 2017-09-08 ENCOUNTER — Other Ambulatory Visit: Payer: Self-pay

## 2017-09-08 ENCOUNTER — Non-Acute Institutional Stay (SKILLED_NURSING_FACILITY): Payer: Medicare Other | Admitting: Internal Medicine

## 2017-09-08 ENCOUNTER — Encounter: Payer: Self-pay | Admitting: Internal Medicine

## 2017-09-08 ENCOUNTER — Ambulatory Visit (INDEPENDENT_AMBULATORY_CARE_PROVIDER_SITE_OTHER): Payer: Medicare Other | Admitting: Family

## 2017-09-08 VITALS — BP 114/63 | HR 79 | Temp 98.1°F | Resp 20 | Ht 68.0 in | Wt 228.0 lb

## 2017-09-08 DIAGNOSIS — L97221 Non-pressure chronic ulcer of left calf limited to breakdown of skin: Secondary | ICD-10-CM

## 2017-09-08 DIAGNOSIS — I779 Disorder of arteries and arterioles, unspecified: Secondary | ICD-10-CM | POA: Diagnosis not present

## 2017-09-08 DIAGNOSIS — I6523 Occlusion and stenosis of bilateral carotid arteries: Secondary | ICD-10-CM

## 2017-09-08 DIAGNOSIS — N289 Disorder of kidney and ureter, unspecified: Secondary | ICD-10-CM

## 2017-09-08 DIAGNOSIS — G8929 Other chronic pain: Secondary | ICD-10-CM | POA: Diagnosis not present

## 2017-09-08 DIAGNOSIS — Z9862 Peripheral vascular angioplasty status: Secondary | ICD-10-CM | POA: Diagnosis not present

## 2017-09-08 DIAGNOSIS — Z95828 Presence of other vascular implants and grafts: Secondary | ICD-10-CM

## 2017-09-08 DIAGNOSIS — I5032 Chronic diastolic (congestive) heart failure: Secondary | ICD-10-CM | POA: Diagnosis not present

## 2017-09-08 DIAGNOSIS — F17200 Nicotine dependence, unspecified, uncomplicated: Secondary | ICD-10-CM

## 2017-09-08 DIAGNOSIS — I739 Peripheral vascular disease, unspecified: Secondary | ICD-10-CM | POA: Diagnosis not present

## 2017-09-08 DIAGNOSIS — J42 Unspecified chronic bronchitis: Secondary | ICD-10-CM

## 2017-09-08 DIAGNOSIS — Z89611 Acquired absence of right leg above knee: Secondary | ICD-10-CM

## 2017-09-08 DIAGNOSIS — I83022 Varicose veins of left lower extremity with ulcer of calf: Secondary | ICD-10-CM | POA: Diagnosis not present

## 2017-09-08 NOTE — Patient Instructions (Signed)
To decrease swelling in your foot and leg: Elevate feet above slightly bent knees, feet above heart, overnight and 3-4 times per day for 20 minutes.    Steps to Quit Smoking Smoking tobacco can be bad for your health. It can also affect almost every organ in your body. Smoking puts you and people around you at risk for many serious long-lasting (chronic) diseases. Quitting smoking is hard, but it is one of the best things that you can do for your health. It is never too late to quit. What are the benefits of quitting smoking? When you quit smoking, you lower your risk for getting serious diseases and conditions. They can include:  Lung cancer or lung disease.  Heart disease.  Stroke.  Heart attack.  Not being able to have children (infertility).  Weak bones (osteoporosis) and broken bones (fractures).  If you have coughing, wheezing, and shortness of breath, those symptoms may get better when you quit. You may also get sick less often. If you are pregnant, quitting smoking can help to lower your chances of having a baby of low birth weight. What can I do to help me quit smoking? Talk with your doctor about what can help you quit smoking. Some things you can do (strategies) include:  Quitting smoking totally, instead of slowly cutting back how much you smoke over a period of time.  Going to in-person counseling. You are more likely to quit if you go to many counseling sessions.  Using resources and support systems, such as: ? Database administrator with a Social worker. ? Phone quitlines. ? Careers information officer. ? Support groups or group counseling. ? Text messaging programs. ? Mobile phone apps or applications.  Taking medicines. Some of these medicines may have nicotine in them. If you are pregnant or breastfeeding, do not take any medicines to quit smoking unless your doctor says it is okay. Talk with your doctor about counseling or other things that can help you.  Talk with your  doctor about using more than one strategy at the same time, such as taking medicines while you are also going to in-person counseling. This can help make quitting easier. What things can I do to make it easier to quit? Quitting smoking might feel very hard at first, but there is a lot that you can do to make it easier. Take these steps:  Talk to your family and friends. Ask them to support and encourage you.  Call phone quitlines, reach out to support groups, or work with a Social worker.  Ask people who smoke to not smoke around you.  Avoid places that make you want (trigger) to smoke, such as: ? Bars. ? Parties. ? Smoke-break areas at work.  Spend time with people who do not smoke.  Lower the stress in your life. Stress can make you want to smoke. Try these things to help your stress: ? Getting regular exercise. ? Deep-breathing exercises. ? Yoga. ? Meditating. ? Doing a body scan. To do this, close your eyes, focus on one area of your body at a time from head to toe, and notice which parts of your body are tense. Try to relax the muscles in those areas.  Download or buy apps on your mobile phone or tablet that can help you stick to your quit plan. There are many free apps, such as QuitGuide from the [employer] Office manager for Disease Control and Prevention). You can find more support from smokefree.gov and other websites.  This information is not  intended to replace advice given to you by your health care provider. Make sure you discuss any questions you have with your health care provider. Document Released: 05/10/2009 Document Revised: 03/11/2016 Document Reviewed: 11/28/2014 Elsevier Interactive Patient Education  2018 [employer].      Chronic Venous Insufficiency Chronic venous insufficiency, also called venous stasis, is a condition that prevents blood from being pumped effectively through the veins in your legs. Blood may no longer be pumped effectively from the legs back to the  heart. This condition can range from mild to severe. With proper treatment, you should be able to continue with an active life. What are the causes? Chronic venous insufficiency occurs when the vein walls become stretched, weakened, or damaged, or when valves within the vein are damaged. Some common causes of this include:  High blood pressure inside the veins (venous hypertension).  Increased blood pressure in the leg veins from long periods of sitting or standing.  A blood clot that blocks blood flow in a vein (deep vein thrombosis, DVT).  Inflammation of a vein (phlebitis) that causes a blood clot to form.  Tumors in the pelvis that cause blood to back up.  What increases the risk? The following factors may make you more likely to develop this condition:  Having a family history of this condition.  Obesity.  Pregnancy.  Living without enough physical activity or exercise (sedentary lifestyle).  Smoking.  Having a job that requires long periods of standing or sitting in one place.  Being a certain age. Women in their 94s and 49s and men in their 6s are more likely to develop this condition.  What are the signs or symptoms? Symptoms of this condition include:  Veins that are enlarged, bulging, or twisted (varicose veins).  Skin breakdown or ulcers.  Reddened or discolored skin on the front of the leg.  Brown, smooth, tight, and painful skin just above the ankle, usually on the inside of the leg (lipodermatosclerosis).  Swelling.  How is this diagnosed? This condition may be diagnosed based on:  Your medical history.  A physical exam.  Tests, such as: ? A procedure that creates an image of a blood vessel and nearby organs and provides information about blood flow through the blood vessel (duplex ultrasound). ? A procedure that tests blood flow (plethysmography). ? A procedure to look at the veins using X-ray and dye (venogram).  How is this treated? The goals  of treatment are to help you return to an active life and to minimize pain or disability. Treatment depends on the severity of your condition, and it may include:  Wearing compression stockings. These can help relieve symptoms and help prevent your condition from getting worse. However, they do not cure the condition.  Sclerotherapy. This is a procedure involving an injection of a material that "dissolves" damaged veins.  Surgery. This may involve: ? Removing a diseased vein (vein stripping). ? Cutting off blood flow through the vein (laser ablation surgery). ? Repairing a valve.  Follow these instructions at home:  Wear compression stockings as told by your health care provider. These stockings help to prevent blood clots and reduce swelling in your legs.  Take over-the-counter and prescription medicines only as told by your health care provider.  Stay active by exercising, walking, or doing different activities. Ask your health care provider what activities are safe for you and how much exercise you need.  Drink enough fluid to keep your urine clear or pale yellow.  Do not use any products that contain nicotine or tobacco, such as cigarettes and e-cigarettes. If you need help quitting, ask your health care provider.  Keep all follow-up visits as told by your health care provider. This is important. Contact a health care provider if:  You have redness, swelling, or more pain in the affected area.  You see a red streak or line that extends up or down from the affected area.  You have skin breakdown or a loss of skin in the affected area, even if the breakdown is small.  You get an injury in the affected area. Get help right away if:  You get an injury and an open wound in the affected area.  You have severe pain that does not get better with medicine.  You have sudden numbness or weakness in the foot or ankle below the affected area, or you have trouble moving your foot or  ankle.  You have a fever and you have worse or persistent symptoms.  You have chest pain.  You have shortness of breath. Summary  Chronic venous insufficiency, also called venous stasis, is a condition that prevents blood from being pumped effectively through the veins in your legs.  Chronic venous insufficiency occurs when the vein walls become stretched, weakened, or damaged, or when valves within the vein are damaged.  Treatment for this condition depends on how severe your condition is, and it may involve wearing compression stockings or having a procedure.  Make sure you stay active by exercising, walking, or doing different activities. Ask your health care provider what activities are safe for you and how much exercise you need. This information is not intended to replace advice given to you by your health care provider. Make sure you discuss any questions you have with your health care provider. Document Released: 11/17/2006 Document Revised: 06/02/2016 Document Reviewed: 06/02/2016 Elsevier Interactive Patient Education  2017 [employer].
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
## 2017-09-15 ENCOUNTER — Encounter (HOSPITAL_COMMUNITY)
Admission: RE | Admit: 2017-09-15 | Discharge: 2017-09-15 | Disposition: A | Discharge status: Home / Self Care | Payer: Medicare Other | Source: Skilled Nursing Facility | Attending: Internal Medicine | Admitting: Internal Medicine

## 2017-09-15 DIAGNOSIS — G4700 Insomnia, unspecified: Secondary | ICD-10-CM | POA: Insufficient documentation

## 2017-09-15 DIAGNOSIS — J449 Chronic obstructive pulmonary disease, unspecified: Secondary | ICD-10-CM | POA: Diagnosis not present

## 2017-09-15 DIAGNOSIS — I739 Peripheral vascular disease, unspecified: Secondary | ICD-10-CM | POA: Diagnosis not present

## 2017-09-15 DIAGNOSIS — M10072 Idiopathic gout, left ankle and foot: Secondary | ICD-10-CM | POA: Diagnosis not present

## 2017-09-15 DIAGNOSIS — Z89511 Acquired absence of right leg below knee: Secondary | ICD-10-CM | POA: Insufficient documentation

## 2017-09-15 LAB — BASIC METABOLIC PANEL
Anion gap: 12 (ref 5–15)
BUN: 43 mg/dL — AB (ref 6–20)
CALCIUM: 9.3 mg/dL (ref 8.9–10.3)
CHLORIDE: 90 mmol/L — AB (ref 101–111)
CO2: 33 mmol/L — AB (ref 22–32)
CREATININE: 1.77 mg/dL — AB (ref 0.61–1.24)
GFR calc Af Amer: 46 mL/min — ABNORMAL LOW (ref >60)
GFR calc non Af Amer: 40 mL/min — ABNORMAL LOW (ref >60)
Glucose, Bld: 122 mg/dL — ABNORMAL HIGH (ref 65–99)
Potassium: 4.2 mmol/L (ref 3.5–5.1)
SODIUM: 135 mmol/L (ref 135–145)

## 2017-09-29 ENCOUNTER — Encounter (HOSPITAL_COMMUNITY)
Admission: RE | Admit: 2017-09-29 | Discharge: 2017-09-29 | Disposition: A | Discharge status: Home / Self Care | Payer: Medicare Other | Source: Skilled Nursing Facility | Attending: Internal Medicine | Admitting: Internal Medicine

## 2017-09-29 DIAGNOSIS — I10 Essential (primary) hypertension: Secondary | ICD-10-CM | POA: Diagnosis not present

## 2017-09-29 DIAGNOSIS — M10072 Idiopathic gout, left ankle and foot: Secondary | ICD-10-CM | POA: Diagnosis not present

## 2017-09-29 DIAGNOSIS — Z89511 Acquired absence of right leg below knee: Secondary | ICD-10-CM | POA: Diagnosis not present

## 2017-09-29 DIAGNOSIS — G4700 Insomnia, unspecified: Secondary | ICD-10-CM | POA: Insufficient documentation

## 2017-09-29 DIAGNOSIS — I739 Peripheral vascular disease, unspecified: Secondary | ICD-10-CM | POA: Insufficient documentation

## 2017-09-29 DIAGNOSIS — J449 Chronic obstructive pulmonary disease, unspecified: Secondary | ICD-10-CM | POA: Insufficient documentation

## 2017-09-29 LAB — BASIC METABOLIC PANEL
Anion gap: 11 (ref 5–15)
BUN: 46 mg/dL — AB (ref 6–20)
CHLORIDE: 97 mmol/L — AB (ref 101–111)
CO2: 30 mmol/L (ref 22–32)
Calcium: 9.2 mg/dL (ref 8.9–10.3)
Creatinine, Ser: 1.86 mg/dL — ABNORMAL HIGH (ref 0.61–1.24)
GFR, EST AFRICAN AMERICAN: 44 mL/min — AB (ref >60)
GFR, EST NON AFRICAN AMERICAN: 38 mL/min — AB (ref >60)
Glucose, Bld: 120 mg/dL — ABNORMAL HIGH (ref 65–99)
Potassium: 4.3 mmol/L (ref 3.5–5.1)
SODIUM: 138 mmol/L (ref 135–145)

## 2017-10-02 ENCOUNTER — Other Ambulatory Visit: Payer: Self-pay

## 2017-10-02 MED ORDER — OXYCODONE HCL 15 MG PO TABS: ORAL_TABLET | ORAL | 0 refills | Status: DC

## 2017-10-02 NOTE — Telephone Encounter (Signed)
RX Fax for [facility]@ [phone]  

## 2017-10-14 ENCOUNTER — Other Ambulatory Visit: Payer: Self-pay

## 2017-10-14 MED ORDER — ALPRAZOLAM 0.5 MG PO TABS: 0.5000 mg | ORAL_TABLET | Freq: Every evening | ORAL | 0 refills | Status: DC | PRN

## 2017-10-14 NOTE — Telephone Encounter (Signed)
RX Fax for [facility]@ [phone]  

## 2017-10-15 ENCOUNTER — Non-Acute Institutional Stay (SKILLED_NURSING_FACILITY): Payer: Medicare Other | Admitting: Internal Medicine

## 2017-10-15 ENCOUNTER — Encounter: Payer: Self-pay | Admitting: Internal Medicine

## 2017-10-15 DIAGNOSIS — J42 Unspecified chronic bronchitis: Secondary | ICD-10-CM | POA: Diagnosis not present

## 2017-10-15 DIAGNOSIS — N289 Disorder of kidney and ureter, unspecified: Secondary | ICD-10-CM | POA: Diagnosis not present

## 2017-10-15 DIAGNOSIS — Z720 Tobacco use: Secondary | ICD-10-CM | POA: Diagnosis not present

## 2017-10-15 DIAGNOSIS — I5032 Chronic diastolic (congestive) heart failure: Secondary | ICD-10-CM

## 2017-10-15 DIAGNOSIS — I10 Essential (primary) hypertension: Secondary | ICD-10-CM

## 2017-10-15 NOTE — Progress Notes (Signed)
Location:   [facility] Room Number: 107/W Place of Service:  SNF [phone]) Provider:  Kyung Rudd, Rene Kocher, MD  Patient Care Team: [name], MD as PCP - General (Internal Medicine)  Extended Emergency Contact Information Primary Emergency Contact: Harvel Quale, [place] [address] [place] Mobile Phone: [phone] Relation: Daughter  Code Status:  Full Code Goals of care: Advanced Directive information Advanced Directives 10/15/2017  Does Patient Have a Medical Advance Directive? Yes  Type of Advance Directive (No Data)  Does patient want to make changes to medical advance directive? No - Patient declined  Copy of [place] in Chart? No - copy requested  Would patient like information on creating a medical advance directive? No - Patient declined  Pre-existing out of facility DNR order (yellow form or pink MOST form) -     Chief Complaint  Patient presents with  . Medical Management of Chronic Issues    Due Colonpscopy    HPI:  Pt is a 62 y.o. male seen today for medical management of chronic diseases.    Patient has H/O Severe Peripheral Artery disease,S/P Right AKA. S/P Left femoral Endarterectomy 02/09/16 , Hypertension , CAD s/p PTCA, CHF, Hyperlipidemia, COPD, Continuous to smoke, Carotidstenosis. And Arthritis on Chronic Narcotics.  Patient is Long term Geographical information systems officer. Patient is stable in facility. He continues to smoke. And Continuous to have Chronic Cough. Patient was started on Increase dose of Lasix and he has lost almost 10 lbs and his weight is back to 219 lbs. Swelling also is down in his legs. He continuous to ask for Xanax for insomnia. Though per Nurse she has been sleeping well. No New Nursing issues.      Past Medical History:  Diagnosis Date  . Anemia, unspecified   . Arthritis    "shoulders" (09/22/2014)  . Asthma   . Atrial fibrillation ([place])   .  Carotid artery occlusion   . CHF (congestive heart failure) ([place])   . COPD 11/27/2007   Qualifier: Diagnosis of  By: Garen Grams    . Coronary artery disease    Cardiac catheterization in 2008 showed 99% stenosis in proximal left circumflex which was treated with Taxus drug-eluting stent. There was mild RCA and LAD disease with normal ejection fraction.  Marland Kitchen ERECTILE DYSFUNCTION 11/27/2007   Qualifier: Diagnosis of  By: Garen Grams    . Gastric ulcer, unspecified as acute or chronic, without mention of hemorrhage, perforation, or obstruction   . Heart murmur   . HYPERLIPIDEMIA 11/27/2007   Qualifier: Diagnosis of  By: Garen Grams    . Hypertension   . HYPERTENSION, BENIGN 05/23/2009   Qualifier: Diagnosis of  By: Melvyn Novas MD, Christena Deem   . Other severe protein-calorie malnutrition   . PAD (peripheral artery disease) (H[facility])    Previous right SFA stent in 2003. Directional atherectomy right SFA in 01/2013  . Pneumonia 2000  . Pressure ulcer, lower back(707.03)   . Tobacco use   . Traumatic amputation of leg(s) (complete) (partial), unilateral, at or above knee, without mention of complication    Past Surgical History:  Procedure Laterality Date  . ABDOMINAL AORTAGRAM N/A 02/23/2013   Procedure: ABDOMINAL Maxcine Ham;  Surgeon: Wellington Hampshire, MD;  Location: [facility] CATH LAB;  Service: Cardiovascular;  Laterality: N/A;  . ABDOMINAL AORTAGRAM N/A 09/22/2014   Procedure: ABDOMINAL Maxcine Ham;  Surgeon: Elam Dutch, MD;  Location:  [facility] CATH LAB;  Service: Cardiovascular;  Laterality: N/A;  . ACNE CYST REMOVAL    . AMPUTATION Right 09/09/2013   Procedure: AMPUTATION ABOVE KNEE;  Surgeon: Rosetta Posner, MD;  Location: [facility];  Service: Vascular;  Laterality: Right;  . ATHERECTOMY N/A 03/02/2013   Procedure: ATHERECTOMY;  Surgeon: Wellington Hampshire, MD;  Location: [facility] CATH LAB;  Service: Cardiovascular;  Laterality: N/A;  . CARDIAC CATHETERIZATION N/A 09/03/2015   Procedure: Left Heart Cath and  Coronary Angiography;  Surgeon: Leonie Man, MD;  Location: [facility] CV LAB;  Service: Cardiovascular;  Laterality: N/A;  . CARDIAC CATHETERIZATION N/A 09/03/2015   Procedure: Coronary Balloon Angioplasty;  Surgeon: Leonie Man, MD;  Location: [facility] CV LAB;  Service: Cardiovascular;  Laterality: N/A;  . CORONARY ANGIOPLASTY  09/03/2015  . ENDARTERECTOMY Left 08/16/2013   Procedure: Exploration of Left Neck/Fix Bleeding;  Surgeon: Elam Dutch, MD;  Location: [facility];  Service: Vascular;  Laterality: Left;  . ENDARTERECTOMY FEMORAL Left 01/30/2016   Procedure: ENDARTERECTOMY LEFT FEMORAL ARTERY;  Surgeon: Elam Dutch, MD;  Location: [facility] OR;  Service: Vascular;  Laterality: Left;  . ESOPHAGOGASTRODUODENOSCOPY N/A 09/08/2013   Procedure: ESOPHAGOGASTRODUODENOSCOPY (EGD);  Surgeon: Cleotis Nipper, MD;  Location: [facility] ENDOSCOPY;  Service: Endoscopy;  Laterality: N/A;  . ESOPHAGOGASTRODUODENOSCOPY (EGD) WITH PROPOFOL N/A 01/05/2014   Procedure: ESOPHAGOGASTRODUODENOSCOPY (EGD) WITH PROPOFOL;  Surgeon: Cleotis Nipper, MD;  Location: [facility] ENDOSCOPY;  Service: Endoscopy;  Laterality: N/A;  . FEMORAL ARTERY STENT Right 2003   [name] 09/22/2014  . FLEXIBLE SIGMOIDOSCOPY N/A 09/08/2013   Procedure: FLEXIBLE SIGMOIDOSCOPY;  Surgeon: Cleotis Nipper, MD;  Location: [facility] ENDOSCOPY;  Service: Endoscopy;  Laterality: N/A;  unprepp  . ILIAC ARTERY STENT Left 09/22/2014  . NASAL SINUS SURGERY  2004  . PAROTIDECTOMY Right 03/29/2014   Procedure: PAROTIDECTOMY;  Surgeon: Ascencion Dike, MD;  Location: [place];  Service: ENT;  Laterality: Right;  . PATCH ANGIOPLASTY Left 01/30/2016   Procedure: LEFT FEMORAL ARTYERY PATCH ANGIOPLASTY USING HEMASHIELD PLATINUM FINESSE PATCH;  Surgeon: Elam Dutch, MD;  Location: [place];  Service: Vascular;  Laterality: Left;  . PERIPHERAL VASCULAR CATHETERIZATION N/A 08/17/2015   Procedure: Abdominal Aortogram;  Surgeon: Elam Dutch, MD;  Location: [place] CV LAB;   Service: Cardiovascular;  Laterality: N/A;    Allergies  Allergen Reactions  . Codeine Nausea Only    Outpatient Encounter Medications as of 10/15/2017  Medication Sig  . ALPRAZolam (XANAX) 0.25 MG tablet Take 1 tablet (0.25 mg total) by mouth as needed for anxiety. From 06/22/2017-09/22/2016  . ALPRAZolam (XANAX) 0.5 MG tablet Take 1 tablet (0.5 mg total) by mouth at bedtime as needed for anxiety.  Marland Kitchen aspirin EC 81 MG tablet Take 1 tablet (81 mg total) by mouth daily.  . budesonide-formoterol (SYMBICORT) 80-4.5 MCG/ACT inhaler Inhale 2 puffs into the lungs 2 (two) times daily.  . cloNIDine (CATAPRES) 0.2 MG tablet Take 0.2 mg by mouth 2 (two) times daily.  . clopidogrel (PLAVIX) 75 MG tablet Take 75 mg by mouth daily. @@ 9:00 pm  . colchicine 0.6 MG tablet Take 0.3 mg by mouth daily.   . furosemide (LASIX) 40 MG tablet Take 40 mg by mouth 2 (two) times daily.   . isosorbide mononitrate (IMDUR) 30 MG 24 hr tablet Take 1 tablet (30 mg total) by mouth daily.  Marland Kitchen lisinopril (PRINIVIL,ZESTRIL) 40 MG tablet Take 40 mg by mouth daily. Reported on 11/22/2015  . metoprolol tartrate (LOPRESSOR)  25 MG tablet Take 12.5 mg by mouth 2 (two) times daily.  Marland Kitchen oxyCODONE (ROXICODONE) 15 MG immediate release tablet Take one tablet by mouth every 4 hours as needed for pain HOLD for sedation, resp depression  . pantoprazole (PROTONIX) 40 MG tablet Take 40 mg by mouth daily.  . potassium chloride SA (K-DUR,KLOR-CON) 20 MEQ tablet Take 20 mEq by mouth 2 (two) times daily.   . simvastatin (ZOCOR) 40 MG tablet Take 40 mg by mouth daily.  Marland Kitchen umeclidinium bromide (INCRUSE ELLIPTA) 62.5 MCG/INH AEPB Inhale 1 puff into the lungs daily.   No facility-administered encounter medications on file as of 10/15/2017.      Review of Systems  Review of Systems  Constitutional: Negative for activity change, appetite change, chills, diaphoresis, fatigue and fever.  HENT: Negative for mouth sores, postnasal drip, rhinorrhea, sinus  pain and sore throat.   Respiratory: Negative for apnea, Positve for  cough, shortness of breath and wheezing.   Cardiovascular: Negative for chest pain, palpitations and leg swelling.  Gastrointestinal: Negative for abdominal distention, abdominal pain, constipation, diarrhea, nausea and vomiting.  Genitourinary: Negative for dysuria and frequency.  Musculoskeletal: Negative for arthralgias, joint swelling and myalgias.  Skin: Negative for rash.  Neurological: Negative for dizziness, syncope, weakness, light-headedness and numbness.  Psychiatric/Behavioral: Negative for behavioral problems, confusion and Positive for  sleep disturbance.     Immunization History  Administered Date(s) Administered  . DTP 05/28/2001  . Influenza Split 04/28/2017  . Influenza-Unspecified 05/26/2013, 05/04/2014, 04/29/2016  . Pneumococcal-Unspecified 07/28/2009, 05/06/2016  . Tdap 12/30/2012   Pertinent  Health Maintenance Due  Topic Date Due  . COLONOSCOPY  11/15/2017 (Originally 11/24/2006)  . INFLUENZA VACCINE  Completed   Fall Risk  04/06/2017 12/30/2012  Falls in the past year? No No   Functional Status Survey:    Vitals:   10/15/17 1214  BP: (!) 188/89 Manually it was 160/80  Pulse: 68  Resp: 18  Temp: (!) 97.2 F (36.2 C)  TempSrc: Oral  SpO2: 98%  Weight: 219 lb (99.3 kg)  Height: 5\' 8"  (1.727 m)   Body mass index is 33.3 kg/m. Physical Exam  Constitutional: He is oriented to person, place, and time. He appears well-developed and well-nourished.  HENT:  Head: Normocephalic.  Mouth/Throat: Oropharynx is clear and moist.  Eyes: Pupils are equal, round, and reactive to light.  Neck: Neck supple.  Cardiovascular: Normal rate and normal heart sounds.  No murmur heard. Pulmonary/Chest: Effort normal.  Continuous to have Rales Bilateral but much better then Before  Abdominal: Soft. Bowel sounds are normal. He exhibits no distension. There is no tenderness. There is no rebound.    Musculoskeletal: He exhibits no edema.  Lymphadenopathy:    He has no cervical adenopathy.  Neurological: He is alert and oriented to person, place, and time.  Skin: Skin is warm and dry.  Psychiatric: He has a normal mood and affect. His behavior is normal.    Labs reviewed: Recent Labs    09/07/17 0630 09/15/17 0755 09/29/17 0710  NA 138 135 138  K 4.7 4.2 4.3  CL 92* 90* 97*  CO2 34* 33* 30  GLUCOSE 99 122* 120*  BUN 36* 43* 46*  CREATININE 1.81* 1.77* 1.86*  CALCIUM 9.4 9.3 9.2   Recent Labs    10/24/16 0212 02/24/17 0727 04/07/17 0700  AST 18 14* 15  ALT 13* 13* 12*  ALKPHOS 51 50 52  BILITOT 0.7 0.6 0.4  PROT 6.8 7.0 7.4  ALBUMIN  4.0 3.9 4.0   Recent Labs    02/24/17 0727 04/07/17 0700 07/07/17 0723 08/19/17 0702  WBC 6.9 6.4 7.2 7.5  NEUTROABS 4.5 4.0 4.6  --   HGB 15.7 15.4 13.7 15.4  HCT 47.7 48.1 46.2 52.8*  MCV 90.7 90.9 82.2 82.5  PLT 149* 161 211 217   Lab Results  Component Value Date   TSH 3.404 04/07/2017   Lab Results  Component Value Date   HGBA1C 5.6 10/24/2016   Lab Results  Component Value Date   CHOL 135 08/19/2017   HDL 31 (L) 08/19/2017   LDLCALC 74 08/19/2017   TRIG 150 (H) 08/19/2017   CHOLHDL 4.4 08/19/2017    Significant Diagnostic Results in last 30 days:  No results found.  Assessment/Plan COPD and Chronic Bronchitis Patient  is doing well on Symbicort and Ellipta.  He does not want to quit smoking has been discussed with him many times.  He also does not like to use duo nebs.  PAD  Continue on aspirin and Plavix Also on statin Repeat fasting lipid profile Showed LDL of 74 in 01/19 Follows with vascular Chronic diastolic CHF EF 16% in 9678 The patient has gained a lot of weight recently most likely due to eating more. He also had elevated BNP and he has responded well to increase dose of Lasix CAD S/P PTCA On aspirin and statin Continue on beta-blocker and Imdur Essential hypertension Blood pressure  has been elevated recently. Will increase his Clonidine to 0.3 Mg BID and also Increase the Beta blocker to 25 mg QD Continue Monitor BP QD for few weeks.  Chronic pain due to arthritis and also narcotic abuse Patient continues to be on chronic oxycodone  Chronic kidney disease stage III Creatinine has been slightly elevated since he was started on Lasix Will continue to monitor.  Hyperlipidemia with target LDL less than 70  LDL was 74 in recent Lipid Profile  Tobacco use  continues to smoke  Insomnia Unfortunately cannot use lot of meds as his not covered by pharmacy Will continue Xanax as needed I D/W agin that I cannot increase his Xanax anymore as it is not a good drug for Helping with Insomnia   Gout On colchicine and stable      Family/ staff Communication:   Labs/tests ordered:    .Total time spent in this patient care encounter was 25_ minutes; greater than 50% of the visit spent counseling patient, reviewing records , Labs and coordinating care for problems addressed at this encounter.

## 2017-10-16 ENCOUNTER — Non-Acute Institutional Stay (SKILLED_NURSING_FACILITY): Payer: Medicare Other | Admitting: Internal Medicine

## 2017-10-16 ENCOUNTER — Encounter: Payer: Self-pay | Admitting: Internal Medicine

## 2017-10-16 DIAGNOSIS — I10 Essential (primary) hypertension: Secondary | ICD-10-CM | POA: Diagnosis not present

## 2017-10-16 NOTE — Progress Notes (Signed)
Location:   [place] Room Number: 107/W Place of Service:  SNF [phone]) Provider:  Freddi Starr, MD  Patient Care Team: [name], MD as PCP - General (Internal Medicine)  Extended Emergency Contact Information Primary Emergency Contact: [place], [place] [address] [place] Mobile Phone: [phone] Relation: Daughter  Code Status:  Full Code Goals of care: Advanced Directive information Advanced Directives 10/16/2017  Does Patient Have a Medical Advance Directive? Yes  Type of Advance Directive (No Data)  Does patient want to make changes to medical advance directive? No - Patient declined  Copy of [place] in Chart? No - copy requested  Would patient like information on creating a medical advance directive? No - Patient declined  Pre-existing out of facility DNR order (yellow form or pink MOST form) -     Chief complaint acute visit follow-up blood pressure  HPI:  Pt is a 62 y.o. male seen today for an acute visit for follow-up of blood pressure.  Patient does have a history of hypertension as well as severe peripheral arterial disease status post AKA of the right- also history of hypertension coronary artery disease CHF hyperlipidemia COPD as well as carotid stenosis and arthritis on chronic narcotics.  He does have a history of hypertension and is on clonidine as well as Lopressor and lisinopril.  Patient was seen yesterday and secondary to an elevated systolic in excess of 025 at times his clonidine was increased up to 0.3 mg twice daily he had been on 0.2 mg twice daily.  His Lopressor also was increased to 25 mg twice daily.  However patient would prefer that he stay on the previous dose of clonidine of 0.2 mg twice daily and the previous dose of Lopressor 12.5 mg twice daily-he states his blood pressure is not always "that high"--and is quite insistent that he would do  better on the lower doses.  I did check his blood pressure manually today and it was 142/82 pulse was 62.  Speaking with nursing at times his systolic does rise fairly significantly this apparently is more when he is been outside wheeling in his wheelchair and smoking-  other times his blood pressure appears to be somewhat lower with  systolics in the 852D and 130s.     Past Medical History:  Diagnosis Date  . Anemia, unspecified   . Arthritis    "shoulders" (09/22/2014)  . Asthma   . Atrial fibrillation ([place])   . Carotid artery occlusion   . CHF (congestive heart failure) ([place])   . COPD 11/27/2007   Qualifier: Diagnosis of  By: Garen Grams    . Coronary artery disease    Cardiac catheterization in 2008 showed 99% stenosis in proximal left circumflex which was treated with Taxus drug-eluting stent. There was mild RCA and LAD disease with normal ejection fraction.  Marland Kitchen ERECTILE DYSFUNCTION 11/27/2007   Qualifier: Diagnosis of  By: Garen Grams    . Gastric ulcer, unspecified as acute or chronic, without mention of hemorrhage, perforation, or obstruction   . Heart murmur   . HYPERLIPIDEMIA 11/27/2007   Qualifier: Diagnosis of  By: Garen Grams    . Hypertension   . HYPERTENSION, BENIGN 05/23/2009   Qualifier: Diagnosis of  By: Melvyn Novas MD, Christena Deem   . Other severe protein-calorie malnutrition   . PAD (peripheral artery disease) (HCC)    Previous right SFA  stent in 2003. Directional atherectomy right SFA in 01/2013  . Pneumonia 2000  . Pressure ulcer, lower back(707.03)   . Tobacco use   . Traumatic amputation of leg(s) (complete) (partial), unilateral, at or above knee, without mention of complication    Past Surgical History:  Procedure Laterality Date  . ABDOMINAL AORTAGRAM N/A 02/23/2013   Procedure: ABDOMINAL Maxcine Ham;  Surgeon: Wellington Hampshire, MD;  Location: [place] CATH LAB;  Service: Cardiovascular;  Laterality: N/A;  . ABDOMINAL AORTAGRAM N/A 09/22/2014   Procedure:  ABDOMINAL Maxcine Ham;  Surgeon: Elam Dutch, MD;  Location: [facility] CATH LAB;  Service: Cardiovascular;  Laterality: N/A;  . ACNE CYST REMOVAL    . AMPUTATION Right 09/09/2013   Procedure: AMPUTATION ABOVE KNEE;  Surgeon: Rosetta Posner, MD;  Location: [place];  Service: Vascular;  Laterality: Right;  . ATHERECTOMY N/A 03/02/2013   Procedure: ATHERECTOMY;  Surgeon: Wellington Hampshire, MD;  Location: [facility] CATH LAB;  Service: Cardiovascular;  Laterality: N/A;  . CARDIAC CATHETERIZATION N/A 09/03/2015   Procedure: Left Heart Cath and Coronary Angiography;  Surgeon: Leonie Man, MD;  Location: [place] CV LAB;  Service: Cardiovascular;  Laterality: N/A;  . CARDIAC CATHETERIZATION N/A 09/03/2015   Procedure: Coronary Balloon Angioplasty;  Surgeon: Leonie Man, MD;  Location: [place] CV LAB;  Service: Cardiovascular;  Laterality: N/A;  . CORONARY ANGIOPLASTY  09/03/2015  . ENDARTERECTOMY Left 08/16/2013   Procedure: Exploration of Left Neck/Fix Bleeding;  Surgeon: Elam Dutch, MD;  Location: [place];  Service: Vascular;  Laterality: Left;  . ENDARTERECTOMY FEMORAL Left 01/30/2016   Procedure: ENDARTERECTOMY LEFT FEMORAL ARTERY;  Surgeon: Elam Dutch, MD;  Location: [facility] OR;  Service: Vascular;  Laterality: Left;  . ESOPHAGOGASTRODUODENOSCOPY N/A 09/08/2013   Procedure: ESOPHAGOGASTRODUODENOSCOPY (EGD);  Surgeon: Cleotis Nipper, MD;  Location: [facility] ENDOSCOPY;  Service: Endoscopy;  Laterality: N/A;  . ESOPHAGOGASTRODUODENOSCOPY (EGD) WITH PROPOFOL N/A 01/05/2014   Procedure: ESOPHAGOGASTRODUODENOSCOPY (EGD) WITH PROPOFOL;  Surgeon: Cleotis Nipper, MD;  Location: [facility] ENDOSCOPY;  Service: Endoscopy;  Laterality: N/A;  . FEMORAL ARTERY STENT Right 2003   [name] 09/22/2014  . FLEXIBLE SIGMOIDOSCOPY N/A 09/08/2013   Procedure: FLEXIBLE SIGMOIDOSCOPY;  Surgeon: Cleotis Nipper, MD;  Location: [facility] ENDOSCOPY;  Service: Endoscopy;  Laterality: N/A;  unprepp  . ILIAC ARTERY STENT Left 09/22/2014  . NASAL SINUS  SURGERY  2004  . PAROTIDECTOMY Right 03/29/2014   Procedure: PAROTIDECTOMY;  Surgeon: Ascencion Dike, MD;  Location: [place];  Service: ENT;  Laterality: Right;  . PATCH ANGIOPLASTY Left 01/30/2016   Procedure: LEFT FEMORAL ARTYERY PATCH ANGIOPLASTY USING HEMASHIELD PLATINUM FINESSE PATCH;  Surgeon: Elam Dutch, MD;  Location: [place];  Service: Vascular;  Laterality: Left;  . PERIPHERAL VASCULAR CATHETERIZATION N/A 08/17/2015   Procedure: Abdominal Aortogram;  Surgeon: Elam Dutch, MD;  Location: [place] CV LAB;  Service: Cardiovascular;  Laterality: N/A;    Allergies  Allergen Reactions  . Codeine Nausea Only    Outpatient Encounter Medications as of 10/16/2017  Medication Sig  . ALPRAZolam (XANAX) 0.25 MG tablet Take 1 tablet (0.25 mg total) by mouth as needed for anxiety. From 06/22/2017-09/22/2016  . ALPRAZolam (XANAX) 0.5 MG tablet Take 1 tablet (0.5 mg total) by mouth at bedtime as needed for anxiety.  Marland Kitchen aspirin EC 81 MG tablet Take 1 tablet (81 mg total) by mouth daily.  . budesonide-formoterol (SYMBICORT) 80-4.5 MCG/ACT inhaler Inhale 2 puffs into the lungs 2 (two) times daily.  . cloNIDine (  CATAPRES) 0.2 MG tablet Take 0.2 mg by mouth 2 (two) times daily.  . clopidogrel (PLAVIX) 75 MG tablet Take 75 mg by mouth daily. @@ 9:00 pm  . colchicine 0.6 MG tablet Take 0.3 mg by mouth daily.   . furosemide (LASIX) 40 MG tablet Take 40 mg by mouth 2 (two) times daily.   . isosorbide mononitrate (IMDUR) 30 MG 24 hr tablet Take 1 tablet (30 mg total) by mouth daily.  Marland Kitchen lisinopril (PRINIVIL,ZESTRIL) 40 MG tablet Take 40 mg by mouth daily. Reported on 11/22/2015  . metoprolol tartrate (LOPRESSOR) 25 MG tablet Take 12.5 mg by mouth 2 (two) times daily.  Marland Kitchen oxyCODONE (ROXICODONE) 15 MG immediate release tablet Take one tablet by mouth every 4 hours as needed for pain HOLD for sedation, resp depression  . pantoprazole (PROTONIX) 40 MG tablet Take 40 mg by mouth daily.  . potassium chloride SA  (K-DUR,KLOR-CON) 20 MEQ tablet Take 20 mEq by mouth 2 (two) times daily.   . simvastatin (ZOCOR) 40 MG tablet Take 40 mg by mouth daily.  Marland Kitchen umeclidinium bromide (INCRUSE ELLIPTA) 62.5 MCG/INH AEPB Inhale 1 puff into the lungs daily.   No facility-administered encounter medications on file as of 10/16/2017.     Review of Systems   General is not complaining any fever or chills.  Skin is not complain of rashes or itching  Head ears eyes nose mouth and throat is not complaining of any sore throat or visual changes.  Respiratory continues to smoke does not complain of increased shortness of breath or increased cough from baseline.  Cardiac does not complain of chest pain has some mild lower extremity edema.  GI does not complain of abdominal discomfort nausea vomiting diarrhea constipation.  Musculoskeletal is not complain of joint pain currently but does have a significant history of joint pain arthritis complaints and is on chronic narcotics.  Neurologic is not complaining of dizziness headache or syncope  Immunization History  Administered Date(s) Administered  . DTP 05/28/2001  . Influenza Split 04/28/2017  . Influenza-Unspecified 05/26/2013, 05/04/2014, 04/29/2016  . Pneumococcal-Unspecified 07/28/2009, 05/06/2016  . Tdap 12/30/2012   Pertinent  Health Maintenance Due  Topic Date Due  . COLONOSCOPY  11/15/2017 (Originally 11/24/2006)  . INFLUENZA VACCINE  Completed   Fall Risk  04/06/2017 12/30/2012  Falls in the past year? No No   Functional Status Survey:    Vitals:   10/16/17 1616  BP: (!) 142/82  Pulse: 62    Physical Exam   General this is a pleasant middle-age male in no distress he is well-nourished.  Skin is warm and dry.  Chest he does have some scattered rales but this is baseline there is no labored breathing  Heart is regular rate and rhythm without murmur gallop or rub.  Musculoskeletal moves his extremities at baseline he is status post right  AKA-ambulates well in wheelchair upper extremity strength is very strong.  Neurologic as noted above could not appreciate lateralizing finding his speech is clear.  Psych he is alert and oriented pleasant and appropriate  Labs reviewed: Recent Labs    09/07/17 0630 09/15/17 0755 09/29/17 0710  NA 138 135 138  K 4.7 4.2 4.3  CL 92* 90* 97*  CO2 34* 33* 30  GLUCOSE 99 122* 120*  BUN 36* 43* 46*  CREATININE 1.81* 1.77* 1.86*  CALCIUM 9.4 9.3 9.2   Recent Labs    10/24/16 0212 02/24/17 0727 04/07/17 0700  AST 18 14* 15  ALT 13*  13* 12*  ALKPHOS 51 50 52  BILITOT 0.7 0.6 0.4  PROT 6.8 7.0 7.4  ALBUMIN 4.0 3.9 4.0   Recent Labs    02/24/17 0727 04/07/17 0700 07/07/17 0723 08/19/17 0702  WBC 6.9 6.4 7.2 7.5  NEUTROABS 4.5 4.0 4.6  --   HGB 15.7 15.4 13.7 15.4  HCT 47.7 48.1 46.2 52.8*  MCV 90.7 90.9 82.2 82.5  PLT 149* 161 211 217   Lab Results  Component Value Date   TSH 3.404 04/07/2017   Lab Results  Component Value Date   HGBA1C 5.6 10/24/2016   Lab Results  Component Value Date   CHOL 135 08/19/2017   HDL 31 (L) 08/19/2017   LDLCALC 74 08/19/2017   TRIG 150 (H) 08/19/2017   CHOLHDL 4.4 08/19/2017    Significant Diagnostic Results in last 30 days:  No results found.  Assessment/Plan  #1 hypertension-as noted above patient is quite adamant he would prefer to be on his previous medication doses- I did discuss this with nursing and will reduce his clonidine back to 0.2 mg twice daily and Lopressor back to 12.5 mg twice daily he also continues on Lisinopril 40 mg a day at this point will continue to monitor-this also was discussed with Dr. Lyndel Safe via phone.  [id number]

## 2017-10-18 ENCOUNTER — Encounter: Payer: Self-pay | Admitting: Internal Medicine

## 2017-11-09 ENCOUNTER — Other Ambulatory Visit: Payer: Self-pay

## 2017-11-09 MED ORDER — ALPRAZOLAM 0.5 MG PO TABS: 0.5000 mg | ORAL_TABLET | Freq: Every evening | ORAL | 0 refills | Status: DC | PRN

## 2017-11-09 NOTE — Telephone Encounter (Signed)
RX Fax for [facility]@ [phone]  

## 2017-11-11 ENCOUNTER — Encounter: Payer: Self-pay | Admitting: Internal Medicine

## 2017-11-11 ENCOUNTER — Encounter (HOSPITAL_COMMUNITY)
Admission: RE | Admit: 2017-11-11 | Discharge: 2017-11-11 | Disposition: A | Discharge status: Home / Self Care | Payer: Medicare Other | Source: Skilled Nursing Facility | Attending: *Deleted | Admitting: *Deleted

## 2017-11-11 ENCOUNTER — Non-Acute Institutional Stay (SKILLED_NURSING_FACILITY): Payer: Medicare Other | Admitting: Internal Medicine

## 2017-11-11 ENCOUNTER — Encounter (HOSPITAL_COMMUNITY)
Admission: RE | Admit: 2017-11-11 | Discharge: 2017-11-11 | Disposition: A | Discharge status: Home / Self Care | Payer: Medicare Other | Source: Skilled Nursing Facility | Attending: Internal Medicine | Admitting: Internal Medicine

## 2017-11-11 DIAGNOSIS — D649 Anemia, unspecified: Secondary | ICD-10-CM | POA: Insufficient documentation

## 2017-11-11 DIAGNOSIS — R451 Restlessness and agitation: Secondary | ICD-10-CM

## 2017-11-11 DIAGNOSIS — K259 Gastric ulcer, unspecified as acute or chronic, without hemorrhage or perforation: Secondary | ICD-10-CM | POA: Diagnosis not present

## 2017-11-11 DIAGNOSIS — G4700 Insomnia, unspecified: Secondary | ICD-10-CM | POA: Insufficient documentation

## 2017-11-11 DIAGNOSIS — Z89511 Acquired absence of right leg below knee: Secondary | ICD-10-CM | POA: Diagnosis not present

## 2017-11-11 DIAGNOSIS — Z9181 History of falling: Secondary | ICD-10-CM | POA: Diagnosis not present

## 2017-11-11 DIAGNOSIS — I739 Peripheral vascular disease, unspecified: Secondary | ICD-10-CM | POA: Diagnosis not present

## 2017-11-11 DIAGNOSIS — J449 Chronic obstructive pulmonary disease, unspecified: Secondary | ICD-10-CM | POA: Diagnosis not present

## 2017-11-11 LAB — BASIC METABOLIC PANEL
ANION GAP: 13 (ref 5–15)
BUN: 38 mg/dL — ABNORMAL HIGH (ref 6–20)
CALCIUM: 8.9 mg/dL (ref 8.9–10.3)
CO2: 31 mmol/L (ref 22–32)
CREATININE: 1.71 mg/dL — AB (ref 0.61–1.24)
Chloride: 93 mmol/L — ABNORMAL LOW (ref 101–111)
GFR calc Af Amer: 48 mL/min — ABNORMAL LOW (ref >60)
GFR, EST NON AFRICAN AMERICAN: 42 mL/min — AB (ref >60)
GLUCOSE: 122 mg/dL — AB (ref 65–99)
Potassium: 4 mmol/L (ref 3.5–5.1)
Sodium: 137 mmol/L (ref 135–145)

## 2017-11-11 LAB — URINALYSIS, ROUTINE W REFLEX MICROSCOPIC
Bilirubin Urine: NEGATIVE
Glucose, UA: NEGATIVE mg/dL
Hgb urine dipstick: NEGATIVE
Ketones, ur: NEGATIVE mg/dL
LEUKOCYTES UA: NEGATIVE
Nitrite: NEGATIVE
PROTEIN: NEGATIVE mg/dL
Specific Gravity, Urine: 1.006 (ref 1.005–1.030)
pH: 7 (ref 5.0–8.0)

## 2017-11-11 LAB — RAPID URINE DRUG SCREEN, HOSP PERFORMED
Amphetamines: NOT DETECTED
BENZODIAZEPINES: NOT DETECTED
Barbiturates: NOT DETECTED
Cocaine: NOT DETECTED
Opiates: NOT DETECTED
Tetrahydrocannabinol: NOT DETECTED

## 2017-11-11 LAB — CBC
HCT: 48.7 % (ref 39.0–52.0)
Hemoglobin: 15.1 g/dL (ref 13.0–17.0)
MCH: 25.9 pg — ABNORMAL LOW (ref 26.0–34.0)
MCHC: 31 g/dL (ref 30.0–36.0)
MCV: 83.7 fL (ref 78.0–100.0)
Platelets: 147 10*3/uL — ABNORMAL LOW (ref 150–400)
RBC: 5.82 MIL/uL — ABNORMAL HIGH (ref 4.22–5.81)
RDW: 22.2 % — AB (ref 11.5–15.5)
WBC: 5.5 10*3/uL (ref 4.0–10.5)

## 2017-11-11 NOTE — Progress Notes (Signed)
This is an acute visit.  Level of care skilled.  Facility is [facility].  Chief complaint acute visit secondary to follow-up fall- question increased confusion agitation.  History of present illness.  Patient is a 62 year old male who is a long term resident of facility-.  He has a history of peripheral arterial disease status post right AKA- is on Plavix and aspirin and is followed by vascular.  Also history of carotid stenosis which is relatively asymptomatic.  He also has a history of edema and continues on Lasix this appears to have stabilized.  He also has a history of COPD he continues to smoke continues on Incruse Ellipta and Symbicort  1 of his most significant issues is complaints of diffuse osteoarthritic pain mainly of his shoulders is on oxycodone 15 mg every 4 hours and also has a history of anxiety on Xanax as needed at night 0.5 mg and then he can get an additional 0.25 mg at 2 AM if he is still awake and anxious.  Dr. Lyndel Safe  has discussed trying to titrate down these medications but he becomes quite frustrated with any attempt at this and is adamant he needs these medications at current doses and actually would like them increased   Apparently over the weekend overnight he fell out of bed he states he did not hit his head he did hit his arm on the way down and does have some wrapping on his lower arms.  He does not complain of any pain with this.  Is not complaining of any headache or dizziness.  Apparently last night he had increased agitation with nursing and at one point refused his meds but later demanded them from the nurse-he also apparently wandered into the wrong room at one point.  Speaking with nursing apparently this is behavior is not real unusual and he apparently has these episodes about once a month speaking with various nurses.  Patient also continues to go outside and smoke several times a day-.  His blood pressure is somewhat elevated at  151/78 today we did attempt to increase his clonidine  recently  but he says his blood pressure is usually not that high and usually is elevated after he comes in side after smoking which was the case today.  He does continue on clonidine 0.2 mg twice daily as well as Lopressor 12.5 mg twice daily and Lasix 40 mg a day.  Currently has no complaints he is pleasant and appropriate discussing matters with me today.  He denies taking any medications other than that prescribed here in the facility   Past Medical History:  Diagnosis Date  . Anemia, unspecified   . Arthritis    "shoulders" (09/22/2014)  . Asthma   . Atrial fibrillation ([place])   . Carotid artery occlusion   . CHF (congestive heart failure) ([place])   . COPD 11/27/2007   Qualifier: Diagnosis of  By: Garen Grams    . Coronary artery disease    Cardiac catheterization in 2008 showed 99% stenosis in proximal left circumflex which was treated with Taxus drug-eluting stent. There was mild RCA and LAD disease with normal ejection fraction.  Marland Kitchen ERECTILE DYSFUNCTION 11/27/2007   Qualifier: Diagnosis of  By: Garen Grams    . Gastric ulcer, unspecified as acute or chronic, without mention of hemorrhage, perforation, or obstruction   . Heart murmur   . HYPERLIPIDEMIA 11/27/2007   Qualifier: Diagnosis of  By: Garen Grams    . Hypertension   .  HYPERTENSION, BENIGN 05/23/2009   Qualifier: Diagnosis of  By: Melvyn Novas MD, Christena Deem   . Other severe protein-calorie malnutrition   . PAD (peripheral artery disease) (H[facility])    Previous right SFA stent in 2003. Directional atherectomy right SFA in 01/2013  . Pneumonia 2000  . Pressure ulcer, lower back(707.03)   . Tobacco use   . Traumatic amputation of leg(s) (complete) (partial), unilateral, at or above knee, without mention of complication         Past Surgical History:  Procedure Laterality Date  . ABDOMINAL AORTAGRAM N/A 02/23/2013   Procedure: ABDOMINAL  Maxcine Ham;  Surgeon: Wellington Hampshire, MD;  Location: [place] CATH LAB;  Service: Cardiovascular;  Laterality: N/A;  . ABDOMINAL AORTAGRAM N/A 09/22/2014   Procedure: ABDOMINAL Maxcine Ham;  Surgeon: Elam Dutch, MD;  Location: [facility] CATH LAB;  Service: Cardiovascular;  Laterality: N/A;  . ACNE CYST REMOVAL    . AMPUTATION Right 09/09/2013   Procedure: AMPUTATION ABOVE KNEE;  Surgeon: Rosetta Posner, MD;  Location: [place];  Service: Vascular;  Laterality: Right;  . ATHERECTOMY N/A 03/02/2013   Procedure: ATHERECTOMY;  Surgeon: Wellington Hampshire, MD;  Location: [facility] CATH LAB;  Service: Cardiovascular;  Laterality: N/A;  . CARDIAC CATHETERIZATION N/A 09/03/2015   Procedure: Left Heart Cath and Coronary Angiography;  Surgeon: Leonie Man, MD;  Location: [place] CV LAB;  Service: Cardiovascular;  Laterality: N/A;  . CARDIAC CATHETERIZATION N/A 09/03/2015   Procedure: Coronary Balloon Angioplasty;  Surgeon: Leonie Man, MD;  Location: [place] CV LAB;  Service: Cardiovascular;  Laterality: N/A;  . CORONARY ANGIOPLASTY  09/03/2015  . ENDARTERECTOMY Left 08/16/2013   Procedure: Exploration of Left Neck/Fix Bleeding;  Surgeon: Elam Dutch, MD;  Location: [place];  Service: Vascular;  Laterality: Left;  . ENDARTERECTOMY FEMORAL Left 01/30/2016   Procedure: ENDARTERECTOMY LEFT FEMORAL ARTERY;  Surgeon: Elam Dutch, MD;  Location: [facility] OR;  Service: Vascular;  Laterality: Left;  . ESOPHAGOGASTRODUODENOSCOPY N/A 09/08/2013   Procedure: ESOPHAGOGASTRODUODENOSCOPY (EGD);  Surgeon: Cleotis Nipper, MD;  Location: [facility] ENDOSCOPY;  Service: Endoscopy;  Laterality: N/A;  . ESOPHAGOGASTRODUODENOSCOPY (EGD) WITH PROPOFOL N/A 01/05/2014   Procedure: ESOPHAGOGASTRODUODENOSCOPY (EGD) WITH PROPOFOL;  Surgeon: Cleotis Nipper, MD;  Location: [facility] ENDOSCOPY;  Service: Endoscopy;  Laterality: N/A;  . FEMORAL ARTERY STENT Right 2003   [name] 09/22/2014  . FLEXIBLE SIGMOIDOSCOPY N/A 09/08/2013   Procedure:  FLEXIBLE SIGMOIDOSCOPY;  Surgeon: Cleotis Nipper, MD;  Location: [facility] ENDOSCOPY;  Service: Endoscopy;  Laterality: N/A;  unprepp  . ILIAC ARTERY STENT Left 09/22/2014  . NASAL SINUS SURGERY  2004  . PAROTIDECTOMY Right 03/29/2014   Procedure: PAROTIDECTOMY;  Surgeon: Ascencion Dike, MD;  Location: [place];  Service: ENT;  Laterality: Right;  . PATCH ANGIOPLASTY Left 01/30/2016   Procedure: LEFT FEMORAL ARTYERY PATCH ANGIOPLASTY USING HEMASHIELD PLATINUM FINESSE PATCH;  Surgeon: Elam Dutch, MD;  Location: [place];  Service: Vascular;  Laterality: Left;  . PERIPHERAL VASCULAR CATHETERIZATION N/A 08/17/2015   Procedure: Abdominal Aortogram;  Surgeon: Elam Dutch, MD;  Location: [place] CV LAB;  Service: Cardiovascular;  Laterality: N/A;        Allergies  Allergen Reactions  . Codeine Nausea Only           Medication Sig  . ALPRAZolam (XANAX) 0.25 MG tablet Take 1 tablet (0.25 mg total) by mouth as needed for anxiety. From 06/22/2017-09/22/2016  . ALPRAZolam (XANAX) 0.5 MG tablet Take 1 tablet (0.5 mg total)  by mouth at bedtime as needed for anxiety.  Marland Kitchen aspirin EC 81 MG tablet Take 1 tablet (81 mg total) by mouth daily.  . budesonide-formoterol (SYMBICORT) 80-4.5 MCG/ACT inhaler Inhale 2 puffs into the lungs 2 (two) times daily.  . cloNIDine (CATAPRES) 0.2 MG tablet Take 0.2 mg by mouth 2 (two) times daily.  . clopidogrel (PLAVIX) 75 MG tablet Take 75 mg by mouth daily. @@ 9:00 pm  . colchicine 0.6 MG tablet Take 0.3 mg by mouth daily.   . furosemide (LASIX) 40 MG tablet Take 40 mg by mouth 2 (two) times daily.   . isosorbide mononitrate (IMDUR) 30 MG 24 hr tablet Take 1 tablet (30 mg total) by mouth daily.  Marland Kitchen lisinopril (PRINIVIL,ZESTRIL) 40 MG tablet Take 40 mg by mouth daily. Reported on 11/22/2015  . metoprolol tartrate (LOPRESSOR) 25 MG tablet Take 12.5 mg by mouth 2 (two) times daily.  Marland Kitchen oxyCODONE (ROXICODONE) 15 MG immediate release tablet Take one tablet by mouth every 4  hours as needed for pain HOLD for sedation, resp depression  . pantoprazole (PROTONIX) 40 MG tablet Take 40 mg by mouth daily.  . potassium chloride SA (K-DUR,KLOR-CON) 20 MEQ tablet Take 20 mEq by mouth 2 (two) times daily.   . simvastatin (ZOCOR) 40 MG tablet Take 40 mg by mouth daily.  Marland Kitchen umeclidinium bromide (INCRUSE ELLIPTA) 62.5 MCG/INH AEPB Inhale 1 puff into the lungs daily.    Review of systems.  General is not complaining of any fever chills says he feels well and not any different than usual.  Skin is not complain of rashes or itching does have some apparently small injuries on his lower arms from the fall they are currently wrapped he says they do not hurt.  Head ears eyes nose mouth and throat is not complaining of any headache or visual changes or difficulty swallowing.  Respiratory does not complain of shortness of breath or increased cough beyond baseline he does have a history of COPD continues to smoke.  Cardiac is not complain of chest pain appears to have relatively baseline lower extremity edema on the left.  GI does not complain of abdominal discomfort nausea vomiting diarrhea constipation.  Musculoskeletal does have chronic osteoarthritic pain more so of his shoulders is on oxycodone routinely every 4 hours and quite insistent that he needs it and become somewhat frustrated with any discussion about changing it.  Neurologic does not complain of dizziness headache or syncope.  Psych does not complain of being overtly anxious or depressed says he feels about the same as always.  Physical exam.  Temperature is 98.1 pulse 69 respirations 20 blood pressure 151/78 O2 saturation is in the high 90s on room air.   In general this is a pleasant middle-age male in no distress.  His skin is warm and dry he does have covering over the the injuries on his lower arm he says these are stable and did not her.  Eyes pupils are equal round reactive to light sclera and  conjunctive are clear visual acuity appears to be at baseline.  Oropharynx is clear mucous membranes moist tongue is midline with full range of motion.  Chest he does have some scattered rhonchi which is baseline there is no labored breathing.  Heart is regular rate and rhythm without murmur gallop or rub he has baseline left lower leg extremity edema.  Abdomen is somewhat obese soft nontender with positive bowel sounds.  Musculoskeletal moves all extremities x4 at baseline grip strength is  strong bilaterally left lower extremity strength appears to be intact he is ambulating at baseline in the wheelchair he has right above-the-knee amputation.   Neurologic as noted above No lateralizing findings cranial nerves appear intact his speech is clear.  Tongue is midline with full range of motion-  Psych he is alert and oriented pleasant and appropriate   Labs.  November 11, 2017.  WBC 5.5 hemoglobin 15.1 platelets 214.  Sodium 137 potassium 4 BUN 38 creatinine 1.71   Assessment and plan.  1.-  History of fall with no apparent injury other than some scrapes apparently to his arms- he denies hitting his head or losing consciousness-he says he feels fine.  With possible increased confusion and agitation I discussed the possibility of obtaining a CT of his head to rule out any acute process he was quite adamant he does not want this he said pursuing this would be a waste of "tax payers  money" and that he felt fine.  He denied taking any medications other than that prescribed in the facility-he was agreeable to staff obtaining a urine sample for testing-.    Clinically he appears stable at this point and at baseline mental status however at times apparently nursing is concerned as noted above-again will await urine testing-- blood work obtained earlier today was unremarkable  .  This    situation  and plan of action was discussed with Dr. Lyndel Safe via  phone---  [phone]

## 2017-11-12 ENCOUNTER — Other Ambulatory Visit: Payer: Self-pay

## 2017-11-12 DIAGNOSIS — J449 Chronic obstructive pulmonary disease, unspecified: Secondary | ICD-10-CM | POA: Diagnosis not present

## 2017-11-12 DIAGNOSIS — D649 Anemia, unspecified: Secondary | ICD-10-CM | POA: Diagnosis not present

## 2017-11-12 DIAGNOSIS — I739 Peripheral vascular disease, unspecified: Secondary | ICD-10-CM | POA: Diagnosis not present

## 2017-11-12 DIAGNOSIS — G4700 Insomnia, unspecified: Secondary | ICD-10-CM | POA: Diagnosis not present

## 2017-11-12 DIAGNOSIS — K259 Gastric ulcer, unspecified as acute or chronic, without hemorrhage or perforation: Secondary | ICD-10-CM | POA: Diagnosis not present

## 2017-11-12 DIAGNOSIS — Z89511 Acquired absence of right leg below knee: Secondary | ICD-10-CM | POA: Diagnosis not present

## 2017-11-12 LAB — RAPID URINE DRUG SCREEN, HOSP PERFORMED
AMPHETAMINES: NOT DETECTED
Barbiturates: NOT DETECTED
Benzodiazepines: POSITIVE — AB
Cocaine: NOT DETECTED
OPIATES: NOT DETECTED
Tetrahydrocannabinol: NOT DETECTED

## 2017-11-12 MED ORDER — OXYCODONE HCL 15 MG PO TABS: ORAL_TABLET | ORAL | 0 refills | Status: DC

## 2017-11-12 NOTE — Telephone Encounter (Signed)
RX Fax for [facility]@ [phone]  

## 2017-11-15 ENCOUNTER — Encounter: Payer: Self-pay | Admitting: Internal Medicine

## 2017-11-19 DIAGNOSIS — B351 Tinea unguium: Secondary | ICD-10-CM | POA: Diagnosis not present

## 2017-11-19 DIAGNOSIS — I739 Peripheral vascular disease, unspecified: Secondary | ICD-10-CM | POA: Diagnosis not present

## 2017-11-19 DIAGNOSIS — Z89611 Acquired absence of right leg above knee: Secondary | ICD-10-CM | POA: Diagnosis not present

## 2017-11-24 ENCOUNTER — Encounter: Payer: Self-pay | Admitting: Internal Medicine

## 2017-11-24 ENCOUNTER — Non-Acute Institutional Stay (SKILLED_NURSING_FACILITY): Payer: Medicare Other | Admitting: Internal Medicine

## 2017-11-24 DIAGNOSIS — M19011 Primary osteoarthritis, right shoulder: Secondary | ICD-10-CM

## 2017-11-24 DIAGNOSIS — R451 Restlessness and agitation: Secondary | ICD-10-CM | POA: Diagnosis not present

## 2017-11-24 DIAGNOSIS — I10 Essential (primary) hypertension: Secondary | ICD-10-CM

## 2017-11-24 DIAGNOSIS — I5032 Chronic diastolic (congestive) heart failure: Secondary | ICD-10-CM

## 2017-11-24 DIAGNOSIS — I739 Peripheral vascular disease, unspecified: Secondary | ICD-10-CM | POA: Diagnosis not present

## 2017-11-24 DIAGNOSIS — J42 Unspecified chronic bronchitis: Secondary | ICD-10-CM
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
## 2017-12-01 ENCOUNTER — Encounter (HOSPITAL_COMMUNITY)
Admission: RE | Admit: 2017-12-01 | Discharge: 2017-12-01 | Disposition: A | Discharge status: Home / Self Care | Payer: Medicare Other | Source: Skilled Nursing Facility | Attending: Internal Medicine | Admitting: Internal Medicine

## 2017-12-01 DIAGNOSIS — M10072 Idiopathic gout, left ankle and foot: Secondary | ICD-10-CM | POA: Insufficient documentation

## 2017-12-01 DIAGNOSIS — Z89611 Acquired absence of right leg above knee: Secondary | ICD-10-CM | POA: Insufficient documentation

## 2017-12-01 DIAGNOSIS — J449 Chronic obstructive pulmonary disease, unspecified: Secondary | ICD-10-CM | POA: Insufficient documentation

## 2017-12-01 DIAGNOSIS — G4700 Insomnia, unspecified: Secondary | ICD-10-CM | POA: Insufficient documentation

## 2017-12-01 DIAGNOSIS — I739 Peripheral vascular disease, unspecified: Secondary | ICD-10-CM | POA: Insufficient documentation

## 2017-12-04 LAB — MISC LABCORP TEST (SEND OUT): Labcorp test code: 71538

## 2017-12-08 ENCOUNTER — Encounter: Payer: Self-pay | Admitting: Internal Medicine

## 2017-12-08 NOTE — Progress Notes (Signed)
Location:   [facility] Room Number: 107/W Place of Service:  SNF (31) Provider:  Freddi Starr, MD  Patient Care Team: [name], MD as PCP - General (Internal Medicine)  Extended Emergency Contact Information Primary Emergency Contact: Harvel Quale, [place] [address] [place] Mobile Phone: [phone] Relation: Daughter  Code Status:  Full Code Goals of care: Advanced Directive information Advanced Directives 12/08/2017  Does Patient Have a Medical Advance Directive? Yes  Type of Advance Directive (No Data)  Does patient want to make changes to medical advance directive? No - Patient declined  Copy of [place] in Chart? No - copy requested  Would patient like information on creating a medical advance directive? No - Patient declined  Pre-existing out of facility DNR order (yellow form or pink MOST form) -     Chief Complaint  Patient presents with  . Medical Management of Chronic Issues    Routine Visit    HPI:  Pt is a 62 y.o. male seen today for medical management of chronic diseases.     Past Medical History:  Diagnosis Date  . Anemia, unspecified   . Arthritis    "shoulders" (09/22/2014)  . Asthma   . Atrial fibrillation ([place])   . Carotid artery occlusion   . CHF (congestive heart failure) ([place])   . COPD 11/27/2007   Qualifier: Diagnosis of  By: Garen Grams    . Coronary artery disease    Cardiac catheterization in 2008 showed 99% stenosis in proximal left circumflex which was treated with Taxus drug-eluting stent. There was mild RCA and LAD disease with normal ejection fraction.  Marland Kitchen ERECTILE DYSFUNCTION 11/27/2007   Qualifier: Diagnosis of  By: Garen Grams    . Gastric ulcer, unspecified as acute or chronic, without mention of hemorrhage, perforation, or obstruction   . Heart murmur   . HYPERLIPIDEMIA 11/27/2007   Qualifier: Diagnosis of  By: Garen Grams     . Hypertension   . HYPERTENSION, BENIGN 05/23/2009   Qualifier: Diagnosis of  By: Melvyn Novas MD, Christena Deem   . Other severe protein-calorie malnutrition   . PAD (peripheral artery disease) (HCC)    Previous right SFA stent in 2003. Directional atherectomy right SFA in 01/2013  . Pneumonia 2000  . Pressure ulcer, lower back(707.03)   . Tobacco use   . Traumatic amputation of leg(s) (complete) (partial), unilateral, at or above knee, without mention of complication    Past Surgical History:  Procedure Laterality Date  . ABDOMINAL AORTAGRAM N/A 02/23/2013   Procedure: ABDOMINAL Maxcine Ham;  Surgeon: Wellington Hampshire, MD;  Location: [facility] CATH LAB;  Service: Cardiovascular;  Laterality: N/A;  . ABDOMINAL AORTAGRAM N/A 09/22/2014   Procedure: ABDOMINAL Maxcine Ham;  Surgeon: Elam Dutch, MD;  Location: [facility] CATH LAB;  Service: Cardiovascular;  Laterality: N/A;  . ACNE CYST REMOVAL    . AMPUTATION Right 09/09/2013   Procedure: AMPUTATION ABOVE KNEE;  Surgeon: Rosetta Posner, MD;  Location: [facility];  Service: Vascular;  Laterality: Right;  . ATHERECTOMY N/A 03/02/2013   Procedure: ATHERECTOMY;  Surgeon: Wellington Hampshire, MD;  Location: [facility] CATH LAB;  Service: Cardiovascular;  Laterality: N/A;  . CARDIAC CATHETERIZATION N/A 09/03/2015   Procedure: Left Heart Cath and Coronary Angiography;  Surgeon: Leonie Man, MD;  Location: [facility] CV LAB;  Service: Cardiovascular;  Laterality: N/A;  . CARDIAC CATHETERIZATION N/A 09/03/2015  Procedure: Coronary Balloon Angioplasty;  Surgeon: Leonie Man, MD;  Location: [place] CV LAB;  Service: Cardiovascular;  Laterality: N/A;  . CORONARY ANGIOPLASTY  09/03/2015  . ENDARTERECTOMY Left 08/16/2013   Procedure: Exploration of Left Neck/Fix Bleeding;  Surgeon: Elam Dutch, MD;  Location: [facility];  Service: Vascular;  Laterality: Left;  . ENDARTERECTOMY FEMORAL Left 01/30/2016   Procedure: ENDARTERECTOMY LEFT FEMORAL ARTERY;  Surgeon: Elam Dutch, MD;   Location: [facility] OR;  Service: Vascular;  Laterality: Left;  . ESOPHAGOGASTRODUODENOSCOPY N/A 09/08/2013   Procedure: ESOPHAGOGASTRODUODENOSCOPY (EGD);  Surgeon: Cleotis Nipper, MD;  Location: [facility] ENDOSCOPY;  Service: Endoscopy;  Laterality: N/A;  . ESOPHAGOGASTRODUODENOSCOPY (EGD) WITH PROPOFOL N/A 01/05/2014   Procedure: ESOPHAGOGASTRODUODENOSCOPY (EGD) WITH PROPOFOL;  Surgeon: Cleotis Nipper, MD;  Location: [facility] ENDOSCOPY;  Service: Endoscopy;  Laterality: N/A;  . FEMORAL ARTERY STENT Right 2003   [name] 09/22/2014  . FLEXIBLE SIGMOIDOSCOPY N/A 09/08/2013   Procedure: FLEXIBLE SIGMOIDOSCOPY;  Surgeon: Cleotis Nipper, MD;  Location: [facility] ENDOSCOPY;  Service: Endoscopy;  Laterality: N/A;  unprepp  . ILIAC ARTERY STENT Left 09/22/2014  . NASAL SINUS SURGERY  2004  . PAROTIDECTOMY Right 03/29/2014   Procedure: PAROTIDECTOMY;  Surgeon: Ascencion Dike, MD;  Location: [facility];  Service: ENT;  Laterality: Right;  . PATCH ANGIOPLASTY Left 01/30/2016   Procedure: LEFT FEMORAL ARTYERY PATCH ANGIOPLASTY USING HEMASHIELD PLATINUM FINESSE PATCH;  Surgeon: Elam Dutch, MD;  Location: [facility];  Service: Vascular;  Laterality: Left;  . PERIPHERAL VASCULAR CATHETERIZATION N/A 08/17/2015   Procedure: Abdominal Aortogram;  Surgeon: Elam Dutch, MD;  Location: [facility] CV LAB;  Service: Cardiovascular;  Laterality: N/A;    Allergies  Allergen Reactions  . Codeine Nausea Only    Outpatient Encounter Medications as of 12/08/2017  Medication Sig  . ALPRAZolam (XANAX) 0.5 MG tablet Take 0.5 mg by mouth daily. From 09/16/2017-12/15/2017  . aspirin EC 81 MG tablet Take 1 tablet (81 mg total) by mouth daily.  . budesonide-formoterol (SYMBICORT) 80-4.5 MCG/ACT inhaler Inhale 2 puffs into the lungs 2 (two) times daily.  . cloNIDine (CATAPRES) 0.2 MG tablet Take 0.2 mg by mouth 2 (two) times daily.  . clopidogrel (PLAVIX) 75 MG tablet Take 75 mg by mouth daily. @@ 9:00 pm  . colchicine 0.6 MG tablet Take 0.3 mg by mouth  daily.   . furosemide (LASIX) 40 MG tablet Take 40 mg by mouth 2 (two) times daily.   . isosorbide mononitrate (IMDUR) 30 MG 24 hr tablet Take 1 tablet (30 mg total) by mouth daily.  Marland Kitchen lisinopril (PRINIVIL,ZESTRIL) 40 MG tablet Take 40 mg by mouth daily. Reported on 11/22/2015  . metoprolol tartrate (LOPRESSOR) 25 MG tablet Take 12.5 mg by mouth 2 (two) times daily.  Marland Kitchen oxyCODONE (ROXICODONE) 15 MG immediate release tablet Take 1 tablet by mouth every 8 hours D/T chronic pain  . pantoprazole (PROTONIX) 40 MG tablet Take 40 mg by mouth daily.  . potassium chloride SA (K-DUR,KLOR-CON) 20 MEQ tablet Take 20 mEq by mouth 2 (two) times daily.   . simvastatin (ZOCOR) 40 MG tablet Take 40 mg by mouth daily.  Marland Kitchen umeclidinium bromide (INCRUSE ELLIPTA) 62.5 MCG/INH AEPB Inhale 1 puff into the lungs daily.   No facility-administered encounter medications on file as of 12/08/2017.      Review of Systems  Immunization History  Administered Date(s) Administered  . DTP 05/28/2001  . Influenza Split 04/28/2017  . Influenza-Unspecified 05/26/2013, 05/04/2014, 04/29/2016  . Pneumococcal-Unspecified  07/28/2009, 05/06/2016  . Tdap 12/30/2012   Pertinent  Health Maintenance Due  Topic Date Due  . COLONOSCOPY  12/24/2017 (Originally 11/24/2006)  . INFLUENZA VACCINE  02/25/2018   Fall Risk  04/06/2017 12/30/2012  Falls in the past year? No No   Functional Status Survey:    Vitals:   12/08/17 1233  BP: 139/74  Pulse: 77  Resp: 20  Temp: 97.6 F (36.4 C)  TempSrc: Oral  SpO2: 98%  Weight: 230 lb (104.3 kg)  Height: 5\' 8"  (1.727 m)   Body mass index is 34.97 kg/m. Physical Exam  Labs reviewed: Recent Labs    09/15/17 0755 09/29/17 0710 11/11/17 0825  NA 135 138 137  K 4.2 4.3 4.0  CL 90* 97* 93*  CO2 33* 30 31  GLUCOSE 122* 120* 122*  BUN 43* 46* 38*  CREATININE 1.77* 1.86* 1.71*  CALCIUM 9.3 9.2 8.9   Recent Labs    02/24/17 0727 04/07/17 0700  AST 14* 15  ALT 13* 12*  ALKPHOS  50 52  BILITOT 0.6 0.4  PROT 7.0 7.4  ALBUMIN 3.9 4.0   Recent Labs    02/24/17 0727 04/07/17 0700 07/07/17 0723 08/19/17 0702 11/11/17 0825  WBC 6.9 6.4 7.2 7.5 5.5  NEUTROABS 4.5 4.0 4.6  --   --   HGB 15.7 15.4 13.7 15.4 15.1  HCT 47.7 48.1 46.2 52.8* 48.7  MCV 90.7 90.9 82.2 82.5 83.7  PLT 149* 161 211 217 147*   Lab Results  Component Value Date   TSH 3.404 04/07/2017   Lab Results  Component Value Date   HGBA1C 5.6 10/24/2016   Lab Results  Component Value Date   CHOL 135 08/19/2017   HDL 31 (L) 08/19/2017   LDLCALC 74 08/19/2017   TRIG 150 (H) 08/19/2017   CHOLHDL 4.4 08/19/2017    Significant Diagnostic Results in last 30 days:  No results found.  Assessment/Plan There are no diagnoses linked to this encounter.   Family/ staff Communication:   Labs/tests ordered:      This encounter was created in error - please disregard.

## 2017-12-10 ENCOUNTER — Encounter: Payer: Self-pay | Admitting: Internal Medicine

## 2017-12-10 NOTE — Progress Notes (Signed)
Location:   [facility] Room Number: 107/W Place of Service:  SNF (31) Provider:  Freddi Starr, MD  Patient Care Team: [name], MD as PCP - General (Internal Medicine)  Extended Emergency Contact Information Primary Emergency Contact: Harvel Quale, [place] [address] [place] Mobile Phone: [phone] Relation: Daughter  Code Status:  Full Code Goals of care: Advanced Directive information Advanced Directives 12/10/2017  Does Patient Have a Medical Advance Directive? Yes  Type of Advance Directive (No Data)  Does patient want to make changes to medical advance directive? No - Patient declined  Copy of [place] in Chart? No - copy requested  Would patient like information on creating a medical advance directive? No - Patient declined  Pre-existing out of facility DNR order (yellow form or pink MOST form) -     Chief Complaint  Patient presents with  . Medical Management of Chronic Issues    Patient is being seen for Routine Visit    HPI:  Pt is a 62 y.o. male seen today for medical management of chronic diseases.     Past Medical History:  Diagnosis Date  . Anemia, unspecified   . Arthritis    "shoulders" (09/22/2014)  . Asthma   . Atrial fibrillation ([place])   . Carotid artery occlusion   . CHF (congestive heart failure) ([place])   . COPD 11/27/2007   Qualifier: Diagnosis of  By: Garen Grams    . Coronary artery disease    Cardiac catheterization in 2008 showed 99% stenosis in proximal left circumflex which was treated with Taxus drug-eluting stent. There was mild RCA and LAD disease with normal ejection fraction.  Marland Kitchen ERECTILE DYSFUNCTION 11/27/2007   Qualifier: Diagnosis of  By: Garen Grams    . Gastric ulcer, unspecified as acute or chronic, without mention of hemorrhage, perforation, or obstruction   . Heart murmur   . HYPERLIPIDEMIA 11/27/2007   Qualifier: Diagnosis  of  By: Garen Grams    . Hypertension   . HYPERTENSION, BENIGN 05/23/2009   Qualifier: Diagnosis of  By: Melvyn Novas MD, Christena Deem   . Other severe protein-calorie malnutrition   . PAD (peripheral artery disease) (HCC)    Previous right SFA stent in 2003. Directional atherectomy right SFA in 01/2013  . Pneumonia 2000  . Pressure ulcer, lower back(707.03)   . Tobacco use   . Traumatic amputation of leg(s) (complete) (partial), unilateral, at or above knee, without mention of complication    Past Surgical History:  Procedure Laterality Date  . ABDOMINAL AORTAGRAM N/A 02/23/2013   Procedure: ABDOMINAL Maxcine Ham;  Surgeon: Wellington Hampshire, MD;  Location: [facility] CATH LAB;  Service: Cardiovascular;  Laterality: N/A;  . ABDOMINAL AORTAGRAM N/A 09/22/2014   Procedure: ABDOMINAL Maxcine Ham;  Surgeon: Elam Dutch, MD;  Location[facility] C[facility] LAB;  Service: Cardiovascular;  Laterality: N/A;  . ACNE CYST REMOVAL    . AMPUTATION Right 09/09/2013   Procedure: AMPUTATION ABOVE KNEE;  Surgeon: Rosetta Posner, MD;  Location: [place];  Service: Vascular;  Laterality: Right;  . ATHERECTOMY N/A 03/02/2013   Procedure: ATHERECTOMY;  Surgeon: Wellington Hampshire, MD;  Location: [facility] CATH LAB;  Service: Cardiovascular;  Laterality: N/A;  . CARDIAC CATHETERIZATION N/A 09/03/2015   Procedure: Left Heart Cath and Coronary Angiography;  Surgeon: Leonie Man, MD;  Location: [place] CV LAB;  Service: Cardiovascular;  Laterality: N/A;  .  CARDIAC CATHETERIZATION N/A 09/03/2015   Procedure: Coronary Balloon Angioplasty;  Surgeon: Leonie Man, MD;  Location: [place] CV LAB;  Service: Cardiovascular;  Laterality: N/A;  . CORONARY ANGIOPLASTY  09/03/2015  . ENDARTERECTOMY Left 08/16/2013   Procedure: Exploration of Left Neck/Fix Bleeding;  Surgeon: Elam Dutch, MD;  Location: [place];  Service: Vascular;  Laterality: Left;  . ENDARTERECTOMY FEMORAL Left 01/30/2016   Procedure: ENDARTERECTOMY LEFT FEMORAL ARTERY;  Surgeon:  Elam Dutch, MD;  Location: [facility] OR;  Service: Vascular;  Laterality: Left;  . ESOPHAGOGASTRODUODENOSCOPY N/A 09/08/2013   Procedure: ESOPHAGOGASTRODUODENOSCOPY (EGD);  Surgeon: Cleotis Nipper, MD;  Location: [facility] ENDOSCOPY;  Service: Endoscopy;  Laterality: N/A;  . ESOPHAGOGASTRODUODENOSCOPY (EGD) WITH PROPOFOL N/A 01/05/2014   Procedure: ESOPHAGOGASTRODUODENOSCOPY (EGD) WITH PROPOFOL;  Surgeon: Cleotis Nipper, MD;  Location: [facility] ENDOSCOPY;  Service: Endoscopy;  Laterality: N/A;  . FEMORAL ARTERY STENT Right 2003   [name] 09/22/2014  . FLEXIBLE SIGMOIDOSCOPY N/A 09/08/2013   Procedure: FLEXIBLE SIGMOIDOSCOPY;  Surgeon: Cleotis Nipper, MD;  Location: [facility] ENDOSCOPY;  Service: Endoscopy;  Laterality: N/A;  unprepp  . ILIAC ARTERY STENT Left 09/22/2014  . NASAL SINUS SURGERY  2004  . PAROTIDECTOMY Right 03/29/2014   Procedure: PAROTIDECTOMY;  Surgeon: Ascencion Dike, MD;  Location: [place];  Service: ENT;  Laterality: Right;  . PATCH ANGIOPLASTY Left 01/30/2016   Procedure: LEFT FEMORAL ARTYERY PATCH ANGIOPLASTY USING HEMASHIELD PLATINUM FINESSE PATCH;  Surgeon: Elam Dutch, MD;  Location: [place];  Service: Vascular;  Laterality: Left;  . PERIPHERAL VASCULAR CATHETERIZATION N/A 08/17/2015   Procedure: Abdominal Aortogram;  Surgeon: Elam Dutch, MD;  Location: [place] CV LAB;  Service: Cardiovascular;  Laterality: N/A;    Allergies  Allergen Reactions  . Codeine Nausea Only    Outpatient Encounter Medications as of 12/10/2017  Medication Sig  . ALPRAZolam (XANAX) 0.5 MG tablet Take 0.5 mg by mouth daily. From 09/16/2017-12/15/2017  . aspirin EC 81 MG tablet Take 1 tablet (81 mg total) by mouth daily.  . budesonide-formoterol (SYMBICORT) 80-4.5 MCG/ACT inhaler Inhale 2 puffs into the lungs 2 (two) times daily.  . cloNIDine (CATAPRES) 0.2 MG tablet Take 0.2 mg by mouth 2 (two) times daily.  . clopidogrel (PLAVIX) 75 MG tablet Take 75 mg by mouth daily. @@ 9:00 pm  . colchicine 0.6 MG tablet  Take 0.3 mg by mouth daily.   . furosemide (LASIX) 40 MG tablet Take 40 mg by mouth 2 (two) times daily.   . isosorbide mononitrate (IMDUR) 30 MG 24 hr tablet Take 1 tablet (30 mg total) by mouth daily.  Marland Kitchen lisinopril (PRINIVIL,ZESTRIL) 40 MG tablet Take 40 mg by mouth daily. Reported on 11/22/2015  . metoprolol tartrate (LOPRESSOR) 25 MG tablet Take 12.5 mg by mouth 2 (two) times daily.  Marland Kitchen oxyCODONE (ROXICODONE) 15 MG immediate release tablet Take 1 tablet by mouth every 8 hours D/T chronic pain  . pantoprazole (PROTONIX) 40 MG tablet Take 40 mg by mouth daily.  . potassium chloride SA (K-DUR,KLOR-CON) 20 MEQ tablet Take 20 mEq by mouth 2 (two) times daily.   . simvastatin (ZOCOR) 40 MG tablet Take 40 mg by mouth daily.  Marland Kitchen umeclidinium bromide (INCRUSE ELLIPTA) 62.5 MCG/INH AEPB Inhale 1 puff into the lungs daily.   No facility-administered encounter medications on file as of 12/10/2017.      Review of Systems  Immunization History  Administered Date(s) Administered  . DTP 05/28/2001  . Influenza Split 04/28/2017  . Influenza-Unspecified  05/26/2013, 05/04/2014, 04/29/2016  . Pneumococcal-Unspecified 07/28/2009, 05/06/2016  . Tdap 12/30/2012   Pertinent  Health Maintenance Due  Topic Date Due  . COLONOSCOPY  12/24/2017 (Originally 11/24/2006)  . INFLUENZA VACCINE  02/25/2018   Fall Risk  04/06/2017 12/30/2012  Falls in the past year? No No   Functional Status Survey:    Vitals:   12/10/17 1242  BP: 139/74  Pulse: 77  Resp: 20  Temp: 97.6 F (36.4 C)  TempSrc: Oral  SpO2: 98%  Weight: 230 lb (104.3 kg)  Height: 5\' 8"  (1.727 m)   Body mass index is 34.97 kg/m. Physical Exam  Labs reviewed: Recent Labs    09/15/17 0755 09/29/17 0710 11/11/17 0825  NA 135 138 137  K 4.2 4.3 4.0  CL 90* 97* 93*  CO2 33* 30 31  GLUCOSE 122* 120* 122*  BUN 43* 46* 38*  CREATININE 1.77* 1.86* 1.71*  CALCIUM 9.3 9.2 8.9   Recent Labs    02/24/17 0727 04/07/17 0700  AST 14* 15    ALT 13* 12*  ALKPHOS 50 52  BILITOT 0.6 0.4  PROT 7.0 7.4  ALBUMIN 3.9 4.0   Recent Labs    02/24/17 0727 04/07/17 0700 07/07/17 0723 08/19/17 0702 11/11/17 0825  WBC 6.9 6.4 7.2 7.5 5.5  NEUTROABS 4.5 4.0 4.6  --   --   HGB 15.7 15.4 13.7 15.4 15.1  HCT 47.7 48.1 46.2 52.8* 48.7  MCV 90.7 90.9 82.2 82.5 83.7  PLT 149* 161 211 217 147*   Lab Results  Component Value Date   TSH 3.404 04/07/2017   Lab Results  Component Value Date   HGBA1C 5.6 10/24/2016   Lab Results  Component Value Date   CHOL 135 08/19/2017   HDL 31 (L) 08/19/2017   LDLCALC 74 08/19/2017   TRIG 150 (H) 08/19/2017   CHOLHDL 4.4 08/19/2017    Significant Diagnostic Results in last 30 days:  No results found.  Assessment/Plan There are no diagnoses linked to this encounter.   Family/ staff Communication:   Labs/tests ordered:      This encounter was created in error - please disregard.

## 2017-12-11 ENCOUNTER — Encounter: Payer: Self-pay | Admitting: Internal Medicine

## 2017-12-11 ENCOUNTER — Non-Acute Institutional Stay (SKILLED_NURSING_FACILITY): Payer: Medicare Other | Admitting: Internal Medicine

## 2017-12-11 DIAGNOSIS — I10 Essential (primary) hypertension: Secondary | ICD-10-CM

## 2017-12-11 DIAGNOSIS — R52 Pain, unspecified: Secondary | ICD-10-CM | POA: Diagnosis not present

## 2017-12-11 DIAGNOSIS — M159 Polyosteoarthritis, unspecified: Secondary | ICD-10-CM

## 2017-12-11 DIAGNOSIS — J42 Unspecified chronic bronchitis: Secondary | ICD-10-CM | POA: Diagnosis not present

## 2017-12-11 NOTE — Progress Notes (Signed)
Location:   [facility] Room Number: 107/W Place of Service:  SNF [phone]) Provider:  Freddi Starr, MD  Patient Care Team: [name], MD as PCP - General (Internal Medicine)  Extended Emergency Contact Information Primary Emergency Contact: [place], [place] [address] [place] Mobile Phone: [phone] Relation: Daughter  Code Status:  Full Code Goals of care: Advanced Directive information Advanced Directives 12/11/2017  Does Patient Have a Medical Advance Directive? Yes  Type of Advance Directive (No Data)  Does patient want to make changes to medical advance directive? No - Patient declined  Copy of [place] in Chart? No - copy requested  Would patient like information on creating a medical advance directive? No - Patient declined  Pre-existing out of facility DNR order (yellow form or pink MOST form) -     Chief Complaint  Patient presents with  . Acute Visit    Patient being seen for Pain Management    HPI:  Pt is a 62 y.o. male seen today for an acute visit for request to have his pain medication frequency increased.  Patient has a history of COPD as well as diastolic CHF peripheral arterial disease status post right above-the-knee amputation.  He also has a history of diffuse osteoarthritis especially of his right shoulder.--- Per orthopedics this has severe degenerative changes with bone-on-bone opposition  He had been on oxycodone 15 mg every 6 hours--but apparently has some increased restlessness and agitation at one point and a urine  drug screen was done e which actually did not show evidence of oxycodone.  This was evaluated by Dr. Lyndel Safe and his oxycodone was reduced to every 8 hours--  Patient continued to complain of pain and would like it increased back to every 6 hours-subsequently  blood test was done which did show evidence of oxycodone-- Patient is been quite  adamant that he needs the oxycodone every 6 hours because of his pain.  This was discussed with Dr. Babs Bertin and she is agreeable to increase this back to every 6 hours  Patient also continues on Xanax 0.5 mg once a day at 10 PM at one point he had a later dose as needed schedule but this is been discontinued.  Currently his main complaint is arthritic pain more so of his right shoulder he says the every 6 hour dosing was more effective than every 8.  He continues to vehemently deny any sedation concerns  Or  that the medicine is causing  any increased restlessness or agitation  He also has a history of COPD and continues to smoke- he is on Symbicort twice a day as well as Incruse Ellipta every morning.  He says this helps.  He says that earlier this week he did cough up some yellowish phlegm- but he says this has gotten better-he is not complaining of any increased shortness of breath or cough.  I did discuss more aggressive interventions including a chest x-ray but he says he does not really want that at this time-he says this is getting better but that he will monitor it    other medical issues include chronic diastolic CHF he is on Lasix.  Chronic kidney disease as well as coronary artery disease hypertension--insomnia and gout   In general is not complaining of any fever or chills.  Skin       Past Medical History:  Diagnosis Date  .  Anemia, unspecified   . Arthritis    "shoulders" (09/22/2014)  . Asthma   . Atrial fibrillation ([facility])   . Carotid artery occlusion   . CHF (congestive heart failure) ([facility])   . COPD 11/27/2007   Qualifier: Diagnosis of  By: Garen Grams    . Coronary artery disease    Cardiac catheterization in 2008 showed 99% stenosis in proximal left circumflex which was treated with Taxus drug-eluting stent. There was mild RCA and LAD disease with normal ejection fraction.  Marland Kitchen ERECTILE DYSFUNCTION 11/27/2007   Qualifier: Diagnosis of  By: Garen Grams    . Gastric ulcer, unspecified as acute or chronic, without mention of hemorrhage, perforation, or obstruction   . Heart murmur   . HYPERLIPIDEMIA 11/27/2007   Qualifier: Diagnosis of  By: Garen Grams    . Hypertension   . HYPERTENSION, BENIGN 05/23/2009   Qualifier: Diagnosis of  By: Melvyn Novas MD, Christena Deem   . Other severe protein-calorie malnutrition   . PAD (peripheral artery disease) (H[facility])    Previous right SFA stent in 2003. Directional atherectomy right SFA in 01/2013  . Pneumonia 2000  . Pressure ulcer, lower back(707.03)   . Tobacco use   . Traumatic amputation of leg(s) (complete) (partial), unilateral, at or above knee, without mention of complication    Past Surgical History:  Procedure Laterality Date  . ABDOMINAL AORTAGRAM N/A 02/23/2013   Procedure: ABDOMINAL Maxcine Ham;  Surgeon: Wellington Hampshire, MD;  Location: [facility] CATH LAB;  Service: Cardiovascular;  Laterality: N/A;  . ABDOMINAL AORTAGRAM N/A 09/22/2014   Procedure: ABDOMINAL Maxcine Ham;  Surgeon: Elam Dutch, MD;  Location: [facility] CATH LAB;  Service: Cardiovascular;  Laterality: N/A;  . ACNE CYST REMOVAL    . AMPUTATION Right 09/09/2013   Procedure: AMPUTATION ABOVE KNEE;  Surgeon: Rosetta Posner, MD;  Location: [place] [place];  Service: Vascular;  Laterality: Right;  . ATHERECTOMY N/A 03/02/2013   Procedure: ATHERECTOMY;  Surgeon: Wellington Hampshire, MD;  Location: [facility] CATH LAB;  Service: Cardiovascular;  Laterality: N/A;  . CARDIAC CATHETERIZATION N/A 09/03/2015   Procedure: Left Heart Cath and Coronary Angiography;  Surgeon: Leonie Man, MD;  Location: [place] [facility] CV LAB;  Service: Cardiovascular;  Laterality: N/A;  . CARDIAC CATHETERIZATION N/A 09/03/2015   Procedure: Coronary Balloon Angioplasty;  Surgeon: Leonie Man, MD;  Location: [facility] CV LAB;  Service: Cardiovascular;  Laterality: N/A;  . CORONARY ANGIOPLASTY  09/03/2015  . ENDARTERECTOMY Left 08/16/2013   Procedure: Exploration of Left Neck/Fix Bleeding;   Surgeon: Elam Dutch, MD;  Location: [facility];  Service: Vascular;  Laterality: Left;  . ENDARTERECTOMY FEMORAL Left 01/30/2016   Procedure: ENDARTERECTOMY LEFT FEMORAL ARTERY;  Surgeon: Elam Dutch, MD;  Location[facility] OR;  Service: Vascular;  Laterality: Left;  . ESOPHAGOGASTRODUODENOSCOPY N/A 09/08/2013   Procedure: ESOPHAGOGASTRODUODENOSCOPY (EGD);  Surgeon: Cleotis Nipper, MD;  Location: [facility] ENDOSCOPY;  Service: Endoscopy;  Laterality: N/A;  . ESOPHAGOGASTRODUODENOSCOPY (EGD) WITH PROPOFOL N/A 01/05/2014   Procedure: ESOPHAGOGASTRODUODENOSCOPY (EGD) WITH PROPOFOL;  Surgeon: Cleotis Nipper, MD;  Location: [facility] ENDOSCOPY;  Service: Endoscopy;  Laterality: N/A;  . FEMORAL ARTERY STENT Right 2003   [name] 09/22/2014  . FLEXIBLE SIGMOIDOSCOPY N/A 09/08/2013   Procedure: FLEXIBLE SIGMOIDOSCOPY;  Surgeon: Cleotis Nipper, MD;  Location: [facility] ENDOSCOPY;  Service: Endoscopy;  Laterality: N/A;  unprepp  . ILIAC ARTERY STENT Left 09/22/2014  . NASAL SINUS SURGERY  2004  . PAROTIDECTOMY Right 03/29/2014   Procedure: PAROTIDECTOMY;  Surgeon:  Ascencion Dike, MD;  Location: [place];  Service: ENT;  Laterality: Right;  . PATCH ANGIOPLASTY Left 01/30/2016   Procedure: LEFT FEMORAL ARTYERY PATCH ANGIOPLASTY USING HEMASHIELD PLATINUM FINESSE PATCH;  Surgeon: Elam Dutch, MD;  Location: [place];  Service: Vascular;  Laterality: Left;  . PERIPHERAL VASCULAR CATHETERIZATION N/A 08/17/2015   Procedure: Abdominal Aortogram;  Surgeon: Elam Dutch, MD;  Location: [facility] [facility] CV LAB;  Service: Cardiovascular;  Laterality: N/A;    Allergies  Allergen Reactions  . Codeine Nausea Only    Outpatient Encounter Medications as of 12/11/2017  Medication Sig  . ALPRAZolam (XANAX) 0.5 MG tablet Take 0.5 mg by mouth daily. From 09/16/2017-12/15/2017  . aspirin EC 81 MG tablet Take 1 tablet (81 mg total) by mouth daily.  . budesonide-formoterol (SYMBICORT) 80-4.5 MCG/ACT inhaler Inhale 2 puffs into the lungs 2 (two) times  daily.  . cloNIDine (CATAPRES) 0.2 MG tablet Take 0.2 mg by mouth 2 (two) times daily.  . clopidogrel (PLAVIX) 75 MG tablet Take 75 mg by mouth daily. @@ 9:00 pm  . colchicine 0.6 MG tablet Take 0.3 mg by mouth daily.   . furosemide (LASIX) 40 MG tablet Take 40 mg by mouth 2 (two) times daily.   . isosorbide mononitrate (IMDUR) 30 MG 24 hr tablet Take 1 tablet (30 mg total) by mouth daily.  Marland Kitchen lisinopril (PRINIVIL,ZESTRIL) 40 MG tablet Take 40 mg by mouth daily. Reported on 11/22/2015  . metoprolol tartrate (LOPRESSOR) 25 MG tablet Take 12.5 mg by mouth 2 (two) times daily.  Marland Kitchen oxyCODONE (ROXICODONE) 15 MG immediate release tablet Take 1 tablet by mouth every 8 hours D/T chronic pain  . pantoprazole (PROTONIX) 40 MG tablet Take 40 mg by mouth daily.  . potassium chloride SA (K-DUR,KLOR-CON) 20 MEQ tablet Take 20 mEq by mouth 2 (two) times daily.   . simvastatin (ZOCOR) 40 MG tablet Take 40 mg by mouth daily.  Marland Kitchen umeclidinium bromide (INCRUSE ELLIPTA) 62.5 MCG/INH AEPB Inhale 1 puff into the lungs daily.   No facility-administered encounter medications on file as of 12/11/2017.     Review of Systems   Does not complain of rashes or itching does at times have a rash on his left leg.  Head ears eyes nose mouth and throat is not complaining of any visual changes or sore throat sent at one point he was coughing up some yellowish phlegm but this has improved.  Respiratory is not complaining of shortness of breath or increased cough from baseline.  Cardiac is not complaining of chest pain has some mild lower extremity edema at baseline.  GI does not complain of abdominal discomfort nausea vomiting diarrhea constipation.  Musculoskeletal he needs to complain of pain or so of his right shoulder but also has somewhat more diffuse pain complaints as well and joints.  Neurologic is not complaint dizziness headache or numbness.  Psych does have some history of anxiety is on Xanax at night also does  complain of insomnia at times and again does have the Xanax  HS  Immunization History  Administered Date(s) Administered  . DTP 05/28/2001  . Influenza Split 04/28/2017  . Influenza-Unspecified 05/26/2013, 05/04/2014, 04/29/2016  . Pneumococcal-Unspecified 07/28/2009, 05/06/2016  . Tdap 12/30/2012   Pertinent  Health Maintenance Due  Topic Date Due  . COLONOSCOPY  12/24/2017 (Originally 11/24/2006)  . INFLUENZA VACCINE  02/25/2018   Fall Risk  04/06/2017 12/30/2012  Falls in the past year? No No   Functional Status Survey:  Vitals:   12/11/17 1131  BP: 126/79  Pulse: 78  Resp: 20  Temp: 98.8 F (37.1 C)  TempSrc: Oral    Physical Exam   In general is a pleasant middle-age male in no distress sitting comfortably in his wheelchair.  His skin is warm and dry continues to have some chronic erythema of his left leg and appears to be more sun exposure related and baseline.  Eyes visual acuity appears grossly intact sclera and conjunctive are clear.  Chest continues to have a small amount of diffuse rhonchi this is not different than baseline-there is no labored breathing.  Heart is regular rate and rhythm with somewhat distant heart sounds he has mild lower extremity edema at baseline on his left leg again he is status post right above-the-knee amputation.  Abdomen is obese soft nontender with positive bowel sounds.  Musculoskeletal is status post right above-the-knee amputation ambulates well and is wheelchair- has limited range of motion of his right shoulder.  Neurologic is grossly intact speech is clear could not really appreciate lateralizing findings.  Psych he is alert and oriented pleasant and appropriate  Labs reviewed: Recent Labs    09/15/17 0755 09/29/17 0710 11/11/17 0825  NA 135 138 137  K 4.2 4.3 4.0  CL 90* 97* 93*  CO2 33* 30 31  GLUCOSE 122* 120* 122*  BUN 43* 46* 38*  CREATININE 1.77* 1.86* 1.71*  CALCIUM 9.3 9.2 8.9   Recent Labs     02/24/17 0727 04/07/17 0700  AST 14* 15  ALT 13* 12*  ALKPHOS 50 52  BILITOT 0.6 0.4  PROT 7.0 7.4  ALBUMIN 3.9 4.0   Recent Labs    02/24/17 0727 04/07/17 0700 07/07/17 0723 08/19/17 0702 11/11/17 0825  WBC 6.9 6.4 7.2 7.5 5.5  NEUTROABS 4.5 4.0 4.6  --   --   HGB 15.7 15.4 13.7 15.4 15.1  HCT 47.7 48.1 46.2 52.8* 48.7  MCV 90.7 90.9 82.2 82.5 83.7  PLT 149* 161 211 217 147*   Lab Results  Component Value Date   TSH 3.404 04/07/2017   Lab Results  Component Value Date   HGBA1C 5.6 10/24/2016   Lab Results  Component Value Date   CHOL 135 08/19/2017   HDL 31 (L) 08/19/2017   LDLCALC 74 08/19/2017   TRIG 150 (H) 08/19/2017   CHOLHDL 4.4 08/19/2017    Significant Diagnostic Results in last 30 days:  No results found.  Assessment/Plan  #1- history of osteoarthritic pain-patient has an extensive history as noted above he is quite adamant the oxycodone 15 mg every 8 hours is not totally effective-he feels when it was every 6 hours this was improved- this previously has been discussed with Dr. Ezra Sites increase back to every 6 hours --hold for any sedation or respiratory depression-- Againblood test did show he does have oxycodone in his system  At this point will monitor would be very hesitant however to increase the frequency or dosage further-.  2.  History of COPD he continues to smoke he is on Symbicort and Incruse Ellipta reports a short history of coughing up mainly yellow phlegm earlier this week but he says this has improved-at this point he would like to defer aggressive treatment including an x-ray and monitor.  3.  Hypertension-this appears relatively stabilized on clonidine 0.2 mg twice daily lisinopril 40 mg a day and Lopressor 12.5 mg twice daily at this point will monitor.  [id number]

## 2017-12-16 ENCOUNTER — Other Ambulatory Visit: Payer: Self-pay

## 2017-12-16 ENCOUNTER — Encounter: Payer: Self-pay | Admitting: Internal Medicine

## 2017-12-16 ENCOUNTER — Non-Acute Institutional Stay (SKILLED_NURSING_FACILITY): Payer: Medicare Other | Admitting: Internal Medicine

## 2017-12-16 DIAGNOSIS — I5032 Chronic diastolic (congestive) heart failure: Secondary | ICD-10-CM

## 2017-12-16 DIAGNOSIS — I739 Peripheral vascular disease, unspecified: Secondary | ICD-10-CM | POA: Diagnosis not present

## 2017-12-16 DIAGNOSIS — I2510 Atherosclerotic heart disease of native coronary artery without angina pectoris: Secondary | ICD-10-CM | POA: Diagnosis not present

## 2017-12-16 DIAGNOSIS — J42 Unspecified chronic bronchitis: Secondary | ICD-10-CM | POA: Diagnosis not present

## 2017-12-16 DIAGNOSIS — Z9861 Coronary angioplasty status: Secondary | ICD-10-CM

## 2017-12-16 DIAGNOSIS — M19011 Primary osteoarthritis, right shoulder: Secondary | ICD-10-CM | POA: Diagnosis not present

## 2017-12-16 DIAGNOSIS — N289 Disorder of kidney and ureter, unspecified: Secondary | ICD-10-CM

## 2017-12-16 MED ORDER — ALPRAZOLAM 0.5 MG PO TABS: 0.5000 mg | ORAL_TABLET | Freq: Every day | ORAL | 0 refills | Status: DC
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
## 2017-12-16 NOTE — Telephone Encounter (Signed)
RX Fax for [facility]@ [phone]  

## 2017-12-17 ENCOUNTER — Encounter (HOSPITAL_COMMUNITY)
Admission: RE | Admit: 2017-12-17 | Discharge: 2017-12-17 | Disposition: A | Discharge status: Home / Self Care | Payer: Medicare Other | Source: Skilled Nursing Facility | Attending: Internal Medicine | Admitting: Internal Medicine

## 2017-12-17 DIAGNOSIS — I739 Peripheral vascular disease, unspecified: Secondary | ICD-10-CM | POA: Insufficient documentation

## 2017-12-17 DIAGNOSIS — J449 Chronic obstructive pulmonary disease, unspecified: Secondary | ICD-10-CM | POA: Diagnosis not present

## 2017-12-17 DIAGNOSIS — Z89611 Acquired absence of right leg above knee: Secondary | ICD-10-CM | POA: Diagnosis not present

## 2017-12-17 DIAGNOSIS — G4700 Insomnia, unspecified: Secondary | ICD-10-CM | POA: Insufficient documentation

## 2017-12-17 DIAGNOSIS — M10072 Idiopathic gout, left ankle and foot: Secondary | ICD-10-CM | POA: Insufficient documentation

## 2017-12-17 DIAGNOSIS — D649 Anemia, unspecified: Secondary | ICD-10-CM | POA: Insufficient documentation

## 2017-12-17 LAB — BASIC METABOLIC PANEL
Anion gap: 9 (ref 5–15)
BUN: 46 mg/dL — ABNORMAL HIGH (ref 6–20)
CALCIUM: 8.7 mg/dL — AB (ref 8.9–10.3)
CO2: 33 mmol/L — AB (ref 22–32)
Chloride: 90 mmol/L — ABNORMAL LOW (ref 101–111)
Creatinine, Ser: 1.92 mg/dL — ABNORMAL HIGH (ref 0.61–1.24)
GFR calc non Af Amer: 36 mL/min — ABNORMAL LOW (ref >60)
GFR, EST AFRICAN AMERICAN: 42 mL/min — AB (ref >60)
Glucose, Bld: 120 mg/dL — ABNORMAL HIGH (ref 65–99)
Potassium: 4.6 mmol/L (ref 3.5–5.1)
SODIUM: 132 mmol/L — AB (ref 135–145)

## 2017-12-22 ENCOUNTER — Other Ambulatory Visit: Payer: Self-pay

## 2017-12-22 MED ORDER — OXYCODONE HCL 15 MG PO TABS: 15.0000 mg | ORAL_TABLET | Freq: Four times a day (QID) | ORAL | 0 refills | Status: DC | PRN

## 2017-12-22 NOTE — Telephone Encounter (Signed)
RX Fax for [facility]@ [phone]  

## 2018-01-07 ENCOUNTER — Other Ambulatory Visit: Payer: Self-pay

## 2018-01-07 MED ORDER — OXYCODONE HCL 15 MG PO TABS: 15.0000 mg | ORAL_TABLET | Freq: Four times a day (QID) | ORAL | 0 refills | Status: DC | PRN

## 2018-01-07 NOTE — Telephone Encounter (Signed)
RX Fax for [facility]@ [phone]  

## 2018-01-15 ENCOUNTER — Other Ambulatory Visit: Payer: Self-pay

## 2018-01-15 MED ORDER — ALPRAZOLAM 0.5 MG PO TABS: 0.5000 mg | ORAL_TABLET | Freq: Every day | ORAL | 0 refills | Status: DC

## 2018-01-15 NOTE — Telephone Encounter (Signed)
RX Fax for [facility]@ [phone]  

## 2018-02-11 ENCOUNTER — Encounter: Payer: Self-pay | Admitting: Internal Medicine

## 2018-02-11 ENCOUNTER — Other Ambulatory Visit: Payer: Self-pay

## 2018-02-11 MED ORDER — OXYCODONE HCL 15 MG PO TABS: 15.0000 mg | ORAL_TABLET | Freq: Four times a day (QID) | ORAL | 0 refills | Status: DC | PRN

## 2018-02-11 MED ORDER — ALPRAZOLAM 0.5 MG PO TABS: 0.5000 mg | ORAL_TABLET | Freq: Every day | ORAL | 0 refills | Status: AC

## 2018-02-11 NOTE — Progress Notes (Deleted)
Location:   [place] Room Number: 107/W Place of Service:  SNF [phone]) Provider:  Veleta Miners MD  [name], MD  Patient Care Team: [name], MD as PCP - General (Internal Medicine)  Extended Emergency Contact Information Primary Emergency Contact: Harvel Quale, [place] [address] Johnnette Litter of [place] Mobile Phone: [phone] Relation: Daughter  Code Status:  Full Code Goals of care: Advanced Directive information Advanced Directives 02/11/2018  Does Patient Have a Medical Advance Directive? Yes  Type of Advance Directive (No Data)  Does patient want to make changes to medical advance directive? No - Patient declined  Copy of [place] in Chart? No - copy requested  Would patient like information on creating a medical advance directive? No - Patient declined  Pre-existing out of facility DNR order (yellow form or pink MOST form) -     Chief Complaint  Patient presents with  . Medical Management of Chronic Issues    Patient is being for a Routine Visit for Medical Management Patient due Colonoscopy    HPI:  Pt is a 62 y.o. male seen today for medical management of chronic diseases.    Patient has H/O Severe Peripheral Artery disease,S/P Right AKA. S/P Left femoral Endarterectomy 02/09/16 , Hypertension , CAD s/p PTCA, CHF, Hyperlipidemia, COPD, Continuous to smoke, Carotidstenosis. And Arthritis on Chronic Narcotics.   Past Medical History:  Diagnosis Date  . Anemia, unspecified   . Arthritis    "shoulders" (09/22/2014)  . Asthma   . Atrial fibrillation ([place])   . Carotid artery occlusion   . CHF (congestive heart failure) ([place])   . COPD 11/27/2007   Qualifier: Diagnosis of  By: Garen Grams    . Coronary artery disease    Cardiac catheterization in 2008 showed 99% stenosis in proximal left circumflex which was treated with Taxus drug-eluting stent. There was mild RCA and LAD disease with  normal ejection fraction.  Marland Kitchen ERECTILE DYSFUNCTION 11/27/2007   Qualifier: Diagnosis of  By: Garen Grams    . Gastric ulcer, unspecified as acute or chronic, without mention of hemorrhage, perforation, or obstruction   . Heart murmur   . HYPERLIPIDEMIA 11/27/2007   Qualifier: Diagnosis of  By: Garen Grams    . Hypertension   . HYPERTENSION, BENIGN 05/23/2009   Qualifier: Diagnosis of  By: Melvyn Novas MD, Christena Deem   . Other severe protein-calorie malnutrition   . PAD (peripheral artery disease) (H[facility])    Previous right SFA stent in 2003. Directional atherectomy right SFA in 01/2013  . Pneumonia 2000  . Pressure ulcer, lower back(707.03)   . Tobacco use   . Traumatic amputation of leg(s) (complete) (partial), unilateral, at or above knee, without mention of complication    Past Surgical History:  Procedure Laterality Date  . ABDOMINAL AORTAGRAM N/A 02/23/2013   Procedure: ABDOMINAL Maxcine Ham;  Surgeon: Wellington Hampshire, MD;  Location: [facility] CATH LAB;  Service: Cardiovascular;  Laterality: N/A;  . ABDOMINAL AORTAGRAM N/A 09/22/2014   Procedure: ABDOMINAL Maxcine Ham;  Surgeon: Elam Dutch, MD;  Location: [facility] CATH LAB;  Service: Cardiovascular;  Laterality: N/A;  . ACNE CYST REMOVAL    . AMPUTATION Right 09/09/2013   Procedure: AMPUTATION ABOVE KNEE;  Surgeon: Rosetta Posner, MD;  Location: [facility];  Service: Vascular;  Laterality: Right;  . ATHERECTOMY N/A 03/02/2013   Procedure: ATHERECTOMY;  Surgeon: Wellington Hampshire, MD;  Location: [facility] CATH LAB;  Service: Cardiovascular;  Laterality: N/A;  . CARDIAC CATHETERIZATION N/A 09/03/2015   Procedure: Left Heart Cath and Coronary Angiography;  Surgeon: Leonie Man, MD;  Location: [facility] CV LAB;  Service: Cardiovascular;  Laterality: N/A;  . CARDIAC CATHETERIZATION N/A 09/03/2015   Procedure: Coronary Balloon Angioplasty;  Surgeon: Leonie Man, MD;  Location: [facility] CV LAB;  Service: Cardiovascular;  Laterality: N/A;  . CORONARY ANGIOPLASTY   09/03/2015  . ENDARTERECTOMY Left 08/16/2013   Procedure: Exploration of Left Neck/Fix Bleeding;  Surgeon: Elam Dutch, MD;  Location: [facility];  Service: Vascular;  Laterality: Left;  . ENDARTERECTOMY FEMORAL Left 01/30/2016   Procedure: ENDARTERECTOMY LEFT FEMORAL ARTERY;  Surgeon: Elam Dutch, MD;  Location: [facility] OR;  Service: Vascular;  Laterality: Left;  . ESOPHAGOGASTRODUODENOSCOPY N/A 09/08/2013   Procedure: ESOPHAGOGASTRODUODENOSCOPY (EGD);  Surgeon: Cleotis Nipper, MD;  Location: [facility] ENDOSCOPY;  Service: Endoscopy;  Laterality: N/A;  . ESOPHAGOGASTRODUODENOSCOPY (EGD) WITH PROPOFOL N/A 01/05/2014   Procedure: ESOPHAGOGASTRODUODENOSCOPY (EGD) WITH PROPOFOL;  Surgeon: Cleotis Nipper, MD;  Location: [facility] ENDOSCOPY;  Service: Endoscopy;  Laterality: N/A;  . FEMORAL ARTERY STENT Right 2003   [name] 09/22/2014  . FLEXIBLE SIGMOIDOSCOPY N/A 09/08/2013   Procedure: FLEXIBLE SIGMOIDOSCOPY;  Surgeon: Cleotis Nipper, MD;  Location: [facility] ENDOSCOPY;  Service: Endoscopy;  Laterality: N/A;  unprepp  . ILIAC ARTERY STENT Left 09/22/2014  . NASAL SINUS SURGERY  2004  . PAROTIDECTOMY Right 03/29/2014   Procedure: PAROTIDECTOMY;  Surgeon: Ascencion Dike, MD;  Location: [facility];  Service: ENT;  Laterality: Right;  . PATCH ANGIOPLASTY Left 01/30/2016   Procedure: LEFT FEMORAL ARTYERY PATCH ANGIOPLASTY USING HEMASHIELD PLATINUM FINESSE PATCH;  Surgeon: Elam Dutch, MD;  Location: [facility];  Service: Vascular;  Laterality: Left;  . PERIPHERAL VASCULAR CATHETERIZATION N/A 08/17/2015   Procedure: Abdominal Aortogram;  Surgeon: Elam Dutch, MD;  Location: [facility] CV LAB;  Service: Cardiovascular;  Laterality: N/A;    Allergies  Allergen Reactions  . Codeine Nausea Only    Outpatient Encounter Medications as of 02/11/2018  Medication Sig  . ALPRAZolam (XANAX) 0.5 MG tablet Take 1 tablet (0.5 mg total) by mouth daily. From 10 PM X 90 days  . aspirin EC 81 MG tablet Take 1 tablet (81 mg total) by mouth  daily.  . budesonide-formoterol (SYMBICORT) 80-4.5 MCG/ACT inhaler Inhale 2 puffs into the lungs 2 (two) times daily.  . cloNIDine (CATAPRES) 0.2 MG tablet Take 0.2 mg by mouth 2 (two) times daily.  . clopidogrel (PLAVIX) 75 MG tablet Take 75 mg by mouth daily. @@ 9:00 pm  . colchicine 0.6 MG tablet Take 0.3 mg by mouth daily.   . furosemide (LASIX) 40 MG tablet Take 40 mg by mouth 2 (two) times daily.   . isosorbide mononitrate (IMDUR) 30 MG 24 hr tablet Take 1 tablet (30 mg total) by mouth daily.  Marland Kitchen lisinopril (PRINIVIL,ZESTRIL) 40 MG tablet Take 40 mg by mouth daily. Reported on 11/22/2015  . metoprolol tartrate (LOPRESSOR) 25 MG tablet Take 12.5 mg by mouth 2 (two) times daily.  Marland Kitchen oxyCODONE (ROXICODONE) 15 MG immediate release tablet Take 1 tablet (15 mg total) by mouth every 6 (six) hours as needed for pain.  . pantoprazole (PROTONIX) 40 MG tablet Take 40 mg by mouth daily.  . polyethylene glycol (MIRALAX / GLYCOLAX) packet Take 17 g by mouth as needed.  . potassium chloride SA (K-DUR,KLOR-CON) 20 MEQ tablet Take 20 mEq by mouth 2 (two) times daily.   Marland Kitchen  simvastatin (ZOCOR) 40 MG tablet Take 40 mg by mouth daily.  Marland Kitchen umeclidinium bromide (INCRUSE ELLIPTA) 62.5 MCG/INH AEPB Inhale 1 puff into the lungs daily.   No facility-administered encounter medications on file as of 02/11/2018.      Review of Systems  Immunization History  Administered Date(s) Administered  . DTP 05/28/2001  . Influenza Split 04/28/2017  . Influenza-Unspecified 05/26/2013, 05/04/2014, 04/29/2016  . Pneumococcal-Unspecified 07/28/2009, 05/06/2016  . Tdap 12/30/2012   Pertinent  Health Maintenance Due  Topic Date Due  . COLONOSCOPY  03/14/2018 (Originally 11/24/2006)  . INFLUENZA VACCINE  02/25/2018   Fall Risk  04/06/2017 12/30/2012  Falls in the past year? No No   Functional Status Survey:    Vitals:   02/11/18 1049  BP: 120/80  Pulse: 84  Resp: 20  Temp: 98 F (36.7 C)  TempSrc: Oral  SpO2: 98%    Weight: 225 lb 9.6 oz (102.3 kg)  Height: 5\' 8"  (1.727 m)   Body mass index is 34.3 kg/m. Physical Exam  Labs reviewed: Recent Labs    09/29/17 0710 11/11/17 0825 12/17/17 0753  NA 138 137 132*  K 4.3 4.0 4.6  CL 97* 93* 90*  CO2 30 31 33*  GLUCOSE 120* 122* 120*  BUN 46* 38* 46*  CREATININE 1.86* 1.71* 1.92*  CALCIUM 9.2 8.9 8.7*   Recent Labs    02/24/17 0727 04/07/17 0700  AST 14* 15  ALT 13* 12*  ALKPHOS 50 52  BILITOT 0.6 0.4  PROT 7.0 7.4  ALBUMIN 3.9 4.0   Recent Labs    02/24/17 0727 04/07/17 0700 07/07/17 0723 08/19/17 0702 11/11/17 0825  WBC 6.9 6.4 7.2 7.5 5.5  NEUTROABS 4.5 4.0 4.6  --   --   HGB 15.7 15.4 13.7 15.4 15.1  HCT 47.7 48.1 46.2 52.8* 48.7  MCV 90.7 90.9 82.2 82.5 83.7  PLT 149* 161 211 217 147*   Lab Results  Component Value Date   TSH 3.404 04/07/2017   Lab Results  Component Value Date   HGBA1C 5.6 10/24/2016   Lab Results  Component Value Date   CHOL 135 08/19/2017   HDL 31 (L) 08/19/2017   LDLCALC 74 08/19/2017   TRIG 150 (H) 08/19/2017   CHOLHDL 4.4 08/19/2017    Significant Diagnostic Results in last 30 days:  No results found.  Assessment/Plan There are no diagnoses linked to this encounter.   Family/ staff Communication:   Labs/tests ordered:

## 2018-02-11 NOTE — Telephone Encounter (Signed)
RX Fax for [facility]@ [phone]  

## 2018-03-08 ENCOUNTER — Non-Acute Institutional Stay (SKILLED_NURSING_FACILITY): Payer: Medicare Other | Admitting: Internal Medicine

## 2018-03-08 ENCOUNTER — Encounter: Payer: Self-pay | Admitting: Internal Medicine

## 2018-03-08 DIAGNOSIS — F411 Generalized anxiety disorder: Secondary | ICD-10-CM | POA: Diagnosis not present

## 2018-03-08 DIAGNOSIS — I5032 Chronic diastolic (congestive) heart failure: Secondary | ICD-10-CM

## 2018-03-08 DIAGNOSIS — I739 Peripheral vascular disease, unspecified: Secondary | ICD-10-CM | POA: Diagnosis not present

## 2018-03-08 DIAGNOSIS — Z9861 Coronary angioplasty status: Secondary | ICD-10-CM

## 2018-03-08 DIAGNOSIS — J42 Unspecified chronic bronchitis: Secondary | ICD-10-CM | POA: Diagnosis not present

## 2018-03-08 DIAGNOSIS — I2510 Atherosclerotic heart disease of native coronary artery without angina pectoris: Secondary | ICD-10-CM

## 2018-03-08 DIAGNOSIS — G8929 Other chronic pain: Secondary | ICD-10-CM

## 2018-03-08 DIAGNOSIS — I10 Essential (primary) hypertension: Secondary | ICD-10-CM | POA: Diagnosis not present
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
## 2018-03-24 ENCOUNTER — Other Ambulatory Visit: Payer: Self-pay

## 2018-03-24 MED ORDER — OXYCODONE HCL 15 MG PO TABS: 15.0000 mg | ORAL_TABLET | Freq: Four times a day (QID) | ORAL | 0 refills | Status: AC | PRN

## 2018-03-24 NOTE — Telephone Encounter (Signed)
RX Fax for [facility]@ [phone]  

## 2018-03-31 ENCOUNTER — Encounter: Payer: Self-pay | Admitting: Internal Medicine

## 2018-03-31 ENCOUNTER — Non-Acute Institutional Stay (SKILLED_NURSING_FACILITY): Payer: Medicare Other | Admitting: Internal Medicine

## 2018-03-31 DIAGNOSIS — I10 Essential (primary) hypertension: Secondary | ICD-10-CM

## 2018-03-31 DIAGNOSIS — I2510 Atherosclerotic heart disease of native coronary artery without angina pectoris: Secondary | ICD-10-CM

## 2018-03-31 DIAGNOSIS — I739 Peripheral vascular disease, unspecified: Secondary | ICD-10-CM | POA: Diagnosis not present

## 2018-03-31 DIAGNOSIS — J42 Unspecified chronic bronchitis: Secondary | ICD-10-CM | POA: Diagnosis not present

## 2018-03-31 DIAGNOSIS — I5032 Chronic diastolic (congestive) heart failure: Secondary | ICD-10-CM

## 2018-03-31 DIAGNOSIS — Z9861 Coronary angioplasty status: Secondary | ICD-10-CM

## 2018-03-31 NOTE — Progress Notes (Signed)
Location:    [facility] Room Number: 107/W Place of Service:  SNF [phone])  Provider: Granville Lewis PA-C  PCP: [name], MD Patient Care Team: [name], MD as PCP - General (Internal Medicine)  Extended Emergency Contact Information Primary Emergency Contact: Harvel Quale, [place] [address] [place] Mobile Phone: [phone] Relation: Daughter   Code Status: Full Code Goals of care:  Advanced Directive information Advanced Directives 03/31/2018  Does Patient Have a Medical Advance Directive? Yes  Type of Advance Directive (No Data)  Does patient want to make changes to medical advance directive? No - Patient declined  Copy of [place] in Chart? No - copy requested  Would patient like information on creating a medical advance directive? -  Pre-existing out of facility DNR order (yellow form or pink MOST form) -     Allergies  Allergen Reactions  . Codeine Nausea Only    Chief Complaint  Patient presents with  . Discharge Note    Discharge Visit    HPI:  62 y.o. male seen today for discharge from facility he will be going to another facility in [place].  He appears to be stable he does not have any complaints today which is his baseline.  We have asked about doing updated labs but he fairly consistently refuses these saying he is doing just fine.  He does not have any complaints today.  He does have a fairly extensive medical history including peripheral vascular disease status post right AKA he is on aspirin and Plavix and has been followed by vascular--he also has a prosthesis which he usually wears when he goes out of the facility he is status post left femoral endarterectomy in the past.  He also has a history of coronary artery disease and in addition of Plavix and aspirin is on a statin LDL was 74 on the lab done this past January he has been essentially asymptomatic he is also on  Imdur.   He also has a history of hypertension is on numerous agents including lisinopril 40 mg a day- Lopressor 12.5 mg twice daily and clonidine 0.2 mg twice daily- at times he will have transitory elevated blood pressures but these appear to come down- I got 160/88 today but he has just been outside he continues to smoke and suspect this contributed to it-previous listed blood pressures show systolics [id number] again there does not appear to be consistent elevations.  Regards to diastolic CHF his weight has been relatively stable between 220 and 230 pounds is on Lasix 40 mg twice a day with potassium supplementation-we have tried to get updated labs but he has fairly consistently refused this since early this spring-saying he is feels just fine I did tell him that when he goes to the new facility most likely he will need to get updated lab-I did ask about obtaining labs before he left and he again refused.  He does have a history of renal insufficiency last creatinine was going a bit above his baseline at 1.92 again he has refused lab--but I did discuss that he most likely will need to get labs when he goes to the new facility and he appeared to express understanding.  He does have a history of COPD he continues to smoke- and has been quite adamant about continuing this- he continues on Symbicort and Incruse Ellipta continues to have some baseline congestion rhonchi on  exams. But denies shortness of breath or any discomfort  He also has a history of osteoarthritis especially of his shoulders he is on oxycodone 15 mg every 6 hours- at one point he was cut back to every 8 hours but he complained he was having significantly increased pain and thus was changed back-he appears to tolerate this well although we at times have tried to titrate this down.  He also has a history of anxiety is on Xanax 0.5 mg at night and again is quite adamant he needs this-.   Of note he does have a previous history of a  partial parathyroidectomy- biopsy at that time showed clear margins suggestion for follow-up but he has declined follow-up on this as well stating he is doing just fine  He also has been asked about colorectal cancer screening even using possibly Cologuard instead of a colonoscopy -but he has refused this as well  Currently he appears to be stable and at his baseline again he does continue to smoke- he does not have any complaints today-      Past Medical History:  Diagnosis Date  . Anemia, unspecified   . Arthritis    "shoulders" (09/22/2014)  . Asthma   . Atrial fibrillation ([facility])   . Carotid artery occlusion   . CHF (congestive heart failure) ([facility])   . COPD 11/27/2007   Qualifier: Diagnosis of  By: Garen Grams    . Coronary artery disease    Cardiac catheterization in 2008 showed 99% stenosis in proximal left circumflex which was treated with Taxus drug-eluting stent. There was mild RCA and LAD disease with normal ejection fraction.  Marland Kitchen ERECTILE DYSFUNCTION 11/27/2007   Qualifier: Diagnosis of  By: Garen Grams    . Gastric ulcer, unspecified as acute or chronic, without mention of hemorrhage, perforation, or obstruction   . Heart murmur   . HYPERLIPIDEMIA 11/27/2007   Qualifier: Diagnosis of  By: Garen Grams    . Hypertension   . HYPERTENSION, BENIGN 05/23/2009   Qualifier: Diagnosis of  By: Melvyn Novas MD, Christena Deem   . Other severe protein-calorie malnutrition   . PAD (peripheral artery disease) (H[facility])    Previous right SFA stent in 2003. Directional atherectomy right SFA in 01/2013  . Pneumonia 2000  . Pressure ulcer, lower back(707.03)   . Tobacco use   . Traumatic amputation of leg(s) (complete) (partial), unilateral, at or above knee, without mention of complication     Past Surgical History:  Procedure Laterality Date  . ABDOMINAL AORTAGRAM N/A 02/23/2013   Procedure: ABDOMINAL Maxcine Ham;  Surgeon: Wellington Hampshire, MD;  Location: [facility] CATH LAB;  Service:  Cardiovascular;  Laterality: N/A;  . ABDOMINAL AORTAGRAM N/A 09/22/2014   Procedure: ABDOMINAL Maxcine Ham;  Surgeon: Elam Dutch, MD;  Location: [facility] CATH LAB;  Service: Cardiovascular;  Laterality: N/A;  . ACNE CYST REMOVAL    . AMPUTATION Right 09/09/2013   Procedure: AMPUTATION ABOVE KNEE;  Surgeon: Rosetta Posner, MD;  Location: [facility];  Service: Vascular;  Laterality: Right;  . ATHERECTOMY N/A 03/02/2013   Procedure: ATHERECTOMY;  Surgeon: Wellington Hampshire, MD;  Location: [facility] CATH LAB;  Service: Cardiovascular;  Laterality: N/A;  . CARDIAC CATHETERIZATION N/A 09/03/2015   Procedure: Left Heart Cath and Coronary Angiography;  Surgeon: Leonie Man, MD;  Location: [facility] CV LAB;  Service: Cardiovascular;  Laterality: N/A;  . CARDIAC CATHETERIZATION N/A 09/03/2015   Procedure: Coronary Balloon Angioplasty;  Surgeon: Leonie Man, MD;  Location: [facility]  INVASIVE CV LAB;  Service: Cardiovascular;  Laterality: N/A;  . CORONARY ANGIOPLASTY  09/03/2015  . ENDARTERECTOMY Left 08/16/2013   Procedure: Exploration of Left Neck/Fix Bleeding;  Surgeon: Elam Dutch, MD;  Location: [facility];  Service: Vascular;  Laterality: Left;  . ENDARTERECTOMY FEMORAL Left 01/30/2016   Procedure: ENDARTERECTOMY LEFT FEMORAL ARTERY;  Surgeon: Elam Dutch, MD;  Location: [facility] OR;  Service: Vascular;  Laterality: Left;  . ESOPHAGOGASTRODUODENOSCOPY N/A 09/08/2013   Procedure: ESOPHAGOGASTRODUODENOSCOPY (EGD);  Surgeon: Cleotis Nipper, MD;  Location: [facility] ENDOSCOPY;  Service: Endoscopy;  Laterality: N/A;  . ESOPHAGOGASTRODUODENOSCOPY (EGD) WITH PROPOFOL N/A 01/05/2014   Procedure: ESOPHAGOGASTRODUODENOSCOPY (EGD) WITH PROPOFOL;  Surgeon: Cleotis Nipper, MD;  Location: [facility] ENDOSCOPY;  Service: Endoscopy;  Laterality: N/A;  . FEMORAL ARTERY STENT Right 2003   [name] 09/22/2014  . FLEXIBLE SIGMOIDOSCOPY N/A 09/08/2013   Procedure: FLEXIBLE SIGMOIDOSCOPY;  Surgeon: Cleotis Nipper, MD;  Location: [facility] ENDOSCOPY;  Service:  Endoscopy;  Laterality: N/A;  unprepp  . ILIAC ARTERY STENT Left 09/22/2014  . NASAL SINUS SURGERY  2004  . PAROTIDECTOMY Right 03/29/2014   Procedure: PAROTIDECTOMY;  Surgeon: Ascencion Dike, MD;  Location: [facility];  Service: ENT;  Laterality: Right;  . PATCH ANGIOPLASTY Left 01/30/2016   Procedure: LEFT FEMORAL ARTYERY PATCH ANGIOPLASTY USING HEMASHIELD PLATINUM FINESSE PATCH;  Surgeon: Elam Dutch, MD;  Location: [facility];  Service: Vascular;  Laterality: Left;  . PERIPHERAL VASCULAR CATHETERIZATION N/A 08/17/2015   Procedure: Abdominal Aortogram;  Surgeon: Elam Dutch, MD;  Location: [facility] CV LAB;  Service: Cardiovascular;  Laterality: N/A;      reports that he has been smoking cigarettes. He has a 205.00 pack-year smoking history. He has never used smokeless tobacco. He reports that he does not drink alcohol or use drugs. Social History   Socioeconomic History  . Marital status: Single    Spouse name: Not on file  . Number of children: Not on file  . Years of education: 77  . Highest education level: Not on file  Occupational History  . Occupation: Lawyer: [employer]  Social Needs  . Financial resource strain: Not on file  . Food insecurity:    Worry: Not on file    Inability: Not on file  . Transportation needs:    Medical: Not on file    Non-medical: Not on file  Tobacco Use  . Smoking status: Current Every Day Smoker    Packs/day: 5.00    Years: 41.00    Pack years: 205.00    Types: Cigarettes  . Smokeless tobacco: Never Used  . Tobacco comment: stated 1/2-3/4 ppd  Substance and Sexual Activity  . Alcohol use: No    Alcohol/week: 0.0 standard drinks    Comment: none since moving to nursing home in March 2015  . Drug use: No  . Sexual activity: Not Currently  Lifestyle  . Physical activity:    Days per week: Not on file    Minutes per session: Not on file  . Stress: Not on file  Relationships  . Social connections:    Talks on  phone: Not on file    Gets together: Not on file    Attends religious service: Not on file    Active member of club or organization: Not on file    Attends meetings of clubs or organizations: Not on file    Relationship status: Not on file  . Intimate partner violence:  Fear of current or ex partner: Not on file    Emotionally abused: Not on file    Physically abused: Not on file    Forced sexual activity: Not on file  Other Topics Concern  . Not on file  Social History Narrative   ** Merged History Encounter **       Regular exercise-no   Caffeine Use-yes   Functional Status Survey:    Allergies  Allergen Reactions  . Codeine Nausea Only    Pertinent  Health Maintenance Due  Topic Date Due  . INFLUENZA VACCINE  04/08/2018 (Originally 02/25/2018)  . COLONOSCOPY  04/30/2018 (Originally 11/24/2006)    Medications: Outpatient Encounter Medications as of 03/31/2018  Medication Sig  . ALPRAZolam (XANAX) 0.5 MG tablet Take 1 tablet (0.5 mg total) by mouth daily. From 10 PM X 90 days  . aspirin EC 81 MG tablet Take 1 tablet (81 mg total) by mouth daily.  . budesonide-formoterol (SYMBICORT) 80-4.5 MCG/ACT inhaler Inhale 2 puffs into the lungs 2 (two) times daily.  . cloNIDine (CATAPRES) 0.2 MG tablet Take 0.2 mg by mouth 2 (two) times daily.  . clopidogrel (PLAVIX) 75 MG tablet Take 75 mg by mouth daily. @@ 9:00 pm  . colchicine 0.6 MG tablet Take 0.3 mg by mouth daily.   . furosemide (LASIX) 40 MG tablet Take 40 mg by mouth 2 (two) times daily.   . isosorbide mononitrate (IMDUR) 30 MG 24 hr tablet Take 1 tablet (30 mg total) by mouth daily.  Marland Kitchen lisinopril (PRINIVIL,ZESTRIL) 40 MG tablet Take 40 mg by mouth daily. Reported on 11/22/2015  . metoprolol tartrate (LOPRESSOR) 25 MG tablet Take 12.5 mg by mouth 2 (two) times daily.  Marland Kitchen oxyCODONE (ROXICODONE) 15 MG immediate release tablet Take 1 tablet (15 mg total) by mouth every 6 (six) hours as needed for pain.  . pantoprazole  (PROTONIX) 40 MG tablet Take 40 mg by mouth daily.  . polyethylene glycol (MIRALAX / GLYCOLAX) packet Take 17 g by mouth as needed.  . potassium chloride SA (K-DUR,KLOR-CON) 20 MEQ tablet Take 20 mEq by mouth 2 (two) times daily.   . simvastatin (ZOCOR) 40 MG tablet Take 40 mg by mouth daily.  Marland Kitchen umeclidinium bromide (INCRUSE ELLIPTA) 62.5 MCG/INH AEPB Inhale 1 puff into the lungs daily.   No facility-administered encounter medications on file as of 03/31/2018.      Review of Systems   In general is not complaining of any fever chills weight appears to be relatively stable.  Skin is not complain of rashes or itching does have some chronic dermatitis blisters of his left leg which has been followed by dermatology.  Head ears eyes nose mouth and throat is not complain of visual changes or sore throat.  Respiratory continues to smoke but denies shortness of breath has some chronic congestion.  Cardiac is not complaining of chest pain has mild left lower extremity edema does wear compression device.  GI is not complaining of abdominal pain nausea vomiting diarrhea constipation continues to have a very good appetite.  GU is not complaining of dysuria.  Musculoskeletal does have chronic pain more so of his shoulders with history of osteoarthritis is on oxycodone 15 mg every 6 hours routine.  Neurologic is not complaining of dizziness headache or numbness or syncope.  Psych does have a history of anxiety does receive Xanax at night- he does not complain of being depressed --says he is going to try to have a positive attitude about his change  of location     Vitals:   03/31/18 1353  BP: (!) 154/82  Pulse: 76  Resp: 20  Temp: 98.4 F (36.9 C)  TempSrc: Oral   Again manual blood pressure was 160/88.  Weight is 231 pounds Physical Exam   In general this is a pleasant middle-age male in no distress sitting comfortably in his wheelchair.  His skin is warm and dry he does have  baseline sun exposure changes with some blisters of hisleft lower extremity which appear to wax and wane at times.  Eyes visual acuity appears to be intact sclera and conjunctive are clear.  Oropharynx is clear mucous membranes moist he does have extractions.  Chest he does have scattered rhonchi at baseline there is no labored breathing.    Abdomen is somewhat obese soft nontender with positive bowel sounds.  PEG    Musculoskeletal he is status post right above-the-knee amputation- does have excellent strength upper extremities and left lower extremity he does have a compression device on for the edema on the left.  Continues to ambulate well in his wheelchair.  Neurologic is grossly intact his speech is clear no lateralizing findings.  Psych he is alert and oriented pleasant and appropriate  Labs reviewed: Basic Metabolic Panel: Recent Labs    09/29/17 0710 11/11/17 0825 12/17/17 0753  NA 138 137 132*  K 4.3 4.0 4.6  CL 97* 93* 90*  CO2 30 31 33*  GLUCOSE 120* 122* 120*  BUN 46* 38* 46*  CREATININE 1.86* 1.71* 1.92*  CALCIUM 9.2 8.9 8.7*   Liver Function Tests: Recent Labs    04/07/17 0700  AST 15  ALT 12*  ALKPHOS 52  BILITOT 0.4  PROT 7.4  ALBUMIN 4.0   No results for input(s): LIPASE, AMYLASE in the last 8760 hours. No results for input(s): AMMONIA in the last 8760 hours. CBC: Recent Labs    04/07/17 0700 07/07/17 0723 08/19/17 0702 11/11/17 0825  WBC 6.4 7.2 7.5 5.5  NEUTROABS 4.0 4.6  --   --   HGB 15.4 13.7 15.4 15.1  HCT 48.1 46.2 52.8* 48.7  MCV 90.9 82.2 82.5 83.7  PLT 161 211 217 147*   Cardiac Enzymes: No results for input(s): CKTOTAL, CKMB, CKMBINDEX, TROPONINI in the last 8760 hours. BNP: Invalid input(s): POCBNP CBG: No results for input(s): GLUCAP in the last 8760 hours.  Procedures and Imaging Studies During Stay: No results found.  Assessment/Plan:    #1- history of peripheral vascular disease status post right  above-the-knee amputation- he does have a previous history of a left femoral endarterectomy- and has been followed by vascular.  He appears to be doing fairly well on aspirin and Plavix he is also on a statin- has a prosthesis he wears when he goes out.  LDL was in the 70s on lab done in January.  2.  Hypertension he has variable blood pressures but I do not see consistent elevations continues on --Clonodine-Lopressor and Lisinopril  #3-coronary artery disease status post PTCA again he is on aspirin Plavix and statin LDL was 74 on lab done in January-- Also on Imdur    4.  History of diastolic CHF-he is on Lasix 40 mg twice daily- weight appears to be relatively stable clinically appears to be stable-  At this point refusing labs as noted above  5- history of renal insufficiency creatinine on most recent lab in May was 1.92 with a BUN of 46- again patient has deferred additional labs at this time  I note sodium was slightly low at 132 as well-clinically he continues to say he is just fine.     #6-history of COPD- he does continue to smoke-and has been adamant about continuing this- he is on Symbicort and Incruse  Ellipta- this appears to be helping- continues to have some rhonchi on exam at times however.  He is not complaining of any increased cough or shortness of breath    7.-History of carotid stenosis- he is on aspirin and Plavix-  #8 history of osteoarthritis-  he is now on oxycodone   15  mg every 6 hours- he is adamant that he needs this--at one point had cut back to every 8 hours but he did complain that he was having increased pain ---- And was changed back to every 6.  9.-History of anxiety-he is on Xanax 0.5 mg at night s at one point had an additional dose if he had continued anxiety insomnia but this has been discontinued--we have been attempting to try to minimize his narcotic and benzodiazepine use but has been insistent he needs current doses  #10- history  of partial parathyroidectomy- biopsy showed clear margins- there was suggestion for follow-up but patient has declined this stating he is doing fine   #11 history of colorectal cancer screening- we have discussed  possibility of Cologuard but he was quite adamant he does not want to do - previously refused colonoscopy-    Again, he will be going to another facility tomorrow---I did tell him at some point he will need updated labs and he expressed understanding--that likely new PCP will request them---clinically he appears stable and at baseline-- [id number]--of note greater than 30 minutes spent on this discharge summary--greater than 50 percent of time spent coordinating plan of care for numerous diagnoses---

## 2018-04-01 DIAGNOSIS — N183 Chronic kidney disease, stage 3 (moderate): Secondary | ICD-10-CM | POA: Diagnosis not present

## 2018-04-01 DIAGNOSIS — Z89619 Acquired absence of unspecified leg above knee: Secondary | ICD-10-CM | POA: Diagnosis not present

## 2018-04-01 DIAGNOSIS — I10 Essential (primary) hypertension: Secondary | ICD-10-CM | POA: Diagnosis not present

## 2018-04-01 DIAGNOSIS — F17200 Nicotine dependence, unspecified, uncomplicated: Secondary | ICD-10-CM | POA: Diagnosis not present

## 2018-04-01 DIAGNOSIS — F411 Generalized anxiety disorder: Secondary | ICD-10-CM | POA: Diagnosis not present

## 2018-04-01 DIAGNOSIS — I509 Heart failure, unspecified: Secondary | ICD-10-CM | POA: Diagnosis not present

## 2018-04-01 DIAGNOSIS — I739 Peripheral vascular disease, unspecified: Secondary | ICD-10-CM | POA: Diagnosis not present

## 2018-04-01 DIAGNOSIS — M1990 Unspecified osteoarthritis, unspecified site: Secondary | ICD-10-CM | POA: Diagnosis not present

## 2018-04-01 DIAGNOSIS — J449 Chronic obstructive pulmonary disease, unspecified: Secondary | ICD-10-CM | POA: Diagnosis not present

## 2018-04-01 DIAGNOSIS — I2510 Atherosclerotic heart disease of native coronary artery without angina pectoris: Secondary | ICD-10-CM | POA: Diagnosis not present

## 2018-04-02 DIAGNOSIS — M6281 Muscle weakness (generalized): Secondary | ICD-10-CM | POA: Diagnosis not present

## 2018-04-02 DIAGNOSIS — I739 Peripheral vascular disease, unspecified: Secondary | ICD-10-CM | POA: Diagnosis not present

## 2018-04-02 DIAGNOSIS — J449 Chronic obstructive pulmonary disease, unspecified: Secondary | ICD-10-CM | POA: Diagnosis not present

## 2018-04-04 IMAGING — DX DG SHOULDER 2+V*R*
3 series · 3 of 3 positions shown · non-contrast
Comparison: 03/06/2014

CLINICAL DATA: Chronic right shoulder pain, initial encounter, no
known injury

EXAM:
RIGHT SHOULDER - 2+ VIEW

[shoulder grashey]
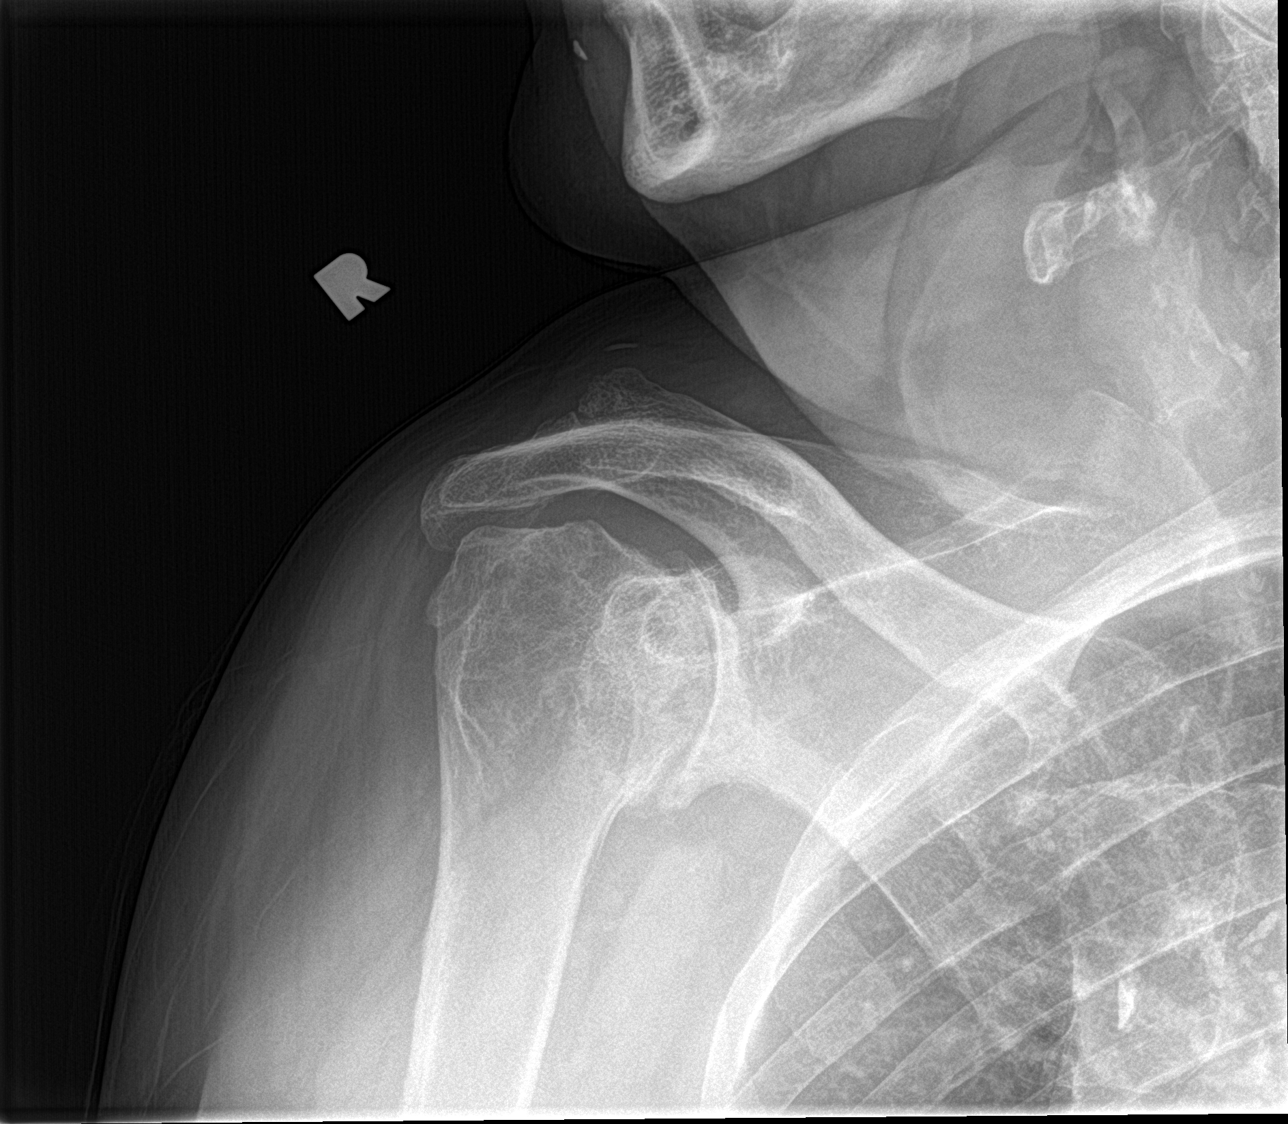

[shoulder y view]
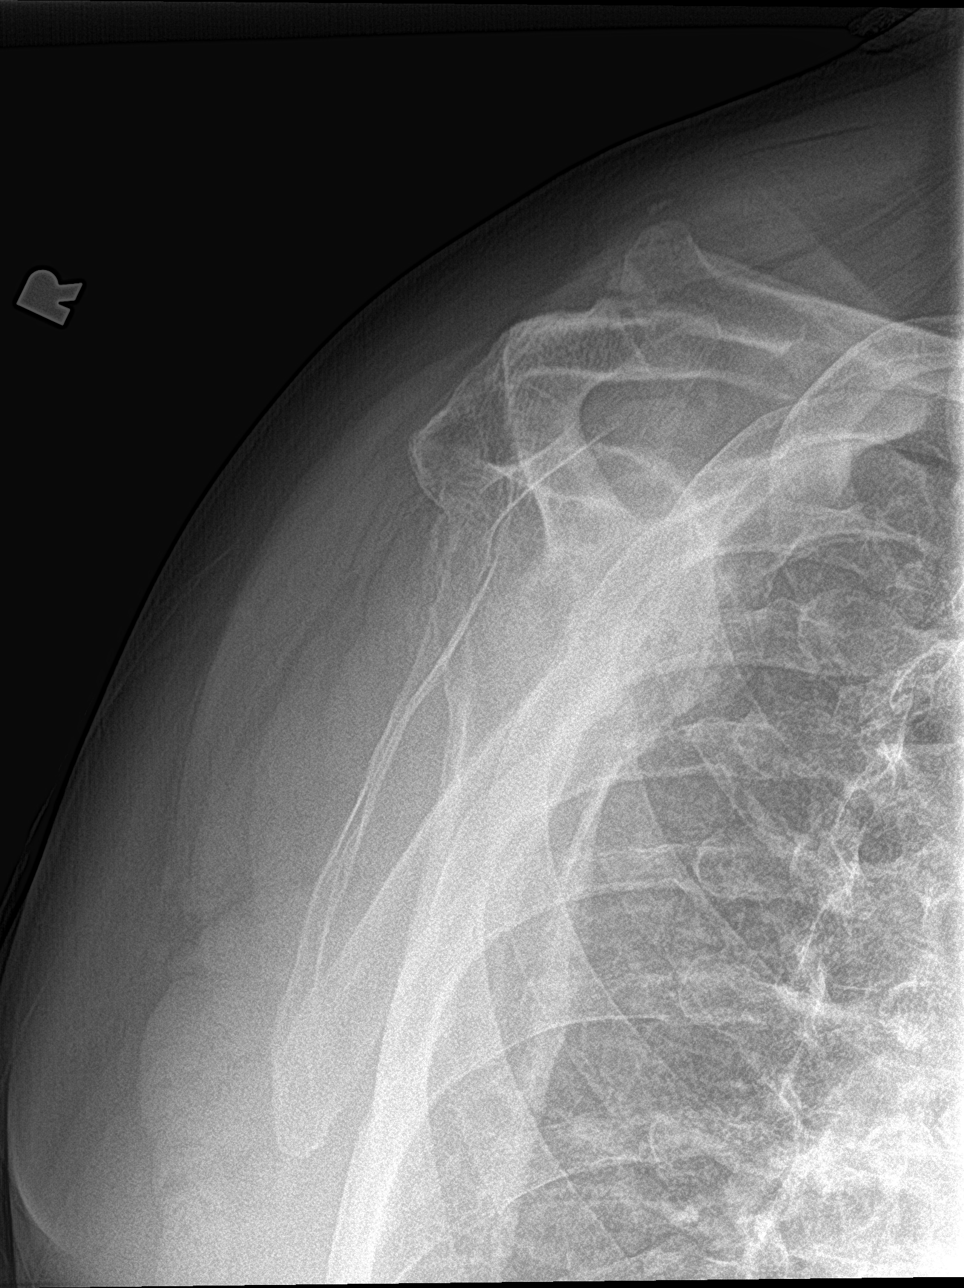

[shoulder axillary]
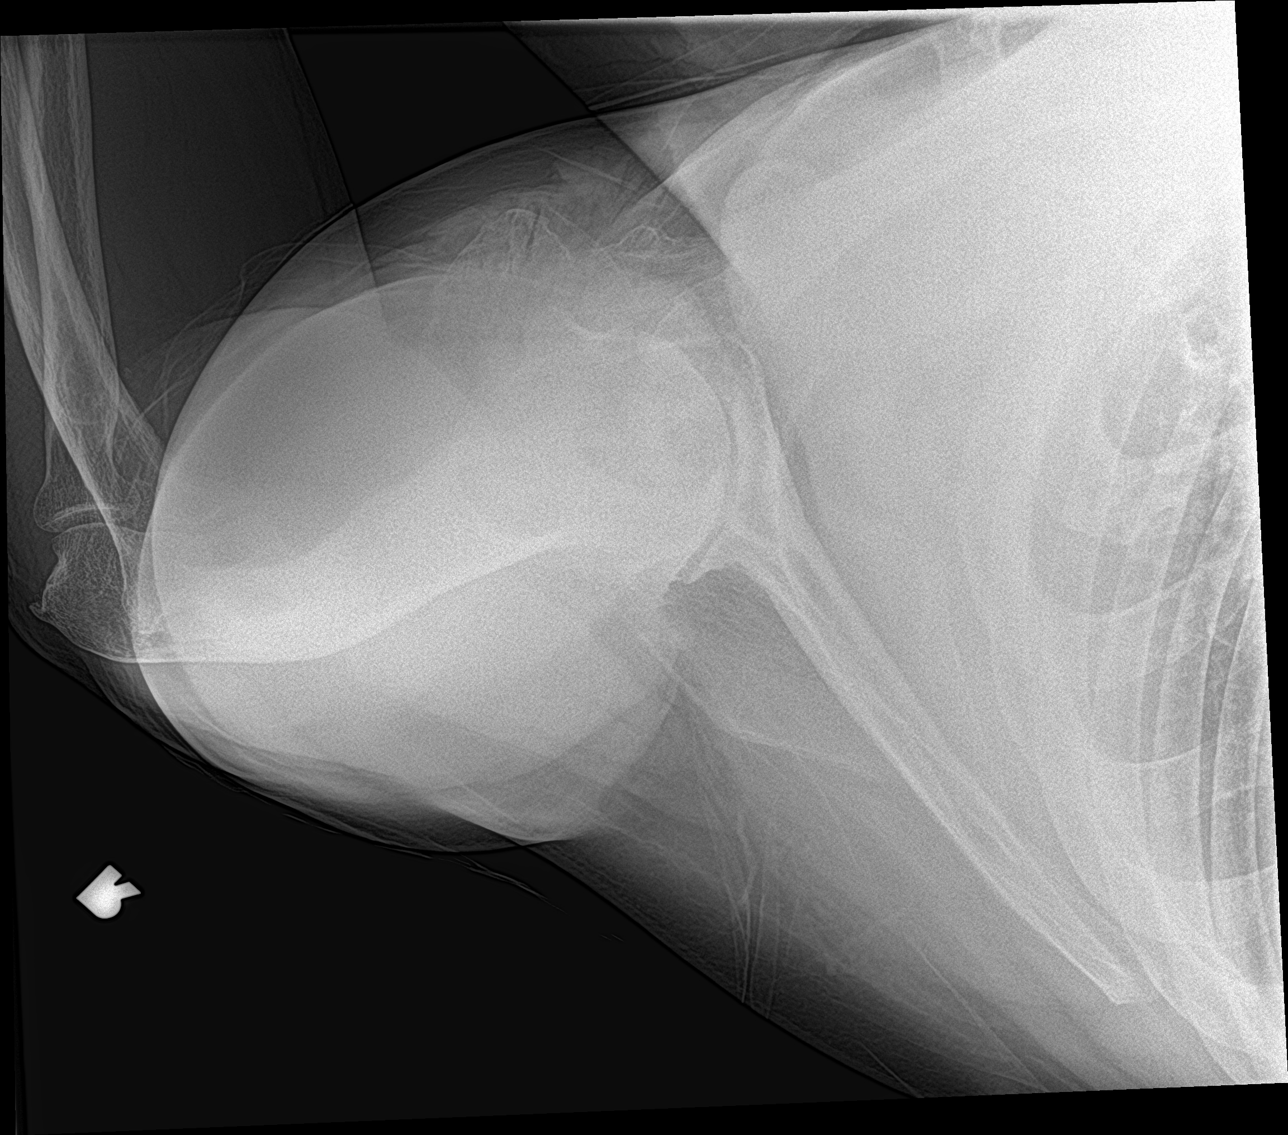

[3 of 3 positions shown; findings below may reference images not displayed]

FINDINGS: Degenerative changes of the glenohumeral articulation are again seen
and relatively stable. Degenerative changes of the acromioclavicular
joint are noted. Old healed right fourth rib fracture is noted. No
other focal abnormality is seen.
IMPRESSION: Degenerative change without acute abnormality.

## 2018-04-12 ENCOUNTER — Encounter: Payer: Self-pay | Admitting: Internal Medicine

## 2018-04-12 DIAGNOSIS — I10 Essential (primary) hypertension: Secondary | ICD-10-CM | POA: Diagnosis not present

## 2018-04-13 NOTE — Progress Notes (Signed)
This encounter was created in error - please disregard.

## 2018-04-22 DIAGNOSIS — R062 Wheezing: Secondary | ICD-10-CM | POA: Diagnosis not present

## 2018-04-22 DIAGNOSIS — R05 Cough: Secondary | ICD-10-CM | POA: Diagnosis not present

## 2018-04-22 DIAGNOSIS — R0989 Other specified symptoms and signs involving the circulatory and respiratory systems: Secondary | ICD-10-CM | POA: Diagnosis not present

## 2018-04-22 DIAGNOSIS — R4182 Altered mental status, unspecified: Secondary | ICD-10-CM | POA: Diagnosis not present

## 2018-04-23 DIAGNOSIS — Z79899 Other long term (current) drug therapy: Secondary | ICD-10-CM | POA: Diagnosis not present

## 2018-05-24 DIAGNOSIS — Z79899 Other long term (current) drug therapy: Secondary | ICD-10-CM | POA: Diagnosis not present

## 2018-05-24 DIAGNOSIS — I10 Essential (primary) hypertension: Secondary | ICD-10-CM | POA: Diagnosis not present

## 2018-05-27 DIAGNOSIS — F0390 Unspecified dementia without behavioral disturbance: Secondary | ICD-10-CM | POA: Diagnosis not present

## 2018-05-27 DIAGNOSIS — G8929 Other chronic pain: Secondary | ICD-10-CM | POA: Diagnosis not present

## 2018-05-27 DIAGNOSIS — I2510 Atherosclerotic heart disease of native coronary artery without angina pectoris: Secondary | ICD-10-CM | POA: Diagnosis not present

## 2018-05-27 DIAGNOSIS — I10 Essential (primary) hypertension: Secondary | ICD-10-CM | POA: Diagnosis not present

## 2018-05-27 DIAGNOSIS — I739 Peripheral vascular disease, unspecified: Secondary | ICD-10-CM | POA: Diagnosis not present

## 2018-05-27 DIAGNOSIS — Z89619 Acquired absence of unspecified leg above knee: Secondary | ICD-10-CM | POA: Diagnosis not present

## 2018-05-27 DIAGNOSIS — N183 Chronic kidney disease, stage 3 (moderate): Secondary | ICD-10-CM | POA: Diagnosis not present

## 2018-05-27 DIAGNOSIS — F411 Generalized anxiety disorder: Secondary | ICD-10-CM | POA: Diagnosis not present

## 2018-05-27 DIAGNOSIS — I509 Heart failure, unspecified: Secondary | ICD-10-CM | POA: Diagnosis not present

## 2018-05-27 DIAGNOSIS — J449 Chronic obstructive pulmonary disease, unspecified: Secondary | ICD-10-CM | POA: Diagnosis not present

## 2018-06-08 DIAGNOSIS — I509 Heart failure, unspecified: Secondary | ICD-10-CM | POA: Diagnosis not present

## 2018-06-08 DIAGNOSIS — I517 Cardiomegaly: Secondary | ICD-10-CM | POA: Diagnosis not present

## 2018-06-08 DIAGNOSIS — Q2546 Tortuous aortic arch: Secondary | ICD-10-CM | POA: Diagnosis not present

## 2018-06-09 DIAGNOSIS — I499 Cardiac arrhythmia, unspecified: Secondary | ICD-10-CM | POA: Diagnosis not present

## 2018-06-09 DIAGNOSIS — E162 Hypoglycemia, unspecified: Secondary | ICD-10-CM | POA: Diagnosis not present

## 2018-06-09 DIAGNOSIS — I469 Cardiac arrest, cause unspecified: Secondary | ICD-10-CM | POA: Diagnosis not present

## 2018-06-09 DIAGNOSIS — E161 Other hypoglycemia: Secondary | ICD-10-CM | POA: Diagnosis not present

## 2018-06-15 NOTE — Progress Notes (Deleted)
This encounter was created in error - please disregard.

## 2018-06-15 NOTE — Progress Notes (Signed)
This encounter was created in error - please disregard.

## 2018-06-27 DIAGNOSIS — 419620001 Death: Secondary | SNOMED CT | POA: Diagnosis not present

## 2018-06-27 DEATH — deceased
